# Patient Record
Sex: Female | Born: 1945
Health system: Southern US, Community
[De-identification: ages and names within clinical notes are randomized; demographics above are authoritative.]

## PROBLEM LIST (undated history)

## (undated) DIAGNOSIS — M47817 Spondylosis without myelopathy or radiculopathy, lumbosacral region: Secondary | ICD-10-CM

## (undated) DIAGNOSIS — R32 Unspecified urinary incontinence: Secondary | ICD-10-CM

## (undated) DIAGNOSIS — C801 Malignant (primary) neoplasm, unspecified: Secondary | ICD-10-CM

## (undated) DIAGNOSIS — I4891 Unspecified atrial fibrillation: Secondary | ICD-10-CM

## (undated) DIAGNOSIS — E039 Hypothyroidism, unspecified: Secondary | ICD-10-CM

## (undated) DIAGNOSIS — Z8489 Family history of other specified conditions: Secondary | ICD-10-CM

## (undated) DIAGNOSIS — I779 Disorder of arteries and arterioles, unspecified: Secondary | ICD-10-CM

## (undated) DIAGNOSIS — L814 Other melanin hyperpigmentation: Secondary | ICD-10-CM

## (undated) DIAGNOSIS — I340 Nonrheumatic mitral (valve) insufficiency: Secondary | ICD-10-CM

## (undated) DIAGNOSIS — E785 Hyperlipidemia, unspecified: Secondary | ICD-10-CM

## (undated) DIAGNOSIS — E119 Type 2 diabetes mellitus without complications: Secondary | ICD-10-CM

## (undated) DIAGNOSIS — K219 Gastro-esophageal reflux disease without esophagitis: Secondary | ICD-10-CM

## (undated) DIAGNOSIS — A419 Sepsis, unspecified organism: Secondary | ICD-10-CM

## (undated) DIAGNOSIS — N329 Bladder disorder, unspecified: Secondary | ICD-10-CM

## (undated) DIAGNOSIS — D689 Coagulation defect, unspecified: Secondary | ICD-10-CM

## (undated) DIAGNOSIS — K2960 Other gastritis without bleeding: Secondary | ICD-10-CM

## (undated) DIAGNOSIS — I480 Paroxysmal atrial fibrillation: Secondary | ICD-10-CM

## (undated) DIAGNOSIS — Z860100 Personal history of colon polyps, unspecified: Secondary | ICD-10-CM

## (undated) DIAGNOSIS — Z8719 Personal history of other diseases of the digestive system: Secondary | ICD-10-CM

## (undated) DIAGNOSIS — T4145XA Adverse effect of unspecified anesthetic, initial encounter: Secondary | ICD-10-CM

## (undated) DIAGNOSIS — I509 Heart failure, unspecified: Secondary | ICD-10-CM

## (undated) DIAGNOSIS — D509 Iron deficiency anemia, unspecified: Secondary | ICD-10-CM

## (undated) DIAGNOSIS — D6861 Antiphospholipid syndrome: Secondary | ICD-10-CM

## (undated) DIAGNOSIS — T7840XA Allergy, unspecified, initial encounter: Secondary | ICD-10-CM

## (undated) DIAGNOSIS — I2699 Other pulmonary embolism without acute cor pulmonale: Secondary | ICD-10-CM

## (undated) DIAGNOSIS — I2510 Atherosclerotic heart disease of native coronary artery without angina pectoris: Secondary | ICD-10-CM

## (undated) DIAGNOSIS — I10 Essential (primary) hypertension: Secondary | ICD-10-CM

## (undated) DIAGNOSIS — I739 Peripheral vascular disease, unspecified: Secondary | ICD-10-CM

## (undated) DIAGNOSIS — R011 Cardiac murmur, unspecified: Secondary | ICD-10-CM

## (undated) DIAGNOSIS — I493 Ventricular premature depolarization: Secondary | ICD-10-CM

## (undated) DIAGNOSIS — Z889 Allergy status to unspecified drugs, medicaments and biological substances status: Secondary | ICD-10-CM

## (undated) DIAGNOSIS — T8859XA Other complications of anesthesia, initial encounter: Secondary | ICD-10-CM

## (undated) DIAGNOSIS — I82409 Acute embolism and thrombosis of unspecified deep veins of unspecified lower extremity: Secondary | ICD-10-CM

## (undated) HISTORY — DX: Heart failure, unspecified: I50.9

## (undated) HISTORY — DX: Paroxysmal atrial fibrillation: I48.0

## (undated) HISTORY — DX: Gastro-esophageal reflux disease without esophagitis: K21.9

## (undated) HISTORY — PX: BREAST SURGERY: SHX581

## (undated) HISTORY — DX: Personal history of colonic polyps: Z86.010

## (undated) HISTORY — DX: Atherosclerotic heart disease of native coronary artery without angina pectoris: I25.10

## (undated) HISTORY — DX: Essential (primary) hypertension: I10

## (undated) HISTORY — PX: TONSILLECTOMY: SUR1361

## (undated) HISTORY — PX: ABDOMINAL HYSTERECTOMY: SHX81

## (undated) HISTORY — DX: Iron deficiency anemia, unspecified: D50.9

## (undated) HISTORY — DX: Hyperlipidemia, unspecified: E78.5

## (undated) HISTORY — PX: TOTAL KNEE ARTHROPLASTY: SHX125

## (undated) HISTORY — PX: JOINT REPLACEMENT: SHX530

## (undated) HISTORY — DX: Other pulmonary embolism without acute cor pulmonale: I26.99

## (undated) HISTORY — DX: Coagulation defect, unspecified: D68.9

## (undated) HISTORY — DX: Ventricular premature depolarization: I49.3

## (undated) HISTORY — PX: OTHER SURGICAL HISTORY: SHX169

## (undated) HISTORY — DX: Disorder of arteries and arterioles, unspecified: I77.9

## (undated) HISTORY — PX: BACK SURGERY: SHX140

## (undated) HISTORY — DX: Unspecified atrial fibrillation: I48.91

## (undated) HISTORY — DX: Unspecified urinary incontinence: R32

## (undated) HISTORY — DX: Personal history of colon polyps, unspecified: Z86.0100

## (undated) HISTORY — DX: Antiphospholipid syndrome: D68.61

## (undated) HISTORY — DX: Sepsis, unspecified organism: A41.9

## (undated) HISTORY — DX: Peripheral vascular disease, unspecified: I73.9

## (undated) HISTORY — PX: CARDIAC CATHETERIZATION: SHX172

## (undated) HISTORY — DX: Allergy status to unspecified drugs, medicaments and biological substances: Z88.9

## (undated) HISTORY — DX: Spondylosis without myelopathy or radiculopathy, lumbosacral region: M47.817

## (undated) HISTORY — DX: Allergy, unspecified, initial encounter: T78.40XA

## (undated) HISTORY — DX: Acute embolism and thrombosis of unspecified deep veins of unspecified lower extremity: I82.409

## (undated) HISTORY — DX: Type 2 diabetes mellitus without complications: E11.9

## (undated) HISTORY — PX: BREAST ENHANCEMENT SURGERY: SHX7

## (undated) HISTORY — DX: Other gastritis without bleeding: K29.60

## (undated) HISTORY — DX: Nonrheumatic mitral (valve) insufficiency: I34.0

## (undated) SURGERY — COLONOSCOPY WITH PROPOFOL
Anesthesia: Monitor Anesthesia Care

---

## 1992-10-22 DIAGNOSIS — K2970 Gastritis, unspecified, without bleeding: Secondary | ICD-10-CM | POA: Insufficient documentation

## 1992-10-22 DIAGNOSIS — K2960 Other gastritis without bleeding: Secondary | ICD-10-CM

## 1992-10-22 HISTORY — DX: Other gastritis without bleeding: K29.60

## 1998-01-25 ENCOUNTER — Encounter: Admission: RE | Admit: 1998-01-25 | Discharge: 1998-04-25 | Payer: Self-pay | Admitting: Neurosurgery

## 1998-08-13 ENCOUNTER — Encounter: Payer: Self-pay | Admitting: Neurosurgery

## 1998-08-13 ENCOUNTER — Ambulatory Visit (HOSPITAL_COMMUNITY): Admission: RE | Admit: 1998-08-13 | Discharge: 1998-08-13 | Payer: Self-pay | Admitting: Neurosurgery

## 1998-08-24 ENCOUNTER — Ambulatory Visit (HOSPITAL_COMMUNITY): Admission: RE | Admit: 1998-08-24 | Discharge: 1998-08-24 | Payer: Self-pay | Admitting: Neurosurgery

## 1998-08-24 ENCOUNTER — Encounter: Payer: Self-pay | Admitting: Neurosurgery

## 1998-10-28 ENCOUNTER — Encounter: Payer: Self-pay | Admitting: Neurosurgery

## 1998-11-01 ENCOUNTER — Encounter: Payer: Self-pay | Admitting: Neurosurgery

## 1998-11-01 ENCOUNTER — Inpatient Hospital Stay (HOSPITAL_COMMUNITY): Admission: RE | Admit: 1998-11-01 | Discharge: 1998-11-05 | Payer: Self-pay | Admitting: Neurosurgery

## 1999-08-01 ENCOUNTER — Ambulatory Visit (HOSPITAL_COMMUNITY): Admission: RE | Admit: 1999-08-01 | Discharge: 1999-08-01 | Payer: Self-pay | Admitting: Neurosurgery

## 1999-08-01 ENCOUNTER — Encounter: Payer: Self-pay | Admitting: Neurosurgery

## 1999-08-02 ENCOUNTER — Encounter: Payer: Self-pay | Admitting: Neurosurgery

## 1999-09-04 ENCOUNTER — Ambulatory Visit (HOSPITAL_COMMUNITY): Admission: RE | Admit: 1999-09-04 | Discharge: 1999-09-04 | Payer: Self-pay | Admitting: Neurosurgery

## 1999-09-04 ENCOUNTER — Encounter: Payer: Self-pay | Admitting: Neurosurgery

## 1999-09-18 ENCOUNTER — Ambulatory Visit (HOSPITAL_COMMUNITY): Admission: RE | Admit: 1999-09-18 | Discharge: 1999-09-18 | Payer: Self-pay | Admitting: Neurosurgery

## 1999-11-07 ENCOUNTER — Encounter: Payer: Self-pay | Admitting: Neurosurgery

## 1999-11-09 ENCOUNTER — Inpatient Hospital Stay (HOSPITAL_COMMUNITY): Admission: RE | Admit: 1999-11-09 | Discharge: 1999-11-12 | Payer: Self-pay | Admitting: Neurosurgery

## 1999-11-09 ENCOUNTER — Encounter: Payer: Self-pay | Admitting: Neurosurgery

## 1999-11-27 ENCOUNTER — Encounter: Admission: RE | Admit: 1999-11-27 | Discharge: 1999-11-27 | Payer: Self-pay | Admitting: Neurosurgery

## 1999-11-27 ENCOUNTER — Encounter: Payer: Self-pay | Admitting: Neurosurgery

## 2000-01-01 ENCOUNTER — Encounter: Admission: RE | Admit: 2000-01-01 | Discharge: 2000-01-01 | Payer: Self-pay | Admitting: Neurosurgery

## 2000-01-01 ENCOUNTER — Encounter: Payer: Self-pay | Admitting: Neurosurgery

## 2000-01-03 ENCOUNTER — Encounter: Payer: Self-pay | Admitting: *Deleted

## 2000-01-03 ENCOUNTER — Ambulatory Visit (HOSPITAL_COMMUNITY): Admission: RE | Admit: 2000-01-03 | Discharge: 2000-01-03 | Payer: Self-pay | Admitting: *Deleted

## 2000-01-08 ENCOUNTER — Encounter: Admission: RE | Admit: 2000-01-08 | Discharge: 2000-01-08 | Payer: Self-pay | Admitting: *Deleted

## 2000-01-10 ENCOUNTER — Emergency Department (HOSPITAL_COMMUNITY): Admission: EM | Admit: 2000-01-10 | Discharge: 2000-01-10 | Payer: Self-pay | Admitting: Emergency Medicine

## 2000-01-10 ENCOUNTER — Encounter: Payer: Self-pay | Admitting: Emergency Medicine

## 2000-02-07 ENCOUNTER — Encounter: Admission: RE | Admit: 2000-02-07 | Discharge: 2000-02-07 | Payer: Self-pay | Admitting: Neurosurgery

## 2000-02-07 ENCOUNTER — Encounter: Payer: Self-pay | Admitting: Neurosurgery

## 2000-03-22 ENCOUNTER — Other Ambulatory Visit: Admission: RE | Admit: 2000-03-22 | Discharge: 2000-03-22 | Payer: Self-pay | Admitting: Obstetrics and Gynecology

## 2000-06-10 ENCOUNTER — Encounter: Payer: Self-pay | Admitting: Neurosurgery

## 2000-06-10 ENCOUNTER — Encounter: Admission: RE | Admit: 2000-06-10 | Discharge: 2000-06-10 | Payer: Self-pay | Admitting: Neurosurgery

## 2000-06-13 ENCOUNTER — Encounter: Admission: RE | Admit: 2000-06-13 | Discharge: 2000-06-13 | Payer: Self-pay | Admitting: Neurosurgery

## 2000-06-13 ENCOUNTER — Encounter: Payer: Self-pay | Admitting: Neurosurgery

## 2000-10-30 ENCOUNTER — Encounter: Payer: Self-pay | Admitting: Neurosurgery

## 2000-10-30 ENCOUNTER — Encounter: Admission: RE | Admit: 2000-10-30 | Discharge: 2000-10-30 | Payer: Self-pay | Admitting: Neurosurgery

## 2001-01-29 ENCOUNTER — Encounter: Admission: RE | Admit: 2001-01-29 | Discharge: 2001-01-29 | Payer: Self-pay | Admitting: Neurosurgery

## 2001-01-29 ENCOUNTER — Encounter: Payer: Self-pay | Admitting: Neurosurgery

## 2001-02-04 ENCOUNTER — Encounter: Admission: RE | Admit: 2001-02-04 | Discharge: 2001-02-04 | Payer: Self-pay | Admitting: Neurosurgery

## 2001-03-05 ENCOUNTER — Encounter: Admission: RE | Admit: 2001-03-05 | Discharge: 2001-05-21 | Payer: Self-pay | Admitting: Anesthesiology

## 2001-03-27 ENCOUNTER — Ambulatory Visit (HOSPITAL_COMMUNITY): Admission: RE | Admit: 2001-03-27 | Discharge: 2001-03-27 | Payer: Self-pay | Admitting: *Deleted

## 2001-04-11 ENCOUNTER — Other Ambulatory Visit: Admission: RE | Admit: 2001-04-11 | Discharge: 2001-04-11 | Payer: Self-pay | Admitting: Obstetrics and Gynecology

## 2001-05-30 ENCOUNTER — Encounter: Admission: RE | Admit: 2001-05-30 | Discharge: 2001-06-21 | Payer: Self-pay | Admitting: Anesthesiology

## 2001-06-13 ENCOUNTER — Encounter: Admission: RE | Admit: 2001-06-13 | Discharge: 2001-06-13 | Payer: Self-pay | Admitting: Neurosurgery

## 2001-08-27 ENCOUNTER — Encounter: Admission: RE | Admit: 2001-08-27 | Discharge: 2001-11-25 | Payer: Self-pay | Admitting: Neurosurgery

## 2002-05-06 ENCOUNTER — Ambulatory Visit (HOSPITAL_COMMUNITY): Admission: RE | Admit: 2002-05-06 | Discharge: 2002-05-06 | Payer: Self-pay | Admitting: *Deleted

## 2002-05-13 ENCOUNTER — Ambulatory Visit (HOSPITAL_COMMUNITY): Admission: RE | Admit: 2002-05-13 | Discharge: 2002-05-13 | Payer: Self-pay | Admitting: *Deleted

## 2002-09-22 ENCOUNTER — Inpatient Hospital Stay (HOSPITAL_COMMUNITY): Admission: AD | Admit: 2002-09-22 | Discharge: 2002-09-25 | Payer: Self-pay | Admitting: Cardiology

## 2002-09-29 ENCOUNTER — Encounter: Payer: Self-pay | Admitting: Neurosurgery

## 2002-09-29 ENCOUNTER — Inpatient Hospital Stay (HOSPITAL_COMMUNITY): Admission: RE | Admit: 2002-09-29 | Discharge: 2002-10-06 | Payer: Self-pay | Admitting: Neurosurgery

## 2002-10-01 ENCOUNTER — Encounter: Payer: Self-pay | Admitting: Neurosurgery

## 2002-10-01 ENCOUNTER — Encounter: Payer: Self-pay | Admitting: Cardiology

## 2003-02-25 ENCOUNTER — Encounter: Payer: Self-pay | Admitting: *Deleted

## 2003-02-25 ENCOUNTER — Inpatient Hospital Stay (HOSPITAL_COMMUNITY): Admission: EM | Admit: 2003-02-25 | Discharge: 2003-02-26 | Payer: Self-pay | Admitting: Emergency Medicine

## 2004-09-26 ENCOUNTER — Ambulatory Visit (HOSPITAL_COMMUNITY): Admission: RE | Admit: 2004-09-26 | Discharge: 2004-09-26 | Payer: Self-pay | Admitting: Neurosurgery

## 2004-09-30 ENCOUNTER — Ambulatory Visit (HOSPITAL_COMMUNITY): Admission: RE | Admit: 2004-09-30 | Discharge: 2004-09-30 | Payer: Self-pay | Admitting: Neurosurgery

## 2004-10-06 ENCOUNTER — Ambulatory Visit: Payer: Self-pay | Admitting: Cardiovascular Disease

## 2004-10-12 ENCOUNTER — Ambulatory Visit: Payer: Self-pay

## 2004-10-21 ENCOUNTER — Ambulatory Visit: Payer: Self-pay | Admitting: Cardiology

## 2004-10-21 ENCOUNTER — Observation Stay (HOSPITAL_COMMUNITY): Admission: EM | Admit: 2004-10-21 | Discharge: 2004-10-22 | Payer: Self-pay | Admitting: *Deleted

## 2004-10-31 ENCOUNTER — Ambulatory Visit: Payer: Self-pay | Admitting: Critical Care Medicine

## 2004-10-31 ENCOUNTER — Inpatient Hospital Stay (HOSPITAL_COMMUNITY): Admission: RE | Admit: 2004-10-31 | Discharge: 2004-11-08 | Payer: Self-pay | Admitting: Neurosurgery

## 2004-11-08 ENCOUNTER — Ambulatory Visit: Payer: Self-pay | Admitting: Physical Medicine & Rehabilitation

## 2004-11-08 ENCOUNTER — Encounter (HOSPITAL_COMMUNITY)
Admission: RE | Admit: 2004-11-08 | Discharge: 2004-11-13 | Payer: Self-pay | Admitting: Physical Medicine & Rehabilitation

## 2004-11-08 ENCOUNTER — Inpatient Hospital Stay
Admission: RE | Admit: 2004-11-08 | Discharge: 2004-11-13 | Payer: Self-pay | Admitting: Physical Medicine & Rehabilitation

## 2004-11-24 ENCOUNTER — Ambulatory Visit: Payer: Self-pay | Admitting: Cardiovascular Disease

## 2004-12-04 ENCOUNTER — Ambulatory Visit: Payer: Self-pay | Admitting: Critical Care Medicine

## 2004-12-14 ENCOUNTER — Ambulatory Visit (HOSPITAL_COMMUNITY): Admission: RE | Admit: 2004-12-14 | Discharge: 2004-12-14 | Payer: Self-pay | Admitting: Critical Care Medicine

## 2005-01-04 ENCOUNTER — Ambulatory Visit: Payer: Self-pay | Admitting: Cardiovascular Disease

## 2005-01-29 ENCOUNTER — Ambulatory Visit: Payer: Self-pay | Admitting: Critical Care Medicine

## 2005-04-18 ENCOUNTER — Other Ambulatory Visit: Admission: RE | Admit: 2005-04-18 | Discharge: 2005-04-18 | Payer: Self-pay | Admitting: Addiction Medicine

## 2005-06-26 ENCOUNTER — Ambulatory Visit: Payer: Self-pay | Admitting: Cardiovascular Disease

## 2005-07-12 ENCOUNTER — Ambulatory Visit: Payer: Self-pay | Admitting: Cardiovascular Disease

## 2005-08-19 ENCOUNTER — Emergency Department (HOSPITAL_COMMUNITY): Admission: EM | Admit: 2005-08-19 | Discharge: 2005-08-19 | Payer: Self-pay | Admitting: Emergency Medicine

## 2005-11-09 ENCOUNTER — Ambulatory Visit (HOSPITAL_COMMUNITY): Admission: RE | Admit: 2005-11-09 | Discharge: 2005-11-09 | Payer: Self-pay | Admitting: Neurosurgery

## 2005-11-19 ENCOUNTER — Inpatient Hospital Stay (HOSPITAL_COMMUNITY): Admission: EM | Admit: 2005-11-19 | Discharge: 2005-11-20 | Payer: Self-pay | Admitting: Emergency Medicine

## 2005-11-21 ENCOUNTER — Inpatient Hospital Stay (HOSPITAL_COMMUNITY): Admission: EM | Admit: 2005-11-21 | Discharge: 2005-11-27 | Payer: Self-pay | Admitting: *Deleted

## 2006-01-17 ENCOUNTER — Ambulatory Visit: Payer: Self-pay | Admitting: Cardiovascular Disease

## 2006-06-27 ENCOUNTER — Other Ambulatory Visit: Admission: RE | Admit: 2006-06-27 | Discharge: 2006-06-27 | Payer: Self-pay | Admitting: Obstetrics and Gynecology

## 2006-08-12 ENCOUNTER — Ambulatory Visit: Payer: Self-pay | Admitting: Cardiovascular Disease

## 2006-08-23 ENCOUNTER — Encounter: Admission: RE | Admit: 2006-08-23 | Discharge: 2006-08-23 | Payer: Self-pay | Admitting: Neurosurgery

## 2006-10-17 ENCOUNTER — Ambulatory Visit (HOSPITAL_COMMUNITY): Admission: RE | Admit: 2006-10-17 | Discharge: 2006-10-17 | Payer: Self-pay | Admitting: *Deleted

## 2006-10-30 ENCOUNTER — Ambulatory Visit: Payer: Self-pay | Admitting: Critical Care Medicine

## 2006-11-06 ENCOUNTER — Ambulatory Visit: Payer: Self-pay | Admitting: Cardiovascular Disease

## 2006-11-12 ENCOUNTER — Inpatient Hospital Stay (HOSPITAL_COMMUNITY): Admission: RE | Admit: 2006-11-12 | Discharge: 2006-11-16 | Payer: Self-pay | Admitting: Neurosurgery

## 2006-11-12 ENCOUNTER — Ambulatory Visit: Payer: Self-pay | Admitting: Critical Care Medicine

## 2006-12-27 ENCOUNTER — Ambulatory Visit: Payer: Self-pay | Admitting: Critical Care Medicine

## 2007-03-03 ENCOUNTER — Ambulatory Visit: Payer: Self-pay | Admitting: Cardiovascular Disease

## 2007-09-01 ENCOUNTER — Ambulatory Visit: Payer: Self-pay | Admitting: Cardiovascular Disease

## 2008-07-05 ENCOUNTER — Ambulatory Visit: Payer: Self-pay | Admitting: Cardiovascular Disease

## 2008-11-11 ENCOUNTER — Ambulatory Visit: Payer: Self-pay | Admitting: Cardiovascular Disease

## 2008-11-25 ENCOUNTER — Ambulatory Visit: Payer: Self-pay

## 2008-11-25 ENCOUNTER — Ambulatory Visit: Payer: Self-pay | Admitting: Critical Care Medicine

## 2008-11-29 DIAGNOSIS — E785 Hyperlipidemia, unspecified: Secondary | ICD-10-CM | POA: Insufficient documentation

## 2009-02-01 ENCOUNTER — Ambulatory Visit: Payer: Self-pay | Admitting: Cardiovascular Disease

## 2009-02-01 ENCOUNTER — Inpatient Hospital Stay (HOSPITAL_COMMUNITY): Admission: RE | Admit: 2009-02-01 | Discharge: 2009-02-10 | Payer: Self-pay | Admitting: Neurosurgery

## 2009-02-01 ENCOUNTER — Ambulatory Visit: Payer: Self-pay | Admitting: *Deleted

## 2009-02-08 ENCOUNTER — Encounter: Payer: Self-pay | Admitting: Cardiovascular Disease

## 2009-02-25 ENCOUNTER — Inpatient Hospital Stay (HOSPITAL_COMMUNITY): Admission: AD | Admit: 2009-02-25 | Discharge: 2009-03-11 | Payer: Self-pay | Admitting: Neurosurgery

## 2009-03-01 ENCOUNTER — Ambulatory Visit: Payer: Self-pay | Admitting: *Deleted

## 2009-03-18 DIAGNOSIS — I4891 Unspecified atrial fibrillation: Secondary | ICD-10-CM

## 2009-03-18 DIAGNOSIS — I10 Essential (primary) hypertension: Secondary | ICD-10-CM | POA: Insufficient documentation

## 2009-03-18 DIAGNOSIS — J45909 Unspecified asthma, uncomplicated: Secondary | ICD-10-CM | POA: Insufficient documentation

## 2009-03-18 DIAGNOSIS — K219 Gastro-esophageal reflux disease without esophagitis: Secondary | ICD-10-CM

## 2009-03-18 DIAGNOSIS — M47817 Spondylosis without myelopathy or radiculopathy, lumbosacral region: Secondary | ICD-10-CM | POA: Insufficient documentation

## 2009-03-18 DIAGNOSIS — I82409 Acute embolism and thrombosis of unspecified deep veins of unspecified lower extremity: Secondary | ICD-10-CM | POA: Insufficient documentation

## 2009-03-18 DIAGNOSIS — Z86711 Personal history of pulmonary embolism: Secondary | ICD-10-CM | POA: Insufficient documentation

## 2009-03-22 ENCOUNTER — Telehealth: Payer: Self-pay | Admitting: Cardiovascular Disease

## 2009-04-05 ENCOUNTER — Telehealth: Payer: Self-pay | Admitting: Cardiovascular Disease

## 2009-04-12 ENCOUNTER — Telehealth: Payer: Self-pay | Admitting: Cardiovascular Disease

## 2009-06-13 ENCOUNTER — Ambulatory Visit: Payer: Self-pay | Admitting: Cardiovascular Disease

## 2009-07-27 ENCOUNTER — Ambulatory Visit: Payer: Self-pay | Admitting: Cardiovascular Disease

## 2009-08-05 ENCOUNTER — Ambulatory Visit: Payer: Self-pay | Admitting: Cardiovascular Disease

## 2009-08-08 LAB — CONVERTED CEMR LAB
AST: 24 units/L (ref 0–37)
Albumin: 3.8 g/dL (ref 3.5–5.2)
Alkaline Phosphatase: 77 units/L (ref 39–117)
Bilirubin, Direct: 0.1 mg/dL (ref 0.0–0.3)
Total Protein: 6.7 g/dL (ref 6.0–8.3)

## 2009-08-31 ENCOUNTER — Encounter (INDEPENDENT_AMBULATORY_CARE_PROVIDER_SITE_OTHER): Payer: Self-pay | Admitting: *Deleted

## 2009-09-01 ENCOUNTER — Telehealth: Payer: Self-pay | Admitting: Cardiovascular Disease

## 2009-09-02 LAB — CONVERTED CEMR LAB
Direct LDL: 98.6 mg/dL
Free T4: 0.8 ng/dL (ref 0.6–1.6)
HDL: 38.2 mg/dL — ABNORMAL LOW (ref >39.00)
Total CHOL/HDL Ratio: 4
Triglycerides: 259 mg/dL — ABNORMAL HIGH (ref 0.0–149.0)

## 2009-09-12 ENCOUNTER — Encounter: Payer: Self-pay | Admitting: Nurse Practitioner

## 2009-09-16 ENCOUNTER — Emergency Department (HOSPITAL_COMMUNITY): Admission: EM | Admit: 2009-09-16 | Discharge: 2009-09-16 | Payer: Self-pay | Admitting: Emergency Medicine

## 2009-10-19 ENCOUNTER — Telehealth (INDEPENDENT_AMBULATORY_CARE_PROVIDER_SITE_OTHER): Payer: Self-pay | Admitting: *Deleted

## 2009-10-20 ENCOUNTER — Encounter: Payer: Self-pay | Admitting: Nurse Practitioner

## 2009-11-12 ENCOUNTER — Encounter: Payer: Self-pay | Admitting: Cardiovascular Disease

## 2009-11-15 ENCOUNTER — Ambulatory Visit: Payer: Self-pay | Admitting: Cardiovascular Disease

## 2009-11-15 DIAGNOSIS — R011 Cardiac murmur, unspecified: Secondary | ICD-10-CM

## 2009-11-15 DIAGNOSIS — R0989 Other specified symptoms and signs involving the circulatory and respiratory systems: Secondary | ICD-10-CM

## 2009-11-15 DIAGNOSIS — I2510 Atherosclerotic heart disease of native coronary artery without angina pectoris: Secondary | ICD-10-CM

## 2009-11-23 ENCOUNTER — Ambulatory Visit (HOSPITAL_COMMUNITY): Admission: RE | Admit: 2009-11-23 | Discharge: 2009-11-23 | Payer: Self-pay | Admitting: Internal Medicine

## 2009-11-23 ENCOUNTER — Encounter: Payer: Self-pay | Admitting: Cardiovascular Disease

## 2009-11-23 ENCOUNTER — Ambulatory Visit: Payer: Self-pay | Admitting: Cardiovascular Disease

## 2009-11-23 ENCOUNTER — Ambulatory Visit: Payer: Self-pay

## 2009-11-29 ENCOUNTER — Telehealth: Payer: Self-pay | Admitting: Cardiovascular Disease

## 2010-02-02 ENCOUNTER — Telehealth (INDEPENDENT_AMBULATORY_CARE_PROVIDER_SITE_OTHER): Payer: Self-pay | Admitting: *Deleted

## 2010-05-25 ENCOUNTER — Encounter: Payer: Self-pay | Admitting: Nurse Practitioner

## 2010-05-25 ENCOUNTER — Ambulatory Visit: Payer: Self-pay | Admitting: Cardiovascular Disease

## 2010-05-31 ENCOUNTER — Telehealth: Payer: Self-pay | Admitting: Internal Medicine

## 2010-06-01 ENCOUNTER — Encounter: Payer: Self-pay | Admitting: Nurse Practitioner

## 2010-06-01 ENCOUNTER — Ambulatory Visit: Payer: Self-pay | Admitting: Gastroenterology

## 2010-06-01 DIAGNOSIS — M549 Dorsalgia, unspecified: Secondary | ICD-10-CM | POA: Insufficient documentation

## 2010-06-01 DIAGNOSIS — R1084 Generalized abdominal pain: Secondary | ICD-10-CM

## 2010-06-01 DIAGNOSIS — D6859 Other primary thrombophilia: Secondary | ICD-10-CM

## 2010-06-01 DIAGNOSIS — D508 Other iron deficiency anemias: Secondary | ICD-10-CM | POA: Insufficient documentation

## 2010-06-02 LAB — CONVERTED CEMR LAB: HCT: 29.5 % — ABNORMAL LOW (ref 36.0–46.0)

## 2010-06-07 ENCOUNTER — Telehealth: Payer: Self-pay | Admitting: Gastroenterology

## 2010-06-07 ENCOUNTER — Ambulatory Visit: Payer: Self-pay | Admitting: Gastroenterology

## 2010-06-07 ENCOUNTER — Ambulatory Visit (HOSPITAL_COMMUNITY): Admission: RE | Admit: 2010-06-07 | Discharge: 2010-06-07 | Payer: Self-pay | Admitting: Gastroenterology

## 2010-06-15 ENCOUNTER — Encounter: Payer: Self-pay | Admitting: Gastroenterology

## 2010-06-16 ENCOUNTER — Telehealth: Payer: Self-pay | Admitting: Gastroenterology

## 2010-07-05 ENCOUNTER — Ambulatory Visit: Payer: Self-pay | Admitting: Gastroenterology

## 2010-07-05 LAB — CONVERTED CEMR LAB
OCCULT 1: NEGATIVE
OCCULT 2: NEGATIVE
OCCULT 5: NEGATIVE

## 2010-08-30 ENCOUNTER — Ambulatory Visit: Payer: Self-pay | Admitting: Gastroenterology

## 2010-11-12 ENCOUNTER — Encounter: Payer: Self-pay | Admitting: *Deleted

## 2010-11-23 NOTE — Progress Notes (Signed)
Summary: Approval for Nexium bid   Phone Note From Pharmacy Call back at [facility] Approval   Summary of Call: Nexium approved from 06/15/2010- 06/15/2011 for Nexium two times a day  Will send approval down to be scanned in.  Initial call taken by: Merri Ray CMA [facility]),  June 16, 2010 1:32 PM

## 2010-11-23 NOTE — Assessment & Plan Note (Signed)
Summary: Per Vergie Living, patient changing providers.  PN and CM aware./lg   Visit Type:  Follow-up Primary Provider:  Dr. Ronne Binning  CC:  sob and thinks it's asthma..edema/feet.Marland Kitchendenies any cp .  History of Present Illness: 65 yo female with HTN, hyperlipidemia, anti-phospholipid antibody syndrome, GERD, asthma, CAD s/p cath 2003 with moderate non-obstructive disease, chronic back pain and paroxysmal atrial fibrillation here today to transfer cardiology care. She has been previously followed by Dr. [name] in our office.  She has a hypercoagulable state but has not been managed on coumadin. She has allergies to heparin and Lovenox. She is seen by a hematologist at [facility] (Dr. Graylon Gunning) and he has told her that she does not need coumadin. She had been on amiodarone in the past but this was stopped at the last visit in August. She has had no palpitations or awareness of irregularity of her heart rhythm. She has had some SOB but this seems to be at baseline. She thinks this is from her baseline. She has had no lower extremity edema but she does have chronic problems with pain in her feet from Raynaud's phenomenon.   Most recent stress test 2/10 with normal LV function , EF 51%, no ischemia. Last echo in record 2004 with normal LV size and function with trace MR and TR.   Records reviewed today during office visit.   Current Medications (verified): 1)  Metoprolol Tartrate 50 Mg Tabs (Metoprolol Tartrate) .... Take One Tablet By Mouth Twice A Day 2)  Gnp Omeprazole 20 Mg Tbec (Omeprazole) .... Two Times A Day 3)  Centrum Silver  Chew (Multiple Vitamins-Minerals) .... Once Daily 4)  Clarinex-D 12 Hour 2.5-120 Mg Xr12h-Tab (Desloratadine-Pseudoephedrine) .... Two Times A Day 5)  Glucosamine-Chondroitin 750-600 Mg Tabs (Glucosamine-Chondroitin) .... Two Times A Day 6)  Colace 100 Mg Caps (Docusate Sodium) .Marland Kitchen.. 1 Cap As Needed 7)  Qvar 80 Mcg/act  Aers (Beclomethasone Dipropionate) .... Two  Puffs Three Times A  Day 8)  Duragesic-100 100 Mcg/hr Pt72 (Fentanyl) .... Change Every 72hours 9)  Lipitor 20 Mg Tabs (Atorvastatin Calcium) .... One Daily 10)  Neurontin 600 Mg Tabs (Gabapentin) .... One At Bedtime 11)  Arthrotec 75 75-200 Mg-Mcg Tabs (Diclofenac-Misoprostol) .... One By Mouth Two Times A Day 12)  Singulair 10 Mg  Tabs (Montelukast Sodium) .... One By Mouth Daily 13)  Furosemide 20 Mg Tabs (Furosemide) .... Take One Tablet By Mouth Daily As Needed Swelling 14)  Astelin 137 Mcg/spray Soln (Azelastine Hcl) .... As Needed 15)  Aspirin 81 Mg Tbec (Aspirin) .... Take One Tablet By Mouth Daily 16)  Nasacort Aq 55 Mcg/act Aers (Triamcinolone Acetonide(Nasal)) .... As Needed 17)  Stool Softener 100 Mg Caps (Docusate Sodium) .... As Directed 18)  Valium 5 Mg Tabs (Diazepam) .Marland Kitchen.. 1 Tab By Mouth At Bedtime 19)  Calcium-Vitamin D .... 1 Tab By Mouth Two Times A Day 20)  Cyclobenzaprine Hcl 10 Mg Tabs (Cyclobenzaprine Hcl) .Marland Kitchen.. 1 Tab By Mouth Three Times A Day As Needed 21)  Lidoderm 5 % Ptch (Lidocaine) .... As Needed 22)  Nitroglycerin 0.4 Mg Subl (Nitroglycerin) .... One Tablet Under Tongue Every 5 Minutes As Needed For Chest Pain---May Repeat Times Three 23)  Dilaudid 4 Mg Tabs (Hydromorphone Hcl) .... As Needed 24)  Skelaxin 800 Mg Tabs (Metaxalone) .... As Needed  Allergies: 1)  ! Heparin 2)  ! * Lovenox 3)  ! Betadine 4)  ! Indocin  Past History:  Past Medical History: HYPERTENSION (ICD-401.9) SPONDYLOSIS, LUMBAR (ICD-721.3)  GERD (ICD-530.81) ASTHMA (ICD-493.90) PULMONARY EMBOLISM (ICD-415.19) DVT (ICD-453.40) PAROXYSMAL ATRIAL FIBRILLATION (ICD-427.31)-not on coumadin therapy HYPERLIPIDEMIA (ICD-272.4) CORONARY HEART DISEASE (ICD-414.00) 80% ostial D1 and moderate 80%  mid circ  by cath 2003 EXTRINSIC ASTHMA, UNSPECIFIED (ICD-493.00) Asthma moderate persistent     -normal spirometry 2010         -Normal CXR 1/08 Antiphospholipid syndrome with hypercoagulable state Coronary Heart  Disease Prior PE related to back surgery with prior coumadin use now off Allergy to heparin/lovenox   Past Surgical History: Back surgery    -multiple (12) cervical and lumbar thoracic spine surgery Total knee replacement left knee Cleft lip and palate repair at age 89 Hysterectomy Spina Bifida repair at age 89  Family History: Reviewed history from 11/09/2009 and no changes required. Father deceased at age 78 with massive MI  Mother deceased viral infection 6 brothers and 3 sisters. One brother with tetrology of fallot died after 3 days. One sister with CAD/MI. Family History of Cancer: colon  Social History: Reviewed history from 03/18/2009 and no changes required.  The patient lives in [place] with her husband.  She has had miscarriages but no children.  She is disabled.   She has no smoking, EtOH, or illicit drug use.   However,  she does take opioids currently for pain relief.  She takes no herbal   medications.  She has a heart healthy diet.  She exercises nearly every  day doing water aerobics, but is unable to exercise outside of water.      Review of Systems       The patient complains of fatigue and shortness of breath.  The patient denies malaise, fever, weight gain/loss, vision loss, decreased hearing, hoarseness, chest pain, palpitations, prolonged cough, wheezing, sleep apnea, coughing up blood, abdominal pain, blood in stool, nausea, vomiting, diarrhea, heartburn, incontinence, blood in urine, muscle weakness, joint pain, leg swelling, rash, skin lesions, headache, fainting, dizziness, depression, anxiety, enlarged lymph nodes, easy bruising or bleeding, and environmental allergies.    Vital Signs:  Patient profile:   65 year old female Height:      66 inches Weight:      173 pounds BMI:     28.02 Pulse rate:   78 / minute Pulse rhythm:   irregular BP sitting:   132 / 80  (left arm) Cuff size:   regular  Vitals Entered By: Danielle Rankin, CMA (November 15, 2009  11:51 AM)  Physical Exam  General:  General: Well developed, well nourished, NAD HEENT: OP clear, mucus membranes moist SKIN: warm, dry Neuro: No focal deficits Musculoskeletal: Muscle strength 5/5 all ext Psychiatric: Mood and affect normal Neck: No JVD, Faint left carotid bruit, no thyromegaly, no lymphadenopathy. Lungs:Clear bilaterally, no wheezes, rhonci, crackles CV: RRR with II/VI systolic  murmur,  No gallops rubs Abdomen: soft, NT, ND, BS present Extremities: No edema, pulses 1-2+. Both feet erythematous.     EKG  Procedure date:  11/15/2009  Findings:      NSR, rate 78 bpm. Non-specific ST abnormalities.   Impression & Recommendations:  Problem # 1:  HYPERTENSION (ICD-401.9) Well controlled. Continue current therapy.   Her updated medication list for this problem includes:    Metoprolol Tartrate 50 Mg Tabs (Metoprolol tartrate) .Marland Kitchen... Take one tablet by mouth twice a day    Furosemide 20 Mg Tabs (Furosemide) .Marland Kitchen... Take one tablet by mouth daily as needed swelling    Aspirin 81 Mg Tbec (Aspirin) .Marland Kitchen... Take one tablet by mouth daily  Problem # 2:  CAD, NATIVE VESSEL (ICD-414.01)  Stable. No angina. Normal stress test 2/10. Her last cath was in 2003 and she was medically managed for 70% mid Circumflex lesion and 80% ostial Diagonal. Continue medical management.  Her updated medication list for this problem includes:    Metoprolol Tartrate 50 Mg Tabs (Metoprolol tartrate) .Marland Kitchen... Take one tablet by mouth twice a day    Aspirin 81 Mg Tbec (Aspirin) .Marland Kitchen... Take one tablet by mouth daily    Nitroglycerin 0.4 Mg Subl (Nitroglycerin) ..... One tablet under tongue every 5 minutes as needed for chest pain---may repeat times three  Orders: Echocardiogram (Echo)  Problem # 3:  CAROTID BRUIT, LEFT (ICD-785.9)  Carotid artery dopplers to assess.   Orders: Carotid Duplex (Carotid Duplex)  Problem # 4:  MURMUR (ICD-785.2) Check echo. Last echo 2004 with normal LV size and  funcion, Trace MR and TR.   Her updated medication list for this problem includes:    Metoprolol Tartrate 50 Mg Tabs (Metoprolol tartrate) .Marland Kitchen... Take one tablet by mouth twice a day    Furosemide 20 Mg Tabs (Furosemide) .Marland Kitchen... Take one tablet by mouth daily as needed swelling    Nitroglycerin 0.4 Mg Subl (Nitroglycerin) ..... One tablet under tongue every 5 minutes as needed for chest pain---may repeat times three  Patient Instructions: 1)  Your physician recommends that you schedule a follow-up appointment in: 6 months 2)  Your physician has requested that you have a carotid duplex. This test is an ultrasound of the carotid arteries in your neck. It looks at blood flow through these arteries that supply the brain with blood. Allow one hour for this exam. There are no restrictions or special instructions. 3)  Your physician has requested that you have an echocardiogram.  Echocardiography is a painless test that uses sound waves to create images of your heart. It provides your doctor with information about the size and shape of your heart and how well your heart's chambers and valves are working.  This procedure takes approximately one hour. There are no restrictions for this procedure.

## 2010-11-23 NOTE — Assessment & Plan Note (Signed)
Summary: 6 month rov/sl   Visit Type:  6 months follow up Primary Latoya [name]:  Dr. Ronne Binning  CC:  Chest tightness. Pt. under a lot stress.  History of Present Illness: 65 yo female with HTN, hyperlipidemia, anti-phospholipid antibody syndrome, GERD, asthma, CAD s/p cath 2003 with moderate non-obstructive disease (80% Circ, 80% ostial Diagonal), chronic back pain and paroxysmal atrial fibrillation here today for cardiac follow up. She has been previously followed by Dr. [name] in our office.  She has a hypercoagulable state but has not been managed on coumadin. She has allergies to heparin and Lovenox. She is seen by a hematologist at [facility] (Dr. Graylon Gunning) and he has told her that she does not need coumadin. She had been on amiodarone in the past but this was stopped at the last visit in August 2010 by Dr. [name]. She has had no palpitations or awareness of irregularity of her heart rhythm. She has been diagnosed with hypothyroidism in the past 6 months and has been on Synthroid. She has more energy since she has been on the thyroid replacement. She has had no lower extremity edema but she does have chronic problems with pain in her feet from Raynaud's phenomenon and ongoing sciatica. She is seen by Thyra Breed for pain management.    Most recent stress test 2/10 with normal LV function , EF 51%, no ischemia. Last echo in February 2011 with normal LV size, mild LVH, normal LV systolic function with EF of 55%, mild MR with mildly thickened mitral valve leaflets. Carotid artery dopplers February 2011 with mild RICA stenosis and moderate LICA disease.   She tells me that she has been having occasional chest tightness when she argues with her husband. No SOB. No exertional chest pressure. She has had three asthma attacks over the last few months. She is feeling much better.    Current Medications (verified): 1)  Metoprolol Tartrate 50 Mg Tabs (Metoprolol Tartrate) .... Take One Tablet By Mouth Twice A  Day 2)  Gnp Omeprazole 20 Mg Tbec (Omeprazole) .... Two Times A Day 3)  Centrum Silver  Chew (Multiple Vitamins-Minerals) .... Once Daily 4)  Clarinex-D 12 Hour 2.5-120 Mg Xr12h-Tab (Desloratadine-Pseudoephedrine) .... Two Times A Day 5)  Glucosamine-Chondroitin 750-600 Mg Tabs (Glucosamine-Chondroitin) .... Two Times A Day 6)  Colace 100 Mg Caps (Docusate Sodium) .Marland Kitchen.. 1 Cap As Needed 7)  Furosemide 20 Mg Tabs (Furosemide) .... Take One Tablet By Mouth Daily As Needed Swelling 8)  Astelin 137 Mcg/spray Soln (Azelastine Hcl) .... As Needed 9)  Aspirin 81 Mg Tbec (Aspirin) .... Take One Tablet By Mouth Daily 10)  Calcium-Vitamin D .... 1 Tab By Mouth Two Times A Day 11)  Cyclobenzaprine Hcl 10 Mg Tabs (Cyclobenzaprine Hcl) .Marland Kitchen.. 1 Tab By Mouth Three Times A Day As Needed 12)  Lidoderm 5 % Ptch (Lidocaine) .... As Needed 13)  Nitroglycerin 0.4 Mg Subl (Nitroglycerin) .... One Tablet Under Tongue Every 5 Minutes As Needed For Chest Pain---May Repeat Times Three 14)  Dilaudid 4 Mg Tabs (Hydromorphone Hcl) .... As Needed 15)  Skelaxin 800 Mg Tabs (Metaxalone) .... As Needed 16)  Lipitor 20 Mg Tabs (Atorvastatin Calcium) .... Take One Tablet By Mouth Daily. 17)  Arthrotec 75-200 Mg-Mcg Tabs (Diclofenac-Misoprostol) .... Take 1 Tablet By Mouth Two Times A Day 18)  Calcium Carbonate-Vitamin D 600-400 Mg-Unit  Tabs (Calcium Carbonate-Vitamin D) .... Take 1 Tablet By Mouth Two Times A Day 19)  Duragesic-100 100 Mcg/hr Pt72 (Fentanyl) .... Change Every 2 Days  20)  Neurontin 600 Mg Tabs (Gabapentin) .... As Needed 21)  Qvar 80 Mcg/act Aers (Beclomethasone Dipropionate) .... 2 Puffs 3 X A Day 22)  Singulair 10 Mg Tabs (Montelukast Sodium) .... Take 1 Tablet By Mouth Once A Day 23)  Synthroid 50 Mcg Tabs (Levothyroxine Sodium) .... Take 1 Tablet By Mouth Once A Day 24)  Valium 5 Mg Tabs (Diazepam) .... Take 1 Tab By Mouth At Bedtime  Allergies: 1)  ! Heparin 2)  ! * Lovenox 3)  ! Betadine 4)  !  Indocin  Past History:  Past Medical History: Reviewed history from 11/15/2009 and no changes required. HYPERTENSION (ICD-401.9) SPONDYLOSIS, LUMBAR (ICD-721.3) GERD (ICD-530.81) ASTHMA (ICD-493.90) PULMONARY EMBOLISM (ICD-415.19) DVT (ICD-453.40) PAROXYSMAL ATRIAL FIBRILLATION (ICD-427.31)-not on coumadin therapy HYPERLIPIDEMIA (ICD-272.4) CORONARY HEART DISEASE (ICD-414.00) 80% ostial D1 and moderate 80%  mid circ  by cath 2003 EXTRINSIC ASTHMA, UNSPECIFIED (ICD-493.00) Asthma moderate persistent     -normal spirometry 2010         -Normal CXR 1/08 Antiphospholipid syndrome with hypercoagulable state Coronary Heart Disease Prior PE related to back surgery with prior coumadin use now off Allergy to heparin/lovenox   Social History: Reviewed history from 11/15/2009 and no changes required.  The patient lives in [place] with her husband.  She has had miscarriages but no children.  She is disabled.   She has no smoking, EtOH, or illicit drug use.   However,  she does take opioids currently for pain relief.  She takes no herbal   medications.  She has a heart healthy diet.  She exercises nearly every  day doing water aerobics, but is unable to exercise outside of water.      Review of Systems       The patient complains of chest pain and joint pain.  The patient denies fatigue, malaise, fever, weight gain/loss, vision loss, decreased hearing, hoarseness, palpitations, shortness of breath, prolonged cough, wheezing, sleep apnea, coughing up blood, abdominal pain, blood in stool, nausea, vomiting, diarrhea, heartburn, incontinence, blood in urine, muscle weakness, leg swelling, rash, skin lesions, headache, fainting, dizziness, depression, anxiety, enlarged lymph nodes, easy bruising or bleeding, and environmental allergies.    Vital Signs:  Patient profile:   65 year old female Height:      66 inches Weight:      173 pounds BMI:     28.02 Pulse rate:   93 / minute Pulse  rhythm:   regular Resp:     18 per minute BP sitting:   150 / 88  (right arm) Cuff size:   large  Vitals Entered By: Vikki Ports (May 25, 2010 10:17 AM)  Physical Exam  General:  General: Well developed, well nourished, NAD HEENT: OP clear, mucus membranes moist Psychiatric: Mood and affect normal Neck: No JVD, left  carotid bruit, no thyromegaly, no lymphadenopathy. Lungs:Clear bilaterally, no wheezes, rhonci, crackles CV: RRR no murmurs, gallops rubs Abdomen: soft, NT, ND, BS present Extremities: No edema, pulses 2+.    EKG  Procedure date:  05/25/2010  Findings:      NSR, rate93 bpm. Normal eKG  Carotid Doppler  Procedure date:  11/23/2009  Findings:      RICA 0-39% stenosis LICA 40-59% stenosis  Echocardiogram  Procedure date:  11/23/2009  Findings:      - Left ventricle: The cavity size was normal. Wall thickness was       increased in a pattern of mild LVH. Systolic function was normal.  The estimated ejection fraction was in the range of 55% to 60%.     - Mitral valve: Moderately thickened and calcified leaflet tips Mild       regurgitation.     - Left atrium: The atrium was mildly dilated.     - Atrial septum: No defect or patent foramen ovale was identified.  Impression & Recommendations:  Problem # 1:  CAD, NATIVE VESSEL (ICD-414.01) She has moderate CAD by cath 2003 and has been medically managed. No exertional angina. Continue beta blocker and ASA. Normal LV function by echo earlier this year.    Her updated medication list for this problem includes:    Metoprolol Tartrate 50 Mg Tabs (Metoprolol tartrate) .Marland Kitchen... Take one tablet by mouth twice a day    Aspirin 81 Mg Tbec (Aspirin) .Marland Kitchen... Take one tablet by mouth daily    Nitroglycerin 0.4 Mg Subl (Nitroglycerin) ..... One tablet under tongue every 5 minutes as needed for chest pain---may repeat times three  Problem # 2:  CAROTID ARTERY DISEASE (ICD-433.10) Moderate LICA disease. Repeat  carotid dopplers in 6 months (1 year from last study)  Her updated medication list for this problem includes:    Aspirin 81 Mg Tbec (Aspirin) .Marland Kitchen... Take one tablet by mouth daily  Problem # 3:  PAROXYSMAL ATRIAL FIBRILLATION (ICD-427.31) No signs of atrial fibrillation. Continue current therapy. She has not been on coumadin in the past.  She does have antiphospholipid antibody syndrome but her hematologist has not wanted to start coumadin. If she has any recurrence of atrial fibrillation, we would need to consider coumadin therapy. Her CHADS2 score  1.   Her updated medication list for this problem includes:    Metoprolol Tartrate 50 Mg Tabs (Metoprolol tartrate) .Marland Kitchen... Take one tablet by mouth twice a day    Aspirin 81 Mg Tbec (Aspirin) .Marland Kitchen... Take one tablet by mouth daily  Patient Instructions: 1)  Your physician recommends that you schedule a follow-up appointment in: 6 months 2)  Your physician has requested that you have a carotid duplex. This test is an ultrasound of the carotid arteries in your neck. It looks at blood flow through these arteries that supply the brain with blood. Allow one hour for this exam. There are no restrictions or special instructions. To be done in 6 months

## 2010-11-23 NOTE — Medication Information (Signed)
Summary: Prior [employer] for Nexium/[employer]  Prior [employer] for Nexium/[employer]   Imported By: Sherian Rein 06/21/2010 14:07:44  _____________________________________________________________________  External Attachment:    Type:   Image     Comment:   External Document

## 2010-11-23 NOTE — Letter (Signed)
Summary: Patient Medications  Patient Medications   Imported By: Kassie Mends 12/28/2009 09:03:50  _____________________________________________________________________  External Attachment:    Type:   Image     Comment:   External Document

## 2010-11-23 NOTE — Progress Notes (Signed)
Summary: TRIAGE-? re rx  Phone Note Call from Patient Call back at Home Phone [phone]   Caller: Patient Call For: Arlyce Dice Reason for Call: Talk to Nurse Summary of Call: Patient wants to speak to nurse because she had a procedure at hosp today and he wrote her rx for Nexium for once a day and she normally takes it twice a day. Initial call taken by: Tawni Levy,  June 07, 2010 4:59 PM  Follow-up for Phone Call        DR.Wilmoth Rasnic--Does she need to take Nexium once daily or two times a day?  Follow-up by: Laureen Ochs LPN,  June 08, 2010 8:05 AM  Additional Follow-up for Phone Call Additional follow up Details #1::        twice a day Additional Follow-up by: Louis Meckel MD,  June 08, 2010 11:57 AM    Additional Follow-up for Phone Call Additional follow up Details #2::    Pt. aware that a corrected script sent to her pharmacy. Pt. to keep scheduled office visit.Pt. instructed to call back as needed.  Follow-up by: Laureen Ochs LPN,  June 08, 2010 12:09 PM  New/Updated Medications: NEXIUM 40 MG CPDR (ESOMEPRAZOLE MAGNESIUM) take 1 tab twice daily Prescriptions: NEXIUM 40 MG CPDR (ESOMEPRAZOLE MAGNESIUM) take 1 tab twice daily  #60 x 6   Entered by:   Laureen Ochs LPN   Authorized by:   Louis Meckel MD   Signed by:   Laureen Ochs LPN on 09/81/1914   Method used:   Electronically to        [id number] (retail)       [address]       [place], [place]  [address]       Ph: [phone] or [phone]       Fax: [phone]   RxID:   [id number]

## 2010-11-23 NOTE — Progress Notes (Signed)
Summary: Discontinuing medication  ---- Converted from flag ---- ---- 11/16/2009 8:48 AM, Ledon Snare, RN wrote:   ---- 11/08/2009 3:17 PM, Roxy Horseman wrote: Harriett Sine, Please read the attached phone note and provide documentation about any conversations that you might have had with this patient about discontinuing her amnioderone.  Dear Candise Bowens, as I recall, pt was late and Dr [name] told me to cancel--I went to lobby and explained to pt who stated all she wanted to know was could she stop her Amiordarone--I went back to lobby and stated she could D/C  ami--per OK  Dr Alver Sorrow is all I can remember  Thanks, Harriett Sine T ------------------------------

## 2010-11-23 NOTE — Letter (Signed)
Summary: EGD Instructions  [facility]  [address] [place], [place] [address]   Phone: [phone]  Fax: [phone]       Latoya [name]    June 28, 1946    MRN: [id number]       Procedure Day /Date: 06-07-10     Arrival Time: 8:00 AM      Procedure Time: 9:00 AM     Location of Procedure:                     X     _ [facility] ( Outpatient Registration)  PREPARATION FOR ENDOSCOPY   On  06-07-10 THE DAY OF THE PROCEDURE:  1.   No solid foods, milk or milk products are allowed after midnight the night before your procedure.  2.   Do not drink anything colored red or purple.  Avoid juices with pulp.  No orange juice.  3.  You may drink clear liquids until 5:00 AM, which is 4 hours before your procedure.                                                                                                CLEAR LIQUIDS INCLUDE: Water Jello Ice Popsicles Tea (sugar ok, no milk/cream) Powdered fruit flavored drinks Coffee (sugar ok, no milk/cream) Gatorade Juice: apple, white grape, white cranberry  Lemonade Clear bullion, consomm, broth Carbonated beverages (any kind) Strained chicken noodle soup Hard Candy   MEDICATION INSTRUCTIONS  Unless otherwise instructed, you should take regular prescription medications with a small sip of water as early as possible the morning of your procedure.          OTHER INSTRUCTIONS  You will need a responsible adult at least 64 years of age to accompany you and drive you home.   This person must remain in the waiting room during your procedure.  Wear loose fitting clothing that is easily removed.  Leave jewelry and other valuables at home.  However, you may wish to bring a book to read or an iPod/MP3 player to listen to music as you wait for your procedure to start.  Remove all body piercing jewelry and leave at home.  Total time from sign-in until discharge is approximately 2-3 hours.  You should go home  directly after your procedure and rest.  You can resume normal activities the day after your procedure.  The day of your procedure you should not:   Drive   Make legal decisions   Operate machinery   Drink alcohol   Return to work  You will receive specific instructions about eating, activities and medications before you leave.    The above instructions have been reviewed and explained to me by   _______________________    I fully understand and can verbalize these instructions _____________________________ Date _________

## 2010-11-23 NOTE — Miscellaneous (Signed)
  Clinical Lists Changes  Medications: Added new medication of HYOMAX-SR 0.375 MG XR12H-TAB (HYOSCYAMINE SULFATE) take 1 tab two times a day as needed abdominal pain - Signed Rx of HYOMAX-SR 0.375 MG XR12H-TAB (HYOSCYAMINE SULFATE) take 1 tab two times a day as needed abdominal pain;  #15 x 2;  Signed;  Entered by: Louis Meckel MD;  Authorized by: Louis Meckel MD;  Method used: Electronically to [id number] [address], [place], [place]  [address], Ph: [phone] or [phone], Fax: [phone]    Prescriptions: HYOMAX-SR 0.375 MG XR12H-TAB (HYOSCYAMINE SULFATE) take 1 tab two times a day as needed abdominal pain  #15 x 2   Entered and Authorized by:   Louis Meckel MD   Signed by:   Louis Meckel MD on 06/07/2010   Method used:   Electronically to        [facility] (retail)       [address]       [place], [place]  [address]       Ph: [phone] or [phone]       Fax: [phone]   RxID:   [id number]

## 2010-11-23 NOTE — Progress Notes (Signed)
Summary: Doc of the day  Phone Note From Other Clinic   Caller: Pat @ Dr. Thea Silversmith [phone] Call For: Dr. Juanda Chance Summary of Call: upper gastic pain, acute anemia, HX of PVD...requesting appt. stat Initial call taken by: Karna Christmas,  May 31, 2010 3:38 PM  Follow-up for Phone Call        LM for Pat to call.  Lupita Leash Surface RN  May 31, 2010 3:50 PM  talked with Dennie Bible.  Pt hgb 9.2.  Pt is not in distress,  Would like pt to be seen sometime this week.  Appt sch for pt to see Rozetta Nunnery NP on Firday 06/02/10. Follow-up by: Ashok Cordia RN,  May 31, 2010 4:00 PM     Appended Document: Doc of the day Appt changed to Thurs 3:00 with Rozetta Nunnery, NP.  Pat notified.

## 2010-11-23 NOTE — Progress Notes (Signed)
Summary: Questions regarding diet, exercise, med refills  Phone Note Outgoing Call Call back at Home Phone [phone]   Call placed to: Patient Summary of Call: I called pt to give her results of carotid doppler and echo.  Results given.  Pt with questions regarding diet information.  Pt. has access to computer so I gave her website of [employer].  I offered pt the option of out pt nutrition counseling but she does not want to do this at present.  She will research diet information on her own and contact us or primary care MD if she would like to arrange appointment with dietician.  Pt also with questions regarding exercise and what type of exercises she can do.  Pt. presently exercises about 3 days a week in water.  I told pt that from a cardiac standpoint she should continue to exercise as she has been doing and that it is OK to exercise more often if she would like.  I instructed pt she should follow recommendations and restrictions from her back doctor as to what additonal types of exercise she should do.  Pt with questions regarding how to get refills of medications. I told pt to have pharmacy call us when she needs refills but that if she is having problems with this and is running low on medications she should call to office and we could assist with refills. Initial call taken by: Dossie Arbour, RN, BSN,  November 29, 2009 10:02 AM

## 2010-11-23 NOTE — Procedures (Signed)
Summary: Small Bowel Endoscopy  Patient: Rica Heather Note: All result statuses are Final unless otherwise noted.  Tests: (1) Small Bowel Endoscopy (SBE)  SBE Small Bowel Endoscopy                             DONE (C)     [facility]     [address] [place], [place]  [address]           OPERATIVE PROCEDURE REPORT           PATIENT:  Rosselyn, Martha  MR#:  [id number]     BIRTHDATE:  09-03-1946, 64 yrs. old  GENDER:  female           ENDOSCOPIST:  Barbette Hair. Arlyce Dice, MD     ASSISTANT:           PROCEDURE DATE:  06/07/2010     PROCEDURE:  EGD, Small Bowel Endoscopy     ASA CLASS:  Class II     INDICATIONS:  1) abdominal pain  2) iron deficiency anemia           MEDICATIONS:   Fentanyl 100 mcg, Versed 10 mg, glycopyrrolate     (Robinal) 0.2 mg IV     TOPICAL ANESTHETIC:  Cetacaine Spray           DESCRIPTION OF PROCEDURE:   After the risks benefits and     alternatives of the procedure were thoroughly explained, informed     consent was obtained.  The  endoscope was introduced through the     mouth and advanced to the proximal jejunum, without limitations.     The instrument was slowly withdrawn as the mucosa was fully     examined.     <<PROCEDUREIMAGES>>           The upper, middle, and distal third of the esophagus were     carefully inspected and no abnormalities were noted. The z-line     was well seen at the GEJ. The endoscope was pushed into the fundus     which was normal including a retroflexed view. The antrum, first     and second part of the duodenum were unremarkable.  The duodenal     bulb was normal in appearance, as was the postbulbar duodenum (see     image1, image2, image3, image5, image6, and image7).     Retroflexed views revealed no abnormalities.    The scope was then     withdrawn from the patient and the procedure terminated.           COMPLICATIONS:  None           ENDOSCOPIC IMPRESSION:     1) Normal EGD     2) Normal proximal  jejunum (corrrection)           No obvious source for abdominal  pain or GI blood loss     RECOMMENDATIONS:     1) hemmoccult stools 1 week     2) continue current meds     3) hyomax as needed for abdominal  pain     4) OV 3-4 weeks           REPEAT EXAM:  No           ______________________________     Barbette Hair. Arlyce Dice, MD           CC:  Arlys John  McKenzie, M.D.           n.     REVISED:  08/30/2010 02:59 PM     eSIGNED:   Barbette Hair. Kaplan at 08/30/2010 02:59 PM           [place], [place], [phone]  Note: An exclamation mark (!) indicates a result that was not dispersed into the flowsheet. Document Creation Date: 08/30/2010 2:59 PM _______________________________________________________________________  (1) Order result status: Final Collection or observation date-time: 06/07/2010 09:25 Requested date-time:  Receipt date-time:  Reported date-time:  Referring Physician:   Ordering Physician: Melvia Heaps [phone]) Specimen Source:  Source: Launa Grill Order Number: [id number] Lab site:

## 2010-11-23 NOTE — Assessment & Plan Note (Signed)
Summary: Abd pain, anemia/ dfs   History of Present Illness Visit Type: Initial Visit Primary GI MD: Lina Sar MD Primary Provider: Shary Decamp, MD Chief Complaint: anemia, abdominal pain x 1 month ( 1 week severe) History of Present Illness:   No weight/ height because patient canot stand.   Patient is a 65 year old female with multiple medical problems including, but not limited to: CAD, antiphospholipid syndrome, s/p multiple blood clots, asthma, GERD, chronic back pain with multiple back surgeries, osteoarthritis, history of atrial fibrillation.  She is on 21 medications.  She is a former patient of Dr. Virginia Rochester. Patient referred here by Dr. Ronne Binning (PCP) for a 2-3 week history of upper abdominal pain and anemia. She describes periumbilcal pain and mid upper abdominal pain associated with belching and bloating. Pain initially intermittent but lately constant. It is worse if stomach totally empty. No nausea or vomiting. She is generally constipated but lately stools have been loose. No rectal bleeding  Gives history of hiatal hernia and UGIB secondary to PUD (remote). She talks about being severly anemic but not requiring blood transfusions except during times of post-op anemia.  States workup was done by Dr. Virginia Rochester. I see she did have EGDs and colonoscopies by Dr. Virginia Rochester but the listed indications were pain and nausea. She did have what sounds to be a capsule endo. as well.  Patient takes takes Nexium twice daily.  No significant GERD symptoms unless she bends over at the waist. She is also on Arthrotec (for years).   One month ago had pain in left leg. Saw her neurosurgeon and told leg pain was from arthritis. Patient feels her abdominal pain and left lower extremity are related somehow as they always occur simultaneously.     GI Review of Systems    Reports abdominal pain, belching, bloating, and  loss of appetite.     Location of  Abdominal pain: upper abdomen.    Denies acid reflux, chest  pain, dysphagia with liquids, dysphagia with solids, heartburn, nausea, vomiting, vomiting blood, weight loss, and  weight gain.      Reports constipation, diarrhea, and  diverticulosis.     Denies anal fissure, black tarry stools, change in bowel habit, fecal incontinence, heme positive stool, hemorrhoids, irritable bowel syndrome, jaundice, light color stool, liver problems, rectal bleeding, and  rectal pain.    Current Medications (verified): 1)  Metoprolol Tartrate 50 Mg Tabs (Metoprolol Tartrate) .... Take One Tablet By Mouth Twice A Day 2)  Gnp Omeprazole 20 Mg Tbec (Omeprazole) .... Two Times A Day 3)  Centrum Silver  Chew (Multiple Vitamins-Minerals) .... Once Daily 4)  Clarinex-D 12 Hour 2.5-120 Mg Xr12h-Tab (Desloratadine-Pseudoephedrine) .... Two Times A Day 5)  Glucosamine-Chondroitin 750-600 Mg Tabs (Glucosamine-Chondroitin) .... Two Times A Day 6)  Colace 100 Mg Caps (Docusate Sodium) .Marland Kitchen.. 1 Cap As Needed 7)  Furosemide 20 Mg Tabs (Furosemide) .... Take One Tablet By Mouth Daily As Needed Swelling 8)  Astelin 137 Mcg/spray Soln (Azelastine Hcl) .... As Needed 9)  Aspirin 81 Mg Tbec (Aspirin) .... Take One Tablet By Mouth Daily Hold 10)  Calcium-Vitamin D .... 1 Tab By Mouth Two Times A Day 11)  Cyclobenzaprine Hcl 10 Mg Tabs (Cyclobenzaprine Hcl) .Marland Kitchen.. 1 Tab By Mouth Three Times A Day As Needed 12)  Lidoderm 5 % Ptch (Lidocaine) .... As Needed 13)  Nitroglycerin 0.4 Mg Subl (Nitroglycerin) .... One Tablet Under Tongue Every 5 Minutes As Needed For Chest Pain---May Repeat Times Three 14)  Dilaudid 4 Mg Tabs (Hydromorphone Hcl) .... As Needed 15)  Skelaxin 800 Mg Tabs (Metaxalone) .... As Needed 16)  Lipitor 20 Mg Tabs (Atorvastatin Calcium) .... Take One Tablet By Mouth Daily. 17)  Arthrotec 75-200 Mg-Mcg Tabs (Diclofenac-Misoprostol) .... Take 1 Tablet By Mouth Two Times A Day Hold 18)  Calcium Carbonate-Vitamin D 600-400 Mg-Unit  Tabs (Calcium Carbonate-Vitamin D) .... Take 1  Tablet By Mouth Two Times A Day 19)  Duragesic-100 100 Mcg/hr Pt72 (Fentanyl) .... Change Every 2 Days 20)  Neurontin 600 Mg Tabs (Gabapentin) .... As Needed 21)  Qvar 80 Mcg/act Aers (Beclomethasone Dipropionate) .... 2 Puffs 3 X A Day 22)  Singulair 10 Mg Tabs (Montelukast Sodium) .... Take 1 Tablet By Mouth Once A Day 23)  Synthroid 50 Mcg Tabs (Levothyroxine Sodium) .... Take 1 Tablet By Mouth Once A Day 24)  Valium 5 Mg Tabs (Diazepam) .... Take 1 Tab By Mouth At Bedtime  Allergies: 1)  ! Heparin 2)  ! * Lovenox 3)  ! Betadine 4)  ! Indocin 5)  ! Augmentin  Past History:  Past Medical History: Reviewed history from 11/15/2009 and no changes required. HYPERTENSION (ICD-401.9) SPONDYLOSIS, LUMBAR (ICD-721.3) GERD (ICD-530.81) ASTHMA (ICD-493.90) PULMONARY EMBOLISM (ICD-415.19) DVT (ICD-453.40) PAROXYSMAL ATRIAL FIBRILLATION (ICD-427.31)-not on coumadin therapy HYPERLIPIDEMIA (ICD-272.4) CORONARY HEART DISEASE (ICD-414.00) 80% ostial D1 and moderate 80%  mid circ  by cath 2003 EXTRINSIC ASTHMA, UNSPECIFIED (ICD-493.00) Asthma moderate persistent     -normal spirometry 2010         -Normal CXR 1/08 Antiphospholipid syndrome with hypercoagulable state Coronary Heart Disease Prior PE related to back surgery with prior coumadin use now off Allergy to heparin/lovenox   Past Surgical History: Reviewed history from 11/15/2009 and no changes required. Back surgery    -multiple (12) cervical and lumbar thoracic spine surgery Total knee replacement left knee Cleft lip and palate repair at age 83 Hysterectomy Spina Bifida repair at age 83  Family History: Reviewed history from 11/15/2009 and no changes required. Father deceased at age 821 with massive MI  Mother deceased viral infection 6 brothers and 3 sisters. One brother with tetrology of fallot died after 3 days. One sister with CAD/MI. Family History of Cancer: colon  Social History: Reviewed history from 11/15/2009 and  no changes required.  The patient lives in [place] with her husband.  She has had miscarriages but no children.  She is disabled.   She has no smoking, EtOH, or illicit drug use.   However,  she does take opioids currently for pain relief.  She takes no herbal   medications.  She has a heart healthy diet.  She exercises nearly every  day doing water aerobics, but is unable to exercise outside of water.      Review of Systems       The patient complains of allergy/sinus, anemia, arthritis/joint pain, back pain, heart murmur, shortness of breath, and urine leakage.  The patient denies anxiety-new, blood in urine, breast changes/lumps, change in vision, confusion, cough, coughing up blood, depression-new, fainting, fatigue, fever, headaches-new, hearing problems, heart rhythm changes, itching, menstrual pain, muscle pains/cramps, night sweats, nosebleeds, pregnancy symptoms, skin rash, sleeping problems, sore throat, swelling of feet/legs, swollen lymph glands, thirst - excessive , urination - excessive , urination changes/pain, vision changes, and voice change.    Vital Signs:  Patient profile:   65 year old female Pulse rate:   126 / minute Pulse rhythm:   regular BP sitting:   140 / 86  (  left arm) Cuff size:   regular  Vitals Entered By: June McMurray CMA Duncan Dull) (June 01, 2010 3:14 PM)  Physical Exam  General:  Well developed, well nourished, no acute distress. Head:  Normocephalic and atraumatic. Eyes:  Conjunctiva pink, no icterus.  Mouth:  White plaque on tongue.  Neck:  no obvious masses  Lungs:  Clear throughout to auscultation. Heart:  Regular rate and rhythm; no murmurs, rubs,  or bruits. Abdomen:  Abdomen soft, nondistended. Diffuse abdominal tenderness. No obvious masses or hepatomegaly.Normal bowel sounds.  Rectal:  No external / internal masses apprecitated. Scant light brown stool, heme negative. Neurologic:  Alert and  oriented x4;  grossly normal  neurologically. Skin:  Intact without significant lesions or rashes. Cervical Nodes:  No significant cervical adenopathy. Psych:  Alert and cooperative. Normal mood and affect.   Impression & Recommendations:  Problem # 1:  ABDOMINAL PAIN -GENERALIZED (ICD-789.07) Assessment New Periumbilical / mid upper abdomen in setting of NSAID use and microcytic anemia. Need to exclude PUD.  The patient will be scheduled for an EGD with biopsies ( if indicated).  The risks and benefits of the procedure, as well as alternatives were discussed with the patient and she agrees to proceed.  Orders: ZEGD (ZEGD)  Problem # 2:  ANEMIA, IRON DEFICIENCY, MICROCYTIC (ICD-280.8) Assessment: Deteriorated MCV 67.8. Her hgb has dropped from 11.8 in November 2010 to 9.2 .  In setting of NSAIDS need to exclude PUD. Also, may have AVMs. Patient gives history of anemia and apparently had capsule endo at some point. She had 2 EGDs and 2 colonoscopies by Dr. Virginia Rochester between 2002 and 2007 but they were were abdominal pain, vomiting and history of colon polyps. Her last colonoscopy in Dec. 2007 was normal except for diverticulosis and small internal hemorrhoids.   Problem # 3:  GERD (ICD-530.81) Assessment: Comment Only Asymptomatic for most part on twice daily PPI  Problem # 4:  HYPERTENSION (ICD-401.9) Assessment: Comment Only  Problem # 5:  ASTHMA (ICD-493.90) Assessment: Comment Only  Problem # 6:  CAROTID ARTERY DISEASE (ICD-433.10)  Problem # 7:  PRIMARY HYPERCOAGULABLE STATE (ICD-289.81) Assessment: Comment Only Antiphospholipid syndrome. See hematologist but apparently he doesn't want to start Coumadin.   Other Orders: TLB-Hematocrit (Hct) (85013-HCT) TLB-Hemoglobin (Hgb) (85018-HGB)  Patient Instructions: 1)  Please go to lab, basement level. 2)  We scheduled the Endoscopy with Dr Arlyce Dice at [facility]. 3)  Directions and brochure provided. 4)  [facility] Patient Information Guide  given to patient. 5)  Copy sent to :  Shary Decamp, MD

## 2010-11-23 NOTE — Miscellaneous (Signed)
  Clinical Lists Changes  Medications: Removed medication of GNP OMEPRAZOLE 20 MG TBEC (OMEPRAZOLE) two times a day Added new medication of NEXIUM 40 MG CPDR (ESOMEPRAZOLE MAGNESIUM) take 1 tab once daily - Signed Added new medication of CARAFATE 1 GM/10ML  SUSP (SUCRALFATE) take 1 gm qid - Signed Rx of NEXIUM 40 MG CPDR (ESOMEPRAZOLE MAGNESIUM) take 1 tab once daily;  #30 x 2;  Signed;  Entered by: Louis Meckel MD;  Authorized by: Louis Meckel MD;  Method used: Electronically to [facility] [address] [place], [place]  [address], Ph: [phone] or [phone], Fax: [phone] Rx of CARAFATE 1 GM/10ML  SUSP (SUCRALFATE) take 1 gm qid;  #1 month x 2;  Signed;  Entered by: Louis Meckel MD;  Authorized by: Louis Meckel MD;  Method used: Electronically to [facility] [address], [place], [place]  [address], Ph: [phone] or [phone], Fax: [phone]    Prescriptions: CARAFATE 1 GM/10ML  SUSP (SUCRALFATE) take 1 gm qid  #1 month x 2   Entered and Authorized by:   Louis Meckel MD   Signed by:   Louis Meckel MD on 06/07/2010   Method used:   Electronically to        [facility] (retail)       [address]       [place], [place]  [address]       Ph: [phone] or [phone]       Fax: [phone]   RxID:   [id number] NEXIUM 40 MG CPDR (ESOMEPRAZOLE MAGNESIUM) take 1 tab once daily  #30 x 2   Entered and Authorized by:   Louis Meckel MD   Signed by:   Louis Meckel MD on 06/07/2010   Method used:   Electronically to        [facility] (retail)       [address]       [place], [place]  [address]       Ph: [phone] or [phone]       Fax: [phone]   RxID:   [id number]

## 2010-11-23 NOTE — Assessment & Plan Note (Signed)
Summary: f/u from procedure--ch.   History of Present Illness Visit Type: Follow-up Visit Primary GI MD: Lina Sar MD Primary Provider: Shary Decamp, MD Requesting Provider: na Chief Complaint: F/u from Endo. Pt c/o still having acid reflux but states otherwise feeling better and denies any other GI complaints  History of Present Illness:   Latoya [name] has returned for followup of her iron deficiency anemia.  Hemoccults in September, 2011 were negative.  Upper endoscopy and small bowel enteroscopy were also unremarkable.  She feels well and  has no GI complaints.  She was recently told that her hemoglobin increased  1 gram.  She continues on iron supplements,  Arthrotec and Nexium.   GI Review of Systems    Reports acid reflux.      Denies abdominal pain, belching, bloating, chest pain, dysphagia with liquids, dysphagia with solids, heartburn, loss of appetite, nausea, vomiting, vomiting blood, weight loss, and  weight gain.        Denies anal fissure, black tarry stools, change in bowel habit, constipation, diarrhea, diverticulosis, fecal incontinence, heme positive stool, hemorrhoids, irritable bowel syndrome, jaundice, light color stool, liver problems, rectal bleeding, and  rectal pain.    Current Medications (verified): 1)  Metoprolol Tartrate 50 Mg Tabs (Metoprolol Tartrate) .... Take One Tablet By Mouth Twice A Day 2)  Centrum Silver  Chew (Multiple Vitamins-Minerals) .... Once Daily 3)  Clarinex-D 12 Hour 2.5-120 Mg Xr12h-Tab (Desloratadine-Pseudoephedrine) .... Two Times A Day 4)  Glucosamine-Chondroitin 750-600 Mg Tabs (Glucosamine-Chondroitin) .... Two Times A Day 5)  Colace 100 Mg Caps (Docusate Sodium) .Marland Kitchen.. 1 Cap As Needed 6)  Furosemide 20 Mg Tabs (Furosemide) .... Take One Tablet By Mouth Daily As Needed Swelling 7)  Astelin 137 Mcg/spray Soln (Azelastine Hcl) .... As Needed 8)  Aspirin 81 Mg Tbec (Aspirin) .... Take One Tablet By Mouth Daily Hold 9)  Calcium-Vitamin  D .... 1 Tab By Mouth Two Times A Day 10)  Cyclobenzaprine Hcl 10 Mg Tabs (Cyclobenzaprine Hcl) .Marland Kitchen.. 1 Tab By Mouth Three Times A Day As Needed 11)  Lidoderm 5 % Ptch (Lidocaine) .... As Needed 12)  Nitroglycerin 0.4 Mg Subl (Nitroglycerin) .... One Tablet Under Tongue Every 5 Minutes As Needed For Chest Pain---May Repeat Times Three 13)  Dilaudid 4 Mg Tabs (Hydromorphone Hcl) .... As Needed 14)  Skelaxin 800 Mg Tabs (Metaxalone) .... As Needed 15)  Lipitor 20 Mg Tabs (Atorvastatin Calcium) .... Take One Tablet By Mouth Daily. 16)  Arthrotec 75-200 Mg-Mcg Tabs (Diclofenac-Misoprostol) .... Take 1 Tablet By Mouth Two Times A Day Hold 17)  Calcium Carbonate-Vitamin D 600-400 Mg-Unit  Tabs (Calcium Carbonate-Vitamin D) .... Take 1 Tablet By Mouth Two Times A Day 18)  Duragesic-100 100 Mcg/hr Pt72 (Fentanyl) .... Change Every 2 Days 19)  Neurontin 600 Mg Tabs (Gabapentin) .... As Needed 20)  Qvar 80 Mcg/act Aers (Beclomethasone Dipropionate) .... 2 Puffs 3 X A Day 21)  Singulair 10 Mg Tabs (Montelukast Sodium) .... Take 1 Tablet By Mouth Once A Day 22)  Synthroid 50 Mcg Tabs (Levothyroxine Sodium) .... Take 1 Tablet By Mouth Once A Day 23)  Valium 5 Mg Tabs (Diazepam) .... Take 1 Tab By Mouth At Bedtime 24)  Hyomax-Sr 0.375 Mg Xr12h-Tab (Hyoscyamine Sulfate) .... Take 1 Tab Two Times A Day As Needed Abdominal Pain 25)  Nexium 40 Mg Cpdr (Esomeprazole Magnesium) .... Take 1 Tab Twice Daily  Allergies (verified): 1)  ! Heparin 2)  ! * Lovenox 3)  ! Betadine 4)  !  Indocin 5)  ! Augmentin  Past History:  Past Medical History: HYPERTENSION (ICD-401.9) SPONDYLOSIS, LUMBAR (ICD-721.3) GERD (ICD-530.81) ASTHMA (ICD-493.90) PULMONARY EMBOLISM (ICD-415.19) DVT (ICD-453.40) PAROXYSMAL ATRIAL FIBRILLATION (ICD-427.31)-not on coumadin therapy HYPERLIPIDEMIA (ICD-272.4) CORONARY HEART DISEASE (ICD-414.00) 80% ostial D1 and moderate 80%  mid circ  by cath 2003 EXTRINSIC ASTHMA, UNSPECIFIED  (ICD-493.00) Asthma moderate persistent     -normal spirometry 2010         -Normal CXR 1/08 Antiphospholipid syndrome with hypercoagulable state Coronary Heart Disease Prior PE related to back surgery with prior coumadin use now off Allergy to heparin/lovenox  Erosive Gastritis 1994 Anemia--Iron Def  Past Surgical History: Reviewed history from 11/15/2009 and no changes required. Back surgery    -multiple (12) cervical and lumbar thoracic spine surgery Total knee replacement left knee Cleft lip and palate repair at age 77 Hysterectomy Spina Bifida repair at age 89  Family History: Father deceased at age 64 with massive MI  Mother deceased viral infection 6 brothers and 3 sisters. One brother with tetrology of fallot died after 3 days. One sister with CAD/MI. Family History of Colon Cancer: Sister   Social History: Reviewed history from 11/15/2009 and no changes required.  The patient lives in [place] with her husband.  She has had miscarriages but no children.  She is disabled.   She has no smoking, EtOH, or illicit drug use.   However,  she does take opioids currently for pain relief.  She takes no herbal   medications.  She has a heart healthy diet.  She exercises nearly every  day doing water aerobics, but is unable to exercise outside of water.      Review of Systems       The patient complains of arthritis/joint pain, back pain, and fatigue.  The patient denies allergy/sinus, anemia, anxiety-new, blood in urine, breast changes/lumps, change in vision, confusion, cough, coughing up blood, depression-new, fainting, fever, headaches-new, hearing problems, heart murmur, heart rhythm changes, itching, menstrual pain, muscle pains/cramps, night sweats, nosebleeds, pregnancy symptoms, shortness of breath, skin rash, sleeping problems, sore throat, swelling of feet/legs, swollen lymph glands, thirst - excessive , urination - excessive , urination changes/pain, urine leakage, vision  changes, and voice change.    Vital Signs:  Patient profile:   65 year old female Height:      66 inches Weight:      154 pounds BMI:     24.95 BSA:     1.79 Pulse rate:   104 / minute Pulse rhythm:   regular BP sitting:   128 / 72  (left arm) Cuff size:   regular  Vitals Entered By: Ok Anis CMA (August 30, 2010 2:35 PM)   Impression & Recommendations:  Problem # 1:  ANEMIA, IRON DEFICIENCY, MICROCYTIC (ICD-280.8)  Etiology has not been determined.  Given her prior workup from several years ago I suspect that she could have AVMs.   Recommendations #1 colonoscopy; if negative I would consider capsule endoscopy.  Sedation with MAC for further procedures because of her chronic narcotic use   Orders: Colonoscopy (Colon)  Problem # 2:  CORONARY HEART DISEASE (ICD-414.00) Assessment: Comment Only  Problem # 3:  PRIMARY HYPERCOAGULABLE STATE (ICD-289.81) Assessment: Comment Only  Patient Instructions: 1)  Copy sent to : Shary Decamp, MD 2)  Your colonoscopy with Propoful is scheduled on 11/15/2010 at 8am 3)  We are sending in your MoviPrep to your pharmacy today 4)  Colonoscopy and Flexible Sigmoidoscopy brochure given.  5)  Conscious Sedation brochure given.  6)  The medication list was reviewed and reconciled.  All changed / newly prescribed medications were explained.  A complete medication list was provided to the patient / caregiver. Prescriptions: MOVIPREP 100 GM  SOLR (PEG-KCL-NACL-NASULF-NA ASC-C) As per prep instructions.  #1 x 0   Entered by:   Merri Ray CMA (AAMA)   Authorized by:   Louis Meckel MD   Signed by:   Merri Ray CMA (AAMA) on 08/30/2010   Method used:   Electronically to        [id number] ([facility]       [address]       [place], [place]  [address]       Ph: [phone] or [phone]       Fax: [phone]   RxID:   [id number]

## 2010-11-23 NOTE — Procedures (Signed)
Summary: Instructions for procedure/[place]  Instructions for procedure/[place]   Imported By: Sherian Rein 06/05/2010 12:39:56  _____________________________________________________________________  External Attachment:    Type:   Image     Comment:   External Document

## 2010-11-23 NOTE — Letter (Signed)
Summary: [facility] Instructions  Latoya [name]  [address] [place], [place] [address]   Phone: [phone]  Fax: [phone]       Latoya [name]    January 10, 1946    MRN: [id number]        Procedure Day /Date:WEDNESDAY 11/15/2010     Arrival Time:7:30AM     Procedure Time:8:00AM     Location of Procedure:                    X   [facility] (4th Floor)                        PREPARATION FOR COLONOSCOPY WITH MOVIPREP                          WITH PROPOFUL Starting 5 days prior to your procedure1/20/2012 do not eat nuts, seeds, popcorn, corn, beans, peas,  salads, or any raw vegetables.  Do not take any fiber supplements (e.g. Metamucil, Citrucel, and Benefiber).  THE DAY BEFORE YOUR PROCEDURE         DATE: 11/14/2010  DAY: TUES  1.  Drink clear liquids the entire day-NO SOLID FOOD  2.  Do not drink anything colored red or purple.  Avoid juices with pulp.  No orange juice.  3.  Drink at least 64 oz. (8 glasses) of fluid/clear liquids during the day to prevent dehydration and help the prep work efficiently.  CLEAR LIQUIDS INCLUDE: Water Jello Ice Popsicles Tea (sugar ok, no milk/cream) Powdered fruit flavored drinks Coffee (sugar ok, no milk/cream) Gatorade Juice: apple, white grape, white cranberry  Lemonade Clear bullion, consomm, broth Carbonated beverages (any kind) Strained chicken noodle soup Hard Candy                             4.  In the morning, mix first dose of MoviPrep solution:    Empty 1 Pouch A and 1 Pouch B into the disposable container    Add lukewarm drinking water to the top line of the container. Mix to dissolve    Refrigerate (mixed solution should be used within 24 hrs)  5.  Begin drinking the prep at 5:00 p.m. The MoviPrep container is divided by 4 marks.   Every 15 minutes drink the solution down to the next mark (approximately 8 oz) until the full liter is complete.   6.  Follow completed prep with 16 oz of  clear liquid of your choice (Nothing red or purple).  Continue to drink clear liquids until bedtime.  7.  Before going to bed, mix second dose of MoviPrep solution:    Empty 1 Pouch A and 1 Pouch B into the disposable container    Add lukewarm drinking water to the top line of the container. Mix to dissolve    Refrigerate  THE DAY OF YOUR PROCEDURE      DATE: 11/15/2009 DAY: WED  Beginning at 3a.m. (5 hours before procedure):         1. Every 15 minutes, drink the solution down to the next mark (approx 8 oz) until the full liter is complete.  2. Follow completed prep with 16 oz. of clear liquid of your choice.    3. You may drink clear liquids until 6AM (2 HOURS BEFORE PROCEDURE).   MEDICATION INSTRUCTIONS  Unless otherwise instructed, you should take regular  prescription medications with a small sip of water   as early as possible the morning of your procedure.          OTHER INSTRUCTIONS  You will need a responsible adult at least 64 years of age to accompany you and drive you home.   This person must remain in the waiting room during your procedure.  Wear loose fitting clothing that is easily removed.  Leave jewelry and other valuables at home.  However, you may wish to bring a book to read or  an iPod/MP3 player to listen to music as you wait for your procedure to start.  Remove all body piercing jewelry and leave at home.  Total time from sign-in until discharge is approximately 2-3 hours.  You should go home directly after your procedure and rest.  You can resume normal activities the  day after your procedure.  The day of your procedure you should not:   Drive   Make legal decisions   Operate machinery   Drink alcohol   Return to work  You will receive specific instructions about eating, activities and medications before you leave.    The above instructions have been reviewed and explained to me by   _______________________    I fully  understand and can verbalize these instructions _____________________________ Date _________

## 2010-11-23 NOTE — Letter (Signed)
Summary: [facility]   Imported By: Sherian Rein 06/08/2010 09:15:31  _____________________________________________________________________  External Attachment:    Type:   Image     Comment:   External Document

## 2010-11-23 NOTE — Procedures (Signed)
Summary: Small Bowel Endoscopy  Patient: Latoya [name] Note: All result statuses are Final unless otherwise noted.  Tests: (1) Small Bowel Endoscopy (SBE)  SBE Small Bowel Endoscopy                             DONE     [facility]     [address] [place], [place]  [address]           OPERATIVE PROCEDURE REPORT           PATIENT:  Latoya [name], Latoya [name]  MR#:  [id number]     BIRTHDATE:  07-25-1946, 64 yrs. old  GENDER:  female           ENDOSCOPIST:  Barbette Hair. Arlyce Dice, MD     ASSISTANT:           PROCEDURE DATE:  06/07/2010     PROCEDURE:  EGD, Small Bowel Endoscopy     ASA CLASS:  Class II     INDICATIONS:  1) abdominal pain  2) iron deficiency anemia           MEDICATIONS:   Fentanyl 100 mcg, Versed 10 mg, glycopyrrolate     (Robinal) 0.2 mg IV     TOPICAL ANESTHETIC:  Cetacaine Spray           DESCRIPTION OF PROCEDURE:   After the risks benefits and     alternatives of the procedure were thoroughly explained, informed     consent was obtained.  The  endoscope was introduced through the     mouth and advanced to the proximal jejunum, without limitations.     The instrument was slowly withdrawn as the mucosa was fully     examined.     <<PROCEDUREIMAGES>>           The upper, middle, and distal third of the esophagus were     carefully inspected and no abnormalities were noted. The z-line     was well seen at the GEJ. The endoscope was pushed into the fundus     which was normal including a retroflexed view. The antrum, first     and second part of the duodenum were unremarkable.  The duodenal     bulb was normal in appearance, as was the postbulbar duodenum (see     image1, image2, image3, image5, image6, and image7).     Retroflexed views revealed no abnormalities.    The scope was then     withdrawn from the patient and the procedure terminated.           COMPLICATIONS:  None           ENDOSCOPIC IMPRESSION:     1) Normal EGD     2) Normal duodenum          No obvious source for abdominal  pain or GI blood loss     RECOMMENDATIONS:     1) hemmoccult stools 1 week     2) continue current meds     3) hyomax as needed for abdominal  pain     4) OV 3-4 weeks           REPEAT EXAM:  No           ______________________________     Barbette Hair. Arlyce Dice, MD           CC:  Shary Decamp, M.D.  n.     eSIGNED:   Barbette Hair. Kaplan at 06/07/2010 09:33 AM           Sonnen, [place], [id number]  Note: An exclamation mark (!) indicates a result that was not dispersed into the flowsheet. Document Creation Date: 06/07/2010 9:35 AM _______________________________________________________________________  (1) Order result status: Final Collection or observation date-time: 06/07/2010 09:25 Requested date-time:  Receipt date-time:  Reported date-time:  Referring Physician:   Ordering Physician: Melvia Heaps [phone]) Specimen Source:  Source: Launa Grill Order Number: [id number] Lab site:

## 2010-12-01 ENCOUNTER — Encounter (INDEPENDENT_AMBULATORY_CARE_PROVIDER_SITE_OTHER): Payer: 59

## 2010-12-01 ENCOUNTER — Encounter: Payer: Self-pay | Admitting: Cardiovascular Disease

## 2010-12-01 ENCOUNTER — Ambulatory Visit (INDEPENDENT_AMBULATORY_CARE_PROVIDER_SITE_OTHER): Payer: 59 | Admitting: Cardiovascular Disease

## 2010-12-01 DIAGNOSIS — I6529 Occlusion and stenosis of unspecified carotid artery: Secondary | ICD-10-CM

## 2010-12-01 DIAGNOSIS — I2510 Atherosclerotic heart disease of native coronary artery without angina pectoris: Secondary | ICD-10-CM

## 2010-12-07 NOTE — Miscellaneous (Signed)
Summary: Orders Update  Clinical Lists Changes  Orders: Added new Test order of Carotid Duplex (Carotid Duplex) - Signed 

## 2010-12-07 NOTE — Assessment & Plan Note (Signed)
Summary: 6 month follow up /414.01/pla  needs Carotid Doppler same day...   Visit Type:  Follow-up Referring Provider:  na Primary Provider:  Shary Decamp, MD  CC:  during rehab class has had some chest pain.  History of Present Illness: 65 yo female with HTN, hyperlipidemia, anti-phospholipid antibody syndrome, GERD, asthma, CAD s/p cath 2003 with moderate non-obstructive disease (80% Circ, 80% ostial Diagonal), chronic back pain and paroxysmal atrial fibrillation here today for cardiac follow up.   She has been previously followed in the past by Dr. [name] in our office.  She has a hypercoagulable state but has not been managed on coumadin. She has allergies to heparin and Lovenox. She is seen by a hematologist at [facility] (Dr. Graylon Gunning) and he has told her that she does not need coumadin. She had been on amiodarone in the past but this was stopped  in August 2010 by Dr. [name]. She has been diagnosed with hypothyroidism and has been on Synthroid. She has more energy since she has been on the thyroid replacement. She has had no lower extremity edema but she does have chronic problems with pain in her feet from Raynaud's phenomenon and ongoing sciatica. She is seen by Thyra Breed for pain management.    Most recent stress test 2/10 with normal LV function , EF 51%, no ischemia. Last echo in February 2011 with normal LV size, mild LVH, normal LV systolic function with EF of 55%, mild MR with mildly thickened mitral valve leaflets. Carotid artery dopplers today with bilateral 40-59% ICA stenosis.   She tells me that she has been doing well. She does describe some chest pain while arguing with her husband. She also describes a squeezing in her chest during water aerobics. She has some SOB which she thinks is from asthma. She also has a hiatal hernia. Her chest pain is more frequent over the last few weeks. I have reviewed her cath from 2003. She was found to have a 30% ostial LM stenosis, 50% mid LAD,  80% moderate sized Diagonal, 80% Circumflex. There was discussion about bypass but ultimately medical management was pursued.     Current Medications (verified): 1)  Metoprolol Tartrate 50 Mg Tabs (Metoprolol Tartrate) .... Take One Tablet By Mouth Twice A Day 2)  Centrum Silver  Chew (Multiple Vitamins-Minerals) .... Once Daily 3)  Clarinex-D 12 Hour 2.5-120 Mg Xr12h-Tab (Desloratadine-Pseudoephedrine) .... Two Times A Day 4)  Glucosamine-Chondroitin 750-600 Mg Tabs (Glucosamine-Chondroitin) .... Two Times A Day 5)  Colace 100 Mg Caps (Docusate Sodium) .Marland Kitchen.. 1 Cap As Needed 6)  Furosemide 20 Mg Tabs (Furosemide) .... Take One Tablet By Mouth Daily As Needed Swelling 7)  Astelin 137 Mcg/spray Soln (Azelastine Hcl) .... As Needed 8)  Aspirin 81 Mg Tbec (Aspirin) .... Take One Tablet By Mouth Daily 9)  Calcium-Vitamin D .... 1 Tab By Mouth Two Times A Day 10)  Cyclobenzaprine Hcl 10 Mg Tabs (Cyclobenzaprine Hcl) .Marland Kitchen.. 1 Tab By Mouth Three Times A Day As Needed 11)  Lidoderm 5 % Ptch (Lidocaine) .... As Needed 12)  Nitroglycerin 0.4 Mg Subl (Nitroglycerin) .... One Tablet Under Tongue Every 5 Minutes As Needed For Chest Pain---May Repeat Times Three 13)  Dilaudid 4 Mg Tabs (Hydromorphone Hcl) .... As Needed 14)  Skelaxin 800 Mg Tabs (Metaxalone) .... As Needed 15)  Lipitor 20 Mg Tabs (Atorvastatin Calcium) .... Take One Tablet By Mouth Daily. 16)  Arthrotec 75-200 Mg-Mcg Tabs (Diclofenac-Misoprostol) .... Take 1 Tablet By Mouth Two Times A  Day 17)  Calcium Carbonate-Vitamin D 600-400 Mg-Unit  Tabs (Calcium Carbonate-Vitamin D) .... Take 1 Tablet By Mouth Two Times A Day 18)  Duragesic-100 100 Mcg/hr Pt72 (Fentanyl) .... Change Every 2 Days 19)  Neurontin 600 Mg Tabs (Gabapentin) .... As Needed 20)  Qvar 80 Mcg/act Aers (Beclomethasone Dipropionate) .... 2 Puffs 3 X A Day 21)  Singulair 10 Mg Tabs (Montelukast Sodium) .... Take 1 Tablet By Mouth Once A Day 22)  Synthroid 50 Mcg Tabs  (Levothyroxine Sodium) .... Take 1 Tablet By Mouth Once A Day 23)  Valium 5 Mg Tabs (Diazepam) .... Take 1 Tab By Mouth At Bedtime 24)  Hyomax-Sr 0.375 Mg Xr12h-Tab (Hyoscyamine Sulfate) .... Take 1 Tab Two Times A Day As Needed Abdominal Pain 25)  Nexium 40 Mg Cpdr (Esomeprazole Magnesium) .... Take 1 Tab Twice Daily 26)  Maxair Autohaler 200 Mcg/inh Aerb (Pirbuterol Acetate) .... As Needed  Allergies (verified): 1)  ! Heparin 2)  ! * Lovenox 3)  ! Betadine 4)  ! Indocin 5)  ! Augmentin  Past History:  Past Medical History: Reviewed history from 08/30/2010 and no changes required. HYPERTENSION (ICD-401.9) SPONDYLOSIS, LUMBAR (ICD-721.3) GERD (ICD-530.81) ASTHMA (ICD-493.90) PULMONARY EMBOLISM (ICD-415.19) DVT (ICD-453.40) PAROXYSMAL ATRIAL FIBRILLATION (ICD-427.31)-not on coumadin therapy HYPERLIPIDEMIA (ICD-272.4) CORONARY HEART DISEASE (ICD-414.00) 80% ostial D1 and moderate 80%  mid circ  by cath 2003 EXTRINSIC ASTHMA, UNSPECIFIED (ICD-493.00) Asthma moderate persistent     -normal spirometry 2010         -Normal CXR 1/08 Antiphospholipid syndrome with hypercoagulable state Coronary Heart Disease Prior PE related to back surgery with prior coumadin use now off Allergy to heparin/lovenox  Erosive Gastritis 1994 Anemia--Iron Def  Social History: Reviewed history from 11/15/2009 and no changes required.  The patient lives in [place] with her husband.  She has had miscarriages but no children.  She is disabled.   She has no smoking, EtOH, or illicit drug use.   However,  she does take opioids currently for pain relief.  She takes no herbal   medications.  She has a heart healthy diet.  She exercises nearly every  day doing water aerobics, but is unable to exercise outside of water.      Review of Systems       The patient complains of chest pain.  The patient denies fatigue, malaise, fever, weight gain/loss, vision loss, decreased hearing, hoarseness, palpitations,  shortness of breath, prolonged cough, wheezing, sleep apnea, coughing up blood, abdominal pain, blood in stool, nausea, vomiting, diarrhea, heartburn, incontinence, blood in urine, muscle weakness, joint pain, leg swelling, rash, skin lesions, headache, fainting, dizziness, depression, anxiety, enlarged lymph nodes, easy bruising or bleeding, and environmental allergies.    Vital Signs:  Patient profile:   65 year old female Height:      66 inches Weight:      171.25 pounds BMI:     27.74 Pulse rate:   88 / minute BP sitting:   132 / 70  (left arm) Cuff size:   regular  Vitals Entered By: Caralee Ates CMA (December 01, 2010 3:31 PM)  Physical Exam  General:  General: Well developed, well nourished, NAD Musculoskeletal: Muscle strength 5/5 all ext Psychiatric: Mood and affect normal Neck: No JVD, no carotid bruits, no thyromegaly, no lymphadenopathy. Lungs:Clear bilaterally, no wheezes, rhonci, crackles CV: RRR no murmurs, gallops rubs Abdomen: soft, NT, ND, BS present Extremities: No edema, pulses 2+.    Carotid Doppler  Procedure date:  12/01/2010  Findings:  40-59% bilateral ICA stenosis.   Impression & Recommendations:  Problem # 1:  CAD, NATIVE VESSEL (ICD-414.01) Recent exertional chest pain. She has severe CAD by cath 2003. I think she need repeat cath. She wishes to delay for now. She will call back when ready to schedule. I will continue current meds. Will add NTG SL as needed. When she calls back, we will schedule cath in [facility].   Her updated medication list for this problem includes:    Metoprolol Tartrate 50 Mg Tabs (Metoprolol tartrate) .Marland Kitchen... Take one tablet by mouth twice a day    Aspirin 81 Mg Tbec (Aspirin) .Marland Kitchen... Take one tablet by mouth daily    Nitroglycerin 0.4 Mg Subl (Nitroglycerin) ..... One tablet under tongue every 5 minutes as needed for chest pain---may repeat times three  Problem # 2:  CAROTID ARTERY DISEASE (ICD-433.10) Repeat in one year.     Her updated medication list for this problem includes:    Aspirin 81 Mg Tbec (Aspirin) .Marland Kitchen... Take one tablet by mouth daily  Patient Instructions: 1)  Your physician recommends that you schedule a follow-up appointment in: 6 months Prescriptions: NITROGLYCERIN 0.4 MG SUBL (NITROGLYCERIN) One tablet under tongue every 5 minutes as needed for chest pain---may repeat times three  #25 x 3   Entered by:   Whitney Maeola Sarah RN   Authorized by:   Verne Carrow, MD   Signed by:   Ellender Hose RN on 12/01/2010   Method used:   Electronically to        [facility]       [address]       [place], [place]  [address]       Ph: [phone] or [phone]       Fax: [phone]   RxID:   [id number]

## 2010-12-15 ENCOUNTER — Ambulatory Visit
Admission: RE | Admit: 2010-12-15 | Discharge: 2010-12-15 | Disposition: A | Discharge status: Home / Self Care | Payer: 59 | Source: Ambulatory Visit | Attending: Allergy | Admitting: Allergy

## 2010-12-15 ENCOUNTER — Other Ambulatory Visit: Payer: Self-pay | Admitting: Allergy

## 2010-12-15 DIAGNOSIS — R05 Cough: Secondary | ICD-10-CM

## 2010-12-15 DIAGNOSIS — R062 Wheezing: Secondary | ICD-10-CM

## 2011-01-30 LAB — CBC
HCT: 22.6 % — ABNORMAL LOW (ref 36.0–46.0)
HCT: 29.4 % — ABNORMAL LOW (ref 36.0–46.0)
Hemoglobin: 10.3 g/dL — ABNORMAL LOW (ref 12.0–15.0)
Hemoglobin: 7.7 g/dL — CL (ref 12.0–15.0)
MCHC: 35 g/dL (ref 30.0–36.0)
RDW: 14.8 % (ref 11.5–15.5)
RDW: 16.6 % — ABNORMAL HIGH (ref 11.5–15.5)
WBC: 12.4 10*3/uL — ABNORMAL HIGH (ref 4.0–10.5)

## 2011-01-30 LAB — TYPE AND SCREEN: Antibody Screen: NEGATIVE

## 2011-01-30 LAB — POCT I-STAT 7, (LYTES, BLD GAS, ICA,H+H)
Calcium, Ion: 1.23 mmol/L (ref 1.12–1.32)
HCT: 24 % — ABNORMAL LOW (ref 36.0–46.0)
O2 Saturation: 100 %
Potassium: 3.8 mEq/L (ref 3.5–5.1)
pCO2 arterial: 43 mmHg (ref 35.0–45.0)
pH, Arterial: 7.401 — ABNORMAL HIGH (ref 7.350–7.400)

## 2011-01-30 LAB — BASIC METABOLIC PANEL
CO2: 26 mEq/L (ref 19–32)
Chloride: 95 mEq/L — ABNORMAL LOW (ref 96–112)
GFR calc non Af Amer: >60 mL/min (ref >60)
Glucose, Bld: 167 mg/dL — ABNORMAL HIGH (ref 70–99)
Glucose, Bld: 171 mg/dL — ABNORMAL HIGH (ref 70–99)
Potassium: 4.4 mEq/L (ref 3.5–5.1)
Potassium: 4.7 mEq/L (ref 3.5–5.1)
Sodium: 126 mEq/L — ABNORMAL LOW (ref 135–145)
Sodium: 133 mEq/L — ABNORMAL LOW (ref 135–145)

## 2011-01-30 LAB — POCT I-STAT 4, (NA,K, GLUC, HGB,HCT): HCT: 35 % — ABNORMAL LOW (ref 36.0–46.0)

## 2011-01-31 LAB — BASIC METABOLIC PANEL
BUN: 15 mg/dL (ref 6–23)
BUN: 5 mg/dL — ABNORMAL LOW (ref 6–23)
BUN: 19 mg/dL (ref 6–23)
BUN: 6 mg/dL (ref 6–23)
CO2: 27 mEq/L (ref 19–32)
CO2: 33 mEq/L — ABNORMAL HIGH (ref 19–32)
CO2: 31 mEq/L (ref 19–32)
Calcium: 8.9 mg/dL (ref 8.4–10.5)
Calcium: 9.1 mg/dL (ref 8.4–10.5)
Calcium: 9.1 mg/dL (ref 8.4–10.5)
Chloride: 97 mEq/L (ref 96–112)
Chloride: 99 mEq/L (ref 96–112)
Creatinine, Ser: 0.4 mg/dL (ref 0.4–1.2)
Creatinine, Ser: 0.42 mg/dL (ref 0.4–1.2)
Creatinine, Ser: 0.62 mg/dL (ref 0.4–1.2)
GFR calc non Af Amer: >60 mL/min (ref >60)
GFR calc non Af Amer: >60 mL/min (ref >60)
GFR calc non Af Amer: >60 mL/min (ref >60)
Glucose, Bld: 113 mg/dL — ABNORMAL HIGH (ref 70–99)
Glucose, Bld: 190 mg/dL — ABNORMAL HIGH (ref 70–99)
Glucose, Bld: 235 mg/dL — ABNORMAL HIGH (ref 70–99)
Glucose, Bld: 103 mg/dL — ABNORMAL HIGH (ref 70–99)
Potassium: 4.3 mEq/L (ref 3.5–5.1)
Potassium: 3.7 mEq/L (ref 3.5–5.1)
Sodium: 139 mEq/L (ref 135–145)

## 2011-01-31 LAB — DIFFERENTIAL
Basophils Absolute: 0.1 10*3/uL (ref 0.0–0.1)
Basophils Relative: 1 % (ref 0–1)
Eosinophils Absolute: 0.1 10*3/uL (ref 0.0–0.7)
Eosinophils Relative: 1 % (ref 0–5)
Lymphocytes Relative: 15 % (ref 12–46)
Lymphs Abs: 2.3 10*3/uL (ref 0.7–4.0)
Monocytes Absolute: 2.1 10*3/uL — ABNORMAL HIGH (ref 0.1–1.0)
Monocytes Relative: 14 % — ABNORMAL HIGH (ref 3–12)
Neutro Abs: 10.4 10*3/uL — ABNORMAL HIGH (ref 1.7–7.7)
Neutrophils Relative %: 69 % (ref 43–77)

## 2011-01-31 LAB — URINALYSIS, ROUTINE W REFLEX MICROSCOPIC
Bilirubin Urine: NEGATIVE
Hgb urine dipstick: NEGATIVE
Protein, ur: NEGATIVE mg/dL
Specific Gravity, Urine: 1.018 (ref 1.005–1.030)
Urobilinogen, UA: 2 mg/dL — ABNORMAL HIGH (ref 0.0–1.0)

## 2011-01-31 LAB — TISSUE CULTURE

## 2011-01-31 LAB — URINE CULTURE

## 2011-01-31 LAB — CBC
HCT: 29.9 % — ABNORMAL LOW (ref 36.0–46.0)
Hemoglobin: 10.2 g/dL — ABNORMAL LOW (ref 12.0–15.0)
MCHC: 33.9 g/dL (ref 30.0–36.0)
MCHC: 34.1 g/dL (ref 30.0–36.0)
MCV: 83 fL (ref 78.0–100.0)
MCV: 83 fL (ref 78.0–100.0)
Platelets: 277 10*3/uL (ref 150–400)
Platelets: 422 10*3/uL — ABNORMAL HIGH (ref 150–400)
RBC: 3.6 MIL/uL — ABNORMAL LOW (ref 3.87–5.11)
RDW: 16.4 % — ABNORMAL HIGH (ref 11.5–15.5)
RDW: 17 % — ABNORMAL HIGH (ref 11.5–15.5)
WBC: 15.1 10*3/uL — ABNORMAL HIGH (ref 4.0–10.5)

## 2011-01-31 LAB — PROTIME-INR: Prothrombin Time: 12.6 seconds (ref 11.6–15.2)

## 2011-01-31 LAB — GRAM STAIN

## 2011-01-31 LAB — ANAEROBIC CULTURE

## 2011-01-31 LAB — URINE MICROSCOPIC-ADD ON

## 2011-02-15 ENCOUNTER — Ambulatory Visit (HOSPITAL_COMMUNITY)
Admission: RE | Admit: 2011-02-15 | Discharge: 2011-02-15 | Disposition: A | Discharge status: Home / Self Care | Payer: 59 | Source: Ambulatory Visit | Attending: Gastroenterology | Admitting: Gastroenterology

## 2011-02-15 DIAGNOSIS — Z09 Encounter for follow-up examination after completed treatment for conditions other than malignant neoplasm: Secondary | ICD-10-CM | POA: Insufficient documentation

## 2011-02-15 DIAGNOSIS — Z860100 Personal history of colon polyps, unspecified: Secondary | ICD-10-CM | POA: Insufficient documentation

## 2011-02-20 ENCOUNTER — Ambulatory Visit (HOSPITAL_BASED_OUTPATIENT_CLINIC_OR_DEPARTMENT_OTHER): Admission: RE | Admit: 2011-02-20 | Payer: 59 | Source: Ambulatory Visit | Admitting: Plastic Surgery

## 2011-02-27 ENCOUNTER — Ambulatory Visit (HOSPITAL_BASED_OUTPATIENT_CLINIC_OR_DEPARTMENT_OTHER): Admission: RE | Admit: 2011-02-27 | Payer: 59 | Source: Ambulatory Visit | Admitting: Plastic Surgery

## 2011-03-04 NOTE — Op Note (Signed)
  NAMESTEWART, PIMENTA                ACCOUNT NO.:  [id number]  MEDICAL RECORD NO.:  [id number]           PATIENT TYPE:  O  LOCATION:  WLEN                         FACILITY:  [facility]  PHYSICIAN:  Danise Edge, M.D.   DATE OF BIRTH:  Oct 19, 1946  DATE OF PROCEDURE:  02/15/2011 DATE OF DISCHARGE:                              OPERATIVE REPORT   PROCEDURE:  Surveillance colonoscopy  REFERRING PHYSICIAN:  Thayer Headings, M.D.  HISTORY:  Latoya [name] is a 65 year old female born 1946-03-12.  The patient has undergone colonic exams in the past to remove neoplastic colon polyps.  In 2007, the patient surveillance colonoscopy was normal.  ENDOSCOPIST:  Danise Edge, M.D.  PREMEDICATIONS:  Fentanyl 100 mcg, Versed 7.5 mg.  PROCEDURE:  After obtaining informed consent, the patient was placed in the left lateral decubitus position.  Anal inspection and digital rectal exam were normal.  The Pentax pediatric colonoscope was introduced into the rectum and advanced to the cecum.  A normal-appearing ileocecal valve and appendiceal orifice were identified.  Colonic preparation for the exam today was good.  Rectum normal.  Sigmoid colon and descending colon normal.  Splenic flexure normal.  Transverse colon normal.  Hepatic flexure normal.  Ascending colon normal.  Cecum and ileocecal valve normal.  ASSESSMENT:  Normal surveillance proctocolonoscopy to the cecum.  RECOMMENDATIONS:  Schedule repeat surveillance colonoscopy in 5 years.          ______________________________ Danise Edge, M.D.     MJ/MEDQ  D:  02/15/2011  T:  02/15/2011  Job:  [id number]  cc:   Thayer Headings, M.D. Fax: [phone]  Electronically Signed by Danise Edge M.D. on 03/04/2011 08:52:44 AM

## 2011-03-06 NOTE — Assessment & Plan Note (Signed)
[facility]                            CARDIOLOGY OFFICE NOTE   NAME:[name], Latoya TROOST                       MRN:          [id number]  DATE:11/11/2008                            DOB:          1946/08/25    Latoya [name] is seen back today at the request of Dr. [name].  She needs more back  surgery.   Latoya [name] was extremely upset today.  Her older sister died recently and Latoya [name]  was the executor.  This particular sister helped Latoya [name] when she was  younger.  Latoya [name] had many orthopedic challenges growing up including cleft  lip and cleft palate as well as difficulty walking and this particular  sister helped her through many of these ordeals.  Latoya [name] also was crying  today regarding her difficulty getting around with her walker.  She was  an hour late for her appointment.  She had difficulty parking her car  and getting up to our office   I told her that for now on, she should just park it front and call up  stairs.  I gave her my nurse's number and the doctors' backdoor number  so that we could come down and park the car for her.  Unfortunately, Latoya [name]  needs more back surgery.  She tells me this time that Dr. [name] was more  concerned about her heart because he will have to have both an anterior  and posterior approach.  As far as I can tell, Dr. Madilyn Fireman will help  excess anteriorly due to the risk of hitting vascular structures.  She  apparently has some screws that her loose and need to be revised.   Unfortunately, Latoya [name] has chronic back pain.  I do not think any amount of  surgery is going to help with this.  She has significant kyphoscoliosis.   From a cardiac perspective, she has had previous multiple surgeries  without complication.  She does have known coronary artery disease.  Back in 2003, she was cathed by Dr. Riley Kill.  At that time, she had an  80% ostial lesion in the diagonal branch and a 70% mid circ lesion at  the takeoff of the OM.   I do not think that the  diagonal lesion will be an issue.  Ostial  lesions in these branch arteries tend to be very stable.  In general,  Latoya [name] does not get chest pain; however, it would be difficult to know  what is angina and what is not due to her chronic pain syndrome.   She has had paroxysmal atrial fibrillation in the past in particular  around the time of any surgery.  Pain control was extremely important.  The last time she went in atrial fibrillation, she had significant  anemia and pain.  In regards to her surgical risk, she needs to have  exquisite pain control and also to make sure hemoglobin stays above 10.   I will talk to Dr. [name] about these issues.  She told me that Dr. [name]  wanted her to have a heart catheterization prior to surgery.  I  explained to  Latoya [name] that she did not need a heart catheterization before  surgery.  I understood the higher risk given an anterior approach;  however, putting a stent in any of her arteries would preclude any  surgery for a year due to Plavix issues.   Latoya [name] has had a Myoview study back in 2005 which was normal.  I reviewed  those images and they were of high quality.  I think the best approach  would be to do another pharmacological stress test, and if there is no  evidence of ischemia, her periop risk would be extremely low.   I explained this to Latoya [name] and she is willing to proceed.   Her review of systems otherwise is remarkable for her sister's death  which has caused her significant depression.  Otherwise, she has not had  PND, no orthopnea, no lower extremity edema.  She has chronic pain from  her back.  There has been no syncope.  No palpitations.  No evidence of  recurrent PAF.  No TIA or CVA.  There is a question of an IODINE  allergy, and if she were to be cathed she would need prophylaxis for  contrast allergy.   Her past medical history is otherwise remarkable for chronic pain  syndrome, multiple back surgeries, previous cleft palate surgery,   history of COPD followed by Dr. Delford Field, coronary disease, medical  therapy, anxiety and depression, paroxysmal atrial fibrillation.   Family history is noncontributory.   The patient is married.  She still gets around, does everything for  herself despite her chronic back problems.  She does not smoke or drink.  She has significant anxiety and depression regarding her chronic pain.  She is one of 10 children, 7 from her living, and her older sister just  died.   PHYSICAL EXAMINATION:  GENERAL:  Remarkable for chronically ill female  with teary-eyes.  Affect is depressed.  VITAL SIGNS:  Weight is 151, blood pressure 150/80, pulse 85 and  regular, respiratory rate 14, afebrile.  HEENT:  Unremarkable.  She has multiple surgical scars anteriorly and  posteriorly.  LUNGS:  Clear.  Good diaphragmatic motion.  No wheezing.  HEART:  S1 and S2 with normal heart sounds.  PMI normal.  ABDOMEN:  Benign.  Bowel sounds positive.  No AAA.  No tenderness.  No  bruit.  No hepatosplenomegaly, hepatojugular reflux, or tenderness.  EXTREMITIES:  Distal pulses are intact.  No edema.  NEUROLOGIC:  Nonfocal.  SKIN:  Warm and dry.  MUSCULOSKELETAL:  No muscular weakness.   Her electrocardiogram shows sinus rhythm with no acute changes, possible  left atrial enlargement.   Her current medication include:  1. Calcium.  2. Aspirin.  3. Arthrotec.  4. Duragesic patch.  5. Lipitor 20 a day.  6. Nasacort.  7. Nexium 40 a day.  8. Neurontin.  9. Singulair.  10.Skelaxin.  11.Maxair.  12.QVAR.   She previously had been on a beta-blocker.   IMPRESSION:  1. Coronary disease.  Need for preop clearance.  Followup adenosine      Myoview.  We will reinstitute a beta-blocker prior to her surgery.      Continue aspirin therapy.  2. Chronic back pain.  Follow up with Dr. Danae Orleans. [name].  I suspect      her Myoview study will be low enough for [place] to proceed with      surgery.  3. Anxiety and depression.   Followup primary care MD.  She may benefit  from change in her antidepressive medicine.  She has used Valium on      liberal basis.  4. Chronic obstructive pulmonary disease, stable.  Continue Maxair.      Follow with Dr. Delford Field.  Should not be prohibitive of surgery.  5. Transportation issues.  Again, Latoya [name] had significant problems in      regards to her parking and getting up to her appointment on time.      We will try to facilitate this for her.   I spent a lot of time with Latoya [name] today consoling her regarding her  sister's death as well as exploring the easiest way to clear her for yet  another back surgery.  I think this will be the best approach, and I  clearly do not think there is any indication for heart cath at this  time.     [name]. [name], MD, [facility]  Electronically Signed    PCN/MedQ  DD: 11/11/2008  DT: 11/12/2008  Job #: [id number]

## 2011-03-06 NOTE — Op Note (Signed)
[name], [name]                ACCOUNT NO.:  [id number]   MEDICAL RECORD NO.:  [id number]           PATIENT TYPE:   LOCATION:                                 FACILITY:   PHYSICIAN:  Danae Orleans. [name], M.D.  DATE OF BIRTH:  [birth year]   DATE OF PROCEDURE:  03/01/2009  DATE OF DISCHARGE:                               OPERATIVE REPORT   PREOPERATIVE DIAGNOSIS:  Migrated L5-S1 anterior lumbar interbody fusion  cage with back and bilateral lower extremity pain.   POSTOPERATIVE DIAGNOSIS:  Migrated L5-S1 anterior lumbar interbody  fusion cage with back and bilateral lower extremity pain.   PROCEDURE:  L5-S1 ALIF revision with 0-P cage and screw-plate construct  with BMP and EquivaBone.   SURGEON:  Danae Orleans. [name], MD   ASSISTANTS:  1. [name], RN  2. Stefani Dama, MD   ANESTHESIA:  General endotracheal anesthesia.   ESTIMATED BLOOD LOSS:  800 mL of blood with Cell Saver blood returned to  the patient.   COMPLICATIONS:  None.   DISPOSITION:  Recovery.   INDICATIONS:  Latoya [name] is a 63-yewar-old woman who underwent L5-S1  ALIF fusion.  She fell and had sudden severe pain and while at home it  appeared on x-rays that the previously placed ALIF cage had migrated out  and the anchoring plates had disengaged from the bone.  The patient was  having severe back and bilateral lower extremity pain and was elected to  admit her to the hospital for bed rest and with plan of revision ALIF  surgery.  Dr. Madilyn Fireman assisted and performed the approach.   PROCEDURE:  Ms. Coultas was brought to the operating room.  Following  satisfactory and uncomplicated induction of general endotracheal  anesthesia and placement of intravenous lines and central venous  catheter, she was placed in the supine position on the operating table.  Her abdomen was prepped with Betadine and then alcohol.  Dr. Madilyn Fireman  performed the initial exposure.  He got [place] to the migrated cage, and  there was dense  inflammatory response around this implant.  It appeared  that the case had dislodged with anterior portion of the sacrum broken  off.  The cage was removed.  We then painstakingly performed dissection  around very densely scarred vessels and exposing the L5-S1 interspace.  After removing inflammatory response in the bone and also a step-off  overlying the sacrum, we placed a 0-P SynFix cage.  After decompressing  the thecal sac and both S1 nerve roots, cage we placed was 26-mm depth,  32-mm width, 19-mm height, and 12-degree lordosis and placed large  screws into the L5 and S1 levels using one 25-mm screws, one 30-mm  screw, one 20-mm screw, and one 15-mm screw.  All screws appeared to  have excellent purchase.  Additional EquivaBone was packed overlying the  cage laterally.  The radiographs, AP and lateral fluoroscopy  demonstrated well-positioned interbody graft and cage.  The self-  retaining retractors were then removed.  The soft tissues were inspected  and found to be in good repair.  The wound  was irrigated.  The fascia  was closed with running 1-0 Vicryl stitch, and the subcutaneous tissues  were approximated with 2-0 Vicryl subcuticular stitch and 3-0 Vicryl  subcuticular stitch was used to reapproximate the skin edges.  Wound was  dressed with Dermabond.  The patient was extubated in the operating room  and taken to recovery in stable and satisfactory having tolerated her  operation well.  Counts were correct at the end of the case.      Danae Orleans. [name], M.D.  Electronically Signed     JDS/MEDQ  D:  03/01/2009  T:  03/02/2009  Job:  [id number]

## 2011-03-06 NOTE — Assessment & Plan Note (Signed)
[facility]                            CARDIOLOGY OFFICE NOTE   NAME:Pantaleon, DRINDA BELGARD                       MRN:          [id number]  DATE:07/05/2008                            DOB:          06/24/1946    Latoya [name] returns today for followup.  She is actually fairly animated  today.  Unfortunately her back is still a mess.  She may need further  spinal fusion surgery by Dr. [name].  She has a history of coronary  disease and previous coronary artery bypass grafts.  She has been stable  and not having chest pain.  She has a history of antiphospholipid  syndrome.  We have taken her off Coumadin about 3 years ago.  She has  not had any recurrent results, any recurrent thromboses.  The only DVT  that she has ever had was after spinal surgery.   From a cardiac perspective, she is stable.  She is not having recurrent  palpitations, PND, or orthopnea.  She is not having a significant chest  pain.   Her review of systems otherwise remarkable for significant pain which  she has a Duragesic patch for.   MEDICATIONS:  1. Calcium.  2. Aspirin.  3. Arthrotec.  4. Metoprolol 75 b.i.d.  5. Duragesic patch.  6. Lipitor 20 mg a day.  7. Nasacort.  8. Nexium.  9. Neurontin.  10.Singulair 10 mg a day.  11.Skelaxin.  12.Claritin.  13.Maxair.  14.__________  15.Multiple pain medicines.   PHYSICAL EXAMINATION:  GENERAL:  Chronically ill-appearing female with  significant kyphosis and back problems.  VITAL SIGNS:  Weight is 156, blood pressure is 150/80, pulse 84 regular,  respiratory rate 14, afebrile.  HEENT:  Unremarkable.  NECK:  Carotids are normal without bruit.  No lymphadenopathy,  thyromegaly, or JVP elevation.  LUNGS:  Clear to good diaphragmatic motion.  No wheezing.  Large lumbar  scar going from the thoracic spine down to the sacrum.  S1-S2.  Normal  heart sounds.  PMI normal.  ABDOMEN:  Benign.  Bowel sounds positive.  No AAA, no tenderness, no  bruit, no hepatosplenomegaly, no hepatojugular reflux, no tenderness.  EXTREMITIES:  Distal pulses are intact, varicosities, and +1 edema.  NEURO:  Nonfocal.  SKIN:  Warm and dry.  No muscular weakness.   IMPRESSION:  1. History of paroxysmal atrial fibrillation, currently stable.      Maintaining sinus rhythm.  Continue aspirin and beta-blocker.  2. Antiphospholipid syndrome.  No recent thromboses.  No indication to      resume Coumadin.  Follow up with Dr. Phylliss Bob.  3. History of coronary disease with previous coronary artery bypass      graft.  Continue to follow medically.  Continue aspirin and beta-      blocker.  Limited activity due to back problems.  Stress testing      very painful for the patient due to her back.  We will try to avoid      unless clinically symptomatic.  4. Significant lumbosacral spine problems, chronic.  Follow up Dr.      [name].  5. Hypercholesteremia in the setting coronary disease.  Continue      Lipitor, lipid, and liver profile in 6 months.  6. Chronic pain syndrome.  Continue Lidoderm and Duragesic patch.   I will see Ebany back in 6 months.     Latoya [name]. Latoya Emms, MD, [facility]  Electronically Signed    PCN/MedQ  DD: 07/05/2008  DT: 07/06/2008  Job #: [id number]

## 2011-03-06 NOTE — Op Note (Signed)
Latoya [name]                ACCOUNT NO.:  [id number]   MEDICAL RECORD NO.:  [id number]          PATIENT TYPE:  INP   LOCATION:  3172                         FACILITY:  [facility]   PHYSICIAN:  Latoya [name], M.D.  DATE OF BIRTH:  1946-02-26   DATE OF PROCEDURE:  02/01/2009  DATE OF DISCHARGE:                               OPERATIVE REPORT   PREOPERATIVE DIAGNOSIS:  L5-S1 pseudoarthrosis with intractable back and  bilateral lower extremity pain.   POSTOPERATIVE DIAGNOSIS:  L5-S1 pseudoarthrosis with intractable back  and bilateral lower extremity pain.   ADDITIONAL PREOPERATIVE DIAGNOSIS:  Antiphospholipid antibody syndrome.   ADDITIONAL POSTOPERATIVE DIAGNOSIS:  Antiphospholipid antibody syndrome.   PROCEDURES:  Anterior lumbar decompression after revision fusion, L5-S1  with 12 x 6 degrees x 33 mm LDR PEEK cage with bone morphogenic protein  and EquivaBone.   SURGEON:  Latoya Orleans. [name], MD   ASSISTANTS:  1. Latoya Cocker, RN  2. Latoya Dama, MD   ANESTHESIA:  General endotracheal anesthesia.   ESTIMATED BLOOD LOSS:  200 mL.   COMPLICATIONS:  None.   DISPOSITION:  Recovery.   INDICATIONS:  Latoya [name] is a 65 year old woman who has had  multiple prior spinal surgeries.  She had a thoracolumbar fusion and she  has a pseudoarthrosis at the L5-S1 level.  It was elected to take her to  surgery for anterior lumbar decompression and fusion at the L5-S1 level  with the possibility of doing posterior iliac screw fixation if a  supplemental posterior fixation was required.  The patient has  antiphospholipid antibody and allergy to heparin.   PROCEDURE IN DETAIL:  [name] was brought to the operating room.  Following satisfactory and uncomplicated induction of general  endotracheal anesthesia and placement of intravenous lines, the patient  was placed in supine position on the operating table.  Her anterior  abdomen was then prepped and draped in the usual sterile  fashion with  Betadine scrub followed by alcohol wipe.  The incision and exposure was  performed by Dr. Madilyn [name].  Upon obtaining exposure of the L5-S1 interspace  and confirming the correct orientation with spinal needle at this level  on both AP and lateral projections, the interspace was incised.  There  was a thick amount of granulation tissue with thickening of the anterior  longitudinal ligament.  This was incised and the previously placed TLIF  cage was removed.  The interspace was cleared of investing granulation  tissue.  Cultures were obtained to make sure there was no evidence of  infection.  The endplates were drilled to viable bone with high-speed  drill and the granulation tissue was removed to the level of the  posterior spinal canal.  After trial sizing with LDR cage, it was  elected to use a 12 x 6 x 33 mm implant and it appeared to be sized well  and fit.  The implant was then packed with extra small BMP and  EquivaBone.  The implant was then tamped into position, countersunk  appropriately, and its position was confirmed on lateral fluoroscopy.  The medium plate  was then inserted and anchored into the sacrum.  It was  not possible to seat this plate fully and despite multiple efforts to do  so, it appeared to be rigidly fixated within the sacral spine.  AP film  did not demonstrate that it was up against the screw and it was felt  that it was embedded in cortical bone.  This made it impossible to place  the superior plate, however, the cage and plate construct appeared to be  rigidly fixated.  When efforts to remove the lower plate were  unsuccessful, it was felt that the plate and cage appeared to be rigidly  positioned and I elected to leave it in its current configuration.  Additionally, EquivaBone was then tamped into position along the lateral  aspect of the cage.  The retractor blades were removed.  The vascular  structures were inspected and found to be in good  repair.  The wound was  irrigated.  The anterior rectus sheath was then closed with running 0-  Vicryl sutures and a 2-0 interrupted Vicryl subcuticular stitch was then  placed along with 3-0 Vicryl subcuticular stitches.  Wound was dressed  with Dermabond and occlusive dressing.  After final x-ray did not  demonstrate any retained foreign bodies, the patient was extubated in  the operating room and taken to recovery in stable and satisfactory  condition having tolerated the operation well.  Counts were correct at  the end of the case.      Latoya [name], M.D.  Electronically Signed     JDS/MEDQ  D:  02/01/2009  T:  02/02/2009  Job:  [id number]

## 2011-03-06 NOTE — Assessment & Plan Note (Signed)
[facility]                            CARDIOLOGY OFFICE NOTE   NAME:Latoya [name], Latoya [name]                       MRN:          [id number]  DATE:03/03/2007                            DOB:          08/18/1946    The patient returns today for followup. She has had moderate coronary  artery disease in the past. She has also had antiphospholipid syndrome  with previous Coumadin therapy. Her coronary risk factors include,  hyperlipidemia and hypertension. She has also had paroxysmal atrial  fibrillation.   Unfortunately she underwent her 10th back surgery in February with Dr.  Georgia Duff. She has significant instability in the lumbar spine with  significant kyphoscoliosis. She is also developing some cervical spine  problems which may need attention in the future. The patient was  actually was in a fairly good mood today despite all of her surgeries.  She wears a back brace and tries to be mobile. She enjoys listening to  music in the summer time. She is on a chronic Duragesic patch for pain  control. In regards to her heart, she has not had any significant chest  pain and has not had to use nitro. She has not had a Myoview since 2005  but she is fairly inactive due to her back problems and has had no  periop complications with her last 4 surgeries and we have not thought a  Myoview is indicated.   She gets occasional palpitations but they are not prolonged. They last a  minute or so and are happening maybe once a month. There has been no  evidence of recurrent atrial fibrillation.   She has been compliant with her diet and her Lipitor therapy. She needs  a follow up lipid and liver profile in 6 months. Her last LDL  cholesterol was around 100.   In regards to her heart she has had some increasing lower extremity  edema. She thinks its from her Arthrotec, however, she prefers to take  the Arthrotec for additional pain control. We have had her on some  p.r.n. diuretics in the past and I suspect we will need to rewrite this  prescription. She understands the need for a low sodium diet. Review of  systems otherwise negative. Her medications include;  1. Calcium.  2. An aspirin a day.  3. Arthrotec 75 b.i.d.  4. Lopressor 75 b.i.d.  5. Duragesic patch every 2 days.  6. Lipitor 20 a day.  7. Nexium 40 a day.  8. Neurontin 600 a day.  9. Pulmicort 2 puffs q. 12.  10.Singulair.  11.Skelaxin 200 b.i.d.   On exam, she is an elderly-appearing white female with chronic  kyphoscoliosis. She is afebrile. Blood pressure is 140/80, pulse is 80  and regular, weight is 165 and stable, respiratory rate is 14.  HEENT: Normal. There are no carotid bruits. No JVP elevation. No  thyromegaly.  LUNGS: Clear without wheezing. There is normal diaphragmatic motion. She  has multiple previous lumbar spine surgery scars.  There is an S1, S2 with normal heart sounds. PMI is not palpable.  Bowel sounds  are positive. There is no tenderness. No triple A. No  hepatosplenomegaly or hepatojugular refluxes.  Distal pulses are intact. There are no bruits. There is no  lymphadenopathy. No varicosities. She has 1+ edema bilaterally which is  dependent.  NEURO: Nonfocal and there is no muscular weakness.   IMPRESSION:  Stable paroxysmal atrial fibrillation maintaining sinus  rhythm, continue aspirin and beta blocker therapy. History of moderate  coronary artery disease without angina, prescription for nitroglycerine  renewed. Hyperlipidemia, continue Lipitor 20 a day. Follow up lipid and  liver profile in 6 months.   History of antiphospholipid syndrome without recurrent hypocoagulable  clinical syndromes, currently off Coumadin without sequela.   Chronic back problems primarily limiting her ability to ambulate,  continue to follow up with Dr. [name] for this. I will see her back in 6  months.     Latoya [name]. Latoya Emms, MD, [facility]  Electronically Signed     PCN/MedQ  DD: 03/03/2007  DT: 03/03/2007  Job #: [id number]

## 2011-03-06 NOTE — H&P (Signed)
Latoya [name]                ACCOUNT NO.:  [id number]   MEDICAL RECORD NO.:  [id number]          PATIENT TYPE:  INP   LOCATION:  3035                         FACILITY:  [facility]   PHYSICIAN:  Danae Orleans. [name], M.D.  DATE OF BIRTH:  July 11, 1946   DATE OF ADMISSION:  02/25/2009  DATE OF DISCHARGE:                              HISTORY & PHYSICAL   REASON FOR ADMISSION:  Increasing low back pain and bilateral lower  extremity pain.   HISTORY OF PRESENT ILLNESS:  Latoya [name] is a 65 year old woman who  is being admitted to 3000 for lumbar radiculopathy.  She recently  underwent anterior lumbar decompression and fusion at L5-S1 with cage,  BMP, and EquivaBone on February 01, 2009.  She was into the office today  with severe bilateral lower extremity pain.  X-rays demonstrate a  migrated ALIF cage at the L5-S1 level.  The plan is to admit her for  pain control and to prepare for surgical revision of the ALIF on Tuesday  morning, Mar 01, 2009, with Dr. Liliane Bade, who is co-surgeon, who  consulted [facility], Dr. [name] for her PAF with past surgeries  and increased pain and CAD with history of DVT and PE also.   ALLERGIES:  HEPARIN, LOVENOX, BETADINE, HIBICLENS, DYE, INDOCIN, PORK  PRODUCTS, and LACTOSE INTOLERANCE.   SURGICAL HISTORY:  She on February 01, 2009, had an L5-S1 ALIF and on  November 18, 2006, had lumbar fusion revision L1 through S1.  She has had  multiple previous lumbar and thoracolumbar surgeries.   PHYSICAL EXAMINATION:  VITAL SIGNS:  Blood pressure is 116/58, heart  rate is 74 and regular, respiratory rate 18 and clear, afebrile.  GENERAL:  She is a well-nourished, although somewhat debilitated 63-year-  old woman in obvious acute pain.  She has with any change of position  complaints of bilateral lower extremity pain.  EYES:  Conjunctivae are clear.  Sclerae white.  Pupils are equal, round,  and reactive to light.  Extraocular movements intact.  ENT:  Tongue  is midline.  Oropharynx pink and moist.  Ears are without  excessive wax, no drainage.  NECK:  Supple.  No masses.  No JVD.  Trachea midline.  RESPIRATORY:  Clear to auscultation.  HEART:  Heart rate is 74 and regular rate and rhythm without murmur.  EXTREMITIES:  Peripheral pulses are 2+.  No peripheral edema.  On motor  examination, she has painful kyphotic spine due to pain and generalized  weakness.  She has scoliosis which further hinders her mobility.  SKIN:  Intact.  There are no sores or open areas.  NEUROLOGIC:  She is oriented to person, time, and place.  ABDOMEN:  Soft and nontender.  Active bowel sounds.  No  hepatosplenomegaly appreciated.  GU:  No problems voiding.  No recent reported burning or discharge.   PAST MEDICAL HISTORY:  In addition to her cardiac history, she has  antiphospholipid antibody syndrome and has had DVTs as a result of that.  She has been on Arixtra, and plan is to continue that while she is in  the hospital.   CURRENT MEDICATIONS:  1. Amiodarone 200 mg 2 twice daily.  2. Astelin nasal spray b.i.d.  3. Clarinex D b.i.d.  4. Duragesic 800 mcg every 48 hours.  5. Enteric-coated aspirin 325 mg daily.  6. Lipitor 20 mg daily.  7. Metoprolol 50 mg twice daily.  8. Nasacort AQ spray 1-2 sprays 1-2 times per day.  9. Omeprazole 20 mg twice daily.  10.Neurontin 600 mg at bedtime.  11.QVAR 80 mcg inhaler 2 puffs t.i.d.  12.Flexeril 10 mg q.8 h. p.r.n. spasm.  13.Lidoderm patches.  14.Maxair p.r.n. for wheezing.  15.Nitroglycerin 0.4 mg sublingually as needed for chest pain.  16.Dilaudid 1 mg 1-2 every 3 hours as needed for pain.  17.Skelaxin 800 mg 1/2-1 every 8 hours as needed for spasm.  18.Calcium with vitamin D.  19.Multivitamin.  20.Colace daily.  21.Glucosamine and chondroitin.   DIET:  No pork and lactose intolerance.   PLAN:  To admit the patient for pain management with bed rest with  bathroom privileges.  We will plan on surgery on Mar 01, 2009.      Danae Orleans. [name], M.D.  Electronically Signed     JDS/MEDQ  D:  02/25/2009  T:  02/26/2009  Job:  [id number]

## 2011-03-06 NOTE — Discharge Summary (Signed)
Latoya [name], Latoya [name]                ACCOUNT NO.:  [id number]   MEDICAL RECORD NO.:  [id number]          PATIENT TYPE:  INP   LOCATION:  3014                         FACILITY:  [facility]   PHYSICIAN:  Danae Orleans. [name], M.D.  DATE OF BIRTH:  1946/03/02   DATE OF ADMISSION:  02/01/2009  DATE OF DISCHARGE:  02/10/2009                               DISCHARGE SUMMARY   REASON FOR ADMISSION:  Intractable back pain with pseudoarthrosis of L5-  S1 with status post prior lumbar fusion.   ADDITIONAL DIAGNOSES:  They are copied from the initial diagnoses:  1. Mechanical complication of internal orthopedic device.  2. Thrush.  3. Reaction to artificial implant, encounter for removal of the      internal fixation device.  4. Coronary atherosclerosis of native coronary vessel.  5. Personal history of venous thrombosis and embolism.  6. Atrial fibrillation.  7. Asthma with status asthmaticus.  8. Esophageal reflux.  9. Hyperlipidemia.  10.Hypertension.  11.Knee joint replacement status post.  12.Other unspecified nonspecific immunologic findings.  13.Anticardiolipin antibody.  14.Long term use of aspirin.  15.Urinary tract infection.   FINAL DIAGNOSES:  1. Mechanical complication of internal orthopedic device.  2. Thrush.  3. Reaction to artificial implant, encounter for removal of the      internal fixation device.  4. Coronary atherosclerosis of native coronary vessel.  5. Personal history of venous thrombosis and embolism.  6. Atrial fibrillation.  7. Asthma with status asthmaticus.  8. Esophageal reflux.  9. Hyperlipidemia.  10.Hypertension.  11.Knee joint replacement status post.  12.Other unspecified nonspecific immunologic findings.  13.Anticardiolipin antibody.  14.Long term use of aspirin.  15.Urinary tract infection.   HISTORY OF PRESENT ILLNESS AND HOSPITAL COURSE:  Latoya [name] is a 65-  year-old woman, who has undergone multilevel decompression and fusion of  her  thoracolumbar spine.  She developed a pseudoarthrosis of L5-S1.  It  was elected to take her to surgery, anterior decompression, removal of  interbody cages, and then anterior lumbar fusion.  This was done in  conjunction with Dr. Madilyn Fireman, who performed anterior exposure.  The  patient did well postoperatively, had LDR implant placed.  She had an  episode of atrial fibrillation, was observed in the intensive care unit,  and was gradually mobilized in her back brace, was doing better.  As of  the 20th although she was having difficulty with leg immobilizer brace,  this brace was re-fitted and she was doing better without leg extension.  She was felt to be safe to go home by Cardiology on the 21st and was  discharged home in stable satisfactory condition having tolerated  operation and hospitalization well.   DISCHARGE MEDICATIONS:  Ciprofloxacin 500 mg twice daily for urinary  tract infection.  She has already had Dilaudid and muscle relaxers at home.   She was instructed to follow up in the office in 2-4 weeks  postoperatively.      Danae Orleans. [name], M.D.  Electronically Signed     JDS/MEDQ  D:  04/08/2009  T:  04/09/2009  Job:  [id number]

## 2011-03-06 NOTE — Op Note (Signed)
[name], [name]                ACCOUNT NO.:  [id number]   MEDICAL RECORD NO.:  [id number]          PATIENT TYPE:  INP   LOCATION:  3001                         FACILITY:  [facility]   PHYSICIAN:  Balinda Quails, M.D.    DATE OF BIRTH:  [birth year]   DATE OF PROCEDURE:  02/01/2009  DATE OF DISCHARGE:                               OPERATIVE REPORT   PREOPERATIVE DIAGNOSIS:  L5-S1 pseudoarthrosis.   POSTOPERATIVE DIAGNOSIS:  L5-S1 pseudoarthrosis.   PROCEDURE:  L5-S1 anterior lumbar interbody fusion.   SURGEON:  Balinda Quails, MD   CO-SURGEONDanae Orleans. [name], MD   CLINICAL NOTE:  Latoya [name] is a 65 year old female with a history of  multiple previous spinal procedures.  She has pseudoarthrosis and  chronic back pain at L5-S1.  Scheduled at this time to undergo L5-S1  ALIF.  Seen preoperatively.  The patient does have a history of  antiphospholipid antibody and will require fondaparinux postoperatively.   OPERATIVE PROCEDURE:  The patient was brought to the operating room in  stable condition.  Placed in supine position.  General endotracheal  anesthesia induced.  Foley catheter, arterial line, central venous  catheter in place.  Pulse oximetry on left lower extremity.  In the  supine position, the abdomen prepped and draped in sterile fashion.   A transverse skin incision made across the left lower quadrant to the  premarked projection of L5-S1.  This was advanced through the  subcutaneous tissue with electrocautery.  Left anterior rectus sheath  incised transversely.  The rectus muscle mobilized medially.  Left  retroperitoneal space entered without difficulty.  Peritoneal contents  and left ureter were rotated anteriorly.  The left iliac vessels  reflected laterally.  The L5-S1 disk space could be palpated.  This was  extremely diseased and degenerated.  The soft tissues were then pushed  off the L5-S1 disk space.  The middle sacral vessels controlled with  bipolar cautery  and clips and divided.  The L5-S1 disk was exposed from  left-to-right without difficulty.   Retraction was then carried out with reverse lip blades using a Thompson  retractor system.   At this time, the L5-S1 ALIF was completed by Dr. [name].  Following  this, closure was dictated separately by Dr. [name].   There were no apparent complications during the exposure procedure.      Balinda Quails, M.D.  Electronically Signed     PGH/MEDQ  D:  02/07/2009  T:  02/08/2009  Job:  [id number]   cc:   Danae Orleans. [name], M.D.

## 2011-03-06 NOTE — Discharge Summary (Signed)
[name], [name]                ACCOUNT NO.:  [id number]   MEDICAL RECORD NO.:  [id number]          PATIENT TYPE:  INP   LOCATION:  3027                         FACILITY:  [facility]   PHYSICIAN:  Danae Orleans. [name], M.D.  DATE OF BIRTH:  08-12-1946   DATE OF ADMISSION:  11/12/2006  DATE OF DISCHARGE:  11/16/2006                               DISCHARGE SUMMARY   REASON FOR ADMISSION:  Lumbar spondylosis and pseudoarthrosis at  previous level of previous fusion.  The patient has history of venous  thrombosis and embolism, asthma, esophageal reflux, atrial fibrillation,  coronary atherosclerosis and unspecified immunologic findings of  scoliosis.   FINAL DIAGNOSIS:  Lumbar spondylosis and pseudoarthrosis at previous  level of previous fusion.  The patient has history of venous thrombosis  and embolism, asthma, esophageal reflux, atrial fibrillation, coronary  atherosclerosis and unspecified immunologic findings of scoliosis.   HISTORY OF ILLNESS AND HOSPITAL COURSE:  Latoya [name] is a 65-year-  old woman with lumbar spinal stenosis and scoliosis.  She had previously  undergone decompression and fusion and was admitted to the hospital for  revision of inferior aspect of fusion with removal of a L1 screw and  with posterolateral arthrodesis.  The patient tolerated the procedure  well postoperatively, was mobilized and doing well, was discharged home  with follow-up scheduled in the office.      Danae Orleans. [name], M.D.  Electronically Signed     JDS/MEDQ  D:  02/20/2007  T:  02/21/2007  Job:  [id number]

## 2011-03-06 NOTE — Assessment & Plan Note (Signed)
[facility]                            CARDIOLOGY OFFICE NOTE   [name], Latoya [name]                       MRN:          [id number]  DATE:09/01/2007                            DOB:          February 13, 1946    Latoya [name] returns today for followup.  She has a history of antiphospholipid  syndrome, PAF, asthma, and chronic cervical and lumbar back problems.  She has not had critical coronary artery disease.  In talking to [place],  she is spending more time in a wheelchair than she would like.  She has  had multiple conversations with Dr. Thane [name] regarding any more  possible surgical remedies to her back problems.  Unfortunately, her  primary problem appears to be instability.  She may be a candidate for a  rod-type procedure, but this would need to be done at [facility].  From a  cardiac perspective, she has previously seen Dr. Melvyn [name] for  antiphospholipid syndrome.  She has had previous PE but this was related  to back surgery.  We have stopped her Coumadin about 2-1/2 years ago and  she has not had a recurrent thrombotic event off this.  Given her  chronic back problems and frailty, and with propensity to be unsteady on  her feet, I prefer her to be off Coumadin.  She is in agreement with  this.  She has not had any recurrent palpitations or atrial  fibrillation.  In general, her chronic pain is controlled with a  Duragesic patch.   EXAM:  Remarkable for a frail chronically ill-appearing elderly white  female in no distress.  She has significant kyphoscoliosis.  Blood pressure 140/70, pulse 88 and regular, weight 148 and afebrile.  Respiratory rate 14.  HEENT:  Unremarkable.  Carotids normal without bruit.  No lymphadenopathy, thyromegaly, or JVP  elevation.  LUNGS:  Clear.  Good diaphragmatic motion.  No wheezing.  She has  multiple previous cervical and lumbar scars.  S1, S2 with normal heart sounds.  PMI normal.  ABDOMEN:  Benign.  Bowel sounds positive.  No  AAA.  No tenderness.  No  hepatosplenomegaly or hepatojugular reflux.  Distal pulses intact.  No edema.  NEURO:  Nonfocal.  Mild weakness in the quadriceps and the legs due to  probable difficulty with weightbearing.   CURRENT MEDICATIONS:  1. An aspirin a day.  2. Arthrotec 75 b.i.d.  3. Lopressor 75 b.i.d.  4. Astelin NS t.i.d.  5. Duragesic patch every 2 days.  6. Lipitor 20 a day.  7. Nasacort.  8. Nexium 40 b.i.d.  9. Neurontin 600 a day.  10.Singulair 10 a day.  11.Skelaxin 200 b.i.d.  12.Maxair.   IMPRESSION:  1. Previous paroxysmal atrial fibrillation currently not having      significant palpitations.  Continue aspirin and beta blocker.  2. Previous history of deep vein thrombosis with antiphospholipid      syndrome.  Continue to monitor off Coumadin at this point with no      recurrences.  3. Previous chest pain with no documented significant coronary artery      disease.  Continue risk factor modification.  Followup Myoview in a      year.  4. Hypercholesterolemia.  Continue Lipitor 20 a day, liver and lipid      profile in 6 months.  5. Chronic back problems, follow up with Dr. Reed [name]. Latoya [name].  Continue      Duragesic patch for pain control and Arthrotec for anti-      inflammation.  6. Anxiety and depression.  Continue Skelaxin.  Follow up with primary      care M.D.  7. Peripheral neuropathy in the setting of chronic back problems.      Continue Neurontin 600 b.i.d.  Dose to be escalated per Dr. Venetia [name].   Overall, Latoya [name]'s biggest problem continues to be orthopedic with her  back and her heart appears stable.     Latoya [name]. Latoya Emms, MD, [facility]  Electronically Signed    PCN/MedQ  DD: 09/01/2007  DT: 09/02/2007  Job #: [id number]

## 2011-03-06 NOTE — Consult Note (Signed)
Latoya [name], Latoya [name]                ACCOUNT NO.:  [id number]   MEDICAL RECORD NO.:  [id number]          PATIENT TYPE:  INP   LOCATION:  3112                         FACILITY:  [facility]   PHYSICIAN:  Veverly Fells. Excell Seltzer, MD  DATE OF BIRTH:  1946-10-03   DATE OF CONSULTATION:  02/03/2009  DATE OF DISCHARGE:                                 CONSULTATION   PRIMARY CARDIOLOGIST:  [name]. [name], MD, [facility]   PRIMARY MEDICAL DOCTOR:  Thayer Headings, MD   SPINE SURGEON:  Danae Orleans. [name], MD   GASTROENTEROLOGIST:  Georgiana Spinner, MD   PAIN MANAGEMENT MD:  Kathrin Penner. Vear Clock, MD   REASON FOR CONSULTATION:  AFib with RVR.   HISTORY OF PRESENT ILLNESS:  A 65 year old female with a history of CAD.  Last cath 2003 showing 80% ostial lesion of the diagonal branch and 70%  mid circumflex lesion at the takeoff of the OM, (S/P normal Myoview in  2005), paroxysmal atrial fibrillation (especially postop with increasing  pain), history of DVTs and PE, asthma, GERD, and lumbar spondylosis with  pseudarthrosis (S/P multiple back surgeries including anterior lumbar  decompression after decompression of revision fusion of L5-S1 this  admission, postop day#2), presenting with AFib with RVR, which has now  converted spontaneously back in to sinus after rate control with  metoprolol 75 mg p.o. x1 and 50 mg p.o. q.6 h.  The patient was only in  atrial fibrillation for 2 hours with rates in the 120s and 130s.  This  was the first episode this admission.  The patient unaware of change in  rhythm.   PAST MEDICAL HISTORY:  1. PAF (often with past surgeries and increased in pain).  2. CAD (S/P cath 2003 with 80% ostial lesion in the diagonal branch      and 70% mid circ lesion at the takeoff of the OM.  S/P normal      Myoview 2005).  3. History of DVTs and PE.  4. Asthma.  5. GERD.  6. Lumbar spondylosis.  7. Pseudoarthrosis  8. Antiphospholipid syndrome.  9. Hyperlipidemia.  10.Hypertension.  11.S/P total  left knee arthroplasty.   SOCIAL HISTORY:  The patient lives in [place] with her husband.  She  is disabled.  She has no smoking, EtOH, or illicit drug use.  However,  she does take opioids currently for pain relief.  She takes no herbal  medications.  She has a heart healthy diet.  She exercises nearly every  day doing water aerobics, but is unable to exercise outside of water.   FAMILY HISTORY:  Negative for premature death or coronary artery  disease.  However, father deceased at age 39 with massive MI and few  siblings with coronary artery disease, but no premature death.   REVIEW OF SYSTEMS:  The patient complaining of sore throat, S/P  extubation.  She also has pressure sensation in her ears with mild pain.  She has had worsening depression over the last several months due to her  sister's death recently who she was very close with.  She had shortness  of  breath yesterday, which has resolved.  She denies orthopnea, PND,  lower extremity edema, or chest pain.  She denies presyncope or syncope  as well.  Otherwise see HPI.  All other systems reviewed and were  negative.   ALLERGIES:  1. HEPARIN.  2. LOVENOX.  3. INDOMETHACIN  4. CHLORHEXIDINE.  5. BETADINE.   MEDICATIONS:  1. Aspirin 81 mg p.o. daily.  2. Astelin nasal spray q.i.d.  3. Calcium carbonate 1 tablet p.o. b.i.d.  4. Valium 5 mg p.o. nightly.  5. Colace 100 mg p.o. b.i.d.  6. Duragesic 100 mcg apply q.4-8 h.  7. Fentanyl 500 mcg IV q.4 h. p.r.n.  8. Flonase nasal spray 2 sprays daily.  9. Flovent 1 puff b.i.d.  10.Neurontin 600 mg p.o. nightly.  11.Lopressor 75 mg p.o. x1 and 50 mg p.o. q.6 h.  12.Singulair 10 mg p.o. nightly.  13.Multivitamin 1 tablet daily.  14.Protonix 40 mg p.o. b.i.d.  15.Simvastatin 40 mg p.o. daily.  16.Tylenol 650 mg p.o. q.4 h. p.r.n.  Note, also Chloraseptic,      Flexeril, Benadryl, 4 mg Dilaudid, Atarax, hydroxyzine, lidocaine      patch, Magic mouth wash, cefaclor,  Skelaxin, Narcan, Zofran,      Compazine, senna and Ambien all p.r.n.   PHYSICAL EXAMINATION:  VITAL SIGNS:  Temperature 98.2 degrees  Fahrenheit, BP 81/77, pulse 18, respiration 94% on room air.  GENERAL:  She is alert and oriented x3 in no apparent distress, lying  flat, speaking very easily without respiratory distress.  HEENT:  Head Normocephalic, atraumatic.  Pupils are equal, round, and  reactive to light.  Extraocular muscles are intact.  Nares are patent  without discharge.  Dentition is good.  Oropharynx with erythema, but no  exudates.  NECK:  Supple without lymphadenopathy.  No JVD.  CARDIAC:  Heart rate is regular with audible S1 and S2.  No clicks,  rubs, murmurs, or gallops.  Pulses are 2+ and equal in radials with  absent dorsalis pedis pulses, but good cap refill less than 2 seconds in  lower extremities.  LUNGS:  Clear to auscultation with only chest auscultation secondary to  the patient's recent back surgery.  SKIN:  No rashes, lesions, or petechiae.  ABDOMEN:  Soft, nontender, nondistended.  Normal abdominal bowel sounds.  No rebound or guarding.  No hepatosplenomegaly.  No pulsations.  EXTREMITIES:  No clubbing, cyanosis, or edema.  MUSCULOSKELETAL:  The patient with gross curvature of the spine toward  the patient's left obvious spondylosis; however, no joint effusions.  Spinal and CVA tenderness were not completed secondary to the patient's  status post back surgery.  NEUROLOGIC:  Cranial nerves II through XII are grossly intact.  Strength  5/5 in all extremities.  Axial groups are not tested.  Normal sensation  throughout.  Cerebellar function not tested.   RADIOLOGY:  Chest x-ray January 26, 2009, no acute findings.  Chest x-ray  February 01, 2009, no pneumothorax.  Echocardiogram completed December  2003, showed LVEF equal to 55-65%, no regional wall motion  abnormalities, aortic valve mildly increased.   EKG showed rhythm of atrial fibrillation with RVR, rate  118 bpm.  Axis  is normal.  No acute ST-T wave changes.  No significant Q-waves, voltage  criteria for LVH, QRS 82, QTc 423, no significant change from prior  tracing on January 26, 2009 except for the rate and rhythm.   LABORATORY DATA:  WBC 15.1 with an increase in monocytes, absolute  neutrophils, and absolute monocytes  on differential, HGB 10.2, HCT 29.9,  PLT count 277.  Sodium 139, potassium 3.7, chloride 102, CO2 32, BUN 6,  creatinine 0.46 and glucose 127.   ASSESSMENT AND PLAN:  This is a 65 year old female with history of  paroxysmal atrial fibrillation (especially with increased pain postop),  coronary artery disease with no ischemia per last Myoview in 2005 and  normal ejection fraction per last echocardiogram in December 2003, and  history of lumbar spondylosis, and pseudoarthrosis.  S/P multiple back  surgeries including anterior lumbar decompression of revision fusion at  L5-S1 this admission on postop day#2, presenting with 2 hours of atrial  fibrillation with rapid ventricular response up to the 130s.  Atrial fibrillation with rapid ventricular response.  The patient has  converted back to sinus spontaneously and is having some mild  hypotension now up trending.  We will take off on dose of metoprolol to  25 mg p.o. b.i.d.  We will continue to follow.  Note, attending to  review, see addendum for any changes to assessment and plan.      Jarrett Ables, [facility]      Veverly Fells. Excell Seltzer, MD  Electronically Signed    MS/MEDQ  D:  02/03/2009  T:  02/04/2009  Job:  [id number]

## 2011-03-06 NOTE — Op Note (Signed)
NAMEREANNON, CANDELLA                ACCOUNT NO.:  [id number]   MEDICAL RECORD NO.:  [id number]          PATIENT TYPE:  INP   LOCATION:  3113                         FACILITY:  [facility]   PHYSICIAN:  Balinda Quails, M.D.    DATE OF BIRTH:  Aug 24, 1946   DATE OF PROCEDURE:  03/01/2009  DATE OF DISCHARGE:                               OPERATIVE REPORT   SURGEON:  Balinda Quails, MD   CO-SURGEONDanae Orleans. [name], MD   ASSISTANT:  Stefani Dama, MD   ANESTHETIC:  General endotracheal.   ANESTHESIOLOGIST:  Judie Petit, MD   PREOPERATIVE DIAGNOSIS:  Migrated L5-S1 anterior lumbar interbody fusion  cage.   POSTOPERATIVE DIAGNOSIS:  Migrated L5-S1 anterior lumbar interbody  fusion cage.   PROCEDURE:  L5-S1 anterior lumbar interbody fusion revision.   CLINICAL NOTE:  Latoya [name] is a 65 year old female with a history  of chronic back pain and recent L5-S1 ALIF approximately 2-3 weeks ago.  She represented to Dr. Fredrich Birks office with severe pain.  Workup of this  revealed migration of the prosthesis.  She is brought back to the  operating room at this time for redo procedure with revision of L5-S1  ALIF.   Plan at this time is to expose L5-S1 from the right retroperitoneal  exposure.   OPERATIVE PROCEDURE:  The patient was brought to the operating room in  stable hemodynamic condition.  General endotracheal anesthesia induced.  Foley catheter, arterial line, central venous catheter in place.  Pulse  oximetry on the left foot.   The abdomen prepped and draped in a sterile fashion.   A transverse skin incision made in the right lower quadrant at the  projection of L5-S1.  Subcutaneous tissue divided with electrocautery.  Right anterior rectus sheath incised.  Right rectus muscle mobilized and  retracted laterally.  The right retroperitoneal space was then entered  easily.  Dissection carried bluntly along the psoas muscle.  The  genitofemoral nerve left on the psoas muscle.   The ureter mobilized with  the abdominal contents and retracted medially.  The right common and  external iliac artery were identified.  There was extensive inflammation  noted over the presacral space at the L5-S1 level.  There was a thick  rind of tissue that had formed.  This was an acute inflammatory process.  The margin of the iliac veins bilaterally could not be clearly  identified.  The migrated prosthesis could be palpated.  Dissection was  carried directly down onto the prosthesis and the prosthetic material  was removed.  This exposed the L5-S1 disk space.  The anterior surface  of S1 was then exposed with a combination of sharp and blunt dissection.  Very difficult exposure of L5 was required.  Due to an inability to  really see the margin of the iliac veins, minimal dissection was carried  out to expose adequate portion of L5 for completion of a redo  prosthesis.   At this time, Dr. [name] and Dr. Danielle Dess then completed the revision of L5-  S1 ALIF.  Closure is dictated under separate  heading by Dr. [name].   There were no apparent complications during the exposure procedure.      Balinda Quails, M.D.  Electronically Signed     PGH/MEDQ  D:  03/03/2009  T:  03/03/2009  Job:  [id number]

## 2011-03-09 NOTE — Discharge Summary (Signed)
Latoya [name], Latoya [name]                ACCOUNT NO.:  [id number]   MEDICAL RECORD NO.:  [id number]          PATIENT TYPE:  INP   LOCATION:  3039                         FACILITY:  [facility]   PHYSICIAN:  Latoya [name], M.D.  DATE OF BIRTH:  1945-12-09   DATE OF ADMISSION:  10/31/2004  DATE OF DISCHARGE:  11/08/2004                                 DISCHARGE SUMMARY   REASON FOR ADMISSION:  Kyphotic spinal deformity.   ADDITIONAL DIAGNOSES:  1.  Congestive heart failure.  2.  Atrial fibrillation.  3.  Cardiomyopathy.  4.  Urinary tract infection.  5.  Mechanical complication of internal orthopedic device.  Abnormal      reaction to artificial implant.  6.  Venous embolism, thrombosis of deep vessels, proximal lower extremity.  7.  Lumbar disc replacement.  8.  Lumbosacral spondylosis.  9.  Lumbosacral disc degeneration.  10. Hypertension.  11. Coronary atherosclerosis of native coronary vessel.  12. Asthma with status asthmaticus.  13. Esophageal reflux.  14. Antiphospholipid antibody.   HISTORY OF PRESENT ILLNESS AND HOSPITAL COURSE:  Latoya [name] is a 65-  year-old woman with spinal deformity with kyphotic deformity at L1-L2.  She  has had prior lumbar fusion L3 through L5 levels and has developed a  progressive kyphotic deformity of L1-L2.  It was elected to admit her to the  hospital for correction of kyphotic thoracolumbar deformity with T10 through  S1 fusion.  The patient has significant co-morbidities of cardiomyopathy,  antiphospholipid antibody, history of atherosclerotic coronary artery  disease, history of atrial fibrillation.  She was admitted to the hospital  on the same day as procedure basis and underwent uncomplicated T10 through  S1 fusion with correction of kyphotic deformity with the use of bone  morphogenic protein.  She also has a history of chronic pain.  She was  treated with Dilaudid PCA.  She developed atrial fibrillation which was  evaluated and  tachycardia which was treated with intravenous Lopressor with  improvement of her heart rate.  She was seen by pulmonary consult and had  venous Dopplers of her lower extremities, PAS hose. She has an ALLERGY TO  HEPARIN so she was not given any heparin.  She was gradually mobilized,  observed in intensive care unit and was doing well after intravenous  Cardizem in terms of rate control.  She subsequently developed a urinary  tract infection for which she was treated with ciprofloxacin, Pyridium.  She  was moved out of intensive care on November 06, 2004 and was doing well but  complained of some intermittent leg pain, was felt to be a good candidate  for inpatient rehabilitation and was subsequently transferred to the  rehabilitation service on November 08, 2004.  She had deep venous thrombosis  on Doppler which was discussed with Dr. Jayme [name] and was transferred to the  rehabilitation service doing well on date of discharge with instructions to  follow up with me in my office in one month.   FINAL DISCHARGE DIAGNOSES:  1.  Kyphotic spinal deformity.   ADDITIONAL DISCHARGE DIAGNOSES:  1.  Congestive heart failure.  2.  Atrial fibrillation.  3.  Cardiomyopathy.  4.  Urinary tract infection.  5.  Mechanical complication of internal orthopedic device.  Abnormal      reaction to artificial implant.  6.  Venous embolism, thrombosis of deep vessels, proximal lower extremity.  7.  Lumbar disc replacement.  8.  Lumbosacral spondylosis.  9.  Lumbosacral disc degeneration.  10. Hypertension.  11. Coronary atherosclerosis of native coronary vessel.  12. Asthma with status asthmaticus.  13. Esophageal reflux.  14. Antiphospholipid antibody.      JDS/MEDQ  D:  01/11/2005  T:  01/11/2005  Job:  [id number]

## 2011-03-09 NOTE — H&P (Signed)
NAMEANBERLYN, [name]                ACCOUNT NO.:  [id number]   MEDICAL RECORD NO.:  [id number]          PATIENT TYPE:  EMS   LOCATION:  MAJO                         FACILITY:  [facility]   PHYSICIAN:  Latoya [name], M.D. LHCDATE OF BIRTH:  1946-07-31   DATE OF ADMISSION:  10/21/2004  DATE OF DISCHARGE:                                HISTORY & PHYSICAL   [name] is a pleasant 65 year old patient of Dr. Charlton [name], with a  past medical history of atrial fibrillation, coronary disease,  anticardiolipin antibody, peptic ulcer disease, gastroesophageal reflux  disease, and degenerative joint disease who we were asked to evaluate for  atrial fibrillation.  The patient does have a history of coronary artery  disease.  She had a cardiac catheterization in 2003 that showed a 30% left  main, a 50% LAD, an 80% diagonal, and a 70-80% circumflex.  She has been  treated medically since then and has been asymptomatic with no chest pain or  shortness of breath.  She did have a Cardiolite performed on 10/12/04, that  showed an ejection of 57% and no ischemia or infarct.  The patient also has  a history of anticardiolipin antibody followed at [facility].  She has  been treated with Coumadin in the past, but not in the past two years.  She  states that the hematologist at [facility] has checked her antibody levels and  they are good.  It should be noted that she does have a history of GI  bleed, peptic ulcer disease and gastritis, and that was part of the reason  why her Coumadin was discontinued.  The patient states that she sometimes  develops epigastric pain when she does not eat.  Yesterday, she had a severe  episode.  Subsequently began having palpitations that are similar to her  symptoms with her atrial fibrillation.  Of note, her last bout of atrial  fibrillation was in 5/04.  These palpitations were not associated with chest  pain, but she did describe weakness, presyncope, and shortness of  breath.  They were intermittent throughout the night and today and she came to the  emergency department.  She is presently in normal sinus rhythm.  We were  asked to further evaluate.   HOME MEDICATIONS:  1.  Arthrotec 75 mg p.o. b.i.d. nasal spray.  2.  Atacand 16 mg p.o. daily.  3.  Aspirin 325 mg p.o. daily.  4.  Duragesic patch 100 mcg/h and change q.48h.  5.  Lipitor 10 mg p.o. daily.  6.  Lopressor 75 mg p.o. b.i.d.  7.  Nasacort.  8.  Nexium 40 mg p.o. b.i.d.  9.  Neurontin 600 mg p.o. q.h.s.  10. Iron supplement Ferrex 250 mg p.o. b.i.d.  11. Pulmicort.  12. Singulair.  13. Zyrtec.  14. Cyclobenzaprine as needed.  15. Lidoderm patches.  16. Maxair.  17. NitroTab.  18. Oxycodone.  19. Skelaxin as needed.  20. Mega 3 fish oil capsules.   ALLERGIES:  HEPARIN, LOVENOX, BETADINE, HIBICLENS, AND INDOCIN.  She also  was allergic to pork products.   SOCIAL HISTORY:  She does not smoke, but did at a very young age.  She does  not consume alcohol.   FAMILY HISTORY:  Positive for coronary artery disease.   PAST MEDICAL HISTORY:  She denies any diabetes mellitus or hypertension.  She does have hyperlipidemia.  She has had a prior history of asthma.  She  has a history of coronary artery disease, as well as atrial fibrillation as  outlined in the HPI.  She also has a history of anticardiolipin antibody.  She has a history of peptic ulcer disease and gastritis with GI bleeding.  She also has a history of gastroesophageal reflux disease.  She has had  multiple previous surgeries including eight back surgeries for disk  problems.  She has had prior left knee replacement.  She has had  tonsillectomy.  She has had a hysterectomy.   REVIEW OF SYSTEMS:  She denies any headaches, or fevers or chills.  There is  no brisk cough, hemoptysis.  There is no dysphagia, odynophagia, or  hematochezia.  She does have dark stools from the iron.  There is no  hematuria.  There is no rashes or  seizure activity.  There is no orthopnea  or PND.  She does occasionally have pedal edema, but not at present.  She  has had some cramping in the legs.  The remaining systems are negative.   PHYSICAL EXAMINATION:  Blood pressure 116/79, pulse 98.  She is afebrile.  She is well-developed, well-nourished in no acute distress.  SKIN:  Warm and  dry.  She is not _________.  There is no peripheral clubbing.  HEENT:  Unremarkable with no _________.  NECK:  Supple with no masses bilaterally.  No bruits.  There is no jugular  venous distention.  I could not appreciate thyromegaly.  CHEST:  Clear to auscultation and percussion.  CARDIOVASCULAR:  Regular rate and rhythm, normal S1, S2.  There are no  murmurs, rubs, or gallops noted.  ABDOMEN:  Mild epigastric tenderness to  palpation.  There is no hepatosplenomegaly.  There are no masses appreciated  and no abdominal bruit is noted.  EXTREMITIES:  No edema and I cannot palpable cords.  She has 1+ dorsalis  pedis pulses bilaterally.  NEUROLOGIC:  Grossly intact.   Her electrocardiogram today shows a normal sinus rhythm at a rate of 93.  The axis is normal.  There are no significant ST changes noted.  There is  minimal criteria for left ventricular hypertrophy.  The remaining  laboratories are pending at the time of this dictation.   DIAGNOSES:  1.  Paroxysmal atrial fibrillation.  2.  History of coronary artery disease.  3.  Anticardiolipin antibody.  4.  Peptic ulcer disease.  5.  Gastroesophageal reflux disease.  6.  History of degenerative joint disease.  7.  History of allergy to HEPARIN and LOVENOX.   PLAN:  [name] presents with recurrent atrial fibrillation.  We will  admit to telemetry and follow her rhythm.  We will check enzymes although I  doubt ischemia.  It should also be noted that she had a negative Myoview on  10/12/04.  We will check a TSH as well.  She will need an outpatient echocardiogram.  Blood pressure systolic is  in the 110-120 range.  We will  discontinue he Atacand and I will increase her Lopressor to 100 mg p.o.  b.i.d. for rate control if her atrial fibrillation recurs.  If she is stable  through the night with no  recurrent atrial fibrillation then we will plan to  discharge tomorrow morning and followup in the office.  It should be noted  that this is her first episode of atrial fibrillation since 5/04.  We could  consider an antiarrhythmic in the future if her atrial fibrillation recurs  on a more frequent basis.  For now we will continue with her aspirin.  As  noted previously she has an allergy to Lovenox and heparin.  I am concerned  that she may need Coumadin given her history to paroxysmal atrial  fibrillation and anticardiolipin antibody.  However, she does have no  embolic risk factors including no hypertension, no diabetes mellitus, no  history of CVA, and no decreased LV function.  She is also followed at [facility] for her anticardiolipin antibody and her Coumadin was  discontinued two years ago by the hematologist there.  She does have a  history of GI bleed and now has some epigastric pain and I am therefore  hesitant to begin the Coumadin at this point.  This will need to be  readdressed as an outpatient.  We will need to follow her CBCs and continue  with her Nexium.  She will followup with a dietitian after discharge.       BC/MEDQ  D:  10/21/2004  T:  10/21/2004  Job:  [id number]

## 2011-03-09 NOTE — Assessment & Plan Note (Signed)
[facility]                             PULMONARY OFFICE NOTE   NAME:Latoya, [name]                       MRN:          [id number]  DATE:12/27/2006                            DOB:          1946-04-11    Latoya [name] is seen today in followup.  She had tolerated her surgical  procedure well on thoracic spine.  She is having history of asthma, and  is tolerated inhaled medicines well with no complaints.   She is on:  1. Qvar 80 mcg-strength 2 sprays b.i.d.  2. Singulair 10 mg daily.  3. Zyrtec D b.i.d.  4. Nexium 40 mg b.i.d.  5. Duragesic patch 100 mcg changed every 72 hours.  6. Other medicines are listed in the chart, and are correct as      reviewed.   EXAMINATION:  Temperature was 97.  Blood pressure 120/70.  Pulse 77.  Saturation 99% room air.  CHEST:  Showed to be clear today without evidence of wheeze or rhonchi.  CARDIAC:  Exam showed a regular rate and rhythm without S3.  Normal S1  and S2.  ABDOMEN:  Soft and non-tender.  EXTREMITIES:  Showed no edema or clubbing.  SKIN:  Was clear.  NEUROLOGIC:  Exam was intact.   IMPRESSION:  Is that of moderate persistent asthma, stable at this time.   PLAN:  Maintain inhaled medicines as currently dosed.  We will see the  patient back as needed.     Charlcie Cradle Delford Field, MD, [facility]  Electronically Signed    PEW/MedQ  DD: 12/27/2006  DT: 12/28/2006  Job #: [id number]

## 2011-03-09 NOTE — Op Note (Signed)
Latoya [name], Latoya [name]                ACCOUNT NO.:  [id number]   MEDICAL RECORD NO.:  [id number]          PATIENT TYPE:  INP   LOCATION:  2899                         FACILITY:  [facility]   PHYSICIAN:  Danae Orleans. [name], M.D.  DATE OF BIRTH:  Jan 06, 1946   DATE OF PROCEDURE:  10/31/2004  DATE OF DISCHARGE:                                 OPERATIVE REPORT   PREOPERATIVE DIAGNOSIS:  Kyphotic thoracolumbar deformity with spondylosis,  herniated disk, degenerative disk disease, back pain, and radiculopathy.   POSTOPERATIVE DIAGNOSIS:  Kyphotic thoracolumbar deformity with spondylosis,  herniated disk, degenerative disk disease, back pain, and radiculopathy.   PROCEDURE:  Redo decompression, L1-2, with transverse lumbar interbody  fusion, L1-2 and L5-S1 levels, with interbody graft along with pedicle screw  fixation, T10 through S1 bilaterally, using ST-90 system with morselized  bone autograft, posterolateral arthrodesis with bone morphogenic protein and  __________ graft.   SURGEON:  Danae Orleans. [name], M.D.   Threasa HeadsPhoebe Perch.   ANESTHESIA:  General endotracheal anesthesia.   ESTIMATED BLOOD LOSS:  600 cc of operative blood loss with 1 unit of Cell  Saver blood returned to patient. The patient was given 4 units of packed red  blood cells and 3,000 cc of crystalloid at 175 cc of urine output during the  procedure.   COMPLICATIONS:  None.   DISPOSITION:  Recovery.   INDICATIONS:  Latoya [name] is a 65 year old woman with chronic back  problems. She has had neck surgery and also multiple low back surgeries. She  has anticardiolipin antibody and has intermittent atrial fibrillation. She  developed a kyphotic deformity of the thoracolumbar junction  that was  causing increasing disability to the patient, and after considerable  discussion, it was elected to proceed with redo compression and fusion.   PROCEDURE:  [name] was brought to the operating room. Following  satisfactory and  uncomplicated induction of general endotracheal anesthesia  and placed _________ the patient was placed in the prone position on chest  rolls. An additional sheet roll was placed over her upper chest to extend  her spine and reduce the kyphosis. Her previous incision was reopened and  extended from the T10 level to S1. Beckman-Adson retractors were used to  facilitate exposure. The prior hardware was identified, and the locking caps  were removed as was the rod. The L2 screws which had been previously placed  were loose. The other ones were not. These screws were removed.  Subsequently, the posterolateral region was defined bilaterally from T10  through S1 bilaterally, and prior bone graft was cleared of investing soft  tissue. There appeared to be solid arthrodesis from L3 through L5 levels.  Subsequently, a hemilaminectomy at L5 was performed on the right at the L5-  S1 level, and a diskectomy was performed at this level. Careful dissection  was performed through prior scar tissue. Redo exposure was performed, and a  diskectomy was performed at this level. Subsequently, at the L1-2 level, the  kyphotic deformity was identified. There was a large herniated disk dorsally  which was removed. The entire facet on the  right at L1-2 was removed so that  a transverse exposure to the interspace could be obtained with minimal  retraction of the thecal sac and nerve roots considering that this was the  level of the conus. After diskectomy was performed under loop magnification,  fluoroscopy was then brought into the field, and using fluoroscopic  visualization and end-plate spreader, the end plates were stripped of  residual disk material. A 10-mm PLIF PEAK cage was packed with bone  morphogenic protein. A cage was placed at the L1-2 level with restoration of  the kyphotic deformity and a subsequent cage was placed at the L5-S1 level.  Subsequently, pedicle screws were placed at S1 bilaterally.  Positioning was  confirmed on AP and lateral fluoroscopy. The prior screws at L3 through L5  were maintained. New screws were placed at L2 with 6.75-mm diameter instead  of the prior 5.75-mm diameter screw. The remaining levels of L1, T12, T11,  and T10 were then instrumented with 5.75 x 40 mm screws at each level. All  screws had excellent purchase, and positioning was confirmed on AP and  lateral fluoroscopy. The bony surfaces were then decorticated, and after 260-  mm rods were bent appropriately to fit the varied decompression, the rods  were placed, and locking caps were placed. The construct was locked down in  situ. Remaining bone morphogenic protein was placed with Master graft and  morselized bone autograft in the posterolateral region from T10 through S1,  particularly on the left side of midline. Prior to doing so, hemostasis was  assured, and the wound was copiously irrigated with Bacitracin and normal  saline. The lumbar fascia was then closed with 1 Vicryl suture. Subcutaneous  tissues were reapproximated with 2-0 Vicryl interrupted inverted sutures,  and the skin was reapproximated with interrupted 3-0 Vicryl subcuticular  stitch. The wound was dressed with Benzoin, Steri-Strips, Telfa gauze, and  tape. The patient was extubated in the operating room and taken to the  recovery room in stable and satisfactory condition, having tolerated the  operation well without complication. Counts were correct at the end of the  case.      JDS/MEDQ  D:  10/31/2004  T:  10/31/2004  Job:  [id number]

## 2011-03-09 NOTE — Discharge Summary (Signed)
Latoya [name], Latoya [name]                ACCOUNT NO.:  [id number]   MEDICAL RECORD NO.:  [id number]          PATIENT TYPE:  ORB   LOCATION:  4532                         FACILITY:  [facility]   PHYSICIAN:  Ellwood Dense, M.D.   DATE OF BIRTH:  [birth year]   DATE OF ADMISSION:  11/08/2004  DATE OF DISCHARGE:  11/13/2004                                 DISCHARGE SUMMARY   DISCHARGE DIAGNOSES:  1.  Thoracolumbar deformity secondary to spondylosis requiring decompression      and fusion.  2.  Posterior tibial vein deep venous thrombosis, stable.  3.  Asthma.  4.  Atrial flutter, resolved.  5.  Neuropathy, improved.   HISTORY OF PRESENT ILLNESS:  Ms. Berryhill is a 65 year old female with history  of atrial fibrillation, chronic pain, multiple back surgeries now with  kyphotic thoracolumbar deformity, spondylosis, HNP L2-L3 and L5-S1,  radiculopathy.  Patient elected to undergo redo decompression L1-2 with  transverse fusion L1-2 and L5-S1 graft with screw fixation T2 to S1 graft  with arthrodesis by Dr. [name] on January 10.  Postoperative course has been  complicated by tachycardia and cardiology and critical care medicine  following patient secondary to multiple medical issues.  Cardiology felt  patient with sinus tachycardia, no atrial fibrillation and her beta blocker  was adjusted.  She was treated with IV Cardizem for atrial flutter with  resolution.  No anticoagulation secondary to patient's history to allergies  to heparin, Lovenox, and __________.  Pulmonary was consulted.  Pulmonary  recommended surveillance of PTV with bilateral lower extremity Dopplers.  No  need for IVC filter.  Patient has had complaints of back pain and was  treated with burst of steroids and changed over to Dilaudid for better pain  control as this is a chronic pain.  She was also started on Cipro for  question of UTI.  Patient with hematuria over the weekend.  Therapies were  initiated.  Patient noted to be at [facility]  assist for transfers, min to standby  assist ambulating 200 feet with rolling walker, requiring constant queuing  for posture.   PAST MEDICAL HISTORY:  1.  Hypertension.  2.  CHF.  3.  Asthma.  4.  Anemia.  5.  Left total knee replacement.  6.  PE in 1985.  7.  Lumbar laminectomy in 1994.  8.  Ray cage in 1998.  9.  Fusion in 2000.  10. Diverticulosis.  11. Vertigo.  12. PUD.  13. GI bleed.  14. ICM.  15. Cleft palpate repair at age 58.  5. Hysterectomy.  17. Raynaud's phenomenon.  18. Anticardiolipin syndrome.  19. Spina bifida repaired __________.  20. C-spine decompression in 1998, 1999, and 2001.   ALLERGIES:  CONTRAST DYE, HEPARIN, BETADINE, HIBICLENS, INDOCIN, LOVENOX,  __________, PORK.   FAMILY HISTORY:  Positive for diabetes, coronary artery disease, and colon  cancer.   SOCIAL HISTORY:  Patient is married.  Lives in one-level home with three  steps at entry.  Husband very supportive.  She does not use any tobacco or  alcohol.   HOSPITAL COURSE:  __________ on  November 08, 2004 for SACU level therapies to  consist of PT/OT daily.  Pain control was managed with use of a fentanyl  patch at 100 mg q.76 hours with Neurontin q.h.s. and p.r.n. Dilaudid.  Pulmonary has been following on patient in regards to patient's left DVT.  They felt patient with history of repeated episodes of HRT in the past to  treat patient with Arixtra at lower dose 5 mg a day while on subacute with  follow-up Dopplers before discharge.  Patient was adamantly against a filter  or further invasive procedure for treatment of DVT.  The patient's pain  control was reasonable.  Follow-up laboratories include check of CBC showing  H&H at 9.2/28.1.  Leukocytosis was noted to be resolving with white count at  18.9.  Check of lytes revealed sodium 137, potassium 3.8, chloride 101, CO2  29, BUN 15, creatinine 0.4, glucose 49.  Patient's respiratory status was  stable on Pulmicort MDIs and Singulair.   Patient was able to tolerated a low  dose Arixtra without difficulty.  Follow-up Dopplers of January 23 showed  posterior tibial vein thrombosis without propagation.  Pulmonary felt  patient could be discontinued off of anticoagulation and off of Arixtra.  Patient did have follow-up UA done on January 18 showing insignificant  growth.   During her stay in subacute patient made good progress.  She does require  queuing and encouragement to continue wearing her brace, continue more  adherent to her back precautions.  At time of discharge patient was at  modified independent level for upper body care, modified independent level  for lower body care, modified independent for toileting, modified  independent for transfers, modified independent for ambulating 300 feet with  a rolling walker, request supervision to navigate five stairs.  Husband will  provide supervision and assistance needed past discharge.  Patient to  continue with outpatient PT beginning Tuesday, January 26 at 12 noon.  On  November 13, 2004 patient is discharged to home.   DISCHARGE MEDICATIONS:  1.  Lipitor 10 mg q.h.s.  2.  Lidocaine patch on 12 hours, off 12 hours to back.  3.  Prilosec 10 mg a day.  4.  Neurontin 300 mg two p.o. q.h.s.  5.  Singulair 10 mg a day.  6.  Pulmicort 200 mg two puffs b.i.d.  7.  Nasonex one squirt twice a day.  8.  Astelin 137 mcg two squirts daily.  9.  Nexium 40 mg b.i.d.  10. Fentanyl patch 100 mcg change q.48h.  11. Nu-Iron 150 mg b.i.d.  12. Lopressor 100 mg q.8h.  13. Dilaudid 4-8 mg q.4-6h. p.r.n. pain.  14. Aspirin 81 mg a day.   ACTIVITY:  Routine back precautions.  Use TLSO to be donned at edge of bed.   DIET:  Regular.   WOUND CARE:  Keep area clean and dry.   SPECIAL INSTRUCTIONS:  No alcohol.  No driving.  Do not use Atacand.  Outpatient PT beginning January 26 at 12 noon.  FOLLOW-UP:  Patient to follow up with Dr. Roseanne Reno in two to three weeks.  Follow up with Dr.  [name] for routine check.  Follow up with Dr. Delford Field in  two to three weeks.  Follow up with Dr. Thyra Breed for pain medications.      PP/MEDQ  D:  01/25/2005  T:  01/25/2005  Job:  [id number]   cc:   Areatha Keas, M.D.  [address]  [address]  [place]  [place] [address]  Fax: [phone]   Shan Levans, M.D. [facility]   Mark L. Vear Clock, M.D.  [address]  [place]  [place] [address]  Fax: [phone]

## 2011-03-09 NOTE — Assessment & Plan Note (Signed)
[facility]                            CARDIOLOGY OFFICE NOTE   NAME:[name], Latoya GASPARYAN                       MRN:          [id number]  DATE:11/06/2006                            DOB:          [birth year]    Latoya [name] returns today for followup.  She needs recurrent back surgery by  Dr. Linzie Collin.  She was sent here for preop clearance.   I have followed the patient for a long time.  She does have a history of  coronary disease by catheterization in December of 2003.  She had a 50%  LAD and an 80% small diagonal and 80% circ stenosis.   Her last Myoview in 2005 was nonischemic, with good LV function.   She has not had any recent chest pain.  She has tolerated her most  recent surgeries well.  She has a distant history of anti-phospholipid  syndrome.  She sees a Dr. Marney Doctor I believe in [place] for this.  We  had taken her off Coumadin quite a few years ago and she has done well,  without any thrombotic complications.  I asked her if anyone has checked  her hyper-coagability panel recently and she says that Dr. Marney Doctor is  following her levels and they have been fine and he is comfortable  with her being off Coumadin.   Her blood pressure tends to run a little high and she has had some PAF  and palpitations in the past.  We have her on Lopressor 75 b.i.d.   The patient is allergic to HEPARIN, LOVENOX, BETADINE, _________, and  INDOCIN.   She needs a repeat lumbosacral spine surgery for failure to fuse.   MEDICATIONS:  Her medications are multiple and listed in the chart.  From a heart standpoint, she is on Lopressor 75 b.i.d., an aspirin a  day, and Lipitor 20 a day.   Her postop pain control will likely be somewhat difficult.  She is on a  chronic Duragesic patch, Dilaudid, Neurontin and Lidoderm patches.   PHYSICAL EXAMINATION:  Her exam today is remarkable for elderly  appearing woman in no distress.  She has significant back deformity.  Blood pressure is 130/70, pulse is 80 and regular.  HEENT:  Normal.  There is no carotid bruit.  LUNGS:  Clear.  She is status post previous lumbar back surgery.  HEART:  There is an S1, S2.  Normal heart sounds.  ABDOMEN:  Benign.  EXTREMITIES:  Intact pulses, no edema.   Her baseline EKG is essentially normal.   IMPRESSION:  Previous coronary artery disease, no active angina, no need  for followup Myoview.  The patient will continue her beta blocker and  aspirin.  She knows to take her Lopressor right up until the morning of  surgery.  I think her biggest risk in regards to her heart is PAF.  We  will continue her beta blocker for this.  She will need PSA hose and  some sort of anticoagulation postop to prevent DVT, particularly since  there is an issue with anti-phospholipid syndrome.  We have talked about  this in the past and Dr. Marney Doctor feels comfortable having her off  Coumadin.  She has not had any significant thrombotic events recently.   In regards to her heart, otherwise I think she is doing well.  She has  tolerated her last back surgery fine and will need a telemetry bed  postop.     [name]. [name], MD, [facility]  Electronically Signed    PCN/MedQ  DD: 11/06/2006  DT: 11/06/2006  Job #: [id number]

## 2011-03-09 NOTE — Procedures (Signed)
[facility]  Patient:    Latoya [name], Latoya [name]                       MRN: [id number] Proc. Date: 03/27/01 Adm. Date:  [id number] Attending:  Sabino Gasser                           Procedure Report  PROCEDURE:  Colonoscopy.  INDICATIONS:  Colon polyps.  ANESTHESIA:  Demerol 70 mg, Versed 6 mg.  DESCRIPTION OF PROCEDURE:  With the patient mildly sedated in the left lateral decubitus position, the Olympus videoscopic colonoscope was inserted into the rectum and passed under direct vision to the cecum, identified by the ileocecal valve and appendiceal orifice.  We saw the terminal ileum which also appeared normal.  From this point, the colonoscope was slowly withdrawn taking circumferential views of the entire colonic mucosa stopping only then in the rectum which appeared normal on direct view and on retroflex view showed very small internal hemorrhoids.  The endoscope was straightened and withdrawn. The patients vital signs and pulse oximeter remained stable.  The patient tolerated the procedure well without apparent complications.  FINDINGS:  Small internal hemorrhoids.  Otherwise unremarkable colonoscopic examination including the terminal ileum.  PLAN:  Repeat examination in five years. DD:  03/27/01 TD:  03/27/01 Job: [id number]

## 2011-03-09 NOTE — H&P (Signed)
Latoya [name], Latoya [name]                ACCOUNT NO.:  [id number]   MEDICAL RECORD NO.:  [id number]           PATIENT TYPE:   LOCATION:                                 FACILITY:   PHYSICIAN:  Nelma Rothman, MD        DATE OF BIRTH:   DATE OF ADMISSION:  11/19/2005  DATE OF DISCHARGE:                                HISTORY & PHYSICAL   PRIMARY CARE PHYSICIAN:  Dr. Phylliss Bob   CHIEF COMPLAINT:  Nausea and vomiting.   HISTORY OF PRESENT ILLNESS:  Latoya [name] is a 65 year old female with  multiple medical problems as will be detailed below who presents with 24  hours of nausea, vomiting, and loose stool.  She states that she had a  similar experience two weeks ago which was not as severe which resolved  spontaneously after three days.  There is no blood in her stool as far as  she knows but she takes iron tablets so her stools are always dark.  Her  vomitus is primarily undigested food or bilious.  She also complains of  right upper quadrant pain which is new.  The patient states that she was  told she had a benign liver cyst years ago, although cannot recall how this  was diagnosed, but is able to state that it was never biopsied.  Abdominal  ultrasound here in the emergency department showed a 4 cm hepatic mass so we  are asked to admit the patient for IV fluids and further evaluation of  nausea and vomiting and hepatic mass.  Patient is up to date on her cancer  screening with her last mammogram in June of last year and a colonoscopy  some time within the past five years.  In the emergency department thus far  she has received Zofran 4 mg x2 and Pepcid 20 mg IV as well as a normal  saline bolus.   PAST MEDICAL HISTORY:  1.  Severe osteoarthritis and degenerative disk disease.  2.  Status post multiple spinal fusions.  3.  Antiphospholipid antibody syndrome.  4.  Anemia probably secondary to a GI bleed which was worked up thoroughly      by Dr. Virginia Rochester, but no source ever found per  patient.  5.  Coronary artery disease which was diagnosed preoperatively by cardiac      catheterization, although patient states that she did not require stent      placement or any kind of bypass surgery.  6.  Asthma.  7.  DVT which was below the knee during a hospitalization for one of her      spinal fusions which was not anticoagulated as an outpatient.   SOCIAL HISTORY:  She lives at home with her husband.  They have no children  and she did have miscarriages in past.  She denies any tobacco or alcohol  use.  She is not currently working, although previously worked as a  Paramedic with the chronically medically ill.   FAMILY HISTORY:  There are heart problems in both her mother and father.  Her father died  at age 27 of an MI and her mother died of an infection, but  also had heart trouble.   REVIEW OF SYSTEMS:  No fevers or chills.  She does have some chronic back  pain but except for as noted in HPI is otherwise negative.   ALLERGIES:  PORK PRODUCTS, TOPICAL BETADINE, INDOCIN, HEPARIN or LOW  MOLECULAR WEIGHT HEPARIN.   MEDICATIONS:  1.  Arthrotec 75 mg p.o. b.i.d.  2.  Astelin nasal spray b.i.d.  3.  Aspirin 81 mg p.o. daily.  4.  Duragesic patch 100 mcg transdermally changed every 48 hours.  5.  Lipitor 20 mg p.o. daily.  6.  Metoprolol 75 mg p.o. b.i.d.  7.  Nasacort AQ b.i.d.  8.  Neurontin 600 mg p.o. q.h.s.  9.  Ferrex tablets 250 mg p.o. b.i.d.  10. Pulmicort two puffs q.12h.  11. Singulair 10 mg p.o. q.h.s.  12. SoluTabs p.o. q.h.s.  13. Zyrtec D one tablet b.i.d.  14. Colace p.r.n.  15. Calcium vitamin D 600 mg p.o. b.i.d.  16. Fish oil tablets one daily.  17. Glucosamine and chondroitin.  18. Prevacid 30 mg p.o. daily.   PHYSICAL EXAMINATION:  VITAL SIGNS:  Temperature 98.1, pulse 94, blood  pressure 120/60, respiratory rate 16 with oxygen saturation of 97% on room  air.  GENERAL:  She is in no apparent distress.  HEENT:  Mucous membranes are moist.   There is no exudate.  NECK:  Supple.  There is no jugular venous distention.  There is no palpable  cervical or supraclavicular lymphadenopathy.  HEART:  Regular rate and rhythm with no murmurs, rubs, or gallops.  LUNGS:  Clear to auscultation bilaterally.  ABDOMEN:  Soft with some right upper quadrant tenderness to deep palpation,  but is nondistended.  There are quiet bowel sounds.  There is no  hepatosplenomegaly.  EXTREMITIES:  There is no edema.  LYMPH NODE:  No palpable cervical, supraclavicular, axillary, or inguinal  lymphadenopathy.   LABORATORY DATA:  White blood cell count 9.1, hemoglobin 13.8, hematocrit  39.8, platelet count 363.  Sodium 138, potassium 3.8, chloride 104,  bicarbonate 29, BUN 16, creatinine 0.6, glucose 108.  Total bilirubin 0.6,  alkaline phosphatase 77, AST 36, ALT 36, lipase 29, protein 5.9, albumin  3.4, calcium 8.8.  Urinalysis demonstrates trace leukocytes and rare  bacteria, but is otherwise negative.  Abdominal ultrasound showed a  gallbladder which was within normal limits.  There is a 4.7 cm mass in the  central right lobe of the liver, cannot exclude neoplasm.  Common bile duct  appeared to be dilated raising the question of biliary obstruction.   ASSESSMENT/PLAN:  1.  Nausea and vomiting.  Differential includes acute gastroenteritis versus      gastritis versus related to hepatic mass.  We will order a CT to further      evaluate.  Her liver function tests are within normal limits.  Will      continue      anti-emetics and intravenous fluids.  2.  Hepatic mass.  Will get a CT for further evaluation.  3.  History of multiple spinal fusions and degenerative disk disease.  Will      continue home pain medications.      Nelma Rothman, MD  Electronically Signed     RAR/MEDQ  D:  11/19/2005  T:  11/19/2005  Job:  [id number]

## 2011-03-09 NOTE — Discharge Summary (Signed)
NAME:  Latoya [name], Latoya [name]                          ACCOUNT NO.:  [id number]   MEDICAL RECORD NO.:  [id number]                   PATIENT TYPE:  INP   LOCATION:  2002                                 FACILITY:  [facility]   PHYSICIAN:  [place] Bing, M.D.               DATE OF BIRTH:  1946-05-18   DATE OF ADMISSION:  02/25/2003  DATE OF DISCHARGE:  02/26/2003                           DISCHARGE SUMMARY - REFERRING   PROCEDURE:  None.   REASON FOR ADMISSION:  The patient is a 65 year old female with a history of  paroxysmal atrial fibrillation and supraventricular tachycardia, moderate  coronary artery disease with normal left ventricular function, and  anticardiolipin antibody syndrome who had been recently seen in the office  on several occasions for management of worsening lower extremity edema and  hypertension.  Medications were adjusted to correct documented hypotension  (a systolic mid 90s) with associated weakness and presyncope; however, two  days later, patient presented to [facility] emergency room with recurrence  of tachy palpitations identified as probable recurrent paroxysmal atrial  fibrillation at a rate of approximately 160 BPM.  She was treated initially  with intravenous Diltiazem with slowing of the rate to a sinus tachycardia  in the 120-BPM range.  Recommendation was to admit for overnight observation  and further evaluation and recommendations per Dr. Sherryl Manges, who had  seen patient in the past for tachyarrhythmia.  Please refer to dictated  admission note for full details.   LABORATORY DATA:  WBCs 19.3, HGB 12.1, platelets 495.  Sodium 135, potassium  4.5, glucose 106, BUN 14, creatinine 0.8.  Serial cardiac enzymes normal.  BNP 180.  Liver enzymes normal; decreased albumin 2.5.  TSH 1.3.  Urinalysis  negative, save for ketones.   Admission CXR:  No acute disease.   HOSPITAL COURSE:  Following admission, patient maintained normal sinus  rhythm, and intravenous  Diltiazem was subsequently discontinued.  No  additional medication adjustments were made to the previous home medication  regimen.  Patient did complain of some chest pain, but serial cardiac  enzymes were negative.   Patient also had reported some low-grade fever at home, had seen her  allergist (Dr. Waverly Callas) earlier on day of admission, and was placed on  amoxicillin.  Her chest x-ray did not show any acute process, but patient  did have bronchitic-like changes on examination and was initially placed on  azithromycin (patient had not started Augmentin).  Recommendation at time of  discharge, however, was to initiate Augmentin therapy for the 10 days, as  directed.  Patient remained afebrile during her brief stay.   The following morning, patient was seen by Dr. Sherryl Manges, who had  evaluated the patient back in December when she developed new-onset  postoperative paroxysmal atrial fibrillation with rapid ventricular  response.  Dr. Graciela Husbands reviewed the strips and felt that this represented left  atrial-focused paroxysmal atrial fibrillation.  He recommended  continuation  of Lopressor 50 mg b.i.d. in conjunction with Cardizem 240 mg daily.  He  did, however, recommend discontinuation of Maxzide, given the patient's  recent difficulty with hypotension.   Additionally, Dr. Graciela Husbands stated that if patient were to have any recurrent  tachy palpitations in context with fever, to double the current dose of  Lopressor.  If, however, patient had recurrent tachyarrhythmia in the  absence of fever, then he suggested treatment with dofetilide.  He also  recommended getting additional input from the patient's physician at [facility],  where patient is followed for anticardiolipin antibody syndrome, regarding  Coumadin anticoagulation.   DISCHARGE MEDICATIONS:  1. Lopressor 50 mg b.i.d.  2. Cardizem 240 mg daily.  3. Augmentin XR two tablets q.12h. (times 10 days).  4. Atacand 16 mg daily.  5. Coated  aspirin 325 mg daily.  6. Arthrotec 75 mg b.i.d.  7. Duragesic patch 0.1 mg/hour q.48h.  8. Lipitor 10 mg daily.  9. Nexium 40 mg daily.  10.      Neurontin 600 mg q.h.s.  11.      Pulmicort MDI as directed.  12.      Singulair 10 mg q.h.s.  13.      Zyrtec one tablet q.h.s.  14.      Astelin nasal spray twice daily.  15.      Nasacort AQ nasal spray twice daily.   INSTRUCTIONS:  1. Patient is to stop taking Maxzide.  2. Patient is to double her dose of Lopressor if she has recurrent tachy     palpitations with fever.   FOLLOW UP:  Patient is to keep her previously scheduled appointment with  Guy Franco, P.A.C. on Mar 04, 2003.  She will then continue cardiac followup  with Dr. Charlton Haws and with Dr. Sherryl Manges as needed.   DISCHARGE DIAGNOSES:  1. Recurrent paroxysmal atrial fibrillation.     A. Left atrial focus - converted to sinus rhythm with intravenous        Diltiazem.     B. History of paroxysmal atrial fibrillation with rapid ventricular        response (postoperative), December '03.  2. Anticardiolipin antibody syndrome.     A. Followed at [facility].  3. Moderate coronary artery disease.     A. Coronary angiogram, December '03.  4. Normal left ventricular function.  5. Leukocytosis.  6.     History of asthma.  7. Chronic back pain.     A. Status post multiple lower back surgeries.     Gene Serpe, P.A. [facility]                      [place] Bing, M.D.    GS/MEDQ  D:  02/26/2003  T:  02/27/2003  Job:  [id number]   cc:   Duke Salvia, M.D.   Percival Spanish, M.D.  [address]  [place]  [place] [address]  Fax: [phone]   Areatha Keas, M.D.  [address]  [address]  [place]  [place] [address]  Fax: [phone]

## 2011-03-09 NOTE — Assessment & Plan Note (Signed)
[facility]                             PULMONARY OFFICE NOTE   NAME:HONISSKorri, Ask                       MRN:          [id number]  DATE:10/30/2006                            DOB:          [birth year]    [name] is a 65 year old white female who is seen for pre-op  clearance for lumbar fusion surgery revision.  We had seen her  previously for asthma.  She also has a history of HEPARIN ALLERGIES,  which caused increased clotting and bleeding, and thrombocytopenia.  She  underwent surgery previously for her back, and we followed her during  this interval of time in 2006.  She did well with this and did not have  significant complications.  She has a history of moderate resistant  asthma, being currently maintained on:  1. Qvar 80 mcg strength 2 sprays b.i.d.  2. Astelin 2 sprays each nostril b.i.d.  3. Nasacort 2 sprays b.i.d. each nostril  4. Singulair 10 mg daily.  5. And she has a Maxair inhaler p.r.n.  6. Also maintains aspirin 81 mg daily  7. Arthrotec b.i.d.  8. Metoprolol 75 mg b.i.d.  9. Duragesic patch changed every 2 days 100 mcg.  10.Lipitor 20 mg daily.  11.Nexium 40 mg b.i.d.  12.Neurontin 600 mg at bedtime.  13.Zyrtec b.i.d.   EXAM:  Temperature was 97.8, blood pressure 136/82, pulse 84, saturation  97% on room air.  CHEST:  Diminished breath sounds.  No evidence of wheeze or rhonchi.  CARDIAC:  Regular rate and rhythm without S3.  Normal S1, S2.  ABDOMEN:  Soft.  Nontender.  EXTREMITIES:  No edema or clubbing.  SKIN:  Clear.  BACK:  Significant deformity to the lower lumbar spine.   Spirometry was obtained showed normal spirometry with an FEV1 of 96%  predicted, FVC of 102% predicted.   IMPRESSION:  1. Normal spirometry.  2. Stable asthma.  3. History of hypercoagulability following an adverse reaction to      HEPARIN AND LOVENOX WITH ALLERGY.   RECOMMENDATIONS:  Cleared.  The patient was cleared for surgical  intervention per Dr. [name].  We need to advise Dr. [name] to avoid  Lovenox and heparin products in this patient.  According to her  hematologist at [facility], she could receive Arixtra, but we will leave this  up to Dr.  [name] to determine in consultation with Dr. Mancel Bale if necessary.  At this point, we will plan to see the patient in the hospital following  back surgery.     Charlcie Cradle Delford Field, MD, [facility]  Electronically Signed    PEW/MedQ  DD: 10/31/2006  DT: 10/31/2006  Job #: [id number]   cc:   Danae Orleans. [name], M.D.  Areatha Keas, M.D.  Leighton Roach. Truett Perna, M.D.  [name]. [name], MD, [facility]

## 2011-03-09 NOTE — Assessment & Plan Note (Signed)
[facility]                              CARDIOLOGY OFFICE NOTE   NAME:Mestre, REMEDY CORPORAN                       MRN:          [id number]  DATE:08/12/2006                            DOB:          30-Nov-1945    Latoya [name] is seen today in followup.  She has antiphospholipid syndrome and  is currently off Coumadin without any sequela.  She also has had PAF and is  maintaining sinus rhythm.  She has chronic back problems and may need yet  another surgery by Dr. Danae Orleans. Latoya [name].  She has no documented coronary  artery disease.  She is not having any significant chest pain, PND,  orthopnea, or lower extremity edema.   From a cardiac perspective, she has not noticed any palpitations.   There has also been no thrombotic syndromes off Coumadin.   EXAM:  She is kyphotic.  She has chronic back problems.  Blood pressure is 120/80, pulse 75 and regular.  LUNGS:  Clear.  Carotids are normal. There is no thyromegaly.  There is no lymphadenopathy.  SKIN:  Warm and dry.  There is an S1 and S2 with a soft systolic murmur.  ABDOMEN:  Benign.  LOWER EXTREMITIES:  Intact pulses.  No edema.   EKG is normal.   She is on multiple medications, which are listed in the chart.  From a  cardiac perspective, she is on Lipitor 20 a day, Lopressor 75 b.i.d.   There is a question of allergy to HEPARIN, LOVENOX, BETADINE, and DYE.   IMPRESSION:  Stable.  She has not had a stress test in a while, but she is  asymptomatic.  She has tolerated her previous back surgeries well.  I do not  think she needs a stress test.  She did tell me she is thinking of going to  [place] for a prolonged time after her surgery, so we may do a stress test  prior to her leaving in the spring.   In regards to her upcoming back surgery, I think she is cleared.  She will  continue her beta blocker, including up to the time of surgery and  continuing an aspirin a day.   Given her problems with heparin  and Lovenox in the past, she may need  Refludan in the peri-op period.   I explained to her, her biggest risk in the peri-op period would be  paroxysmal atrial fibrillation, for which we will continue her beta blocker  therapy.   We would be happy to follow her in the hospital.    ______________________________  [name]. [name], MD, [facility]    PCN/MedQ  DD: 08/12/2006  DT: 08/13/2006  Job #: [id number]

## 2011-03-09 NOTE — Op Note (Signed)
NAMEHONEST, SAFRANEK                ACCOUNT NO.:  [id number]   MEDICAL RECORD NO.:  [id number]          PATIENT TYPE:  INP   LOCATION:  3172                         FACILITY:  [facility]   PHYSICIAN:  Danae Orleans. [name], M.D.  DATE OF BIRTH:  04/09/1946   DATE OF PROCEDURE:  11/12/2006  DATE OF DISCHARGE:                               OPERATIVE REPORT   PREOPERATIVE DIAGNOSIS:  Pseudoarthrosis at L5-S1 with kyphosed  scoliosis and anticardiolipin antibody, low back pain and lumbar  radiculopathy.   POSTOPERATIVE DIAGNOSIS:  Pseudoarthrosis at L5-S1 with kyphosed  scoliosis and anticardiolipin antibody, low back pain and lumbar  radiculopathy.   PROCEDURE:  Exploration with revision of L5-S1 pedicles, fixation,  removal of left L1 screw and posterolateral arthrodesis L4-S1  bilaterally using bone morphogenic protein, Mastergraft and Actifuse.   SURGEON:  [name].   ASSISTANT:  Poteat, R.N.   ANESTHESIA:  General endotracheal anesthesia.   BLOOD LOSS:  150 mL.   COMPLICATIONS:  None.   DISPOSITION:  To recovery.   INDICATIONS:  Latoya [name] is a 65 year old woman with multiple  medical problems including our anticardiolipin body.  She has difficulty  healing from previous surgeries.  She had a severe kyphosed scoliosis of  the thoracal lumbar spine for which she underwent T10-S1 fusion several  years ago.  She then developed lucency around the S1 screws.  Other  levels appeared to be solidly healed.  It was elected to take her to  surgery for revision of the inferior aspect of her previous fusion.  Additionally, there was a left L1 screw that breached the pedicle  identified on CT scan, and it was consequently felt to remove this  screw.   PROCEDURE:  Latoya [name] was brought to the operating room.  Following  satisfactory and uncomplicated induction of general endotracheal  anesthesia and placement of intravenous lines and Foley catheter, the  patient was placed in a prone  position on the operating table on chest  rolls.  Her thoracic and lumbar spine were then prepped and draped usual  sterile fashion.  The area of planned incision was infiltrated with  0.25% Marcaine and 0.50% lidocaine with 1:200,000 epinephrine.  Incision  was made in the midline through a previous incision and carried through  soft tissues to the previously placed hardware.  This was 90D  instrumentation. The locking caps were removed from each of the screws  as was the cross connector, and the rods were removed.  Subsequently,  the S1 screws were both removed.  These were appeared to be loose.  There also appeared to be some mild loosening around the right L5 screw.  The left L1 screw was removed and the L5 and S1 screws were removed  bilaterally.  Using osteotomes and curettes, the previously placed  posterolateral bone graft was removed.  There did appear to be  ossification, but there was not contiguous bony fusion, and therefore,  with exploration of the fusion, it appeared that there was indeed a  pseudoarthrosis at L5-S1 at the L5-S1 level.  Subsequently, using  osteotomes, the L4 and L5  and S1 lateral aspects of the facet joint  complexes were decorticated as was the transverse processes and sacral  alae, and this cortical cancellus bone was left to line the site for use  with bone grafting, additionally supplemental  bone morphogenic protein  Mastergraft.  After Mastergraft was then placed overlying the  decorticated cortical cancellus bone and subsequently additional  Actifuse was placed overlying this.  A left-sided pedicle screw was  redirected with a 6.75 x 50-mm screw with good purchase, and this was  intentionally placed bicortically, and on the rig, a 7.75 x 50-mm screw  was placed through the previous site again with bicortical purchase and  appeared to solidly grab the bone. A 6.75 x 45-mm screw was placed at L5  on the left, and additionally a similarly sized screw  was initially  placed on the right, but it appeared to be loose, and this was exchanged  for 7.75 x 45-mm screw.  Thoracic rods were then lordosed over the  lumbar portion and kyphosed over the thoracic portion, and these were  then fit into the screw heads.  Locking caps were placed, and construct  was locked down in situ.  There, a medium cross connector was placed in  the site of previous cross connector.  Wound had been previously  irrigated, and after placing bone morphogenic protein, the thoracolumbar  fascia was closed with 1 Vicryl sutures.  Subcutaneous tissues were  approximated with 2-0 Vicryl interrupted inverted sutures, and the skin  edges were approximated with interrupted 3-0 Vicryl subcuticular stitch.  The wound was dressed with Benzoin, Steri-Strips, Telfa gauze and tape.  The patient was extubated in the operating room and taken to the  recovery room in stable satisfactory condition, having tolerated the  operation well.  Counts were correct at the end of the case.      Danae Orleans. [name], M.D.  Electronically Signed     JDS/MEDQ  D:  11/12/2006  T:  11/12/2006  Job:  [id number]

## 2011-03-09 NOTE — H&P (Signed)
[facility]  Patient:    Latoya [name], Latoya [name]                     MRN: [id number] Adm. Date:  [id number] Attending:  Thyra Breed CC:         Danae Orleans. [name], M.D.  Dr. Bo Mcclintock, [employer]   History and Physical  FOLLOWUP EVALUATION  HISTORY OF PRESENT ILLNESS:  [name] comes in today on Duragesic.  She seems to be tolerating this fairly well.  She initially noted that it did nauseate her and make her sleepy, but this seems to be lifting somewhat.  She is using the Duragesic 75 mcg q.2 days.  She rates her pain today at a level of 4/10 which is identical to her last visit.  She does feel as though her neck pain is significantly resolved.  She is having a lot of lower back discomfort and feels as though she has ongoing degeneration of her disc.  With prolonged riding, she does develop some numbness down the posterior aspects of her legs bilaterally.  She has seen by Dr. [name] since her last visit, and he advised her that she likely was going to need another fusion in the L5/S1 region.  She also has seen Dr. Lucas Mallow who has started her on Coumadin.  He did some lab work and gave her her echo studies for review.  CURRENT MEDICATIONS: Unchanged except for the shift to Duragesic over OxyContin.  PHYSICAL EXAMINATION:  VITAL SIGNS:  Blood pressure 149/82, heart rate 86, respiratory rate 18, and O2 saturation 100%.  Pain level 4/10.  NEUROLOGIC:  Her deep tendon reflexes in the lower extremities were symmetric. Motor is intact.  IMPRESSION: 1. Chronic neck pain status post fusions, stable on current Duragesic dose. 2. Lower back pain which I suspect is on the basis of degenerative changes at    L5/S1, which seems more symptomatic.  DISPOSITION: 1. Continue on current medications.  She does not need a prescription for her    Duragesic. 2. Trial of apple cider, vinegar, and honey. 3. She is planning to see Dr. Bo Mcclintock at [facility] in the near  future with    regard to her antiphospholipid antibody syndrome. 4. Followup with me in four weeks, at which time we will consider adjusting    the Duragesic to try and get her lower back under a little better control    if the apple cider/vinegar/honey, does not help. DD:  04/07/01 TD:  04/07/01 Job: [id number]

## 2011-03-09 NOTE — H&P (Signed)
[name], [name]                          ACCOUNT NO.:  [id number]   MEDICAL RECORD NO.:  [id number]                   PATIENT TYPE:  INP   LOCATION:  2002                                 FACILITY:  [facility]   PHYSICIAN:  Olds Bing, M.D.               DATE OF BIRTH:  1946-08-26   DATE OF ADMISSION:  02/25/2003  DATE OF DISCHARGE:  02/26/2003                                HISTORY & PHYSICAL   REFERRING PHYSICIAN:  Areatha Keas, M.D.   PRIMARY CARDIOLOGIST:  Charlton Haws, M.D.   HISTORY OF PRESENT ILLNESS:  A 65 year old woman with history of paroxysmal  atrial fibrillation referred by Dr. Meriden Callas to the emergency department for  management of a recurrent episode.  The patient has been seen in medical  offices on three occasions in the past week.  She first presented with  increasing pedal edema.  This was attributed to treatment with diltiazem.  Her dose was decreased from a total of 480 mg a day to 240 mg a day and a  diuretic was added to her medical regime.  She returned for reassessment  approximately a week later and reported continuing malaise as well as  dizziness and weakness.  Blood pressure was found to be in the 96 systolic  prompting complete cessation of diltiazem therapy.  She has developed cough,  wheezing, and low-grade fever over the past few days and went to see Dr.  [place] Callas.  She noted palpitations as well.  An EKG apparently revealed  tachycardia prompting referral to the emergency department, where her  initial EKG demonstrated SVT at a rate slightly in excess of 150.  This  subsequently deteriorated to atrial fibrillation and then converted back to  sinus tachycardia.  At the present time she continues to have malaise and  cough but notes no palpitations and no other cardiac symptoms.  She did not  have chest discomfort nor significant dyspnea.   At coronary angiography in December 2003 two-vessel disease was found with  an 80% circumflex lesion and a  50% LAD.  No intervention was advised at that  time.   CURRENT MEDICATIONS:  1. Arthrotec 75 mg b.i.d.  2. Atacand 16 mg daily.  3. Aspirin 325 mg daily.  4. Duragesic 0.1 mg/hour changed q.48h.  5. Atorvastatin 10 mg daily.  6. Metoprolol 50 mg b.i.d.  7. Maxzide 37.5/25 mg daily.  8. Nexium 40 mg daily.  9. Neurontin 600 mg q.h.s.  10.      Pulmicort MDI.  11.      Singulair 10 mg daily.  12.      Zyrtec one q.h.s.  13.      Nasacort b.i.d.  14.      Astelin nasal spray b.i.d.   PAST MEDICAL HISTORY:  Significant for diagnosis of anticardiolipin antibody  syndrome.  She describes some sort of clotting in the veins or arteries of  her retina, perhaps consistent with this diagnosis.  She reports that she  was initially referred for evaluation because of nonunion of cervical spine  fusion, which apparently is associated with this disorder.  She was started  on Coumadin which resulted in healing of her cervical spine.  She also has  history of asthma but has not been troubled with this much since starting  Singulair.  She has had GERD.  She has had substantial DJD with cervical  spine surgery and lumbar spine surgery.  She also apparently has had knee  surgery.   SOCIAL HISTORY:  Lives in [place] with her husband.  Retired.  Does not  use tobacco products nor alcohol.   FAMILY HISTORY:  Positive for congestive heart failure in her mother at an  advanced age, myocardial infarction in her father at age 47, and CABG  surgery in one of her siblings.   REVIEW OF SYSTEMS:  Recent fevers with temperature of 101.8 recorded today.  Recent nasal congestion with yellow discharge.  Chronic urinary frequency.  All other systems negative.   PHYSICAL EXAMINATION:  GENERAL:  A pleasant woman appearing relatively well  but with frequent loose cough.  VITAL SIGNS:  Temperature 97, heart rate 124 and regular, respirations 20,  blood pressure 128/68, O2 saturation 98% on room air.  HEENT:   Anicteric sclerae.  NECK:  No jugular venous distention; no carotid bruits; left neck surgical  scar.  ENDOCRINE:  No thyromegaly.  HEMATOPOIETIC:  No adenopathy.  SKIN:  No significant lesions.  LUNGS:  Few right basilar rales; no significant wheezing or prolonged  expiratory phase.  CARDIAC:  Rapid rhythm; modest systolic ejection murmur at the base.  ABDOMEN:  Soft and nontender; no organomegaly; no bruits.  EXTREMITIES:  Show 1-2+ ankle edema; distal pulses intact.  NEUROMUSCULAR:  Symmetric strength and tone.   LABORATORY DATA:  Chest x-ray:  NAD.   EKG:  Initial tracing and tracing #2 as per HPI.  A third tracing now shows  sinus tachycardia with PACs.  There are nonspecific ST segment  abnormalities.   IMPRESSION:  The patient has paroxysmal atrial fibrillation.  At least with  this episode, the initial arrhythmia appears to be SVT which degenerates to  atrial fibrillation.  It may be that discontinuation of her diltiazem  allowed her SVT to emerge causing recurrent problems.  Since she is not  tolerating diltiazem consideration of radiofrequency ablation is in order.  We will keep her overnight for observation for recurrence of her arrhythmia.  For now, treatment will only be with beta blocker.  Verapamil can be added  if she has additional SVT.  Her risk of thromboembolism is low by classic  considerations - she has not had congestive heart failure, left ventricular  dysfunction, diabetes, hypertension, or previous thromboembolism.  There is  concern regarding her history of anticardiolipin antibody syndrome, but the  likelihood of this contributing to thromboembolism related to atrial  fibrillation cannot be determined.  We will defer treatment with  anticoagulants for the present.  Azithromycin will be started for presumed  bronchitis and possible sinusitis.  Otherwise, her usual medications will be  continued.                                               Bing, M.D.   RR/MEDQ  D:  02/25/2003  T:  02/27/2003  Job:  [id number]   cc:   Sidney Ace, M.D. [facility]

## 2011-03-09 NOTE — H&P (Signed)
[place]  Patient:    Latoya [name]                        MRN: [id number] Adm. Date:  [id number] Attending:  Josie Saunders                         History and Physical  REASON FOR ADMISSION:  Pseudoarthrosis of C6-7 with prior anterior cervical diskectomy and fusion at C5-6 and C6-7 level with degenerative disk disease, cervical spondylosis, and cervical radiculopathy.  HISTORY OF PRESENT ILLNESS:  The patient is a 65 year old woman who has undergone multiple surgeries for severe arthritis, including bilateral knee replacements,  lumbar fusion, and two anterior neck surgeries. She has evidence of lupus and is felt to not heal bone fusions well. She did well following her last neck surgery until August or September of 2000. She complained of pain radiating down into both of her hands. An MRI did not adequately answer the issue of whether she had a complete fusion, and she subsequently underwent a cervical myelogram. I reviewed this with her on September 20, 1999; and this showed spondylosis at multiple levels in her neck with ______ arthropathy at C4-5 level to C5-6 level that appears to be solidly fused. However, there did appear to be some lucency around the screws at the C7 level suggestive of nonunion at that level. She has foraminal stenosis, ith ______ hypertrophy, left greater than right at C6-7. We discussed a variety of treatment options at that point, and I told her she could undergo further injections in her neck, including selective nerve root blocks; but she said these made her feel worse rather than better. She wanted to go ahead with surgical exploration. It was elected to take her to surgery to assess whether there is adequate fusion in her neck and, if not, to redo the C6-7 level. She was felt to also have some foraminal stenosis at C4-5 and that this might require surgical intervention as well. The patient returned  to see me on December 20 and continued to have significant pain. The plan was to go ahead with surgery. She is aware of the risks of surgery which include but are not limited to bleeding, infection, damage to nerve and vessels, weakness, paralysis, ______ laryngeal nerve with resulting hoarseness of voice, damage to the esophagus, damage to the trachea, damage to great vessels, stroke, pseudoarthrosis, and pain and/or weakness.  ALLERGIES:  BETADINE, ______ , VINDESINE. She is LACTOSE INTOLERANT.  CURRENT MEDICATIONS:  Claritin, Arthrotec, Premarin, Flonase, Prevacid, Percocet for pain, calcium supplement.  PAST MEDICAL HISTORY: 1. She has a history of cleft lip and palate. 2. Mitral valve prolapse. 3. Multiple bone and joint surgeries.  PHYSICAL EXAMINATION:  NEUROLOGICAL:  On her initial examination, she had some slight weakness of the eft deltoid and full strength DD:  11/09/99 TD:  11/09/99 Job: [id number]

## 2011-03-09 NOTE — Procedures (Signed)
[facility]  Patient:    Latoya [name], Latoya [name] Visit Number: [id number] MRN: [id number]          Service Type: END Location: ENDO Attending Physician:  Sabino Gasser Dictated by:   Sabino Gasser, M.D. Proc. Date: 05/06/02 Admit Date:  05/06/2002 Discharge Date: 05/06/2002                             Procedure Report  PROCEDURE:  Upper endoscopy.  INDICATION FOR PROCEDURE:  Abdominal pain.  ANESTHESIA:  Demerol 60, Versed 7 mg.  DESCRIPTION OF PROCEDURE:  With the patient mildly sedated in the left lateral decubitus position, the Olympus videoscopic endoscope was inserted in the mouth and passed under direct vision through the esophagus which appeared normal. It was photographed. We entered into the stomach. The fundus, body, antrum, duodenal bulb, and second portion of the duodenum all appeared normal and were photographed. From this point, the endoscope was slowly withdrawn taking circumferential views of the entire duodenal mucosa until the endoscope was then pulled back into the stomach, placed in retroflexion to view the stomach from below. The endoscope was then straightened and withdrawn taking circumferential views of the remaining gastric and esophageal mucosa. The patients vital signs and pulse oximeter remained stable. The patient tolerated the procedure well without apparent complications./  FINDINGS:  Negative examination.  PLAN:  CT scan of the abdomen. Dictated by:   Sabino Gasser, M.D. Attending Physician:  Sabino Gasser DD:  05/06/02 TD:  05/09/02 Job: [id number]

## 2011-03-09 NOTE — Discharge Summary (Signed)
NAME:  Latoya [name], Latoya [name]                          ACCOUNT NO.:  [id number]   MEDICAL RECORD NO.:  [id number]                   PATIENT TYPE:  INP   LOCATION:  3018                                 FACILITY:  [facility]   PHYSICIAN:  Danae Orleans. [name], M.D.               DATE OF BIRTH:  04-28-1946   DATE OF ADMISSION:  09/29/2002  DATE OF DISCHARGE:  10/06/2002                                 DISCHARGE SUMMARY   REASON FOR ADMISSION:  Lumbar disk herniation.   ADDITIONAL DIAGNOSES:  1. Anticardiolipin antibody syndrome.  2. Atrial fibrillation.  3. Congestive heart failure.  4. Lumbosacral spondylosis.  5. Lumbar disk herniation.  6. Coronary atherosclerosis.  7. Asthma without status asthmaticus.  8. Anemia.  9. Status post arthrodesis.   HISTORY OF PRESENT ILLNESS:  The patient is a 65 year old woman who has  undergone multiple prior lumbar and cervical spinal procedures.  She has had  difficulty with healing in the past.  She was found to have an  antiphosolipid antibody syndrome for which she has been treated with  Coumadin.  She has had questionable coronary artery disease and had an  equivocal Cardiolite study which necessitated preoperative clearance with  cardiac catheterization.  The patient has had considerable protracted left  lower extremity pain secondary to a large lateral disk herniation at the L2-  3 level associated with spondylosis, radiculopathy, and spondylolisthesis.  After obtaining cardiac clearance and preoperative evaluation from the  critical care medicine service of [facility], she was admitted on a  same day of procedure basis and underwent exploration of prior fusion at the  L3-5 levels with L2-3 diskectomy with transverse lumbar interbody fusion of  L2-3 with pedicle fixation and posterolateral arthrodesis.   HOSPITAL COURSE:  The patient was observed in the ACU.  She was treated with  intravenous analgesia of morphine PCA.  She was evaluated by  the pulmonary  critical care service and was found to develop afebrile for which she was  seen by the cardiology service.  She was felt to have a low hematocrit and  was given a transfusion of blood for a hemoglobin of 8.3 with new onset  atrial fibrillation.  The patient had improvement in energy level.  She also  was on a Cardizem drip and had resolution of atrial fibrillation.  She was  doing better on October 03, 2002, although did have variable rate going  from 100 to 150 with return of atrial fibrillation.  She was felt to be  stable on October 04, 2002, by the cardiology service.  She was given  aspirin daily and Lopressor 25 mg b.i.d. with 240 mg of Cardizem CD daily.   DISPOSITION:  She was doing well on October 06, 2002, and was discharged  home at that point.   DISCHARGE MEDICATIONS:  1. Cardizem CD 240 mg twice daily.  2. Lopressor 25 mg twice  a day.  3. Enteric-coated aspirin daily.  4. MSIR as needed for pain.  5. Duragesic patch 100 mcg/hr q.72h.  6. Regular medications, although no Zebeta, which was discontinued by Charlton Haws, M.D., of cardiology.   ACTIVITY:  She was instructed not to engage in any lifting, bending,  twisting, or driving.  Wear a brace when up.   WOUND CARE:  Okay to shower.   FOLLOW-UP:  Follow up with Danae Orleans. [name], M.D., with anterolateral lumbar  spine.  Follow up with the [facility] cardiologists as well.                                               Danae Orleans. [name], M.D.    JDS/MEDQ  D:  11/04/2002  T:  11/05/2002  Job:  [id number]

## 2011-03-09 NOTE — H&P (Signed)
[facility]  Patient:    KEYARAH, MCROY                     MRN: [id number] Adm. Date:  [id number] Attending:  Thyra Breed CC:         Bo Mcclintock, M.D.  Jaclyn Prime. Lucas Mallow, M.D.  Cristi Loron, M.D.   History and Physical  HISTORY OF PRESENT ILLNESS:  Yuritzi comes in for followup evaluation today. Since her last evaluation, she has had increased pain.  She rates her pain at 8-10/10 today.  She notes that she is having pain emanating from the lumbosacral region radiating out into the legs.  She describes it as an achy, burning discomfort at the lumbosacral region and as it radiates out it is more of a burning pain.  It is made worse by sitting for prolonged periods of time and improved by changing to standing or laying down.  She has been adjusted with Coumadin by Dr. Lucas Mallow since speaking with Dr. Anabel Bene at our last visit. She is on Duragesic 75 mcg every two days with Neurontin and Arthrotec at the present time.  She has tried the vinegar with honey but it has not been helpful.  She continues with her glucosamine and chondroitin sulfate.  She is somewhat discouraged today overall.  MEDICATIONS:  Her other medications are unchanged from previously.  PHYSICAL EXAMINATION:  VITAL SIGNS:  Blood pressure 160/84, heart rate 83, respiratory rate 18.  O2 saturation 100%.  Pain level is 10.  NEUROLOGIC:  Straight leg raise signs are positive at about 40 degrees bilaterally.  Deep tendon reflexes were symmetric at the knees and ankles and hypoactive.  She is able to ambulate without difficulty.  IMPRESSION: 1. Low-back pain on the basis of degenerative changes at L5-S1, which is    increasingly symptomatic, for which she was told she may need effusion by    Dr. [name] in the near future. 2. Neck pain, status post fusion, stable on the current dose of Duragesic. 3. Other medical problems per other physicians.  DISPOSITION: 1. Increase Duragesic to  100 mcg every two days #15. 2. Continue other medications. 3. Follow up with me in four weeks. 4. She plans to see Dr. Lovell Sheehan in the very near future, who will be replacing    Dr. [name]. DD:  05/05/01 TD:  05/05/01 Job: [id number]

## 2011-03-09 NOTE — Discharge Summary (Signed)
Latoya [name], Latoya [name]                ACCOUNT NO.:  [id number]   MEDICAL RECORD NO.:  [id number]           PATIENT TYPE:   LOCATION:  3743                         FACILITY:  [facility]   PHYSICIAN:  Olga Millers, M.D. [facility]    DATE OF BIRTH:   DATE OF ADMISSION:  10/21/2004  DATE OF DISCHARGE:  10/22/2004                           DISCHARGE SUMMARY - REFERRING   DISCHARGE DIAGNOSES:  1.  Paroxysmal atrial fibrillation.  2.  Anticardiolipin abnormal.  3.  History of peptic ulcer disease.  4.  Gastroesophageal reflux disease.  5.  Multiple surgeries.   HISTORY OF PRESENT ILLNESS:  Latoya [name] is a 65 year old patient of Dr.  [name] with a history of atrial fibrillation, CAD with an anticardiolipin  antibody, peptic ulcer disease as well as GERD. Her last catheterization in  2003 revealed nonobstructive disease and she was treated medically. A  Cardiolite on October 12, 2004 showed an EF of 57% with no ischemia or  infarct. She is followed at [facility] for her anticardiolipin  antibody. She has been treated with Coumadin in the past but not in the past  few years especially with her history of GI bleed.   On the day prior to admission, she had a severe episode of epigastric pain  and began having palpitations similar to her symptoms with atrial  fibrillation. When she had the palpitations, she did not have associated  chest pain but she did feel weak and presyncopal. She came to the emergency  room for further evaluation.   HOSPITAL COURSE:  She was admitted and watched overnight. There were no  further atrial fibrillation. Her cardiac enzymes were negative and she had a  recent negative Cardiolite. Dr. Jens Som felt that if atrial fibrillation  becomes more frequent, we need to add a further antiarrhythmic drug. At this  point, we have increased her Lopressor from 75 mg b.i.d. to 100 mg b.i.d.  and we have discontinued her Atacand. We would need to check an echo as an  outpatient  and at this point, her TSH is pending and we will need to review  this in the office.   DISCHARGE MEDICATIONS:  She will continue her home medications which is an  extensive list as outlined in her history and physical and the only change  is that we are stopping her Atacand and increasing her Lopressor to 100 mg  b.i.d. She may utilize Tylenol as needed.   ACTIVITY:  As tolerated.   DISCHARGE DIET:  Remain on a low-fat diet.   DISCHARGE INSTRUCTIONS:  Call for questions or concerns .   FOLLOWUP:  Dr. Fabio Bering office will call the patient with followup including  followup appointment and a 2-D echo appointment.       LB/MEDQ  D:  10/22/2004  T:  10/22/2004  Job:  [id number]

## 2011-03-09 NOTE — Discharge Summary (Signed)
Latoya [name], Latoya [name]                ACCOUNT NO.:  [id number]   MEDICAL RECORD NO.:  [id number]          PATIENT TYPE:  INP   LOCATION:  6734                         FACILITY:  [facility]   PHYSICIAN:  Hillery Aldo, M.D.   DATE OF BIRTH:  [birth year]   DATE OF ADMISSION:  11/21/2005  DATE OF DISCHARGE:                                 DISCHARGE SUMMARY   PRIMARY CARE PHYSICIAN:  Latoya [name], M.D.   DISCHARGE DIAGNOSES:  1.  Gastroenteritis, with persistent nausea, vomiting and diarrhea.  2.  Chronic back pain.  3.  History of hiatal hernia with erosions.  4.  Chronic narcotic use secondary to chronic back pain.  5.  History of paroxysmal atrial fibrillation.  6.  Gastroesophageal reflux disease.   DISCHARGE MEDICATIONS:  To be dictated at time of actual discharge.   CONSULTATIONS:  Dr. Virginia Rochester of gastroenterology.   BRIEF ADMISSION HISTORY OF PRESENT ILLNESS:  The patient is a 65 year old  female who was previously admitted with what was thought to be a viral  gastroenteritis. She improved symptomatically with conservative therapy over  24 hours and was discharged.  She returned later that day for recurrence of  nausea, vomiting, and diarrhea, and was readmitted. Gastroenterology  consultation was obtained for further evaluation and workup.   PROCEDURES AND DIAGNOSTIC STUDIES:  KUB on November 22, 2005 showed no acute  abdominal abnormalities. Note, the patient did have a CT scan of the abdomen  and pelvis along with a right upper quadrant ultrasound on her previous  hospitalization which showed a hepatic hemangioma and no other  abnormalities.  She has two medical record numbers, so these studies may not  appear under the medical record number on this discharge summary.   DISCHARGE LABORATORY VALUES:  To be dictated time of actual discharge.   HOSPITAL COURSE:  1.  Recurrent nausea, vomiting and diarrhea.  The patient was admitted and      administered IV fluids and antiemetics p.r.n.  Stool studies were      obtained, including Clostridium difficile toxin studies which were      negative x2. Additionally, stool was guaiac negative x2. Radiography was      unrevealing. She was seen in consultation by Dr. Virginia Rochester, who recommended      discontinuation of her Reglan, increase of the proton pump inhibitor to      b.i.d. dosing, and further monitoring. It was also felt that some of her      nausea previous related to chronic narcotic use. It was felt that her      diarrhea was not a secretory diarrhea, given the fact that it would stop      if she decreased her p.o. intake. There was some question raised as to      whether the patient had a porcelain gallbladder per the patient's      report. Other interventions, included stopping her iron and repleting      her electrolytes as needed. At the time of this dictation, she continues      to have intermittent symptoms with epigastric  pain. Gastroenterology      continues to follow her. We are adding Carafate and Mylanta p.r.n. to      her regimen and will continue with further observation and diagnostic      workup.  2.  Chronic back pain.  The patient's chronic back pain was managed with her      home regimen of Dilaudid p.o.  3.  Paroxysmal atrial fibrillation.  No evidence of any atrial fibrillation      during the course of hospitalization thus far.  4.  Hypertension: The patient does have some underlying hypertension, and      her blood pressures trended up through the course of the      hospitalization. She was continued on her home dose of metoprolol, but      will likely need further uptitration of this medication or the addition      of a second medication for optimal blood pressure control.  5.  Hypokalemia: The patient's hypokalemia was thought to be secondary to GI      losses. She was appropriately replaced.   DISPOSITION:  The patient will likely go home when her workup is completed  in her symptoms have  resolved.           ______________________________  Hillery Aldo, M.D.     CR/MEDQ  D:  11/25/2005  T:  11/25/2005  Job:  [id number]   cc:   Latoya [name], M.D.  Fax: [phone]   Georgiana Spinner, M.D.  Fax: [phone]

## 2011-03-09 NOTE — Op Note (Signed)
[facility]  Patient:    Latoya [name]                        MRN: [id number] Proc. Date: 11/09/99 Adm. Date:  [id number] Attending:  Josie Saunders                           Operative Report  PREOPERATIVE DIAGNOSIS:  Pseudoarthrosis, C6-7 with cervical spondylosis, degenerative disk disease and cervical radiculopathy.  POSTOPERATIVE DIAGNOSIS:  Pseudoarthrosis, C6-7 with cervical spondylosis, degenerative disk disease and cervical radiculopathy.  OPERATION PERFORMED:  Anterior cervical corpectomy C7 with bilateral foraminotomies of C6-7 with patellar allograft, strut graft C6 to T1 with anterior cervical plate C6 to T1.  SURGEON:  Danae Orleans. [name], M.D.  ASSISTANT:  Alanson Aly. Roxan Hockey, M.D.  ANESTHESIA:  General endotracheal anesthesia.  ESTIMATED BLOOD LOSS:  200 cc.  COMPLICATIONS:  None.  DISPOSITION:  To recovery.  INDICATIONS FOR PROCEDURE:  Latoya [name] is a 65 year old woman with severe bilateral upper extremity pain and weakness.  She had undergone prior anterior cervical diskectomy and fusion at C5-6 and C7 levels and had evidence on postoperative imaging of pseudoarthrosis at C6-7 with intact fusion at C5-6 level. It was elected after she did not respond to conservative management to explore er neck with possible redo of the fusion at the C6-7 level.  DESCRIPTION OF PROCEDURE:  [name] was brought to the operating room. Following the satisfactory and uncomplicated induction of general endotracheal anesthesia and placement of intravenous lines and a Foley catheter, she was placed in a supine  position.  Her left hip was placed on a blanket roll for possible hip bone grafting.  Her neck was placed in slight extension and she was placed on a horseshoe head holder in 10 pounds of halter traction.  Her neck was prepped with Hibiclens and alcohol.  The area of planned incision was infiltrated with 0.25%  Marcaine  and 0.5% lidocaine with 1:200,000 epinephrine.  Her previous incision as reopened and scar was excised.  Blunt dissection was performed to expose the anteriuor cervical spine keeping the carotid sheath lateral  and trachea and esophagus medial.  The anterior cervical spine and prior Landis plate were identified and dissected free of scar tissue.  The screws at C5, C6 and C7 were all removed and plate was removed.  The C5-6 level appeared to be solidly fused. The C6-7 level did not appear to be solidly fused.  The inferior edge of the bone graft was well incorporated but the superior edge had a lucent line.  The bone graft as then drilled out.  The inferiormost screws at C7 were abutting the C7-T1 disk space and it was felt that there was no insufficient remaining good quality bone in the C7 level for adequate purchase of screws or for good bone grafting.  It was consequently elected to perform a corpectomy of C7 and using further exposure, he C7-T1 level was incised with a 15 blade and disk material was removed in a piecemeal fashion.  The C7 vertebral body was then removed.  The microscope was  brought into the field.  Self-retaining Shadowline retractor was used.  A corpectomy of C7 was completed.  The C7 nerve roots were found to be encased in  scar tissue and were carefully decompressed using microscopic visualization and  Midas Rex drill with A-2 bur.  The uncinate spurs of C7  were removed and both the C7 nerve roots were decompressed.  The C8 nerve root was decompressed on the right as well.  Both the uncinate spurs were felt to be adequately removed and to the  pedicles of C7.  The C6 and T1 end plates were then decorticated with a Midas Rex drill and A-2 bur and a patellar allograft bone graft was then cut to a width of 1 cm, a depth of 15 mm and a length of 25 mm and then this was inserted into the corpectomy defect while maintaining significant traction on the  patients neck and countersunk appropriately.  Subsequently, a 47.5 mm Atlantis anterior cervical plate was affixed to the anterior cervical spine.  The C6 screw holes were used  with variable angle screws and at T1 13 x 4 fixed angle screws were placed. All screws had excellent purchase.  Locking mechanisms were engaged.  There was excellent hemostasis.  Soft tissues appeared to be in good repair.  The platysma layer was reapproximated with 3-0 Vicryl interrupted inverted sutures.  Skin edges reapproximated with interrupted 3-0 Vicryl subcuticular stitch.  The wound was dressed with benzoin and Steri-Strips.  Telfa gauze and tape.  The patient was extubated in the operating room and taken to the recovery room in stable and satisfactory condition having tolerated the procedure well. DD:  11/09/99 TD:  11/10/99 Job: [id number]

## 2011-03-09 NOTE — H&P (Signed)
[facility]  Patient:    Latoya [name], Latoya [name]                       MRN: [id number] Adm. Date:  [id number] Attending:  Thyra Breed CC:         Bo Mcclintock, M.D., [facility]  Jaclyn Prime. Lucas Mallow, M.D.  Cristi Loron, M.D.   History and Physical  HISTORY OF PRESENT ILLNESS:  Latoya [name] comes in for follow-up evaluation today, doing remarkably better.  Her pain level was down to 2/10.  She is now on 100 mcg every two days of Duragesic.  She does note that prolonged sitting, standing, or lifting will increase her leg and back discomfort, but in general she is doing well.  CURRENT MEDICATIONS:  1. Advair 100/50 mg twice a day.  2. Arthrotec 75 mg twice a day.  3. Atacand 16 mg per day.  4. Cenestin.  5. Coumadin.  6. Duragesic 100 mcg every two days.  7. Lipitor.  8. Nasacort.  9. Nexium. 10. Neurontin 900 mg per day. 11. Singulair. 12. Zebeta. 13. Zyrtec.  PHYSICAL EXAMINATION:  Blood pressure 151/92, heart rate 83, respiratory rate 18, O2 saturation 99%.  The pain level was 2/10.  Straight leg raise signs were negative today.  Deep tendon reflexes were symmetric at the knees and ankles.  She is ambulating a bit better than she has in the past.  In general she feels much improved, but is concerned about when she can come down off of the opiates.  I advised her that she has a chronic problem resulting in her pain and that it is likely that she would be on these for long period of time.  We will go ahead and continue on these for the time being.  IMPRESSION: 1. Low back pain on the basis of lumbar spondylosis. 2. Neck pain on the basis of cervical spondylosis, status post fusion. 3. Other medical problems per primary care physician.  DISPOSITION: 1. Continue on Duragesic 100 mcg every two days, #15. 2. Continue other medications. 3. Follow up with me in four to eight weeks. DD:  06/02/01 TD:  06/02/01 Job: [id number]

## 2011-03-09 NOTE — H&P (Signed)
[facility]  Patient:    Latoya [name], Latoya [name]                     MRN: [id number] Adm. Date:  [id number] Attending:  Thyra Breed CC:         Danae Orleans. [name], M.D.  Helene Kelp, M.D.  Dr. Bo Mcclintock   History and Physical  NEW PATIENT EVALUATION  HISTORY OF PRESENT ILLNESS:  Latoya [name] is a 65 year old patient sent by Dr. Maeola Harman for evaluation and management of chronic pain.  The patient describes multiple types of pain.  Her biggest problem is her neck.  She states she has had three surgical interventions, including two fusions with problems with the fusions.  She describes her pain as a stiffness and soreness, which is most prominent in the early evening and towards the end of the day.  It will loosen up as the day goes on.  It is made worse by weather changes.  Typically it is more of any achy-type discomfort and stiff, but will spread out from the neck region out into the interscapular region.  It is associated with numbness and tingling of the first, second, and third fingers of the left upper extremity and weakness of the left upper extremity.  At times she feels like there is a tourniquet applied to her upper arm.  She denies any bowel or bladder incontinence.  Her most recent surgical intervention has been in January 2001, and she does not recall whether any radiographic follow-ups have shown whether she has any pseudoarthrosis.  She was been treated with OxyContin 40 mg 2 x q.d. and hydrocodone which she typically takes three tablets per day.  She states that this incompletely controls her pain.  She has had problems with low back discomfort which became most evident about a year ago after a long car trip.  She has had Ray cage fusions of the lower back and actually describes this as minimal problems.  She has had recurrent problems with anticardiolipin antibody syndrome, which is felt to be one of the reasons why her fusions have  been poor to take.  She has had multiple miscarriages and Raynauds.  She has been treated by Dr. Bo Mcclintock at [facility] for this.  It was originally diagnosed by Dr. Phylliss Bob.  She has multiple other medical problems which include asthma for which she sees Dr. [place] Callas, an idiopathic cardiomyopathy for which she sees her primary care physician, positional vertigo for which she sees an ENT physician, Dr. Dorma Russell, hyperlipidemia, hiatal hernia with gastroesophageal reflux disease for which she sees her primary care physician, diverticulosis, and allergy to dust mites.  She also notes following her surgeries on her neck she has had some swallowing dysfunctions.  Her previous treatments have included nonsteroidal anti-inflammatory agents which exacerbate her gastroesophageal reflux disease.  She also has been treated with muscle relaxants which are equivocal in how well they control her discomfort.  CURRENT MEDICATIONS:  1. Arthrotec 75 mg b.i.d.  2. Advair.  3. Atacand.  4. Cenestin.  5. Zyrtec.  6. Lipitor.  7. Nasacort.  8. Nexium.  9. Neurontin 300 mg t.i.d. which makes her sleepy. 10. Zebeta. 11. OxyContin 40 mg 2 x q.d. 12. Hydrocodone p.r.n. 13. Antivert p.r.n. 14. Carafate p.r.n. 15. Maxair p.r.n. 16. She has dietary supplements which include fish oil, glucosamine, Colace,     and calcium.  ALLERGIES:  The patient is allergic to CONTRAST DYES,  HEPARIN, BETADINE, HIBICLENS, INDOCIN, and PHENYLPROPANOLAMINE.  FAMILY HISTORY:  Positive for diabetes, colon cancer, thyroid cancer, coronary artery disease, hypothyroidism, peptic ulcer disease, and osteoarthritis.  ACTIVE MEDICAL PROBLEMS:  Underlying idiopathic cardiomyopathy, asthma, hyperlipidemia, peptic ulcer disease/hiatal hernia, osteoarthritis, positional vertigo.  SOCIAL HISTORY:  The patient is a nonsmoker and nondrinker.  She exercises regularly.  PAST SURGICAL HISTORY:  Cleft palate repair at  age 83 at [facility], tonsillectomy at age 55, scalenectomy at age 29, partial hysterectomy at age 64, laminectomy by Dr. Darrelyn Hillock in 1994, Ray cage procedure in the fall of 1998 by Dr. [name], fusion of lumbar spine with pedicle screws in January 2000, fusions at [facility] in 1998, 1999, and 2001.  REVIEW OF SYSTEMS:  GENERAL:  Negative.  HEAD:  Significant for occipital headaches and history of migraines.  EARS:  Positive for vertigo.  EYES: Negative.  NOSE/MOUTH/THROAT:  Significant for swallowing dysfunction. PULMONARY:  Significant for asthma.  CARDIOVASCULAR:  Significant for cardiomyopathy with shortness of breath on exertion.  GI:  Significant for gastroesophageal reflux disease, diverticulosis.  GU:  Negative. MUSCULOSKELETAL/NEUROLOGIC:  See HPI.  There is a question of her TI in the past, but no seizures.  CUTANEOUS:  Significant for Raynauds for the past six to seven years which may be related to her anticardiolipin antibody syndrome. HEMATOLOGIC:  Significant for anticardiolipin antibody syndrome.  ENDOCRINE: Negative.  PSYCHIATRIC:  Positive for chronic anxiety.  ALLERGY/IMMUNOLOGIC: Positive for dust allergies.  PHYSICAL EXAMINATION:  VITAL SIGNS:  Blood pressure 153//68, heart rate 99, respiratory rate 18, O2 saturation 100%.  Pain level 4/10.  GENERAL:  This is a pleasant female who has a cushingoid appearance to her face.  HEAD:  Normocephalic, atraumatic.  EYES:  Extraocular movements intact with conjunctivae and sclerae clear.  NOSE:  Patent nares.  OROPHARYNX:  Free of lesions.  NECK:  Evidence of previous surgery over the anterior aspect of her neck with carotids 2+ and symmetric without bruits.  LUNGS:  Clear.  HEART:  Regular rate and rhythm.  BREASTS/ABDOMEN/PELVIC/RECTAL:  Not performed.   BACK:  Exam revealed mild scoliosis through the thoracic spine with no tenderness over the SI joints.  She had a well-healed surgical scar over  the lumbar region.  Straight leg raise signs were negative.  EXTREMITIES:  The patient demonstrated minimal evidence of bony enlargement of the PIPs, DIPs, and first carpometacarpal joints of the hands as well as the first MTPs of the feet.  Dorsalis pedis pulses and radial pulses were 2+ and symmetric.  She has bluish discoloration over the soles of her feet.  NEUROLOGIC:  The patient is oriented x 4.  Cranial nerves II-XII were grossly intact.  Deep tendon reflexes were symmetric in the upper extremities and lower extremities.  Sensory was intact to pinprick, vibratory sense, and light touch, except over the first, second, and third fingers of the left upper extremity where there was attenuated perception.  Motor was significant for symmetric bulk and tone.  Coordination was grossly intact.  IMPRESSION:  1. Chronic neck pain status post fusions with question of pseudoarthrosis.     She is continuing to see Dr. [name] with regard to this.  She is currently     on opiates.  2. Low back pain syndrome status post fusion.  3. Multiple other medical problems per other physicians which anticardiolipin     antibody syndrome, idiopathic cardiomyopathy, asthma, allergic rhinitis,     hyperlipidemia, gastroesophageal reflux disease/peptic ulcer disease,  osteoarthritis predominantly of the axial skeleton, positional vertigo,     history of migraines, questionable history of transient ischemic     attack, and swallowing dysfunction which she attributes to her surgical     interventions.  She also has a chronic anxiety syndrome.  DISPOSITION:  1. The patient is extremely well adapted to her current pain syndrome and     seems quite appropriate in her responses.  I advised her that I would like     to switch her from OxyContin to Duragesic so that we get a more even     distribution of her opiates through the course of the day and would     recommend going on 75 mcg 1 q.3 days.  She will let  us know in 10 days if     she does not respond over a 72 hour period or if it is a shorter period.     I gave her a prescription for 10.  We reviewed the potential side effects     in details.  She will go ahead and sign an opiates contact.  2. Continue on other medications with exception of hydrocodone and the     OxyContin which she is to replace.  3. We discussed the possibility of having her seen by a psychologist which     sheis very open to to help with pain coping skills.  4. Followup with me in four weeks. DD:  03/06/01 TD:  03/06/01 Job: [id number]

## 2011-03-09 NOTE — Discharge Summary (Signed)
NAME:  Latoya [name], Latoya [name]                          ACCOUNT NO.:  [id number]   MEDICAL RECORD NO.:  [id number]                   PATIENT TYPE:  INP   LOCATION:  4709                                 FACILITY:  [facility]   PHYSICIAN:  Charlton Haws, M.D. [facility]              DATE OF BIRTH:  1946/05/11   DATE OF ADMISSION:  09/22/2002  DATE OF DISCHARGE:  09/25/2002                           DISCHARGE SUMMARY - REFERRING   PROCEDURES:  Coronary angiogram December 2.   REASON FOR ADMISSION:  Please refer to dictated admission note.   LABORATORY DATA:  WBC 9.6, hemoglobin 13, hematocrit 38.6, platelets 295 on  admission.  Sodium 138, potassium 3.0 on admission, 3.4 prior to discharge.  Glucose 86, BUN 12, creatinine 0.6.   HOSPITAL COURSE:  The patient presented for elective coronary angiography  for definitive exclusion of significant underlying coronary artery disease  in light of recently reported abnormal stress Cardiolite.  The patient  recently presented to Dr. [name] in the office for a second opinion prior to  elective repeat lower back surgery.  Of note, the patient is on chronic  Coumadin secondary to history of antiphospholipid antibody.  This was held  four days prior to scheduled procedure.  INR was 0.9 on admission.   Cardiac catheterization was performed by Dr. Shawnie Pons.  (Please see  report for full details.)  This revealed approximately 80% proximal diagonal-  1 and 70 to 80% mid circumflex disease with mild, 40% proximal LAD and  normal RCA.  Left ventriculogram was normal (EF 70%).   Following review of films with colleagues, recommendation was to proceed  with medical therapy, and it was felt that the tightest lesion, the  circumflex, was stable in appearance.   Given these findings and recommended treatment plan, the patient was cleared  to proceed with elective laminectomy early next week.   Postoperative course was notable for development of right femoral artery  pseudoaneurysm which was subsequently successfully compressed.  Followup  venous Doppler revealed continued thrombosis.   The patient was also noted to have hypokalemia on admission (potassium 3.0).  This was treated with repletion prior to discharge with a pre-discharge  level of 3.4.  Of note, the patient was not on any diuretics prior to  admission.   MEDICATION ADJUSTMENTS THIS ADMISSION:  Up titration of Zebeta to 10 mg q.d.  per Dr. [name] for better heart rate control preoperatively.  The patient  will continue holding Coumadin until cleared by Dr. [name].   DISCHARGE MEDICATIONS:  1. Zebeta 10 mg q.d.  2. Atacand 16 mg q.d.  3. Duragesic patch 100 mcg q.o.d.  4. Lipitor 10 mg q.d.  5. Nexium 40 mg b.i.d.  6. Neurontin 600 mg q.d.  7. Singulair 10 mg q.h.s.  8. Glucosamine chondroitin 600 mg b.i.d.  9. Arthrotec 1 tablet p.o. b.i.d.  10.      Astelin nasal spray twice daily.  11.      Nasacort AQ twice daily as directed.   DISCHARGE INSTRUCTIONS:  1. The patient is to refrain from Coumadin until cleared by surgeon.  2. No heavy lifting, strenuous activity, or driving until further notice.  3. The patient is to call the office if there is any swelling or bleeding in     the groin.  4. The patient will return next Tuesday, December 9, for lumbar laminectomy     with Dr. [name].  She will have subsequent followup in the  office with     Dr. [name].   DISCHARGE DIAGNOSES:  1. Coronary artery disease.     a. An 80% proximal diagonal #1 and mid circumflex disease, coronary        angiogram December 2.     b. Medical therapy recommended.     c. Normal left ventricle.  2. Right femoral artery pseudoaneurysm status post successful compression.  3. History of antiphospholipid antibody on chronic Coumadin.  4. Hypokalemia.  5. Chronic lower back pain status post multiple surgeries, awaiting lumbar     laminectomy.  6. Dyslipidemia.  7. Questionable HEPARIN allergy.      Gene Serpe, P.A. [facility]                      Charlton Haws, M.D. [facility]    GS/MEDQ  D:  09/25/2002  T:  09/25/2002  Job:  [id number]   cc:   Dr. Linzie Collin

## 2011-03-09 NOTE — Op Note (Signed)
NAMECALISE, DUNCKEL NO.:  [id number]   MEDICAL RECORD NO.:  [id number]          PATIENT TYPE:  AMB   LOCATION:  ENDO                         FACILITY:  [facility]   PHYSICIAN:  Georgiana Spinner, M.D.    DATE OF BIRTH:  [birth year]   DATE OF PROCEDURE:  10/17/2006  DATE OF DISCHARGE:                               OPERATIVE REPORT   SURGEON:  Georgiana Spinner, M.D.   PROCEDURE:  Colonoscopy.   INDICATIONS:  Colon polyps.   ANESTHESIA:  Fentanyl 100 mcg, Versed 9 mg.   DESCRIPTION OF PROCEDURE:  With the patient mildly sedated in the left  lateral decubitus position, the Pentax videoscopic colonoscope was  inserted into the rectum and passed under direct vision to the cecum,  identified by the ileocecal valve and appendiceal orifice, both of which  were photographed.  From this point, the colonoscope was slowly  withdrawn, taking circumferential views of colonic mucosa, stopping only  in the rectum.  The rectum showed small hemorrhoids on retroflex view.  The endoscope was straightened and withdrawn.  The patient's vital signs  and pulse oximetry remained stable.  The patient tolerated the procedure  well and without apparent complications.   FINDINGS:  Small internal hemorrhoids; diverticulosis of the sigmoid  colon, and scattered through the right colon as well; otherwise, an  unremarkable examination.   PLAN:  Repeat examination in 5 years.           ______________________________  Georgiana Spinner, M.D.     GMO/MEDQ  D:  10/17/2006  T:  10/17/2006  Job:  [id number]

## 2011-03-09 NOTE — Op Note (Signed)
Latoya [name], Latoya [name]                ACCOUNT NO.:  [id number]   MEDICAL RECORD NO.:  [id number]            PATIENT TYPE:   LOCATION:                                 FACILITY:   PHYSICIAN:  Georgiana Spinner, M.D.         DATE OF BIRTH:   DATE OF PROCEDURE:  11/26/2005  DATE OF DISCHARGE:                                 OPERATIVE REPORT   PROCEDURE:  Upper endoscopy.   INDICATIONS:  Vomiting.   ANESTHESIA:  Fentanyl 50 mcg, Versed 5 mg.   PROCEDURE:  With the patient mildly sedated in the left lateral decubitus  position the Olympus videoscopic endoscope was inserted into the mouth and  passed under direct vision through the esophagus which appeared normal,  except for possible yeast in the esophagus. The fundus, body, antrum,  duodenal bulb, second portion of the duodenum were visualized and appeared  normal. From this point the endoscope was slowly withdrawn taking  circumferential views of the duodenal mucosa until the endoscope had been  pulled back into the stomach; placed in retroflexion to view the stomach  from below. The endoscope was straightened and withdrawn taking  circumferential views of the remaining gastric and esophageal mucosa. The  patient's vital signs, and pulse oximeter remained stable. The patient  tolerated procedure well without complications.   FINDINGS:  Question yeast in the esophagus, await brushings, but the patient  can be discharged with outpatient follow-up.           ______________________________  Georgiana Spinner, M.D.     GMO/MEDQ  D:  11/26/2005  T:  11/26/2005  Job:  [id number]

## 2011-03-09 NOTE — Discharge Summary (Signed)
NAMEDANILYNN, JEMISON                ACCOUNT NO.:  [id number]   MEDICAL RECORD NO.:  [id number]          PATIENT TYPE:  INP   LOCATION:  5707                         FACILITY:  [facility]   PHYSICIAN:  Hillery Aldo, M.D.   DATE OF BIRTH:  1946/07/02   DATE OF ADMISSION:  11/18/2005  DATE OF DISCHARGE:                                 DISCHARGE SUMMARY   PRIMARY CARE PHYSICIAN:  Dr. Phylliss Bob.   DISCHARGE DIAGNOSES:  1.  Nausea and vomiting secondary to viral gastroenteritis versus chemical      gastroenteritis, resolved.  2.  Hypokalemia.  3.  Hepatic hemangioma.  4.  Severe osteoarthritis with degenerative disk disease status post      multiple spinal fusions in the past.  5.  History of coronary artery disease.  6.  History of asthma.  7.  History of deep venous thrombosis.   DISCHARGE MEDICATIONS:  1.  Astelin nasal spray b.i.d.  2.  Enteric-coated aspirin 81 milligrams daily.  3.  Duragesic patch 100 mcg q.48 h.  4.  Lipid 20 milligrams daily.  5.  Metoprolol 75 milligrams b.i.d.  6.  Nasacort 1 spray b.i.d.  7.  Neurontin 600 milligrams q.h.s.  8.  Serax 250 milligrams b.i.d.  9.  Pulmicort 2 puffs q.12 h  10. Singulair 10 milligrams q.h.s.  11. Solu tab 30 milligrams q.h.s.  12. Zyrtec D b.i.d.  13. Colace p.r.n.  14. Calcium plus vitamin D 600 milligrams b.i.d.  15. Nexium 40 milligrams b.i.d.   CONSULTATIONS:  None.   BRIEF ADMISSION HISTORY OF PRESENT ILLNESS:  The patient is a 65 year old  female who presented with a 24-hour history of nausea, vomiting and  diarrhea. She reported a similar experience approximately 2 weeks prior to  her admission but it was not as severe and resolved on its own. Denied any  blood in the stool. She also had some right upper quadrant pain. She was  admitted for further evaluation, IV fluids, and workup.   PROCEDURES AND DIAGNOSTIC STUDIES:  1.  Right upper quadrant ultrasound on November 19, 2005, showed 4.7 cm mass      in the central  right lobe of her liver.  The common bile duct was      markedly dilated. There was a questionable biliary obstruction in the      region of the pancreas. The gallbladder was within normal limits.  2.  CT scan of the abdomen and pelvis showed that the lesion in the anterior      segment of the right lobe of the liver was consistent with hemangioma.      No other acute abnormalities. There was no dilatation of the common bile      duct seen on CT scan imaging. The stomach and colon as well as a small      bowel were unremarkable.  There was no free fluid or significant      adenopathy.   Discharge laboratory values: Sodium was 141, potassium 3.2 (repleted prior  to discharge), chloride 107, bicarb 28, BUN 6, creatinine 0.5, glucose 84.  White  blood cell count was 3.5, hemoglobin 10.8, hematocrit 30.8 and  platelets 278.   HOSPITAL COURSE:  1.  Nausea, vomiting, diarrhea secondary to viral gastroenteritis versus      chemical gastroenteritis: The patient was admitted and given IV fluid      rehydration. The liver function studies did not reveal any      abnormalities. Because of the biliary ductal dilatation seen on the      right upper quadrant ultrasound, CT scan was obtained which did not show      any evidence of dilatation. She was treated conservatively with      antiemetics and her iron and Arthrotec were held. She is placed on      proton pump inhibitor therapy. By the next morning, she was feeling well      and able to keep p.o.'s down without any difficulties. Due to the acute      resolution of her symptoms, the lack of elevated white blood cell count,      it was thought that her gastroenteritis was likely viral in nature      versus chemical irritation from her multiple medications. She will be      discharged on twice daily proton pump inhibitor therapy with      instructions to call her primary care doctor for any return of symptoms.  2.  Hepatic mass: The patient did  undergo CT scan evaluation which revealed      a benign hemangioma. Occasionally these can cause obstructive type      symptoms and she was given that information prior to her discharge.  3.  Chronic pain secondary to osteoarthritis/degenerative disk disease: The      patient was continued on her usual home regimen of pain medications.  4.  Hypokalemia: Thought to be secondary to nausea, vomiting, diarrhea. She      was appropriately repleted prior to discharge.   DISPOSITION:  The patient is discharged home in improved condition. She is  encouraged to eat small and more frequent meals and to avoid spicy foods.  She was instructed to follow up with Dr. Phylliss Bob in 1-2 weeks. She is also  advised to hold her iron and her Arthrotec if her stomach hurts or if she  develops any problems with nausea.           ______________________________  Hillery Aldo, M.D.     CR/MEDQ  D:  11/20/2005  T:  11/20/2005  Job:  [id number]   cc:   Areatha Keas, M.D.  Fax: [phone]

## 2011-03-09 NOTE — Discharge Summary (Signed)
NAMEANJOLINA, BYRER                ACCOUNT NO.:  [id number]   MEDICAL RECORD NO.:  [id number]          PATIENT TYPE:  INP   LOCATION:  3006                         FACILITY:  [facility]   PHYSICIAN:  Danae Orleans. [name], M.D.  DATE OF BIRTH:  [birth year]   DATE OF ADMISSION:  02/25/2009  DATE OF DISCHARGE:  03/11/2009                               DISCHARGE SUMMARY   REASON FOR ADMISSION AND HOSPITAL COURSE:  Latoya [name] is a 65 year old  woman who recently had undergone anterior lumbar fusion L5 through S1  and she presented back in the office on Feb 25, 2009, with severe  bilateral lower extremity pain.  She was found to have had migration of  the cage and retropulsion of the device.  It was elected to admit her to  the hospital.  The patient was maintained on bedrest and then was taken  back to surgery on Mar 01, 2009, for revision L5-S1 decompression and  fusion.  The patient did well with this surgery and had good strength in  both lower extremities, was treated for TLSO and was treated with  Arixtra.  She was given Arixtra subcutaneously.  She is allergic to  heparin.  The patient was gradually mobilized and was given Dilaudid for  pain, was placed in her back brace and gradually mobilized and did well.  With that, she had urinary tract infection, was started Pyridium and  ciprofloxacin.  The patient was seen in conjunction with home health  services, was gradually mobilized, was doing better on the Mar 11, 2009.  Discharged home in stable and satisfactory condition.  She tolerated her  operation and hospitalization well.   FOLLOWUP INSTRUCTIONS:  To follow up in the office in 3 weeks with  repeat radiographs of the lumbar spine.  She was discharged home with  Master mouthwash and ciprofloxacin 500 mg twice daily and her home pain  medications.      Danae Orleans. [name], M.D.  Electronically Signed     JDS/MEDQ  D:  03/29/2009  T:  03/29/2009  Job:  [id number]

## 2011-03-09 NOTE — Op Note (Signed)
NAME:  Latoya [name], Latoya [name]                          ACCOUNT NO.:  [id number]   MEDICAL RECORD NO.:  [id number]                   PATIENT TYPE:  INP   LOCATION:  3101                                 FACILITY:  [facility]   PHYSICIAN:  Danae Orleans. [name], M.D.               DATE OF BIRTH:  Sep 21, 1946   DATE OF PROCEDURE:  09/29/2002  DATE OF DISCHARGE:                                 OPERATIVE REPORT   PREOPERATIVE DIAGNOSIS:  Herniated lumbar disc L2-L3 with spondylosis,  spondylolisthesis, lumbar radiculopathy, and degenerative disc disease.   POSTOPERATIVE DIAGNOSIS:  Exploration of prior fusion with L2-L3 discectomy  with transverse lumbar interbody fusion with interspace bone dowel and  morcellized autograft additionally with pedicle screw fixation L2-L5 levels  bilaterally with posterolateral arthrodesis.   SURGEON:  Danae Orleans. [name], M.D.   ASSISTANT:  Stefani Dama, M.D.   ANESTHESIA:  General endotracheal.   ESTIMATED BLOOD LOSS:  500 cc with 200 cc of CellSaver blood returned to the  blood.   COMPLICATIONS:  None.   DISPOSITION:  Recovery.   INDICATIONS FOR PROCEDURE:  The patient is a 65 year old woman with  endocardial antibody syndrome who has had extensive problems with neck and  lumbar degenerative disease and has had multiple surgeries.  She had prior  lumbar fusion, L3-L5 levels.  She now has large disk herniation at L2-L3  level on the left with significant canal stenosis and severe left lower  extremity pain.  We elected to take her to surgery for exploration of prior  fusion, decompression of L2-L3 interspace and interbody fusion at this  level.   PROCEDURE:  The patient is brought to the operating room.  Following  induction of uncomplicated general endotracheal anesthesia and placement of  intravenous line and Foley catheter, she is placed in a prone position on  the operating table.  Her low back was then prepped and draped in the usual  sterile fashion.  The  previous scar was removed after infiltrating the area  ________________with 0.25% Marcaine, 0.5% lidocaine, and 1:200,000  epinephrine.  The incision was carried through the lumbodorsal fascia with  electrocautery.  Subperiosteal dissection was performed.  The posterior  instrumentation from L3-L5 levels was identified and subsequently removed.  This was SDRS pedicle screw fixation.  The ______ caps were removed, the  rods were removed.  Subsequently the fusion was explored and there was found  to be incomplete bone growth with some areas that had fairly good bone  growth and other areas that were somewhat spotty.  It was therefore elected  to keep the hardware from L3-L5 levels, but this had to be exchanged as the  SDRS system was an outdated system.  Consequently new screws were placed  using SD90 screws at L3-L5 levels.  Subsequently pedicle screws were placed  at L2 after decompressing the thecal sac and L2 nerve roots bilaterally.  Total laminectomy of  L2 was performed.  The left side of L2 nerve root was  significantly tented up by very densely adherent and scarred disc material  as was the common dural tube. This was very carefully dissected free of  scarred disc material  using loop magnification with painstaking dissection.  Subsequently the interspace was entered which was very degenerated and this  was gradually opened using a variety of osteotomes.  The right-sided L2  nerve root and common dural tube were also decompressed.  Pedicle screw  fixation was then placed at L2 using 5.75 x 45 mm pedicle screws.  The  screws had excellent purchase, no cutouts were identified.  Final position  of screws was confirmed on AP and lateral fluoroscopy.  Subsequently using  the Synthes interspace spreader, the interspace was distracted on the left  and then locked into position with a short-segment rod on the right-sided  pedicle screws.  The endplates were then stripped of disc material and   ultimately a 7-mm T-lift bone dowel was inserted in the interspace after the  interspace was stripped of disc and cartilage nous material.  This was  positioned to appropriate depth, it was somewhat lateral in its positioning  but it was felt to be well positioned on the left and was very secure in the  interspace.  A morcellized autograft was then placed over the bone dowel and  ______ into position.  Subsequently the distraction was removed and  ultimately 110 mm rods were dosed appropriately and affixed at the screw  heads and locking mechanisms were engaged.  Prior to placing the rods,  morcellized bone autograft was mixed with 15 cc of ____________ and the  drillings from the laminectomy were also mixed with this along with blood  aspirated from the fractions of the pedicles at the time of pedicle screw  placement.  The transverse processes of L2 and the superior aspect of the  fusion mass at L3 were then decorticated and the bone graft was laid over in  this location.  The nerve roots were felt to be well decompressed as was the  common dural tube.  There was no evidence of any CSF leak.  The rods were  then locked into position.  The self-retaining retractor was removed.  The  lumbodorsal fascia was closed with 1 Vicryl sutures.  The subcutaneous  tissue was reapproximated with 2-0 Vicryl interrupted inverted sutures.  Skin was reapproximated with interrupted 3-0 Vicryl subcutaneous stitch.  The wound was dressed with Benzoin and Steri-Strips _____________.  Patient  was extubated in the operating room and taken to recovery in stable and  satisfactory condition and tolerated the operation well.  Counts were  correct with the exception of one small patty which was not accounted for.  Consequently a lateral lumbar radiograph was obtained which did not  demonstrate any retained foreign body.                                              Danae Orleans. [name], M.D.    JDS/MEDQ  D:   09/29/2002  T:  09/30/2002  Job:  [id number]

## 2011-03-09 NOTE — Cardiovascular Report (Signed)
NAME:  Latoya [name], Latoya [name]                          ACCOUNT NO.:  [id number]   MEDICAL RECORD NO.:  [id number]                   PATIENT TYPE:  OIB   LOCATION:  4709                                 FACILITY:  [facility]   PHYSICIAN:  Arturo Morton. Riley Kill, M.D. [facility]         DATE OF BIRTH:  1946/03/16   DATE OF PROCEDURE:  DATE OF DISCHARGE:                              CARDIAC CATHETERIZATION   INDICATIONS FOR PROCEDURE:  The patient is a 65 year old female with several  medical problems.  One has included recurrent and chronic back pain  requiring multiple surgeries.  She had an abnormal Cardiolite scan done  recently and was sent to Dr. [name] for a second opinion.  The myocardial  perfusion scan revealed probable anterior wall scar extending from the base  into the apex with anterior wall reversibility toward the apex.  The EF was  51%.  This is a preoperative situation.  She also has potentially  antiphospholipid antibody as well as a prothrombin gene mutation according  to her history.  The current study is done to assess coronary anatomy.   PROCEDURE:  1. Left heart catheterization.  2. Selective coronary arteriography.  3. Selective left ventriculography.   DESCRIPTION OF PROCEDURE:  The procedure was performed from the right  femoral artery using #6 French catheters.  There was some damping within the  left main.  Because of this, we eventually used a guiding catheter to sit  outside the left main to try to better identify the left main.  The patient  did have damping in the left main with engagement.  She tolerated the  procedure well and there were no complications.  I will eliminate all the  heparin from the saline because of her heparin allergy.   HEMODYNAMIC DATA:  1. Central aortic pressure:  177/87.  2. Left ventricular pressure:  204/13.  3. No gradient on pullback across the aortic valve.   ANGIOGRAPHIC DATA:  1. Ventriculography was performed in the RAO projection.   Overall ejection     fraction was 70%.  No segmental wall motion abnormalities were     identified.  2. The left main coronary artery has, what appears to be, about 30%     narrowing at the ostium.  In most views there does appear to be a patent     ostium; however, some tapered narrowing cannot be excluded.  3. The LAD proper has about 40-50% narrowing, at most, in the mid portion     beyond the diagonal takeoff.  The vessel is calcified.  The diagonal     takeoff itself has a long 80% stenosis at the ostium extending out into     the mid portion of the diagonal.  The distal diagonal does appear to be     suitable for grafting.  4. The large circumflex has about 70-80% focal narrowing prior to the     bifurcation of the  marginal.  5. The right coronary artery has some mild luminal irregularities but no     high-grade stenoses.   IMPRESSION:  1. Coronary artery disease with significant involvement and circumflex     involvement.  2. Multiple medical issues, as described above.    PLAN:  We will defer the treatment plan to Dr. [name].  The patient may  require revascularization surgery prior to any operative procedure.                                                 Arturo Morton. Riley Kill, M.D. [facility]    TDS/MEDQ  D:  09/22/2002  T:  09/22/2002  Job:  [id number]   cc:   Cardiac Catheterization Lab

## 2011-03-09 NOTE — Discharge Summary (Signed)
NAMEGIANAH, BATT                ACCOUNT NO.:  [id number]   MEDICAL RECORD NO.:  [id number]          PATIENT TYPE:  INP   LOCATION:  6734                         FACILITY:  [facility]   PHYSICIAN:  Mobolaji B. Corky Downs, M.D.DATE OF BIRTH:  21-Dec-1945   DATE OF ADMISSION:  11/21/2005  DATE OF DISCHARGE:  11/27/2005                                 DISCHARGE SUMMARY   ADDENDUM:   PRIMARY CARE PHYSICIAN:  Areatha Keas, M.D.   GASTROENTEROLOGIST:  Georgiana Spinner, M.D.   This an addendum to an earlier dictation done by Dr. Jarold Song.   ADDITIONAL PROCEDURES:  Upper endoscopy by Dr. Virginia Rochester on November 25, 2005.  Findings: Normal mucosa of the fundus, body, antrum, duodenal bulb, and  second portion of the duodenum. There was a question of yeast in the  esophagus. Brushings were taken.   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 She was admitted for gastroenteritis with persistent nausea  prevalent. There is a concern about cholecystitis since she has had a strong  family history of cholecystitis. The patient was reassured that both CT scan  of the abdomen and right upper quadrant ultrasound did not show evidence of  gallstones nor cholecystitis. Given persistent nausea, a HIDA scan was  scheduled for Monday, December 03, 2005, as an outpatient. The patient will  follow up results with Dr. Phylliss Bob.   Ms. Neth was able to tolerate all feeds without nausea or vomiting prior  to discharge. There was no diarrhea and she was thought safe to be  discharged. Overall it was felt that the chronic narcotics could be  contributing to symptoms.   DISCHARGE MEDICATIONS:  1.  Neurontin 600 mg at bedtime.  2.  Dilaudid 4 mg p.r.n.  3.  Fentanyl patch 100 mcg q.72h.  4.  Zyrtec 1 b.i.d.  5.  Metoprolol 75 mg b.i.d.  6.  Singulair at bedtime.  7.  Nexium 30 mg b.i.d.  8.  Aspirin 81 mg daily.  9.  Lipitor 20 mg daily.  10. Calcium carbonate with vitamin D  one b.i.d.  11. Astelin nasal spray.  12. Nasacort b.i.d.  13. Pulmicort b.i.d.  14. Maxair p.r.n.  15. Glucosamine/chondroitin 1200 mg b.i.d.   FOLLOWUP:  1.  Dr. Virginia Rochester in one week to obtain the results of esophageal brushings.  2.  Dr. Phylliss Bob in one to two weeks.      Mobolaji B. Corky Downs, M.D.  Electronically Signed     MBB/MEDQ  D:  12/08/2005  T:  12/09/2005  Job:  [id number]   cc:   Georgiana Spinner, M.D.  Fax: [phone]   Areatha Keas, M.D.  Fax: [phone]

## 2011-05-18 ENCOUNTER — Encounter: Payer: Self-pay | Admitting: Cardiovascular Disease

## 2011-05-24 ENCOUNTER — Ambulatory Visit: Payer: 59 | Admitting: Cardiovascular Disease

## 2011-06-20 ENCOUNTER — Encounter: Payer: Self-pay | Admitting: Cardiovascular Disease

## 2011-06-20 ENCOUNTER — Ambulatory Visit (INDEPENDENT_AMBULATORY_CARE_PROVIDER_SITE_OTHER): Payer: 59 | Admitting: Cardiovascular Disease

## 2011-06-20 VITALS — BP 135/78 | HR 69 | Ht 62.0 in | Wt 150.1 lb

## 2011-06-20 DIAGNOSIS — I10 Essential (primary) hypertension: Secondary | ICD-10-CM

## 2011-06-20 DIAGNOSIS — I739 Peripheral vascular disease, unspecified: Secondary | ICD-10-CM

## 2011-06-20 DIAGNOSIS — I2510 Atherosclerotic heart disease of native coronary artery without angina pectoris: Secondary | ICD-10-CM

## 2011-06-20 NOTE — Assessment & Plan Note (Signed)
MOderate carotid artery disease. Will repeat carotid dopplers in February 2013.

## 2011-06-20 NOTE — Assessment & Plan Note (Signed)
BP well controlled. No changes.  

## 2011-06-20 NOTE — Assessment & Plan Note (Signed)
Stable. No chest pain. Continue medical management.

## 2011-06-20 NOTE — Progress Notes (Signed)
History of Present Illness:65 yo female with HTN, hyperlipidemia, anti-phospholipid antibody syndrome, GERD, asthma, CAD s/p cath 2003 with 30% ostial LM stenosis, 50% mid LAD, 80% moderate sized Diagonal, 80% Circumflex. There was discussion about bypass but ultimately medical management was pursued. She also has chronic back pain and paroxysmal atrial fibrillation. She has been previously followed in the past by Dr. [name] in our office.  She has a hypercoagulable state but has not been managed on coumadin. She has allergies to heparin and Lovenox. She is seen by a hematologist at [facility] (Dr. Graylon Gunning) and he has told her that she does not need coumadin. She had been on amiodarone in the past but this was stopped  in August 2010 by Dr. [name]. She has been diagnosed with hypothyroidism and has been on Synthroid. She has had no lower extremity edema but she does have chronic problems with pain in her feet from Raynaud's phenomenon and ongoing sciatica. She is seen by Thyra Breed for pain management.  Most recent stress test 2/10 with normal LV function , EF 51%, no ischemia. Last echo in February 2011 with normal LV size, mild LVH, normal LV systolic function with EF of 55%, mild MR with mildly thickened mitral valve leaflets. Carotid artery dopplers December 01, 2010 with bilateral 40-59% ICA stenosis. At her last visit in February 2012, she had c/o some exertional chest pain. I recommended repeat cath but she refused.   She tells me today that she has been feeling ok. She has been active. She has had no chest pain. Her breathing has been ok. BP has been well controlled. She got back from [employer] last week.     Past Medical History  Diagnosis Date  . HTN (hypertension)   . Spondylosis, lumbosacral   . GERD (gastroesophageal reflux disease)   . Asthma     extrinsic; moderate, persistant, nml spirometry 2010, nml CXR 1/08  . Pulmonary embolism     related to back surgery with prior coumadin use,  now off  . DVT (deep venous thrombosis)   . PAF (paroxysmal atrial fibrillation)     not on coumadin therapy  . HLD (hyperlipidemia)   . CAD (coronary artery disease)     80% ostial D1 and moderate 80% mid circ by vath 2003  . Antiphospholipid syndrome     with hypercoagulable state  . Coronary heart disease   . Drug allergy     heparin/lovenox  . Erosive gastritis 1994  . Anemia, iron deficiency     Past Surgical History  Procedure Date  . Back surgery     x12 cervical and lumbar thoracic spine surgery  . Total knee arthroplasty     left  . Cleft lip and palate repair     65 yo   . Hysterectomy   . Spina bifida repair     65 yo     Current Outpatient Prescriptions  Medication Sig Dispense Refill  . aspirin 81 MG EC tablet Take 81 mg by mouth daily.        Marland Kitchen atorvastatin (LIPITOR) 20 MG tablet Take 20 mg by mouth daily.        Marland Kitchen azelastine (ASTELIN) 137 MCG/SPRAY nasal spray Place 1 spray into the nose as needed. Use in each nostril as directed       . beclomethasone (QVAR) 80 MCG/ACT inhaler Inhale 2 puffs into the lungs 3 (three) times daily.        . Calcium Carbonate-Vitamin  D 600-400 MG-UNIT per tablet Take 1 tablet by mouth 2 (two) times daily.        . cyclobenzaprine (FLEXERIL) 10 MG tablet Take 10 mg by mouth 3 (three) times daily as needed.        . desloratadine-pseudoephedrine (CLARINEX-D 12-HOUR) 2.5-120 MG per tablet Take 1 tablet by mouth 2 (two) times daily.        . diazepam (VALIUM) 5 MG tablet Take 5 mg by mouth at bedtime.        . diclofenac-misoprostol (ARTHROTEC 75) 75-0.2 MG per tablet Take 1 tablet by mouth 2 (two) times daily.        Marland Kitchen docusate sodium (COLACE) 100 MG capsule Take 100 mg by mouth 2 (two) times daily.       Marland Kitchen esomeprazole (NEXIUM) 40 MG capsule Take 40 mg by mouth 2 (two) times daily.        . fentaNYL (DURAGESIC - DOSED MCG/HR) 100 MCG/HR Place 1 patch onto the skin every other day.        . Glucosamine-Chondroitin 750-600 MG TABS  Take 1 tablet by mouth 2 (two) times daily.        Marland Kitchen HYDROmorphone (DILAUDID) 4 MG tablet Take 4 mg by mouth every 4 (four) hours as needed.        . hyoscyamine (LEVBID) 0.375 MG 12 hr tablet Take 0.375 mg by mouth 2 (two) times daily as needed.        Marland Kitchen levothyroxine (SYNTHROID, LEVOTHROID) 50 MCG tablet Take 50 mcg by mouth daily.        Marland Kitchen lidocaine (LIDODERM) 5 % Place 1 patch onto the skin as needed. Remove & Discard patch within 12 hours or as directed by MD       . metaxalone (SKELAXIN) 800 MG tablet Take 800 mg by mouth as needed.        . metoprolol (LOPRESSOR) 50 MG tablet Take 50 mg by mouth 2 (two) times daily.        . montelukast (SINGULAIR) 10 MG tablet Take 10 mg by mouth daily.        . Multiple Vitamins-Minerals (CENTRUM SILVER) CHEW Chew 1 tablet by mouth daily.        . nitroGLYCERIN (NITROSTAT) 0.4 MG SL tablet Place 0.4 mg under the tongue every 5 (five) minutes as needed.        . pirbuterol (MAXAIR AUTOHALER) 200 MCG/INH inhaler Inhale 2 puffs into the lungs as needed.        . gabapentin (NEURONTIN) 600 MG tablet Take 600 mg by mouth as needed.          Allergies  Allergen Reactions  . Enoxaparin Sodium     REACTION: thrombocytopenia  . Heparin     REACTION: thrombocytopenia  . Indomethacin   . ZOX:[id number]+[id number] Acid+Aspartame   . Povidone-Iodine     History   Social History  . Marital Status: Married    Spouse Name: N/A    Number of Children: N/A  . Years of Education: N/A   Occupational History  . Not on file.   Social History Main Topics  . Smoking status: Unknown If Ever Smoked  . Smokeless tobacco: Not on file   Comment: no smoking   . Alcohol Use: No  . Drug Use: No  . Sexually Active: Not on file   Other Topics Concern  . Not on file   Social History Narrative   Lives in [place] with husband; has had miscarriages  but no children. Disabled but exercises nearly every day (can only exericse in water - aerobics)Takes opioids  for pain relief; takes no herbal medications; has a heart healthy diet.     Family History  Problem Relation Age of Onset  . Coronary artery disease Sister     MI     Review of Systems:  As stated in the HPI and otherwise negative.   BP 135/78  Pulse 69  Ht 5\' 2"  (1.575 m)  Wt 150 lb 1.9 oz (68.094 kg)  BMI 27.46 kg/m2  Physical Examination: General: Well developed, well nourished, NAD HEENT: OP clear, mucus membranes moist SKIN: warm, dry. No rashes. Neuro: No focal deficits Musculoskeletal: Muscle strength 5/5 all ext Psychiatric: Mood and affect normal Neck: No JVD, no carotid bruits, no thyromegaly, no lymphadenopathy. Lungs:Clear bilaterally, no wheezes, rhonci, crackles Cardiovascular: Regular rate and rhythm. No murmurs, gallops or rubs. Abdomen:Soft. Bowel sounds present. Non-tender.  Extremities: No lower extremity edema. Pulses are 2 + in the bilateral DP/PT.  EKG:NSR, rate 69 bpm. Normal EKG.

## 2011-07-10 ENCOUNTER — Other Ambulatory Visit: Payer: Self-pay | Admitting: Cardiovascular Disease

## 2011-07-16 ENCOUNTER — Telehealth: Payer: Self-pay | Admitting: *Deleted

## 2011-07-16 NOTE — Telephone Encounter (Signed)
Pt apprroved for Nexium until 07/15/2012. Approval letter to be scanned in.Marland Kitchen

## 2011-07-19 ENCOUNTER — Encounter (HOSPITAL_BASED_OUTPATIENT_CLINIC_OR_DEPARTMENT_OTHER)
Admission: RE | Admit: 2011-07-19 | Discharge: 2011-07-19 | Disposition: A | Discharge status: Home / Self Care | Payer: 59 | Source: Ambulatory Visit | Attending: Orthopedic Surgery | Admitting: Orthopedic Surgery

## 2011-07-19 LAB — BASIC METABOLIC PANEL
Calcium: 9.5 mg/dL (ref 8.4–10.5)
GFR calc non Af Amer: >60 mL/min (ref >60)
Sodium: 141 mEq/L (ref 135–145)

## 2011-07-20 ENCOUNTER — Ambulatory Visit (HOSPITAL_BASED_OUTPATIENT_CLINIC_OR_DEPARTMENT_OTHER)
Admission: RE | Admit: 2011-07-20 | Discharge: 2011-07-20 | Disposition: A | Discharge status: Home / Self Care | Payer: 59 | Source: Ambulatory Visit | Attending: Orthopedic Surgery | Admitting: Orthopedic Surgery

## 2011-07-20 DIAGNOSIS — Q059 Spina bifida, unspecified: Secondary | ICD-10-CM | POA: Insufficient documentation

## 2011-07-20 DIAGNOSIS — J45909 Unspecified asthma, uncomplicated: Secondary | ICD-10-CM | POA: Insufficient documentation

## 2011-07-20 DIAGNOSIS — K219 Gastro-esophageal reflux disease without esophagitis: Secondary | ICD-10-CM | POA: Insufficient documentation

## 2011-07-20 DIAGNOSIS — I2510 Atherosclerotic heart disease of native coronary artery without angina pectoris: Secondary | ICD-10-CM | POA: Insufficient documentation

## 2011-07-20 DIAGNOSIS — M19049 Primary osteoarthritis, unspecified hand: Secondary | ICD-10-CM | POA: Insufficient documentation

## 2011-07-20 DIAGNOSIS — D2360 Other benign neoplasm of skin of unspecified upper limb, including shoulder: Secondary | ICD-10-CM | POA: Insufficient documentation

## 2011-07-20 DIAGNOSIS — D689 Coagulation defect, unspecified: Secondary | ICD-10-CM | POA: Insufficient documentation

## 2011-07-20 DIAGNOSIS — Z01812 Encounter for preprocedural laboratory examination: Secondary | ICD-10-CM | POA: Insufficient documentation

## 2011-07-23 LAB — POCT HEMOGLOBIN-HEMACUE: Hemoglobin: 11 g/dL — ABNORMAL LOW (ref 12.0–15.0)

## 2011-07-23 NOTE — Op Note (Addendum)
NAMEBRECKLYNN, Latoya [name]                ACCOUNT NO.:  [id number]  MEDICAL RECORD NO.:  [id number]  LOCATION:                                 FACILITY:  PHYSICIAN:  Latoya [name], M.D. DATE OF BIRTH:  1946/04/29  DATE OF PROCEDURE:  07/20/2011 DATE OF DISCHARGE:                              OPERATIVE REPORT   PREOPERATIVE DIAGNOSIS:  Large mucoid cyst, left long finger, causing wide 8 mm nail groove deformity.  POSTOPERATIVE DIAGNOSIS:  Large mucoid cyst, left long finger, causing wide 8 mm nail groove deformity with documentation by direct inspection of significant osteoarthritis of distal interphalangeal joint, left long finger.  OPERATION: 1. Resection of dorsal nailfold mucoid cyst decompressing the nail     matrix. 2. Two-incision distal interphalangeal joint irrigation and     debridement and osteophyte resection procedure with high-pressure     irrigation utilizing a 10 cc syringe and a 19-gauge dental needle.  OPERATING SURGEON:  Latoya Fitch. Latoya Wilford, MD  ASSISTANT:  Latoya Reeks Dasnoit, PA-C  ANESTHESIA:  General sedation and 2% lidocaine digital block.  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  She has an antiphospholipid antibody, reflux, asthma, and significant coronary artery disease.  She is ALLERGIC TO MULTIPLE MEDICATIONS AND CHLORHEXIDINE.  Preoperatively, she was advised to proceed with debridement of her mucoid cyst and DIP joint debridement.  We confirmed that she could be prepped with Betadine as long as the soap from the Betadine was removed with alcohol perioperatively.  Questions regarding anticipated procedure were invited and answered in detail.  PROCEDURE IN DETAIL:  Latoya [name] was brought to room 1 of the [facility] and placed in supine position upon the operating table.  Following  light sedation provided under Dr. Marella [name] supervision, the left arm was prepped with alcohol followed by placement of 2% lidocaine digital block at metacarpal head level.  Total volume of 5 mL of 2% lidocaine was provided.  The arm was then prepped with Betadine soap and solution followed by cleaning of the Betadine with alcohol.  Sterile drapes were applied followed by exsanguination of left long finger with a gauze wrap and placement of a 1/2-inch Penrose drain over the proximal phalangeal segment as digital tourniquet.  Procedure commenced with a routine surgical time-out.  A curvilinear incision was fashioned on the dorsal ulnar aspect of the DIP joint extending to the cyst.  The cyst was circumferentially dissected, contents were evacuated and the membrane removed.  The ostia to the joint was curetted and cleaned with a rongeur.  Attention was then directed to a dorsal ulnar arthrotomy of the DIP joint.  A triangular portion of the capsule was dissected between the terminal extensor tendon slip and the ulnar collateral ligament.  Marginal osteophytes were removed with a curette and rongeur.  Synovectomy was accomplished with a micro rongeur followed by removal of osteophytes with a micro rongeur.  A similar incision was fashioned on the dorsal radial aspect of the DIP joint with resection of the capsule between the terminal extensor tendon slip and radial  collateral ligament.  Marginal osteophytes were removed followed by 20 cc through-and-through high- pressure irrigation to remove cartilaginous debris.  The wounds were then repaired with trauma sutures of 5-0 nylon. Compressive dressing was applied with Xeroflo sterile gauze and a wrist base Coban dressing.  There were no apparent complications.  For aftercare, [name] will take Keflex 500 mg 1 p.o. q.8 hours x4 days as a prophylactic antibiotic.  She has analgesic medication at home.  We will see her back in followup in 1  week for dressing change, suture removal, and advancement to a rehab program.     Latoya Fitch. Kaydyn Sayas, M.D.     RVS/MEDQ  D:  07/20/2011  T:  07/20/2011  Job:  [id number]  Electronically Signed by Latoya [name] M.D. on 07/29/2011 07:05:00 AM

## 2011-12-10 ENCOUNTER — Other Ambulatory Visit: Payer: Self-pay | Admitting: Cardiology

## 2011-12-10 DIAGNOSIS — I6529 Occlusion and stenosis of unspecified carotid artery: Secondary | ICD-10-CM

## 2011-12-12 ENCOUNTER — Ambulatory Visit (INDEPENDENT_AMBULATORY_CARE_PROVIDER_SITE_OTHER): Payer: 59 | Admitting: Cardiovascular Disease

## 2011-12-12 ENCOUNTER — Encounter (INDEPENDENT_AMBULATORY_CARE_PROVIDER_SITE_OTHER): Payer: 59

## 2011-12-12 ENCOUNTER — Encounter: Payer: Self-pay | Admitting: Cardiovascular Disease

## 2011-12-12 ENCOUNTER — Encounter: Payer: 59 | Admitting: Cardiology

## 2011-12-12 VITALS — BP 150/84 | HR 86 | Ht 63.0 in | Wt 147.0 lb

## 2011-12-12 DIAGNOSIS — I2510 Atherosclerotic heart disease of native coronary artery without angina pectoris: Secondary | ICD-10-CM

## 2011-12-12 DIAGNOSIS — I10 Essential (primary) hypertension: Secondary | ICD-10-CM

## 2011-12-12 DIAGNOSIS — I4891 Unspecified atrial fibrillation: Secondary | ICD-10-CM

## 2011-12-12 DIAGNOSIS — I6529 Occlusion and stenosis of unspecified carotid artery: Secondary | ICD-10-CM

## 2011-12-12 NOTE — Patient Instructions (Signed)
Your physician wants you to follow-up in:  6 months. You will receive a reminder letter in the mail two months in advance. If you don't receive a letter, please call our office to schedule the follow-up appointment.   

## 2011-12-12 NOTE — Assessment & Plan Note (Signed)
Occasional palpitations. She has not been on coumadin in past. She will consider Xarelto in the future.

## 2011-12-12 NOTE — Assessment & Plan Note (Signed)
BP is elevated. She will follow at home and let us know if it remains elevated.

## 2011-12-12 NOTE — Progress Notes (Signed)
History of Present Illness: 66 yo female with HTN, hyperlipidemia, anti-phospholipid antibody syndrome, GERD, asthma, CAD s/p cath 2003 with 30% ostial LM stenosis, 50% mid LAD, 80% moderate sized Diagonal, 80% Circumflex. There was discussion about bypass but ultimately medical management was pursued. She also has chronic back pain and paroxysmal atrial fibrillation. She has been previously followed in the past by Dr. [name] in our office. She has a hypercoagulable state but has not been managed on coumadin. She has allergies to heparin and Lovenox. She is seen by a hematologist at [facility] (Dr. Graylon Gunning) and he has told her that she does not need coumadin. She had been on amiodarone in the past but this was stopped in August 2010 by Dr. [name]. She has been diagnosed with hypothyroidism and has been on Synthroid. She has had no lower extremity edema but she does have chronic problems with pain in her feet from Raynaud's phenomenon and ongoing sciatica. She is seen by Thyra Breed for pain management. Most recent stress test 2/10 with normal LV function , EF 51%, no ischemia. Last echo in February 2011 with normal LV size, mild LVH, normal LV systolic function with EF of 55%, mild MR with mildly thickened mitral valve leaflets. Carotid artery dopplers today with stable 40-59% bilateral stenosis. (Prior to that December 01, 2010 with bilateral 40-59% ICA stenosis). At her visit in February 2012, she had c/o some exertional chest pain. I recommended repeat cath but she refused. I last saw her in 06/20/11 and her chest pain was better.  She tells me today that she has been feeling ok. She has been active. She has had no chest pain. Her breathing has been ok. BP has been well controlled. She is swimming 3-4 days per week. She has noticed several episodes of palpitations that are short lived for several minutes.   Primary Care Physician: Shary Decamp  Last Lipid Profile: Followed in primary care.   Past  Medical History  Diagnosis Date  . HTN (hypertension)   . Spondylosis, lumbosacral   . GERD (gastroesophageal reflux disease)   . Asthma     extrinsic; moderate, persistant, nml spirometry 2010, nml CXR 1/08  . Pulmonary embolism     related to back surgery with prior coumadin use, now off  . DVT (deep venous thrombosis)   . PAF (paroxysmal atrial fibrillation)     not on coumadin therapy  . HLD (hyperlipidemia)   . CAD (coronary artery disease)     50% mid LAD, 80% ostial D1 and moderate 80% mid circ by vath 2003  . Antiphospholipid syndrome     with hypercoagulable state  . Coronary heart disease   . Drug allergy     heparin/lovenox  . Erosive gastritis 1994  . Anemia, iron deficiency     Past Surgical History  Procedure Date  . Back surgery     x12 cervical and lumbar thoracic spine surgery  . Total knee arthroplasty     left  . Cleft lip and palate repair     66 yo   . Hysterectomy   . Spina bifida repair     66 yo     Current Outpatient Prescriptions  Medication Sig Dispense Refill  . aspirin 81 MG EC tablet Take 81 mg by mouth daily.        Marland Kitchen atorvastatin (LIPITOR) 20 MG tablet Take 20 mg by mouth daily.        Marland Kitchen azelastine (ASTELIN) 137 MCG/SPRAY nasal spray  Place 1 spray into the nose as needed. Use in each nostril as directed       . Calcium Carbonate-Vitamin D 600-400 MG-UNIT per tablet Take 1 tablet by mouth 2 (two) times daily.        . cyclobenzaprine (FLEXERIL) 10 MG tablet Take 10 mg by mouth 3 (three) times daily as needed.        . diazepam (VALIUM) 5 MG tablet Take 5 mg by mouth at bedtime.        . diclofenac-misoprostol (ARTHROTEC 75) 75-0.2 MG per tablet Take 1 tablet by mouth 2 (two) times daily.        Marland Kitchen docusate sodium (COLACE) 100 MG capsule Take 100 mg by mouth 2 (two) times daily.       Marland Kitchen esomeprazole (NEXIUM) 40 MG capsule Take 40 mg by mouth 2 (two) times daily.        . fentaNYL (DURAGESIC - DOSED MCG/HR) 100 MCG/HR Place 1 patch onto the  skin every other day.        . gabapentin (NEURONTIN) 600 MG tablet Take 600 mg by mouth as needed.        . Glucosamine-Chondroitin 750-600 MG TABS Take 1 tablet by mouth 2 (two) times daily.        Marland Kitchen HYDROmorphone (DILAUDID) 4 MG tablet Take 4 mg by mouth every 4 (four) hours as needed.        Marland Kitchen levothyroxine (SYNTHROID, LEVOTHROID) 50 MCG tablet Take 50 mcg by mouth daily.        Marland Kitchen lidocaine (LIDODERM) 5 % Place 1 patch onto the skin as needed. Remove & Discard patch within 12 hours or as directed by MD       . metaxalone (SKELAXIN) 800 MG tablet Take 800 mg by mouth as needed.        . metoprolol (LOPRESSOR) 50 MG tablet TAKE ONE TABLET BY MOUTH TWICE A DAY  60 tablet  10  . montelukast (SINGULAIR) 10 MG tablet Take 10 mg by mouth daily.        . Multiple Vitamins-Minerals (CENTRUM SILVER) CHEW Chew 1 tablet by mouth daily.        . nitroGLYCERIN (NITROSTAT) 0.4 MG SL tablet Place 0.4 mg under the tongue every 5 (five) minutes as needed.        . pirbuterol (MAXAIR AUTOHALER) 200 MCG/INH inhaler Inhale 2 puffs into the lungs as needed.          Allergies  Allergen Reactions  . Augmentin Diarrhea  . Enoxaparin Sodium     REACTION: thrombocytopenia  . Heparin     REACTION: thrombocytopenia  . Indomethacin   . ZOX:[id number] Acid+Aspartame   . Povidone-Iodine     History   Social History  . Marital Status: Married    Spouse Name: N/A    Number of Children: N/A  . Years of Education: N/A   Occupational History  . Not on file.   Social History Main Topics  . Smoking status: Former Smoker    Quit date: 10/22/1972  . Smokeless tobacco: Not on file   Comment: no smoking   . Alcohol Use: No  . Drug Use: No  . Sexually Active: Not on file   Other Topics Concern  . Not on file   Social History Narrative   Lives in [place] with husband; has had miscarriages but no children. Disabled but exercises nearly every day (can only exericse in water - aerobics)Takes  opioids for pain relief;  takes no herbal medications; has a heart healthy diet.     Family History  Problem Relation Age of Onset  . Coronary artery disease Sister     MI     Review of Systems:  As stated in the HPI and otherwise negative.   BP 150/84  Pulse 86  Ht 5\' 3"  (1.6 m)  Wt 147 lb (66.679 kg)  BMI 26.04 kg/m2  Physical Examination: General: Well developed, well nourished, NAD HEENT: OP clear, mucus membranes moist SKIN: warm, dry. No rashes. Neuro: No focal deficits Musculoskeletal: Muscle strength 5/5 all ext Psychiatric: Mood and affect normal Neck: No JVD, no carotid bruits, no thyromegaly, no lymphadenopathy. Lungs:Clear bilaterally, no wheezes, rhonci, crackles Cardiovascular: Regular rate and rhythm. No murmurs, gallops or rubs. Abdomen:Soft. Bowel sounds present. Non-tender.  Extremities: No lower extremity edema. Pulses are 2 + in the bilateral DP/PT.  Carotid artery dopplers: 12/12/11: Bilateral 40-59% stenosis. Patent vertebral artery flow. Stable.

## 2011-12-12 NOTE — Assessment & Plan Note (Signed)
Stable No changes 

## 2012-01-03 ENCOUNTER — Other Ambulatory Visit (HOSPITAL_COMMUNITY): Payer: Self-pay | Admitting: Neurosurgery

## 2012-01-03 ENCOUNTER — Other Ambulatory Visit: Payer: Self-pay | Admitting: Neurosurgery

## 2012-01-03 DIAGNOSIS — M545 Low back pain: Secondary | ICD-10-CM

## 2012-01-03 DIAGNOSIS — M546 Pain in thoracic spine: Secondary | ICD-10-CM

## 2012-01-03 DIAGNOSIS — M542 Cervicalgia: Secondary | ICD-10-CM

## 2012-01-22 ENCOUNTER — Other Ambulatory Visit: Payer: Self-pay | Admitting: Cardiovascular Disease

## 2012-01-28 DIAGNOSIS — G894 Chronic pain syndrome: Secondary | ICD-10-CM | POA: Diagnosis not present

## 2012-01-28 DIAGNOSIS — M961 Postlaminectomy syndrome, not elsewhere classified: Secondary | ICD-10-CM | POA: Diagnosis not present

## 2012-01-28 DIAGNOSIS — M47817 Spondylosis without myelopathy or radiculopathy, lumbosacral region: Secondary | ICD-10-CM | POA: Diagnosis not present

## 2012-01-28 DIAGNOSIS — M5412 Radiculopathy, cervical region: Secondary | ICD-10-CM | POA: Diagnosis not present

## 2012-01-31 ENCOUNTER — Other Ambulatory Visit (HOSPITAL_COMMUNITY): Payer: 59

## 2012-02-19 ENCOUNTER — Ambulatory Visit (INDEPENDENT_AMBULATORY_CARE_PROVIDER_SITE_OTHER): Payer: Medicare Other | Admitting: Internal Medicine

## 2012-02-19 VITALS — BP 167/76 | HR 77 | Temp 97.7°F | Resp 20 | Ht 60.5 in | Wt 147.7 lb

## 2012-02-19 DIAGNOSIS — H8110 Benign paroxysmal vertigo, unspecified ear: Secondary | ICD-10-CM | POA: Diagnosis not present

## 2012-02-19 DIAGNOSIS — D649 Anemia, unspecified: Secondary | ICD-10-CM

## 2012-02-19 DIAGNOSIS — R509 Fever, unspecified: Secondary | ICD-10-CM | POA: Diagnosis not present

## 2012-02-19 DIAGNOSIS — J018 Other acute sinusitis: Secondary | ICD-10-CM

## 2012-02-19 DIAGNOSIS — J329 Chronic sinusitis, unspecified: Secondary | ICD-10-CM

## 2012-02-19 LAB — POCT CBC
Granulocyte percent: 84.3 %G — AB (ref 37–80)
Lymph, poc: 2.1 (ref 0.6–3.4)
MCHC: 31.4 g/dL — AB (ref 31.8–35.4)
MPV: 7.9 fL (ref 0–99.8)
POC Granulocyte: 14.2 — AB (ref 2–6.9)
POC LYMPH PERCENT: 12.5 %L (ref 10–50)
POC MID %: 3.2 %M (ref 0–12)
RDW, POC: 17.4 %

## 2012-02-19 MED ORDER — SULFAMETHOXAZOLE-TRIMETHOPRIM 800-160 MG PO TABS: 1.0000 | ORAL_TABLET | Freq: Two times a day (BID) | ORAL | Status: AC

## 2012-02-19 MED ORDER — CEFTRIAXONE SODIUM 1 G IJ SOLR
1.0000 g | INTRAMUSCULAR | Status: DC
  Administered 2012-02-19: 1 g via INTRAMUSCULAR

## 2012-02-19 NOTE — Progress Notes (Signed)
  Subjective:    Patient ID: Latoya [name], female    DOB: 11-03-1945, 66 y.o.   MRN: [id number]  HPI yellow sinus drainage  Fever  facial pain Onset 4 days ago Chills weakness No appetite  no chest pain no shortness in breath symptoms are moderate in severity Dizzy taking meclizine several times a day    Review of Systems  Constitutional: Positive for fever, chills, diaphoresis, activity change, appetite change and fatigue. Negative for unexpected weight change.  HENT: Positive for congestion, rhinorrhea, sneezing and postnasal drip. Negative for facial swelling.   Eyes: Negative.   Respiratory: Negative.   Cardiovascular: Negative.   Gastrointestinal: Negative.   Genitourinary: Negative.   Musculoskeletal: Negative.   Neurological: Positive for dizziness and light-headedness. Negative for headaches.  Hematological: Negative.   Psychiatric/Behavioral: Negative.   All other systems reviewed and are negative.       Objective:   Physical Exam  Nursing note and vitals reviewed. Constitutional: She is oriented to person, place, and time. She appears well-developed and well-nourished.  HENT:  Head: Normocephalic and atraumatic.  Right Ear: External ear normal.  Left Ear: External ear normal.       Tender maxillary and frontal sinuses  Eyes: Conjunctivae and EOM are normal. Pupils are equal, round, and reactive to light.  Neck: Normal range of motion. Neck supple.  Cardiovascular: Normal rate, regular rhythm and normal heart sounds.   Pulmonary/Chest: Effort normal and breath sounds normal.  Abdominal: Soft. Bowel sounds are normal.  Musculoskeletal: Normal range of motion.  Neurological: She is alert and oriented to person, place, and time. She has normal reflexes.  Skin: Skin is warm and dry.  Psychiatric: She has a normal mood and affect. Her behavior is normal. Judgment and thought content normal.    Fever is above 101 by history  Results for orders placed in  visit on 02/19/12  POCT CBC      Component Value Range   WBC 16.9 (*) 4.6 - 10.2 (K/uL)   Lymph, poc 2.1  0.6 - 3.4    POC LYMPH PERCENT 12.5  10 - 50 (%L)   MID (cbc) 0.5  0 - 0.9    POC MID % 3.2  0 - 12 (%M)   POC Granulocyte 14.2 (*) 2 - 6.9    Granulocyte percent 84.3 (*) 37 - 80 (%G)   RBC 4.45  4.04 - 5.48 (M/uL)   Hemoglobin 10.8 (*) 12.2 - 16.2 (g/dL)   HCT, POC 56.2 (*) 13.0 - 47.9 (%)   MCV 77.4 (*) 80 - 97 (fL)   MCH, POC 24.3 (*) 27 - 31.2 (pg)   MCHC 31.4 (*) 31.8 - 35.4 (g/dL)   RDW, POC 86.5     Platelet Count, POC 477 (*) 142 - 424 (K/uL)   MPV 7.9  0 - 99.8 (fL)     elevated wbc 16.9 pt is also anemic.  Assessment & Plan:  Cbc, Rocephin im and treat with antibiotics . Bactrim ds 1 tab 2 times daily for 14 days.

## 2012-02-19 NOTE — Patient Instructions (Signed)
Bactrim ds 1 tab 2 times daily for 14 days. Tylenol as directed by the manufacturer for fever and aches. Finish all the antibiotics. retrun to [facility] if you still have fever after 24 hours of antibiotic or if you feel worse or have any other symptoms.

## 2012-02-26 DIAGNOSIS — E785 Hyperlipidemia, unspecified: Secondary | ICD-10-CM | POA: Diagnosis not present

## 2012-02-26 DIAGNOSIS — R7301 Impaired fasting glucose: Secondary | ICD-10-CM | POA: Diagnosis not present

## 2012-02-26 DIAGNOSIS — M1990 Unspecified osteoarthritis, unspecified site: Secondary | ICD-10-CM | POA: Diagnosis not present

## 2012-02-26 DIAGNOSIS — E039 Hypothyroidism, unspecified: Secondary | ICD-10-CM | POA: Diagnosis not present

## 2012-02-26 DIAGNOSIS — I2510 Atherosclerotic heart disease of native coronary artery without angina pectoris: Secondary | ICD-10-CM | POA: Diagnosis not present

## 2012-02-27 DIAGNOSIS — M4000 Postural kyphosis, site unspecified: Secondary | ICD-10-CM | POA: Diagnosis not present

## 2012-02-27 DIAGNOSIS — M961 Postlaminectomy syndrome, not elsewhere classified: Secondary | ICD-10-CM | POA: Diagnosis not present

## 2012-02-27 DIAGNOSIS — G894 Chronic pain syndrome: Secondary | ICD-10-CM | POA: Diagnosis not present

## 2012-03-06 ENCOUNTER — Other Ambulatory Visit (HOSPITAL_COMMUNITY): Payer: 59

## 2012-03-10 DIAGNOSIS — J309 Allergic rhinitis, unspecified: Secondary | ICD-10-CM | POA: Diagnosis not present

## 2012-03-10 DIAGNOSIS — J45909 Unspecified asthma, uncomplicated: Secondary | ICD-10-CM | POA: Diagnosis not present

## 2012-03-26 DIAGNOSIS — G894 Chronic pain syndrome: Secondary | ICD-10-CM | POA: Diagnosis not present

## 2012-03-26 DIAGNOSIS — M47817 Spondylosis without myelopathy or radiculopathy, lumbosacral region: Secondary | ICD-10-CM | POA: Diagnosis not present

## 2012-03-26 DIAGNOSIS — M542 Cervicalgia: Secondary | ICD-10-CM | POA: Diagnosis not present

## 2012-03-26 DIAGNOSIS — M961 Postlaminectomy syndrome, not elsewhere classified: Secondary | ICD-10-CM | POA: Diagnosis not present

## 2012-04-03 ENCOUNTER — Other Ambulatory Visit (HOSPITAL_COMMUNITY): Payer: Self-pay

## 2012-04-03 ENCOUNTER — Inpatient Hospital Stay (HOSPITAL_COMMUNITY): Admission: RE | Admit: 2012-04-03 | Payer: Self-pay | Source: Ambulatory Visit

## 2012-04-28 DIAGNOSIS — G894 Chronic pain syndrome: Secondary | ICD-10-CM | POA: Diagnosis not present

## 2012-04-28 DIAGNOSIS — M5412 Radiculopathy, cervical region: Secondary | ICD-10-CM | POA: Diagnosis not present

## 2012-04-28 DIAGNOSIS — M961 Postlaminectomy syndrome, not elsewhere classified: Secondary | ICD-10-CM | POA: Diagnosis not present

## 2012-05-01 ENCOUNTER — Telehealth: Payer: Self-pay | Admitting: Cardiovascular Disease

## 2012-05-01 NOTE — Telephone Encounter (Signed)
PT CALLING TO SEE IF CAROTID IS NEEDED PRIOR TO NEXT VISIT? PLS CALL [phone] OR [phone] PT GOES BY Latoya [name]

## 2012-05-01 NOTE — Telephone Encounter (Signed)
Pt wants to know if she needs a carotid doppler done?  She states her last one was in February and she thought she was supposed to have one every 6 months.

## 2012-05-01 NOTE — Telephone Encounter (Signed)
F/U is recommended in February of 2014. Can we let her know? Thanks, chris

## 2012-05-02 NOTE — Telephone Encounter (Signed)
Pt.notified

## 2012-05-05 ENCOUNTER — Other Ambulatory Visit: Payer: Self-pay | Admitting: Neurosurgery

## 2012-05-05 DIAGNOSIS — M545 Low back pain: Secondary | ICD-10-CM | POA: Diagnosis not present

## 2012-05-05 DIAGNOSIS — M546 Pain in thoracic spine: Secondary | ICD-10-CM

## 2012-05-05 DIAGNOSIS — M542 Cervicalgia: Secondary | ICD-10-CM

## 2012-05-09 ENCOUNTER — Other Ambulatory Visit: Payer: Self-pay

## 2012-05-12 ENCOUNTER — Ambulatory Visit
Admission: RE | Admit: 2012-05-12 | Discharge: 2012-05-12 | Disposition: A | Discharge status: Home / Self Care | Payer: Medicare Other | Source: Ambulatory Visit | Attending: Neurosurgery | Admitting: Neurosurgery

## 2012-05-12 DIAGNOSIS — M545 Low back pain: Secondary | ICD-10-CM

## 2012-05-12 DIAGNOSIS — M542 Cervicalgia: Secondary | ICD-10-CM

## 2012-05-12 DIAGNOSIS — M5137 Other intervertebral disc degeneration, lumbosacral region: Secondary | ICD-10-CM | POA: Diagnosis not present

## 2012-05-12 DIAGNOSIS — M5030 Other cervical disc degeneration, unspecified cervical region: Secondary | ICD-10-CM | POA: Diagnosis not present

## 2012-05-12 DIAGNOSIS — IMO0002 Reserved for concepts with insufficient information to code with codable children: Secondary | ICD-10-CM | POA: Diagnosis not present

## 2012-05-12 DIAGNOSIS — M546 Pain in thoracic spine: Secondary | ICD-10-CM

## 2012-06-02 DIAGNOSIS — M545 Low back pain: Secondary | ICD-10-CM | POA: Diagnosis not present

## 2012-06-03 DIAGNOSIS — IMO0002 Reserved for concepts with insufficient information to code with codable children: Secondary | ICD-10-CM | POA: Diagnosis not present

## 2012-06-03 DIAGNOSIS — M47817 Spondylosis without myelopathy or radiculopathy, lumbosacral region: Secondary | ICD-10-CM | POA: Diagnosis not present

## 2012-06-03 DIAGNOSIS — G894 Chronic pain syndrome: Secondary | ICD-10-CM | POA: Diagnosis not present

## 2012-06-07 ENCOUNTER — Other Ambulatory Visit: Payer: Self-pay | Admitting: Cardiovascular Disease

## 2012-06-27 ENCOUNTER — Ambulatory Visit: Payer: Self-pay | Admitting: Cardiovascular Disease

## 2012-06-30 DIAGNOSIS — M4000 Postural kyphosis, site unspecified: Secondary | ICD-10-CM | POA: Diagnosis not present

## 2012-06-30 DIAGNOSIS — M47817 Spondylosis without myelopathy or radiculopathy, lumbosacral region: Secondary | ICD-10-CM | POA: Diagnosis not present

## 2012-06-30 DIAGNOSIS — E785 Hyperlipidemia, unspecified: Secondary | ICD-10-CM | POA: Diagnosis not present

## 2012-06-30 DIAGNOSIS — R7301 Impaired fasting glucose: Secondary | ICD-10-CM | POA: Diagnosis not present

## 2012-06-30 DIAGNOSIS — M961 Postlaminectomy syndrome, not elsewhere classified: Secondary | ICD-10-CM | POA: Diagnosis not present

## 2012-06-30 DIAGNOSIS — G894 Chronic pain syndrome: Secondary | ICD-10-CM | POA: Diagnosis not present

## 2012-06-30 DIAGNOSIS — E559 Vitamin D deficiency, unspecified: Secondary | ICD-10-CM | POA: Diagnosis not present

## 2012-06-30 DIAGNOSIS — E039 Hypothyroidism, unspecified: Secondary | ICD-10-CM | POA: Diagnosis not present

## 2012-07-04 DIAGNOSIS — I2510 Atherosclerotic heart disease of native coronary artery without angina pectoris: Secondary | ICD-10-CM | POA: Diagnosis not present

## 2012-07-04 DIAGNOSIS — E785 Hyperlipidemia, unspecified: Secondary | ICD-10-CM | POA: Diagnosis not present

## 2012-07-04 DIAGNOSIS — E039 Hypothyroidism, unspecified: Secondary | ICD-10-CM | POA: Diagnosis not present

## 2012-07-04 DIAGNOSIS — R7301 Impaired fasting glucose: Secondary | ICD-10-CM | POA: Diagnosis not present

## 2012-07-04 DIAGNOSIS — D509 Iron deficiency anemia, unspecified: Secondary | ICD-10-CM | POA: Diagnosis not present

## 2012-07-25 DIAGNOSIS — M159 Polyosteoarthritis, unspecified: Secondary | ICD-10-CM | POA: Diagnosis not present

## 2012-07-25 DIAGNOSIS — IMO0002 Reserved for concepts with insufficient information to code with codable children: Secondary | ICD-10-CM | POA: Diagnosis not present

## 2012-07-25 DIAGNOSIS — D6859 Other primary thrombophilia: Secondary | ICD-10-CM | POA: Diagnosis not present

## 2012-07-28 DIAGNOSIS — M4716 Other spondylosis with myelopathy, lumbar region: Secondary | ICD-10-CM | POA: Diagnosis not present

## 2012-07-28 DIAGNOSIS — M961 Postlaminectomy syndrome, not elsewhere classified: Secondary | ICD-10-CM | POA: Diagnosis not present

## 2012-07-28 DIAGNOSIS — M47817 Spondylosis without myelopathy or radiculopathy, lumbosacral region: Secondary | ICD-10-CM | POA: Diagnosis not present

## 2012-07-28 DIAGNOSIS — M4000 Postural kyphosis, site unspecified: Secondary | ICD-10-CM | POA: Diagnosis not present

## 2012-08-01 ENCOUNTER — Encounter: Payer: Self-pay | Admitting: Cardiovascular Disease

## 2012-08-01 ENCOUNTER — Ambulatory Visit (INDEPENDENT_AMBULATORY_CARE_PROVIDER_SITE_OTHER): Payer: Medicare Other | Admitting: Cardiovascular Disease

## 2012-08-01 VITALS — BP 154/87 | HR 102 | Ht 62.0 in | Wt 152.0 lb

## 2012-08-01 DIAGNOSIS — I6529 Occlusion and stenosis of unspecified carotid artery: Secondary | ICD-10-CM

## 2012-08-01 DIAGNOSIS — I2510 Atherosclerotic heart disease of native coronary artery without angina pectoris: Secondary | ICD-10-CM

## 2012-08-01 DIAGNOSIS — I10 Essential (primary) hypertension: Secondary | ICD-10-CM

## 2012-08-01 DIAGNOSIS — Z01810 Encounter for preprocedural cardiovascular examination: Secondary | ICD-10-CM | POA: Diagnosis not present

## 2012-08-01 NOTE — Progress Notes (Signed)
History of Present Illness: 66 yo female with HTN, hyperlipidemia, anti-phospholipid antibody syndrome, GERD, asthma, CAD s/p cath 2003 with 30% ostial LM stenosis, 50% mid LAD, 80% moderate sized Diagonal, 80% Circumflex. There was discussion about bypass but ultimately medical management was pursued. She also has chronic back pain and paroxysmal atrial fibrillation. She has been previously followed by Dr. [name] in our office. She has a hypercoagulable state but has not been managed on coumadin recently. She has allergies to heparin (anaphylaxis) and Lovenox. Her coumadin had been stopped in the past because of trouble regulating her dose. She is seen by a hematologist at [facility] (Dr. Graylon Gunning) and he has told her that she does not need coumadin. She had been on amiodarone in the past but this was stopped in August 2010 by Dr. [name]. She has been diagnosed with hypothyroidism and has been on Synthroid. She has had no lower extremity edema but she does have chronic problems with pain in her feet from Raynaud's phenomenon and ongoing sciatica. She is seen by Thyra Breed for pain management. Most recent stress test 2/10 with normal LV function , EF 51%, no ischemia. Last echo in February 2011 with normal LV size, mild LVH, normal LV systolic function with EF of 55%, mild MR with mildly thickened mitral valve leaflets. Carotid artery dopplers 12/12/11 with stable 40-59% bilateral stenosis. At her visit in February 2012, she had c/o some exertional chest pain. I recommended repeat cath but she refused. I last saw her in February 2013 and her chest pain was better.   She tells me today that she has been feeling ok. She has been evaluated recently by her Neurosurgeon and she has been found to have worsening of her L5-S1 stenosis and will need surgery soon.  She has had no chest pain. Her breathing has been ok. BP has been well controlled at home. She is known to have moderately severe CAD by cath 2003 and has  refused stress testing recently.   Primary Care Physician: Shary Decamp   Last Lipid Profile: Followed in primary care.    Primary Care Physician:  Last Lipid Profile:Lipid Panel     Component Value Date/Time   CHOL 171 08/05/2009 0935   TRIG 259.0* 08/05/2009 0935   HDL 38.20* 08/05/2009 0935   CHOLHDL 4 08/05/2009 0935   VLDL 51.8* 08/05/2009 0935     Past Medical History  Diagnosis Date  . HTN (hypertension)   . Spondylosis, lumbosacral   . GERD (gastroesophageal reflux disease)   . Asthma     extrinsic; moderate, persistant, nml spirometry 2010, nml CXR 1/08  . Pulmonary embolism     related to back surgery with prior coumadin use, now off  . DVT (deep venous thrombosis)   . PAF (paroxysmal atrial fibrillation)     not on coumadin therapy  . HLD (hyperlipidemia)   . CAD (coronary artery disease)     50% mid LAD, 80% ostial D1 and moderate 80% mid circ by vath 2003  . Antiphospholipid syndrome     with hypercoagulable state  . Coronary heart disease   . Drug allergy     heparin/lovenox  . Erosive gastritis 1994  . Anemia, iron deficiency     Past Surgical History  Procedure Date  . Back surgery     x12 cervical and lumbar thoracic spine surgery  . Total knee arthroplasty     left  . Cleft lip and palate repair     66  yo   . Hysterectomy   . Spina bifida repair     66 yo     Current Outpatient Prescriptions  Medication Sig Dispense Refill  . albuterol (PROVENTIL) (2.5 MG/3ML) 0.083% nebulizer solution Take 2.5 mg by nebulization every 6 (six) hours as needed.      Marland Kitchen aspirin 81 MG EC tablet Take 81 mg by mouth daily.        Marland Kitchen atorvastatin (LIPITOR) 40 MG tablet Take 20 mg by mouth daily.      Marland Kitchen azelastine (ASTELIN) 137 MCG/SPRAY nasal spray Place 1 spray into the nose as needed. Use in each nostril as directed       . beclomethasone (QVAR) 80 MCG/ACT inhaler Inhale 2 puffs into the lungs 3 (three) times daily.      . Calcium Carbonate-Vitamin D 600-400  MG-UNIT per tablet Take 1 tablet by mouth 2 (two) times daily.        . cyclobenzaprine (FLEXERIL) 10 MG tablet Take 10 mg by mouth 3 (three) times daily as needed.        . diazepam (VALIUM) 5 MG tablet Take 5 mg by mouth at bedtime.        . diclofenac-misoprostol (ARTHROTEC 75) 75-0.2 MG per tablet ON HOLD ON FOR SURGERY      . docusate sodium (COLACE) 100 MG capsule Take 100 mg by mouth 2 (two) times daily.       Marland Kitchen esomeprazole (NEXIUM) 40 MG capsule Take 40 mg by mouth 2 (two) times daily.        . fentaNYL (DURAGESIC - DOSED MCG/HR) 100 MCG/HR Place 1 patch onto the skin every other day.        . gabapentin (NEURONTIN) 600 MG tablet Take 600 mg by mouth at bedtime. 1 1/2 tab as needded      . Glucosamine-Chondroitin 750-600 MG TABS Take 1 tablet by mouth 2 (two) times daily.        Marland Kitchen HYDROmorphone (DILAUDID) 4 MG tablet Take 4-8 mg by mouth every 4 (four) hours as needed. For pain      . levothyroxine (SYNTHROID, LEVOTHROID) 75 MCG tablet Take 75 mcg by mouth daily.      Marland Kitchen lidocaine (LIDODERM) 5 % Place 1 patch onto the skin as needed. Remove & Discard patch within 12 hours or as directed by MD       . Loratadine-Pseudoephedrine (CLARITIN-D 12 HOUR PO) Take by mouth. 1 tab twice a day      . metaxalone (SKELAXIN) 800 MG tablet Take 400 mg by mouth every 8 (eight) hours as needed. For muscle pain      . metoprolol (LOPRESSOR) 50 MG tablet Take 50 mg by mouth 2 (two) times daily.      . metoprolol (LOPRESSOR) 50 MG tablet TAKE ONE TABLET BY MOUTH TWICE A DAY  60 tablet  10  . montelukast (SINGULAIR) 10 MG tablet Take 10 mg by mouth at bedtime.       . Multiple Vitamins-Minerals (CENTRUM SILVER) CHEW Chew 1 tablet by mouth daily.        . nitroGLYCERIN (NITROSTAT) 0.4 MG SL tablet Place 0.4 mg under the tongue every 5 (five) minutes as needed.        . triamcinolone (NASACORT) 55 MCG/ACT nasal inhaler Place 2 sprays into the nose daily.        Allergies  Allergen Reactions  .  Amoxicillin-Pot Clavulanate   . Amoxicillin-Pot Clavulanate Diarrhea  . Enoxaparin Sodium  REACTION: thrombocytopenia  . Heparin     REACTION: thrombocytopenia  . Indomethacin   . Lactose Intolerance (Gi)   . Pork-Derived Products   . Povidone     History   Social History  . Marital Status: Married    Spouse Name: N/A    Number of Children: N/A  . Years of Education: N/A   Occupational History  . Not on file.   Social History Main Topics  . Smoking status: Former Smoker    Quit date: 10/22/1972  . Smokeless tobacco: Not on file   Comment: no smoking   . Alcohol Use: No  . Drug Use: No  . Sexually Active: Not on file   Other Topics Concern  . Not on file   Social History Narrative   Lives in [place] with husband; has had miscarriages but no children. Disabled but exercises nearly every day (can only exericse in water - aerobics)Takes opioids for pain relief; takes no herbal medications; has a heart healthy diet.     Family History  Problem Relation Age of Onset  . Coronary artery disease Sister     MI     Review of Systems:  As stated in the HPI and otherwise negative.   BP 154/87  Pulse 102  Ht 5\' 2"  (1.575 m)  Wt 152 lb (68.947 kg)  BMI 27.80 kg/m2  Physical Examination: General: Well developed, well nourished, NAD HEENT: OP clear, mucus membranes moist SKIN: warm, dry. No rashes. Neuro: No focal deficits Musculoskeletal: Muscle strength 5/5 all ext Psychiatric: Mood and affect normal Neck: No JVD, no carotid bruits, no thyromegaly, no lymphadenopathy. Lungs:Clear bilaterally, no wheezes, rhonci, crackles Cardiovascular: Regular rate and rhythm. No murmurs, gallops or rubs. Abdomen:Soft. Bowel sounds present. Non-tender.  Extremities: No lower extremity edema. Pulses are 2 + in the bilateral DP/PT.  EKG: Sinus tachycardia, rate 102 bpm.   Assessment and Plan:   1. Pre-operative cardiovascular examination: She has moderate CAD by cath in 2003. She  is having no symptoms of chest pain or SOB but she is very inactive and confined to her wheelchair. Will arrange Lexiscan myoview to exclude ischemia.   2. CAD: As above. Continue current medical management  3. PAROXYSMAL ATRIAL FIBRILLATION: Occasional palpitations. She has not been on coumadin recently per recommendations of her hematologist. She will consider Xarelto in the future.   4. HYPERTENSION: BP is elevated. She does not wish to alter her medications. I have asked her to follow her BP at home and let us know if it remains elevated.    5. CAROTID ARTERY DISEASE: Stable by dopplers 2/13. No changes. Repeat 2/14

## 2012-08-01 NOTE — Patient Instructions (Signed)
Your physician recommends that you schedule a follow-up appointment in:  4 weeks.  Your physician has requested that you have a lexiscan myoview. For further information please visit [email or web address] Please follow instruction sheet, as given.   Your physician recommends that you return for lab work  On day of Lexiscan  Your physician has requested that you have a carotid duplex. This test is an ultrasound of the carotid arteries in your neck. It looks at blood flow through these arteries that supply the brain with blood. Allow one hour for this exam. There are no restrictions or special instructions. To be done in February 2014.

## 2012-08-04 ENCOUNTER — Telehealth: Payer: Self-pay | Admitting: Cardiovascular Disease

## 2012-08-04 MED ORDER — NITROGLYCERIN 0.4 MG SL SUBL: 0.4000 mg | SUBLINGUAL_TABLET | SUBLINGUAL | Status: DC | PRN

## 2012-08-04 NOTE — Telephone Encounter (Signed)
Plz return call to pt regarding nitro RX, [phone]

## 2012-08-04 NOTE — Telephone Encounter (Signed)
Spoke with pt's husband who is requesting refill of pt's NTG to [facility] at [facility]. I told him I would send this in.

## 2012-08-13 ENCOUNTER — Ambulatory Visit (HOSPITAL_COMMUNITY): Payer: Medicare Other | Attending: Cardiovascular Disease | Admitting: Radiology

## 2012-08-13 ENCOUNTER — Other Ambulatory Visit (INDEPENDENT_AMBULATORY_CARE_PROVIDER_SITE_OTHER): Payer: Medicare Other

## 2012-08-13 VITALS — BP 167/92 | Ht 62.0 in | Wt 152.0 lb

## 2012-08-13 DIAGNOSIS — R000 Tachycardia, unspecified: Secondary | ICD-10-CM | POA: Insufficient documentation

## 2012-08-13 DIAGNOSIS — R0602 Shortness of breath: Secondary | ICD-10-CM | POA: Diagnosis not present

## 2012-08-13 DIAGNOSIS — Z8673 Personal history of transient ischemic attack (TIA), and cerebral infarction without residual deficits: Secondary | ICD-10-CM | POA: Diagnosis not present

## 2012-08-13 DIAGNOSIS — R002 Palpitations: Secondary | ICD-10-CM | POA: Diagnosis not present

## 2012-08-13 DIAGNOSIS — R5381 Other malaise: Secondary | ICD-10-CM | POA: Insufficient documentation

## 2012-08-13 DIAGNOSIS — R51 Headache: Secondary | ICD-10-CM

## 2012-08-13 DIAGNOSIS — I10 Essential (primary) hypertension: Secondary | ICD-10-CM | POA: Diagnosis not present

## 2012-08-13 DIAGNOSIS — I739 Peripheral vascular disease, unspecified: Secondary | ICD-10-CM | POA: Insufficient documentation

## 2012-08-13 DIAGNOSIS — E785 Hyperlipidemia, unspecified: Secondary | ICD-10-CM | POA: Diagnosis not present

## 2012-08-13 DIAGNOSIS — R0609 Other forms of dyspnea: Secondary | ICD-10-CM | POA: Insufficient documentation

## 2012-08-13 DIAGNOSIS — R61 Generalized hyperhidrosis: Secondary | ICD-10-CM | POA: Diagnosis not present

## 2012-08-13 DIAGNOSIS — Z87891 Personal history of nicotine dependence: Secondary | ICD-10-CM | POA: Diagnosis not present

## 2012-08-13 DIAGNOSIS — R42 Dizziness and giddiness: Secondary | ICD-10-CM | POA: Diagnosis not present

## 2012-08-13 DIAGNOSIS — R55 Syncope and collapse: Secondary | ICD-10-CM | POA: Diagnosis not present

## 2012-08-13 DIAGNOSIS — R110 Nausea: Secondary | ICD-10-CM | POA: Diagnosis not present

## 2012-08-13 DIAGNOSIS — I2510 Atherosclerotic heart disease of native coronary artery without angina pectoris: Secondary | ICD-10-CM | POA: Diagnosis not present

## 2012-08-13 DIAGNOSIS — Z01810 Encounter for preprocedural cardiovascular examination: Secondary | ICD-10-CM | POA: Insufficient documentation

## 2012-08-13 DIAGNOSIS — I779 Disorder of arteries and arterioles, unspecified: Secondary | ICD-10-CM | POA: Diagnosis not present

## 2012-08-13 DIAGNOSIS — I4891 Unspecified atrial fibrillation: Secondary | ICD-10-CM

## 2012-08-13 DIAGNOSIS — Z8249 Family history of ischemic heart disease and other diseases of the circulatory system: Secondary | ICD-10-CM | POA: Insufficient documentation

## 2012-08-13 DIAGNOSIS — I509 Heart failure, unspecified: Secondary | ICD-10-CM | POA: Diagnosis not present

## 2012-08-13 DIAGNOSIS — R0989 Other specified symptoms and signs involving the circulatory and respiratory systems: Secondary | ICD-10-CM | POA: Insufficient documentation

## 2012-08-13 LAB — CBC WITH DIFFERENTIAL/PLATELET
Basophils Relative: 0.7 % (ref 0.0–3.0)
Eosinophils Absolute: 0.1 10*3/uL (ref 0.0–0.7)
Eosinophils Relative: 2.1 % (ref 0.0–5.0)
Lymphocytes Relative: 53.4 % — ABNORMAL HIGH (ref 12.0–46.0)
MCHC: 30.4 g/dL (ref 30.0–36.0)
Neutrophils Relative %: 36.8 % — ABNORMAL LOW (ref 43.0–77.0)
RBC: 4.73 Mil/uL (ref 3.87–5.11)
WBC: 6.8 10*3/uL (ref 4.5–10.5)

## 2012-08-13 MED ORDER — TECHNETIUM TC 99M SESTAMIBI GENERIC - CARDIOLITE
10.0000 | Freq: Once | INTRAVENOUS | Status: AC | PRN
  Administered 2012-08-13: 10 via INTRAVENOUS

## 2012-08-13 MED ORDER — AMINOPHYLLINE 25 MG/ML IV SOLN
75.0000 mg | Freq: Once | INTRAVENOUS | Status: AC
  Administered 2012-08-13: 75 mg via INTRAVENOUS

## 2012-08-13 MED ORDER — TECHNETIUM TC 99M SESTAMIBI GENERIC - CARDIOLITE
30.0000 | Freq: Once | INTRAVENOUS | Status: AC | PRN
  Administered 2012-08-13: 30 via INTRAVENOUS

## 2012-08-13 MED ORDER — REGADENOSON 0.4 MG/5ML IV SOLN
0.4000 mg | Freq: Once | INTRAVENOUS | Status: AC
  Administered 2012-08-13: 0.4 mg via INTRAVENOUS

## 2012-08-13 NOTE — Progress Notes (Signed)
[facility] SITE 3 NUCLEAR MED [address] [id number] [place] [address] [phone]  Cardiology Nuclear Med Study  Latoya [name] [facility] is a 66 y.o. female     MRN : [id number]     DOB: 13-Apr-1946  Procedure Date: 08/13/2012  Nuclear Med Background Indication for Stress Test:  Evaluation for Ischemia and Pending Surgical Clearance: Back surgery- Dr. Maeola Harman History:  '03 Heart Catheterization-Nonobstructive CAD in LM, and LAD, Obstructed CFX, DX treat medically, EF=70% '10 Myocardial Perfusion Study-No ischemia, EF=51%, and '11 Echo: EF=55%, mild LVH, mild MR, history of PAF,CHF Cardiac Risk Factors: Carotid Disease, Family History - CAD, History of Smoking, Hypertension, Lipids, PVD and TIA  Symptoms:  Diaphoresis, Dizziness, DOE, Fatigue, Fatigue with Exertion, Light-Headedness, Nausea, Near Syncope, Palpitations and Rapid HR   Nuclear Pre-Procedure Caffeine/Decaff Intake:  None NPO After: 7:00pm   Lungs:  clear O2 Sat: 98% on room air. IV 0.9% NS with Angio Cath:  22g  IV Site: L Antecubital  IV Started by:  Stanton Kidney, EMT-P  Chest Size (in):  36 Cup Size: B  Height: 5\' 2"  (1.575 m)  Weight:  152 lb (68.947 kg)  BMI:  Body mass index is 27.80 kg/(m^2). Tech Comments:  NA    Nuclear Med Study 1 or 2 day study: 1 day  Stress Test Type:  Lexiscan  Reading MD: Charlton Haws, MD  Order Authorizing Provider:  Verne Carrow, MD  Resting Radionuclide: Technetium 4m Sestamibi  Resting Radionuclide Dose: 10.9 mCi   Stress Radionuclide:  Technetium 37m Sestamibi  Stress Radionuclide Dose: 33.0 mCi           Stress Protocol Rest HR: 96 Stress HR: 113  Rest BP: 167/92 Stress BP: 150/84  Exercise Time (min): n/a METS: n/a   Predicted Max HR: 155 bpm % Max HR: 72.9 bpm Rate Pressure Product: 62952   Dose of Adenosine (mg):  n/a Dose of Lexiscan: 0.4 mg, Aminophylline 75 mg IVP   Dose of Atropine (mg): n/a Dose of Dobutamine: n/a mcg/kg/min  (at max HR)  Stress Test Technologist: Irean Hong, RN  Nuclear Technologist:  Domenic Polite, CNMT     Rest Procedure:  Myocardial perfusion imaging was performed at rest 45 minutes following the intravenous administration of Technetium 60m Sestamibi. Rest ECG: NSR  Stress Procedure:  The patient received IV Lexiscan 0.4 mg over 15-seconds.  Technetium 62m Sestamibi injected at 30-seconds.  There were significant changes with Lexiscan. The patient complained of chest pain with Lexiscan. Quantitative spect images were obtained after a 45 minute delay. The patient complained of persistent headache with flushing of face and neck after Lexiscan that was relieved quickly after Aminophylline 75 mg IVP given.   Stress ECG: No significant change from baseline ECG  QPS Raw Data Images:  Patient motion noted. Stress Images:  lateral wall not interpretable due to bowel artifact Rest Images:  lateral wall not interpretable due to bowel artifact Subtraction (SDS):  Normal Transient Ischemic Dilatation (Normal <1.22):  1.03 Lung/Heart Ratio (Normal <0.45):  0.45  Quantitative Gated Spect Images QGS EDV:  95 ml QGS ESV:  50 ml  Impression Exercise Capacity:  Lexiscan with no exercise. BP Response:  Normal blood pressure response. Clinical Symptoms:  Headache requiring amynophylline ECG Impression:  No significant ST segment change suggestive of ischemia. Comparison with Prior Nuclear Study: No images to compare  Overall Impression:  Apical thinning no obvious ischemia Lateral wall not interpretable due to a large area of bowel  artifact on both resting and stress images  LV Ejection Fraction: 47%.  LV Wall Motion:  Apical hypokinesis   Charlton Haws

## 2012-08-14 ENCOUNTER — Other Ambulatory Visit: Payer: Self-pay | Admitting: *Deleted

## 2012-08-14 DIAGNOSIS — I2510 Atherosclerotic heart disease of native coronary artery without angina pectoris: Secondary | ICD-10-CM

## 2012-08-15 ENCOUNTER — Other Ambulatory Visit (INDEPENDENT_AMBULATORY_CARE_PROVIDER_SITE_OTHER): Payer: Medicare Other

## 2012-08-15 DIAGNOSIS — I2510 Atherosclerotic heart disease of native coronary artery without angina pectoris: Secondary | ICD-10-CM | POA: Diagnosis not present

## 2012-08-15 DIAGNOSIS — Z23 Encounter for immunization: Secondary | ICD-10-CM | POA: Diagnosis not present

## 2012-08-15 LAB — CBC WITH DIFFERENTIAL/PLATELET
Basophils Absolute: 0.1 10*3/uL (ref 0.0–0.1)
Eosinophils Absolute: 0.2 10*3/uL (ref 0.0–0.7)
HCT: 32.8 % — ABNORMAL LOW (ref 36.0–46.0)
Hemoglobin: 10.2 g/dL — ABNORMAL LOW (ref 12.0–15.0)
Lymphs Abs: 3.8 10*3/uL (ref 0.7–4.0)
MCHC: 31.1 g/dL (ref 30.0–36.0)
Monocytes Absolute: 0.7 10*3/uL (ref 0.1–1.0)
Neutro Abs: 2.8 10*3/uL (ref 1.4–7.7)
RDW: 21.6 % — ABNORMAL HIGH (ref 11.5–14.6)

## 2012-08-20 ENCOUNTER — Telehealth: Payer: Self-pay | Admitting: Cardiovascular Disease

## 2012-08-20 DIAGNOSIS — D509 Iron deficiency anemia, unspecified: Secondary | ICD-10-CM | POA: Diagnosis not present

## 2012-08-20 NOTE — Telephone Encounter (Signed)
Pt rtn call from pat from yesterday, pls call

## 2012-08-20 NOTE — Telephone Encounter (Signed)
Pt called with cbc results, copies made and placed at front desk to pick up.

## 2012-08-25 ENCOUNTER — Ambulatory Visit: Payer: Medicare Other | Admitting: Cardiovascular Disease

## 2012-08-25 NOTE — Telephone Encounter (Signed)
New problem:  Unable to come to appt today. C/O not feeling well.

## 2012-08-25 NOTE — Telephone Encounter (Signed)
Latoya [name], I can write a clearance letter for her. Who is her back surgeon?

## 2012-08-25 NOTE — Telephone Encounter (Signed)
Spoke with pt. She states she has been feeling sick all weekend. Having trouble with allergies and asthma.  She is unable to be here for appt with Dr. Clifton James today. Appt was follow up to stress test. She is planning to have upcoming surgical procedure.  Will check with Dr. Clifton James to see if pt can be cleared based on stress test results

## 2012-08-25 NOTE — Telephone Encounter (Signed)
Spoke with pt and gave her this information. Her surgeon is Dr. [name].  She will follow up with Dr. Clifton James in 6 months.

## 2012-08-25 NOTE — Telephone Encounter (Signed)
Left message to call back  

## 2012-08-26 ENCOUNTER — Encounter: Payer: Self-pay | Admitting: Cardiovascular Disease

## 2012-08-26 DIAGNOSIS — I2510 Atherosclerotic heart disease of native coronary artery without angina pectoris: Secondary | ICD-10-CM | POA: Diagnosis not present

## 2012-08-26 DIAGNOSIS — J309 Allergic rhinitis, unspecified: Secondary | ICD-10-CM | POA: Diagnosis not present

## 2012-08-26 DIAGNOSIS — E785 Hyperlipidemia, unspecified: Secondary | ICD-10-CM | POA: Diagnosis not present

## 2012-08-26 DIAGNOSIS — R7301 Impaired fasting glucose: Secondary | ICD-10-CM | POA: Diagnosis not present

## 2012-08-26 DIAGNOSIS — E039 Hypothyroidism, unspecified: Secondary | ICD-10-CM | POA: Diagnosis not present

## 2012-08-27 DIAGNOSIS — IMO0002 Reserved for concepts with insufficient information to code with codable children: Secondary | ICD-10-CM | POA: Diagnosis not present

## 2012-08-28 ENCOUNTER — Ambulatory Visit: Payer: Medicare Other | Admitting: Cardiovascular Disease

## 2012-09-01 ENCOUNTER — Other Ambulatory Visit: Payer: Self-pay | Admitting: Neurosurgery

## 2012-09-04 DIAGNOSIS — K449 Diaphragmatic hernia without obstruction or gangrene: Secondary | ICD-10-CM | POA: Diagnosis not present

## 2012-09-04 DIAGNOSIS — D509 Iron deficiency anemia, unspecified: Secondary | ICD-10-CM | POA: Diagnosis not present

## 2012-09-19 NOTE — Pre-Procedure Instructions (Addendum)
20 Latoya [name]  09/19/2012   Your procedure is scheduled on: Tuesday, December 10th   Report to [facility] at 5:30 AM.  Call this number if you have problems the morning of surgery: [phone]   Remember:   Do not eat food OR drink any liquids :After Midnight Monday.  Discontinue taking aspirin, plavix, coumadin, effient and herbal medications 4-5 days prior to surgery  Take these medicines the morning of surgery with A SIP OF WATER: Albuterol Nebulizer, Astelin Spray or               Nasacort, Qvar, Valium, Nexium, Pain Medication, Levothyroxine, Metoprolol   Do not wear jewelry, make-up or nail polish.  Do not wear lotions, powders, or perfumes. You may NOT wear deodorant.  Do not shave 48 hours prior to surgery.    Do not bring valuables to the hospital.  Contacts, dentures or bridgework may NOT be worn into surgery.   Leave suitcase in the car. After surgery it may be brought to your room.  For patients admitted to the hospital, checkout time is 11:00 AM the day of discharge.   Patients discharged the day of surgery will not be allowed to drive home.   Name and phone number of your driver:    Special Instructions: Shower using CHG 2 nights before surgery and the night before surgery.  If you shower the day of surgery use CHG.  Use special wash - you have one bottle of CHG for all showers.  You should use approximately 1/3 of the bottle for each shower.   Please read over the following fact sheets that you were given: Pain Booklet, Coughing and Deep Breathing, Blood Transfusion Information, MRSA Information and Surgical Site Infection Prevention

## 2012-09-22 ENCOUNTER — Encounter (HOSPITAL_COMMUNITY)
Admission: RE | Admit: 2012-09-22 | Discharge: 2012-09-22 | Payer: Medicare Other | Source: Ambulatory Visit | Attending: Neurosurgery | Admitting: Neurosurgery

## 2012-09-22 DIAGNOSIS — G894 Chronic pain syndrome: Secondary | ICD-10-CM | POA: Diagnosis not present

## 2012-09-22 DIAGNOSIS — M47817 Spondylosis without myelopathy or radiculopathy, lumbosacral region: Secondary | ICD-10-CM | POA: Diagnosis not present

## 2012-09-22 DIAGNOSIS — M4000 Postural kyphosis, site unspecified: Secondary | ICD-10-CM | POA: Diagnosis not present

## 2012-09-22 DIAGNOSIS — M4120 Other idiopathic scoliosis, site unspecified: Secondary | ICD-10-CM | POA: Diagnosis not present

## 2012-09-24 ENCOUNTER — Telehealth: Payer: Self-pay | Admitting: Cardiovascular Disease

## 2012-09-24 DIAGNOSIS — Z1231 Encounter for screening mammogram for malignant neoplasm of breast: Secondary | ICD-10-CM | POA: Diagnosis not present

## 2012-09-24 NOTE — Telephone Encounter (Signed)
plz return call to patient [phone] or [phone] cell to discuss upcoming procedure

## 2012-09-24 NOTE — Telephone Encounter (Signed)
Spoke with pt. She is being admitted to [facility] for spinal surgery on December 10,2013. Surgeon is Dr. [name].  Pt is requesting Dr. Clifton James to follow her for her cardiac issues while she is in the hospital.  She will be in neuro ICU and thinks she will be in the hospital for 1 week.

## 2012-09-25 NOTE — Telephone Encounter (Signed)
Thanks, chris 

## 2012-09-26 ENCOUNTER — Encounter (HOSPITAL_COMMUNITY): Payer: Self-pay

## 2012-09-26 ENCOUNTER — Encounter (HOSPITAL_COMMUNITY)
Admission: RE | Admit: 2012-09-26 | Discharge: 2012-09-26 | Disposition: A | Discharge status: Home / Self Care | Payer: Medicare Other | Source: Ambulatory Visit | Attending: Neurosurgery | Admitting: Neurosurgery

## 2012-09-26 ENCOUNTER — Encounter (HOSPITAL_COMMUNITY)
Admission: RE | Admit: 2012-09-26 | Discharge: 2012-09-26 | Disposition: A | Discharge status: Home / Self Care | Payer: Medicare Other | Source: Ambulatory Visit | Attending: Anesthesiology | Admitting: Anesthesiology

## 2012-09-26 DIAGNOSIS — J45909 Unspecified asthma, uncomplicated: Secondary | ICD-10-CM | POA: Diagnosis not present

## 2012-09-26 DIAGNOSIS — Z01818 Encounter for other preprocedural examination: Secondary | ICD-10-CM | POA: Diagnosis not present

## 2012-09-26 HISTORY — DX: Other melanin hyperpigmentation: L81.4

## 2012-09-26 HISTORY — DX: Malignant (primary) neoplasm, unspecified: C80.1

## 2012-09-26 HISTORY — DX: Personal history of other diseases of the digestive system: Z87.19

## 2012-09-26 HISTORY — DX: Other complications of anesthesia, initial encounter: T88.59XA

## 2012-09-26 HISTORY — DX: Adverse effect of unspecified anesthetic, initial encounter: T41.45XA

## 2012-09-26 HISTORY — DX: Bladder disorder, unspecified: N32.9

## 2012-09-26 HISTORY — DX: Hypothyroidism, unspecified: E03.9

## 2012-09-26 LAB — BASIC METABOLIC PANEL
BUN: 15 mg/dL (ref 6–23)
Calcium: 9.5 mg/dL (ref 8.4–10.5)
Creatinine, Ser: 0.51 mg/dL (ref 0.50–1.10)
GFR calc Af Amer: >90 mL/min (ref >90)
GFR calc non Af Amer: >90 mL/min (ref >90)

## 2012-09-26 LAB — CBC
MCHC: 31.4 g/dL (ref 30.0–36.0)
Platelets: 234 10*3/uL (ref 150–400)
RDW: 20.3 % — ABNORMAL HIGH (ref 11.5–15.5)

## 2012-09-26 LAB — TYPE AND SCREEN
ABO/RH(D): O NEG
Antibody Screen: NEGATIVE

## 2012-09-26 NOTE — Pre-Procedure Instructions (Signed)
20 Latoya [name]  09/26/2012   Your procedure is scheduled on:  Tuesday September 30, 2012  Report to [facility] at 5:30AM.  Call this number if you have problems the morning of surgery: [phone]   Remember:   Do not eat food or drink :After Midnight.    Take these medicines the morning of surgery with A SIP OF WATER: albuterol ( if needed, can bring day of surgery), Qvar, flexeril (if needed), nexium, fentanyl patch, gabapentin, dilaudid (if needed for pain), synthroid, metoprolol   Do not wear jewelry, make-up or nail polish.  Do not wear lotions, powders, or perfumes.  Do not shave 48 hours prior to surgery.  Do not bring valuables to the hospital.  Contacts, dentures or bridgework may not be worn into surgery.  Leave suitcase in the car. After surgery it may be brought to your room.  For patients admitted to the hospital, checkout time is 11:00 AM the day of discharge.   Patients discharged the day of surgery will not be allowed to drive home.  Name and phone number of your driver: family /friend  Special Instructions: Shower using CHG 2 nights before surgery and the night before surgery.  If you shower the day of surgery use CHG.  Use special wash - you have one bottle of CHG for all showers.  You should use approximately 1/3 of the bottle for each shower.   Please read over the following fact sheets that you were given: Pain Booklet, Coughing and Deep Breathing, Blood Transfusion Information, MRSA Information and Surgical Site Infection Prevention

## 2012-09-26 NOTE — Progress Notes (Signed)
Call to A. Zelenak,PAC, reported pt.account of cardiac arrest under general in 1971. Will leave her chart for further review.

## 2012-09-29 MED ORDER — VANCOMYCIN HCL IN DEXTROSE 1-5 GM/200ML-% IV SOLN
1000.0000 mg | INTRAVENOUS | Status: AC
  Administered 2012-09-30: 1000 mg via INTRAVENOUS
  Filled 2012-09-29: qty 200

## 2012-09-29 NOTE — Consult Note (Signed)
Anesthesia chart review: Patient is a 66 year old female scheduled for revision of L5-S1 fusion with iliac screws by Dr. [name] on 09/30/2012.  History includes former smoker, asthma, antiphospholipid syndrome with history of PE/DVT (previously treated with Coumadin with allergies to heparin and Lovenox), paroxysmal atrial fibrillation, hyperlipidemia, CAD, iron deficiency anemia, anxiety, GERD, hiatal hernia, Raynaud's phenomenon, hypothyroidism, hypertension, spina bifida repair, history of cleft lip and palate repair, hysterectomy, tonsillectomy, prior joint, back, and breast surgeries.  She reported an anesthesia history of "cardiac arrest" during scalenotomy in 82 at the age of 66 years old.  She has since undergone multiple operative procedures.  Her Cardiologist is Dr. Clifton James.  Patient had a recent stress test (see below) showing no obvious ischemia, and he felt she was okay to proceed with back surgery without additional cardiac work-up.  EKG on 08/01/12 showed ST @ 102 bpm.  Nuclear stress test on 08/13/12 showed: Apical thinning no obvious ischemia Lateral wall not interpretable due to a large area of bowel artifact on both resting and stress images LV Ejection Fraction: 47%. LV Wall Motion: Apical hypokinesis.  Echo on 11/23/09 showed: - Left ventricle: The cavity size was normal. Wall thickness was increased in a pattern of mild LVH. Systolic function was normal. The estimated ejection fraction was in the range of 55% to 60%. - Mitral valve: Moderately thickened and calcified leaflet tips Mild regurgitation. - Left atrium: The atrium was mildly dilated. - Atrial septum: No defect or patent foramen ovale was identified. - Tricuspid valve: Mild regurgitation.   Cardiac cath on 09/22/02 showed: EF 70%, left main 30% at the ostium, mid LAD 40-50 beyond the diagonal takeoff,diagonal 80% at the ostium extending into the mid portion, large circumflex 70-80% prior to the bifurcation of the  marginal, RCA with mild luminal irregularities.  According to Dr. Gibson Ramp 08/01/12 office note, "There was discussion about bypass but ultimately medical management was pursued."  Carotid duplex on 12/12/2011 to 40-59% bilateral ICA stenosis. One year follow-up was recommended.  Chest x-ray on 09/26/2012 showed no acute findings.  Preoperative labs noted.  Patient has been cleared by her cardiologist. If no significant change in her status then anticipate she can proceed as planned.  Her consent indicates Arixtra will be used post-operatively for DVT prophylaxis.  Shonna Chock, PA-C 09/29/12 1126

## 2012-09-29 NOTE — H&P (Signed)
Latoya [name] #[id number] DOB:  01-07-1946  08/27/2012:  Latoya [name] comes in today. She is having more pain and wants to go ahead with surgery.  This will consist of placement of iliac screws and extension of lumbosacral function. She will be fitted through [employer] with a Clamshell brace.  We will plan on putting her in the ICU postoperatively.  The surgery is scheduled for 09/30/2012, which will be performed for revision of L5-S1 pseudoarthrosis.  She will be prescribed Arixtra postoperatively and will be observed in the ICU postoperatively.  Her cardiologist and other physicians feel that it is safe from a medical standpoint to proceed with surgery.    She has been recently diagnosed with anemia from her primary physician and is going to have a GI evaluation with endoscopy planned for early next week.  Cardiology did a stress test two weeks ago which was "okay".  Dr. Anabel Bene plans to see her in January.  She had an allergy flare-up over the weekend and had a Depo-Medrol injection yesterday.  She is off Arthrotec for the last month and is noticing worsening problems with urinary incontinence.    Risks and benefits were discussed in detail with the patient and her husband, and she wishes to proceed with revision of posterior fusion.           Danae Orleans. [name], M.D./aft   Ewing Schlein. Font  #[id number]  DOB:  1946-05-26   06/02/2012:  Latoya [name] returns today.  I reviewed her CT scan of her cervical, thoracic and lumbar spine.  She says that her biggest problem now relates to her lumbar spine.  She does appear to have stable cervical findings with some degenerative changes most marked at the C4-5 level.  There does appear to be left-sided facet arthropathy and moderately severe foraminal stenosis on the left which I believe is the basis for her left-sided neck pain.  The most marked findings, however, relate to her lumbar CT scan which shows that she has a nonunion at the L5-S1 levels with lucency around the  anterior spacer and plate at the Q6-V7 level and around the S1 screws.    She says that her back and leg pain are now intolerable and are very limiting to her.  Where as we had previously discussed revision of the fusion with iliac screws and we had not proceed with that, at this point, I believe that is our only alternative from a surgical standpoint and given the severity of her pain complaints, at this point, that will be most appropriate if she is not able to tolerate her current level of discomfort.  She does wish to proceed with that.  We will fit her for a Clam Shell TLSO brace.  She will also need to go ahead with a new bone growth stimulator and she wishes to proceed with this surgery in the relatively near future and we will set that up.  Risks and benefits were discussed in great detail with the patient and her husband who wish to proceed.          Danae Orleans. [name], M.D./sv   Ewing Schlein. Tousley  #[id number] DOB:  Jun 16, 1946  01/02/2012:  Latoya [name] returns today. She says that she has had a cardiac workup which showed 50% plaque of both carotids and that this will be monitored. She continues to complain of left sciatica and says this remains a problem and she wonders whether her spine healed completely or not.  She says she has persistent arthritic pain.  She wonders if the bone growth stimulator that she had been using has been effective and she is 80% sure of her upcoming move and thinks they will move to [place]. She has been using Arthrotek 75 mg. twice daily, Duragesic patch 100 mcg. every two days, Gabapentin 600 mg. q.h.s., Skelaxin 800 mg. half to one as needed, Valium 5 mg. at night.    At this point, [name] is quite concerned about whether or not her surgery has healed. I think this is a legitimate concern and she wants to have some assurance as to the status of her spine after previous surgery. To this end, I think given the amount of hardware she has in her back that a myelogram  would be most appropriate and to this end I have recommended that we proceed with a total myelogram. We will look at her previous cervical surgery, her thoracic spine, as well as her lumbosacral reconstructive surgery.  She will followup with me after that to review.          Danae Orleans. [name], M.D./aft  Ewing Schlein. [place]  09/16/96:  Latoya [name] returns today at the request of Dr. Rosalie Doctor for assessment for a possible Ray cage fusion of her lumbar spine.  She has previously undergone a lumbar discectomy at L4-5 on the right by Dr. Darrelyn Hillock in 1993.  She now complains of low back and right leg pain, which she says is quite bothersome to her.  Dr. Roxan Hockey performed a lumbar myelogram, which shows some fibrosis around the L5 nerve root on the right with a prior laminectomy defect at L4-5 on the right. She also has a foraminal disc herniation at L3-4, which effaces the subarachnoid space around the L4 nerve root, but does not frankly compress it.    Latoya [name] says that she is quite bothered by back and predominantly right leg pain and that this has been bothering her since July.  She has not undergone a formal course of physical therapy.  She has not been taking an anti-inflammatories, but is unable to do so because of significant gastric erosions following the use of Naprosyn.    On examination, Latoya [name] has full lower extremity strength with the exception of 5-/5 dorsiflexion on the right and 4+/5 extensor hallucis longus on the right.  Reflexes are symmetric at the knees and ankles.  The great toes are downgoing to plantar stimulation. She has a positive seated straight leg raise on the right. She notes some numbness in her right great toe.  I reviewed Latoya [name]' myelogram with her and discussed treatment options with her and her husband.  I strongly recommended against lumbar fusion at this point as this would need to be performed at L3-4 and L4-5 levels, and this seems to be quite a lot of  surgery for fairly moderate pain at this point.  I have suggested that she undergo a course of physical therapy to see whether she can obtain any relieve.  I also discussed the possibility of her trying Elavil to see whether this will help her with nighttime discomfort.  I will see her back in one month and see how she is doing at that time, and also discussed the possibility of selective nerve root blocks to see whether we can eliminate some of her pain in this fashion, and also to assess which is the level in her spine which is causing her the most difficulty.  This can be performed at the L4 and L5 nerve roots on the right.  I will see her back in one month and make further recommendations at that time.        Danae Orleans. [name], M.D./aft   NEUROSURGICAL CONSULTATION   [facility]ynn Hessler [facility]        August 04, 1996  HISTORY:     The patient is a 66 year old right-handed female seen in neurosurgical consultation because of low back and right leg pain.  The patient has apparently had bone density evaluations done at [facility] on 07-09-96; apparently there is no evidence of osteoporosis.  She had an MRI scan of her lumbar spine done at the [facility] facility on 06-30-96.  The report indicates the patient has six nonrib bearing lumbar type vertebra, apparently labeling them as T12 through L5.  The last open space was the L5-S1 level.  There are reported to have been post-operative changes at L4-5 associated with some spondylitic abnormalities involving the facet joints and interspace, but no evidence of a recurrent disc.  The conus medullaris was at the T12-L1 level, and there was also noted some degeneration of the L3-4 disc along with facet arthritic changes.  The patient apparently initially had operative intervention on her back by Dr. Worthy Rancher in February of 1993 (assisted by Dr. Myrtis Hopping); she apparently had a free fragment of herniated disc.  The patient states that it took about two years to fully  resolve her pain syndrome.  She was evaluated in 1994 for a possible thoracic disc and an MRI was negative.  In 1994, she had a follow-up MRI scan of her lumbar spine that did not show a significant abnormality.  Dr. Darrelyn Hillock was concerned at that point that she might be having some symptoms related to silicone implants and she was seen by Dr. Charolotte Eke who did not feel that the problems were related.  In December of 1994, she was seen by Dr. Darrelyn Hillock with marked weakness of plantar flexion on the right.  An MRI scan done at the [facility] facility was again reported not to show any recurrent disc.  She was treated in June of 1996 for greater trochanteric bursitis by Dr. Darrelyn Hillock.  The patient states that she has continued to have some chronic pain problems, but in July of 1997 was lifting an object and developed increasing difficulties with pain down her right leg and into the great and second toe.  She was seen by Dr. Tad Moore and was on a Cortisone dosepak. He ordered the MRI scan and bone density studies as     noted above.  She about two weeks of bed rest and as long as she is inactive seems to improve.  She becomes worse as soon as she returns to her usual daily routine.  About 2:00 PM of the day she is in fairly severe pain.  She is comfortably lying on her left side with her right side up and her legs curled up on a pillow.  She is not having any left leg discomfort.  The right leg pain goes all the way into the foot as noted above.  There is a very strong famliy history of lumbar disc disease.  One of her brothers is currently under the care of Dr. Renae Fickle and had surgery about a year ago, and now has a recurrent disc at the operative site and in addition has a bulging disc at another level.  PAST MEDICAL HISTORY:  The patient  had surgery to repair a cleft lip and palate, and an opening between her buttocks between birth and age [age].  She is ALLERGIC TO BETADINE AND INDOCIN.  She does not smoke or drink.   Her weight has been stable at 172-lbs and she is 5'6 " tall. Her current medications include Dolobid 500-mg po bid, Prilosec one per day, and she takes a partial Endocet tablet intermittently.  She has also been on Valium intermittently for muscle spasm.  PERSONAL HISTORY:   She is married and was accompanied to the office by her husband who was present during discussions of her illness.  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   FAMILY HISTORY:   Her mother is age 25 years and has glaucoma and diabetes.  Her father died in his 35s with a massive myocardial infarction.  Diabetes and hypertension run in her family.  REVIEW OF SYSTEMS:   The patient has had trouble with a hiatal hernia and gastric erosion; otherwise, negative.  PHYSICAL EXAMINATION:  General:  The patient is a well-developed, well-nourished female.  HEENT negative.  Neck supple; good range of motion without Lhermitte's phenomenon or radicular pain.  No increase in back discomfort with extremes of flexion or extension. Lungs clear.  Heart sinus rhythm, no murmurs.   Abdomen soft, nontender.  Extremities - see below; there are no acute ischemic changes and no evidence of thrombophlebitis.  NEUROLOGICAL EXAMINATION: The patient's mental status is normal.  PEERLA.  Extraocular movements are full without nystagmus or diplopia.  The discs are flat.  There is no facial paresis.  The right nasolabial fold is slightly diminished compared to the left at rest, but is symmetrical with smiling.  The patient ambulates well.  She has a tendency not to bear full weight on her right leg either with heel or toe walking.  She can go up a single step with either leg first.  She can stand on the toes of either foot with the other leg off the ground; this seems slightly more difficult on the right than the left.  Forward bending is done to better than 90 degrees.  Back extension is negative.  Her lumbar incision is well  healed.  There is no paravertebral spasm.  She has an opening between the buttocks that seems most compatible with a pilonidal cyst tract to me.  Strength is normal in her upper and lower extremities.  Joint position is intact.  She perceives the pin well, although she feels that her great toe has some subjective numbness.  Deep tendon reflexes:  On the right, brachioradialis 1-2+, biceps 1-2+, triceps 1-2+, knee jerk 2+, ankle jerk 0.  On the left, brachioradialis 1-2+, biceps 1-2+, triceps 1-2+, knee jerk 1-2+, ankle jerk 0.  Babinski responses are plantar bilaterally.  There is no ankle clonus.  Straight leg raising is done to almost 90 degrees bilaterally.  Hip rotation and femoral stretch maneuvers are negative bilaterally.  CLINICAL IMPRESSION: 1. Low back and right leg pain, etiology unknown; post lumbar hemilaminectomy and excision of HNP, 1993, Dr. Worthy Rancher. 2. ALLERGIC TO BETADINE AND INDOCIN. 3. History of hiatal hernia and gastritis.  RECOMMENDATIONS:   The clinical problem was reviewed in detail with the patient and her husband.  I am going to get a complete set of lumbar spine x-rays with flexion and extension views and they are going to obtain the MRI scan of her lumbar spine and bring it by  the office for me to review.  They are going to call me on Monday of next week for further recommendations.     [facility]    Alanson Aly. Roxan Hockey, M.D.

## 2012-09-30 ENCOUNTER — Encounter (HOSPITAL_COMMUNITY)
Admission: RE | Disposition: A | Discharge status: Home / Self Care | Payer: Self-pay | Source: Ambulatory Visit | Attending: Neurosurgery

## 2012-09-30 ENCOUNTER — Encounter (HOSPITAL_COMMUNITY): Payer: Self-pay | Admitting: Vascular Surgery

## 2012-09-30 ENCOUNTER — Encounter (HOSPITAL_COMMUNITY): Payer: Self-pay | Admitting: *Deleted

## 2012-09-30 ENCOUNTER — Ambulatory Visit (HOSPITAL_COMMUNITY): Payer: Medicare Other | Admitting: Vascular Surgery

## 2012-09-30 ENCOUNTER — Inpatient Hospital Stay (HOSPITAL_COMMUNITY)
Admission: RE | Admit: 2012-09-30 | Discharge: 2012-10-06 | DRG: 460 | Disposition: A | Discharge status: Home / Self Care | Payer: Medicare Other | Source: Ambulatory Visit | Attending: Neurosurgery | Admitting: Neurosurgery

## 2012-09-30 ENCOUNTER — Ambulatory Visit (HOSPITAL_COMMUNITY): Payer: Medicare Other

## 2012-09-30 DIAGNOSIS — M47817 Spondylosis without myelopathy or radiculopathy, lumbosacral region: Secondary | ICD-10-CM | POA: Diagnosis not present

## 2012-09-30 DIAGNOSIS — I4891 Unspecified atrial fibrillation: Secondary | ICD-10-CM | POA: Diagnosis present

## 2012-09-30 DIAGNOSIS — K449 Diaphragmatic hernia without obstruction or gangrene: Secondary | ICD-10-CM | POA: Diagnosis present

## 2012-09-30 DIAGNOSIS — IMO0002 Reserved for concepts with insufficient information to code with codable children: Secondary | ICD-10-CM | POA: Diagnosis not present

## 2012-09-30 DIAGNOSIS — I10 Essential (primary) hypertension: Secondary | ICD-10-CM | POA: Diagnosis present

## 2012-09-30 DIAGNOSIS — J45909 Unspecified asthma, uncomplicated: Secondary | ICD-10-CM | POA: Diagnosis present

## 2012-09-30 DIAGNOSIS — T84498A Other mechanical complication of other internal orthopedic devices, implants and grafts, initial encounter: Principal | ICD-10-CM | POA: Diagnosis present

## 2012-09-30 DIAGNOSIS — K219 Gastro-esophageal reflux disease without esophagitis: Secondary | ICD-10-CM | POA: Diagnosis present

## 2012-09-30 DIAGNOSIS — Y831 Surgical operation with implant of artificial internal device as the cause of abnormal reaction of the patient, or of later complication, without mention of misadventure at the time of the procedure: Secondary | ICD-10-CM | POA: Diagnosis present

## 2012-09-30 DIAGNOSIS — F411 Generalized anxiety disorder: Secondary | ICD-10-CM | POA: Diagnosis present

## 2012-09-30 DIAGNOSIS — E039 Hypothyroidism, unspecified: Secondary | ICD-10-CM | POA: Diagnosis present

## 2012-09-30 DIAGNOSIS — M4120 Other idiopathic scoliosis, site unspecified: Secondary | ICD-10-CM | POA: Diagnosis not present

## 2012-09-30 DIAGNOSIS — I739 Peripheral vascular disease, unspecified: Secondary | ICD-10-CM | POA: Diagnosis present

## 2012-09-30 DIAGNOSIS — I2510 Atherosclerotic heart disease of native coronary artery without angina pectoris: Secondary | ICD-10-CM | POA: Diagnosis present

## 2012-09-30 DIAGNOSIS — Z4789 Encounter for other orthopedic aftercare: Secondary | ICD-10-CM | POA: Diagnosis not present

## 2012-09-30 DIAGNOSIS — M545 Low back pain: Secondary | ICD-10-CM | POA: Diagnosis not present

## 2012-09-30 SURGERY — POSTERIOR LUMBAR FUSION 1 LEVEL
Anesthesia: General | Site: Back | Wound class: Clean

## 2012-09-30 MED ORDER — ADULT MULTIVITAMIN W/MINERALS CH
1.0000 | ORAL_TABLET | Freq: Every day | ORAL | Status: DC
  Administered 2012-10-01 – 2012-10-06 (×6): 1 via ORAL
  Filled 2012-09-30 (×7): qty 1

## 2012-09-30 MED ORDER — SODIUM CHLORIDE 0.9 % IJ SOLN
3.0000 mL | Freq: Two times a day (BID) | INTRAMUSCULAR | Status: DC
  Administered 2012-09-30 – 2012-10-05 (×5): 3 mL via INTRAVENOUS

## 2012-09-30 MED ORDER — ASPIRIN EC 81 MG PO TBEC
81.0000 mg | DELAYED_RELEASE_TABLET | Freq: Every day | ORAL | Status: DC
  Administered 2012-10-01 – 2012-10-06 (×6): 81 mg via ORAL
  Filled 2012-09-30 (×7): qty 1

## 2012-09-30 MED ORDER — MIDAZOLAM HCL 5 MG/5ML IJ SOLN
INTRAMUSCULAR | Status: DC | PRN
  Administered 2012-09-30: 2 mg via INTRAVENOUS

## 2012-09-30 MED ORDER — HYDROMORPHONE HCL PF 1 MG/ML IJ SOLN
0.2500 mg | INTRAMUSCULAR | Status: DC | PRN
  Administered 2012-09-30 (×6): 0.5 mg via INTRAVENOUS

## 2012-09-30 MED ORDER — FLUTICASONE PROPIONATE HFA 44 MCG/ACT IN AERO
1.0000 | INHALATION_SPRAY | Freq: Two times a day (BID) | RESPIRATORY_TRACT | Status: DC
  Administered 2012-09-30 – 2012-10-06 (×11): 1 via RESPIRATORY_TRACT
  Filled 2012-09-30: qty 10.6

## 2012-09-30 MED ORDER — LACTATED RINGERS IV SOLN
INTRAVENOUS | Status: DC | PRN
  Administered 2012-09-30 (×3): via INTRAVENOUS

## 2012-09-30 MED ORDER — PHENYLEPHRINE HCL 10 MG/ML IJ SOLN
INTRAMUSCULAR | Status: DC | PRN
  Administered 2012-09-30: 80 ug via INTRAVENOUS
  Administered 2012-09-30: 120 ug via INTRAVENOUS
  Administered 2012-09-30: 40 ug via INTRAVENOUS

## 2012-09-30 MED ORDER — MENTHOL 3 MG MT LOZG
1.0000 | LOZENGE | OROMUCOSAL | Status: DC | PRN
  Administered 2012-09-30: 3 mg via ORAL
  Filled 2012-09-30: qty 9

## 2012-09-30 MED ORDER — THROMBIN 20000 UNITS EX KIT
PACK | CUTANEOUS | Status: DC | PRN
  Administered 2012-09-30: 20000 [IU] via TOPICAL

## 2012-09-30 MED ORDER — LEVOTHYROXINE SODIUM 75 MCG PO TABS
75.0000 ug | ORAL_TABLET | Freq: Every day | ORAL | Status: DC
  Administered 2012-10-01 – 2012-10-06 (×6): 75 ug via ORAL
  Filled 2012-09-30 (×9): qty 1

## 2012-09-30 MED ORDER — ACETAMINOPHEN 650 MG RE SUPP: 650.0000 mg | RECTAL | Status: DC | PRN

## 2012-09-30 MED ORDER — AZELASTINE HCL 0.1 % NA SOLN
1.0000 | Freq: Two times a day (BID) | NASAL | Status: DC | PRN
  Administered 2012-10-03 – 2012-10-05 (×2): 1 via NASAL
  Filled 2012-09-30: qty 30

## 2012-09-30 MED ORDER — LORATADINE 10 MG PO TABS
10.0000 mg | ORAL_TABLET | Freq: Every day | ORAL | Status: DC
  Administered 2012-09-30 – 2012-10-06 (×7): 10 mg via ORAL
  Filled 2012-09-30 (×7): qty 1

## 2012-09-30 MED ORDER — CALCIUM CARBONATE-VITAMIN D 500-200 MG-UNIT PO TABS
1.0000 | ORAL_TABLET | Freq: Two times a day (BID) | ORAL | Status: DC
  Administered 2012-09-30 – 2012-10-06 (×12): 1 via ORAL
  Filled 2012-09-30 (×13): qty 1

## 2012-09-30 MED ORDER — FLUTICASONE PROPIONATE 50 MCG/ACT NA SUSP
2.0000 | Freq: Every day | NASAL | Status: DC
  Administered 2012-09-30 – 2012-10-05 (×6): 2 via NASAL
  Filled 2012-09-30: qty 16

## 2012-09-30 MED ORDER — KCL IN DEXTROSE-NACL 20-5-0.45 MEQ/L-%-% IV SOLN
INTRAVENOUS | Status: DC
  Administered 2012-09-30 – 2012-10-02 (×3): via INTRAVENOUS
  Administered 2012-10-04: 1000 mL via INTRAVENOUS
  Filled 2012-09-30 (×7): qty 1000

## 2012-09-30 MED ORDER — DOCUSATE SODIUM 100 MG PO CAPS: 100.0000 mg | ORAL_CAPSULE | Freq: Two times a day (BID) | ORAL | Status: DC

## 2012-09-30 MED ORDER — CYCLOBENZAPRINE HCL 10 MG PO TABS
10.0000 mg | ORAL_TABLET | Freq: Three times a day (TID) | ORAL | Status: DC | PRN
  Administered 2012-10-04 – 2012-10-05 (×2): 10 mg via ORAL
  Filled 2012-09-30 (×3): qty 1

## 2012-09-30 MED ORDER — DIPHENHYDRAMINE HCL 12.5 MG/5ML PO ELIX
12.5000 mg | ORAL_SOLUTION | Freq: Four times a day (QID) | ORAL | Status: DC | PRN
  Filled 2012-09-30: qty 5

## 2012-09-30 MED ORDER — SODIUM CHLORIDE 0.9 % IR SOLN
Status: DC | PRN
  Administered 2012-09-30: 09:00:00

## 2012-09-30 MED ORDER — VANCOMYCIN HCL IN DEXTROSE 1-5 GM/200ML-% IV SOLN
1000.0000 mg | Freq: Two times a day (BID) | INTRAVENOUS | Status: AC
  Administered 2012-09-30 – 2012-10-01 (×2): 1000 mg via INTRAVENOUS
  Filled 2012-09-30 (×2): qty 200

## 2012-09-30 MED ORDER — BACITRACIN 50000 UNITS IM SOLR
INTRAMUSCULAR | Status: AC
  Filled 2012-09-30: qty 1

## 2012-09-30 MED ORDER — HYDROMORPHONE HCL PF 1 MG/ML IJ SOLN
INTRAMUSCULAR | Status: AC
  Filled 2012-09-30: qty 1

## 2012-09-30 MED ORDER — FONDAPARINUX SODIUM 2.5 MG/0.5ML ~~LOC~~ SOLN
2.5000 mg | Freq: Every day | SUBCUTANEOUS | Status: DC
  Administered 2012-10-01 – 2012-10-06 (×6): 2.5 mg via SUBCUTANEOUS
  Filled 2012-09-30 (×8): qty 0.5

## 2012-09-30 MED ORDER — SODIUM CHLORIDE 0.9 % IJ SOLN: 9.0000 mL | INTRAMUSCULAR | Status: DC | PRN

## 2012-09-30 MED ORDER — SODIUM CHLORIDE 0.9 % IV SOLN
INTRAVENOUS | Status: AC
  Filled 2012-09-30: qty 500

## 2012-09-30 MED ORDER — VECURONIUM BROMIDE 10 MG IV SOLR
INTRAVENOUS | Status: DC | PRN
  Administered 2012-09-30 (×2): 1 mg via INTRAVENOUS
  Administered 2012-09-30: 1.5 mg via INTRAVENOUS
  Administered 2012-09-30: 2 mg via INTRAVENOUS
  Administered 2012-09-30 (×3): 1 mg via INTRAVENOUS

## 2012-09-30 MED ORDER — ONDANSETRON HCL 4 MG/2ML IJ SOLN
INTRAMUSCULAR | Status: DC | PRN
  Administered 2012-09-30: 4 mg via INTRAVENOUS

## 2012-09-30 MED ORDER — ALBUTEROL SULFATE (5 MG/ML) 0.5% IN NEBU: 2.5000 mg | INHALATION_SOLUTION | RESPIRATORY_TRACT | Status: DC | PRN

## 2012-09-30 MED ORDER — PHENYLEPHRINE HCL 10 MG/ML IJ SOLN
10.0000 mg | INTRAVENOUS | Status: DC | PRN
  Administered 2012-09-30: 25 ug/min via INTRAVENOUS

## 2012-09-30 MED ORDER — HYDROMORPHONE 0.3 MG/ML IV SOLN
INTRAVENOUS | Status: DC
  Administered 2012-09-30: 0.5 mg via INTRAVENOUS
  Administered 2012-09-30: 4 mg via INTRAVENOUS
  Administered 2012-09-30: 18:00:00 via INTRAVENOUS
  Administered 2012-10-01: 2.4 mg via INTRAVENOUS
  Administered 2012-10-01: 04:00:00 via INTRAVENOUS
  Administered 2012-10-01: 1.2 mg via INTRAVENOUS
  Administered 2012-10-01: 0.173 mg via INTRAVENOUS
  Filled 2012-09-30 (×2): qty 25

## 2012-09-30 MED ORDER — ROCURONIUM BROMIDE 100 MG/10ML IV SOLN
INTRAVENOUS | Status: DC | PRN
  Administered 2012-09-30: 50 mg via INTRAVENOUS

## 2012-09-30 MED ORDER — PHENOL 1.4 % MT LIQD: 1.0000 | OROMUCOSAL | Status: DC | PRN

## 2012-09-30 MED ORDER — CALCIUM CARBONATE-VITAMIN D 600-400 MG-UNIT PO TABS: 1.0000 | ORAL_TABLET | Freq: Two times a day (BID) | ORAL | Status: DC

## 2012-09-30 MED ORDER — METAXALONE 800 MG PO TABS
800.0000 mg | ORAL_TABLET | Freq: Three times a day (TID) | ORAL | Status: DC | PRN
  Filled 2012-09-30: qty 1

## 2012-09-30 MED ORDER — HYDROMORPHONE HCL 2 MG PO TABS
4.0000 mg | ORAL_TABLET | ORAL | Status: DC | PRN
  Administered 2012-10-05: 4 mg via ORAL
  Filled 2012-09-30: qty 2

## 2012-09-30 MED ORDER — FERROUS SULFATE 325 (65 FE) MG PO TABS
325.0000 mg | ORAL_TABLET | Freq: Every day | ORAL | Status: DC
  Administered 2012-10-01 – 2012-10-06 (×6): 325 mg via ORAL
  Filled 2012-09-30 (×9): qty 1

## 2012-09-30 MED ORDER — PROMETHAZINE HCL 25 MG/ML IJ SOLN: 6.2500 mg | INTRAMUSCULAR | Status: DC | PRN

## 2012-09-30 MED ORDER — ATORVASTATIN CALCIUM 20 MG PO TABS
20.0000 mg | ORAL_TABLET | Freq: Every day | ORAL | Status: DC
  Administered 2012-10-01 – 2012-10-06 (×6): 20 mg via ORAL
  Filled 2012-09-30 (×7): qty 1

## 2012-09-30 MED ORDER — ONDANSETRON HCL 4 MG/2ML IJ SOLN: 4.0000 mg | INTRAMUSCULAR | Status: DC | PRN

## 2012-09-30 MED ORDER — DOCUSATE SODIUM 100 MG PO CAPS
200.0000 mg | ORAL_CAPSULE | Freq: Two times a day (BID) | ORAL | Status: DC
  Administered 2012-09-30 – 2012-10-06 (×12): 200 mg via ORAL
  Filled 2012-09-30 (×3): qty 2
  Filled 2012-09-30: qty 1
  Filled 2012-09-30 (×7): qty 2

## 2012-09-30 MED ORDER — PROPOFOL 10 MG/ML IV BOLUS
INTRAVENOUS | Status: DC | PRN
  Administered 2012-09-30: 180 mg via INTRAVENOUS

## 2012-09-30 MED ORDER — PANTOPRAZOLE SODIUM 40 MG PO TBEC
40.0000 mg | DELAYED_RELEASE_TABLET | Freq: Every day | ORAL | Status: DC
  Administered 2012-10-01 – 2012-10-06 (×6): 40 mg via ORAL
  Filled 2012-09-30 (×4): qty 1

## 2012-09-30 MED ORDER — NALOXONE HCL 0.4 MG/ML IJ SOLN: 0.4000 mg | INTRAMUSCULAR | Status: DC | PRN

## 2012-09-30 MED ORDER — NEOSTIGMINE METHYLSULFATE 1 MG/ML IJ SOLN
INTRAMUSCULAR | Status: DC | PRN
  Administered 2012-09-30: 4 mg via INTRAVENOUS

## 2012-09-30 MED ORDER — FLEET ENEMA 7-19 GM/118ML RE ENEM
1.0000 | ENEMA | Freq: Once | RECTAL | Status: AC | PRN
  Filled 2012-09-30: qty 1

## 2012-09-30 MED ORDER — MONTELUKAST SODIUM 10 MG PO TABS
10.0000 mg | ORAL_TABLET | Freq: Every day | ORAL | Status: DC
  Administered 2012-09-30 – 2012-10-05 (×6): 10 mg via ORAL
  Filled 2012-09-30 (×8): qty 1

## 2012-09-30 MED ORDER — SODIUM CHLORIDE 0.9 % IJ SOLN
3.0000 mL | INTRAMUSCULAR | Status: DC | PRN
  Administered 2012-09-30: 3 mL via INTRAVENOUS

## 2012-09-30 MED ORDER — BUPIVACAINE HCL (PF) 0.5 % IJ SOLN
INTRAMUSCULAR | Status: DC | PRN
  Administered 2012-09-30: 5 mL

## 2012-09-30 MED ORDER — FENTANYL 25 MCG/HR TD PT72
100.0000 ug | MEDICATED_PATCH | TRANSDERMAL | Status: DC
  Administered 2012-10-01 – 2012-10-05 (×3): 100 ug via TRANSDERMAL
  Filled 2012-09-30: qty 4
  Filled 2012-09-30: qty 1
  Filled 2012-09-30: qty 4
  Filled 2012-09-30: qty 1

## 2012-09-30 MED ORDER — LIDOCAINE 5 % EX PTCH
1.0000 | MEDICATED_PATCH | CUTANEOUS | Status: DC | PRN
  Filled 2012-09-30: qty 1

## 2012-09-30 MED ORDER — GABAPENTIN 600 MG PO TABS
900.0000 mg | ORAL_TABLET | Freq: Every day | ORAL | Status: DC
  Filled 2012-09-30: qty 1.5

## 2012-09-30 MED ORDER — DIAZEPAM 5 MG PO TABS
5.0000 mg | ORAL_TABLET | Freq: Four times a day (QID) | ORAL | Status: DC | PRN
  Administered 2012-09-30 – 2012-10-05 (×3): 5 mg via ORAL
  Filled 2012-09-30 (×5): qty 1

## 2012-09-30 MED ORDER — HYDROCODONE-ACETAMINOPHEN 5-325 MG PO TABS
1.0000 | ORAL_TABLET | ORAL | Status: DC | PRN
  Administered 2012-09-30: 1 via ORAL
  Filled 2012-09-30: qty 1

## 2012-09-30 MED ORDER — NITROGLYCERIN 0.4 MG SL SUBL: 0.4000 mg | SUBLINGUAL_TABLET | SUBLINGUAL | Status: DC | PRN

## 2012-09-30 MED ORDER — POLYETHYLENE GLYCOL 3350 17 G PO PACK
17.0000 g | PACK | Freq: Every day | ORAL | Status: DC | PRN
  Filled 2012-09-30: qty 1

## 2012-09-30 MED ORDER — OXYCODONE HCL 5 MG/5ML PO SOLN: 5.0000 mg | Freq: Once | ORAL | Status: DC | PRN

## 2012-09-30 MED ORDER — ASPIRIN 81 MG PO TBEC: 81.0000 mg | DELAYED_RELEASE_TABLET | Freq: Every day | ORAL | Status: DC

## 2012-09-30 MED ORDER — GLYCOPYRROLATE 0.2 MG/ML IJ SOLN
INTRAMUSCULAR | Status: DC | PRN
  Administered 2012-09-30: 0.6 mg via INTRAVENOUS

## 2012-09-30 MED ORDER — BISACODYL 10 MG RE SUPP: 10.0000 mg | Freq: Every day | RECTAL | Status: DC | PRN

## 2012-09-30 MED ORDER — ONDANSETRON HCL 4 MG/2ML IJ SOLN: 4.0000 mg | Freq: Four times a day (QID) | INTRAMUSCULAR | Status: DC | PRN

## 2012-09-30 MED ORDER — ACETAMINOPHEN 10 MG/ML IV SOLN
INTRAVENOUS | Status: AC
  Administered 2012-09-30: 1000 mg via INTRAVENOUS
  Filled 2012-09-30: qty 100

## 2012-09-30 MED ORDER — GABAPENTIN 300 MG PO CAPS
900.0000 mg | ORAL_CAPSULE | Freq: Every day | ORAL | Status: DC
  Administered 2012-09-30 – 2012-10-05 (×6): 900 mg via ORAL
  Filled 2012-09-30 (×7): qty 3

## 2012-09-30 MED ORDER — ZOLPIDEM TARTRATE 5 MG PO TABS: 5.0000 mg | ORAL_TABLET | Freq: Every evening | ORAL | Status: DC | PRN

## 2012-09-30 MED ORDER — OXYCODONE HCL 5 MG PO TABS: 5.0000 mg | ORAL_TABLET | Freq: Once | ORAL | Status: DC | PRN

## 2012-09-30 MED ORDER — ALBUMIN HUMAN 5 % IV SOLN
INTRAVENOUS | Status: DC | PRN
  Administered 2012-09-30 (×2): via INTRAVENOUS

## 2012-09-30 MED ORDER — DIPHENHYDRAMINE HCL 50 MG/ML IJ SOLN: 12.5000 mg | Freq: Four times a day (QID) | INTRAMUSCULAR | Status: DC | PRN

## 2012-09-30 MED ORDER — SODIUM CHLORIDE 0.9 % IV SOLN: 250.0000 mL | INTRAVENOUS | Status: DC

## 2012-09-30 MED ORDER — SENNA 8.6 MG PO TABS
1.0000 | ORAL_TABLET | Freq: Two times a day (BID) | ORAL | Status: DC
  Administered 2012-09-30 – 2012-10-06 (×12): 8.6 mg via ORAL
  Filled 2012-09-30 (×14): qty 1

## 2012-09-30 MED ORDER — OXYCODONE-ACETAMINOPHEN 5-325 MG PO TABS
1.0000 | ORAL_TABLET | ORAL | Status: DC | PRN
  Administered 2012-10-05: 2 via ORAL
  Administered 2012-10-05: 1 via ORAL
  Administered 2012-10-06: 2 via ORAL
  Filled 2012-09-30: qty 2
  Filled 2012-09-30: qty 1
  Filled 2012-09-30: qty 2

## 2012-09-30 MED ORDER — FENTANYL CITRATE 0.05 MG/ML IJ SOLN
INTRAMUSCULAR | Status: DC | PRN
  Administered 2012-09-30: 150 ug via INTRAVENOUS
  Administered 2012-09-30: 100 ug via INTRAVENOUS

## 2012-09-30 MED ORDER — METOPROLOL TARTRATE 50 MG PO TABS
50.0000 mg | ORAL_TABLET | Freq: Two times a day (BID) | ORAL | Status: DC
  Administered 2012-09-30 – 2012-10-06 (×12): 50 mg via ORAL
  Filled 2012-09-30 (×13): qty 1

## 2012-09-30 MED ORDER — ACETAMINOPHEN 325 MG PO TABS: 650.0000 mg | ORAL_TABLET | ORAL | Status: DC | PRN

## 2012-09-30 MED ORDER — EPHEDRINE SULFATE 50 MG/ML IJ SOLN: INTRAMUSCULAR | Status: DC | PRN

## 2012-09-30 MED ORDER — DIAZEPAM 5 MG PO TABS
5.0000 mg | ORAL_TABLET | Freq: Every day | ORAL | Status: DC
  Administered 2012-09-30 – 2012-10-05 (×6): 5 mg via ORAL
  Filled 2012-09-30 (×4): qty 1

## 2012-09-30 MED ORDER — LIDOCAINE-EPINEPHRINE 1 %-1:100000 IJ SOLN
INTRAMUSCULAR | Status: DC | PRN
  Administered 2012-09-30: 5 mL

## 2012-09-30 MED ORDER — LIDOCAINE HCL (CARDIAC) 20 MG/ML IV SOLN
INTRAVENOUS | Status: DC | PRN
  Administered 2012-09-30: 100 mg via INTRAVENOUS

## 2012-09-30 SURGICAL SUPPLY — 86 items
70 mm Screw (Neuro Prosthesis/Implant) ×4 IMPLANT
BAG DECANTER FOR FLEXI CONT (MISCELLANEOUS) ×2 IMPLANT
BENZOIN TINCTURE PRP APPL 2/3 (GAUZE/BANDAGES/DRESSINGS) ×2 IMPLANT
BLADE SURG ROTATE 9660 (MISCELLANEOUS) IMPLANT
BONE VOID FILLER STRIP 10CC (Bone Implant) ×4 IMPLANT
BUR MATCHSTICK NEURO 3.0 LAGG (BURR) ×2 IMPLANT
BUR PRECISION FLUTE 5.0 (BURR) ×2 IMPLANT
CANISTER SUCTION 2500CC (MISCELLANEOUS) ×2 IMPLANT
CAP LCK SPNE (Orthopedic Implant) ×8 IMPLANT
CAP LOCK SPINE RADIUS (Orthopedic Implant) ×8 IMPLANT
CAP LOCKING (Orthopedic Implant) ×8 IMPLANT
CLOTH BEACON ORANGE TIMEOUT ST (SAFETY) ×2 IMPLANT
CONNECTOR NEUTRAL OFFSET ILIAC (Connector) ×4 IMPLANT
CONNECTOR RADIUS STS-0 LUMBAR (Connector) ×2 IMPLANT
CONT SPEC 4OZ CLIKSEAL STRL BL (MISCELLANEOUS) ×4 IMPLANT
COVER BACK TABLE 24X17X13 BIG (DRAPES) ×2 IMPLANT
COVER TABLE BACK 60X90 (DRAPES) IMPLANT
DECANTER SPIKE VIAL GLASS SM (MISCELLANEOUS) ×2 IMPLANT
DERMABOND ADVANCED (GAUZE/BANDAGES/DRESSINGS)
DERMABOND ADVANCED .7 DNX12 (GAUZE/BANDAGES/DRESSINGS) IMPLANT
DRAPE C-ARM 42X72 X-RAY (DRAPES) ×4 IMPLANT
DRAPE LAPAROTOMY 100X72X124 (DRAPES) ×2 IMPLANT
DRAPE POUCH INSTRU U-SHP 10X18 (DRAPES) ×2 IMPLANT
DRAPE SURG 17X23 STRL (DRAPES) ×2 IMPLANT
DRESSING TELFA 8X3 (GAUZE/BANDAGES/DRESSINGS) ×2 IMPLANT
DURAPREP 26ML APPLICATOR (WOUND CARE) ×2 IMPLANT
ELECT REM PT RETURN 9FT ADLT (ELECTROSURGICAL) ×2
ELECTRODE REM PT RTRN 9FT ADLT (ELECTROSURGICAL) ×1 IMPLANT
GAUZE SPONGE 4X4 16PLY XRAY LF (GAUZE/BANDAGES/DRESSINGS) IMPLANT
GLOVE BIO SURGEON STRL SZ8 (GLOVE) ×6 IMPLANT
GLOVE BIOGEL PI IND STRL 7.0 (GLOVE) ×2 IMPLANT
GLOVE BIOGEL PI IND STRL 8 (GLOVE) ×2 IMPLANT
GLOVE BIOGEL PI IND STRL 8.5 (GLOVE) ×2 IMPLANT
GLOVE BIOGEL PI INDICATOR 7.0 (GLOVE) ×2
GLOVE BIOGEL PI INDICATOR 8 (GLOVE) ×2
GLOVE BIOGEL PI INDICATOR 8.5 (GLOVE) ×2
GLOVE ECLIPSE 8.0 STRL XLNG CF (GLOVE) ×4 IMPLANT
GLOVE EXAM NITRILE LRG STRL (GLOVE) IMPLANT
GLOVE EXAM NITRILE MD LF STRL (GLOVE) ×4 IMPLANT
GLOVE EXAM NITRILE XL STR (GLOVE) IMPLANT
GLOVE EXAM NITRILE XS STR PU (GLOVE) IMPLANT
GLOVE SS BIOGEL STRL SZ 6.5 (GLOVE) ×4 IMPLANT
GLOVE SUPERSENSE BIOGEL SZ 6.5 (GLOVE) ×4
GOWN BRE IMP SLV AUR LG STRL (GOWN DISPOSABLE) ×4 IMPLANT
GOWN BRE IMP SLV AUR XL STRL (GOWN DISPOSABLE) ×6 IMPLANT
GOWN STRL REIN 2XL LVL4 (GOWN DISPOSABLE) ×4 IMPLANT
KIT BASIN OR (CUSTOM PROCEDURE TRAY) ×2 IMPLANT
KIT INFUSE MEDIUM (Orthopedic Implant) ×2 IMPLANT
KIT POSITION SURG JACKSON T1 (MISCELLANEOUS) ×2 IMPLANT
KIT ROOM TURNOVER OR (KITS) ×2 IMPLANT
MILL MEDIUM DISP (BLADE) IMPLANT
NEEDLE HYPO 25X1 1.5 SAFETY (NEEDLE) ×2 IMPLANT
NEEDLE SPNL 18GX3.5 QUINCKE PK (NEEDLE) ×2 IMPLANT
NS IRRIG 1000ML POUR BTL (IV SOLUTION) ×2 IMPLANT
Open Offset (Neuro Prosthesis/Implant) ×2 IMPLANT
PACK LAMINECTOMY NEURO (CUSTOM PROCEDURE TRAY) ×2 IMPLANT
PAD ARMBOARD 7.5X6 YLW CONV (MISCELLANEOUS) ×6 IMPLANT
PATTIES SURGICAL .5 X.5 (GAUZE/BANDAGES/DRESSINGS) IMPLANT
PATTIES SURGICAL .5 X1 (DISPOSABLE) IMPLANT
PATTIES SURGICAL 1X1 (DISPOSABLE) IMPLANT
RASP 3.0MM (RASP) ×2 IMPLANT
ROD 120MM (Rod) ×1 IMPLANT
ROD 140MM (Rod) ×2 IMPLANT
ROD RADIUS 120MM (Rod) IMPLANT
ROD SPNL 120X5.5 NS TI RDS (Rod) ×1 IMPLANT
SCREW 8.75X55 (Screw) ×4 IMPLANT
SCREW RADIUS 7.75X55MM (Screw) ×2 IMPLANT
SCREW SET SPINAL STD HEXALOBE (Screw) ×8 IMPLANT
SPONGE GAUZE 4X4 12PLY (GAUZE/BANDAGES/DRESSINGS) ×2 IMPLANT
SPONGE LAP 4X18 X RAY DECT (DISPOSABLE) IMPLANT
SPONGE SURGIFOAM ABS GEL 100 (HEMOSTASIS) ×2 IMPLANT
STAPLER SKIN PROX WIDE 3.9 (STAPLE) IMPLANT
STRIP CLOSURE SKIN 1/2X4 (GAUZE/BANDAGES/DRESSINGS) ×2 IMPLANT
SUT VIC AB 1 CT1 18XBRD ANBCTR (SUTURE) ×2 IMPLANT
SUT VIC AB 1 CT1 8-18 (SUTURE) ×2
SUT VIC AB 2-0 CT1 18 (SUTURE) ×4 IMPLANT
SUT VIC AB 3-0 SH 8-18 (SUTURE) ×4 IMPLANT
SYR 20ML ECCENTRIC (SYRINGE) ×2 IMPLANT
SYR 3ML LL SCALE MARK (SYRINGE) ×4 IMPLANT
SYR 5ML LL (SYRINGE) IMPLANT
TAPE CLOTH SURG 4X10 WHT LF (GAUZE/BANDAGES/DRESSINGS) ×2 IMPLANT
TOWEL OR 17X24 6PK STRL BLUE (TOWEL DISPOSABLE) ×2 IMPLANT
TOWEL OR 17X26 10 PK STRL BLUE (TOWEL DISPOSABLE) ×2 IMPLANT
TRAP SPECIMEN MUCOUS 40CC (MISCELLANEOUS) ×2 IMPLANT
TRAY FOLEY CATH 14FRSI W/METER (CATHETERS) ×2 IMPLANT
WATER STERILE IRR 1000ML POUR (IV SOLUTION) ×2 IMPLANT

## 2012-09-30 NOTE — Anesthesia Preprocedure Evaluation (Addendum)
Anesthesia Evaluation  Patient identified by MRN, date of birth, ID band Patient awake    Reviewed: Allergy & Precautions, H&P , NPO status , Patient's Chart, lab work & pertinent test results  Airway Mallampati: III TM Distance: >3 FB Neck ROM: Full    Dental  (+) Teeth Intact   Pulmonary asthma ,    Pulmonary exam normal       Cardiovascular hypertension, Pt. on home beta blockers and Pt. on medications + CAD and + Peripheral Vascular Disease + dysrhythmias Atrial Fibrillation - Valvular Problems/Murmurs    Neuro/Psych PSYCHIATRIC DISORDERS Anxiety    GI/Hepatic Neg liver ROS, hiatal hernia, GERD-  Medicated,  Endo/Other  Hypothyroidism   Renal/GU negative Renal ROS     Musculoskeletal   Abdominal   Peds  Hematology  (+) Blood dyscrasia, ,   Anesthesia Other Findings   Reproductive/Obstetrics                        Anesthesia Physical Anesthesia Plan  ASA: III  Anesthesia Plan: General   Post-op Pain Management:    Induction: Intravenous  Airway Management Planned: Oral ETT and Video Laryngoscope Planned  Additional Equipment:   Intra-op Plan:   Post-operative Plan: Extubation in OR  Informed Consent: I have reviewed the patients History and Physical, chart, labs and discussed the procedure including the risks, benefits and alternatives for the proposed anesthesia with the patient or authorized representative who has indicated his/her understanding and acceptance.   Dental advisory given  Plan Discussed with: CRNA, Anesthesiologist and Surgeon  Anesthesia Plan Comments:       Anesthesia Quick Evaluation

## 2012-09-30 NOTE — Op Note (Signed)
09/30/2012  12:46 PM  PATIENT:  Latoya [name]  66 y.o. 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/S1 , Intractable low back pain, thoracolumbar scoliosis, spondylosis  POST-OPERATIVE DIAGNOSIS:  Non union L 5/S1 , Intractable low back pain, thoracolumbar scoliosis, spondylosis   PROCEDURE:  Procedure(s) (LRB) with comments: POSTERIOR LUMBAR FUSION 1 LEVEL (N/A) - Revision of Lumbosacral fusion with iliac screws and posterior lateral arthrodesis with pedicle screw fixation  SURGEON:  Surgeon(s) and Role:    * Maeola Harman, MD - Primary    * Tia Alert, MD - Assisting  PHYSICIAN ASSISTANT:   ASSISTANTS: Poteat, RN   ANESTHESIA:   general  EBL:  Total I/O In: 2500 [I.V.:2000; IV Piggyback:500] Out: 1070 [Urine:870; Blood:200]  BLOOD ADMINISTERED:none  DRAINS: none   LOCAL MEDICATIONS USED:  LIDOCAINE   SPECIMEN:  No Specimen  DISPOSITION OF SPECIMEN:  N/A  COUNTS:  YES  TOURNIQUET:  * No tourniquets in log *  DICTATION: DICTATION: Patient is 66 year old woman with prior fusion at thoracolumbar region along with ALIF at L5/S1 with lumbar pseudoarthrosis at the L5/S1 level with intractable pain and abnormal motion at the L5/S1 level. It was elected to take her to surgery for exploration of prior fusion with redo decompression and fusion at the L 5/S1 level with iliac screw fixation.   Procedure: Patient was placed in a prone position on the Empire table after smooth and uncomplicated induction of general endotracheal anesthesia. Her low back was prepped and draped in usual sterile fashion with betadine scrub and DuraPrep. Area of incision was infiltrated with local lidocaine. Incision was made to the lumbodorsal fascia was incised and exposure was performed of the previously placed hardware, which was exposed.  The posterior superior iliac crest was identified bilaterally and C-arm was used to place 8.5 x 70 mm iliac screws bilaterally without difficulty or cutouts.   The iliac bone was removed and saved for autografting and to allow for the screw heads to be recessed. After careful exploration, there appeared not to be good bridging bone growth with mobility at the previously operated level. The rods were then exposed and cut above the L5 screw  On the right and the L4 screw on the left. The sacral screws were replaced after penetrating the cortex for bicortical purchase.  There was obvious loosening around the old sacral screws.  8.75 x 55 mm screw was placed on the right and 7.75 x 55 mm screw on the left. A 140 mm rod was lordosed and affixed to the rod with a connector on the left and offset connectors and 120 mm rod was placed at L5, S1, and iliac on the right. A thorough decortication of the sacrum and upper iliac rest lamina, facet and posterolateral rgion was performed and  This area was packed with medium BMP, local autograft,and NexOss bone graft extender (20cc). Intraoperative fluoroscopy confirmed correct orientationin the AP and lateral plane.  Final x-rays demonstrated well-positioned interbody graft and pedicle screw fixation.   Fascia was closed with 1 Vicryl sutures skin edges were reapproximated 2 and 3-0 Vicryl sutures. The wound is dressed with benzoin Steri-Strips Telfa gauze and tape the patient was extubated in the operating room and taken to recovery in stable satisfactory condition. Counts were correct at the end of the case.  PLAN OF CARE: Admit to inpatient   PATIENT DISPOSITION:  PACU - hemodynamically stable.   Delay start of Pharmacological VTE agent (>24hrs) due to surgical blood loss or  risk of bleeding: yes

## 2012-09-30 NOTE — Transfer of Care (Signed)
Immediate Anesthesia Transfer of Care Note  Patient: Latoya [name]  Procedure(s) Performed: Procedure(s) (LRB) with comments: POSTERIOR LUMBAR FUSION 1 LEVEL (N/A) - Revision of Lumbosacral fusion with iliac screws and posterior lateral arthrodesis  Patient Location: PACU  Anesthesia Type:General  Level of Consciousness: sedated  Airway & Oxygen Therapy: Patient Spontanous Breathing and Patient connected to nasal cannula oxygen  Post-op Assessment: Report given to PACU RN, Post -op Vital signs reviewed and stable and Patient moving all extremities  Post vital signs: Reviewed and stable  Complications: No apparent anesthesia complications

## 2012-09-30 NOTE — Anesthesia Postprocedure Evaluation (Signed)
Anesthesia Post Note  Patient: Latoya [name]  Procedure(s) Performed: Procedure(s) (LRB): POSTERIOR LUMBAR FUSION 1 LEVEL (N/A)  Anesthesia type: general  Patient location: PACU  Post pain: Pain level controlled  Post assessment: Patient's Cardiovascular Status Stable  Last Vitals:  Filed Vitals:   09/30/12 1500  BP: 117/80  Pulse: 80  Temp:   Resp: 15    Post vital signs: Reviewed and stable  Level of consciousness: sedated  Complications: No apparent anesthesia complications

## 2012-09-30 NOTE — Brief Op Note (Signed)
09/30/2012  12:46 PM  PATIENT:  Latoya [name]  66 y.o. 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/S1 , Intractable low back pain, thoracolumbar scoliosis, spondylosis  POST-OPERATIVE DIAGNOSIS:  Non union L 5/S1 , Intractable low back pain, thoracolumbar scoliosis, spondylosis   PROCEDURE:  Procedure(s) (LRB) with comments: POSTERIOR LUMBAR FUSION 1 LEVEL (N/A) - Revision of Lumbosacral fusion with iliac screws and posterior lateral arthrodesis with pedicle screw fixation  SURGEON:  Surgeon(s) and Role:    * Zafirah Vanzee, MD - Primary    * David S Jones, MD - Assisting  PHYSICIAN ASSISTANT:   ASSISTANTS: Poteat, RN   ANESTHESIA:   general  EBL:  Total I/O In: 2500 [I.V.:2000; IV Piggyback:500] Out: 1070 [Urine:870; Blood:200]  BLOOD ADMINISTERED:none  DRAINS: none   LOCAL MEDICATIONS USED:  LIDOCAINE   SPECIMEN:  No Specimen  DISPOSITION OF SPECIMEN:  N/A  COUNTS:  YES  TOURNIQUET:  * No tourniquets in log *  DICTATION: DICTATION: Patient is 66-year-old woman with prior fusion at thoracolumbar region along with ALIF at L5/S1 with lumbar pseudoarthrosis at the L5/S1 level with intractable pain and abnormal motion at the L5/S1 level. It was elected to take her to surgery for exploration of prior fusion with redo decompression and fusion at the L 5/S1 level with iliac screw fixation.   Procedure: Patient was placed in a prone position on the Jackson table after smooth and uncomplicated induction of general endotracheal anesthesia. Her low back was prepped and draped in usual sterile fashion with betadine scrub and DuraPrep. Area of incision was infiltrated with local lidocaine. Incision was made to the lumbodorsal fascia was incised and exposure was performed of the previously placed hardware, which was exposed.  The posterior superior iliac crest was identified bilaterally and C-arm was used to place 8.5 x 70 mm iliac screws bilaterally without difficulty or cutouts.   The iliac bone was removed and saved for autografting and to allow for the screw heads to be recessed. After careful exploration, there appeared not to be good bridging bone growth with mobility at the previously operated level. The rods were then exposed and cut above the L5 screw  On the right and the L4 screw on the left. The sacral screws were replaced after penetrating the cortex for bicortical purchase.  There was obvious loosening around the old sacral screws.  8.75 x 55 mm screw was placed on the right and 7.75 x 55 mm screw on the left. A 140 mm rod was lordosed and affixed to the rod with a connector on the left and offset connectors and 120 mm rod was placed at L5, S1, and iliac on the right. A thorough decortication of the sacrum and upper iliac rest lamina, facet and posterolateral rgion was performed and  This area was packed with medium BMP, local autograft,and NexOss bone graft extender (20cc). Intraoperative fluoroscopy confirmed correct orientationin the AP and lateral plane.  Final x-rays demonstrated well-positioned interbody graft and pedicle screw fixation.   Fascia was closed with 1 Vicryl sutures skin edges were reapproximated 2 and 3-0 Vicryl sutures. The wound is dressed with benzoin Steri-Strips Telfa gauze and tape the patient was extubated in the operating room and taken to recovery in stable satisfactory condition. Counts were correct at the end of the case.  PLAN OF CARE: Admit to inpatient   PATIENT DISPOSITION:  PACU - hemodynamically stable.   Delay start of Pharmacological VTE agent (>24hrs) due to surgical blood loss or   risk of bleeding: yes  

## 2012-09-30 NOTE — Progress Notes (Signed)
Doing well following lumbar fusion.

## 2012-09-30 NOTE — Progress Notes (Signed)
Patient ID: Latoya [name], female   DOB: 1946/09/09, 66 y.o.   MRN: [id number]  Alert, conversant, in good spirits. Good strength BLE. Lumbar pain moderate to severe, but responsive to & controlled by meds. Preparing for transfer to 3100.  [name], RN, BSN

## 2012-10-01 MED ORDER — DIPHENHYDRAMINE HCL 50 MG/ML IJ SOLN: 12.5000 mg | Freq: Four times a day (QID) | INTRAMUSCULAR | Status: DC | PRN

## 2012-10-01 MED ORDER — SODIUM CHLORIDE 0.9 % IJ SOLN
9.0000 mL | INTRAMUSCULAR | Status: DC | PRN
  Administered 2012-10-05: 9 mL via INTRAVENOUS

## 2012-10-01 MED ORDER — DIPHENHYDRAMINE HCL 12.5 MG/5ML PO ELIX
12.5000 mg | ORAL_SOLUTION | Freq: Four times a day (QID) | ORAL | Status: DC | PRN
  Filled 2012-10-01: qty 5

## 2012-10-01 MED ORDER — ONDANSETRON HCL 4 MG/2ML IJ SOLN: 4.0000 mg | Freq: Four times a day (QID) | INTRAMUSCULAR | Status: DC | PRN

## 2012-10-01 MED ORDER — FENTANYL 10 MCG/ML IV SOLN
INTRAVENOUS | Status: DC
  Administered 2012-10-01 (×2): 45 ug via INTRAVENOUS
  Administered 2012-10-01: 09:00:00 via INTRAVENOUS
  Administered 2012-10-01: 120 ug via INTRAVENOUS
  Administered 2012-10-02: 09:00:00 via INTRAVENOUS
  Administered 2012-10-02: 175.5 ug via INTRAVENOUS
  Administered 2012-10-02: 60 ug via INTRAVENOUS
  Administered 2012-10-02: 45 ug via INTRAVENOUS
  Administered 2012-10-02: 90 ug via INTRAVENOUS
  Administered 2012-10-02: 60 ug via INTRAVENOUS
  Administered 2012-10-03: 165 ug via INTRAVENOUS
  Administered 2012-10-03: 45 ug via INTRAVENOUS
  Administered 2012-10-03: 105 ug via INTRAVENOUS
  Administered 2012-10-03: 60 ug via INTRAVENOUS
  Administered 2012-10-03: 105 ug via INTRAVENOUS
  Administered 2012-10-04: 75 ug via INTRAVENOUS
  Administered 2012-10-04: 30 ug via INTRAVENOUS
  Administered 2012-10-04: 135 ug via INTRAVENOUS
  Administered 2012-10-04: 45 ug via INTRAVENOUS
  Administered 2012-10-05: 60 ug via INTRAVENOUS
  Administered 2012-10-05: 75 ug via INTRAVENOUS
  Filled 2012-10-01 (×4): qty 50

## 2012-10-01 MED ORDER — NALOXONE HCL 0.4 MG/ML IJ SOLN: 0.4000 mg | INTRAMUSCULAR | Status: DC | PRN

## 2012-10-01 MED FILL — Heparin Sodium (Porcine) Inj 1000 Unit/ML: INTRAMUSCULAR | Qty: 30 | Status: AC

## 2012-10-01 MED FILL — Sodium Chloride IV Soln 0.9%: INTRAVENOUS | Qty: 1000 | Status: AC

## 2012-10-01 NOTE — Progress Notes (Signed)
Good strength in legs.  C/O soreness in back.  Will change to fentanyl PCA. Mobilize with PT. No A Fib noted.

## 2012-10-01 NOTE — Evaluation (Signed)
Occupational Therapy Evaluation Patient Details Name: Latoya [name] MRN: [id number] DOB: August 28, 1946 Today's Date: 10/01/2012 Time: 8413-2440 OT Time Calculation (min): 38 min  OT Assessment / Plan / Recommendation Clinical Impression  Pt s/p L5-S1 fusion. Pt currently limited in safety and independence with ADLs and will benefit from skilled OT to maximize I with ADL and ADL mobility in prep for return home. Pending pt's progress, may be able to d/c home with assist of husband.     OT Assessment  Patient needs continued OT Services    Follow Up Recommendations  Home health OT;Supervision/Assistance - 24 hour    Barriers to Discharge      Equipment Recommendations  None recommended by OT    Recommendations for Other Services    Frequency  Min 2X/week    Precautions / Restrictions Precautions Precautions: Back;Fall Required Braces or Orthoses: Spinal Brace Spinal Brace: Thoracolumbosacral orthotic;Applied in sitting position   Pertinent Vitals/Pain Pt reports 6-7/10 back/left leg pain and states this increases with activity. PCA available. Pt repositioned at end of session for pain relief    ADL  Upper Body Bathing: Moderate assistance Where Assessed - Upper Body Bathing: Supported sitting Lower Body Bathing: +1 Total assistance Where Assessed - Lower Body Bathing: Supported sit to stand Upper Body Dressing: Moderate assistance Where Assessed - Upper Body Dressing: Supported sitting Lower Body Dressing: +1 Total assistance Where Assessed - Lower Body Dressing: Supported sit to Pharmacist, hospital: +2 Total assistance Toilet Transfer: Patient Percentage: 60% Statistician Method: Surveyor, minerals:  (bed to chair) Toileting - Clothing Manipulation and Hygiene: +1 Total assistance Where Assessed - Toileting Clothing Manipulation and Hygiene: Sit to stand from 3-in-1 or toilet Equipment Used: Back brace;Gait belt Transfers/Ambulation Related to  ADLs: +2total(pt=60%) stand pivot from bed to chair. pt with difficulty fully extending at knees. ADL Comments: Pt able to verbalize 3/3 back precautions from previous surgeries. However, she requires Mod VC to maintain during eval. Donned TLSO brace in sitting as there are no specific orders- note there is a substantial amount of space between inferior portion of posterior piece of brace- pt states MD wanted to assist with correcting kyphosis??   OT Diagnosis: Generalized weakness;Acute pain  OT Problem List: Decreased activity tolerance;Impaired balance (sitting and/or standing);Decreased safety awareness;Decreased knowledge of use of DME or AE;Decreased knowledge of precautions;Pain OT Treatment Interventions: Self-care/ADL training;DME and/or AE instruction;Therapeutic activities;Patient/family education;Balance training   OT Goals Acute Rehab OT Goals OT Goal Formulation: With patient/family Time For Goal Achievement: 10/08/12 Potential to Achieve Goals: Good ADL Goals Pt Will Perform Grooming: Independently;Standing at sink ADL Goal: Grooming - Progress: Goal set today Pt Will Perform Upper Body Dressing: with set-up;Sitting, chair;Sitting, bed ADL Goal: Upper Body Dressing - Progress: Goal set today Pt Will Perform Lower Body Dressing: with modified independence;with adaptive equipment;Sit to stand from bed;Sit to stand from chair ADL Goal: Lower Body Dressing - Progress: Goal set today Pt Will Transfer to Toilet: with modified independence;Ambulation;with DME ADL Goal: Toilet Transfer - Progress: Goal set today Pt Will Perform Toileting - Clothing Manipulation: Independently;Standing ADL Goal: Toileting - Clothing Manipulation - Progress: Goal set today Pt Will Perform Toileting - Hygiene: with modified independence;with adaptive equipment;Standing at 3-in-1/toilet ADL Goal: Toileting - Hygiene - Progress: Goal set today Pt Will Perform Tub/Shower Transfer: Shower transfer;with  supervision;Ambulation;with DME ADL Goal: Tub/Shower Transfer - Progress: Goal set today Additional ADL Goal #1: Pt will verbalize/generalize 3/3 back precautions for use with functional activities. ADL  Goal: Additional Goal #1 - Progress: Goal set today Additional ADL Goal #2: Pt will I'ly instruct caregiver on donning/doffing of back brace. ADL Goal: Additional Goal #2 - Progress: Goal set today  Visit Information  Last OT Received On: 10/01/12 Assistance Needed: +2    Subjective Data  Subjective: I'll do what I can Patient Stated Goal: Return home with husband   Prior Functioning     Home Living Lives With: Spouse Available Help at Discharge: Available 24 hours/day;Family Type of Home: House Home Access: Stairs to enter [employer] of Steps: 4 Entrance Stairs-Rails: Right;Left Home Layout: One level Bathroom Shower/Tub: Walk-in shower;Door [employer] Toilet: Standard Bathroom Accessibility: Yes How Accessible: Accessible via walker Home Adaptive Equipment: Walker - four wheeled;Grab bars around toilet;Grab bars in shower;Bedside commode/3-in-1;Tub transfer bench (scooter) Prior Function Level of Independence: Independent with assistive device(s) Able to Take Stairs?: Reciprically Driving: Yes Vocation: Retired Comments: required some assist to retrieve items from high shelves Communication Communication: No difficulties Dominant Hand: Right         Vision/Perception     Cognition  Overall Cognitive Status: Appears within functional limits for tasks assessed/performed Arousal/Alertness: Lethargic Orientation Level: Appears intact for tasks assessed Behavior During Session: [facility] for tasks performed    Extremity/Trunk Assessment Right Upper Extremity Assessment RUE ROM/Strength/Tone: WFL for tasks assessed RUE Sensation: WFL - Light Touch Left Upper Extremity Assessment LUE ROM/Strength/Tone: WFL for tasks assessed LUE Sensation: WFL - Light Touch      Mobility Bed Mobility Bed Mobility: Rolling Right;Right Sidelying to Sit;Sitting - Scoot to Edge of Bed Rolling Right: 3: Mod assist;With rail Right Sidelying to Sit: 3: Mod assist;With rails Sitting - Scoot to Edge of Bed: 4: Min assist Details for Bed Mobility Assistance: VC for sequencing and hand placement     Shoulder Instructions     Exercise     Balance     End of Session OT - End of Session Equipment Utilized During Treatment: Gait belt;Back brace Activity Tolerance: Patient limited by pain;Patient limited by fatigue Patient left: in chair;with call bell/phone within reach;with family/visitor present Nurse Communication: Mobility status  GO     Trevian Hayashida 10/01/2012, 12:17 PM

## 2012-10-01 NOTE — Evaluation (Signed)
Physical Therapy Evaluation Patient Details Name: Latoya [name] MRN: [id number] DOB: 07-25-1946 Today's Date: 10/01/2012 Time: 8295-6213 PT Time Calculation (min): 38 min  PT Assessment / Plan / Recommendation Clinical Impression  Pt is 66 y/o female admitted for s/p L5-S1 fusion.  Pt limited due to pain and overall drowsiness due to lack of sleep and pain meds per pt.  Pt moving well and willing to work.  Pt will benefit from acute PT services to improve overall mobility and prepare for safe d/c home with 24 hour assistance.    PT Assessment  Patient needs continued PT services    Follow Up Recommendations  Home health PT;Supervision/Assistance - 24 hour    Does the patient have the potential to tolerate intense rehabilitation      Barriers to Discharge None      Equipment Recommendations  None recommended by PT    Recommendations for Other Services     Frequency Min 5X/week    Precautions / Restrictions Precautions Precautions: Back;Fall Required Braces or Orthoses: Spinal Brace Spinal Brace: Thoracolumbosacral orthotic;Applied in sitting position   Pertinent Vitals/Pain 6-7/10 back pain which increased with mobility however did not rate      Mobility  Bed Mobility Bed Mobility: Rolling Right;Right Sidelying to Sit;Sitting - Scoot to Edge of Bed Rolling Right: 3: Mod assist;With rail Right Sidelying to Sit: 3: Mod assist;With rails Sitting - Scoot to Edge of Bed: 4: Min assist Details for Bed Mobility Assistance: VC for sequencing and hand placement Transfers Transfers: Sit to Stand;Stand to Sit;Stand Pivot Transfers Sit to Stand: 1: +2 Total assist;From bed Sit to Stand: Patient Percentage: 60% Stand to Sit: 1: +2 Total assist;To chair/3-in-1 Stand to Sit: Patient Percentage: 60% Stand Pivot Transfers: 1: +2 Total assist Stand Pivot Transfers: Patient Percentage: 60% Details for Transfer Assistance: +2 (A) to initiate transfer with cues for hand placement.   Max cues for step sequence.  Pt continues to keep LE flexed and slight hip flexion during transfer.  Max cues for upright posture and LE placement during transfer. Ambulation/Gait Ambulation/Gait Assistance: Not tested (comment)    Shoulder Instructions     Exercises     PT Diagnosis: Difficulty walking;Abnormality of gait;Generalized weakness;Acute pain  PT Problem List: Decreased activity tolerance;Decreased balance;Decreased mobility;Decreased knowledge of use of DME;Decreased knowledge of precautions;Pain PT Treatment Interventions: DME instruction;Gait training;Stair training;Functional mobility training;Therapeutic activities;Therapeutic exercise;Balance training;Neuromuscular re-education;Patient/family education   PT Goals Acute Rehab PT Goals PT Goal Formulation: With patient/family Time For Goal Achievement: 10/08/12 Potential to Achieve Goals: Good Pt will Roll Supine to Right Side: with modified independence PT Goal: Rolling Supine to Right Side - Progress: Goal set today Pt will go Supine/Side to Sit: with modified independence PT Goal: Supine/Side to Sit - Progress: Goal set today Pt will go Sit to Supine/Side: with modified independence PT Goal: Sit to Supine/Side - Progress: Goal set today Pt will go Sit to Stand: with modified independence PT Goal: Sit to Stand - Progress: Goal set today Pt will go Stand to Sit: with modified independence PT Goal: Stand to Sit - Progress: Goal set today Pt will Ambulate: >150 feet;with modified independence;with rolling walker PT Goal: Ambulate - Progress: Goal set today Pt will Go Up / Down Stairs: 3-5 stairs;with min assist;with least restrictive assistive device PT Goal: Up/Down Stairs - Progress: Goal set today  Visit Information  Last PT Received On: 10/01/12 Assistance Needed: +2 PT/OT Co-Evaluation/Treatment: Yes    Subjective Data  Subjective: "They want me  to wear the brack because of my scolosis." Patient Stated Goal:  To go home eventually   Prior Functioning  Home Living Lives With: Spouse Available Help at Discharge: Available 24 hours/day;Family Type of Home: House Home Access: Stairs to enter [employer] of Steps: 4 Entrance Stairs-Rails: Right;Left Home Layout: One level Bathroom Shower/Tub: Walk-in shower;Door [employer] Toilet: Standard Bathroom Accessibility: Yes How Accessible: Accessible via walker Home Adaptive Equipment: Walker - four wheeled;Grab bars around toilet;Grab bars in shower;Bedside commode/3-in-1;Tub transfer bench (scooter) Prior Function Level of Independence: Independent with assistive device(s) Able to Take Stairs?: Reciprically Driving: Yes Vocation: Retired Comments: required some assist to retrieve items from high shelves Communication Communication: No difficulties Dominant Hand: Right    Cognition  Overall Cognitive Status: Appears within functional limits for tasks assessed/performed Arousal/Alertness: Lethargic Orientation Level: Appears intact for tasks assessed Behavior During Session: [facility] for tasks performed    Extremity/Trunk Assessment Right Upper Extremity Assessment RUE ROM/Strength/Tone: WFL for tasks assessed RUE Sensation: WFL - Light Touch Left Upper Extremity Assessment LUE ROM/Strength/Tone: WFL for tasks assessed LUE Sensation: WFL - Light Touch Right Lower Extremity Assessment RLE ROM/Strength/Tone: Within functional levels RLE Sensation: Deficits RLE Sensation Deficits: Reports numbess/tingling prior to surgery and after Left Lower Extremity Assessment LLE ROM/Strength/Tone: Within functional levels LLE Sensation: Deficits LLE Sensation Deficits: Reports numbess/tingling prior to surgery and after   Balance Balance Balance Assessed: Yes Static Sitting Balance Static Sitting - Balance Support: Feet supported Static Sitting - Level of Assistance: 4: Min assist;5: Stand by assistance Static Sitting - Comment/# of Minutes:  Occasional min (A) due to posterior lean or too forward translation.    End of Session PT - End of Session Equipment Utilized During Treatment: Gait belt;Back brace Activity Tolerance: Patient limited by pain Patient left: in chair;with call bell/phone within reach;with family/visitor present Nurse Communication: Mobility status;Precautions  GP     Geraldyn Shain 10/01/2012, 1:20 PM Jake Shark, PT DPT [phone]

## 2012-10-01 NOTE — Clinical Social Work Note (Signed)
Clinical Social Worker received referral for possible ST-SNF placement.  Chart reviewed.  PT/OT recommending home with home health and 24 hour supervision.  Per RN, patient husband and family plan to be available 24 hours for support and assistance.  Spoke with RN Case Manager who will follow up with patient to discuss home health needs.    CSW signing off - please re consult if social work needs arise.  Macario Golds, [place] [phone]

## 2012-10-01 NOTE — Progress Notes (Signed)
Subjective: Patient reports "My back hurts."  Objective: Vital signs in last 24 hours: Temp:  [97.5 F (36.4 C)-99.1 F (37.3 C)] 98.9 F (37.2 C) (12/11 0800) Pulse Rate:  [72-119] 100  (12/11 0800) Resp:  [11-94] 17  (12/11 0800) BP: (90-143)/(34-106) 99/47 mmHg (12/11 0800) SpO2:  [85 %-100 %] 92 % (12/11 0800) Weight:  [71.782 kg (158 lb 4 oz)-74.4 kg (164 lb 0.4 oz)] 74.4 kg (164 lb 0.4 oz) (12/10 1700)  Intake/Output from previous day: 12/10 0701 - 12/11 0700 In: 4194.3 [I.V.:3294.3; IV Piggyback:900] Out: 2995 [Urine:2795; Blood:200] Intake/Output this shift: Total I/O In: 75 [I.V.:75] Out: 150 [Urine:150]  Alert, conversant. Moving BLE, stronger than yesterday. Pt reports ROM limitations with her arthritis "and swelling". Dilaudid PCA in use currently. Drseeing intact, clean, & dry.  Lab Results: No results found for this basename: WBC:2,HGB:2,HCT:2,PLT:2 in the last 72 hours BMET No results found for this basename: NA:2,K:2,CL:2,CO2:2,GLUCOSE:2,BUN:2,CREATININE:2,CALCIUM:2 in the last 72 hours  Studies/Results: Dg Lumbar Spine 2-3 Views  09/30/2012  *RADIOLOGY REPORT*  Clinical Data: Bilateral iliac crest screw insertion with revision of L5-S1 hardware.  DG C-ARM 1-60 MIN,LUMBAR SPINE - 2-3 VIEW  Technique: Five intraoperative views submitted for review after surgery.  Comparison:  05/12/2012 CT.  Findings: Iliac crest screw placed bilaterally.  Revision of lower aspect of the long segment fusion.  This can be assessed on follow- up imaging.  Surgical instrument in place.  IMPRESSION: Iliac crest screw placed bilaterally.  Revision of lower aspect of the long segment fusion.  This can be assessed on follow-up imaging.   Original Report Authenticated By: Lacy Duverney, M.D.    Dg C-arm 1-60 Min  09/30/2012  *RADIOLOGY REPORT*  Clinical Data: Bilateral iliac crest screw insertion with revision of L5-S1 hardware.  DG C-ARM 1-60 MIN,LUMBAR SPINE - 2-3 VIEW  Technique: Five  intraoperative views submitted for review after surgery.  Comparison:  05/12/2012 CT.  Findings: Iliac crest screw placed bilaterally.  Revision of lower aspect of the long segment fusion.  This can be assessed on follow- up imaging.  Surgical instrument in place.  IMPRESSION: Iliac crest screw placed bilaterally.  Revision of lower aspect of the long segment fusion.  This can be assessed on follow-up imaging.   Original Report Authenticated By: Lacy Duverney, M.D.     Assessment/Plan: Post-op day one pain as expected following extensive hardware revision.  LOS: 1 day  Mobilize with PT in TLSO. Dr. [name] will visit to address pt's request for Fentanyl PCA.   Latoya [name] 10/01/2012, 8:31 AM

## 2012-10-01 NOTE — Progress Notes (Signed)
UR COMPLETED  

## 2012-10-02 DIAGNOSIS — M47817 Spondylosis without myelopathy or radiculopathy, lumbosacral region: Secondary | ICD-10-CM

## 2012-10-02 NOTE — Progress Notes (Signed)
PT Cancellation Note  Patient Details Name: Latoya [name] MRN: [id number] DOB: 12-07-1945   Cancelled Treatment:    Reason Eval/Treat Not Completed: Other (comment) (Pt refused.  Just returned to bed from transferring floors.)  Pt politely refused therapy this PM.  Pt stated "I've been up all morning and just got back in bed after transferring floors.  I promise I will work with you tomorrow."    Halina Asano 10/02/2012, 4:08 PM

## 2012-10-02 NOTE — Progress Notes (Signed)
Courtesy f/u for Dr Delford Field  No new issues.  Have discussed with Dr [name] 12/10 Would continue Arixtra at current dose through this hospitalization If ambulating @ time of discharge, would not continue Arixtra   - leave further decisions re: long term anticoagulation to her hematologist @ [facility]   She was not on long term anticoagulation prior to this hospitalization  Please call if we can be of further assistance   Billy Fischer, MD ; [facility] service Mobile [phone].  After 5:30 PM or weekends, call [phone]

## 2012-10-02 NOTE — Progress Notes (Signed)
Continuing to progress

## 2012-10-02 NOTE — Progress Notes (Signed)
Making good progress

## 2012-10-02 NOTE — Progress Notes (Signed)
Subjective: Patient reports "My mouth is just dry. They are taking good care of me."  Objective: Vital signs in last 24 hours: Temp:  [98.7 F (37.1 C)-100.4 F (38 C)] 99.2 F (37.3 C) (12/12 0736) Pulse Rate:  [80-103] 92  (12/12 0700) Resp:  [15-24] 19  (12/12 0736) BP: (91-120)/(33-78) 106/42 mmHg (12/12 0700) SpO2:  [92 %-100 %] 95 % (12/12 0736)  Intake/Output from previous day: 12/11 0701 - 12/12 0700 In: 1821 [I.V.:1821] Out: 1575 [Urine:1575] Intake/Output this shift:    Alert, conversant. MAEW. Good strength BLE. Drsg intact, dry. Fentanyl PCA controlling pain well.   Lab Results: No results found for this basename: WBC:2,HGB:2,HCT:2,PLT:2 in the last 72 hours BMET No results found for this basename: NA:2,K:2,CL:2,CO2:2,GLUCOSE:2,BUN:2,CREATININE:2,CALCIUM:2 in the last 72 hours  Studies/Results: Dg Lumbar Spine 2-3 Views  09/30/2012  *RADIOLOGY REPORT*  Clinical Data: Bilateral iliac crest screw insertion with revision of L5-S1 hardware.  DG C-ARM 1-60 MIN,LUMBAR SPINE - 2-3 VIEW  Technique: Five intraoperative views submitted for review after surgery.  Comparison:  05/12/2012 CT.  Findings: Iliac crest screw placed bilaterally.  Revision of lower aspect of the long segment fusion.  This can be assessed on follow- up imaging.  Surgical instrument in place.  IMPRESSION: Iliac crest screw placed bilaterally.  Revision of lower aspect of the long segment fusion.  This can be assessed on follow-up imaging.   Original Report Authenticated By: Lacy Duverney, M.D.    Dg C-arm 1-60 Min  09/30/2012  *RADIOLOGY REPORT*  Clinical Data: Bilateral iliac crest screw insertion with revision of L5-S1 hardware.  DG C-ARM 1-60 MIN,LUMBAR SPINE - 2-3 VIEW  Technique: Five intraoperative views submitted for review after surgery.  Comparison:  05/12/2012 CT.  Findings: Iliac crest screw placed bilaterally.  Revision of lower aspect of the long segment fusion.  This can be assessed on follow-  up imaging.  Surgical instrument in place.  IMPRESSION: Iliac crest screw placed bilaterally.  Revision of lower aspect of the long segment fusion.  This can be assessed on follow-up imaging.   Original Report Authenticated By: Lacy Duverney, M.D.     Assessment/Plan: Improving  LOS: 2 days  Planning up to chair for breakfast, mobilize in TLSO. Pt agreeable to transfer to floor if mobilizing well.   [name] 10/02/2012, 8:03 AM

## 2012-10-02 NOTE — Progress Notes (Signed)
Patient ID: Latoya [name], female   DOB: [birth year], 66 y.o.   MRN: [id number] Up in chair, having finished breakfast a while ago. Reporting only discomfort with current position - repositioned in chair to her liking. Good strength BLE. TLSO fits well.  No a.fib. This admission. CCM signed off. Thank you for assisting. Per Dr. [name], will transfer to 4N.  Pt agrees with plan.   [name], RN, BSN

## 2012-10-03 NOTE — Progress Notes (Signed)
Physical Therapy Treatment Patient Details Name: Latoya [name] MRN: [id number] DOB: [birth year] Today's Date: 10/03/2012 Time: 4098-1191 PT Time Calculation (min): 23 min  PT Assessment / Plan / Recommendation Comments on Treatment Session  Pt progressing well, requiring only 1 assist this session for safety. Cues still required to avoid bending    Follow Up Recommendations  Home health PT;Supervision/Assistance - 24 hour     Does the patient have the potential to tolerate intense rehabilitation     Barriers to Discharge        Equipment Recommendations  None recommended by PT    Recommendations for Other Services    Frequency Min 5X/week   Plan Discharge plan remains appropriate;Frequency remains appropriate    Precautions / Restrictions Precautions Precautions: Back;Fall Precaution Comments: pt able to recall 2/3 back precautions; reeducated Required Braces or Orthoses: Spinal Brace Spinal Brace: Thoracolumbosacral orthotic;Applied in sitting position Restrictions Weight Bearing Restrictions: No   Pertinent Vitals/Pain Pain 4/10 in back.     Mobility  Bed Mobility Bed Mobility: Rolling Right;Right Sidelying to Sit;Sitting - Scoot to [place] of Bed Rolling Right: 4: Min assist;With rail Right Sidelying to Sit: 4: Min assist;With rails Sitting - Scoot to Edge of Bed: 5: Supervision Sit to Sidelying Right: 4: Min assist Details for Bed Mobility Assistance: Min assist through trunk into sitting. Cues for safe technique  Transfers Transfers: Sit to Stand;Stand to Sit;Stand Pivot Transfers Sit to Stand: 4: Min assist;With upper extremity assist;From bed;From chair/3-in-1 Stand to Sit: 4: Min assist;With upper extremity assist;To chair/3-in-1;To bed Details for Transfer Assistance: Min assist for stability. VC for safe hand placement and locking/unlocking of rollator Ambulation/Gait Ambulation/Gait Assistance: 4: Min assist Ambulation Distance (Feet): 25 Feet Assistive  device: Rollator Ambulation/Gait Assistance Details: Ambulation to/from bed to bathroom. Cues for safe distance to Rw and for upright posture as pt extremely kyphotic throughout ambulation Gait Pattern: Step-to pattern;Decreased stride length;Trunk flexed Gait velocity: decreased gait speed    Exercises     PT Diagnosis:    PT Problem List:   PT Treatment Interventions:     PT Goals Acute Rehab PT Goals PT Goal: Rolling Supine to Right Side - Progress: Progressing toward goal PT Goal: Supine/Side to Sit - Progress: Progressing toward goal PT Goal: Sit to Supine/Side - Progress: Progressing toward goal PT Goal: Sit to Stand - Progress: Progressing toward goal PT Goal: Stand to Sit - Progress: Progressing toward goal PT Goal: Ambulate - Progress: Progressing toward goal  Visit Information  Last PT Received On: 10/03/12 Assistance Needed: +1    Subjective Data      Cognition  Overall Cognitive Status: Appears within functional limits for tasks assessed/performed Arousal/Alertness: Awake/alert Orientation Level: Appears intact for tasks assessed Behavior During Session: [facility] for tasks performed    Balance     End of Session PT - End of Session Equipment Utilized During Treatment: Gait belt;Back brace Activity Tolerance: Patient tolerated treatment well Patient left: in bed;with call bell/phone within reach;with family/visitor present Nurse Communication: Mobility status   GP     Milana Kidney 10/03/2012, 3:40 PM

## 2012-10-03 NOTE — Progress Notes (Signed)
Subjective: Patient reports improving pain  Objective: Vital signs in last 24 hours: Temp:  [97.7 F (36.5 C)-101.5 F (38.6 C)] 97.7 F (36.5 C) (12/13 1038) Pulse Rate:  [97-112] 98  (12/13 1038) Resp:  [11-17] 17  (12/13 1038) BP: (98-129)/(46-81) 129/69 mmHg (12/13 1038) SpO2:  [98 %-100 %] 100 % (12/13 1038)  Intake/Output from previous day: 12/12 0701 - 12/13 0700 In: 638.1 [P.O.:240; I.V.:398.1] Out: 550 [Urine:550] Intake/Output this shift:    Physical Exam: Full strength both legs.  Back pain improving.  Lab Results: No results found for this basename: WBC:2,HGB:2,HCT:2,PLT:2 in the last 72 hours BMET No results found for this basename: NA:2,K:2,CL:2,CO2:2,GLUCOSE:2,BUN:2,CREATININE:2,CALCIUM:2 in the last 72 hours  Studies/Results: No results found.  Assessment/Plan: Improving pain control.  Mobilize with PT.  Continue PCA today.    LOS: 3 days    Latoya Heckle, MD 10/03/2012, 12:40 PM

## 2012-10-04 NOTE — Progress Notes (Signed)
Physical Therapy Treatment Patient Details Name: Latoya [name] MRN: [id number] DOB: 1946-06-23 Today's Date: 10/04/2012 Time: 4098-1191 PT Time Calculation (min): 39 min  PT Assessment / Plan / Recommendation Comments on Treatment Session  66 y.o. female with significant PMHx of 12 other back surgeries presents to [facility] for 13th back fusion surgery/revision at the L5/S1 level.  The pt is progressing better with mobility today reporting less pain overall, but continues to need constant hands on assistance to be safe with mobility.  We only walked 25' and the further we walked the more flexed her trunk became.  Her legs are weak and shakey.  She continues to need acute and f/u PT at discharge.     Follow Up Recommendations  Home health PT;Supervision/Assistance - 24 hour     Does the patient have the potential to tolerate intense rehabilitation   NA  Barriers to Discharge  none      Equipment Recommendations  None recommended by PT    Recommendations for Other Services  none  Frequency Min 5X/week   Plan Discharge plan remains appropriate;Frequency remains appropriate    Precautions / Restrictions Precautions Precautions: Back;Fall Precaution Comments: pt able to recall 3/3 back precautions; reeducated Required Braces or Orthoses: Spinal Brace Spinal Brace: Thoracolumbosacral orthotic;Applied in sitting position Restrictions Weight Bearing Restrictions: No   Pertinent Vitals/Pain 4/10 back pain, asked RN for muscle relaxer.      Mobility  Bed Mobility Rolling Right: 4: Min assist;With rail Right Sidelying to Sit: 4: Min guard;With rails;HOB flat Sitting - Scoot to Edge of Bed: 4: Min guard Details for Bed Mobility Assistance: min assist to manually assist pt to reach for right railing and to reposition hips towards the left in the bed.  Max verbal cues to stay on task.   Transfers Sit to Stand: 4: Min assist;With upper extremity assist;With armrests;From bed;From toilet Stand  to Sit: 4: Min assist;With upper extremity assist;To chair/3-in-1;To toilet Stand Pivot Transfers: 4: Min assist Details for Transfer Assistance: min assist to stabilize pt during transitions over weak legs.  Min assist for balance when backing up or transfering.   Ambulation/Gait Ambulation/Gait Assistance: 4: Min assist Ambulation Distance (Feet): 25 Feet Assistive device: Rollator Ambulation/Gait Assistance Details: 2 person assist for co-treatment one assisting pt one managing lines.  Pt is very unsteady on her feet and her trunk is very anterior of her legs due to flexed posture and forward head.  Heavy reliance on rollator for support, but assisting her you have to support her for balance and help control the rollator for steering and safety.   Gait Pattern: Step-through pattern;Shuffle;Trunk flexed Gait velocity: less than 1.8 ft/sec which puts her at risk for recurrent falls    Exercises Total Joint Exercises Ankle Circles/Pumps: AROM;Both;20 reps    PT Goals Acute Rehab PT Goals PT Goal: Rolling Supine to Right Side - Progress: Progressing toward goal PT Goal: Supine/Side to Sit - Progress: Progressing toward goal PT Goal: Sit to Stand - Progress: Progressing toward goal PT Goal: Stand to Sit - Progress: Progressing toward goal PT Goal: Ambulate - Progress: Progressing toward goal  Visit Information  Last PT Received On: 10/04/12 Assistance Needed: +1 PT/OT Co-Evaluation/Treatment: Yes    Subjective Data  Subjective: Pt reports she did much better during today's session than yesterday   Cognition  Arousal/Alertness: Awake/alert Cognition - Other Comments: pt tends to have difficulty attending to task due to continuous talking about things that don't relate to her current treatment session.  Balance  Static Sitting Balance Static Sitting - Balance Support: Feet supported Static Sitting - Level of Assistance: 5: Stand by assistance Static Sitting - Comment/# of  Minutes: supervision seated EOB to donn brace.  Educated husband re-donning technique.  Mod assist to help pt donn brace successfully (assist with straps and to center back piece).    End of Session PT - End of Session Equipment Utilized During Treatment: Back brace Activity Tolerance: Patient limited by fatigue;Patient limited by pain Patient left: in chair;with call bell/phone within reach;with family/visitor present Nurse Communication: Mobility status       Lurena Joiner B. Guenevere Roorda, PT, DPT [phone]   10/04/2012, 2:31 PM

## 2012-10-04 NOTE — Progress Notes (Signed)
Occupational Therapy Treatment Patient Details Name: Latoya [name] MRN: [id number] DOB: [birth year] Today's Date: 10/04/2012 Time: 2956-2130 OT Time Calculation (min): 39 min  OT Assessment / Plan / Recommendation Comments on Treatment Session Pt progressing toward goals     Follow Up Recommendations  Home health OT;Supervision/Assistance - 24 hour    Barriers to Discharge       Equipment Recommendations  None recommended by OT    Recommendations for Other Services    Frequency Min 2X/week   Plan Discharge plan remains appropriate    Precautions / Restrictions Precautions Precautions: Back;Fall Precaution Comments: pt able to recall 3/3 back precautions; reeducated Required Braces or Orthoses: Spinal Brace Spinal Brace: Thoracolumbosacral orthotic;Applied in sitting position Restrictions Weight Bearing Restrictions: No   Pertinent Vitals/Pain See vitals    ADL  Lower Body Dressing: Performed;Moderate assistance Where Assessed - Lower Body Dressing: Unsupported sit to stand Toilet Transfer: Performed;Minimal assistance Toilet Transfer Method: Sit to stand Toilet Transfer Equipment: Comfort height toilet;Grab bars Toileting - Clothing Manipulation and Hygiene: Performed;Min guard Where Assessed - Toileting Clothing Manipulation and Hygiene: Sit to stand from 3-in-1 or toilet Equipment Used: Back brace;Gait belt;Other (comment) (rollator) Transfers/Ambulation Related to ADLs: min assist with rollator for ambulation in room ADL Comments: Donned TLSO brace with mod assist.  Pt's husband present and OT/PT demonstrated donning technique for husband. Pt's husband will benefit from increased practice with donning brace.   Pt verbalizing difficulty with toileting hygiene, especially back peri care.  Educated pt on use of toilet aid and that it can be purchased in hospital gift shop. Husband plans to purchase toilet aid before pt discharges.    OT Diagnosis:    OT Problem List:    OT Treatment Interventions:     OT Goals ADL Goals Pt Will Perform Grooming: Independently;Standing at sink ADL Goal: Grooming - Progress: Progressing toward goals Pt Will Transfer to Toilet: with modified independence;Ambulation;with DME ADL Goal: Toilet Transfer - Progress: Progressing toward goals Pt Will Perform Toileting - Clothing Manipulation: Independently;Standing ADL Goal: Toileting - Clothing Manipulation - Progress: Progressing toward goals Pt Will Perform Toileting - Hygiene: with modified independence;with adaptive equipment;Standing at 3-in-1/toilet ADL Goal: Toileting - Hygiene - Progress: Progressing toward goals Additional ADL Goal #1: Pt will verbalize/generalize 3/3 back precautions for use with functional activities. ADL Goal: Additional Goal #1 - Progress: Progressing toward goals Additional ADL Goal #2: Pt will I'ly instruct caregiver on donning/doffing of back brace. ADL Goal: Additional Goal #2 - Progress: Progressing toward goals  Visit Information  Last OT Received On: 10/04/12 Assistance Needed: +1 PT/OT Co-Evaluation/Treatment: Yes    Subjective Data      Prior Functioning       Cognition  Arousal/Alertness: Awake/alert Cognition - Other Comments: pt tends to have difficulty attending to task due to continuous talking about things that don't relate to her current treatment session.      Mobility  Shoulder Instructions Bed Mobility Bed Mobility: Rolling Right;Right Sidelying to Sit;Sitting - Scoot to Edge of Bed Rolling Right: 4: Min assist;With rail Right Sidelying to Sit: 4: Min guard;With rails;HOB flat Sitting - Scoot to Edge of Bed: 4: Min guard Details for Bed Mobility Assistance: min assist to manually assist pt to reach for right railing and to reposition hips towards the left in the bed.  Max verbal cues to stay on task.   Transfers Transfers: Sit to Stand;Stand to Sit Sit to Stand: 4: Min assist;With upper extremity assist;With  armrests;From bed;From toilet Stand  to Sit: 4: Min assist;With upper extremity assist;To chair/3-in-1;To toilet Details for Transfer Assistance: min assist to stabilize pt during transitions over weak legs.  Min assist for balance when backing up or transfering.         Exercises     Balance Static Sitting Balance Static Sitting - Balance Support: Feet supported Static Sitting - Level of Assistance: 5: Stand by assistance Static Sitting - Comment/# of Minutes: supervision seated EOB to donn brace.  Educated husband re-donning technique.  Mod assist to help pt donn brace successfully (assist with straps and to center back piece).     End of Session OT - End of Session Equipment Utilized During Treatment: Back brace Activity Tolerance: Patient tolerated treatment well Patient left: in chair;with call bell/phone within reach;with family/visitor present Nurse Communication: Mobility status  GO    10/04/2012 Cipriano Mile OTR/L Pager [phone] Office [phone]  Cipriano Mile 10/04/2012, 2:50 PM

## 2012-10-04 NOTE — Progress Notes (Signed)
Subjective: Patient reports doing better today  Objective: Vital signs in last 24 hours: Temp:  [97.5 F (36.4 C)-98.3 F (36.8 C)] 97.9 F (36.6 C) (12/14 0554) Pulse Rate:  [91-112] 92  (12/14 0554) Resp:  [16-20] 20  (12/14 0554) BP: (96-133)/(45-69) 113/52 mmHg (12/14 0554) SpO2:  [94 %-100 %] 96 % (12/14 0554)  Intake/Output from previous day:   Intake/Output this shift:    Physical Exam: Has ambulated  Twice today.  Feeling stronger.    Lab Results: No results found for this basename: WBC:2,HGB:2,HCT:2,PLT:2 in the last 72 hours BMET No results found for this basename: NA:2,K:2,CL:2,CO2:2,GLUCOSE:2,BUN:2,CREATININE:2,CALCIUM:2 in the last 72 hours  Studies/Results: No results found.  Assessment/Plan: Will keep PCA on today and switch to 167mcg/hr fentanyl patch tomorrow with prn dilaudid. Overall, I am pleased with patient's progress.    LOS: 4 days    Dorian Heckle, MD 10/04/2012, 7:53 AM

## 2012-10-05 MED ORDER — FENTANYL 25 MCG/HR TD PT72: 100.0000 ug | MEDICATED_PATCH | TRANSDERMAL | Status: DC

## 2012-10-05 MED ORDER — FENTANYL 25 MCG/HR TD PT72
25.0000 ug | MEDICATED_PATCH | TRANSDERMAL | Status: DC
  Administered 2012-10-05: 25 ug via TRANSDERMAL
  Filled 2012-10-05: qty 1

## 2012-10-05 MED ORDER — FENTANYL 25 MCG/HR TD PT72: 125.0000 ug | MEDICATED_PATCH | TRANSDERMAL | Status: DC

## 2012-10-05 MED ORDER — FENTANYL 25 MCG/HR TD PT72: 25.0000 ug | MEDICATED_PATCH | TRANSDERMAL | Status: DC

## 2012-10-05 MED ORDER — MAGIC MOUTHWASH
15.0000 mL | Freq: Two times a day (BID) | ORAL | Status: DC | PRN
  Administered 2012-10-06: 15 mL via ORAL
  Filled 2012-10-05 (×2): qty 15

## 2012-10-05 NOTE — Progress Notes (Signed)
Physical Therapy Treatment Patient Details Name: Latoya [name] MRN: [id number] DOB: 1946/02/18 Today's Date: 10/05/2012 Time: 4098-1191 PT Time Calculation (min): 13 min  PT Assessment / Plan / Recommendation Comments on Treatment Session  Upon arrival pt exiting bathroom.  Pt refusing to increase ambulation distance due to pain + spasms.  PCA encouraged & RN notified for muscle relaxer.   During limited PT session, pt cont's to require max cues for back precautions.      Follow Up Recommendations  Home health PT;Supervision/Assistance - 24 hour     Does the patient have the potential to tolerate intense rehabilitation     Barriers to Discharge        Equipment Recommendations  None recommended by PT    Recommendations for Other Services    Frequency Min 5X/week   Plan Discharge plan remains appropriate;Frequency remains appropriate    Precautions / Restrictions Precautions Precautions: Back;Fall Precaution Comments: pt able to recall 3/3 back precautions; reeducated Required Braces or Orthoses: Spinal Brace Spinal Brace: Thoracolumbosacral orthotic;Applied in sitting position Restrictions Weight Bearing Restrictions: No   Pertinent Vitals/Pain Pt c/o back pain + spasms but did not rate.   PCA encouraged.  RN notified for muscle relaxer.      Mobility  Bed Mobility Bed Mobility: Not assessed Transfers Transfers: Sit to Stand;Stand to Sit Sit to Stand: 4: Min assist;With upper extremity assist;From bed Stand to Sit: 4: Min assist;With upper extremity assist;With armrests;To bed;To chair/3-in-1 Details for Transfer Assistance: Cues for safest hand placement, reinforcement of back precautions (bending), & body positioning before sitting.   Ambulation/Gait Ambulation/Gait Assistance: 4: Min guard Ambulation Distance (Feet): 20 Feet Assistive device: Rollator Ambulation/Gait Assistance Details: Max cues for upright posture & safe use of rollator.  Pt refusing to increase  ambulation distance.   Gait Pattern: Step-through pattern;Shuffle;Trunk flexed General Gait Details: Pt with extremely kyphotic posture.   Stairs: No Corporate treasurer: No     PT Goals Acute Rehab PT Goals PT Goal Formulation: With patient/family Potential to Achieve Goals: Good Pt will Roll Supine to Right Side: with modified independence Pt will go Supine/Side to Sit: with modified independence Pt will go Sit to Supine/Side: with modified independence Pt will go Sit to Stand: with modified independence PT Goal: Sit to Stand - Progress: Progressing toward goal Pt will go Stand to Sit: with modified independence PT Goal: Stand to Sit - Progress: Progressing toward goal Pt will Ambulate: >150 feet;with modified independence;with rolling walker PT Goal: Ambulate - Progress: Progressing toward goal Pt will Go Up / Down Stairs: 3-5 stairs;with min assist;with least restrictive assistive device  Visit Information  Last PT Received On: 10/05/12 Assistance Needed: +1    Subjective Data  Subjective: "I just can't do it today"   Cognition  Overall Cognitive Status: Appears within functional limits for tasks assessed/performed Arousal/Alertness: Awake/alert Orientation Level: Appears intact for tasks assessed Behavior During Session: [facility] for tasks performed    Balance     End of Session PT - End of Session Equipment Utilized During Treatment: Back brace Activity Tolerance: Patient limited by pain Patient left: in chair;with call bell/phone within reach;with family/visitor present    Verdell Face, PTA [phone] 10/05/2012

## 2012-10-05 NOTE — Progress Notes (Signed)
Subjective: Patient reports didn't sleep well, but generally feels back pain is improving.  Objective: Vital signs in last 24 hours: Temp:  [97.3 F (36.3 C)-99.1 F (37.3 C)] 99.1 F (37.3 C) (12/15 0600) Pulse Rate:  [85-106] 85  (12/15 0600) Resp:  [14-20] 14  (12/15 0600) BP: (118-140)/(42-77) 122/42 mmHg (12/15 0600) SpO2:  [94 %-99 %] 99 % (12/15 0743)  Intake/Output from previous day:   Intake/Output this shift:    Physical Exam: Strength in lower extremities good.  Dressing CDI.  Lab Results: No results found for this basename: WBC:2,HGB:2,HCT:2,PLT:2 in the last 72 hours BMET No results found for this basename: NA:2,K:2,CL:2,CO2:2,GLUCOSE:2,BUN:2,CREATININE:2,CALCIUM:2 in the last 72 hours  Studies/Results: No results found.  Assessment/Plan: D/C PCA.  Increase fentanyl patch to 125 mcg QH.  Oral dilaudid as per home.  Continue to mobilize.  D/C home when able to get around better.    LOS: 5 days    Dorian Heckle, MD 10/05/2012, 8:06 AM

## 2012-10-05 NOTE — Progress Notes (Signed)
PCA fentanyl  Pump discontinued at 12:35pm  Per  MD order and 150 mcg   Wasted and witness by a second Charity fundraiser . Pt had a total 125 Mcg  Intra dermal duragesic patches applied to rt anterior upper thigh. Pt  Stated her pain is being controlled at this time . Will continue to manage.

## 2012-10-06 NOTE — Discharge Summary (Signed)
Physician Discharge Summary  Patient ID: Latoya [name] MRN: [id number] DOB/AGE: 65-May-1947 65 y.o.  Admit date: 09/30/2012 Discharge date: 10/06/2012  Admission Diagnoses: Non union L 5/S1 , Intractable low back pain, thoracolumbar scoliosis, spondylosis    Discharge Diagnoses: Non union L 5/S1 , Intractable low back pain, thoracolumbar scoliosis, spondylosis s/p POSTERIOR LUMBAR FUSION 1 LEVEL (N/A) - Revision of Lumbosacral fusion with iliac screws and posterior lateral arthrodesis with pedicle screw fixation   Active Problems:  * No active hospital problems. *    Discharged Condition: good  Hospital Course: Latoya [name] was admitted for surgery on 09-30-12 with Dx Non union L 5/S1 , Intractable low back pain, thoracolumbar scoliosis, spondylosis.  Following uncomplicated revision and fusion L4-S1-Iliac crests, she recovered in Neuro PACU.  She then transferred to Neuro ICU for observation before transferring to [facility] for nursing care and therapies.     Consults: Critical Care  Significant Diagnostic Studies: radiology: X-Ray: intra-operative  Treatments: surgery: POSTERIOR LUMBAR FUSION 1 LEVEL (N/A) - Revision of Lumbosacral fusion with iliac screws and posterior lateral arthrodesis with pedicle screw fixation   Discharge Exam: Blood pressure 105/59, pulse 92, temperature 97.5 F (36.4 C), temperature source Oral, resp. rate 20, height 5\' 2"  (1.575 m), weight 74.4 kg (164 lb 0.4 oz), SpO2 99.00%. Alert, conversant, smiling. Pain well controlled on current regimen. Good strength BLE. TLSO fits well. Incision without erythema, swelling, drainage. Steris's intact. Drsg dry.    Disposition: 01-Home or Self CareHHPT.  Pt has Rx's at home from Dr. Vear Clock, pain mgmt. Pt has DME at home. Pt verbalizes understanding of d/c instructions. She agrees to call office to set up f/u appt with Dr. [name] in January for 3-4 wk post-op.    Discharge Orders    Future Appointments:  Provider: Department: Dept Phone: Center:   11/24/2012 1:30 PM [facility] [phone] None       Medication List     As of 10/06/2012  8:08 AM    ASK your doctor about these medications         albuterol (2.5 MG/3ML) 0.083% nebulizer solution   Commonly known as: PROVENTIL   Take 2.5 mg by nebulization every 6 (six) hours as needed. For shortness of breath/wheezing      aspirin 81 MG EC tablet   Take 81 mg by mouth daily.      ASTELIN 137 MCG/SPRAY nasal spray   Generic drug: azelastine   Place 1 spray into the nose 2 (two) times daily as needed. Use in each nostril as directed  For allergies      atorvastatin 20 MG tablet   Commonly known as: LIPITOR   Take 20 mg by mouth daily.      beclomethasone 80 MCG/ACT inhaler   Commonly known as: QVAR   Inhale 2 puffs into the lungs 3 (three) times daily.      Calcium Carbonate-Vitamin D 600-400 MG-UNIT per tablet   Take 1 tablet by mouth 2 (two) times daily.      CLARITIN-D 12 HOUR PO   Take 1 tablet by mouth 2 (two) times daily.      cyclobenzaprine 10 MG tablet   Commonly known as: FLEXERIL   Take 10 mg by mouth 3 (three) times daily as needed. For severe muscle pain      diazepam 5 MG tablet   Commonly known as: VALIUM   Take 5 mg by mouth at bedtime.      diclofenac-misoprostol 75-200  MG-MCG per tablet   Commonly known as: ARTHROTEC 75   Take 1 tablet by mouth 2 (two) times daily. On hold for now due to low iron      docusate sodium 100 MG capsule   Commonly known as: COLACE   Take 200 mg by mouth 2 (two) times daily.      esomeprazole 40 MG capsule   Commonly known as: NEXIUM   Take 40 mg by mouth 2 (two) times daily.      fentaNYL 100 MCG/HR   Commonly known as: DURAGESIC - dosed mcg/hr   Place 1 patch onto the skin every other day.      FEOSOL 325 (65 FE) MG tablet   Generic drug: ferrous sulfate   Take 325 mg by mouth daily with breakfast.      Glucosamine-Chondroitin 750-600 MG Tabs   Take  1 tablet by mouth 2 (two) times daily.      HYDROmorphone 4 MG tablet   Commonly known as: DILAUDID   Take 4-8 mg by mouth every 4 (four) hours as needed. For pain      levothyroxine 75 MCG tablet   Commonly known as: SYNTHROID, LEVOTHROID   Take 75 mcg by mouth daily before breakfast.      lidocaine 5 %   Commonly known as: LIDODERM   Place 1 patch onto the skin as needed. Remove & Discard patch within 12 hours or as directed by MD      metaxalone 800 MG tablet   Commonly known as: SKELAXIN   Take 800 mg by mouth every 8 (eight) hours as needed. For muscle pain      metoprolol 50 MG tablet   Commonly known as: LOPRESSOR   Take 50 mg by mouth 2 (two) times daily.      montelukast 10 MG tablet   Commonly known as: SINGULAIR   Take 10 mg by mouth at bedtime.      multivitamin with minerals Tabs   Take 1 tablet by mouth daily.      NEURONTIN 600 MG tablet   Generic drug: gabapentin   Take 900 mg by mouth at bedtime.      nitroGLYCERIN 0.4 MG SL tablet   Commonly known as: NITROSTAT   Place 0.4 mg under the tongue every 5 (five) minutes as needed. For chest pain      triamcinolone 55 MCG/ACT nasal inhaler   Commonly known as: NASACORT   Place 2 sprays into the nose 2 (two) times daily.         Signed: [name] 10/06/2012, 8:08 AM

## 2012-10-06 NOTE — Progress Notes (Signed)
Occupational Therapy Treatment Patient Details Name: Latoya [name] MRN: [id number] DOB: [birth year] Today's Date: 10/06/2012 Time: 4098-1191 OT Time Calculation (min): 24 min  OT Assessment / Plan / Recommendation Comments on Treatment Session All education completed with this patient, acute OT will sign off.    Follow Up Recommendations  Home health OT;Supervision/Assistance - 24 hour       Equipment Recommendations  None recommended by OT       Frequency Min 2X/week   Plan Discharge plan remains appropriate    Precautions / Restrictions Precautions Precautions: Back;Fall Precaution Comments: Pt able to verbalize all 3 back precautions but requires occassional reinforcement of precautions with functional mobility.   Required Braces or Orthoses: Spinal Brace Spinal Brace: Thoracolumbosacral orthotic;Applied in sitting position Restrictions Weight Bearing Restrictions: No       ADL  Grooming: Performed;Wash/dry hands;Supervision/safety Where Assessed - Grooming: Unsupported standing Toilet Transfer: Research scientist (life sciences) Method: Sit to Barista: Comfort height toilet;Grab bars Toileting - Architect and Hygiene: Performed;Supervision/safety Where Assessed - Engineer, mining and Hygiene: Sit to stand from 3-in-1 or toilet Equipment Used: Back brace (rollator) Transfers/Ambulation Related to ADLs: S with rollator in room ADL Comments: Pt reports that husband got her a Actuary      OT Goals ADL Goals ADL Goal: Grooming - Progress: Progressing toward goals ADL Goal: Statistician - Progress: Progressing toward goals ADL Goal: Toileting - Clothing Manipulation - Progress: Progressing toward goals ADL Goal: Toileting - Hygiene - Progress: Progressing toward goals  Visit Information  Last OT Received On: 10/06/12 Assistance Needed: +1    Subjective Data  Subjective: I believe we (pt and  husband) have it all figured out (BADLs) now.      Cognition  Overall Cognitive Status: Appears within functional limits for tasks assessed/performed Arousal/Alertness: Awake/alert Orientation Level: Appears intact for tasks assessed Behavior During Session: [facility] for tasks performed Cognition - Other Comments: Pt loses focus fairly quickly but feel this is her personality    Mobility   Transfers Transfers: Sit to Stand;Stand to Sit Sit to Stand: 5: Supervision;With upper extremity assist;From bed Stand to Sit: 5: Supervision;With upper extremity assist;To bed             End of Session OT - End of Session Equipment Utilized During Treatment: Back brace (rollator) Activity Tolerance: Patient tolerated treatment well Patient left:  (sitting EOB with PT in room)       Evette Georges [phone] 10/06/2012, 1:45 PM

## 2012-10-06 NOTE — Progress Notes (Signed)
DC home

## 2012-10-06 NOTE — Progress Notes (Signed)
Subjective: Patient reports "I'm ready to go home!"   Objective: Vital signs in last 24 hours: Temp:  [97.5 F (36.4 C)-98 F (36.7 C)] 97.5 F (36.4 C) (12/16 0520) Pulse Rate:  [91-99] 92  (12/16 0520) Resp:  [14-20] 20  (12/16 0520) BP: (105-131)/(56-75) 105/59 mmHg (12/16 0520) SpO2:  [98 %-100 %] 99 % (12/16 0520)  Intake/Output from previous day: 12/15 0701 - 12/16 0700 In: 660 [P.O.:660] Out: -  Intake/Output this shift:    Alert, conversant, smiling. Pain well controlled on current regimen. Good strength BLE. TLSO fits well. Incision without erythema, swelling, drainage. Steris's intact. Drsg dry.  Lab Results: No results found for this basename: WBC:2,HGB:2,HCT:2,PLT:2 in the last 72 hours BMET No results found for this basename: NA:2,K:2,CL:2,CO2:2,GLUCOSE:2,BUN:2,CREATININE:2,CALCIUM:2 in the last 72 hours  Studies/Results: No results found.  Assessment/Plan: Improved  LOS: 6 days  Per Dr. [name], d/c to home.  HHPT.  Pt has Rx's at home from Dr. Vear Clock, pain mgmt. Pt has DME at home. Pt verbalizes understanding of d/c instructions. She agrees to call office to set up f/u appt with Dr. [name] in January for 3-4 wk post-op.   [name] 10/06/2012, 8:00 AM

## 2012-10-06 NOTE — Progress Notes (Signed)
Physical Therapy Treatment Patient Details Name: Latoya [name] MRN: [id number] DOB: [birth year] Today's Date: 10/06/2012 Time: 2130-8657 PT Time Calculation (min): 18 min  PT Assessment / Plan / Recommendation Comments on Treatment Session  Pt reports pain is better controlled today.  More willing to participate & tolerated fairly well.  Able to perform stairs with Min Guard (A).  Cont's to require cueing to increase tall posture.      Follow Up Recommendations  Home health PT;Supervision/Assistance - 24 hour     Does the patient have the potential to tolerate intense rehabilitation     Barriers to Discharge        Equipment Recommendations  None recommended by PT    Recommendations for Other Services    Frequency Min 5X/week   Plan Discharge plan remains appropriate;Frequency remains appropriate    Precautions / Restrictions Precautions Precautions: Back;Fall Precaution Comments: Pt able to verbalize all 3 back precautions but requires occassional reinforcement of precautions with functional mobility.   Required Braces or Orthoses: Spinal Brace Spinal Brace: Thoracolumbosacral orthotic;Applied in sitting position Restrictions Weight Bearing Restrictions: No       Mobility  Bed Mobility Bed Mobility: Not assessed Details for Bed Mobility Assistance: Pt sitting EOB with OT present upon arrival.   Transfers Transfers: Sit to Stand;Stand to Sit Sit to Stand: 4: Min guard;With upper extremity assist;From bed;Other (comment) (rollator) Stand to Sit: 4: Min guard;With upper extremity assist;With armrests;To chair/3-in-1;Other (comment) (rollator) Details for Transfer Assistance: No physical (A) needed but requires cues for safest hand placement & encouragement to minimize trunk flexion.   Ambulation/Gait Ambulation/Gait Assistance: 4: Min guard Ambulation Distance (Feet): 60 Feet Assistive device: Rollator Ambulation/Gait Assistance Details: Cont's to present with  significant trunk flexion but pt & husband report flexed posture was much more extreme PTA.  Pt states she is aware of flexed posture & that she is working on more upright stance.   Gait Pattern: Step-through pattern;Decreased stride length;Trunk flexed General Gait Details: Cont's with significant trunk flexion however pt & husband report is has improved since PTA.   Stairs: Yes Stairs Assistance: 4: Min guard Stair Management Technique: Two rails;Step to pattern;Forwards Number of Stairs: 3  Wheelchair Mobility Wheelchair Mobility: No      PT Goals Acute Rehab PT Goals Time For Goal Achievement: 10/08/12 Potential to Achieve Goals: Good Pt will Roll Supine to Right Side: with modified independence Pt will go Supine/Side to Sit: with modified independence Pt will go Sit to Supine/Side: with modified independence Pt will go Sit to Stand: with modified independence PT Goal: Sit to Stand - Progress: Progressing toward goal Pt will go Stand to Sit: with modified independence PT Goal: Stand to Sit - Progress: Progressing toward goal Pt will Ambulate: >150 feet;with modified independence;with rolling walker PT Goal: Ambulate - Progress: Progressing toward goal Pt will Go Up / Down Stairs: 3-5 stairs;with min assist;with least restrictive assistive device PT Goal: Up/Down Stairs - Progress: Met  Visit Information  Last PT Received On: 10/06/12 Assistance Needed: +1    Subjective Data  Subjective: "Im so excited about going home"   Cognition  Overall Cognitive Status: Appears within functional limits for tasks assessed/performed Arousal/Alertness: Awake/alert Orientation Level: Appears intact for tasks assessed Behavior During Session: [facility] for tasks performed Cognition - Other Comments: Pt loses focus fairly quickly but feel this is her personality    Balance     End of Session PT - End of Session Equipment Utilized During Treatment: Gait belt;Back  brace Activity Tolerance:  Patient tolerated treatment well Patient left: in chair;with call bell/phone within reach;with family/visitor present Nurse Communication: Mobility status    Verdell Face, [place] [phone] 10/06/2012

## 2012-10-06 NOTE — Progress Notes (Signed)
   CARE MANAGEMENT NOTE 10/06/2012  Patient:  Latoya [name], Latoya [name]   Account Number:  [id number]  Date Initiated:  10/03/2012  Documentation initiated by:  [facility]  Subjective/Objective Assessment:   Admitted postop L5-S1 revision     Action/Plan:   Anticipated DC Date:  10/06/2012   Anticipated DC Plan:  HOME W HOME HEALTH SERVICES      DC Planning Services  CM consult      [facility] Choice  HOME HEALTH   Choice offered to / List presented to:  C-3 Spouse        HH arranged  HH-2 PT      HH agency  [facility]   Status of service:  Completed, signed off Medicare Important Message given?   (If response is "NO", the following Medicare IM given date fields will be blank) Date Medicare IM given:   Date Additional Medicare IM given:    Discharge Disposition:  HOME W HOME HEALTH SERVICES  Per UR Regulation:    If discussed at Long Length of Stay Meetings, dates discussed:    Comments:  10/06/2012 1100 NCM spoke to pt's husband and offered choice for [facility]. Pt's husband requested [facility] for [facility]. NCM contacted [facility] rep, Mary for [facility]. Husband states pt has RW and bedside commode at home. Isidoro Donning RN CCM Case Mgmt phone [phone]

## 2012-10-07 NOTE — Discharge Summary (Signed)
Patient improved nicely following lumbar revision surgery

## 2012-10-10 DIAGNOSIS — Z5189 Encounter for other specified aftercare: Secondary | ICD-10-CM | POA: Diagnosis not present

## 2012-10-10 DIAGNOSIS — M4130 Thoracogenic scoliosis, site unspecified: Secondary | ICD-10-CM | POA: Diagnosis not present

## 2012-10-10 DIAGNOSIS — R269 Unspecified abnormalities of gait and mobility: Secondary | ICD-10-CM | POA: Diagnosis not present

## 2012-10-10 DIAGNOSIS — M47817 Spondylosis without myelopathy or radiculopathy, lumbosacral region: Secondary | ICD-10-CM | POA: Diagnosis not present

## 2012-10-10 DIAGNOSIS — M545 Low back pain: Secondary | ICD-10-CM | POA: Diagnosis not present

## 2012-10-10 DIAGNOSIS — I10 Essential (primary) hypertension: Secondary | ICD-10-CM | POA: Diagnosis not present

## 2012-10-21 DIAGNOSIS — M545 Low back pain: Secondary | ICD-10-CM | POA: Diagnosis not present

## 2012-10-21 DIAGNOSIS — I10 Essential (primary) hypertension: Secondary | ICD-10-CM | POA: Diagnosis not present

## 2012-10-21 DIAGNOSIS — Z5189 Encounter for other specified aftercare: Secondary | ICD-10-CM | POA: Diagnosis not present

## 2012-10-21 DIAGNOSIS — M4130 Thoracogenic scoliosis, site unspecified: Secondary | ICD-10-CM | POA: Diagnosis not present

## 2012-10-21 DIAGNOSIS — R269 Unspecified abnormalities of gait and mobility: Secondary | ICD-10-CM | POA: Diagnosis not present

## 2012-10-21 DIAGNOSIS — M47817 Spondylosis without myelopathy or radiculopathy, lumbosacral region: Secondary | ICD-10-CM | POA: Diagnosis not present

## 2012-10-24 DIAGNOSIS — M4130 Thoracogenic scoliosis, site unspecified: Secondary | ICD-10-CM | POA: Diagnosis not present

## 2012-10-24 DIAGNOSIS — R269 Unspecified abnormalities of gait and mobility: Secondary | ICD-10-CM | POA: Diagnosis not present

## 2012-10-24 DIAGNOSIS — M545 Low back pain: Secondary | ICD-10-CM | POA: Diagnosis not present

## 2012-10-24 DIAGNOSIS — Z5189 Encounter for other specified aftercare: Secondary | ICD-10-CM | POA: Diagnosis not present

## 2012-10-24 DIAGNOSIS — M47817 Spondylosis without myelopathy or radiculopathy, lumbosacral region: Secondary | ICD-10-CM | POA: Diagnosis not present

## 2012-10-24 DIAGNOSIS — I10 Essential (primary) hypertension: Secondary | ICD-10-CM | POA: Diagnosis not present

## 2012-10-30 DIAGNOSIS — M47817 Spondylosis without myelopathy or radiculopathy, lumbosacral region: Secondary | ICD-10-CM | POA: Diagnosis not present

## 2012-10-30 DIAGNOSIS — R269 Unspecified abnormalities of gait and mobility: Secondary | ICD-10-CM | POA: Diagnosis not present

## 2012-10-30 DIAGNOSIS — M4130 Thoracogenic scoliosis, site unspecified: Secondary | ICD-10-CM | POA: Diagnosis not present

## 2012-10-30 DIAGNOSIS — Z5189 Encounter for other specified aftercare: Secondary | ICD-10-CM | POA: Diagnosis not present

## 2012-10-30 DIAGNOSIS — I10 Essential (primary) hypertension: Secondary | ICD-10-CM | POA: Diagnosis not present

## 2012-10-30 DIAGNOSIS — M545 Low back pain: Secondary | ICD-10-CM | POA: Diagnosis not present

## 2012-11-04 DIAGNOSIS — R269 Unspecified abnormalities of gait and mobility: Secondary | ICD-10-CM | POA: Diagnosis not present

## 2012-11-04 DIAGNOSIS — M545 Low back pain: Secondary | ICD-10-CM | POA: Diagnosis not present

## 2012-11-04 DIAGNOSIS — Z5189 Encounter for other specified aftercare: Secondary | ICD-10-CM | POA: Diagnosis not present

## 2012-11-04 DIAGNOSIS — I10 Essential (primary) hypertension: Secondary | ICD-10-CM | POA: Diagnosis not present

## 2012-11-04 DIAGNOSIS — M4130 Thoracogenic scoliosis, site unspecified: Secondary | ICD-10-CM | POA: Diagnosis not present

## 2012-11-04 DIAGNOSIS — M47817 Spondylosis without myelopathy or radiculopathy, lumbosacral region: Secondary | ICD-10-CM | POA: Diagnosis not present

## 2012-11-05 DIAGNOSIS — IMO0002 Reserved for concepts with insufficient information to code with codable children: Secondary | ICD-10-CM | POA: Diagnosis not present

## 2012-11-07 DIAGNOSIS — R269 Unspecified abnormalities of gait and mobility: Secondary | ICD-10-CM | POA: Diagnosis not present

## 2012-11-07 DIAGNOSIS — M545 Low back pain: Secondary | ICD-10-CM | POA: Diagnosis not present

## 2012-11-07 DIAGNOSIS — M47817 Spondylosis without myelopathy or radiculopathy, lumbosacral region: Secondary | ICD-10-CM | POA: Diagnosis not present

## 2012-11-07 DIAGNOSIS — M4130 Thoracogenic scoliosis, site unspecified: Secondary | ICD-10-CM | POA: Diagnosis not present

## 2012-11-07 DIAGNOSIS — I10 Essential (primary) hypertension: Secondary | ICD-10-CM | POA: Diagnosis not present

## 2012-11-07 DIAGNOSIS — Z5189 Encounter for other specified aftercare: Secondary | ICD-10-CM | POA: Diagnosis not present

## 2012-11-10 DIAGNOSIS — R269 Unspecified abnormalities of gait and mobility: Secondary | ICD-10-CM | POA: Diagnosis not present

## 2012-11-10 DIAGNOSIS — M4130 Thoracogenic scoliosis, site unspecified: Secondary | ICD-10-CM | POA: Diagnosis not present

## 2012-11-10 DIAGNOSIS — M545 Low back pain: Secondary | ICD-10-CM | POA: Diagnosis not present

## 2012-11-10 DIAGNOSIS — I10 Essential (primary) hypertension: Secondary | ICD-10-CM | POA: Diagnosis not present

## 2012-11-10 DIAGNOSIS — Z5189 Encounter for other specified aftercare: Secondary | ICD-10-CM | POA: Diagnosis not present

## 2012-11-10 DIAGNOSIS — M47817 Spondylosis without myelopathy or radiculopathy, lumbosacral region: Secondary | ICD-10-CM | POA: Diagnosis not present

## 2012-11-26 ENCOUNTER — Encounter (INDEPENDENT_AMBULATORY_CARE_PROVIDER_SITE_OTHER): Payer: Medicare Other

## 2012-11-26 DIAGNOSIS — I6529 Occlusion and stenosis of unspecified carotid artery: Secondary | ICD-10-CM | POA: Diagnosis not present

## 2012-11-28 DIAGNOSIS — R269 Unspecified abnormalities of gait and mobility: Secondary | ICD-10-CM | POA: Diagnosis not present

## 2012-11-28 DIAGNOSIS — Z5189 Encounter for other specified aftercare: Secondary | ICD-10-CM | POA: Diagnosis not present

## 2012-11-28 DIAGNOSIS — I10 Essential (primary) hypertension: Secondary | ICD-10-CM | POA: Diagnosis not present

## 2012-11-28 DIAGNOSIS — M545 Low back pain: Secondary | ICD-10-CM | POA: Diagnosis not present

## 2012-11-28 DIAGNOSIS — M47817 Spondylosis without myelopathy or radiculopathy, lumbosacral region: Secondary | ICD-10-CM | POA: Diagnosis not present

## 2012-11-28 DIAGNOSIS — M4130 Thoracogenic scoliosis, site unspecified: Secondary | ICD-10-CM | POA: Diagnosis not present

## 2012-12-25 DIAGNOSIS — E039 Hypothyroidism, unspecified: Secondary | ICD-10-CM | POA: Diagnosis not present

## 2012-12-25 DIAGNOSIS — R7301 Impaired fasting glucose: Secondary | ICD-10-CM | POA: Diagnosis not present

## 2012-12-25 DIAGNOSIS — E785 Hyperlipidemia, unspecified: Secondary | ICD-10-CM | POA: Diagnosis not present

## 2012-12-25 DIAGNOSIS — M899 Disorder of bone, unspecified: Secondary | ICD-10-CM | POA: Diagnosis not present

## 2012-12-25 DIAGNOSIS — D649 Anemia, unspecified: Secondary | ICD-10-CM | POA: Diagnosis not present

## 2012-12-29 DIAGNOSIS — IMO0002 Reserved for concepts with insufficient information to code with codable children: Secondary | ICD-10-CM | POA: Diagnosis not present

## 2013-01-02 ENCOUNTER — Other Ambulatory Visit: Payer: Self-pay

## 2013-01-02 MED ORDER — ATORVASTATIN CALCIUM 20 MG PO TABS: 20.0000 mg | ORAL_TABLET | Freq: Every day | ORAL | Status: DC

## 2013-01-12 DIAGNOSIS — E039 Hypothyroidism, unspecified: Secondary | ICD-10-CM | POA: Diagnosis not present

## 2013-01-12 DIAGNOSIS — R7301 Impaired fasting glucose: Secondary | ICD-10-CM | POA: Diagnosis not present

## 2013-01-12 DIAGNOSIS — E785 Hyperlipidemia, unspecified: Secondary | ICD-10-CM | POA: Diagnosis not present

## 2013-01-12 DIAGNOSIS — I2510 Atherosclerotic heart disease of native coronary artery without angina pectoris: Secondary | ICD-10-CM | POA: Diagnosis not present

## 2013-01-12 DIAGNOSIS — Z23 Encounter for immunization: Secondary | ICD-10-CM | POA: Diagnosis not present

## 2013-01-15 ENCOUNTER — Ambulatory Visit (INDEPENDENT_AMBULATORY_CARE_PROVIDER_SITE_OTHER): Payer: Medicare Other | Admitting: Emergency Medicine

## 2013-01-15 VITALS — BP 118/88 | HR 86 | Temp 98.5°F | Resp 16 | Wt 172.0 lb

## 2013-01-15 DIAGNOSIS — J018 Other acute sinusitis: Secondary | ICD-10-CM

## 2013-01-15 MED ORDER — AMOXICILLIN-POT CLAVULANATE 875-125 MG PO TABS: 1.0000 | ORAL_TABLET | Freq: Two times a day (BID) | ORAL | Status: DC

## 2013-01-15 NOTE — Patient Instructions (Addendum)

## 2013-01-15 NOTE — Progress Notes (Signed)
[facility] [address], [place] [address] [phone]  Date:  01/15/2013   Name:  Latoya [name] [facility] [address]   DOB:  Apr 03, 1946   MRN:  [id number]  PCP:  Thayer Headings, MD    Chief Complaint: Sinusitis and Headache   History of Present Illness:  Latoya [name] is a 67 y.o. very pleasant female patient who presents with the following:  History of recurrent sinusitis probably related to her cleft lip-palate repair.  5 day history of nasal congestion and purulent drainage.  Pressure and pain in both maxillary sinuses.  No fever but has marked chills this morning.  No cough, wheezing or shortness of breath.  Has some sore throat. No nausea or vomiting.  No rash.  No improvement with over the counter medications or other home remedies.  Denies other complaint or health concern today. Exacerbation of pain in joints and muscles.  Patient Active Problem List  Diagnosis  . HYPERLIPIDEMIA  . ANEMIA, IRON DEFICIENCY, MICROCYTIC  . ANEMIA-UNSPECIFIED  . PRIMARY HYPERCOAGULABLE STATE  . HYPERTENSION  . CORONARY HEART DISEASE  . CAD, NATIVE VESSEL  . PULMONARY EMBOLISM  . PAROXYSMAL ATRIAL FIBRILLATION  . CAROTID ARTERY DISEASE  . DVT  . EXTRINSIC ASTHMA, UNSPECIFIED  . ASTHMA  . GERD  . SPONDYLOSIS, LUMBAR  . BACK PAIN, CHRONIC  . MURMUR  . CAROTID BRUIT, LEFT  . ABDOMINAL PAIN -GENERALIZED  . PAD (peripheral artery disease)    Past Medical History  Diagnosis Date  . Asthma     extrinsic; moderate, persistant, nml spirometry 2010, nml CXR 1/08  . Pulmonary embolism     related to back surgery with prior coumadin use, now off  . DVT (deep venous thrombosis)   . PAF (paroxysmal atrial fibrillation)     not on coumadin therapy  . HLD (hyperlipidemia)   . CAD (coronary artery disease)     50% mid LAD, 80% ostial D1 and moderate 80% mid circ by vath 2003  . Antiphospholipid syndrome     with hypercoagulable state  . Coronary heart disease   . Drug allergy     heparin/lovenox  . Erosive gastritis 1994  . Anemia, iron deficiency   . HTN (hypertension)     [name] at [facility], manages pt.   . Complication of anesthesia     cardiac arrest- in OR, at age 8 (19)y.o. during Scalenotomy   . Anxiety   . Hypothyroidism   . Bladder troubles     REPORTS INFECTIONS ON OCCASION DUE TO URETHRA MEATUS STRICTURE AT BIRTH   . H/O hiatal hernia   . Spondylosis, lumbosacral     ARTHRITIS- OA   . Cancer     basal cell removed fr. L arm   . Blood dyscrasia   . Liver spot     cyst- - no biopsy, but told that its benign   . GERD (gastroesophageal reflux disease)     Past Surgical History  Procedure Laterality Date  . Back surgery      x12 cervical and lumbar thoracic spine surgery  . Total knee arthroplasty      left  . Cleft lip and palate repair      67 yo   . Hysterectomy    . Spina bifida repair      67 yo   . Cardiac catheterization      2005  . Joint replacement    . Tonsillectomy    . Breast surgery  all benign cysts x3   . Abdominal hysterectomy      partial abdominal -1998    History  Substance Use Topics  . Smoking status: Former Smoker    Quit date: 10/22/1972  . Smokeless tobacco: Not on file     Comment: no smoking   . Alcohol Use: No    Family History  Problem Relation Age of Onset  . Coronary artery disease Sister     MI     Allergies  Allergen Reactions  . Amoxicillin-Pot Clavulanate Diarrhea  . Enoxaparin Sodium     REACTION: thrombocytopenia  . Heparin     REACTION: thrombocytopenia  . Lactose Intolerance (Gi)   . Other     Sensitive to dye in Betadine & Chlorohexadine   . Pork-Derived Products   . Povidone     Sensitivity- but if its wiped off she is able to tolerate betadine   . Indomethacin Rash    Medication list has been reviewed and updated.  Current Outpatient Prescriptions on File Prior to Visit  Medication Sig Dispense Refill  . albuterol (PROVENTIL) (2.5 MG/3ML) 0.083% nebulizer  solution Take 2.5 mg by nebulization every 6 (six) hours as needed. For shortness of breath/wheezing      . aspirin 81 MG EC tablet Take 81 mg by mouth daily.       Latoya Kitchen atorvastatin (LIPITOR) 20 MG tablet Take 1 tablet (20 mg total) by mouth daily.  30 tablet  5  . azelastine (ASTELIN) 137 MCG/SPRAY nasal spray Place 1 spray into the nose 2 (two) times daily as needed. Use in each nostril as directed For allergies      . Calcium Carbonate-Vitamin D 600-400 MG-UNIT per tablet Take 1 tablet by mouth 2 (two) times daily.        . cyclobenzaprine (FLEXERIL) 10 MG tablet Take 10 mg by mouth 3 (three) times daily as needed. For severe muscle pain      . diazepam (VALIUM) 5 MG tablet Take 5 mg by mouth at bedtime.       . diclofenac-misoprostol (ARTHROTEC 75) 75-0.2 MG per tablet Take 1 tablet by mouth 2 (two) times daily. On hold for now due to low iron      . docusate sodium (COLACE) 100 MG capsule Take 200 mg by mouth 2 (two) times daily.       Latoya Kitchen esomeprazole (NEXIUM) 40 MG capsule Take 40 mg by mouth 2 (two) times daily.       . fentaNYL (DURAGESIC - DOSED MCG/HR) 100 MCG/HR Place 1 patch onto the skin every other day.       . ferrous sulfate (FEOSOL) 325 (65 FE) MG tablet Take 325 mg by mouth daily with breakfast.      . gabapentin (NEURONTIN) 600 MG tablet Take 900 mg by mouth at bedtime.       . Glucosamine-Chondroitin 750-600 MG TABS Take 1 tablet by mouth 2 (two) times daily.        Latoya Kitchen HYDROmorphone (DILAUDID) 4 MG tablet Take 4-8 mg by mouth every 4 (four) hours as needed. For pain      . levothyroxine (SYNTHROID, LEVOTHROID) 75 MCG tablet Take 75 mcg by mouth daily before breakfast.       . lidocaine (LIDODERM) 5 % Place 1 patch onto the skin as needed. Remove & Discard patch within 12 hours or as directed by MD      . Loratadine-Pseudoephedrine (CLARITIN-D 12 HOUR PO) Take 1 tablet by mouth 2 (  two) times daily.       . metaxalone (SKELAXIN) 800 MG tablet Take 800 mg by mouth every 8 (eight)  hours as needed. For muscle pain      . metoprolol (LOPRESSOR) 50 MG tablet Take 50 mg by mouth 2 (two) times daily.      . montelukast (SINGULAIR) 10 MG tablet Take 10 mg by mouth at bedtime.       . Multiple Vitamin (MULTIVITAMIN WITH MINERALS) TABS Take 1 tablet by mouth daily.      . nitroGLYCERIN (NITROSTAT) 0.4 MG SL tablet Place 0.4 mg under the tongue every 5 (five) minutes as needed. For chest pain      . triamcinolone (NASACORT) 55 MCG/ACT nasal inhaler Place 2 sprays into the nose 2 (two) times daily.       . beclomethasone (QVAR) 80 MCG/ACT inhaler Inhale 2 puffs into the lungs 3 (three) times daily.       No current facility-administered medications on file prior to visit.    Review of Systems:  As per HPI, otherwise negative.    Physical Examination: Filed Vitals:   01/15/13 1932  BP: 118/88  Pulse: 86  Temp: 98.5 F (36.9 C)  Resp: 16   Filed Vitals:   01/15/13 1932  Weight: 172 lb (78.019 kg)   Body mass index is 31.45 kg/(m^2). Ideal Body Weight:    GEN: WDWN, NAD, Non-toxic, A & O x 3 HEENT: Atraumatic, Normocephalic. Neck supple. No masses, No LAD. Ears and Nose: No external deformity. CV: RRR, No M/G/R. No JVD. No thrill. No extra heart sounds. PULM: CTA B, no wheezes, crackles, rhonchi. No retractions. No resp. distress. No accessory muscle use. ABD: S, NT, ND, +BS. No rebound. No HSM. EXTR: No c/c/e NEURO Normal gait.  PSYCH: Normally interactive. Conversant. Not depressed or anxious appearing.  Calm demeanor.    Assessment and Plan: Sinusitis augmentin ENT follow up  Signed,  Phillips Odor, MD

## 2013-01-26 ENCOUNTER — Encounter: Payer: Self-pay | Admitting: Cardiovascular Disease

## 2013-01-28 ENCOUNTER — Other Ambulatory Visit: Payer: Self-pay | Admitting: *Deleted

## 2013-01-28 DIAGNOSIS — M47817 Spondylosis without myelopathy or radiculopathy, lumbosacral region: Secondary | ICD-10-CM | POA: Diagnosis not present

## 2013-01-28 DIAGNOSIS — M961 Postlaminectomy syndrome, not elsewhere classified: Secondary | ICD-10-CM | POA: Diagnosis not present

## 2013-01-28 DIAGNOSIS — G894 Chronic pain syndrome: Secondary | ICD-10-CM | POA: Diagnosis not present

## 2013-01-28 MED ORDER — ATORVASTATIN CALCIUM 20 MG PO TABS: 20.0000 mg | ORAL_TABLET | Freq: Every day | ORAL | Status: DC

## 2013-01-29 ENCOUNTER — Ambulatory Visit
Admission: RE | Admit: 2013-01-29 | Discharge: 2013-01-29 | Disposition: A | Discharge status: Home / Self Care | Payer: Medicare Other | Source: Ambulatory Visit | Attending: Otolaryngology | Admitting: Otolaryngology

## 2013-01-29 ENCOUNTER — Other Ambulatory Visit: Payer: Self-pay | Admitting: Otolaryngology

## 2013-01-29 DIAGNOSIS — J3489 Other specified disorders of nose and nasal sinuses: Secondary | ICD-10-CM | POA: Diagnosis not present

## 2013-01-29 DIAGNOSIS — R0981 Nasal congestion: Secondary | ICD-10-CM

## 2013-01-29 DIAGNOSIS — J322 Chronic ethmoidal sinusitis: Secondary | ICD-10-CM | POA: Diagnosis not present

## 2013-02-10 DIAGNOSIS — J342 Deviated nasal septum: Secondary | ICD-10-CM | POA: Diagnosis not present

## 2013-02-12 ENCOUNTER — Ambulatory Visit (INDEPENDENT_AMBULATORY_CARE_PROVIDER_SITE_OTHER): Payer: Medicare Other | Admitting: Family Medicine

## 2013-02-12 ENCOUNTER — Emergency Department (HOSPITAL_COMMUNITY): Payer: Medicare Other

## 2013-02-12 ENCOUNTER — Ambulatory Visit: Payer: Medicare Other

## 2013-02-12 ENCOUNTER — Encounter (HOSPITAL_COMMUNITY): Payer: Self-pay | Admitting: *Deleted

## 2013-02-12 ENCOUNTER — Emergency Department (HOSPITAL_COMMUNITY)
Admission: EM | Admit: 2013-02-12 | Discharge: 2013-02-13 | Disposition: A | Discharge status: Home / Self Care | Payer: Medicare Other | Attending: Emergency Medicine | Admitting: Emergency Medicine

## 2013-02-12 ENCOUNTER — Other Ambulatory Visit: Payer: Self-pay

## 2013-02-12 VITALS — BP 158/90 | HR 91 | Temp 97.9°F | Resp 16 | Wt 170.6 lb

## 2013-02-12 DIAGNOSIS — Z86718 Personal history of other venous thrombosis and embolism: Secondary | ICD-10-CM | POA: Diagnosis not present

## 2013-02-12 DIAGNOSIS — Z9861 Coronary angioplasty status: Secondary | ICD-10-CM | POA: Insufficient documentation

## 2013-02-12 DIAGNOSIS — R42 Dizziness and giddiness: Secondary | ICD-10-CM | POA: Diagnosis not present

## 2013-02-12 DIAGNOSIS — R079 Chest pain, unspecified: Secondary | ICD-10-CM | POA: Diagnosis not present

## 2013-02-12 DIAGNOSIS — Z87448 Personal history of other diseases of urinary system: Secondary | ICD-10-CM | POA: Diagnosis not present

## 2013-02-12 DIAGNOSIS — Z79899 Other long term (current) drug therapy: Secondary | ICD-10-CM | POA: Diagnosis not present

## 2013-02-12 DIAGNOSIS — M549 Dorsalgia, unspecified: Secondary | ICD-10-CM | POA: Insufficient documentation

## 2013-02-12 DIAGNOSIS — Z85828 Personal history of other malignant neoplasm of skin: Secondary | ICD-10-CM | POA: Diagnosis not present

## 2013-02-12 DIAGNOSIS — Z86711 Personal history of pulmonary embolism: Secondary | ICD-10-CM | POA: Diagnosis not present

## 2013-02-12 DIAGNOSIS — H9202 Otalgia, left ear: Secondary | ICD-10-CM

## 2013-02-12 DIAGNOSIS — Z9889 Other specified postprocedural states: Secondary | ICD-10-CM | POA: Diagnosis not present

## 2013-02-12 DIAGNOSIS — M545 Low back pain: Secondary | ICD-10-CM

## 2013-02-12 DIAGNOSIS — E785 Hyperlipidemia, unspecified: Secondary | ICD-10-CM | POA: Insufficient documentation

## 2013-02-12 DIAGNOSIS — I10 Essential (primary) hypertension: Secondary | ICD-10-CM | POA: Insufficient documentation

## 2013-02-12 DIAGNOSIS — Z8719 Personal history of other diseases of the digestive system: Secondary | ICD-10-CM | POA: Diagnosis not present

## 2013-02-12 DIAGNOSIS — Z7982 Long term (current) use of aspirin: Secondary | ICD-10-CM | POA: Insufficient documentation

## 2013-02-12 DIAGNOSIS — R918 Other nonspecific abnormal finding of lung field: Secondary | ICD-10-CM | POA: Diagnosis not present

## 2013-02-12 DIAGNOSIS — D759 Disease of blood and blood-forming organs, unspecified: Secondary | ICD-10-CM | POA: Insufficient documentation

## 2013-02-12 DIAGNOSIS — IMO0002 Reserved for concepts with insufficient information to code with codable children: Secondary | ICD-10-CM | POA: Diagnosis not present

## 2013-02-12 DIAGNOSIS — K219 Gastro-esophageal reflux disease without esophagitis: Secondary | ICD-10-CM | POA: Diagnosis not present

## 2013-02-12 DIAGNOSIS — J189 Pneumonia, unspecified organism: Secondary | ICD-10-CM | POA: Diagnosis not present

## 2013-02-12 DIAGNOSIS — Z8739 Personal history of other diseases of the musculoskeletal system and connective tissue: Secondary | ICD-10-CM | POA: Diagnosis not present

## 2013-02-12 DIAGNOSIS — J45901 Unspecified asthma with (acute) exacerbation: Secondary | ICD-10-CM | POA: Diagnosis not present

## 2013-02-12 DIAGNOSIS — R0789 Other chest pain: Secondary | ICD-10-CM

## 2013-02-12 DIAGNOSIS — E039 Hypothyroidism, unspecified: Secondary | ICD-10-CM | POA: Diagnosis not present

## 2013-02-12 DIAGNOSIS — Z8679 Personal history of other diseases of the circulatory system: Secondary | ICD-10-CM | POA: Insufficient documentation

## 2013-02-12 DIAGNOSIS — I2510 Atherosclerotic heart disease of native coronary artery without angina pectoris: Secondary | ICD-10-CM | POA: Insufficient documentation

## 2013-02-12 DIAGNOSIS — R0602 Shortness of breath: Secondary | ICD-10-CM | POA: Diagnosis not present

## 2013-02-12 DIAGNOSIS — D509 Iron deficiency anemia, unspecified: Secondary | ICD-10-CM | POA: Diagnosis not present

## 2013-02-12 DIAGNOSIS — J309 Allergic rhinitis, unspecified: Secondary | ICD-10-CM

## 2013-02-12 DIAGNOSIS — R9389 Abnormal findings on diagnostic imaging of other specified body structures: Secondary | ICD-10-CM

## 2013-02-12 DIAGNOSIS — H9209 Otalgia, unspecified ear: Secondary | ICD-10-CM

## 2013-02-12 DIAGNOSIS — R071 Chest pain on breathing: Secondary | ICD-10-CM

## 2013-02-12 LAB — POCT CBC
Granulocyte percent: 53.1 % (ref 37–80)
HCT, POC: 37.1 % — AB (ref 37.7–47.9)
Hemoglobin: 11.7 g/dL — AB (ref 12.2–16.2)
Lymph, poc: 2.2 (ref 0.6–3.4)
MCH, POC: 27.6 pg (ref 27–31.2)
MCHC: 31.5 g/dL — AB (ref 31.8–35.4)
MCV: 87.5 fL (ref 80–97)
MID (cbc): 0.4 (ref 0–0.9)
MPV: 8.3 fL (ref 0–99.8)
POC Granulocyte: 2.9 (ref 2–6.9)
POC LYMPH PERCENT: 39.2 % (ref 10–50)
POC MID %: 7.7 % (ref 0–12)
Platelet Count, POC: 295 K/uL (ref 142–424)
RBC: 4.24 M/uL (ref 4.04–5.48)
RDW, POC: 14.2 %
WBC: 5.5 K/uL (ref 4.6–10.2)

## 2013-02-12 LAB — CBC
MCHC: 33.5 g/dL (ref 30.0–36.0)
Platelets: 306 10*3/uL (ref 150–400)
RDW: 13.7 % (ref 11.5–15.5)

## 2013-02-12 LAB — COMPREHENSIVE METABOLIC PANEL
ALT: 13 U/L (ref 0–35)
AST: 16 U/L (ref 0–37)
Albumin: 3.9 g/dL (ref 3.5–5.2)
Calcium: 10.2 mg/dL (ref 8.4–10.5)
GFR calc Af Amer: >90 mL/min (ref >90)
Sodium: 137 mEq/L (ref 135–145)
Total Protein: 7.8 g/dL (ref 6.0–8.3)

## 2013-02-12 LAB — GLUCOSE, POCT (MANUAL RESULT ENTRY): POC Glucose: 94 mg/dL (ref 70–99)

## 2013-02-12 LAB — TROPONIN I: Troponin I: <0.3 ng/mL (ref <0.30)

## 2013-02-12 MED ORDER — SODIUM CHLORIDE 0.9 % IV SOLN
INTRAVENOUS | Status: DC
  Administered 2013-02-12: via INTRAVENOUS

## 2013-02-12 MED ORDER — ONDANSETRON HCL 4 MG/2ML IJ SOLN
4.0000 mg | Freq: Once | INTRAMUSCULAR | Status: AC
  Administered 2013-02-12: 4 mg via INTRAVENOUS
  Filled 2013-02-12: qty 2

## 2013-02-12 MED ORDER — MORPHINE SULFATE 4 MG/ML IJ SOLN
4.0000 mg | Freq: Once | INTRAMUSCULAR | Status: AC
  Administered 2013-02-12: 4 mg via INTRAVENOUS
  Filled 2013-02-12: qty 1

## 2013-02-12 NOTE — Patient Instructions (Signed)
Go to [facility] ER immediately after leaving clinc fo revaluation of your abnormal chest xray. If any change in your symptoms pull oven and call 911.

## 2013-02-12 NOTE — ED Provider Notes (Signed)
History     CSN: [id number]  Arrival date & time 02/12/13  2138   First MD Initiated Contact with Patient 02/12/13 2310      Chief Complaint  Patient presents with  . Chest Pain  . Back Pain    (Consider location/radiation/quality/duration/timing/severity/associated sxs/prior treatment) HPI Hx per PT - R sided mid and lateral back pain with SOB, onset yesterday. Previous PEs not on coumadin - does take Arixtra. Has APL syndrome. Sharp pain worse with deep breathing. No trauma, no rash, no CP. Pain mod to severe, went to [facility] today had CXR and referred here for CT scan to evaluate for PE. No recent travel or surgery.  Past Medical History  Diagnosis Date  . Asthma     extrinsic; moderate, persistant, nml spirometry 2010, nml CXR 1/08  . Pulmonary embolism     related to back surgery with prior coumadin use, now off  . DVT (deep venous thrombosis)   . PAF (paroxysmal atrial fibrillation)     not on coumadin therapy  . HLD (hyperlipidemia)   . CAD (coronary artery disease)     50% mid LAD, 80% ostial D1 and moderate 80% mid circ by vath 2003  . Antiphospholipid syndrome     with hypercoagulable state  . Coronary heart disease   . Drug allergy     heparin/lovenox  . Erosive gastritis 1994  . Anemia, iron deficiency   . HTN (hypertension)     [name] at [facility], manages pt.   . Complication of anesthesia     cardiac arrest- in OR, at age 68 (5)y.o. during Scalenotomy   . Anxiety   . Hypothyroidism   . Bladder troubles     REPORTS INFECTIONS ON OCCASION DUE TO URETHRA MEATUS STRICTURE AT BIRTH   . H/O hiatal hernia   . Spondylosis, lumbosacral     ARTHRITIS- OA   . Cancer     basal cell removed fr. L arm   . Blood dyscrasia   . Liver spot     cyst- - no biopsy, but told that its benign   . GERD (gastroesophageal reflux disease)     Past Surgical History  Procedure Laterality Date  . Back surgery      x12 cervical and lumbar thoracic spine surgery  . Total  knee arthroplasty      left  . Cleft lip and palate repair      67 yo   . Hysterectomy    . Spina bifida repair      67 yo   . Cardiac catheterization      2005  . Joint replacement    . Tonsillectomy    . Breast surgery      all benign cysts x3   . Abdominal hysterectomy      partial abdominal -1998    Family History  Problem Relation Age of Onset  . Coronary artery disease Sister     MI     History  Substance Use Topics  . Smoking status: Former Smoker    Quit date: 10/22/1972  . Smokeless tobacco: Not on file     Comment: no smoking   . Alcohol Use: No    OB History   Grav Para Term Preterm Abortions TAB SAB Ect Mult Living                  Review of Systems  Constitutional: Negative for fever and chills.  HENT: Negative for sore throat, neck pain  and neck stiffness.   Eyes: Negative for pain.  Respiratory: Positive for shortness of breath.   Cardiovascular: Negative for palpitations and leg swelling.  Gastrointestinal: Negative for vomiting and abdominal pain.  Genitourinary: Negative for dysuria.  Musculoskeletal: Positive for back pain.  Skin: Negative for rash.  Neurological: Negative for headaches.  All other systems reviewed and are negative.    Allergies  Amoxicillin-pot clavulanate; Enoxaparin sodium; Heparin; Lactose intolerance (gi); Other; Pork-derived products; Povidone; and Indomethacin  Home Medications   Current Outpatient Rx  Name  Route  Sig  Dispense  Refill  . albuterol (PROVENTIL) (2.5 MG/3ML) 0.083% nebulizer solution   Nebulization   Take 2.5 mg by nebulization every 6 (six) hours as needed. For shortness of breath/wheezing         . aspirin 81 MG EC tablet   Oral   Take 81 mg by mouth daily.          Marland Kitchen atorvastatin (LIPITOR) 20 MG tablet   Oral   Take 1 tablet (20 mg total) by mouth daily.   30 tablet   5   . azelastine (ASTELIN) 137 MCG/SPRAY nasal spray   Nasal   Place 1 spray into the nose 2 (two) times daily  as needed. Use in each nostril as directed For allergies         . beclomethasone (QVAR) 80 MCG/ACT inhaler   Inhalation   Inhale 2 puffs into the lungs 3 (three) times daily.         . Calcium Carbonate-Vitamin D 600-400 MG-UNIT per tablet   Oral   Take 1 tablet by mouth 2 (two) times daily.           . cyclobenzaprine (FLEXERIL) 10 MG tablet   Oral   Take 10 mg by mouth 3 (three) times daily as needed. For severe muscle pain         . diazepam (VALIUM) 5 MG tablet   Oral   Take 5 mg by mouth at bedtime.          . diclofenac-misoprostol (ARTHROTEC 75) 75-0.2 MG per tablet   Oral   Take 1 tablet by mouth 2 (two) times daily. On hold for now due to low iron         . docusate sodium (COLACE) 100 MG capsule   Oral   Take 200 mg by mouth 2 (two) times daily.          Marland Kitchen esomeprazole (NEXIUM) 40 MG capsule   Oral   Take 40 mg by mouth 2 (two) times daily.          . fentaNYL (DURAGESIC - DOSED MCG/HR) 100 MCG/HR   Transdermal   Place 1 patch onto the skin every other day.          . ferrous sulfate (FEOSOL) 325 (65 FE) MG tablet   Oral   Take 325 mg by mouth daily with breakfast.         . gabapentin (NEURONTIN) 600 MG tablet   Oral   Take 900 mg by mouth at bedtime.          . Glucosamine-Chondroitin 750-600 MG TABS   Oral   Take 1 tablet by mouth 2 (two) times daily.           Marland Kitchen HYDROmorphone (DILAUDID) 4 MG tablet   Oral   Take 4-8 mg by mouth every 4 (four) hours as needed. For pain         .  levothyroxine (SYNTHROID, LEVOTHROID) 75 MCG tablet   Oral   Take 75 mcg by mouth daily before breakfast.          . Loratadine-Pseudoephedrine (CLARITIN-D 12 HOUR PO)   Oral   Take 1 tablet by mouth 2 (two) times daily.          . metaxalone (SKELAXIN) 800 MG tablet   Oral   Take 800 mg by mouth every 8 (eight) hours as needed. For muscle pain         . metoprolol (LOPRESSOR) 50 MG tablet   Oral   Take 50 mg by mouth 2 (two) times  daily.         . montelukast (SINGULAIR) 10 MG tablet   Oral   Take 10 mg by mouth at bedtime.          . Multiple Vitamin (MULTIVITAMIN WITH MINERALS) TABS   Oral   Take 1 tablet by mouth daily.         . pirbuterol (MAXAIR) 200 MCG/INH inhaler   Inhalation   Inhale 2 puffs into the lungs 4 (four) times daily.         Marland Kitchen triamcinolone (NASACORT) 55 MCG/ACT nasal inhaler   Nasal   Place 2 sprays into the nose 2 (two) times daily.          Marland Kitchen lidocaine (LIDODERM) 5 %   Transdermal   Place 1 patch onto the skin as needed. Remove & Discard patch within 12 hours or as directed by MD         . nitroGLYCERIN (NITROSTAT) 0.4 MG SL tablet   Sublingual   Place 0.4 mg under the tongue every 5 (five) minutes as needed. For chest pain           BP 160/80  Pulse 90  Temp(Src) 97.9 F (36.6 C) (Oral)  Resp 13  Ht 5\' 2"  (1.575 m)  Wt 170 lb (77.111 kg)  BMI 31.09 kg/m2  SpO2 100%  Physical Exam  Constitutional: She is oriented to person, place, and time. She appears well-developed and well-nourished.  HENT:  Head: Normocephalic and atraumatic.  Eyes: EOM are normal. Pupils are equal, round, and reactive to light.  Neck: Neck supple.  Cardiovascular: Normal rate, regular rhythm and intact distal pulses.   Pulmonary/Chest: Effort normal. No respiratory distress. She exhibits no tenderness.  Abdominal: Soft. Bowel sounds are normal. She exhibits no distension. There is no tenderness.  Musculoskeletal: Normal range of motion. She exhibits no edema.  Neurological: She is alert and oriented to person, place, and time.  Skin: Skin is warm and dry.    ED Course  Procedures (including critical care time)  Results for orders placed during the hospital encounter of 02/12/13  CBC      Result Value Range   WBC 6.7  4.0 - 10.5 K/uL   RBC 4.60  3.87 - 5.11 MIL/uL   Hemoglobin 13.0  12.0 - 15.0 g/dL   HCT 02.7  25.3 - 66.4 %   MCV 84.3  78.0 - 100.0 fL   MCH 28.3  26.0 -  34.0 pg   MCHC 33.5  30.0 - 36.0 g/dL   RDW 40.3  47.4 - 25.9 %   Platelets 306  150 - 400 K/uL  TROPONIN I      Result Value Range   Troponin I <0.30  <0.30 ng/mL  COMPREHENSIVE METABOLIC PANEL      Result Value Range   Sodium 137  135 -  145 mEq/L   Potassium 3.3 (*) 3.5 - 5.1 mEq/L   Chloride 95 (*) 96 - 112 mEq/L   CO2 30  19 - 32 mEq/L   Glucose, Bld 117 (*) 70 - 99 mg/dL   BUN 12  6 - 23 mg/dL   Creatinine, Ser 2.13 (*) 0.50 - 1.10 mg/dL   Calcium 08.6  8.4 - 57.8 mg/dL   Total Protein 7.8  6.0 - 8.3 g/dL   Albumin 3.9  3.5 - 5.2 g/dL   AST 16  0 - 37 U/L   ALT 13  0 - 35 U/L   Alkaline Phosphatase 93  39 - 117 U/L   Total Bilirubin 0.3  0.3 - 1.2 mg/dL   GFR calc non Af Amer >90  >90 mL/min   GFR calc Af Amer >90  >90 mL/min  PROTIME-INR      Result Value Range   Prothrombin Time 11.9  11.6 - 15.2 seconds   INR 0.88  0.00 - 1.49  POCT I-STAT, CHEM 8      Result Value Range   Sodium 139  135 - 145 mEq/L   Potassium 3.3 (*) 3.5 - 5.1 mEq/L   Chloride 100  96 - 112 mEq/L   BUN 12  6 - 23 mg/dL   Creatinine, Ser 4.69  0.50 - 1.10 mg/dL   Glucose, Bld 629 (*) 70 - 99 mg/dL   Calcium, Ion 5.28  4.13 - 1.30 mmol/L   TCO2 30  0 - 100 mmol/L   Hemoglobin 13.9  12.0 - 15.0 g/dL   HCT 24.4  01.0 - 27.2 %  POCT I-STAT TROPONIN I      Result Value Range   Troponin i, poc 0.00  0.00 - 0.08 ng/mL   Comment 3            Dg Ribs Unilateral W/chest Right  02/12/2013  *RADIOLOGY REPORT*  Clinical Data: Right-sided chest wall pain.  RIGHT RIBS AND CHEST - 3+ VIEW  Comparison: 09/26/2012  Findings: Frontal view of the chest and 2 views of right-sided ribs.  Frontal view chest demonstrates thoracolumbar spine fixation.  Lower cervical spine fixation.  Lung apices partially excluded.  Normal heart size.  No pleural fluid.  No definite pneumothorax. Development of patchy right lower lobe airspace disease, including laterally.  Clear left lung.  2 views of right-sided ribs.  Suboptimal  patient positioning on both views.  No displaced fracture identified.  IMPRESSION: No displaced rib fracture or pneumothorax.  Suboptimal patient positioning on rib films.  Lateral right-sided airspace disease, suspicious for pneumonia. Given the clinical history of chest wall pain, atelectasis or infarcts secondary to pulmonary embolism would be an alternate explanation.   Original Report Authenticated By: Jeronimo Greaves, M.D.    Ct Angio Chest Pe W/cm &/or Wo Cm  02/13/2013  *RADIOLOGY REPORT*  Clinical Data: Shortness of breath  CT ANGIOGRAPHY CHEST  Technique:  Multidetector CT imaging of the chest using the standard protocol during bolus administration of intravenous contrast. Multiplanar reconstructed images including MIPs were obtained and reviewed to evaluate the vascular anatomy.  Contrast: OMNIPAQUE IOHEXOL 350 MG/ML SOLN  Comparison: 02/12/2013 radiograph  Findings: Pulmonary arterial branches are patent.  Normal caliber aorta.  Heart size enlarged.  Coronary artery calcification.  No pleural or pericardial effusion.  Prominent mediastinal and hilar lymph nodes, with soft tissue continuing along the bronchovascular bundles.  As index, 1.1 cm lymph node anterior to the left main bronchus and subcarinal  node measuring 1 cm short axis.  Right hilar lymph node measuring 8 mm.  Central airways are patent.  Lung evaluation degraded by motion. Patchy airspace opacity within the right upper, middle, and lower lobe.  Areas of ground-glass opacity and mosaic attenuation throughout both lungs.  Limited images through the upper abdomen show no acute finding.  Osteopenia and multilevel degenerative changes.  Partially imaged cervical fusion hardware and  thoracolumbar hardware.  IMPRESSION: No pulmonary embolism identified.  Patchy right lung airspace opacities may reflect pneumonia.  Mediastinal / right hilar adenopathy may be reactive.  Recommend attention to the above findings at short-term (6-8 week)  follow-up.  Cardiomegaly with coronary artery calcification.  Bilateral areas of ground-glass opacity may reflect atelectasis or edema.  Bilateral areas of mosaic attenuation may reflect air trapping.   Original Report Authenticated By: Jearld Lesch, M.D.    Date: 02/12/2013  Rate: 88  Rhythm: normal sinus rhythm  QRS Axis: normal  Intervals: normal  ST/T Wave abnormalities: nonspecific ST changes  Conduction Disutrbances:none  Narrative Interpretation:   Old EKG Reviewed: none available  11:42 PM RA pulse drops to 80s, placed on oxygen. Ct Angio ordered.potassium for mild hypokalemia  IV ABx, IV morphine. Plan close PCP f/u R sided PNA  MDM  R lateral back pain SOB h/o PE  CT Angio, labs, ECG, ABx  VS and nursing notes reviewed       Sunnie Nielsen, MD 02/13/13 [phone]

## 2013-02-12 NOTE — ED Notes (Signed)
Pt went to urgent care today d/t dizziness chest pain and back pain. Pt states d/t the xray that was obtained at urgent care they told her to come to the ED. Pt states that the xray per urgent care stated she could possibly have a PE. Pt is not on blood thinners.

## 2013-02-12 NOTE — Progress Notes (Signed)
Subjective:    Patient ID: Latoya [name], female    DOB: 06/03/1946, 67 y.o.   MRN: [id number]  HPI Latoya [name] is a 67 y.o. female "Latoya [name]" has a complicated past medical history, but presents with a few concerns today.  PCP: Shary Decamp. Allergist - Dr. [place] Callas, ENT: Dr. Ezzard Standing. Cards: [place].   Recurrent sore throat, both sided earache, left worse than right side, popping sensation, but no trouble hearing, minimal cough with occasional PND. Sx's since last ov.  Seen here 01/15/13 - diagnosed with sinusitis and rx of Augmentin with follow up with ENT.  Was able to take Augmentin with eating yogurt/.   Felt like sx's were better with antibiotic, then back afterwards. Has been taking Astelin, Claritin, singulair, and nasaocort each day.  restarted Qvar in past month for asthma (off after surgery), maxair if needed - did take maxair few times past few days - for possible wheeze when lying down.  No chest pain. Denies dyspnea unless around pollen.  When seen by Dr. Ezzard Standing - no new meds - just needs to be seen by allergist.  Has had to take a shot of cortisone/steroid to calm down allergies - last injection last year.  No hx of glaucoma.  Next appt.  with Dr.Sharma in June.  Last seen last year. Tried to call Dr. [place] Callas this week,unabel to be seen recently. Sinuses are better, just ear pain/popping.   Per chart - maxillofacial CT ordered by Dr. Ezzard Standing 01/29/13:  Findings: The patient was unable to tolerate prone or supine pain  head positioning. Axial images only were acquired.  The maxillary sinuses are clear. The frontal sinuses are  hypoplastic but clear. The ethmoid sinuses are clear. The  sphenoid sinus is clear. There appears to be nasal septal  deviation of 4 mm left to right. The nasal cavity on the left  appears somewhat patulous, and there may have been resection or  trimming of the turbinates on that side.  No orbital abnormality. Intracranial compartment grossly negative.    IMPRESSION:  No evidence for acute or chronic paranasal sinus fluid  accumulation. See discussion above.   Back pain - past few days - R low back "with allergies" - grabbing sensation, notes with lying down.  Hx of multiple back surgeries - different area of pain -"feels like lung?"  No dysuria, no blood in urine, no new incontinence with these symptoms.  Has coughed past few nights - sore in R side with cough.  No known hx of rib fracture or osteoporosis. Hx of pulmonary embolus years ago. Does admit to pain with breathing in. Hx of antiphospholipid antibody syndrome - hematologist at [facility].   Dizziness - past month.  Has not had this evaluated prior visit. has not discussed with PCP.   Feels similar to dizziness in past with allergies. No new muscular weakness. Notes more when first waking up and lying on L side. No slurred speech, no new headache. Uses rolling walker usually. Hx of vertigo with allergies and current sx's same as in past. Taking meclizine - 2 at night, then during the day as needed - up to 2-3 times.     Past Medical History  Diagnosis Date  . Asthma     extrinsic; moderate, persistant, nml spirometry 2010, nml CXR 1/08  . Pulmonary embolism     related to back surgery with prior coumadin use, now off  . DVT (deep venous thrombosis)   . PAF (paroxysmal atrial fibrillation)  not on coumadin therapy  . HLD (hyperlipidemia)   . CAD (coronary artery disease)     50% mid LAD, 80% ostial D1 and moderate 80% mid circ by vath 2003  . Antiphospholipid syndrome     with hypercoagulable state  . Coronary heart disease   . Drug allergy     heparin/lovenox  . Erosive gastritis 1994  . Anemia, iron deficiency   . HTN (hypertension)     [name] at [facility], manages pt.   . Complication of anesthesia     cardiac arrest- in OR, at age 50 (68)y.o. during Scalenotomy   . Anxiety   . Hypothyroidism   . Bladder troubles     REPORTS INFECTIONS ON OCCASION DUE TO URETHRA MEATUS  STRICTURE AT BIRTH   . H/O hiatal hernia   . Spondylosis, lumbosacral     ARTHRITIS- OA   . Cancer     basal cell removed fr. L arm   . Blood dyscrasia   . Liver spot     cyst- - no biopsy, but told that its benign   . GERD (gastroesophageal reflux disease)    Past Surgical History  Procedure Laterality Date  . Back surgery      x12 cervical and lumbar thoracic spine surgery  . Total knee arthroplasty      left  . Cleft lip and palate repair      67 yo   . Hysterectomy    . Spina bifida repair      67 yo   . Cardiac catheterization      2005  . Joint replacement    . Tonsillectomy    . Breast surgery      all benign cysts x3   . Abdominal hysterectomy      partial abdominal -1998   Allergies  Allergen Reactions  . Amoxicillin-Pot Clavulanate Diarrhea  . Enoxaparin Sodium     REACTION: thrombocytopenia  . Heparin     REACTION: thrombocytopenia  . Lactose Intolerance (Gi)   . Other     Sensitive to dye in Betadine & Chlorohexadine   . Pork-Derived Products   . Povidone     Sensitivity- but if its wiped off she is able to tolerate betadine   . Indomethacin Rash   Current outpatient prescriptions:aspirin 81 MG EC tablet, Take 81 mg by mouth daily. , Disp: , Rfl: ;  atorvastatin (LIPITOR) 20 MG tablet, Take 1 tablet (20 mg total) by mouth daily., Disp: 30 tablet, Rfl: 5;  azelastine (ASTELIN) 137 MCG/SPRAY nasal spray, Place 1 spray into the nose 2 (two) times daily as needed. Use in each nostril as directed For allergies, Disp: , Rfl:  Calcium Carbonate-Vitamin D 600-400 MG-UNIT per tablet, Take 1 tablet by mouth 2 (two) times daily.  , Disp: , Rfl: ;  cyclobenzaprine (FLEXERIL) 10 MG tablet, Take 10 mg by mouth 3 (three) times daily as needed. For severe muscle pain, Disp: , Rfl: ;  diazepam (VALIUM) 5 MG tablet, Take 5 mg by mouth at bedtime. , Disp: , Rfl:  diclofenac-misoprostol (ARTHROTEC 75) 75-0.2 MG per tablet, Take 1 tablet by mouth 2 (two) times daily. On hold for  now due to low iron, Disp: , Rfl: ;  docusate sodium (COLACE) 100 MG capsule, Take 200 mg by mouth 2 (two) times daily. , Disp: , Rfl: ;  esomeprazole (NEXIUM) 40 MG capsule, Take 40 mg by mouth 2 (two) times daily. , Disp: , Rfl:  ferrous sulfate (FEOSOL) 325 (  65 FE) MG tablet, Take 325 mg by mouth daily with breakfast., Disp: , Rfl: ;  gabapentin (NEURONTIN) 600 MG tablet, Take 900 mg by mouth at bedtime. , Disp: , Rfl: ;  Glucosamine-Chondroitin 750-600 MG TABS, Take 1 tablet by mouth 2 (two) times daily.  , Disp: , Rfl: ;  HYDROmorphone (DILAUDID) 4 MG tablet, Take 4-8 mg by mouth every 4 (four) hours as needed. For pain, Disp: , Rfl:  levothyroxine (SYNTHROID, LEVOTHROID) 75 MCG tablet, Take 75 mcg by mouth daily before breakfast. , Disp: , Rfl: ;  lidocaine (LIDODERM) 5 %, Place 1 patch onto the skin as needed. Remove & Discard patch within 12 hours or as directed by MD, Disp: , Rfl: ;  Loratadine-Pseudoephedrine (CLARITIN-D 12 HOUR PO), Take 1 tablet by mouth 2 (two) times daily. , Disp: , Rfl:  metaxalone (SKELAXIN) 800 MG tablet, Take 800 mg by mouth every 8 (eight) hours as needed. For muscle pain, Disp: , Rfl: ;  metoprolol (LOPRESSOR) 50 MG tablet, Take 50 mg by mouth 2 (two) times daily., Disp: , Rfl: ;  montelukast (SINGULAIR) 10 MG tablet, Take 10 mg by mouth at bedtime. , Disp: , Rfl: ;  Multiple Vitamin (MULTIVITAMIN WITH MINERALS) TABS, Take 1 tablet by mouth daily., Disp: , Rfl:  nitroGLYCERIN (NITROSTAT) 0.4 MG SL tablet, Place 0.4 mg under the tongue every 5 (five) minutes as needed. For chest pain, Disp: , Rfl: ;  pirbuterol (MAXAIR) 200 MCG/INH inhaler, Inhale 2 puffs into the lungs 4 (four) times daily., Disp: , Rfl: ;  triamcinolone (NASACORT) 55 MCG/ACT nasal inhaler, Place 2 sprays into the nose 2 (two) times daily. , Disp: , Rfl:  albuterol (PROVENTIL) (2.5 MG/3ML) 0.083% nebulizer solution, Take 2.5 mg by nebulization every 6 (six) hours as needed. For shortness of breath/wheezing,  Disp: , Rfl: ;  amoxicillin-clavulanate (AUGMENTIN) 875-125 MG per tablet, Take 1 tablet by mouth 2 (two) times daily., Disp: 20 tablet, Rfl: 0;  beclomethasone (QVAR) 80 MCG/ACT inhaler, Inhale 2 puffs into the lungs 3 (three) times daily., Disp: , Rfl:  fentaNYL (DURAGESIC - DOSED MCG/HR) 100 MCG/HR, Place 1 patch onto the skin every other day. , Disp: , Rfl:   History   Social History  . Marital Status: Married    Spouse Name: N/A    Number of Children: N/A  . Years of Education: N/A   Occupational History  . Not on file.   Social History Main Topics  . Smoking status: Former Smoker    Quit date: 10/22/1972  . Smokeless tobacco: Not on file     Comment: no smoking   . Alcohol Use: No  . Drug Use: No  . Sexually Active: Not on file   Other Topics Concern  . Not on file   Social History Narrative   Lives in [place] with husband; has had miscarriages but no children.    Disabled but exercises nearly every day (can only exericse in water - aerobics)   Takes opioids for pain relief; takes no herbal medications; has a heart healthy diet.      Review of Systems  Respiratory: Positive for cough. Negative for chest tightness and shortness of breath.   Cardiovascular: Positive for chest pain.  Genitourinary: Negative for dysuria, urgency, frequency, hematuria and difficulty urinating.  Neurological: Positive for dizziness. Negative for syncope, facial asymmetry, speech difficulty and weakness.   As above.  Additionally not dyspneic, no chest pain, no fever.     Objective:  Physical Exam  Vitals reviewed. Constitutional: She is oriented to person, place, and time. She appears well-developed and well-nourished. No distress.  HENT:  Head: Normocephalic and atraumatic.  Right Ear: Hearing, tympanic membrane, external ear and ear canal normal. No foreign bodies. Tympanic membrane is not injected, not erythematous and not bulging. No middle ear effusion.  Left Ear: Hearing, tympanic  membrane, external ear and ear canal normal. No foreign bodies. Tympanic membrane is not injected, not erythematous and not bulging.  No middle ear effusion.  Nose: Nose normal.  Mouth/Throat: Oropharynx is clear and moist. No oropharyngeal exudate.  Eyes: Conjunctivae and EOM are normal. Pupils are equal, round, and reactive to light. Right eye exhibits no nystagmus. Left eye exhibits no nystagmus.  Neck: Carotid bruit is not present.  Cardiovascular: Normal rate, regular rhythm, normal heart sounds and intact distal pulses.   No murmur heard. Pulmonary/Chest: Effort normal and breath sounds normal. No accessory muscle usage. Not tachypneic. No respiratory distress. She has no decreased breath sounds. She has no wheezes. She has no rhonchi. She has no rales.   She exhibits tenderness.  Neurological: She is alert and oriented to person, place, and time. She has normal strength. No sensory deficit.  Equal ue/le strength, no focal deficit appreciated.  Did not test rhomberg or pronator drift as notes dizziness with standing - persistent past month.   Skin: Skin is warm and dry. No rash noted.  Psychiatric: She has a normal mood and affect. Her behavior is normal.   Results for orders placed in visit on 02/12/13  POCT CBC      Result Value Range   WBC 5.5  4.6 - 10.2 K/uL   Lymph, poc 2.2  0.6 - 3.4   POC LYMPH PERCENT 39.2  10 - 50 %L   MID (cbc) 0.4  0 - 0.9   POC MID % 7.7  0 - 12 %M   POC Granulocyte 2.9  2 - 6.9   Granulocyte percent 53.1  37 - 80 %G   RBC 4.24  4.04 - 5.48 M/uL   Hemoglobin 11.7 (*) 12.2 - 16.2 g/dL   HCT, POC 16.1 (*) 09.6 - 47.9 %   MCV 87.5  80 - 97 fL   MCH, POC 27.6  27 - 31.2 pg   MCHC 31.5 (*) 31.8 - 35.4 g/dL   RDW, POC 04.5     Platelet Count, POC 295  142 - 424 K/uL   MPV 8.3  0 - 99.8 fL  GLUCOSE, POCT (MANUAL RESULT ENTRY)      Result Value Range   POC Glucose 94  70 - 99 mg/dl    [facility] reading (PRIMARY) by  Dr. Neva Seat: R rib series - increased  markings in RLL noted compared to 09/2012, with possible Decreased R lung volume.   Radiology reading - *RADIOLOGY REPORT*  Clinical Data: Right-sided chest wall pain.  RIGHT RIBS AND CHEST - 3+ VIEW  Comparison: 09/26/2012  Findings: Frontal view of the chest and 2 views of right-sided  ribs. Frontal view chest demonstrates thoracolumbar spine  fixation. Lower cervical spine fixation. Lung apices partially  excluded.  Normal heart size. No pleural fluid. No definite pneumothorax.  Development of patchy right lower lobe airspace disease, including  laterally. Clear left lung.  2 views of right-sided ribs. Suboptimal patient positioning on  both views. No displaced fracture identified.  IMPRESSION:  No displaced rib fracture or pneumothorax. Suboptimal patient  positioning on rib films.  Lateral  right-sided airspace disease, suspicious for pneumonia.  Given the clinical history of chest wall pain, atelectasis or  infarcts secondary to pulmonary embolism would be an alternate  explanation.  Original Report Authenticated By: Jeronimo Greaves, M.D.       Assessment & Plan:  Latoya [name] is a 67 y.o. female Chest wall pain - Plan: DG Ribs Unilateral W/Chest Right  Low back pain - Plan: DG Ribs Unilateral W/Chest Right  Allergic rhinitis  Dizziness - Plan: POCT CBC, POCT glucose (manual entry), Basic metabolic panel  Otalgia, left  Hypothyroid - Plan: TSH  Abnormal CXR  Allergic rhinitis - likely allergies as primary cause of many of her symptoms, including ear pain/popping/ETD. Recent ENT eval and CT maxillofacial reassuring. Has been compliant with usual meds, and required steroid injection in past for allergies, but has not been able to follow up with allergist recently. May need short course of steroids/injection but will defer this given workup below at present for PE vs pneumonia.   Dizziness - 1 month hx - with vertigo sx's typical of prior hx of allergies.  Denies dyspnea,  Nonfocal exam, and discussed other possible causes of dizziness.  Will check BMP, TSH.  Borderline HGB - unlikey cause of sx's, hx of anemia - plans to restart iron supplementation.  Can continue meclizine, work on getting into allergist, eval lung finding below. ER precautions discussed, and if not improving - recheck with PCP, or ER/here sooner if worse.   R sided mid back/chest wall pain - hx of DVT and pulmonary embolus in past, and hx of hypercoagulable state with antiphospholipid syndrome. Possible RLL pneumonia, but normal WBC. Surgery few months ago  Denies any recent calf pain or swelling. O2 sat wnl (verified by recheck). Sent to [facility] for eval with CT to determine if PE vs pneumonia.  Discussed transport - chose private vehicle and risks discussed. Quincy Carnes ER triage nurse.     Patient Instructions  Go to [facility] ER immediately after leaving clinc fo revaluation of your abnormal chest xray. If any change in your symptoms pull oven and call 911.

## 2013-02-13 ENCOUNTER — Encounter (HOSPITAL_COMMUNITY): Payer: Self-pay

## 2013-02-13 DIAGNOSIS — R0602 Shortness of breath: Secondary | ICD-10-CM | POA: Diagnosis not present

## 2013-02-13 LAB — POCT I-STAT, CHEM 8
BUN: 12 mg/dL (ref 6–23)
Creatinine, Ser: 0.6 mg/dL (ref 0.50–1.10)
Hemoglobin: 13.9 g/dL (ref 12.0–15.0)
Potassium: 3.3 mEq/L — ABNORMAL LOW (ref 3.5–5.1)
Sodium: 139 mEq/L (ref 135–145)

## 2013-02-13 LAB — POCT I-STAT TROPONIN I

## 2013-02-13 LAB — TSH: TSH: 1.959 u[IU]/mL (ref 0.350–4.500)

## 2013-02-13 LAB — BASIC METABOLIC PANEL
BUN: 14 mg/dL (ref 6–23)
Calcium: 10.1 mg/dL (ref 8.4–10.5)
Glucose, Bld: 101 mg/dL — ABNORMAL HIGH (ref 70–99)

## 2013-02-13 MED ORDER — IOHEXOL 350 MG/ML SOLN
100.0000 mL | Freq: Once | INTRAVENOUS | Status: AC | PRN
  Administered 2013-02-13: 100 mL via INTRAVENOUS

## 2013-02-13 MED ORDER — POTASSIUM CHLORIDE CRYS ER 20 MEQ PO TBCR
20.0000 meq | EXTENDED_RELEASE_TABLET | Freq: Once | ORAL | Status: AC
  Administered 2013-02-13: 20 meq via ORAL
  Filled 2013-02-13: qty 1

## 2013-02-13 MED ORDER — LEVOFLOXACIN 500 MG PO TABS
750.0000 mg | ORAL_TABLET | Freq: Once | ORAL | Status: AC
  Administered 2013-02-13: 750 mg via ORAL
  Filled 2013-02-13 (×2): qty 1

## 2013-02-13 MED ORDER — LEVOFLOXACIN 500 MG PO TABS: 500.0000 mg | ORAL_TABLET | Freq: Every day | ORAL | Status: DC

## 2013-02-17 DIAGNOSIS — J159 Unspecified bacterial pneumonia: Secondary | ICD-10-CM | POA: Diagnosis not present

## 2013-02-26 DIAGNOSIS — M5412 Radiculopathy, cervical region: Secondary | ICD-10-CM | POA: Diagnosis not present

## 2013-02-26 DIAGNOSIS — M47817 Spondylosis without myelopathy or radiculopathy, lumbosacral region: Secondary | ICD-10-CM | POA: Diagnosis not present

## 2013-02-26 DIAGNOSIS — G894 Chronic pain syndrome: Secondary | ICD-10-CM | POA: Diagnosis not present

## 2013-02-26 DIAGNOSIS — M961 Postlaminectomy syndrome, not elsewhere classified: Secondary | ICD-10-CM | POA: Diagnosis not present

## 2013-03-02 ENCOUNTER — Ambulatory Visit: Payer: Medicare Other | Admitting: Cardiovascular Disease

## 2013-03-02 DIAGNOSIS — M4310 Spondylolisthesis, site unspecified: Secondary | ICD-10-CM | POA: Diagnosis not present

## 2013-03-02 DIAGNOSIS — D6859 Other primary thrombophilia: Secondary | ICD-10-CM | POA: Diagnosis not present

## 2013-03-02 DIAGNOSIS — M4120 Other idiopathic scoliosis, site unspecified: Secondary | ICD-10-CM | POA: Diagnosis not present

## 2013-03-02 DIAGNOSIS — Z981 Arthrodesis status: Secondary | ICD-10-CM | POA: Diagnosis not present

## 2013-03-20 ENCOUNTER — Ambulatory Visit (INDEPENDENT_AMBULATORY_CARE_PROVIDER_SITE_OTHER): Payer: Medicare Other | Admitting: Cardiovascular Disease

## 2013-03-20 ENCOUNTER — Encounter: Payer: Self-pay | Admitting: Cardiovascular Disease

## 2013-03-20 DIAGNOSIS — I2510 Atherosclerotic heart disease of native coronary artery without angina pectoris: Secondary | ICD-10-CM | POA: Diagnosis not present

## 2013-03-20 DIAGNOSIS — M79609 Pain in unspecified limb: Secondary | ICD-10-CM | POA: Diagnosis not present

## 2013-03-20 DIAGNOSIS — I4891 Unspecified atrial fibrillation: Secondary | ICD-10-CM | POA: Diagnosis not present

## 2013-03-20 DIAGNOSIS — I10 Essential (primary) hypertension: Secondary | ICD-10-CM

## 2013-03-20 DIAGNOSIS — I480 Paroxysmal atrial fibrillation: Secondary | ICD-10-CM

## 2013-03-20 NOTE — Patient Instructions (Addendum)
Your physician wants you to follow-up in: 6 months. You will receive a reminder letter in the mail two months in advance. If you don't receive a letter, please call our office to schedule the follow-up appointment.  Your physician has requested that you have a lower or upper extremity arterial duplex. This test is an ultrasound of the arteries in the legs or arms. It looks at arterial blood flow in the legs and arms. Allow one hour for Lower and Upper Arterial scans. There are no restrictions or special instructions   

## 2013-03-20 NOTE — Progress Notes (Signed)
History of Present Illness: 67 yo female with HTN, hyperlipidemia, anti-phospholipid antibody syndrome, GERD, asthma, CAD s/p cath 2003 with 30% ostial LM stenosis, 50% mid LAD, 80% moderate sized Diagonal, 80% Circumflex. There was discussion about bypass but ultimately medical management was pursued. She also has chronic back pain and paroxysmal atrial fibrillation. She has been previously followed by Dr. [name] in our office. She has a hypercoagulable state but has not been managed on coumadin recently. She has allergies to heparin (anaphylaxis) and Lovenox. Her coumadin had been stopped in the past because of trouble regulating her dose. She is seen by a hematologist at [facility] (Dr. Graylon Gunning) and he has told her that she does not need coumadin. She had been on amiodarone in the past but this was stopped in August 2010 by Dr. [name]. She has been diagnosed with hypothyroidism and has been on Synthroid. She has had no lower extremity edema but she does have chronic problems with pain in her feet from Raynaud's phenomenon and ongoing sciatica. Most recent stress test October 2013 with no ischemia, normal LV function.  Last echo in February 2011 with normal LV size, mild LVH, normal LV systolic function with EF of 55%, mild MR with mildly thickened mitral valve leaflets. Carotid artery dopplers 11/2012 with stable 40-59% bilateral stenosis.   She tells me today that she has been feeling ok. She has recovered from her back surgery.  She has had no chest pain. Her breathing has been ok. BP has been well controlled at home. She does describe bilateral foot pain and describes purplish coloring of toes.   Primary Care Physician: Shary Decamp   Last Lipid Profile: Followed in primary care.   Past Medical History  Diagnosis Date  . Asthma     extrinsic; moderate, persistant, nml spirometry 2010, nml CXR 1/08  . Pulmonary embolism     related to back surgery with prior coumadin use, now off  . DVT (deep  venous thrombosis)   . PAF (paroxysmal atrial fibrillation)     not on coumadin therapy  . HLD (hyperlipidemia)   . CAD (coronary artery disease)     50% mid LAD, 80% ostial D1 and moderate 80% mid circ by vath 2003  . Antiphospholipid syndrome     with hypercoagulable state  . Coronary heart disease   . Drug allergy     heparin/lovenox  . Erosive gastritis 1994  . Anemia, iron deficiency   . HTN (hypertension)     [name] at [facility], manages pt.   . Complication of anesthesia     cardiac arrest- in OR, at age 73 (68)y.o. during Scalenotomy   . Anxiety   . Hypothyroidism   . Bladder troubles     REPORTS INFECTIONS ON OCCASION DUE TO URETHRA MEATUS STRICTURE AT BIRTH   . H/O hiatal hernia   . Spondylosis, lumbosacral     ARTHRITIS- OA   . Cancer     basal cell removed fr. L arm   . Blood dyscrasia   . Liver spot     cyst- - no biopsy, but told that its benign   . GERD (gastroesophageal reflux disease)     Past Surgical History  Procedure Laterality Date  . Back surgery      x12 cervical and lumbar thoracic spine surgery  . Total knee arthroplasty      left  . Cleft lip and palate repair      67 yo   . Hysterectomy    .  Spina bifida repair      67 yo   . Cardiac catheterization      2005  . Joint replacement    . Tonsillectomy    . Breast surgery      all benign cysts x3   . Abdominal hysterectomy      partial abdominal -1998    Current Outpatient Prescriptions  Medication Sig Dispense Refill  . albuterol (PROVENTIL) (2.5 MG/3ML) 0.083% nebulizer solution Take 2.5 mg by nebulization every 6 (six) hours as needed. For shortness of breath/wheezing      . aspirin 81 MG EC tablet Take 81 mg by mouth daily.       Marland Kitchen atorvastatin (LIPITOR) 20 MG tablet Take 1 tablet (20 mg total) by mouth daily.  30 tablet  5  . azelastine (ASTELIN) 137 MCG/SPRAY nasal spray Place 1 spray into the nose 2 (two) times daily as needed. Use in each nostril as directed For allergies       . beclomethasone (QVAR) 80 MCG/ACT inhaler Inhale 2 puffs into the lungs 3 (three) times daily.      . Calcium Carbonate-Vitamin D 600-400 MG-UNIT per tablet Take 1 tablet by mouth 2 (two) times daily.        . cyclobenzaprine (FLEXERIL) 10 MG tablet Take 10 mg by mouth 3 (three) times daily as needed. For severe muscle pain      . diazepam (VALIUM) 5 MG tablet Take 5 mg by mouth as needed.       . docusate sodium (COLACE) 100 MG capsule Take 200 mg by mouth 2 (two) times daily.       Marland Kitchen esomeprazole (NEXIUM) 40 MG capsule Take 40 mg by mouth 2 (two) times daily.       . fentaNYL (DURAGESIC - DOSED MCG/HR) 100 MCG/HR Place 1 patch onto the skin every other day.       . ferrous sulfate (FEOSOL) 325 (65 FE) MG tablet Take 325 mg by mouth daily with breakfast.      . gabapentin (NEURONTIN) 600 MG tablet Take 900 mg by mouth at bedtime.       . Glucosamine-Chondroitin 750-600 MG TABS Take 1 tablet by mouth 2 (two) times daily.        Marland Kitchen HYDROmorphone (DILAUDID) 4 MG tablet Take 4-8 mg by mouth every 4 (four) hours as needed. For pain      . levothyroxine (SYNTHROID, LEVOTHROID) 75 MCG tablet Take 75 mcg by mouth daily before breakfast.       . lidocaine (LIDODERM) 5 % Place 1 patch onto the skin as needed. Remove & Discard patch within 12 hours or as directed by MD      . Loratadine-Pseudoephedrine (CLARITIN-D 12 HOUR PO) Take 1 tablet by mouth 2 (two) times daily.       . metaxalone (SKELAXIN) 800 MG tablet Take 800 mg by mouth every 8 (eight) hours as needed. For muscle pain      . metoprolol (LOPRESSOR) 50 MG tablet Take 50 mg by mouth 2 (two) times daily.      . montelukast (SINGULAIR) 10 MG tablet Take 10 mg by mouth at bedtime.       . Multiple Vitamin (MULTIVITAMIN WITH MINERALS) TABS Take 1 tablet by mouth daily.      . nitroGLYCERIN (NITROSTAT) 0.4 MG SL tablet Place 0.4 mg under the tongue every 5 (five) minutes as needed. For chest pain      . pirbuterol (MAXAIR) 200 MCG/INH inhaler Inhale  2 puffs into the lungs 4 (four) times daily.      Marland Kitchen triamcinolone (NASACORT) 55 MCG/ACT nasal inhaler Place 2 sprays into the nose 2 (two) times daily.       . diclofenac-misoprostol (ARTHROTEC 75) 75-0.2 MG per tablet Take 1 tablet by mouth 2 (two) times daily. On hold for now due to low iron       No current facility-administered medications for this visit.    Allergies  Allergen Reactions  . Amoxicillin-Pot Clavulanate Diarrhea    "patient stated that she can take this medication"  . Enoxaparin Sodium     REACTION: thrombocytopenia  . Heparin     REACTION: thrombocytopenia  . Lactose Intolerance (Gi)   . Other     Sensitive to dye in Betadine & Chlorohexadine   . Pork-Derived Products   . Povidone     Sensitivity- but if its wiped off she is able to tolerate betadine   . Indomethacin Rash    History   Social History  . Marital Status: Married    Spouse Name: N/A    Number of Children: N/A  . Years of Education: N/A   Occupational History  . Not on file.   Social History Main Topics  . Smoking status: Former Smoker    Quit date: 10/22/1972  . Smokeless tobacco: Not on file     Comment: no smoking   . Alcohol Use: No  . Drug Use: No  . Sexually Active: Not on file   Other Topics Concern  . Not on file   Social History Narrative   Lives in [place] with husband; has had miscarriages but no children.    Disabled but exercises nearly every day (can only exericse in water - aerobics)   Takes opioids for pain relief; takes no herbal medications; has a heart healthy diet.     Family History  Problem Relation Age of Onset  . Coronary artery disease Sister     MI     Review of Systems:  As stated in the HPI and otherwise negative.   BP 138/84  Pulse 106  Ht 5\' 2"  (1.575 m)  Wt 173 lb (78.472 kg)  BMI 31.63 kg/m2  SpO2 96%  Physical Examination: General: Well developed, well nourished, NAD HEENT: OP clear, mucus membranes moist SKIN: warm, dry. No  rashes. Neuro: No focal deficits Musculoskeletal: Muscle strength 5/5 all ext Psychiatric: Mood and affect normal Neck: No JVD, no carotid bruits, no thyromegaly, no lymphadenopathy. Lungs:Clear bilaterally, no wheezes, rhonci, crackles Cardiovascular: Regular rate and rhythm. No murmurs, gallops or rubs. Abdomen:Soft. Bowel sounds present. Non-tender.  Extremities: No lower extremity edema. Pulses are trace in the bilateral DP/PT.   EKG: NSR, rate 83 bpm.   Assessment and Plan:   1. CAD: As above. Continue current medical management   2. PAROXYSMAL ATRIAL FIBRILLATION: Occasional palpitations. She has not been on coumadin recently per recommendations of her hematologist. She will consider Xarelto in the future. EKG with NSR today.   3. HYPERTENSION: BP is controlled today.    4. CAROTID ARTERY DISEASE: Stable by dopplers 2/14. No changes. Repeat 2/15  5. Bilateral foot pain: Will arrange LE arterial dopplers to exclude PAD

## 2013-03-23 ENCOUNTER — Encounter (INDEPENDENT_AMBULATORY_CARE_PROVIDER_SITE_OTHER): Payer: Medicare Other

## 2013-03-23 DIAGNOSIS — I7300 Raynaud's syndrome without gangrene: Secondary | ICD-10-CM | POA: Diagnosis not present

## 2013-03-25 DIAGNOSIS — J309 Allergic rhinitis, unspecified: Secondary | ICD-10-CM | POA: Diagnosis not present

## 2013-03-25 DIAGNOSIS — J45909 Unspecified asthma, uncomplicated: Secondary | ICD-10-CM | POA: Diagnosis not present

## 2013-03-27 ENCOUNTER — Encounter: Payer: Self-pay | Admitting: Cardiovascular Disease

## 2013-03-27 ENCOUNTER — Telehealth: Payer: Self-pay | Admitting: Cardiovascular Disease

## 2013-03-27 DIAGNOSIS — G894 Chronic pain syndrome: Secondary | ICD-10-CM | POA: Diagnosis not present

## 2013-03-27 DIAGNOSIS — Z79899 Other long term (current) drug therapy: Secondary | ICD-10-CM | POA: Diagnosis not present

## 2013-03-27 DIAGNOSIS — M961 Postlaminectomy syndrome, not elsewhere classified: Secondary | ICD-10-CM | POA: Diagnosis not present

## 2013-03-27 DIAGNOSIS — M47817 Spondylosis without myelopathy or radiculopathy, lumbosacral region: Secondary | ICD-10-CM | POA: Diagnosis not present

## 2013-03-27 NOTE — Telephone Encounter (Signed)
LMTCB

## 2013-03-27 NOTE — Telephone Encounter (Signed)
New Problem  Pt is calling about her test results.

## 2013-03-30 NOTE — Telephone Encounter (Signed)
Called patients with results of normal vascular leg studies.

## 2013-04-08 DIAGNOSIS — I2510 Atherosclerotic heart disease of native coronary artery without angina pectoris: Secondary | ICD-10-CM | POA: Diagnosis not present

## 2013-04-08 DIAGNOSIS — J159 Unspecified bacterial pneumonia: Secondary | ICD-10-CM | POA: Diagnosis not present

## 2013-04-08 DIAGNOSIS — R7301 Impaired fasting glucose: Secondary | ICD-10-CM | POA: Diagnosis not present

## 2013-04-08 DIAGNOSIS — D509 Iron deficiency anemia, unspecified: Secondary | ICD-10-CM | POA: Diagnosis not present

## 2013-04-08 DIAGNOSIS — E785 Hyperlipidemia, unspecified: Secondary | ICD-10-CM | POA: Diagnosis not present

## 2013-04-08 DIAGNOSIS — Z0000 Encounter for general adult medical examination without abnormal findings: Secondary | ICD-10-CM | POA: Diagnosis not present

## 2013-04-08 DIAGNOSIS — E039 Hypothyroidism, unspecified: Secondary | ICD-10-CM | POA: Diagnosis not present

## 2013-04-08 LAB — LIPID PANEL
Cholesterol: 157 mg/dL (ref 0–200)
HDL: 42 mg/dL (ref 35–70)
TRIGLYCERIDES: 213 mg/dL — AB (ref 40–160)

## 2013-04-11 ENCOUNTER — Emergency Department (HOSPITAL_COMMUNITY): Payer: Medicare Other

## 2013-04-11 ENCOUNTER — Observation Stay (HOSPITAL_COMMUNITY)
Admission: EM | Admit: 2013-04-11 | Discharge: 2013-04-12 | Disposition: A | Discharge status: Home / Self Care | Payer: Medicare Other | Attending: Internal Medicine | Admitting: Internal Medicine

## 2013-04-11 ENCOUNTER — Encounter (HOSPITAL_COMMUNITY): Payer: Self-pay | Admitting: Emergency Medicine

## 2013-04-11 DIAGNOSIS — R0789 Other chest pain: Principal | ICD-10-CM | POA: Insufficient documentation

## 2013-04-11 DIAGNOSIS — T6391XA Toxic effect of contact with unspecified venomous animal, accidental (unintentional), initial encounter: Secondary | ICD-10-CM | POA: Insufficient documentation

## 2013-04-11 DIAGNOSIS — E039 Hypothyroidism, unspecified: Secondary | ICD-10-CM | POA: Insufficient documentation

## 2013-04-11 DIAGNOSIS — I10 Essential (primary) hypertension: Secondary | ICD-10-CM

## 2013-04-11 DIAGNOSIS — M549 Dorsalgia, unspecified: Secondary | ICD-10-CM | POA: Insufficient documentation

## 2013-04-11 DIAGNOSIS — I2510 Atherosclerotic heart disease of native coronary artery without angina pectoris: Secondary | ICD-10-CM | POA: Insufficient documentation

## 2013-04-11 DIAGNOSIS — Z7982 Long term (current) use of aspirin: Secondary | ICD-10-CM | POA: Insufficient documentation

## 2013-04-11 DIAGNOSIS — T63441A Toxic effect of venom of bees, accidental (unintentional), initial encounter: Secondary | ICD-10-CM

## 2013-04-11 DIAGNOSIS — T63461A Toxic effect of venom of wasps, accidental (unintentional), initial encounter: Secondary | ICD-10-CM | POA: Insufficient documentation

## 2013-04-11 DIAGNOSIS — G8929 Other chronic pain: Secondary | ICD-10-CM | POA: Diagnosis not present

## 2013-04-11 DIAGNOSIS — I4891 Unspecified atrial fibrillation: Secondary | ICD-10-CM | POA: Diagnosis not present

## 2013-04-11 DIAGNOSIS — R079 Chest pain, unspecified: Secondary | ICD-10-CM | POA: Diagnosis not present

## 2013-04-11 DIAGNOSIS — Z79899 Other long term (current) drug therapy: Secondary | ICD-10-CM | POA: Insufficient documentation

## 2013-04-11 DIAGNOSIS — T782XXA Anaphylactic shock, unspecified, initial encounter: Secondary | ICD-10-CM

## 2013-04-11 LAB — BASIC METABOLIC PANEL
BUN: 15 mg/dL (ref 6–23)
CO2: 29 mEq/L (ref 19–32)
Calcium: 9.9 mg/dL (ref 8.4–10.5)
Chloride: 99 mEq/L (ref 96–112)
Creatinine, Ser: 0.49 mg/dL — ABNORMAL LOW (ref 0.50–1.10)
GFR calc Af Amer: >90 mL/min (ref >90)
GFR calc non Af Amer: >90 mL/min (ref >90)
Glucose, Bld: 127 mg/dL — ABNORMAL HIGH (ref 70–99)
Potassium: 3.7 mEq/L (ref 3.5–5.1)
Sodium: 139 mEq/L (ref 135–145)

## 2013-04-11 LAB — CBC
HCT: 41.8 % (ref 36.0–46.0)
Hemoglobin: 13.8 g/dL (ref 12.0–15.0)
MCH: 27.8 pg (ref 26.0–34.0)
MCHC: 33 g/dL (ref 30.0–36.0)
MCV: 84.3 fL (ref 78.0–100.0)
Platelets: 262 10*3/uL (ref 150–400)
RBC: 4.96 MIL/uL (ref 3.87–5.11)
RDW: 13.6 % (ref 11.5–15.5)
WBC: 13.8 10*3/uL — ABNORMAL HIGH (ref 4.0–10.5)

## 2013-04-11 LAB — TROPONIN I: Troponin I: <0.3 ng/mL (ref <0.30)

## 2013-04-11 MED ORDER — ASPIRIN 81 MG PO CHEW
324.0000 mg | CHEWABLE_TABLET | Freq: Once | ORAL | Status: AC
  Administered 2013-04-11: 324 mg via ORAL
  Filled 2013-04-11: qty 4

## 2013-04-11 NOTE — H&P (Signed)
PCP:   Thayer Headings, MD   Chief Complaint:  Cp and bee sting  HPI: 67 yo female with CAD (was suppose to get a stent years ago during cath but did not get one because she was scheduled for a back surgery), chronic back pain, hypercoagulable disorder not on chronic anticoagulation except asa due to allergies to heparin and lovenox (being followed at [facility]) comes in after getting stung twice by a bee today.  Soon after the bee sting she started getting sscp with radiation to her neck with associated nausea no vomiting, diaphoresis, and sob.  This all resolved after taking some benadryl and ntg sl tabs she had at home.  She has no prior history of angina, but does have a history of prev cath told she had obstructive lesions but stent was not placed due to a scheduled back surgery at the time per patient report.  Was told by cardiologist then that if she ever had any further problems they would consider placing a stent then.  No fevers. No le edema or swelling.  No cough.  No hemoptysis.  In past both heparin and lovenox have caused anaphylaxis, she has been able to take arixtra in past without allergy.  Cp free now.  Review of Systems:  Positive and negative as per HPI otherwise all other systems are negative  Past Medical History: Past Medical History  Diagnosis Date  . Asthma     extrinsic; moderate, persistant, nml spirometry 2010, nml CXR 1/08  . Pulmonary embolism     related to back surgery with prior coumadin use, now off  . DVT (deep venous thrombosis)   . PAF (paroxysmal atrial fibrillation)     not on coumadin therapy  . HLD (hyperlipidemia)   . CAD (coronary artery disease)     50% mid LAD, 80% ostial D1 and moderate 80% mid circ by vath 2003  . Antiphospholipid syndrome     with hypercoagulable state  . Coronary heart disease   . Drug allergy     heparin/lovenox  . Erosive gastritis 1994  . Anemia, iron deficiency   . HTN (hypertension)     [name] at [facility], manages  pt.   . Complication of anesthesia     cardiac arrest- in OR, at age 22 (34)y.o. during Scalenotomy   . Anxiety   . Hypothyroidism   . Bladder troubles     REPORTS INFECTIONS ON OCCASION DUE TO URETHRA MEATUS STRICTURE AT BIRTH   . H/O hiatal hernia   . Spondylosis, lumbosacral     ARTHRITIS- OA   . Cancer     basal cell removed fr. L arm   . Blood dyscrasia   . Liver spot     cyst- - no biopsy, but told that its benign   . GERD (gastroesophageal reflux disease)    Past Surgical History  Procedure Laterality Date  . Back surgery      x12 cervical and lumbar thoracic spine surgery  . Total knee arthroplasty      left  . Cleft lip and palate repair      67 yo   . Hysterectomy    . Spina bifida repair      67 yo   . Cardiac catheterization      2005  . Joint replacement    . Tonsillectomy    . Breast surgery      all benign cysts x3   . Abdominal hysterectomy      partial abdominal -  1998    Medications: Prior to Admission medications   Medication Sig Start Date End Date Taking? Authorizing Provider  aspirin 81 MG EC tablet Take 81 mg by mouth daily.    Yes Historical Provider, MD  atorvastatin (LIPITOR) 20 MG tablet Take 1 tablet (20 mg total) by mouth daily. 01/28/13  Yes Wendall Stade, MD  azelastine (ASTELIN) 137 MCG/SPRAY nasal spray Place 1 spray into the nose 2 (two) times daily as needed. Use in each nostril as directed For allergies   Yes Historical Provider, MD  beclomethasone (QVAR) 80 MCG/ACT inhaler Inhale 2 puffs into the lungs 3 (three) times daily.   Yes Historical Provider, MD  Calcium Carbonate-Vitamin D 600-400 MG-UNIT per tablet Take 1 tablet by mouth 2 (two) times daily.     Yes Historical Provider, MD  cyclobenzaprine (FLEXERIL) 10 MG tablet Take 10 mg by mouth 3 (three) times daily as needed. For severe muscle pain   Yes Historical Provider, MD  diazepam (VALIUM) 5 MG tablet Take 5 mg by mouth as needed.    Yes Historical Provider, MD  docusate  sodium (COLACE) 100 MG capsule Take 200 mg by mouth 2 (two) times daily.    Yes Historical Provider, MD  esomeprazole (NEXIUM) 40 MG capsule Take 40 mg by mouth 2 (two) times daily.    Yes Historical Provider, MD  fentaNYL (DURAGESIC - DOSED MCG/HR) 100 MCG/HR Place 1 patch onto the skin every other day.    Yes Historical Provider, MD  ferrous sulfate (FEOSOL) 325 (65 FE) MG tablet Take 325 mg by mouth every other day.    Yes Historical Provider, MD  gabapentin (NEURONTIN) 600 MG tablet Take 900 mg by mouth at bedtime.    Yes Historical Provider, MD  Glucosamine-Chondroitin 750-600 MG TABS Take 2 tablets by mouth 2 (two) times daily.    Yes Historical Provider, MD  HYDROmorphone (DILAUDID) 4 MG tablet Take 4-8 mg by mouth every 4 (four) hours as needed. For pain   Yes Historical Provider, MD  levothyroxine (SYNTHROID, LEVOTHROID) 75 MCG tablet Take 75 mcg by mouth daily before breakfast.    Yes Historical Provider, MD  lidocaine (LIDODERM) 5 % Place 1 patch onto the skin as needed. Remove & Discard patch within 12 hours or as directed by MD   Yes Historical Provider, MD  Loratadine-Pseudoephedrine (CLARITIN-D 12 HOUR PO) Take 1 tablet by mouth 2 (two) times daily.    Yes Historical Provider, MD  metaxalone (SKELAXIN) 800 MG tablet Take 800 mg by mouth every 8 (eight) hours as needed. For muscle pain   Yes Historical Provider, MD  metoprolol (LOPRESSOR) 50 MG tablet Take 50 mg by mouth 2 (two) times daily.   Yes Historical Provider, MD  montelukast (SINGULAIR) 10 MG tablet Take 10 mg by mouth at bedtime.    Yes Historical Provider, MD  Multiple Vitamin (MULTIVITAMIN WITH MINERALS) TABS Take 1 tablet by mouth daily.   Yes Historical Provider, MD  nitroGLYCERIN (NITROSTAT) 0.4 MG SL tablet Place 0.4 mg under the tongue every 5 (five) minutes as needed. For chest pain 08/04/12  Yes Kathleene Hazel, MD  pirbuterol (MAXAIR) 200 MCG/INH inhaler Inhale 2 puffs into the lungs 4 (four) times daily.   Yes  Historical Provider, MD  triamcinolone (NASACORT) 55 MCG/ACT nasal inhaler Place 2 sprays into the nose 2 (two) times daily.    Yes Historical Provider, MD  albuterol (PROVENTIL) (2.5 MG/3ML) 0.083% nebulizer solution Take 2.5 mg by nebulization every 6 (  six) hours as needed. For shortness of breath/wheezing    Historical Provider, MD    Allergies:   Allergies  Allergen Reactions  . Amoxicillin-Pot Clavulanate Diarrhea    "patient stated that she can take this medication"  . Enoxaparin Sodium     REACTION: thrombocytopenia  . Heparin     REACTION: thrombocytopenia  . Lactose Intolerance (Gi)   . Other     Sensitive to dye in Betadine & Chlorohexadine   . Pork-Derived Products   . Povidone     Sensitivity- but if its wiped off she is able to tolerate betadine   . Indomethacin Rash    Social History:  reports that she quit smoking about 40 years ago. She has never used smokeless tobacco. She reports that she does not drink alcohol or use illicit drugs.  Family History: Family History  Problem Relation Age of Onset  . Coronary artery disease Sister     MI     Physical Exam: Filed Vitals:   04/11/13 1902 04/11/13 2057  BP: 154/73 152/73  Pulse: 99 90  Temp: 97.8 F (36.6 C) 98.4 F (36.9 C)  TempSrc: Oral Oral  Resp: 14 20  SpO2: 95% 100%   General appearance: alert, cooperative and no distress Head: Normocephalic, without obvious abnormality, atraumatic Eyes: negative Nose: Nares normal. Septum midline. Mucosa normal. No drainage or sinus tenderness. Neck: no JVD and supple, symmetrical, trachea midline Lungs: clear to auscultation bilaterally Heart: regular rate and rhythm, S1, S2 normal, no murmur, click, rub or gallop Abdomen: soft, non-tender; bowel sounds normal; no masses,  no organomegaly Extremities: extremities normal, atraumatic, no cyanosis or edema Pulses: 2+ and symmetric Skin: Skin color, texture, turgor normal. No rashes or lesions Neurologic:  Grossly normal    Labs on Admission:   Recent Labs  04/11/13 2016  NA 139  K 3.7  CL 99  CO2 29  GLUCOSE 127*  BUN 15  CREATININE 0.49*  CALCIUM 9.9    Recent Labs  04/11/13 2016  WBC 13.8*  HGB 13.8  HCT 41.8  MCV 84.3  PLT 262    Recent Labs  04/11/13 2016  TROPONINI <0.30   Radiological Exams on Admission: Dg Chest 2 View  04/11/2013   *RADIOLOGY REPORT*  Clinical Data: Chest and back pain, history asthma, pulmonary embolism, coronary artery disease, hypertension  CHEST - 2 VIEW  Comparison: 02/12/2013  Findings: Rotated to the right. Normal heart size, mediastinal contours, and pulmonary vascularity. Right basilar atelectasis versus scarring unchanged. Remaining lungs clear. No pleural effusion or pneumothorax. Prior cervical spine and lower thoracic spinal fusions.  IMPRESSION: Chronic right basilar atelectasis versus scarring. Otherwise negative exam.   Original Report Authenticated By: Ulyses Southward, M.D.    Assessment/Plan 67 yo female with atypical cp associated with bee sting with h/o CAD only being medically managed but may be candidate for intervention  Principal Problem:   Chest pain, atypical Active Problems:   CAD, NATIVE VESSEL   PAROXYSMAL ATRIAL FIBRILLATION   BACK PAIN, CHRONIC   Bee sting reaction  Asa.  Romi.  Echo in am.  Clarify home meds.  Her chronic anticoagulation is being addressed by her physicians at [facility].  Will need to call cards in am to set up close f/u either inpatient or outpatient for further intervention if needed.    Baila Rouse A 04/11/2013, 10:47 PM

## 2013-04-11 NOTE — ED Provider Notes (Signed)
History    67 year old female presenting with chest pain and shortness of breath. This started shortly after being stung by wasp in her left thigh. Describes a squeezing sensation in her chest with associated nausea, diaphoresis, and lightheadedness. Radiation of pain to her neck. Patient took 2 doses of nitroglycerin with some relief. Symptoms completely resolved over the course of about an hour. No complaints upon my evaluation. Patient has a known history of coronary artery disease but never had symptoms like this before. Also history of antiphospholipid syndrome, PAF and DVT but currently not anticoagulated. She is on a baby aspirin but did not take this yet today. Previous cath years ago with CAD and apparently bypass considered but ultimately managed medically. Most recent stress test seems to be 07/2012. Showed no ischemia, normal LV function. Last echo in 11/2009 fairly unremarkable aside from mild LVH and mild MR w/ mildly thickened mitral valve leaflets. Routine appointment with cardiologist 5/30 with no pressing issues. Recent pneumonia which has recovered well from.   PCP: Lynnda Shields  CSN: [id number]  Arrival date & time 04/11/13  1851   First MD Initiated Contact with Patient 04/11/13 1907      Chief Complaint  Patient presents with  . Insect Bite  . Chest Pain  . Shortness of Breath    (Consider location/radiation/quality/duration/timing/severity/associated sxs/prior treatment) HPI  Past Medical History  Diagnosis Date  . Asthma     extrinsic; moderate, persistant, nml spirometry 2010, nml CXR 1/08  . Pulmonary embolism     related to back surgery with prior coumadin use, now off  . DVT (deep venous thrombosis)   . PAF (paroxysmal atrial fibrillation)     not on coumadin therapy  . HLD (hyperlipidemia)   . CAD (coronary artery disease)     50% mid LAD, 80% ostial D1 and moderate 80% mid circ by vath 2003  . Antiphospholipid syndrome     with  hypercoagulable state  . Coronary heart disease   . Drug allergy     heparin/lovenox  . Erosive gastritis 1994  . Anemia, iron deficiency   . HTN (hypertension)     [name] at [facility], manages pt.   . Complication of anesthesia     cardiac arrest- in OR, at age 52 (69)y.o. during Scalenotomy   . Anxiety   . Hypothyroidism   . Bladder troubles     REPORTS INFECTIONS ON OCCASION DUE TO URETHRA MEATUS STRICTURE AT BIRTH   . H/O hiatal hernia   . Spondylosis, lumbosacral     ARTHRITIS- OA   . Cancer     basal cell removed fr. L arm   . Blood dyscrasia   . Liver spot     cyst- - no biopsy, but told that its benign   . GERD (gastroesophageal reflux disease)     Past Surgical History  Procedure Laterality Date  . Back surgery      x12 cervical and lumbar thoracic spine surgery  . Total knee arthroplasty      left  . Cleft lip and palate repair      67 yo   . Hysterectomy    . Spina bifida repair      67 yo   . Cardiac catheterization      2005  . Joint replacement    . Tonsillectomy    . Breast surgery      all benign cysts x3   . Abdominal hysterectomy      partial abdominal -  1998    Family History  Problem Relation Age of Onset  . Coronary artery disease Sister     MI     History  Substance Use Topics  . Smoking status: Former Smoker    Quit date: 10/22/1972  . Smokeless tobacco: Never Used     Comment: no smoking   . Alcohol Use: No    OB History   Grav Para Term Preterm Abortions TAB SAB Ect Mult Living                  Review of Systems  All systems reviewed and negative, other than as noted in HPI.   Allergies  Amoxicillin-pot clavulanate; Enoxaparin sodium; Heparin; Lactose intolerance (gi); Other; Pork-derived products; Povidone; and Indomethacin  Home Medications   Current Outpatient Rx  Name  Route  Sig  Dispense  Refill  . aspirin 81 MG EC tablet   Oral   Take 81 mg by mouth daily.          Marland Kitchen atorvastatin (LIPITOR) 20 MG  tablet   Oral   Take 1 tablet (20 mg total) by mouth daily.   30 tablet   5   . azelastine (ASTELIN) 137 MCG/SPRAY nasal spray   Nasal   Place 1 spray into the nose 2 (two) times daily as needed. Use in each nostril as directed For allergies         . beclomethasone (QVAR) 80 MCG/ACT inhaler   Inhalation   Inhale 2 puffs into the lungs 3 (three) times daily.         . Calcium Carbonate-Vitamin D 600-400 MG-UNIT per tablet   Oral   Take 1 tablet by mouth 2 (two) times daily.           . cyclobenzaprine (FLEXERIL) 10 MG tablet   Oral   Take 10 mg by mouth 3 (three) times daily as needed. For severe muscle pain         . diazepam (VALIUM) 5 MG tablet   Oral   Take 5 mg by mouth as needed.          . docusate sodium (COLACE) 100 MG capsule   Oral   Take 200 mg by mouth 2 (two) times daily.          Marland Kitchen esomeprazole (NEXIUM) 40 MG capsule   Oral   Take 40 mg by mouth 2 (two) times daily.          . fentaNYL (DURAGESIC - DOSED MCG/HR) 100 MCG/HR   Transdermal   Place 1 patch onto the skin every other day.          . ferrous sulfate (FEOSOL) 325 (65 FE) MG tablet   Oral   Take 325 mg by mouth every other day.          . gabapentin (NEURONTIN) 600 MG tablet   Oral   Take 900 mg by mouth at bedtime.          . Glucosamine-Chondroitin 750-600 MG TABS   Oral   Take 2 tablets by mouth 2 (two) times daily.          Marland Kitchen HYDROmorphone (DILAUDID) 4 MG tablet   Oral   Take 4-8 mg by mouth every 4 (four) hours as needed. For pain         . levothyroxine (SYNTHROID, LEVOTHROID) 75 MCG tablet   Oral   Take 75 mcg by mouth daily before breakfast.          .  lidocaine (LIDODERM) 5 %   Transdermal   Place 1 patch onto the skin as needed. Remove & Discard patch within 12 hours or as directed by MD         . Loratadine-Pseudoephedrine (CLARITIN-D 12 HOUR PO)   Oral   Take 1 tablet by mouth 2 (two) times daily.          . metaxalone (SKELAXIN) 800 MG  tablet   Oral   Take 800 mg by mouth every 8 (eight) hours as needed. For muscle pain         . metoprolol (LOPRESSOR) 50 MG tablet   Oral   Take 50 mg by mouth 2 (two) times daily.         . montelukast (SINGULAIR) 10 MG tablet   Oral   Take 10 mg by mouth at bedtime.          . Multiple Vitamin (MULTIVITAMIN WITH MINERALS) TABS   Oral   Take 1 tablet by mouth daily.         . nitroGLYCERIN (NITROSTAT) 0.4 MG SL tablet   Sublingual   Place 0.4 mg under the tongue every 5 (five) minutes as needed. For chest pain         . pirbuterol (MAXAIR) 200 MCG/INH inhaler   Inhalation   Inhale 2 puffs into the lungs 4 (four) times daily.         Marland Kitchen triamcinolone (NASACORT) 55 MCG/ACT nasal inhaler   Nasal   Place 2 sprays into the nose 2 (two) times daily.          Marland Kitchen albuterol (PROVENTIL) (2.5 MG/3ML) 0.083% nebulizer solution   Nebulization   Take 2.5 mg by nebulization every 6 (six) hours as needed. For shortness of breath/wheezing           BP 152/73  Pulse 90  Temp(Src) 98.4 F (36.9 C) (Oral)  Resp 20  SpO2 100%  Physical Exam  Nursing note and vitals reviewed. Constitutional: She appears well-developed. No distress.  Laying in bed. NAD.   HENT:  Head: Normocephalic and atraumatic.  Eyes: Conjunctivae are normal. Right eye exhibits no discharge. Left eye exhibits no discharge.  Neck: Neck supple.  Cardiovascular: Normal rate, regular rhythm and normal heart sounds.  Exam reveals no gallop and no friction rub.   No murmur heard. Pulmonary/Chest: Effort normal and breath sounds normal. No respiratory distress. She exhibits no tenderness.  Abdominal: Soft. She exhibits no distension. There is no tenderness.  Musculoskeletal: She exhibits no edema and no tenderness.  Lower extremities symmetric as compared to each other. No calf tenderness. Negative Homan's. No palpable cords.   Neurological: She is alert.  Skin: Skin is warm. She is not diaphoretic.   Punctate erythematous lesion medial L thigh w/ 2-3 cm of surrounding lighter erythema and induration.   Psychiatric: She has a normal mood and affect. Her behavior is normal. Thought content normal.    ED Course  Procedures (including critical care time)  Labs Reviewed  CBC - Abnormal; Notable for the following:    WBC 13.8 (*)    All other components within normal limits  BASIC METABOLIC PANEL - Abnormal; Notable for the following:    Glucose, Bld 127 (*)    Creatinine, Ser 0.49 (*)    All other components within normal limits  TROPONIN I   Dg Chest 2 View  04/11/2013   *RADIOLOGY REPORT*  Clinical Data: Chest and back pain, history asthma, pulmonary embolism, coronary  artery disease, hypertension  CHEST - 2 VIEW  Comparison: 02/12/2013  Findings: Rotated to the right. Normal heart size, mediastinal contours, and pulmonary vascularity. Right basilar atelectasis versus scarring unchanged. Remaining lungs clear. No pleural effusion or pneumothorax. Prior cervical spine and lower thoracic spinal fusions.  IMPRESSION: Chronic right basilar atelectasis versus scarring. Otherwise negative exam.   Original Report Authenticated By: Ulyses Southward, M.D.    EKG:  Rhythm: normal sinus Vent. rate 99 BPM PR interval 152 ms QRS duration 86 ms QT/QTc 348/447 ms ST segments: NS ST changes    1. Chest pain       MDM  67yF with CP and SOB. Currently resolved. May be related to antecedent wasp sting, but wouldn't necessarily expect CP with radiation to the neck. Associated symptoms of nausea and diaphoresis concerning as well, particularly in pt with known CAD. EKG sinus with no overt ischemic changes. CXR with patchy R sided infiltrate. Recent pneumonia. Interval improvement from prior imaging. Presenting symptoms not consistent with infectious process. Does have mild leukocytosis but this is nonspecific. Doubt PE or dissection. Initial trop normal. Received ASA. Will admit for r/o.         Raeford Razor, MD 04/11/13 2141

## 2013-04-11 NOTE — ED Notes (Addendum)
Pt reports being stung by a wasp twice about 1745. Pt reports having a history of being stung by a bee, her face turned red and experienced generalized swelling. Pt reports today that she developed shortness of breath and chest pain after being stung. Pt also reports nausea and diarrhea after being stung. Pt reports having a history of coronary artery disease and took nitro x 2, which relieved pain.

## 2013-04-12 ENCOUNTER — Encounter (HOSPITAL_COMMUNITY): Payer: Self-pay | Admitting: *Deleted

## 2013-04-12 DIAGNOSIS — T782XXA Anaphylactic shock, unspecified, initial encounter: Secondary | ICD-10-CM

## 2013-04-12 DIAGNOSIS — I4891 Unspecified atrial fibrillation: Secondary | ICD-10-CM | POA: Diagnosis not present

## 2013-04-12 DIAGNOSIS — I2510 Atherosclerotic heart disease of native coronary artery without angina pectoris: Secondary | ICD-10-CM | POA: Diagnosis not present

## 2013-04-12 DIAGNOSIS — R079 Chest pain, unspecified: Secondary | ICD-10-CM | POA: Diagnosis not present

## 2013-04-12 LAB — TROPONIN I
Troponin I: <0.3 ng/mL (ref <0.30)
Troponin I: <0.3 ng/mL (ref <0.30)

## 2013-04-12 MED ORDER — ASPIRIN 81 MG PO CHEW
81.0000 mg | CHEWABLE_TABLET | Freq: Every day | ORAL | Status: DC
  Administered 2013-04-12: 81 mg via ORAL
  Filled 2013-04-12: qty 1

## 2013-04-12 MED ORDER — LEVOTHYROXINE SODIUM 75 MCG PO TABS
75.0000 ug | ORAL_TABLET | Freq: Every day | ORAL | Status: DC
  Administered 2013-04-12: 75 ug via ORAL
  Filled 2013-04-12 (×2): qty 1

## 2013-04-12 MED ORDER — ONDANSETRON HCL 4 MG PO TABS: 4.0000 mg | ORAL_TABLET | Freq: Four times a day (QID) | ORAL | Status: DC | PRN

## 2013-04-12 MED ORDER — SODIUM CHLORIDE 0.9 % IJ SOLN: 3.0000 mL | Freq: Two times a day (BID) | INTRAMUSCULAR | Status: DC

## 2013-04-12 MED ORDER — EPINEPHRINE 0.3 MG/0.3ML IJ SOAJ: 0.3000 mg | Freq: Once | INTRAMUSCULAR | Status: DC

## 2013-04-12 MED ORDER — GABAPENTIN 300 MG PO CAPS
900.0000 mg | ORAL_CAPSULE | Freq: Every day | ORAL | Status: DC
  Administered 2013-04-12: 900 mg via ORAL
  Filled 2013-04-12 (×2): qty 3

## 2013-04-12 MED ORDER — ADULT MULTIVITAMIN W/MINERALS CH
1.0000 | ORAL_TABLET | Freq: Every day | ORAL | Status: DC
  Administered 2013-04-12: 1 via ORAL
  Filled 2013-04-12: qty 1

## 2013-04-12 MED ORDER — SODIUM CHLORIDE 0.9 % IV SOLN: 250.0000 mL | INTRAVENOUS | Status: DC | PRN

## 2013-04-12 MED ORDER — CEPHALEXIN 500 MG PO CAPS: 500.0000 mg | ORAL_CAPSULE | Freq: Two times a day (BID) | ORAL | Status: DC

## 2013-04-12 MED ORDER — CEPHALEXIN 500 MG PO CAPS: 500.0000 mg | ORAL_CAPSULE | Freq: Four times a day (QID) | ORAL | Status: DC

## 2013-04-12 MED ORDER — SODIUM CHLORIDE 0.9 % IJ SOLN
3.0000 mL | Freq: Two times a day (BID) | INTRAMUSCULAR | Status: DC
  Administered 2013-04-12 (×2): 3 mL via INTRAVENOUS

## 2013-04-12 MED ORDER — ATORVASTATIN CALCIUM 20 MG PO TABS
20.0000 mg | ORAL_TABLET | Freq: Every day | ORAL | Status: DC
  Administered 2013-04-12: 20 mg via ORAL
  Filled 2013-04-12: qty 1

## 2013-04-12 MED ORDER — METOPROLOL TARTRATE 50 MG PO TABS
50.0000 mg | ORAL_TABLET | Freq: Two times a day (BID) | ORAL | Status: DC
  Administered 2013-04-12 (×2): 50 mg via ORAL
  Filled 2013-04-12 (×3): qty 1

## 2013-04-12 MED ORDER — FERROUS SULFATE 325 (65 FE) MG PO TABS
325.0000 mg | ORAL_TABLET | ORAL | Status: DC
  Administered 2013-04-12: 325 mg via ORAL
  Filled 2013-04-12: qty 1

## 2013-04-12 MED ORDER — FENTANYL 100 MCG/HR TD PT72
100.0000 ug | MEDICATED_PATCH | TRANSDERMAL | Status: DC
  Administered 2013-04-12: 100 ug via TRANSDERMAL
  Filled 2013-04-12: qty 1

## 2013-04-12 MED ORDER — SODIUM CHLORIDE 0.9 % IJ SOLN: 3.0000 mL | INTRAMUSCULAR | Status: DC | PRN

## 2013-04-12 MED ORDER — ONDANSETRON HCL 4 MG/2ML IJ SOLN: 4.0000 mg | Freq: Four times a day (QID) | INTRAMUSCULAR | Status: DC | PRN

## 2013-04-12 MED ORDER — HYDROMORPHONE HCL PF 1 MG/ML IJ SOLN
1.0000 mg | INTRAMUSCULAR | Status: DC | PRN
Start: 2013-04-12 — End: 2013-04-12
  Administered 2013-04-12: 1 mg via INTRAVENOUS
  Filled 2013-04-12: qty 1

## 2013-04-12 NOTE — Progress Notes (Signed)
  Echocardiogram 2D Echocardiogram has been performed.  Nestor Ramp M 04/12/2013, 11:50 AM

## 2013-04-12 NOTE — Discharge Summary (Signed)
Physician Discharge Summary  Latoya [name] [facility] ZOX:[id number] DOB: [birth year] DOA: 04/11/2013  PCP: Thayer Headings, MD  Admit date: 04/11/2013 Discharge date: 04/12/2013  Recommendations for Outpatient Follow-up:  1. Please followup on two-dimensional echocardiogram, done/22/2014. 2. Recommend followup with cardiology within one week.  Discharge Diagnoses:  Principal Problem:   Chest pain, atypical Active Problems:   CAD, NATIVE VESSEL   PAROXYSMAL ATRIAL FIBRILLATION   BACK PAIN, CHRONIC   Anaphylaxis, mild, due to wasp envenomation   Discharge Condition: Improved.  Diet recommendation: Low-sodium, heart healthy.  History of present illness:  Latoya [name] is an 67 y.o. female with a PMH of CAD, chronic back pain, hypercoagulable disorder not on chronic anti-coagulation except for aspirin secondary to heparin and Lovenox allergy, who was admitted to the hospital on 04/11/2013 after getting stung by a wasp twice. Soon after the sting, she developed substernal chest pain with radiation to the neck associated with nausea, diaphoresis, diarrhea and dyspnea. Symptoms resolved after taking nitroglycerin and Benadryl. She was chest pain-free on admission.  Hospital Course by problem:  Principal Problem:  Chest pain, atypical in the setting of CAD, native vessel  -Admitted to telemetry and cardiac markers cycled every 6 hours x3 sets, troponins negative for all 3 sets.  -12-lead EKG unremarkable with no ST/T-wave abnormalities.  -Two-dimensional echocardiogram done prior to discharge.  -Continue aspirin therapy as well as empiric statin and beta blocker.  -Chest pain-free at present, stable for discharge with close cardiology followup.  -Theodore Demark, PA, will arrange followup. Active Problems:  PAROXYSMAL ATRIAL FIBRILLATION  -Maintaining normal sinus rhythm on telemetry.  BACK PAIN, CHRONIC  -Continue gabapentin. Can resume usual home meds at discharge. Anaphylaxis, mild, due to  wasp envenomation -Resolved after being given Benadryl.  -Given a prescription for Keflex to use if area around the bite becomes infected.  -Given a prescription for an epi pen.  Hypothyroidism  -Continue Synthroid.  Procedures:  Two-dimensional echocardiogram 04/12/2013: Results pending.  Consultations:  None.  Discharge Exam: Filed Vitals:   04/12/13 0641  BP: 142/79  Pulse: 100  Temp: 97.4 F (36.3 C)  Resp: 16   Filed Vitals:   04/11/13 2300 04/11/13 2359 04/12/13 0103 04/12/13 0641  BP: 148/68  150/75 142/79  Pulse: 107  96 100  Temp:  98.1 F (36.7 C) 98 F (36.7 C) 97.4 F (36.3 C)  TempSrc:  Oral Oral Oral  Resp: 20  18 16   Height:   5' 2.5" (1.588 m)   Weight:   77 kg (169 lb 12.1 oz)   SpO2: 95%  100% 100%    Gen:  NAD Cardiovascular:  RRR, No M/R/G Respiratory: Lungs CTAB Gastrointestinal: Abdomen soft, NT/ND with normal active bowel sounds. Extremities: No C/E/C.  Erythematous area around wasp sting, left inner thigh, approximately 3 cm in diameter.   Discharge Instructions      Discharge Orders   Future Orders Complete By Expires     Call MD for:  persistant nausea and vomiting  As directed     Call MD for:  severe uncontrolled pain  As directed     Call MD for:  temperature >100.4  As directed     Diet - low sodium heart healthy  As directed     Discharge instructions  As directed     Comments:      You were cared for by Dr. Hillery Aldo  (a hospitalist) during your hospital stay. If you have any questions about your discharge medications or  the care you received while you were in the hospital after you are discharged, you can call the unit and ask to speak with the hospitalist on call if the hospitalist that took care of you is not available. Once you are discharged, your primary care physician will handle any further medical issues. Please note that NO REFILLS for any discharge medications will be authorized once you are discharged, as it is  imperative that you return to your primary care physician (or establish a relationship with a primary care physician if you do not have one) for your aftercare needs so that they can reassess your need for medications and monitor your lab values.  Any outstanding tests can be reviewed by your PCP at your follow up visit.  If you do not have a primary care physician, you can call [phone] for a physician referral.  It is highly recommended that you obtain a PCP for hospital follow up.    Increase activity slowly  As directed         Medication List    TAKE these medications       albuterol (2.5 MG/3ML) 0.083% nebulizer solution  Commonly known as:  PROVENTIL  Take 2.5 mg by nebulization every 6 (six) hours as needed. For shortness of breath/wheezing     aspirin 81 MG EC tablet  Take 81 mg by mouth daily.     ASTELIN 137 MCG/SPRAY nasal spray  Generic drug:  azelastine  Place 1 spray into the nose 2 (two) times daily as needed. Use in each nostril as directed  For allergies     atorvastatin 20 MG tablet  Commonly known as:  LIPITOR  Take 1 tablet (20 mg total) by mouth daily.     beclomethasone 80 MCG/ACT inhaler  Commonly known as:  QVAR  Inhale 2 puffs into the lungs 3 (three) times daily.     Calcium Carbonate-Vitamin D 600-400 MG-UNIT per tablet  Take 1 tablet by mouth 2 (two) times daily.     cephALEXin 500 MG capsule  Commonly known as:  KEFLEX  Take 1 capsule (500 mg total) by mouth 2 (two) times daily.     CLARITIN-D 12 HOUR PO  Take 1 tablet by mouth 2 (two) times daily.     cyclobenzaprine 10 MG tablet  Commonly known as:  FLEXERIL  Take 10 mg by mouth 3 (three) times daily as needed. For severe muscle pain     diazepam 5 MG tablet  Commonly known as:  VALIUM  Take 5 mg by mouth as needed.     docusate sodium 100 MG capsule  Commonly known as:  COLACE  Take 200 mg by mouth 2 (two) times daily.     EPINEPHrine 0.3 mg/0.3 mL Devi  Commonly known as:   EPIPEN  Inject 0.3 mLs (0.3 mg total) into the muscle once.     esomeprazole 40 MG capsule  Commonly known as:  NEXIUM  Take 40 mg by mouth 2 (two) times daily.     fentaNYL 100 MCG/HR  Commonly known as:  DURAGESIC - dosed mcg/hr  Place 1 patch onto the skin every other day.     FEOSOL 325 (65 FE) MG tablet  Generic drug:  ferrous sulfate  Take 325 mg by mouth every other day.     Glucosamine-Chondroitin 750-600 MG Tabs  Take 2 tablets by mouth 2 (two) times daily.     HYDROmorphone 4 MG tablet  Commonly known as:  DILAUDID  Take 4-8 mg by mouth every 4 (four) hours as needed. For pain     levothyroxine 75 MCG tablet  Commonly known as:  SYNTHROID, LEVOTHROID  Take 75 mcg by mouth daily before breakfast.     lidocaine 5 %  Commonly known as:  LIDODERM  Place 1 patch onto the skin as needed. Remove & Discard patch within 12 hours or as directed by MD     metaxalone 800 MG tablet  Commonly known as:  SKELAXIN  Take 800 mg by mouth every 8 (eight) hours as needed. For muscle pain     metoprolol 50 MG tablet  Commonly known as:  LOPRESSOR  Take 50 mg by mouth 2 (two) times daily.     montelukast 10 MG tablet  Commonly known as:  SINGULAIR  Take 10 mg by mouth at bedtime.     multivitamin with minerals Tabs  Take 1 tablet by mouth daily.     NEURONTIN 600 MG tablet  Generic drug:  gabapentin  Take 900 mg by mouth at bedtime.     nitroGLYCERIN 0.4 MG SL tablet  Commonly known as:  NITROSTAT  Place 0.4 mg under the tongue every 5 (five) minutes as needed. For chest pain     pirbuterol 200 MCG/INH inhaler  Commonly known as:  MAXAIR  Inhale 2 puffs into the lungs 4 (four) times daily.     triamcinolone 55 MCG/ACT nasal inhaler  Commonly known as:  NASACORT  Place 2 sprays into the nose 2 (two) times daily.       Follow-up Information   Follow up with [facility], MD. (Will call you with an appt time.  Call him if you do not hear from his office in  24-48 hours.)    Contact information:   [address] [place] [address] [phone]        The results of significant diagnostics from this hospitalization (including imaging, microbiology, ancillary and laboratory) are listed below for reference.    Significant Diagnostic Studies: Dg Chest 2 View  04/11/2013   *RADIOLOGY REPORT*  Clinical Data: Chest and back pain, history asthma, pulmonary embolism, coronary artery disease, hypertension  CHEST - 2 VIEW  Comparison: 02/12/2013  Findings: Rotated to the right. Normal heart size, mediastinal contours, and pulmonary vascularity. Right basilar atelectasis versus scarring unchanged. Remaining lungs clear. No pleural effusion or pneumothorax. Prior cervical spine and lower thoracic spinal fusions.  IMPRESSION: Chronic right basilar atelectasis versus scarring. Otherwise negative exam.   Original Report Authenticated By: Ulyses Southward, M.D.    Labs:  Basic Metabolic Panel:  Recent Labs Lab 04/11/13 2016  NA 139  K 3.7  CL 99  CO2 29  GLUCOSE 127*  BUN 15  CREATININE 0.49*  CALCIUM 9.9   GFR Estimated Creatinine Clearance: 67.3 ml/min (by C-G formula based on Cr of 0.49).  CBC:  Recent Labs Lab 04/11/13 2016  WBC 13.8*  HGB 13.8  HCT 41.8  MCV 84.3  PLT 262   Cardiac Enzymes:  Recent Labs Lab 04/11/13 2016 04/12/13 0123 04/12/13 0625  TROPONINI <0.30 <0.30 <0.30   Microbiology No results found for this or any previous visit (from the past 240 hour(s)).  Time coordinating discharge: 35 minutes.  Signed:  RAMA,CHRISTINA  Pager [phone] [facility] 04/12/2013, 11:22 AM

## 2013-04-13 ENCOUNTER — Telehealth: Payer: Self-pay | Admitting: Cardiovascular Disease

## 2013-04-13 NOTE — Telephone Encounter (Signed)
Patient contacted regarding discharge from [facility] on 6/22.  Patient understands to follow up with provider Tereso Newcomer, PA on 7/14 at 2:40 at [facility]. Patient understands discharge instructions? Yes Patient understands medications and regiment? Yes Patient understands to bring all medications to this visit? Yes  Patient states she is feeling better, just tired from not getting adequate sleep while hospitalized.

## 2013-04-13 NOTE — Telephone Encounter (Signed)
New problem   I have an after hour asap appt request from rhonda/Pa-please advise-1st available is w/ s.weaver 05/04/13

## 2013-04-15 DIAGNOSIS — J309 Allergic rhinitis, unspecified: Secondary | ICD-10-CM | POA: Diagnosis not present

## 2013-04-15 DIAGNOSIS — J45909 Unspecified asthma, uncomplicated: Secondary | ICD-10-CM | POA: Diagnosis not present

## 2013-04-15 DIAGNOSIS — T6391XA Toxic effect of contact with unspecified venomous animal, accidental (unintentional), initial encounter: Secondary | ICD-10-CM | POA: Diagnosis not present

## 2013-04-16 DIAGNOSIS — T6391XA Toxic effect of contact with unspecified venomous animal, accidental (unintentional), initial encounter: Secondary | ICD-10-CM | POA: Diagnosis not present

## 2013-04-22 DIAGNOSIS — J40 Bronchitis, not specified as acute or chronic: Secondary | ICD-10-CM | POA: Diagnosis not present

## 2013-04-22 DIAGNOSIS — I2510 Atherosclerotic heart disease of native coronary artery without angina pectoris: Secondary | ICD-10-CM | POA: Diagnosis not present

## 2013-04-22 DIAGNOSIS — E785 Hyperlipidemia, unspecified: Secondary | ICD-10-CM | POA: Diagnosis not present

## 2013-04-22 DIAGNOSIS — E039 Hypothyroidism, unspecified: Secondary | ICD-10-CM | POA: Diagnosis not present

## 2013-04-22 DIAGNOSIS — J159 Unspecified bacterial pneumonia: Secondary | ICD-10-CM | POA: Diagnosis not present

## 2013-04-22 DIAGNOSIS — R7301 Impaired fasting glucose: Secondary | ICD-10-CM | POA: Diagnosis not present

## 2013-04-22 LAB — CBC AND DIFFERENTIAL
HEMATOCRIT: 43 % (ref 36–46)
Hemoglobin: 14.5 g/dL (ref 12.0–16.0)
PLATELETS: 263 10*3/uL (ref 150–399)
WBC: 9.8 10^3/mL

## 2013-04-22 LAB — BASIC METABOLIC PANEL
BUN: 12 mg/dL (ref 4–21)
Creatinine: 0.7 mg/dL (ref 0.5–1.1)
GLUCOSE: 112 mg/dL
Potassium: 4.3 mmol/L (ref 3.4–5.3)
Sodium: 136 mmol/L — AB (ref 137–147)

## 2013-04-22 LAB — HEPATIC FUNCTION PANEL
ALT: 18 U/L (ref 7–35)
AST: 15 U/L (ref 13–35)
Alkaline Phosphatase: 97 U/L (ref 25–125)
BILIRUBIN, TOTAL: 0.4 mg/dL

## 2013-04-22 LAB — HEMOGLOBIN A1C: HEMOGLOBIN A1C: 6.1

## 2013-04-25 ENCOUNTER — Other Ambulatory Visit: Payer: Self-pay | Admitting: Cardiovascular Disease

## 2013-04-28 DIAGNOSIS — G894 Chronic pain syndrome: Secondary | ICD-10-CM | POA: Diagnosis not present

## 2013-04-28 DIAGNOSIS — Z79899 Other long term (current) drug therapy: Secondary | ICD-10-CM | POA: Diagnosis not present

## 2013-04-28 DIAGNOSIS — M961 Postlaminectomy syndrome, not elsewhere classified: Secondary | ICD-10-CM | POA: Diagnosis not present

## 2013-04-28 DIAGNOSIS — M47817 Spondylosis without myelopathy or radiculopathy, lumbosacral region: Secondary | ICD-10-CM | POA: Diagnosis not present

## 2013-05-04 ENCOUNTER — Ambulatory Visit (INDEPENDENT_AMBULATORY_CARE_PROVIDER_SITE_OTHER): Payer: Medicare Other | Admitting: Physician Assistant

## 2013-05-04 ENCOUNTER — Encounter: Payer: Self-pay | Admitting: Physician Assistant

## 2013-05-04 VITALS — BP 142/88 | HR 75 | Ht 62.0 in | Wt 173.8 lb

## 2013-05-04 DIAGNOSIS — I4891 Unspecified atrial fibrillation: Secondary | ICD-10-CM

## 2013-05-04 DIAGNOSIS — I6523 Occlusion and stenosis of bilateral carotid arteries: Secondary | ICD-10-CM

## 2013-05-04 DIAGNOSIS — I6529 Occlusion and stenosis of unspecified carotid artery: Secondary | ICD-10-CM

## 2013-05-04 DIAGNOSIS — E785 Hyperlipidemia, unspecified: Secondary | ICD-10-CM

## 2013-05-04 DIAGNOSIS — D6859 Other primary thrombophilia: Secondary | ICD-10-CM

## 2013-05-04 DIAGNOSIS — I2510 Atherosclerotic heart disease of native coronary artery without angina pectoris: Secondary | ICD-10-CM

## 2013-05-04 DIAGNOSIS — R079 Chest pain, unspecified: Secondary | ICD-10-CM

## 2013-05-04 DIAGNOSIS — I480 Paroxysmal atrial fibrillation: Secondary | ICD-10-CM

## 2013-05-04 DIAGNOSIS — I658 Occlusion and stenosis of other precerebral arteries: Secondary | ICD-10-CM

## 2013-05-04 DIAGNOSIS — I10 Essential (primary) hypertension: Secondary | ICD-10-CM | POA: Diagnosis not present

## 2013-05-04 NOTE — Progress Notes (Signed)
[address] [place], [place]  [address] Phone: [phone] Fax:  [phone]  Date:  05/04/2013   ID:  Latoya [name], DOB [birth year], MRN [id number]  PCP:  Thayer Headings, MD  Cardiologist:  Dr. Verne Carrow     History of Present Illness: Latoya [name] is a 67 y.o. female who returns for f/u after a recent admission to the hospital.  She has a hx of CAD, HTN, HL, anti-phospholipid Ab syndrome, GERD, asthma, parox AFib, hypothyroidism, Raynaud's.  LHC 2003: oLM 30%, mLAD 50%, Dx 80%, CFX 80%.  CABG considered but ultimately med Rx recommended.  Hematologist at [facility] (Dr. Graylon Gunning) has told patient she no longer needs coumadin.  She has allergies to heparin and Lovenox.  Amiodarone stopped in the past.  Myoview 10/13: apical thinning, no ischemia, EF 47%.  Carotid US 2/14: 40-59% bilat ICA.  Last seen by Dr. Verne Carrow 5/14.  ABIs 6/14: R 1.3, L 1.3.     She was admitted 6/21 6/22 with substernal chest pain soon after being stung by a Hornet x4. She nitroglycerin and Benadryl with relief. Cardiac markers remained normal. Echocardiogram 6/14: EF 50-55%, LV wall motion could not be assessed.  She tells me her CP was a heaviness with radiation to her neck, assoc dyspnea, nausea and diaphoresis.  She took benadryl 50 mg and NTG x 2 with relief.  She has had no recurrence of CP.  She denies syncope.  No orthopnea, PND, edema.  She is limited in mobility due to chronic LBP and multiple prior surgeries.    Labs (4/14)   TSH 1.959 Labs (6/14):  K 3.7, Cr 0.49, Hgb 13.8   Wt Readings from Last 3 Encounters:  05/04/13 173 lb 12.8 oz (78.835 kg)  04/12/13 169 lb 12.1 oz (77 kg)  03/20/13 173 lb (78.472 kg)     Past Medical History  Diagnosis Date  . Asthma     extrinsic; moderate, persistant, nml spirometry 2010, nml CXR 1/08  . Pulmonary embolism     related to back surgery with prior coumadin use, now off  . DVT (deep venous thrombosis)   . PAF  (paroxysmal atrial fibrillation)     not on coumadin therapy  . HLD (hyperlipidemia)   . CAD (coronary artery disease)     50% mid LAD, 80% ostial D1 and moderate 80% mid circ by vath 2003  . Antiphospholipid syndrome     with hypercoagulable state  . Coronary heart disease   . Drug allergy     heparin/lovenox  . Erosive gastritis 1994  . Anemia, iron deficiency   . HTN (hypertension)     [name] at [facility], manages pt.   . Complication of anesthesia     cardiac arrest- in OR, at age 23 (8)y.o. during Scalenotomy   . Anxiety   . Hypothyroidism   . Bladder troubles     REPORTS INFECTIONS ON OCCASION DUE TO URETHRA MEATUS STRICTURE AT BIRTH   . H/O hiatal hernia   . Spondylosis, lumbosacral     ARTHRITIS- OA   . Cancer     basal cell removed fr. L arm   . Blood dyscrasia   . Liver spot     cyst- - no biopsy, but told that its benign   . GERD (gastroesophageal reflux disease)     Current Outpatient Prescriptions  Medication Sig Dispense Refill  . albuterol (PROVENTIL) (2.5 MG/3ML) 0.083% nebulizer solution Take 2.5 mg by nebulization every  6 (six) hours as needed. For shortness of breath/wheezing      . aspirin 81 MG EC tablet Take 81 mg by mouth daily.       Marland Kitchen atorvastatin (LIPITOR) 20 MG tablet Take 1 tablet (20 mg total) by mouth daily.  30 tablet  5  . azelastine (ASTELIN) 137 MCG/SPRAY nasal spray Place 1 spray into the nose 2 (two) times daily as needed. Use in each nostril as directed For allergies      . beclomethasone (QVAR) 80 MCG/ACT inhaler Inhale 2 puffs into the lungs 3 (three) times daily.      . Calcium Carbonate-Vitamin D 600-400 MG-UNIT per tablet Take 1 tablet by mouth 2 (two) times daily.        . cyclobenzaprine (FLEXERIL) 10 MG tablet Take 10 mg by mouth 3 (three) times daily as needed. For severe muscle pain      . diazepam (VALIUM) 5 MG tablet Take 5 mg by mouth as needed.       . docusate sodium (COLACE) 100 MG capsule Take 200 mg by mouth 2 (two)  times daily.       Marland Kitchen EPINEPHrine (EPIPEN) 0.3 mg/0.3 mL DEVI Inject 0.3 mLs (0.3 mg total) into the muscle once.  1 Device  2  . esomeprazole (NEXIUM) 40 MG capsule Take 40 mg by mouth 2 (two) times daily.       . fentaNYL (DURAGESIC - DOSED MCG/HR) 100 MCG/HR Place 1 patch onto the skin every other day.       . ferrous sulfate (FEOSOL) 325 (65 FE) MG tablet Take 325 mg by mouth every other day.       . gabapentin (NEURONTIN) 600 MG tablet Take 900 mg by mouth at bedtime.       . Glucosamine-Chondroitin 750-600 MG TABS Take 2 tablets by mouth 2 (two) times daily.       Marland Kitchen HYDROmorphone (DILAUDID) 4 MG tablet Take 4-8 mg by mouth every 4 (four) hours as needed. For pain      . levothyroxine (SYNTHROID, LEVOTHROID) 75 MCG tablet Take 75 mcg by mouth daily before breakfast.       . lidocaine (LIDODERM) 5 % Place 1 patch onto the skin as needed. Remove & Discard patch within 12 hours or as directed by MD      . Loratadine-Pseudoephedrine (CLARITIN-D 12 HOUR PO) Take 1 tablet by mouth 2 (two) times daily.       . metaxalone (SKELAXIN) 800 MG tablet Take 800 mg by mouth every 8 (eight) hours as needed. For muscle pain      . metoprolol (LOPRESSOR) 50 MG tablet Take 50 mg by mouth 2 (two) times daily.      . metoprolol (LOPRESSOR) 50 MG tablet TAKE ONE TABLET BY MOUTH TWICE A DAY  60 tablet  10  . montelukast (SINGULAIR) 10 MG tablet Take 10 mg by mouth at bedtime.       . Multiple Vitamin (MULTIVITAMIN WITH MINERALS) TABS Take 1 tablet by mouth daily.      . nitroGLYCERIN (NITROSTAT) 0.4 MG SL tablet Place 0.4 mg under the tongue every 5 (five) minutes as needed. For chest pain      . pirbuterol (MAXAIR) 200 MCG/INH inhaler Inhale 2 puffs into the lungs 4 (four) times daily.      Marland Kitchen triamcinolone (NASACORT) 55 MCG/ACT nasal inhaler Place 2 sprays into the nose 2 (two) times daily.        No current facility-administered  medications for this visit.    Allergies:    Allergies  Allergen Reactions  .  Hornet Venom Anaphylaxis  . Amoxicillin-Pot Clavulanate Diarrhea    "patient stated that she can take this medication"  . Enoxaparin Sodium     REACTION: thrombocytopenia  . Heparin     REACTION: thrombocytopenia  . Lactose Intolerance (Gi)   . Other     Sensitive to dye in Betadine & Chlorohexadine   . Pork-Derived Products   . Povidone     Sensitivity- but if its wiped off she is able to tolerate betadine   . Indomethacin Rash    Social History:  The patient  reports that she quit smoking about 40 years ago. She has never used smokeless tobacco. She reports that she does not drink alcohol or use illicit drugs.   ROS:  Please see the history of present illness.      All other systems reviewed and negative.   PHYSICAL EXAM: VS:  BP 142/88  Pulse 75  Ht 5\' 2"  (1.575 m)  Wt 173 lb 12.8 oz (78.835 kg)  BMI 31.78 kg/m2 Well nourished, well developed, in no acute distress HEENT: normal Neck: no JVD at 90 Cardiac:  normal S1, S2; RRR; no murmur Lungs:  clear to auscultation bilaterally, no wheezing, rhonchi or rales Abd: soft, nontender, no hepatomegaly Ext: no edema Skin: warm and dry Neuro:  CNs 2-12 intact, no focal abnormalities noted  EKG:  NSR, HR 75, normal axis, and nonspecific ST-T wave changes, no change from prior tracing     ASSESSMENT AND PLAN:  1. Chest Pain: Symptoms are suggestive of angina.  However, this occurred in the setting of apparent anaphylaxis from a hornet sting. She has had no recurrence. She had a low risk Myoview with the last 12 months. ECG is unchanged. Cardiac markers were unremarkable. Echocardiogram demonstrated normal LV function. At this point, in the context of anaphylaxis, I do not believe that she needs further workup for her chest pain. She will continue current medical therapy. If she has recurrent symptoms, we can consider repeating her stress perfusion study. 2. CAD: Continue aspirin and statin. 3. Hypertension: Borderline control.  Continue current therapy. 4. Hyperlipidemia: Continue statin. 5. Atrial Fibrillation:  Maintaining NSR. She is not on anticoagulation for various reasons as noted above. There has been some discussion of whether or not she should take Xarelto. 6. Hypercoagulable State: Follow up with hematology as planned. 7. Carotid Stenosis: Repeat Dopplers due to in 11/2013. 8. Disposition: Follow up with Dr. Verne Carrow in 3 mos.   Signed, Tereso Newcomer, PA-C  05/04/2013 2:51 PM

## 2013-05-04 NOTE — Patient Instructions (Addendum)
Your physician wants you to follow-up in: 3 months with Dr.McAlhany You will receive a reminder letter in the mail two months in advance. If you don't receive a letter, please call our office to schedule the follow-up appointment.

## 2013-05-08 DIAGNOSIS — Z981 Arthrodesis status: Secondary | ICD-10-CM | POA: Diagnosis not present

## 2013-05-08 DIAGNOSIS — M4120 Other idiopathic scoliosis, site unspecified: Secondary | ICD-10-CM | POA: Diagnosis not present

## 2013-05-08 DIAGNOSIS — M4310 Spondylolisthesis, site unspecified: Secondary | ICD-10-CM | POA: Diagnosis not present

## 2013-05-13 DIAGNOSIS — L570 Actinic keratosis: Secondary | ICD-10-CM | POA: Diagnosis not present

## 2013-05-13 DIAGNOSIS — L723 Sebaceous cyst: Secondary | ICD-10-CM | POA: Diagnosis not present

## 2013-05-13 DIAGNOSIS — D235 Other benign neoplasm of skin of trunk: Secondary | ICD-10-CM | POA: Diagnosis not present

## 2013-05-13 DIAGNOSIS — D485 Neoplasm of uncertain behavior of skin: Secondary | ICD-10-CM | POA: Diagnosis not present

## 2013-05-13 DIAGNOSIS — L819 Disorder of pigmentation, unspecified: Secondary | ICD-10-CM | POA: Diagnosis not present

## 2013-05-13 DIAGNOSIS — C4441 Basal cell carcinoma of skin of scalp and neck: Secondary | ICD-10-CM | POA: Diagnosis not present

## 2013-05-26 DIAGNOSIS — M47817 Spondylosis without myelopathy or radiculopathy, lumbosacral region: Secondary | ICD-10-CM | POA: Diagnosis not present

## 2013-05-26 DIAGNOSIS — M961 Postlaminectomy syndrome, not elsewhere classified: Secondary | ICD-10-CM | POA: Diagnosis not present

## 2013-05-26 DIAGNOSIS — G894 Chronic pain syndrome: Secondary | ICD-10-CM | POA: Diagnosis not present

## 2013-05-26 DIAGNOSIS — Z79899 Other long term (current) drug therapy: Secondary | ICD-10-CM | POA: Diagnosis not present

## 2013-06-16 ENCOUNTER — Encounter: Payer: Self-pay | Admitting: *Deleted

## 2013-06-16 ENCOUNTER — Telehealth: Payer: Self-pay | Admitting: Cardiovascular Disease

## 2013-06-16 NOTE — Telephone Encounter (Signed)
New Prob      Pt is needing a new activation code, hers has expired.

## 2013-06-16 NOTE — Telephone Encounter (Signed)
Spoke with pt's husband and will mail letter to pt with new code and instructions.

## 2013-06-17 DIAGNOSIS — C4441 Basal cell carcinoma of skin of scalp and neck: Secondary | ICD-10-CM | POA: Diagnosis not present

## 2013-06-26 DIAGNOSIS — G894 Chronic pain syndrome: Secondary | ICD-10-CM | POA: Diagnosis not present

## 2013-06-26 DIAGNOSIS — M47817 Spondylosis without myelopathy or radiculopathy, lumbosacral region: Secondary | ICD-10-CM | POA: Diagnosis not present

## 2013-06-26 DIAGNOSIS — M961 Postlaminectomy syndrome, not elsewhere classified: Secondary | ICD-10-CM | POA: Diagnosis not present

## 2013-07-23 DIAGNOSIS — E785 Hyperlipidemia, unspecified: Secondary | ICD-10-CM | POA: Diagnosis not present

## 2013-07-23 DIAGNOSIS — R7301 Impaired fasting glucose: Secondary | ICD-10-CM | POA: Diagnosis not present

## 2013-07-24 DIAGNOSIS — M47817 Spondylosis without myelopathy or radiculopathy, lumbosacral region: Secondary | ICD-10-CM | POA: Diagnosis not present

## 2013-07-24 DIAGNOSIS — M961 Postlaminectomy syndrome, not elsewhere classified: Secondary | ICD-10-CM | POA: Diagnosis not present

## 2013-07-24 DIAGNOSIS — Z79899 Other long term (current) drug therapy: Secondary | ICD-10-CM | POA: Diagnosis not present

## 2013-07-29 DIAGNOSIS — Z981 Arthrodesis status: Secondary | ICD-10-CM | POA: Diagnosis not present

## 2013-07-29 DIAGNOSIS — M545 Low back pain: Secondary | ICD-10-CM | POA: Diagnosis not present

## 2013-07-29 DIAGNOSIS — M4310 Spondylolisthesis, site unspecified: Secondary | ICD-10-CM | POA: Diagnosis not present

## 2013-07-29 DIAGNOSIS — M4120 Other idiopathic scoliosis, site unspecified: Secondary | ICD-10-CM | POA: Diagnosis not present

## 2013-08-01 ENCOUNTER — Ambulatory Visit: Payer: Medicare Other

## 2013-08-01 ENCOUNTER — Ambulatory Visit (INDEPENDENT_AMBULATORY_CARE_PROVIDER_SITE_OTHER): Payer: Medicare Other | Admitting: Family Medicine

## 2013-08-01 VITALS — BP 122/78 | HR 92 | Temp 98.5°F | Resp 20 | Ht 61.0 in | Wt 171.0 lb

## 2013-08-01 DIAGNOSIS — J45909 Unspecified asthma, uncomplicated: Secondary | ICD-10-CM

## 2013-08-01 DIAGNOSIS — J189 Pneumonia, unspecified organism: Secondary | ICD-10-CM | POA: Diagnosis not present

## 2013-08-01 DIAGNOSIS — J019 Acute sinusitis, unspecified: Secondary | ICD-10-CM

## 2013-08-01 LAB — POCT CBC
HCT, POC: 41 % (ref 37.7–47.9)
Hemoglobin: 12.9 g/dL (ref 12.2–16.2)
Lymph, poc: 2.3 (ref 0.6–3.4)
MCHC: 31.5 g/dL — AB (ref 31.8–35.4)
MCV: 90.5 fL (ref 80–97)
POC Granulocyte: 18.8 — AB (ref 2–6.9)
WBC: 22 10*3/uL — AB (ref 4.6–10.2)

## 2013-08-01 MED ORDER — MOXIFLOXACIN HCL 400 MG PO TABS: 400.0000 mg | ORAL_TABLET | Freq: Every day | ORAL | Status: DC

## 2013-08-01 MED ORDER — CEFTRIAXONE SODIUM 1 G IJ SOLR
1.0000 g | INTRAMUSCULAR | Status: DC
  Administered 2013-08-01: 1 g via INTRAMUSCULAR

## 2013-08-01 NOTE — Patient Instructions (Addendum)
If you are getting worse, come back immediately.  We will want to recheck you here in 2d at the latest to recheck your blood counts and your exam. Come back sooner if you are getting worse. Start your avelox (a powerful antibiotic) tomorrow morning. Pneumonia, Adult Pneumonia is an infection of the lungs.  CAUSES Pneumonia may be caused by bacteria or a virus. Usually, these infections are caused by breathing infectious particles into the lungs (respiratory tract). SYMPTOMS   Cough.  Fever.  Chest pain.  Increased rate of breathing.  Wheezing.  Mucus production. DIAGNOSIS  If you have the common symptoms of pneumonia, your caregiver will typically confirm the diagnosis with a chest X-ray. The X-ray will show an abnormality in the lung (pulmonary infiltrate) if you have pneumonia. Other tests of your blood, urine, or sputum may be done to find the specific cause of your pneumonia. Your caregiver may also do tests (blood gases or pulse oximetry) to see how well your lungs are working. TREATMENT  Some forms of pneumonia may be spread to other people when you cough or sneeze. You may be asked to wear a mask before and during your exam. Pneumonia that is caused by bacteria is treated with antibiotic medicine. Pneumonia that is caused by the influenza virus may be treated with an antiviral medicine. Most other viral infections must run their course. These infections will not respond to antibiotics.  PREVENTION A pneumococcal shot (vaccine) is available to prevent a common bacterial cause of pneumonia. This is usually suggested for:  People over 50 years old.  Patients on chemotherapy.  People with chronic lung problems, such as bronchitis or emphysema.  People with immune system problems. If you are over 65 or have a high risk condition, you may receive the pneumococcal vaccine if you have not received it before. In some countries, a routine influenza vaccine is also recommended. This  vaccine can help prevent some cases of pneumonia.You may be offered the influenza vaccine as part of your care. If you smoke, it is time to quit. You may receive instructions on how to stop smoking. Your caregiver can provide medicines and counseling to help you quit. HOME CARE INSTRUCTIONS   Cough suppressants may be used if you are losing too much rest. However, coughing protects you by clearing your lungs. You should avoid using cough suppressants if you can.  Your caregiver may have prescribed medicine if he or she thinks your pneumonia is caused by a bacteria or influenza. Finish your medicine even if you start to feel better.  Your caregiver may also prescribe an expectorant. This loosens the mucus to be coughed up.  Only take over-the-counter or prescription medicines for pain, discomfort, or fever as directed by your caregiver.  Do not smoke. Smoking is a common cause of bronchitis and can contribute to pneumonia. If you are a smoker and continue to smoke, your cough may last several weeks after your pneumonia has cleared.  A cold steam vaporizer or humidifier in your room or home may help loosen mucus.  Coughing is often worse at night. Sleeping in a semi-upright position in a recliner or using a couple pillows under your head will help with this.  Get rest as you feel it is needed. Your body will usually let you know when you need to rest. SEEK IMMEDIATE MEDICAL CARE IF:   Your illness becomes worse. This is especially true if you are elderly or weakened from any other disease.  You cannot  control your cough with suppressants and are losing sleep.  You begin coughing up blood.  You develop pain which is getting worse or is uncontrolled with medicines.  You have a fever.  Any of the symptoms which initially brought you in for treatment are getting worse rather than better.  You develop shortness of breath or chest pain. MAKE SURE YOU:   Understand these  instructions.  Will watch your condition.  Will get help right away if you are not doing well or get worse. Document Released: 10/08/2005 Document Revised: 12/31/2011 Document Reviewed: 12/28/2010 [facility] Patient Information 2014 [place], [place].

## 2013-08-01 NOTE — Progress Notes (Signed)
This chart was scribed for Latoya Sorenson, MD, by [name], ED Scribe. The patient's care was started at 5:52 PM  Subjective:    Patient ID: Latoya [name], female    DOB: Aug 05, 1946, 67 y.o.   MRN: [id number]  Chief Complaint  Patient presents with  . Sinusitis  . Cough    congestion    HPI  Latoya [name] is a 67 y.o. Female, with a h/o asthma and allergies, who presents to [facility] complaining of sinusitis which has been persisting for four days. The pt has experienced a cough, SOB, congestion, post-nasal drip, fever, chills, myalgia, a decreased appetite, increased fatigue, and dizziness as associated symptoms. The pt describes the post-nasal drip which she brought up as bloody and purulent. The pt has also had fever-associated confusion; for example, she asked her husband to put the peanut butter in her pants rather than her shirt in her pants.   The pt had one albuterol treatment yesterday. She was told by her doctor to only use it sparingly since she has A-fib. She states the A-fib is intermittent, and that it is increased with dehydration and infection., specifically bronchitis.  The pt has been using her ICS inhaler as prescribed as well. She also takes Claritin D as prescribed and is using nasocort and antihistamine nasal sprays.   She denies any nausea, diarrhea, constipation, or dysuria.   The pt has a h/o pneumonia earlier this year - April - which started several weeks after a sinus infection.  It was found on a chest CT which was done to r/o a repeat PE due to her h/o antiphospholipid ab syndrome.  She has used Augmentin with prior bacterial infections. It is on her allergy list but tolerates it fine as long as she eats yogurt with it.  She has had 13 spinal surgeries. Dr. [name] is her neurosurgeon. He has informed the pt to not take any steroids for a year from her last surgery. With a prior episode of SOB, Dr. [name] attributed it to the clam brace as a possible cause.   A previous  chest x-ray, performed four months ago, revealed that her RLL lung appears to have chronic atelectasis vs scarring.   Past Medical History  Diagnosis Date  . Asthma     extrinsic; moderate, persistant, nml spirometry 2010, nml CXR 1/08  . Pulmonary embolism     related to back surgery with prior coumadin use, now off  . DVT (deep venous thrombosis)   . PAF (paroxysmal atrial fibrillation)     not on coumadin therapy  . HLD (hyperlipidemia)   . CAD (coronary artery disease)     50% mid LAD, 80% ostial D1 and moderate 80% mid circ by vath 2003  . Antiphospholipid syndrome     with hypercoagulable state  . Coronary heart disease   . Drug allergy     heparin/lovenox  . Erosive gastritis 1994  . Anemia, iron deficiency   . HTN (hypertension)     [name] at [facility], manages pt.   . Complication of anesthesia     cardiac arrest- in OR, at age 4 (52)y.o. during Scalenotomy   . Anxiety   . Hypothyroidism   . Bladder troubles     REPORTS INFECTIONS ON OCCASION DUE TO URETHRA MEATUS STRICTURE AT BIRTH   . H/O hiatal hernia   . Spondylosis, lumbosacral     ARTHRITIS- OA   . Cancer     basal cell removed fr. L arm   .  Blood dyscrasia   . Liver spot     cyst- - no biopsy, but told that its benign   . GERD (gastroesophageal reflux disease)    Current Outpatient Prescriptions on File Prior to Visit  Medication Sig Dispense Refill  . albuterol (PROVENTIL) (2.5 MG/3ML) 0.083% nebulizer solution Take 2.5 mg by nebulization every 6 (six) hours as needed. For shortness of breath/wheezing      . aspirin 81 MG EC tablet Take 81 mg by mouth daily.       Marland Kitchen atorvastatin (LIPITOR) 20 MG tablet Take 1 tablet (20 mg total) by mouth daily.  30 tablet  5  . azelastine (ASTELIN) 137 MCG/SPRAY nasal spray Place 1 spray into the nose 2 (two) times daily as needed. Use in each nostril as directed For allergies      . beclomethasone (QVAR) 80 MCG/ACT inhaler Inhale 2 puffs into the lungs 3 (three)  times daily.      . Calcium Carbonate-Vitamin D 600-400 MG-UNIT per tablet Take 1 tablet by mouth 2 (two) times daily.        . cyclobenzaprine (FLEXERIL) 10 MG tablet Take 10 mg by mouth 3 (three) times daily as needed. For severe muscle pain      . diazepam (VALIUM) 5 MG tablet Take 5 mg by mouth as needed.       . docusate sodium (COLACE) 100 MG capsule Take 200 mg by mouth 2 (two) times daily.       Marland Kitchen EPINEPHrine (EPIPEN) 0.3 mg/0.3 mL DEVI Inject 0.3 mLs (0.3 mg total) into the muscle once.  1 Device  2  . esomeprazole (NEXIUM) 40 MG capsule Take 40 mg by mouth 2 (two) times daily.       . fentaNYL (DURAGESIC - DOSED MCG/HR) 100 MCG/HR Place 1 patch onto the skin every other day.       . gabapentin (NEURONTIN) 600 MG tablet Take 900 mg by mouth at bedtime.       . Glucosamine-Chondroitin 750-600 MG TABS Take 2 tablets by mouth 2 (two) times daily.       Marland Kitchen HYDROmorphone (DILAUDID) 4 MG tablet Take 4-8 mg by mouth every 4 (four) hours as needed. For pain      . levothyroxine (SYNTHROID, LEVOTHROID) 75 MCG tablet Take 75 mcg by mouth daily before breakfast.       . lidocaine (LIDODERM) 5 % Place 1 patch onto the skin as needed. Remove & Discard patch within 12 hours or as directed by MD      . Loratadine-Pseudoephedrine (CLARITIN-D 12 HOUR PO) Take 1 tablet by mouth 2 (two) times daily.       . metoprolol (LOPRESSOR) 50 MG tablet Take 50 mg by mouth 2 (two) times daily.      . metoprolol (LOPRESSOR) 50 MG tablet TAKE ONE TABLET BY MOUTH TWICE A DAY  60 tablet  10  . montelukast (SINGULAIR) 10 MG tablet Take 10 mg by mouth at bedtime.       . Multiple Vitamin (MULTIVITAMIN WITH MINERALS) TABS Take 1 tablet by mouth daily.      . nitroGLYCERIN (NITROSTAT) 0.4 MG SL tablet Place 0.4 mg under the tongue every 5 (five) minutes as needed. For chest pain      . triamcinolone (NASACORT) 55 MCG/ACT nasal inhaler Place 2 sprays into the nose 2 (two) times daily.       . ferrous sulfate (FEOSOL) 325 (65  FE) MG tablet Take 325 mg by mouth  every other day.       . metaxalone (SKELAXIN) 800 MG tablet Take 800 mg by mouth every 8 (eight) hours as needed. For muscle pain      . pirbuterol (MAXAIR) 200 MCG/INH inhaler Inhale 2 puffs into the lungs 4 (four) times daily.       No current facility-administered medications on file prior to visit.    Allergies  Allergen Reactions  . Hornet Venom Anaphylaxis  . Amoxicillin-Pot Clavulanate Diarrhea    "patient stated that she can take this medication"  . Enoxaparin Sodium     REACTION: thrombocytopenia  . Heparin     REACTION: thrombocytopenia  . Lactose Intolerance (Gi)   . Other     Sensitive to dye in Betadine & Chlorohexadine   . Pork-Derived Products   . Povidone     Sensitivity- but if its wiped off she is able to tolerate betadine   . Indomethacin Rash    Vital Signs: BP 122/78  Pulse 92  Temp(Src) 98.5 F (36.9 C) (Oral)  Resp 20  Ht 5\' 1"  (1.549 m)  Wt 171 lb (77.565 kg)  BMI 32.33 kg/m2  SpO2 95%    Review of Systems  Constitutional: Positive for fever, chills, appetite change (Decreased) and fatigue.  HENT: Positive for congestion, postnasal drip and rhinorrhea.   Respiratory: Positive for cough and shortness of breath.   Gastrointestinal: Positive for vomiting (Possible due to GERD). Negative for nausea, abdominal pain, diarrhea and constipation.  Genitourinary: Negative for dysuria.  Musculoskeletal: Positive for myalgias.  Neurological: Positive for dizziness.  Psychiatric/Behavioral: Positive for confusion (Fever-associated).       Objective:   Physical Exam  Nursing note and vitals reviewed. Constitutional: She is oriented to person, place, and time. She appears well-developed and well-nourished. No distress.  HENT:  Head: Normocephalic and atraumatic.  Right Ear: Tympanic membrane is not injected and not retracted.  Mouth/Throat: Mucous membranes are dry. Posterior oropharyngeal erythema present.   Increased mucosal edema.  Perforated septum.   Eyes: EOM are normal.  Neck: Neck supple. No tracheal deviation present. No thyromegaly present.  Cardiovascular: Normal rate, regular rhythm and normal heart sounds.   No murmur heard. Pulmonary/Chest: Effort normal. No respiratory distress. She has no wheezes. She has rales.  Right lower lobe rales.   Musculoskeletal: Normal range of motion.  Lymphadenopathy:       Head (right side): No submandibular, no preauricular and no posterior auricular adenopathy present.       Head (left side): No submandibular, no preauricular and no posterior auricular adenopathy present.    She has no cervical adenopathy.       Right: No supraclavicular adenopathy present.       Left: No supraclavicular adenopathy present.  Neurological: She is alert and oriented to person, place, and time.  Skin: Skin is warm and dry.  Psychiatric: She has a normal mood and affect. Her behavior is normal.  On recheck O2 sat remains at 96%    Results for orders placed in visit on 08/01/13  POCT CBC      Result Value Range   WBC 22.0 (*) 4.6 - 10.2 K/uL   Lymph, poc 2.3  0.6 - 3.4   POC LYMPH PERCENT 10.5  10 - 50 %L   MID (cbc) 0.9  0 - 0.9   POC MID % 3.9  0 - 12 %M   POC Granulocyte 18.8 (*) 2 - 6.9   Granulocyte percent 85.6 (*) 37 -  80 %G   RBC 4.53  4.04 - 5.48 M/uL   Hemoglobin 12.9  12.2 - 16.2 g/dL   HCT, POC 16.1  09.6 - 47.9 %   MCV 90.5  80 - 97 fL   MCH, POC 28.5  27 - 31.2 pg   MCHC 31.5 (*) 31.8 - 35.4 g/dL   RDW, POC 04.5     Platelet Count, POC 290  142 - 424 K/uL   MPV 7.6  0 - 99.8 fL   Primary x-ray read by Dr. Clelia Croft. Chest x-ray right lower lobe infiltrate concerning for pneumonia.     Assessment & Plan:  Extrinsic asthma, unspecified - Plan: DG Chest 2 View  Sinusitis, acute - Plan: POCT CBC  CAP (community acquired pneumonia) - Plan: cefTRIAXone (ROCEPHIN) injection 1 g IM x 1 now - start avelox. Recheck in 48 hrs, sooner if worsening.    Meds ordered this encounter  Medications  . cefTRIAXone (ROCEPHIN) injection 1 g    Sig:   . moxifloxacin (AVELOX) 400 MG tablet    Sig: Take 1 tablet (400 mg total) by mouth daily.    Dispense:  10 tablet    Refill:  0

## 2013-08-03 ENCOUNTER — Telehealth: Payer: Self-pay

## 2013-08-03 ENCOUNTER — Ambulatory Visit (INDEPENDENT_AMBULATORY_CARE_PROVIDER_SITE_OTHER): Payer: Medicare Other | Admitting: Family Medicine

## 2013-08-03 ENCOUNTER — Other Ambulatory Visit: Payer: Self-pay | Admitting: Cardiovascular Disease

## 2013-08-03 VITALS — BP 124/78 | HR 77 | Temp 98.1°F | Resp 16 | Ht 61.0 in | Wt 171.0 lb

## 2013-08-03 DIAGNOSIS — J189 Pneumonia, unspecified organism: Secondary | ICD-10-CM | POA: Diagnosis not present

## 2013-08-03 LAB — POCT CBC
MCH, POC: 28.6 pg (ref 27–31.2)
MCHC: 31.7 g/dL — AB (ref 31.8–35.4)
MID (cbc): 0.5 (ref 0–0.9)
MPV: 7.7 fL (ref 0–99.8)
POC Granulocyte: 5.2 (ref 2–6.9)
POC MID %: 6.3 %M (ref 0–12)
Platelet Count, POC: 350 10*3/uL (ref 142–424)
RBC: 4.83 M/uL (ref 4.04–5.48)
WBC: 8.5 10*3/uL (ref 4.6–10.2)

## 2013-08-03 MED ORDER — FIRST-DUKES MOUTHWASH MT SUSP: 5.0000 mL | OROMUCOSAL | Status: DC | PRN

## 2013-08-03 NOTE — Patient Instructions (Signed)
You absolutely need to have a chest xray repeated in 1 month to ensure your lungs have cleared up.  You have now gotten pneumonia twice in the same area so we need to make sure that there is no mass or obstruction underlying causing this.

## 2013-08-03 NOTE — Telephone Encounter (Signed)
Pt saw Dr.Shaw Saturday. Pt's husband says she is improving and does not have as much congestion and is tolerating the antibiotic with yogurt. However, did not feel well enough to come back in today. Will try to come in Wednesday.

## 2013-08-03 NOTE — Progress Notes (Addendum)
Subjective:  This chart was scribed for [name], MD by Quintella Reichert, ED scribe.  This patient was seen in room [facility] Room 4 and the patient's care was started at 4:53 PM.   Patient ID: Latoya [name], female    DOB: 05/16/1946, 67 y.o.   MRN: [id number]  Chief Complaint  Patient presents with  . Follow-up    pt was seen 08/01/2013 here for pneumonia    HPI  HPI Comments: Latoya [name] is a 67 y.o. female for a recheck for pneumonia.  Pt was seen here on 08/01/13 and diagnosed with pneumonia and placed on Meclizine.  She states her breathing has improved significantly since then and she is no longer coughing up sputum though she feels like she has something in her chest that wants to break loose but she can't get it out.  She states she occasionally feels hot but "my thermostat's broken."  She is still having some dizziness which she took meclizine for this morning - a usual side effect of illness for her.  She denies measured fever, chills, or hallucinations.  She has been eating and drinking normally.  Wants to restart exercise.   Past Medical History  Diagnosis Date  . Asthma     extrinsic; moderate, persistant, nml spirometry 2010, nml CXR 1/08  . Pulmonary embolism     related to back surgery with prior coumadin use, now off  . DVT (deep venous thrombosis)   . PAF (paroxysmal atrial fibrillation)     not on coumadin therapy  . HLD (hyperlipidemia)   . CAD (coronary artery disease)     50% mid LAD, 80% ostial D1 and moderate 80% mid circ by vath 2003  . Antiphospholipid syndrome     with hypercoagulable state  . Coronary heart disease   . Drug allergy     heparin/lovenox  . Erosive gastritis 1994  . Anemia, iron deficiency   . HTN (hypertension)     [name] at [facility], manages pt.   . Complication of anesthesia     cardiac arrest- in OR, at age 89 (78)y.o. during Scalenotomy   . Anxiety   . Hypothyroidism   . Bladder troubles     REPORTS INFECTIONS ON OCCASION  DUE TO URETHRA MEATUS STRICTURE AT BIRTH   . H/O hiatal hernia   . Spondylosis, lumbosacral     ARTHRITIS- OA   . Cancer     basal cell removed fr. L arm   . Blood dyscrasia   . Liver spot     cyst- - no biopsy, but told that its benign   . GERD (gastroesophageal reflux disease)       Medication List       This list is accurate as of: 08/03/13  4:53 PM.  Always use your most recent med list.               albuterol (2.5 MG/3ML) 0.083% nebulizer solution  Commonly known as:  PROVENTIL  Take 2.5 mg by nebulization every 6 (six) hours as needed. For shortness of breath/wheezing     aspirin 81 MG EC tablet  Take 81 mg by mouth daily.     ASTELIN 137 MCG/SPRAY nasal spray  Generic drug:  azelastine  - Place 1 spray into the nose 2 (two) times daily as needed. Use in each nostril as directed  - For allergies     atorvastatin 20 MG tablet  Commonly known as:  LIPITOR  TAKE 1 TABLET (  20 MG TOTAL) BY MOUTH DAILY.     beclomethasone 80 MCG/ACT inhaler  Commonly known as:  QVAR  Inhale 2 puffs into the lungs 3 (three) times daily.     Calcium Carbonate-Vitamin D 600-400 MG-UNIT per tablet  Take 1 tablet by mouth 2 (two) times daily.     CLARITIN-D 12 HOUR PO  Take 1 tablet by mouth 2 (two) times daily.     cyclobenzaprine 10 MG tablet  Commonly known as:  FLEXERIL  Take 10 mg by mouth 3 (three) times daily as needed. For severe muscle pain     diazepam 5 MG tablet  Commonly known as:  VALIUM  Take 5 mg by mouth as needed.     docusate sodium 100 MG capsule  Commonly known as:  COLACE  Take 200 mg by mouth 2 (two) times daily.     EPINEPHrine 0.3 mg/0.3 mL Soaj injection  Commonly known as:  EPIPEN  Inject 0.3 mLs (0.3 mg total) into the muscle once.     esomeprazole 40 MG capsule  Commonly known as:  NEXIUM  Take 40 mg by mouth 2 (two) times daily.     fentaNYL 100 MCG/HR  Commonly known as:  DURAGESIC - dosed mcg/hr  Place 1 patch onto the skin every other  day.     FEOSOL 325 (65 FE) MG tablet  Generic drug:  ferrous sulfate  Take 325 mg by mouth every other day.     Glucosamine-Chondroitin 750-600 MG Tabs  Take 2 tablets by mouth 2 (two) times daily.     HYDROmorphone 4 MG tablet  Commonly known as:  DILAUDID  Take 4-8 mg by mouth every 4 (four) hours as needed. For pain     levothyroxine 75 MCG tablet  Commonly known as:  SYNTHROID, LEVOTHROID  Take 75 mcg by mouth daily before breakfast.     lidocaine 5 %  Commonly known as:  LIDODERM  Place 1 patch onto the skin as needed. Remove & Discard patch within 12 hours or as directed by MD     metaxalone 800 MG tablet  Commonly known as:  SKELAXIN  Take 800 mg by mouth every 8 (eight) hours as needed. For muscle pain     metoprolol 50 MG tablet  Commonly known as:  LOPRESSOR  Take 50 mg by mouth 2 (two) times daily.     metoprolol 50 MG tablet  Commonly known as:  LOPRESSOR  TAKE ONE TABLET BY MOUTH TWICE A DAY     montelukast 10 MG tablet  Commonly known as:  SINGULAIR  Take 10 mg by mouth at bedtime.     moxifloxacin 400 MG tablet  Commonly known as:  AVELOX  Take 1 tablet (400 mg total) by mouth daily.     multivitamin with minerals Tabs tablet  Take 1 tablet by mouth daily.     NEURONTIN 600 MG tablet  Generic drug:  gabapentin  Take 900 mg by mouth at bedtime.     nitroGLYCERIN 0.4 MG SL tablet  Commonly known as:  NITROSTAT  Place 0.4 mg under the tongue every 5 (five) minutes as needed. For chest pain     pirbuterol 200 MCG/INH inhaler  Commonly known as:  MAXAIR  Inhale 2 puffs into the lungs 4 (four) times daily.     triamcinolone 55 MCG/ACT nasal inhaler  Commonly known as:  NASACORT  Place 2 sprays into the nose 2 (two) times daily.  Allergies  Allergen Reactions  . Hornet Venom Anaphylaxis  . Amoxicillin-Pot Clavulanate Diarrhea    "patient stated that she can take this medication"  . Enoxaparin Sodium     REACTION: thrombocytopenia  .  Heparin     REACTION: thrombocytopenia  . Lactose Intolerance (Gi)   . Other     Sensitive to dye in Betadine & Chlorohexadine   . Pork-Derived Products   . Povidone     Sensitivity- but if its wiped off she is able to tolerate betadine   . Indomethacin Rash      Review of Systems  Constitutional: Negative for fever, chills, activity change and fatigue.  HENT: Positive for congestion, postnasal drip and rhinorrhea.   Respiratory: Positive for cough and shortness of breath (improved since last visit).   Cardiovascular: Negative for chest pain.  Musculoskeletal: Negative for myalgias.  Neurological: Positive for dizziness. Negative for syncope.  Psychiatric/Behavioral: Negative for hallucinations and confusion.        BP 124/78  Pulse 77  Temp(Src) 98.1 F (36.7 C)  Resp 16  Ht 5\' 1"  (1.549 m)  Wt 171 lb (77.565 kg)  BMI 32.33 kg/m2  SpO2 97% Objective:   Physical Exam  Nursing note and vitals reviewed. Constitutional: She is oriented to person, place, and time. She appears well-developed and well-nourished. No distress.  HENT:  Head: Normocephalic and atraumatic.  Eyes: EOM are normal.  Neck: Neck supple. No tracheal deviation present.  Cardiovascular: Normal rate, regular rhythm and normal heart sounds.   No murmur heard. Pulmonary/Chest: Effort normal. No respiratory distress. She has no wheezes. She has rales.  Right lower lobe rales, but improved from prior exam  Musculoskeletal: Normal range of motion.  Neurological: She is alert and oriented to person, place, and time.  Skin: Skin is warm and dry.  Psychiatric: She has a normal mood and affect. Her behavior is normal.   Results for orders placed in visit on 08/03/13  POCT CBC      Result Value Range   WBC 8.5  4.6 - 10.2 K/uL   Lymph, poc 2.8  0.6 - 3.4   POC LYMPH PERCENT 32.4  10 - 50 %L   MID (cbc) 0.5  0 - 0.9   POC MID % 6.3  0 - 12 %M   POC Granulocyte 5.2  2 - 6.9   Granulocyte percent 61.3  37 -  80 %G   RBC 4.83  4.04 - 5.48 M/uL   Hemoglobin 13.8  12.2 - 16.2 g/dL   HCT, POC 16.1  09.6 - 47.9 %   MCV 90.2  80 - 97 fL   MCH, POC 28.6  27 - 31.2 pg   MCHC 31.7 (*) 31.8 - 35.4 g/dL   RDW, POC 04.5     Platelet Count, POC 350  142 - 424 K/uL   MPV 7.7  0 - 99.8 fL      Assessment & Plan:  CAP (community acquired pneumonia) - Plan: POCT CBC Doing MUCH better.  Remarkable turnaround. WBC 22 -> 8.5 in <48hrs. Cont on Avelox to finish 10d course. Ok to try prn mucinex and steam treatments/hot tea. Pt wants to restart exercise - ok after antibiotic course is complete. RTC if worsening at all. Pt will f/u with PCP for f/u CXR to document clearance in 4 wks - understands importance of this.

## 2013-08-03 NOTE — Telephone Encounter (Signed)
Called and urged her to come in today she has a pnuemonia and states she will try to come in.

## 2013-08-04 NOTE — Telephone Encounter (Signed)
Pt seen in clinic today.  

## 2013-08-05 ENCOUNTER — Ambulatory Visit: Payer: Medicare Other | Admitting: Cardiovascular Disease

## 2013-08-06 ENCOUNTER — Ambulatory Visit (INDEPENDENT_AMBULATORY_CARE_PROVIDER_SITE_OTHER): Payer: Medicare Other | Admitting: Family Medicine

## 2013-08-06 ENCOUNTER — Ambulatory Visit: Payer: Medicare Other

## 2013-08-06 VITALS — BP 110/70 | HR 87 | Temp 98.0°F | Resp 20 | Ht 61.0 in | Wt 171.0 lb

## 2013-08-06 DIAGNOSIS — R0602 Shortness of breath: Secondary | ICD-10-CM

## 2013-08-06 DIAGNOSIS — J189 Pneumonia, unspecified organism: Secondary | ICD-10-CM

## 2013-08-06 DIAGNOSIS — K449 Diaphragmatic hernia without obstruction or gangrene: Secondary | ICD-10-CM

## 2013-08-06 DIAGNOSIS — D6861 Antiphospholipid syndrome: Secondary | ICD-10-CM

## 2013-08-06 LAB — POCT CBC
Granulocyte percent: 88.5 %G — AB (ref 37–80)
HCT, POC: 41.1 % (ref 37.7–47.9)
MCV: 89.8 fL (ref 80–97)
POC LYMPH PERCENT: 9 %L — AB (ref 10–50)
RBC: 4.58 M/uL (ref 4.04–5.48)

## 2013-08-06 MED ORDER — SUCRALFATE 1 G PO TABS: 1.0000 g | ORAL_TABLET | Freq: Four times a day (QID) | ORAL | Status: DC

## 2013-08-06 MED ORDER — CEFTRIAXONE SODIUM 1 G IJ SOLR
1.0000 g | Freq: Once | INTRAMUSCULAR | Status: AC
  Administered 2013-08-06: 1 g via INTRAMUSCULAR

## 2013-08-06 MED ORDER — AZITHROMYCIN 500 MG PO TABS: 500.0000 mg | ORAL_TABLET | Freq: Every day | ORAL | Status: DC

## 2013-08-07 NOTE — Progress Notes (Signed)
Subjective:    Patient ID: Latoya [name], female    DOB: [birth year], 67 y.o.   MRN: [id number]  Chief Complaint  Patient presents with  . Follow-up    pneumonia  . Cough    coughing up blood   Cough Associated symptoms include chills and a fever. Pertinent negatives include no chest pain, rhinorrhea, sore throat, shortness of breath or wheezing.   Latoya [name] is a 67 y.o. female w/ a PMHx sig for antiphospholipid abx syndrome who presents to [facility] requesting a recheck for pneumonia which was diagnosed 5d prev. Patient reports that she still feels "not great."  I last saw her 3d ago and she was doing much better but she decided to come back in today for recheck as she again developed f/c all day yesterday and is coughing more.  She reports taking her Avelox daily (on 5 of 10) and stayed well-hydrated. She denies chest pain, shortness of breath, wheeze, or heartburn sxs.    She states that she had a gastroscopy for internal bleeding due to NSAID use and had a hemoglobin level of 7. She states that she recently got her hemoglobin back up to 14. Patient reports multiple episodes of emesis with hematemesis for the past 2-3 months (including "dark, coagulated-looking blood") secondary to eating too much that is exacerbated when laying down. She has a lot of early satiety and regurg with occ hemoptysis which she attributes to a hiatal hernia.  She had a particularly severe episode yest so would like her hgb checked. She is taking protonix 40 bid and has some carafate at home which she used sev yrs ago and seemed to help.  Past Medical History  Diagnosis Date  . Asthma     extrinsic; moderate, persistant, nml spirometry 2010, nml CXR 1/08  . Pulmonary embolism     related to back surgery with prior coumadin use, now off  . DVT (deep venous thrombosis)   . PAF (paroxysmal atrial fibrillation)     not on coumadin therapy  . HLD (hyperlipidemia)   . CAD (coronary artery  disease)     50% mid LAD, 80% ostial D1 and moderate 80% mid circ by vath 2003  . Antiphospholipid syndrome     with hypercoagulable state  . Coronary heart disease   . Drug allergy     heparin/lovenox  . Erosive gastritis 1994  . Anemia, iron deficiency   . HTN (hypertension)     [name] at [facility], manages pt.   . Complication of anesthesia     cardiac arrest- in OR, at age 80 (21)y.o. during Scalenotomy   . Anxiety   . Hypothyroidism   . Bladder troubles     REPORTS INFECTIONS ON OCCASION DUE TO URETHRA MEATUS STRICTURE AT BIRTH   . H/O hiatal hernia   . Spondylosis, lumbosacral     ARTHRITIS- OA   . Cancer     basal cell removed fr. L arm   . Blood dyscrasia   . Liver spot     cyst- - no biopsy, but told that its benign   . GERD (gastroesophageal reflux disease)    Current Outpatient Prescriptions on File Prior to Visit  Medication Sig Dispense Refill  . albuterol (PROVENTIL) (2.5 MG/3ML) 0.083% nebulizer solution Take 2.5 mg by nebulization every 6 (six) hours as needed. For shortness of breath/wheezing      . aspirin 81 MG EC tablet Take 81 mg by mouth daily.       Marland Kitchen  atorvastatin (LIPITOR) 20 MG tablet TAKE 1 TABLET (20 MG TOTAL) BY MOUTH DAILY.  30 tablet  0  . azelastine (ASTELIN) 137 MCG/SPRAY nasal spray Place 1 spray into the nose 2 (two) times daily as needed. Use in each nostril as directed For allergies      . beclomethasone (QVAR) 80 MCG/ACT inhaler Inhale 2 puffs into the lungs 3 (three) times daily.      . Calcium Carbonate-Vitamin D 600-400 MG-UNIT per tablet Take 1 tablet by mouth 2 (two) times daily.        . cyclobenzaprine (FLEXERIL) 10 MG tablet Take 10 mg by mouth 3 (three) times daily as needed. For severe muscle pain      . diazepam (VALIUM) 5 MG tablet Take 5 mg by mouth as needed.       . Diphenhyd-Hydrocort-Nystatin (FIRST-DUKES MOUTHWASH) SUSP Use as directed 5 mLs in the mouth or throat every 2 (two) hours as needed (sore throat).  237 mL  0   . docusate sodium (COLACE) 100 MG capsule Take 200 mg by mouth 2 (two) times daily.       Marland Kitchen EPINEPHrine (EPIPEN) 0.3 mg/0.3 mL DEVI Inject 0.3 mLs (0.3 mg total) into the muscle once.  1 Device  2  . esomeprazole (NEXIUM) 40 MG capsule Take 40 mg by mouth 2 (two) times daily.       . fentaNYL (DURAGESIC - DOSED MCG/HR) 100 MCG/HR Place 1 patch onto the skin every other day.       . ferrous sulfate (FEOSOL) 325 (65 FE) MG tablet Take 325 mg by mouth every other day.       . gabapentin (NEURONTIN) 600 MG tablet Take 900 mg by mouth at bedtime.       . Glucosamine-Chondroitin 750-600 MG TABS Take 2 tablets by mouth 2 (two) times daily.       Marland Kitchen HYDROmorphone (DILAUDID) 4 MG tablet Take 4-8 mg by mouth every 4 (four) hours as needed. For pain      . levothyroxine (SYNTHROID, LEVOTHROID) 75 MCG tablet Take 75 mcg by mouth daily before breakfast.       . lidocaine (LIDODERM) 5 % Place 1 patch onto the skin as needed. Remove & Discard patch within 12 hours or as directed by MD      . Loratadine-Pseudoephedrine (CLARITIN-D 12 HOUR PO) Take 1 tablet by mouth 2 (two) times daily.       . metaxalone (SKELAXIN) 800 MG tablet Take 800 mg by mouth every 8 (eight) hours as needed. For muscle pain      . metoprolol (LOPRESSOR) 50 MG tablet Take 50 mg by mouth 2 (two) times daily.      . metoprolol (LOPRESSOR) 50 MG tablet TAKE ONE TABLET BY MOUTH TWICE A DAY  60 tablet  10  . montelukast (SINGULAIR) 10 MG tablet Take 10 mg by mouth at bedtime.       . moxifloxacin (AVELOX) 400 MG tablet Take 1 tablet (400 mg total) by mouth daily.  10 tablet  0  . Multiple Vitamin (MULTIVITAMIN WITH MINERALS) TABS Take 1 tablet by mouth daily.      . nitroGLYCERIN (NITROSTAT) 0.4 MG SL tablet Place 0.4 mg under the tongue every 5 (five) minutes as needed. For chest pain      . pirbuterol (MAXAIR) 200 MCG/INH inhaler Inhale 2 puffs into the lungs 4 (four) times daily.      Marland Kitchen triamcinolone (NASACORT) 55 MCG/ACT nasal inhaler Place  2  sprays into the nose 2 (two) times daily.        No current facility-administered medications on file prior to visit.   Allergies  Allergen Reactions  . Hornet Venom Anaphylaxis  . Amoxicillin-Pot Clavulanate Diarrhea    "patient stated that she can take this medication"  . Enoxaparin Sodium     REACTION: thrombocytopenia  . Heparin     REACTION: thrombocytopenia  . Lactose Intolerance (Gi)   . Other     Sensitive to dye in Betadine & Chlorohexadine   . Pork-Derived Products   . Povidone     Sensitivity- but if its wiped off she is able to tolerate betadine   . Indomethacin Rash   Review of Systems  Constitutional: Positive for fever, chills, diaphoresis, activity change, appetite change and fatigue. Negative for unexpected weight change.  HENT: Negative for congestion, nosebleeds, rhinorrhea, sinus pressure, sneezing, sore throat, trouble swallowing and voice change.   Respiratory: Positive for cough. Negative for shortness of breath and wheezing.   Cardiovascular: Negative for chest pain.  Gastrointestinal: Positive for vomiting ( with hematemesis) and abdominal distention. Negative for nausea, abdominal pain, blood in stool and anal bleeding.  Musculoskeletal: Positive for arthralgias and back pain.  Psychiatric/Behavioral: Negative for sleep disturbance.     BP 110/70  Pulse 87  Temp(Src) 98 F (36.7 C) (Oral)  Resp 20  Ht 5\' 1"  (1.549 m)  Wt 171 lb (77.565 kg)  BMI 32.33 kg/m2  SpO2 97%  Objective:   Physical Exam  Nursing note and vitals reviewed. Constitutional: She is oriented to person, place, and time. She appears well-developed and well-nourished. No distress.  HENT:  Head: Normocephalic and atraumatic.  Right Ear: External ear normal.  Left Ear: External ear normal.  Eyes: Conjunctivae are normal. No scleral icterus.  Neck: Normal range of motion. Neck supple. No thyromegaly present.  Cardiovascular: Normal rate, regular rhythm, normal heart sounds and  intact distal pulses.   Pulmonary/Chest: Effort normal. No accessory muscle usage. Not tachypneic. No respiratory distress. She has no decreased breath sounds. She has no wheezes. She has no rhonchi. She has rales in the right upper field, the right middle field and the right lower field.  Anterior rales bilaterally. Posterior rales on the right to upper, middle, and lower lobes.  Abdominal: She exhibits no distension.  Musculoskeletal: Normal range of motion. She exhibits no edema.  Lymphadenopathy:    She has no cervical adenopathy.  Neurological: She is alert and oriented to person, place, and time.  Skin: Skin is warm and dry. She is not diaphoretic. No erythema.  Psychiatric: She has a normal mood and affect. Her behavior is normal.       Results for orders placed in visit on 08/06/13  POCT CBC      Result Value Range   WBC 31.9 (*) 4.6 - 10.2 K/uL   Lymph, poc 2.9  0.6 - 3.4   POC LYMPH PERCENT 9.0 (*) 10 - 50 %L   MID (cbc) 0.8  0 - 0.9   POC MID % 2.5  0 - 12 %M   POC Granulocyte 28.2 (*) 2 - 6.9   Granulocyte percent 88.5 (*) 37 - 80 %G   RBC 4.58  4.04 - 5.48 M/uL   Hemoglobin 13.0  12.2 - 16.2 g/dL   HCT, POC 16.1  09.6 - 47.9 %   MCV 89.8  80 - 97 fL   MCH, POC 28.4  27 - 31.2 pg  MCHC 31.6 (*) 31.8 - 35.4 g/dL   RDW, POC 40.9     Platelet Count, POC 357  142 - 424 K/uL   MPV 7.5  0 - 99.8 fL   [facility] reading (PRIMARY) by  Dr. Clelia Croft. CXR: RLL pneumonia, no sig worsening since prior xray. Assessment & Plan:  4:07 PM- Will order blood labs and a chest x-ray. Discussed concern for worsening pneumonia. Discussed treatment plan with patient at bedside and patient verbalized agreement.   Shortness of breath - Plan: POCT CBC, DG Chest 2 View  Pneumonia - Plan: cefTRIAXone (ROCEPHIN) injection 1 g - Pt received IM Rocephin x 1 5d ago, will repeat today. Start azithro 500mg  qd x 3d tomorrow. Cont Avelox for 5 additional d. Recheck here in 2d. RTC sooner if worsening at  all.  Antiphospholipid antibody syndrome  Hiatal hernia - cont bid ppi and cons restarting carafate.  Meds ordered this encounter  Medications  . sucralfate (CARAFATE) 1 G tablet    Sig: Take 1 tablet (1 g total) by mouth 4 (four) times daily.    Dispense:  60 tablet    Refill:  0  . cefTRIAXone (ROCEPHIN) injection 1 g    Sig:   . azithromycin (ZITHROMAX) 500 MG tablet    Sig: Take 1 tablet (500 mg total) by mouth daily.    Dispense:  3 tablet    Refill:  0    I personally performed the services described in this documentation, which was scribed in my presence. The recorded information has been reviewed and considered, and addended by me as needed.  [name], MD MPH

## 2013-08-08 ENCOUNTER — Ambulatory Visit (INDEPENDENT_AMBULATORY_CARE_PROVIDER_SITE_OTHER): Payer: Medicare Other | Admitting: Family Medicine

## 2013-08-08 VITALS — BP 122/64 | HR 76 | Temp 97.5°F | Resp 17 | Ht 61.0 in | Wt 169.0 lb

## 2013-08-08 DIAGNOSIS — J189 Pneumonia, unspecified organism: Secondary | ICD-10-CM

## 2013-08-08 DIAGNOSIS — K449 Diaphragmatic hernia without obstruction or gangrene: Secondary | ICD-10-CM

## 2013-08-08 LAB — POCT CBC
Lymph, poc: 2.7 (ref 0.6–3.4)
MCHC: 31.8 g/dL (ref 31.8–35.4)
MPV: 7.3 fL (ref 0–99.8)
POC Granulocyte: 7.5 — AB (ref 2–6.9)
POC LYMPH PERCENT: 25 %L (ref 10–50)
POC MID %: 6.8 %M (ref 0–12)
RDW, POC: 14.8 %

## 2013-08-08 NOTE — Progress Notes (Signed)
[address]   [place], [place]  [address]   [phone]  Subjective:    Patient ID: Latoya [name], female    DOB: June 09, 1946, 67 y.o.   MRN: [id number]  HPI This 67 y.o. female presents for 48 hour follow-up of community acquired pneumonia.   Feels improved, but didn't sleep well last night. Had trouble breathing and pain on right side. Ate late and this bothered her hiatal hernia. Sleeps with head elevated always. Sweats and chills resolved. No fever. No cough.  No sputum production despite taking Mucinex. SOB only when lying on R side at night; no DOE.   Tolerating azithromycin well.  Compliance with Avelox. Scheduled for repeat CXR on 08/31/13 with Dr. Clelia Croft. Presenting for repeat CBC due to WBC count of 30, 000 at last visit.  Mild intermittent ear congestion and chronic nasal congestion.    Wonders about taking carafate with levothyroxine.  Has known HH: wonders if HH is causing aspiration pneumonia.  Has history of recurrent pneumonia.  "I had pneumonia 50+ times in childhood"  Review of Systems  Constitutional: Negative for fever, chills and diaphoresis.  HENT: Positive for congestion. Negative for ear pain.   Respiratory: Positive for shortness of breath. Negative for wheezing and stridor.   Gastrointestinal: Positive for vomiting. Negative for diarrhea.  Skin: Negative for rash.   Worsening head congestion. Allergic to ragweed.  Past Medical History  Diagnosis Date  . Asthma     extrinsic; moderate, persistant, nml spirometry 2010, nml CXR 1/08  . Pulmonary embolism     related to back surgery with prior coumadin use, now off  . DVT (deep venous thrombosis)   . PAF (paroxysmal atrial fibrillation)     not on coumadin therapy  . HLD (hyperlipidemia)   . CAD (coronary artery disease)     50% mid LAD, 80% ostial D1 and moderate 80% mid circ by vath 2003  . Antiphospholipid syndrome     with hypercoagulable state  . Coronary heart disease   . Drug allergy    heparin/lovenox  . Erosive gastritis 1994  . Anemia, iron deficiency   . HTN (hypertension)     [name] at [facility], manages pt.   . Complication of anesthesia     cardiac arrest- in OR, at age 67 (6)y.o. during Scalenotomy   . Anxiety   . Hypothyroidism   . Bladder troubles     REPORTS INFECTIONS ON OCCASION DUE TO URETHRA MEATUS STRICTURE AT BIRTH   . H/O hiatal hernia   . Spondylosis, lumbosacral     ARTHRITIS- OA   . Cancer     basal cell removed fr. L arm   . Blood dyscrasia   . Liver spot     cyst- - no biopsy, but told that its benign   . GERD (gastroesophageal reflux disease)    Allergies  Allergen Reactions  . Hornet Venom Anaphylaxis  . Amoxicillin-Pot Clavulanate Diarrhea    "patient stated that she can take this medication"  . Enoxaparin Sodium     REACTION: thrombocytopenia  . Heparin     REACTION: thrombocytopenia  . Lactose Intolerance (Gi)   . Other     Sensitive to dye in Betadine & Chlorohexadine   . Pork-Derived Products   . Povidone     Sensitivity- but if its wiped off she is able to tolerate betadine   . Indomethacin Rash   Current Outpatient Prescriptions on File Prior to Visit  Medication Sig Dispense Refill  .  albuterol (PROVENTIL) (2.5 MG/3ML) 0.083% nebulizer solution Take 2.5 mg by nebulization every 6 (six) hours as needed. For shortness of breath/wheezing      . aspirin 81 MG EC tablet Take 81 mg by mouth daily.       Marland Kitchen atorvastatin (LIPITOR) 20 MG tablet TAKE 1 TABLET (20 MG TOTAL) BY MOUTH DAILY.  30 tablet  0  . azelastine (ASTELIN) 137 MCG/SPRAY nasal spray Place 1 spray into the nose 2 (two) times daily as needed. Use in each nostril as directed For allergies      . azithromycin (ZITHROMAX) 500 MG tablet Take 1 tablet (500 mg total) by mouth daily.  3 tablet  0  . beclomethasone (QVAR) 80 MCG/ACT inhaler Inhale 2 puffs into the lungs 3 (three) times daily.      . Calcium Carbonate-Vitamin D 600-400 MG-UNIT per tablet Take 1 tablet  by mouth 2 (two) times daily.        . cyclobenzaprine (FLEXERIL) 10 MG tablet Take 10 mg by mouth 3 (three) times daily as needed. For severe muscle pain      . diazepam (VALIUM) 5 MG tablet Take 5 mg by mouth as needed.       . Diphenhyd-Hydrocort-Nystatin (FIRST-DUKES MOUTHWASH) SUSP Use as directed 5 mLs in the mouth or throat every 2 (two) hours as needed (sore throat).  237 mL  0  . docusate sodium (COLACE) 100 MG capsule Take 200 mg by mouth 2 (two) times daily.       Marland Kitchen esomeprazole (NEXIUM) 40 MG capsule Take 40 mg by mouth 2 (two) times daily.       . fentaNYL (DURAGESIC - DOSED MCG/HR) 100 MCG/HR Place 1 patch onto the skin every other day.       . ferrous sulfate (FEOSOL) 325 (65 FE) MG tablet Take 325 mg by mouth every other day.       . gabapentin (NEURONTIN) 600 MG tablet Take 900 mg by mouth at bedtime.       . Glucosamine-Chondroitin 750-600 MG TABS Take 2 tablets by mouth 2 (two) times daily.       Marland Kitchen HYDROmorphone (DILAUDID) 4 MG tablet Take 4-8 mg by mouth every 4 (four) hours as needed. For pain      . levothyroxine (SYNTHROID, LEVOTHROID) 75 MCG tablet Take 75 mcg by mouth daily before breakfast.       . lidocaine (LIDODERM) 5 % Place 1 patch onto the skin as needed. Remove & Discard patch within 12 hours or as directed by MD      . Loratadine-Pseudoephedrine (CLARITIN-D 12 HOUR PO) Take 1 tablet by mouth 2 (two) times daily.       . metaxalone (SKELAXIN) 800 MG tablet Take 800 mg by mouth every 8 (eight) hours as needed. For muscle pain      . metoprolol (LOPRESSOR) 50 MG tablet TAKE ONE TABLET BY MOUTH TWICE A DAY  60 tablet  10  . montelukast (SINGULAIR) 10 MG tablet Take 10 mg by mouth at bedtime.       . moxifloxacin (AVELOX) 400 MG tablet Take 1 tablet (400 mg total) by mouth daily.  10 tablet  0  . Multiple Vitamin (MULTIVITAMIN WITH MINERALS) TABS Take 1 tablet by mouth daily.      . nitroGLYCERIN (NITROSTAT) 0.4 MG SL tablet Place 0.4 mg under the tongue every 5 (five)  minutes as needed. For chest pain      . pirbuterol (MAXAIR) 200 MCG/INH inhaler  Inhale 2 puffs into the lungs 4 (four) times daily.      Marland Kitchen triamcinolone (NASACORT) 55 MCG/ACT nasal inhaler Place 2 sprays into the nose 2 (two) times daily.       Marland Kitchen EPINEPHrine (EPIPEN) 0.3 mg/0.3 mL DEVI Inject 0.3 mLs (0.3 mg total) into the muscle once.  1 Device  2  . sucralfate (CARAFATE) 1 G tablet Take 1 tablet (1 g total) by mouth 4 (four) times daily.  60 tablet  0   No current facility-administered medications on file prior to visit.        Objective:   Physical Exam  Nursing note and vitals reviewed. Constitutional: She is oriented to person, place, and time. She appears well-developed and well-nourished. No distress.  HENT:  Right Ear: Tympanic membrane, external ear and ear canal normal.  Left Ear: Tympanic membrane, external ear and ear canal normal.  Mouth/Throat: Oropharynx is clear and moist.  Eyes: Conjunctivae are normal. Right eye exhibits no discharge. Left eye exhibits no discharge.  Neck: Normal range of motion. Neck supple.  Right cervical nodes present.  Cardiovascular: Normal rate, regular rhythm and normal heart sounds.   Pulmonary/Chest: Effort normal. No respiratory distress. She has no wheezes. She has rales. She exhibits no tenderness.  Right mid and lower lung with crackles.  Musculoskeletal: She exhibits no edema.  Lymphadenopathy:    She has cervical adenopathy.  Neurological: She is alert and oriented to person, place, and time.  Skin: Skin is warm and dry. She is not diaphoretic.  Psychiatric: She has a normal mood and affect. Her behavior is normal. Judgment and thought content normal.   Results for orders placed in visit on 08/08/13  POCT CBC      Result Value Range   WBC 11.0 (*) 4.6 - 10.2 K/uL   Lymph, poc 2.7  0.6 - 3.4   POC LYMPH PERCENT 25.0  10 - 50 %L   MID (cbc) 0.7  0 - 0.9   POC MID % 6.8  0 - 12 %M   POC Granulocyte 7.5 (*) 2 - 6.9    Granulocyte percent 68.2  37 - 80 %G   RBC 4.72  4.04 - 5.48 M/uL   Hemoglobin 13.5  12.2 - 16.2 g/dL   HCT, POC 16.1  09.6 - 47.9 %   MCV 89.8  80 - 97 fL   MCH, POC 28.6  27 - 31.2 pg   MCHC 31.8  31.8 - 35.4 g/dL   RDW, POC 04.5     Platelet Count, POC 373  142 - 424 K/uL   MPV 7.3  0 - 99.8 fL        Assessment & Plan:  Pneumonia - Plan: POCT CBC  Hiatal hernia  1. Pneumonia:  Improving clinically. Improved white blood cell count.  Finish antibiotic. Return as instructed for repeat CXR. Return sooner if worsening fever/chills/sob. No water aerobics for one additional week.  2.  Hiatal Hernia: stable. Discussed taking Carafate at least 2 hours after levothyroxine. Eat small meals, avoid eating close to bedtime. Consider return to GI if symptoms persist and can evaluate relationship of hiatal hernia to frequent pneumonia.

## 2013-08-11 ENCOUNTER — Telehealth: Payer: Self-pay | Admitting: Cardiovascular Disease

## 2013-08-11 NOTE — Telephone Encounter (Signed)
New Problem:  Pt states she wants to be seen by doctor McAlhany sooner than December. Pt states she does not want to see the PA or NP. Pt states she and her husband are moving to [place] very soon and she wants to be seen ASAP. Pt wants to know if she can if she can be worked in this month. Please advise

## 2013-08-11 NOTE — Telephone Encounter (Signed)
Pt called because pt has an appointment with Dr. Clifton James on 09/29/13. Pt  would like to have her appointment change  to an earlier date,  because she is moving to [place] in the middle of November and she is afraid that she won't be able to find a cardiology soon after moving. Pt does not want to see a PA/NP. Pt wants to make sure that she has an echo and carotid duplex before she go. Pt is aware that Dr Clifton James does not have any openings, so I will send this message to MD and nurse for reviewing.

## 2013-08-12 DIAGNOSIS — L570 Actinic keratosis: Secondary | ICD-10-CM | POA: Diagnosis not present

## 2013-08-12 DIAGNOSIS — Z85828 Personal history of other malignant neoplasm of skin: Secondary | ICD-10-CM | POA: Diagnosis not present

## 2013-08-12 NOTE — Telephone Encounter (Signed)
Spoke with pt and offered her appt this afternoon with Dr. Clifton James but she cannot be here today.  Appt made for September 01, 2013 at 11:45 with Dr. Clifton James

## 2013-08-12 NOTE — Telephone Encounter (Signed)
Agree. Thanks

## 2013-08-13 DIAGNOSIS — I6529 Occlusion and stenosis of unspecified carotid artery: Secondary | ICD-10-CM | POA: Diagnosis not present

## 2013-08-13 DIAGNOSIS — E785 Hyperlipidemia, unspecified: Secondary | ICD-10-CM | POA: Diagnosis not present

## 2013-08-13 DIAGNOSIS — E039 Hypothyroidism, unspecified: Secondary | ICD-10-CM | POA: Diagnosis not present

## 2013-08-13 DIAGNOSIS — I2510 Atherosclerotic heart disease of native coronary artery without angina pectoris: Secondary | ICD-10-CM | POA: Diagnosis not present

## 2013-08-14 ENCOUNTER — Ambulatory Visit: Payer: Medicare Other | Admitting: Physician Assistant

## 2013-08-21 DIAGNOSIS — G894 Chronic pain syndrome: Secondary | ICD-10-CM | POA: Diagnosis not present

## 2013-08-21 DIAGNOSIS — M961 Postlaminectomy syndrome, not elsewhere classified: Secondary | ICD-10-CM | POA: Diagnosis not present

## 2013-08-21 DIAGNOSIS — Z79899 Other long term (current) drug therapy: Secondary | ICD-10-CM | POA: Diagnosis not present

## 2013-08-26 ENCOUNTER — Ambulatory Visit: Payer: Medicare Other

## 2013-08-26 ENCOUNTER — Ambulatory Visit (INDEPENDENT_AMBULATORY_CARE_PROVIDER_SITE_OTHER): Payer: Medicare Other | Admitting: Family Medicine

## 2013-08-26 VITALS — BP 132/88 | HR 75 | Temp 97.7°F | Resp 16 | Ht 62.5 in | Wt 170.4 lb

## 2013-08-26 DIAGNOSIS — R918 Other nonspecific abnormal finding of lung field: Secondary | ICD-10-CM | POA: Diagnosis not present

## 2013-08-26 DIAGNOSIS — J069 Acute upper respiratory infection, unspecified: Secondary | ICD-10-CM | POA: Diagnosis not present

## 2013-08-26 DIAGNOSIS — J309 Allergic rhinitis, unspecified: Secondary | ICD-10-CM | POA: Diagnosis not present

## 2013-08-26 DIAGNOSIS — R9389 Abnormal findings on diagnostic imaging of other specified body structures: Secondary | ICD-10-CM

## 2013-08-26 LAB — POCT CBC
Hemoglobin: 13.6 g/dL (ref 12.2–16.2)
MCH, POC: 28.5 pg (ref 27–31.2)
MCV: 90.8 fL (ref 80–97)
MPV: 7.9 fL (ref 0–99.8)
RBC: 4.77 M/uL (ref 4.04–5.48)
WBC: 7 10*3/uL (ref 4.6–10.2)

## 2013-08-26 NOTE — Patient Instructions (Signed)
Practice good deep breathing  Give the upper respiratory congestion sometimes run its course  Return if worse

## 2013-08-26 NOTE — Progress Notes (Signed)
Subjective:  67 year old lady who has a history of a pneumonia this fall. The chest x-ray had not cleared completely. She was asked to come back for a followup film. She had improved from the pneumonia, but she has an respiratory infection now with draining purulent mucus from her nose last night. She has not been running a fever. They're getting ready to move to [place], and all the housework is strep test which is aggravating her allergies.  Objective: Pleasant lady in no major acute distress. TMs normal. Throat clear. Neck supple without significant nodes. Chest is clear to auscultation except for one little area in the right mid back where I could hear a little rhonchorous. Heart was regular without murmurs.  Assessment: History of abnormal chest x-ray with persistent infiltrate History pneumonia A pursestring flexion  Plan: CBC and chest x-ray and proceed from there  Results for orders placed in visit on 08/26/13  POCT CBC      Result Value Range   WBC 7.0  4.6 - 10.2 K/uL   Lymph, poc 3.1  0.6 - 3.4   POC LYMPH PERCENT 43.6  10 - 50 %L   MID (cbc) 0.7  0 - 0.9   POC MID % 10.3  0 - 12 %M   POC Granulocyte 3.2  2 - 6.9   Granulocyte percent 46.1  37 - 80 %G   RBC 4.77  4.04 - 5.48 M/uL   Hemoglobin 13.6  12.2 - 16.2 g/dL   HCT, POC 40.9  81.1 - 47.9 %   MCV 90.8  80 - 97 fL   MCH, POC 28.5  27 - 31.2 pg   MCHC 31.4 (*) 31.8 - 35.4 g/dL   RDW, POC 91.4     Platelet Count, POC 287  142 - 424 K/uL   MPV 7.9  0 - 99.8 fL   [facility] reading (PRIMARY) by  Dr. Alwyn Ren Mild atalectasis right lung.  Pneumonia is resolved.  No new rx.

## 2013-08-27 ENCOUNTER — Other Ambulatory Visit: Payer: Self-pay

## 2013-08-31 DIAGNOSIS — T6391XA Toxic effect of contact with unspecified venomous animal, accidental (unintentional), initial encounter: Secondary | ICD-10-CM | POA: Diagnosis not present

## 2013-08-31 DIAGNOSIS — J45909 Unspecified asthma, uncomplicated: Secondary | ICD-10-CM | POA: Diagnosis not present

## 2013-08-31 DIAGNOSIS — J309 Allergic rhinitis, unspecified: Secondary | ICD-10-CM | POA: Diagnosis not present

## 2013-09-01 ENCOUNTER — Encounter: Payer: Self-pay | Admitting: Cardiovascular Disease

## 2013-09-01 ENCOUNTER — Ambulatory Visit (INDEPENDENT_AMBULATORY_CARE_PROVIDER_SITE_OTHER): Payer: Medicare Other | Admitting: Cardiovascular Disease

## 2013-09-01 VITALS — BP 132/76 | HR 90 | Ht 62.5 in | Wt 174.0 lb

## 2013-09-01 DIAGNOSIS — I480 Paroxysmal atrial fibrillation: Secondary | ICD-10-CM

## 2013-09-01 DIAGNOSIS — E785 Hyperlipidemia, unspecified: Secondary | ICD-10-CM | POA: Diagnosis not present

## 2013-09-01 DIAGNOSIS — I658 Occlusion and stenosis of other precerebral arteries: Secondary | ICD-10-CM

## 2013-09-01 DIAGNOSIS — I10 Essential (primary) hypertension: Secondary | ICD-10-CM | POA: Diagnosis not present

## 2013-09-01 DIAGNOSIS — I4891 Unspecified atrial fibrillation: Secondary | ICD-10-CM

## 2013-09-01 DIAGNOSIS — I2510 Atherosclerotic heart disease of native coronary artery without angina pectoris: Secondary | ICD-10-CM

## 2013-09-01 DIAGNOSIS — M79609 Pain in unspecified limb: Secondary | ICD-10-CM

## 2013-09-01 DIAGNOSIS — I6529 Occlusion and stenosis of unspecified carotid artery: Secondary | ICD-10-CM | POA: Diagnosis not present

## 2013-09-01 DIAGNOSIS — I6523 Occlusion and stenosis of bilateral carotid arteries: Secondary | ICD-10-CM

## 2013-09-01 MED ORDER — ATORVASTATIN CALCIUM 20 MG PO TABS: 20.0000 mg | ORAL_TABLET | Freq: Every day | ORAL | Status: DC

## 2013-09-01 MED ORDER — NITROGLYCERIN 0.4 MG SL SUBL: 0.4000 mg | SUBLINGUAL_TABLET | SUBLINGUAL | Status: DC | PRN

## 2013-09-01 MED ORDER — METOPROLOL TARTRATE 50 MG PO TABS: 50.0000 mg | ORAL_TABLET | Freq: Two times a day (BID) | ORAL | Status: DC

## 2013-09-01 NOTE — Patient Instructions (Signed)
Your physician recommends that you schedule a follow-up appointment as needed with Dr. McAlhany   

## 2013-09-01 NOTE — Progress Notes (Signed)
History of Present Illness: 67 yo female with HTN, hyperlipidemia, anti-phospholipid antibody syndrome, GERD, asthma, CAD s/p cath 2003 with 30% ostial LM stenosis, 50% mid LAD, 80% moderate sized Diagonal, 80% Circumflex. There was discussion about bypass but ultimately medical management was pursued. She also has chronic back pain and paroxysmal atrial fibrillation. She has been previously followed by Dr. [name] in our office. She has a hypercoagulable state but has not been managed on coumadin recently. She has allergies to heparin (anaphylaxis) and Lovenox. Her coumadin had been stopped in the past because of trouble regulating her dose. She is seen by a hematologist at [facility] (Dr. Graylon Gunning) and he has told her that she does not need coumadin. She had been on amiodarone in the past but this was stopped in August 2010 by Dr. [name]. She has been diagnosed with hypothyroidism and has been on Synthroid. She has had no lower extremity edema but she does have chronic problems with pain in her feet from Raynaud's phenomenon and ongoing sciatica. Most recent stress test October 2013 with no ischemia, normal LV function.  Last echo June 2014 with normal LV size, mild LVH, normal LV systolic function with EF of 55%, mild MR with mildly thickened mitral valve leaflets. Carotid artery dopplers 11/2012 with stable 40-59% bilateral stenosis.   She tells me today that she has been feeling well. She has had no chest pain. Her breathing has been ok. BP has been well controlled at home. She is moving to [place] next week.   Primary Care Physician: Shary Decamp   Last Lipid Profile: Followed in primary care.   Past Medical History  Diagnosis Date  . Asthma     extrinsic; moderate, persistant, nml spirometry 2010, nml CXR 1/08  . Pulmonary embolism     related to back surgery with prior coumadin use, now off  . DVT (deep venous thrombosis)   . PAF (paroxysmal atrial fibrillation)     not on coumadin therapy  .  HLD (hyperlipidemia)   . CAD (coronary artery disease)     50% mid LAD, 80% ostial D1 and moderate 80% mid circ by vath 2003  . Antiphospholipid syndrome     with hypercoagulable state  . Coronary heart disease   . Drug allergy     heparin/lovenox  . Erosive gastritis 1994  . Anemia, iron deficiency   . HTN (hypertension)     [name] at [facility], manages pt.   . Complication of anesthesia     cardiac arrest- in OR, at age 87 (9)y.o. during Scalenotomy   . Anxiety   . Hypothyroidism   . Bladder troubles     REPORTS INFECTIONS ON OCCASION DUE TO URETHRA MEATUS STRICTURE AT BIRTH   . H/O hiatal hernia   . Spondylosis, lumbosacral     ARTHRITIS- OA   . Cancer     basal cell removed fr. L arm   . Blood dyscrasia   . Liver spot     cyst- - no biopsy, but told that its benign   . GERD (gastroesophageal reflux disease)     Past Surgical History  Procedure Laterality Date  . Back surgery      x12 cervical and lumbar thoracic spine surgery  . Total knee arthroplasty      left  . Cleft lip and palate repair      67 yo   . Hysterectomy    . Spina bifida repair      67 yo   .  Cardiac catheterization      2005  . Joint replacement    . Tonsillectomy    . Breast surgery      all benign cysts x3   . Abdominal hysterectomy      partial abdominal -1998    Current Outpatient Prescriptions  Medication Sig Dispense Refill  . aspirin 81 MG EC tablet Take 81 mg by mouth daily.       Marland Kitchen atorvastatin (LIPITOR) 20 MG tablet TAKE 1 TABLET (20 MG TOTAL) BY MOUTH DAILY.  30 tablet  0  . azelastine (ASTELIN) 137 MCG/SPRAY nasal spray Place 1 spray into the nose 2 (two) times daily as needed. Use in each nostril as directed For allergies      . beclomethasone (QVAR) 80 MCG/ACT inhaler Inhale 2 puffs into the lungs 3 (three) times daily.      . Calcium Carbonate-Vitamin D 600-400 MG-UNIT per tablet Take 1 tablet by mouth 2 (two) times daily.        . cyclobenzaprine (FLEXERIL) 10 MG tablet  Take 10 mg by mouth 3 (three) times daily as needed. For severe muscle pain      . diazepam (VALIUM) 5 MG tablet Take 5 mg by mouth as needed.       . Diphenhyd-Hydrocort-Nystatin (FIRST-DUKES MOUTHWASH) SUSP Use as directed 5 mLs in the mouth or throat every 2 (two) hours as needed (sore throat).  237 mL  0  . docusate sodium (COLACE) 100 MG capsule Take 200 mg by mouth 2 (two) times daily.       Marland Kitchen EPINEPHrine (EPIPEN) 0.3 mg/0.3 mL DEVI Inject 0.3 mLs (0.3 mg total) into the muscle once.  1 Device  2  . esomeprazole (NEXIUM) 40 MG capsule Take 40 mg by mouth 2 (two) times daily.       . fentaNYL (DURAGESIC - DOSED MCG/HR) 100 MCG/HR Place 1 patch onto the skin every other day.       . ferrous sulfate (FEOSOL) 325 (65 FE) MG tablet Take 325 mg by mouth every other day.       . gabapentin (NEURONTIN) 600 MG tablet Take 900 mg by mouth at bedtime.       . Glucosamine HCl (GLUCOSAMINE PO) Take by mouth. Glucosamine ASU      . HYDROmorphone (DILAUDID) 4 MG tablet Take 4-8 mg by mouth every 4 (four) hours as needed. For pain      . levothyroxine (SYNTHROID, LEVOTHROID) 75 MCG tablet Take 75 mcg by mouth daily before breakfast.       . lidocaine (LIDODERM) 5 % Place 1 patch onto the skin as needed. Remove & Discard patch within 12 hours or as directed by MD      . Loratadine-Pseudoephedrine (CLARITIN-D 12 HOUR PO) Take 1 tablet by mouth 2 (two) times daily.       . metaxalone (SKELAXIN) 800 MG tablet Take 800 mg by mouth every 8 (eight) hours as needed. For muscle pain      . metoprolol (LOPRESSOR) 50 MG tablet TAKE ONE TABLET BY MOUTH TWICE A DAY  60 tablet  10  . montelukast (SINGULAIR) 10 MG tablet Take 10 mg by mouth at bedtime.       . Multiple Vitamin (MULTIVITAMIN WITH MINERALS) TABS Take 1 tablet by mouth daily.      . nitroGLYCERIN (NITROSTAT) 0.4 MG SL tablet Place 0.4 mg under the tongue every 5 (five) minutes as needed. For chest pain      .  pirbuterol (MAXAIR) 200 MCG/INH inhaler Inhale 2  puffs into the lungs 4 (four) times daily.      . sucralfate (CARAFATE) 1 G tablet Take 1 tablet (1 g total) by mouth 4 (four) times daily.  60 tablet  0  . triamcinolone (NASACORT) 55 MCG/ACT nasal inhaler Place 2 sprays into the nose 2 (two) times daily.       Marland Kitchen albuterol (PROVENTIL) (2.5 MG/3ML) 0.083% nebulizer solution Take 2.5 mg by nebulization every 6 (six) hours as needed. For shortness of breath/wheezing       No current facility-administered medications for this visit.    Allergies  Allergen Reactions  . Hornet Venom Anaphylaxis  . Amoxicillin-Pot Clavulanate Diarrhea    "patient stated that she can take this medication"  . Enoxaparin Sodium     REACTION: thrombocytopenia  . Heparin     REACTION: thrombocytopenia  . Lactose Intolerance (Gi)   . Other     Sensitive to dye in Betadine & Chlorohexadine   . Pork-Derived Products   . Povidone     Sensitivity- but if its wiped off she is able to tolerate betadine   . Indomethacin Rash    History   Social History  . Marital Status: Married    Spouse Name: N/A    Number of Children: N/A  . Years of Education: N/A   Occupational History  . Not on file.   Social History Main Topics  . Smoking status: Former Smoker    Quit date: 10/22/1972  . Smokeless tobacco: Never Used     Comment: no smoking   . Alcohol Use: No  . Drug Use: No  . Sexual Activity: Not on file   Other Topics Concern  . Not on file   Social History Narrative   Lives in [place] with husband; has had miscarriages but no children.    Disabled but exercises nearly every day (can only exericse in water - aerobics)   Takes opioids for pain relief; takes no herbal medications; has a heart healthy diet.     Family History  Problem Relation Age of Onset  . Coronary artery disease Sister     MI     Review of Systems:  As stated in the HPI and otherwise negative.   BP 132/76  Pulse 90  Ht 5' 2.5" (1.588 m)  Wt 174 lb (78.926 kg)  BMI 31.30 kg/m2   SpO2 95%  Physical Examination: General: Well developed, well nourished, NAD HEENT: OP clear, mucus membranes moist SKIN: warm, dry. No rashes. Neuro: No focal deficits Musculoskeletal: Muscle strength 5/5 all ext Psychiatric: Mood and affect normal Neck: No JVD, no carotid bruits, no thyromegaly, no lymphadenopathy. Lungs:Clear bilaterally, no wheezes, rhonci, crackles Cardiovascular: Regular rate and rhythm. No murmurs, gallops or rubs. Abdomen:Soft. Bowel sounds present. Non-tender.  Extremities: No lower extremity edema. Pulses are trace in the bilateral DP/PT.    Echo 04/12/13: Left ventricle: The cavity size was normal. Systolic function was normal. The estimated ejection fraction was in the range of 50% to 55%.   ABI: Normal June 2014  Carotid artery dopplers: February 2014: Moderate bilateral stenosis, 40-59% stenosis bilateral ICA  Stress myoview: 08/13/12: Stress Procedure: The patient received IV Lexiscan 0.4 mg over 15-seconds. Technetium 52m Sestamibi injected at 30-seconds. There were significant changes with Lexiscan. The patient complained of chest pain with Lexiscan. Quantitative spect images were obtained after a 45 minute delay.  The patient complained of persistent headache with flushing of face and  neck after Lexiscan that was relieved quickly after Aminophylline 75 mg IVP given.  Stress ECG: No significant change from baseline ECG  QPS  Raw Data Images: Patient motion noted.  Stress Images: lateral wall not interpretable due to bowel artifact  Rest Images: lateral wall not interpretable due to bowel artifact  Subtraction (SDS): Normal  Transient Ischemic Dilatation (Normal <1.22): 1.03  Lung/Heart Ratio (Normal <0.45): 0.45  Quantitative Gated Spect Images  QGS EDV: 95 ml  QGS ESV: 50 ml  Impression  Exercise Capacity: Lexiscan with no exercise.  BP Response: Normal blood pressure response.  Clinical Symptoms: Headache requiring amynophylline  ECG  Impression: No significant ST segment change suggestive of ischemia.  Comparison with Prior Nuclear Study: No images to compare  Overall Impression: Apical thinning no obvious ischemia Lateral wall not interpretable due to a large area of bowel artifact on both resting and stress images  LV Ejection Fraction: 47%. LV Wall Motion: Apical hypokinesis  Cardiac cath 09/22/02: 1. Ventriculography was performed in the RAO projection. Overall ejection  fraction was 70%. No segmental wall motion abnormalities were  identified.  2. The left main coronary artery has, what appears to be, about 30%  narrowing at the ostium. In most views there does appear to be a patent  ostium; however, some tapered narrowing cannot be excluded.  3. The LAD proper has about 40-50% narrowing, at most, in the mid portion  beyond the diagonal takeoff. The vessel is calcified. The diagonal  takeoff itself has a long 80% stenosis at the ostium extending out into  the mid portion of the diagonal. The distal diagonal does appear to be  suitable for grafting.  4. The large circumflex has about 70-80% focal narrowing prior to the  bifurcation of the marginal.  5. The right coronary artery has some mild luminal irregularities but no  high-grade stenoses.  IMPRESSION:  1. Coronary artery disease with significant involvement and circumflex  involvement.  2. Multiple medical issues, as described above.    Assessment and Plan:   1. CAD: Stress test 2013 without ischemia. No chest pain. She is known to have moderate CAD. Continue current medical management   2. PAROXYSMAL ATRIAL FIBRILLATION: Occasional palpitations. She has not been on coumadin recently per recommendations of her hematologist. She will consider Xarelto in the future. EKG with NSR today.   3. HYPERTENSION: BP is controlled today.    4. CAROTID ARTERY DISEASE: Stable by dopplers 2/14. No changes. Repeat Feburary 2015  5. Bilateral foot pain: LE arterial  dopplers June 2014 with normal ABI, no evidence of obstructive lower extremity arterial disease. Likely related to Raynaud's.   6. Hyperlipidemia: Continue statin. Lipids followed in primary care.

## 2013-09-29 ENCOUNTER — Ambulatory Visit: Payer: Medicare Other | Admitting: Cardiovascular Disease

## 2013-10-19 DIAGNOSIS — Z23 Encounter for immunization: Secondary | ICD-10-CM | POA: Diagnosis not present

## 2013-11-10 ENCOUNTER — Ambulatory Visit (INDEPENDENT_AMBULATORY_CARE_PROVIDER_SITE_OTHER): Payer: Medicare Other | Admitting: Family Medicine

## 2013-11-10 VITALS — BP 137/76 | HR 77 | Temp 97.6°F | Resp 17 | Wt 176.0 lb

## 2013-11-10 DIAGNOSIS — R82998 Other abnormal findings in urine: Secondary | ICD-10-CM

## 2013-11-10 DIAGNOSIS — R5383 Other fatigue: Principal | ICD-10-CM

## 2013-11-10 DIAGNOSIS — R509 Fever, unspecified: Secondary | ICD-10-CM | POA: Diagnosis not present

## 2013-11-10 DIAGNOSIS — R5381 Other malaise: Secondary | ICD-10-CM | POA: Diagnosis not present

## 2013-11-10 DIAGNOSIS — R8281 Pyuria: Secondary | ICD-10-CM

## 2013-11-10 LAB — POCT URINALYSIS DIPSTICK
Bilirubin, UA: NEGATIVE
Glucose, UA: NEGATIVE
Ketones, UA: NEGATIVE
Leukocytes, UA: NEGATIVE
Nitrite, UA: NEGATIVE
Protein, UA: NEGATIVE
Spec Grav, UA: <=1.005
Urobilinogen, UA: 0.2
pH, UA: 6.5

## 2013-11-10 LAB — POCT CBC
Granulocyte percent: 79.2 %G (ref 37–80)
HCT, POC: 45 % (ref 37.7–47.9)
Hemoglobin: 14.4 g/dL (ref 12.2–16.2)
Lymph, poc: 2.8 (ref 0.6–3.4)
MCH, POC: 28.3 pg (ref 27–31.2)
MCHC: 32 g/dL (ref 31.8–35.4)
MCV: 88.5 fL (ref 80–97)
MID (cbc): 1.2 — AB (ref 0–0.9)
MPV: 7.7 fL (ref 0–99.8)
POC Granulocyte: 15.4 — AB (ref 2–6.9)
POC LYMPH PERCENT: 14.5 %L (ref 10–50)
POC MID %: 6.3 %M (ref 0–12)
Platelet Count, POC: 275 10*3/uL (ref 142–424)
RBC: 5.08 M/uL (ref 4.04–5.48)
RDW, POC: 13.8 %
WBC: 19.4 10*3/uL — AB (ref 4.6–10.2)

## 2013-11-10 LAB — POCT UA - MICROSCOPIC ONLY
Casts, Ur, LPF, POC: NEGATIVE
Crystals, Ur, HPF, POC: NEGATIVE
Mucus, UA: POSITIVE
Renal tubular cells: POSITIVE
Yeast, UA: NEGATIVE

## 2013-11-10 MED ORDER — CIPROFLOXACIN HCL 500 MG PO TABS: 500.0000 mg | ORAL_TABLET | Freq: Two times a day (BID) | ORAL | Status: DC

## 2013-11-10 MED ORDER — METHYLPREDNISOLONE ACETATE 80 MG/ML IJ SUSP: 40.0000 mg | Freq: Once | INTRAMUSCULAR | Status: DC

## 2013-11-10 NOTE — Patient Instructions (Signed)
Please return in 48 hours for recheck. Were running a urine culture because he due to passing her urine and we need to make sure that this is disappearing you feeling better.

## 2013-11-10 NOTE — Progress Notes (Addendum)
Subjective:    Patient ID: Latoya [name], female    DOB: 09/24/1946, 68 y.o.   MRN: [id number]  HPI  This chart was scribed for Synetta Shadow Lauenstein-MD by Celesta Gentile, Scribe. This patient was seen in room 5 and the patient's care was started at 6:11 PM.  HPI Comments: Latoya [name] is a 68 y.o. female who presents to the [facility] complaining of an intermittent subjective fever with associated chills that started about 2 days ago.  Temperature is currently 97.43F.  Pt states that when her fever spikes she becomes confused.  Pt also complains of nausea, otalgia, and dizziness.  Pt reports that the dizziness could be from vertigo.  Pt states that she was looking for cars and furniture last week, which she states was a dusty environment and caused her allergies to flare up.  Pt denies any urinary symptoms, back pain, and cough.     Past Surgical History  Procedure Laterality Date   Back surgery      x12 cervical and lumbar thoracic spine surgery   Total knee arthroplasty      left   Cleft lip and palate repair      68 yo    Hysterectomy     Spina bifida repair      33 yo    Cardiac catheterization      2005   Joint replacement     Tonsillectomy     Breast surgery      all benign cysts x3    Abdominal hysterectomy      partial abdominal -1998    Family History  Problem Relation Age of Onset   Coronary artery disease Sister     MI     History   Social History   Marital Status: Married    Spouse Name: N/A    Number of Children: N/A   Years of Education: N/A   Occupational History   Not on file.   Social History Main Topics   Smoking status: Former Smoker    Quit date: 10/22/1972   Smokeless tobacco: Never Used     Comment: no smoking    Alcohol Use: No   Drug Use: No   Sexual Activity: Not on file   Other Topics Concern   Not on file   Social History Narrative   Lives in [place] with husband; has had miscarriages but no  children.    Disabled but exercises nearly every day (can only exericse in water - aerobics)   Takes opioids for pain relief; takes no herbal medications; has a heart healthy diet.     Allergies  Allergen Reactions   Hornet Venom Anaphylaxis   Amoxicillin-Pot Clavulanate Diarrhea    "patient stated that she can take this medication"   Enoxaparin Sodium     REACTION: thrombocytopenia   Heparin     REACTION: thrombocytopenia   Lactose Intolerance (Gi)    Other     Sensitive to dye in Betadine & Chlorohexadine    Pork-Derived Products    Povidone     Sensitivity- but if its wiped off she is able to tolerate betadine    Indomethacin Rash    Patient Active Problem List   Diagnosis Date Noted   Anaphylaxis, mild, due to wasp envenomation 04/12/2013   Chest pain, atypical 04/11/2013   PAD (peripheral artery disease) 06/20/2011   ANEMIA, IRON DEFICIENCY, MICROCYTIC 06/01/2010   ANEMIA-UNSPECIFIED 06/01/2010   Primary hypercoagulable state 06/01/2010  BACK PAIN, CHRONIC 06/01/2010   ABDOMINAL PAIN -GENERALIZED 06/01/2010   CAROTID ARTERY DISEASE 05/25/2010   CAD, NATIVE VESSEL 11/15/2009   MURMUR 11/15/2009   CAROTID BRUIT, LEFT 11/15/2009   HYPERTENSION 03/18/2009   PULMONARY EMBOLISM 03/18/2009   PAROXYSMAL ATRIAL FIBRILLATION 03/18/2009   DVT 03/18/2009   ASTHMA 03/18/2009   GERD 03/18/2009   SPONDYLOSIS, LUMBAR 03/18/2009   HYPERLIPIDEMIA 11/29/2008   CORONARY HEART DISEASE 11/29/2008   EXTRINSIC ASTHMA, UNSPECIFIED 11/25/2008     Review of Systems  Constitutional: Positive for fever and chills.  HENT: Positive for ear pain. Negative for congestion and rhinorrhea.   Respiratory: Negative for cough, shortness of breath and wheezing.   Cardiovascular: Negative for chest pain.  Gastrointestinal: Positive for nausea. Negative for vomiting, abdominal pain and diarrhea.  Genitourinary: Negative for difficulty urinating.  Musculoskeletal:  Negative for back pain.  Skin: Negative for color change and rash.  Neurological: Positive for dizziness. Negative for syncope.       Objective:   Physical Exam Patient appears in no acute distress. She is obese and along with her obese husband, tend to talk over each other. Patient is convinced that she has allergies. HEENT: Unremarkable Chest: Clear Heart: Regular no murmur Extremities: No edema Patient is using a walker on wheels for ambulation. Results for orders placed in visit on 08/26/13  POCT CBC      Result Value Range   WBC 7.0  4.6 - 10.2 K/uL   Lymph, poc 3.1  0.6 - 3.4   POC LYMPH PERCENT 43.6  10 - 50 %L   MID (cbc) 0.7  0 - 0.9   POC MID % 10.3  0 - 12 %M   POC Granulocyte 3.2  2 - 6.9   Granulocyte percent 46.1  37 - 80 %G   RBC 4.77  4.04 - 5.48 M/uL   Hemoglobin 13.6  12.2 - 16.2 g/dL   HCT, POC 43.3  37.7 - 47.9 %   MCV 90.8  80 - 97 fL   MCH, POC 28.5  27 - 31.2 pg   MCHC 31.4 (*) 31.8 - 35.4 g/dL   RDW, POC 14.4     Platelet Count, POC 287  142 - 424 K/uL   MPV 7.9  0 - 99.8 fL   Results for orders placed in visit on 11/10/13  POCT UA - MICROSCOPIC ONLY      Result Value Range   WBC, Ur, HPF, POC 4-10     RBC, urine, microscopic 0-2     Bacteria, U Microscopic 1+     Mucus, UA pos     Epithelial cells, urine per micros 0-4     Crystals, Ur, HPF, POC neg     Casts, Ur, LPF, POC neg     Yeast, UA neg     Renal tubular cells pos    POCT URINALYSIS DIPSTICK      Result Value Range   Color, UA yellow     Clarity, UA cloudy     Glucose, UA neg     Bilirubin, UA neg     Ketones, UA neg     Spec Grav, UA <=1.005     Blood, UA small     pH, UA 6.5     Protein, UA neg     Urobilinogen, UA 0.2     Nitrite, UA neg     Leukocytes, UA Negative    POCT CBC      Result Value Range  WBC 19.4 (*) 4.6 - 10.2 K/uL   Lymph, poc 2.8  0.6 - 3.4   POC LYMPH PERCENT 14.5  10 - 50 %L   MID (cbc) 1.2 (*) 0 - 0.9   POC MID % 6.3  0 - 12 %M   POC  Granulocyte 15.4 (*) 2 - 6.9   Granulocyte percent 79.2  37 - 80 %G   RBC 5.08  4.04 - 5.48 M/uL   Hemoglobin 14.4  12.2 - 16.2 g/dL   HCT, POC 45.0  37.7 - 47.9 %   MCV 88.5  80 - 97 fL   MCH, POC 28.3  27 - 31.2 pg   MCHC 32.0  31.8 - 35.4 g/dL   RDW, POC 13.8     Platelet Count, POC 275  142 - 424 K/uL   MPV 7.7  0 - 99.8 fL        Assessment & Plan:  Patient is convinced that she has allergies. She was exposed to dust yesterday so I suppose it's possible. Nevertheless, the high white count suggests some source of infection. There is some pyuria so we'll go ahead and treat for urinary infection and have her follow up in 48 hours when the urine culture should be done. .Other malaise and fatigue - Plan: POCT UA - Microscopic Only, POCT urinalysis dipstick, POCT CBC, DISCONTINUED: methylPREDNISolone acetate (DEPO-MEDROL) injection 40 mg  Pyuria - Plan: ciprofloxacin (CIPRO) 500 MG tablet, Urine culture    Signed, Robyn Haber, MD     Signed, Robyn Haber, MD

## 2013-11-12 ENCOUNTER — Ambulatory Visit: Payer: Medicare Other

## 2013-11-12 ENCOUNTER — Ambulatory Visit (INDEPENDENT_AMBULATORY_CARE_PROVIDER_SITE_OTHER): Payer: Medicare Other | Admitting: Family Medicine

## 2013-11-12 VITALS — BP 140/90 | HR 83 | Temp 98.5°F | Resp 18 | Wt 173.4 lb

## 2013-11-12 DIAGNOSIS — R9389 Abnormal findings on diagnostic imaging of other specified body structures: Secondary | ICD-10-CM | POA: Diagnosis not present

## 2013-11-12 DIAGNOSIS — D72829 Elevated white blood cell count, unspecified: Secondary | ICD-10-CM

## 2013-11-12 DIAGNOSIS — R82998 Other abnormal findings in urine: Secondary | ICD-10-CM | POA: Diagnosis not present

## 2013-11-12 DIAGNOSIS — R21 Rash and other nonspecific skin eruption: Secondary | ICD-10-CM

## 2013-11-12 DIAGNOSIS — R8281 Pyuria: Secondary | ICD-10-CM

## 2013-11-12 DIAGNOSIS — R0602 Shortness of breath: Secondary | ICD-10-CM | POA: Diagnosis not present

## 2013-11-12 LAB — POCT URINALYSIS DIPSTICK
BILIRUBIN UA: NEGATIVE
GLUCOSE UA: NEGATIVE
Ketones, UA: NEGATIVE
LEUKOCYTES UA: NEGATIVE
NITRITE UA: NEGATIVE
Protein, UA: 30
Spec Grav, UA: 1.015
UROBILINOGEN UA: 0.2
pH, UA: 7.5

## 2013-11-12 LAB — POCT CBC
Granulocyte percent: 63.7 %G (ref 37–80)
HCT, POC: 45.5 % (ref 37.7–47.9)
Hemoglobin: 14.2 g/dL (ref 12.2–16.2)
Lymph, poc: 2.3 (ref 0.6–3.4)
MCH, POC: 27.9 pg (ref 27–31.2)
MCHC: 31.2 g/dL — AB (ref 31.8–35.4)
MCV: 89.4 fL (ref 80–97)
MID (cbc): 0.4 (ref 0–0.9)
MPV: 8 fL (ref 0–99.8)
POC Granulocyte: 4.8 (ref 2–6.9)
POC LYMPH PERCENT: 30.3 %L (ref 10–50)
POC MID %: 6 % (ref 0–12)
Platelet Count, POC: 287 10*3/uL (ref 142–424)
RBC: 5.09 M/uL (ref 4.04–5.48)
RDW, POC: 14.5 %
WBC: 7.5 10*3/uL (ref 4.6–10.2)

## 2013-11-12 LAB — POCT UA - MICROSCOPIC ONLY
CRYSTALS, UR, HPF, POC: NEGATIVE
Casts, Ur, LPF, POC: NEGATIVE
Mucus, UA: NEGATIVE
Yeast, UA: NEGATIVE

## 2013-11-12 LAB — URINE CULTURE: Colony Count: 9000

## 2013-11-12 MED ORDER — NYSTATIN 100000 UNIT/GM EX POWD: CUTANEOUS | Status: DC

## 2013-11-12 NOTE — Progress Notes (Signed)
Chief Complaint:  Chief Complaint  Patient presents with  . recheck urine    HPI: Latoya [name] is a 68 y.o. female who is here for recheck of her Dizziness and nausea with ear pain and chills for the last several months but had gotten worse so she came in on Tuesday. She came in on Tuesday and saw Dr Joseph Art and was given cipro for possible UTI but urine cx was negative. She has allergy sxs as well but will see her allergist about that, she thought her sxs were all related to her allergies. . She  Has had a history of PNA in November.  She has had SOB. Currently no SOB or wheezes. She would like to get her chest xray rechecked since she had  PNA and was given 2 different abx for it. No leg swelling or calf pain.   Past Medical History  Diagnosis Date  . Asthma     extrinsic; moderate, persistant, nml spirometry 2010, nml CXR 1/08  . Pulmonary embolism     related to back surgery with prior coumadin use, now off  . DVT (deep venous thrombosis)   . PAF (paroxysmal atrial fibrillation)     not on coumadin therapy  . HLD (hyperlipidemia)   . CAD (coronary artery disease)     50% mid LAD, 80% ostial D1 and moderate 80% mid circ by vath 2003  . Antiphospholipid syndrome     with hypercoagulable state  . Coronary heart disease   . Drug allergy     heparin/lovenox  . Erosive gastritis 1994  . Anemia, iron deficiency   . HTN (hypertension)     [name] at [facility], manages pt.   . Complication of anesthesia     cardiac arrest- in OR, at age 7 (24)y.o. during Scalenotomy   . Anxiety   . Hypothyroidism   . Bladder troubles     REPORTS INFECTIONS ON OCCASION DUE TO URETHRA MEATUS STRICTURE AT BIRTH   . H/O hiatal hernia   . Spondylosis, lumbosacral     ARTHRITIS- OA   . Cancer     basal cell removed fr. L arm   . Blood dyscrasia   . Liver spot     cyst- - no biopsy, but told that its benign   . GERD (gastroesophageal reflux disease)    Past Surgical History    Procedure Laterality Date  . Back surgery      x12 cervical and lumbar thoracic spine surgery  . Total knee arthroplasty      left  . Cleft lip and palate repair      68 yo   . Hysterectomy    . Spina bifida repair      68 yo   . Cardiac catheterization      2005  . Joint replacement    . Tonsillectomy    . Breast surgery      all benign cysts x3   . Abdominal hysterectomy      partial abdominal -1998   History   Social History  . Marital Status: Married    Spouse Name: N/A    Number of Children: N/A  . Years of Education: N/A   Social History Main Topics  . Smoking status: Former Smoker    Quit date: 10/22/1972  . Smokeless tobacco: Never Used     Comment: no smoking   . Alcohol Use: No  . Drug Use: No  . Sexual Activity: None  Other Topics Concern  . None   Social History Narrative   Lives in [place] with husband; has had miscarriages but no children.    Disabled but exercises nearly every day (can only exericse in water - aerobics)   Takes opioids for pain relief; takes no herbal medications; has a heart healthy diet.    Family History  Problem Relation Age of Onset  . Coronary artery disease Sister     MI    Allergies  Allergen Reactions  . Hornet Venom Anaphylaxis  . Amoxicillin-Pot Clavulanate Diarrhea    "patient stated that she can take this medication"  . Enoxaparin Sodium     REACTION: thrombocytopenia  . Heparin     REACTION: thrombocytopenia  . Lactose Intolerance (Gi)   . Other     Sensitive to dye in Betadine & Chlorohexadine   . Pork-Derived Products   . Povidone     Sensitivity- but if its wiped off she is able to tolerate betadine   . Indomethacin Rash   Prior to Admission medications   Medication Sig Start Date End Date Taking? Authorizing Provider  albuterol (PROVENTIL) (2.5 MG/3ML) 0.083% nebulizer solution Take 2.5 mg by nebulization every 6 (six) hours as needed. For shortness of breath/wheezing   Yes Historical Provider, MD   aspirin 81 MG EC tablet Take 81 mg by mouth daily.    Yes Historical Provider, MD  atorvastatin (LIPITOR) 20 MG tablet Take 1 tablet (20 mg total) by mouth daily. 09/01/13  Yes Burnell Blanks, MD  azelastine (ASTELIN) 137 MCG/SPRAY nasal spray Place 1 spray into the nose 2 (two) times daily as needed. Use in each nostril as directed For allergies   Yes Historical Provider, MD  beclomethasone (QVAR) 80 MCG/ACT inhaler Inhale 2 puffs into the lungs 3 (three) times daily.   Yes Historical Provider, MD  Calcium Carbonate-Vitamin D 600-400 MG-UNIT per tablet Take 1 tablet by mouth 2 (two) times daily.     Yes Historical Provider, MD  ciprofloxacin (CIPRO) 500 MG tablet Take 1 tablet (500 mg total) by mouth 2 (two) times daily. 11/10/13  Yes Robyn Haber, MD  cyclobenzaprine (FLEXERIL) 10 MG tablet Take 10 mg by mouth 3 (three) times daily as needed. For severe muscle pain   Yes Historical Provider, MD  diazepam (VALIUM) 5 MG tablet Take 5 mg by mouth as needed.    Yes Historical Provider, MD  docusate sodium (COLACE) 100 MG capsule Take 200 mg by mouth 2 (two) times daily.    Yes Historical Provider, MD  EPINEPHrine (EPIPEN) 0.3 mg/0.3 mL DEVI Inject 0.3 mLs (0.3 mg total) into the muscle once. 04/12/13  Yes Christina P Rama, MD  esomeprazole (NEXIUM) 40 MG capsule Take 40 mg by mouth 2 (two) times daily.    Yes Historical Provider, MD  fentaNYL (DURAGESIC - DOSED MCG/HR) 100 MCG/HR Place 1 patch onto the skin every other day.    Yes Historical Provider, MD  ferrous sulfate (FEOSOL) 325 (65 FE) MG tablet Take 325 mg by mouth every other day.    Yes Historical Provider, MD  gabapentin (NEURONTIN) 600 MG tablet Take 900 mg by mouth at bedtime.    Yes Historical Provider, MD  Glucosamine HCl (GLUCOSAMINE PO) Take by mouth. Glucosamine ASU   Yes Historical Provider, MD  HYDROmorphone (DILAUDID) 4 MG tablet Take 4-8 mg by mouth every 4 (four) hours as needed. For pain   Yes Historical Provider, MD   levothyroxine (SYNTHROID, LEVOTHROID) 75 MCG tablet  Take 75 mcg by mouth daily before breakfast.    Yes Historical Provider, MD  lidocaine (LIDODERM) 5 % Place 1 patch onto the skin as needed. Remove & Discard patch within 12 hours or as directed by MD   Yes Historical Provider, MD  Loratadine-Pseudoephedrine (CLARITIN-D 12 HOUR PO) Take 1 tablet by mouth 2 (two) times daily.    Yes Historical Provider, MD  metaxalone (SKELAXIN) 800 MG tablet Take 800 mg by mouth every 8 (eight) hours as needed. For muscle pain   Yes Historical Provider, MD  metoprolol (LOPRESSOR) 50 MG tablet Take 1 tablet (50 mg total) by mouth 2 (two) times daily. 09/01/13  Yes Burnell Blanks, MD  montelukast (SINGULAIR) 10 MG tablet Take 10 mg by mouth at bedtime.    Yes Historical Provider, MD  Multiple Vitamin (MULTIVITAMIN WITH MINERALS) TABS Take 1 tablet by mouth daily.   Yes Historical Provider, MD  nitroGLYCERIN (NITROSTAT) 0.4 MG SL tablet Place 1 tablet (0.4 mg total) under the tongue every 5 (five) minutes as needed. For chest pain 09/01/13  Yes Burnell Blanks, MD  sucralfate (CARAFATE) 1 G tablet Take 1 tablet (1 g total) by mouth 4 (four) times daily. 08/06/13  Yes Shawnee Knapp, MD  triamcinolone (NASACORT) 55 MCG/ACT nasal inhaler Place 2 sprays into the nose 2 (two) times daily.     Historical Provider, MD     ROS: The patient denies fevers,night sweats, unintentional weight loss, chest pain, palpitations, wheezing, dyspnea on exertion, nausea, vomiting, abdominal pain, dysuria, hematuria, melena, numbness, weakness, or tingling.   All other systems have been reviewed and were otherwise negative with the exception of those mentioned in the HPI and as above.    PHYSICAL EXAM: Filed Vitals:   11/12/13 1743  BP: 140/90  Pulse: 83  Temp: 98.5 F (36.9 C)  Resp: 18   Filed Vitals:   11/12/13 1743  Weight: 173 lb 6.4 oz (78.654 kg)   Body mass index is 31.19 kg/(m^2).  General: Alert, no  acute distress HEENT:  Normocephalic, atraumatic, oropharynx patent. EOMI, PERRLA, tm nl , no exudates Cardiovascular:  Regular rate and rhythm, no rubs murmurs or gallops.   radial pulse intact. No pedal edema.  Respiratory: Clear to auscultation bilaterally.  No wheezes, rales, or rhonchi.  No cyanosis, no use of accessory musculature GI: No organomegaly, abdomen is soft and non-tender, positive bowel sounds.  No masses. Skin: + yeast like rashes. underneath  abd pannus Neurologic: Facial musculature symmetric. Psychiatric: Patient is appropriate throughout our interaction. Lymphatic: No cervical lymphadenopathy Musculoskeletal: Ambulates using walker, kyphotic, she is actually pretty steady on her feet when not moving.    LABS: Results for orders placed in visit on 11/12/13  POCT URINALYSIS DIPSTICK      Result Value Range   Color, UA yellow     Clarity, UA clear     Glucose, UA neg     Bilirubin, UA neg     Ketones, UA neg     Spec Grav, UA 1.015     Blood, UA trace     pH, UA 7.5     Protein, UA 30     Urobilinogen, UA 0.2     Nitrite, UA neg     Leukocytes, UA Negative    POCT UA - MICROSCOPIC ONLY      Result Value Range   WBC, Ur, HPF, POC 0-1     RBC, urine, microscopic 0-2     Bacteria,  U Microscopic trace     Mucus, UA neg     Epithelial cells, urine per micros 0-2     Crystals, Ur, HPF, POC neg     Casts, Ur, LPF, POC neg     Yeast, UA neg    POCT CBC      Result Value Range   WBC 7.5  4.6 - 10.2 K/uL   Lymph, poc 2.3  0.6 - 3.4   POC LYMPH PERCENT 30.3  10 - 50 %L   MID (cbc) 0.4  0 - 0.9   POC MID % 6.0  0 - 12 %M   POC Granulocyte 4.8  2 - 6.9   Granulocyte percent 63.7  37 - 80 %G   RBC 5.09  4.04 - 5.48 M/uL   Hemoglobin 14.2  12.2 - 16.2 g/dL   HCT, POC 45.5  37.7 - 47.9 %   MCV 89.4  80 - 97 fL   MCH, POC 27.9  27 - 31.2 pg   MCHC 31.2 (*) 31.8 - 35.4 g/dL   RDW, POC 14.5     Platelet Count, POC 287  142 - 424 K/uL   MPV 8.0  0 - 99.8 fL      EKG/XRAY:   Primary read interpreted by Dr. Marin Comment at [facility]. No acute cardiopulmonary process + scarring, she has nondisplaced plates and screws from back and neck surgery    ASSESSMENT/PLAN: Encounter Diagnoses  Name Primary?  . Pyuria Yes  . SOB (shortness of breath)   . Leukocytosis   . Abnormal x-ray   . Rash and nonspecific skin eruption    Chest xray is scarring and CBC is back to normal, Urine is normal She is feeling better Continue with cipro I will rx her nystatin pwder prn use for candida under abd pannus F/u prn  Gross sideeffects, risk and benefits, and alternatives of medications d/w patient. Patient is aware that all medications have potential sideeffects and we are unable to predict every sideeffect or drug-drug interaction that may occur.  LE, Cashion, DO 11/12/2013 7:13 PM

## 2013-11-20 DIAGNOSIS — M47817 Spondylosis without myelopathy or radiculopathy, lumbosacral region: Secondary | ICD-10-CM | POA: Diagnosis not present

## 2013-11-20 DIAGNOSIS — M961 Postlaminectomy syndrome, not elsewhere classified: Secondary | ICD-10-CM | POA: Diagnosis not present

## 2013-11-20 DIAGNOSIS — G894 Chronic pain syndrome: Secondary | ICD-10-CM | POA: Diagnosis not present

## 2013-11-20 DIAGNOSIS — M5412 Radiculopathy, cervical region: Secondary | ICD-10-CM | POA: Diagnosis not present

## 2013-11-27 DIAGNOSIS — R7301 Impaired fasting glucose: Secondary | ICD-10-CM | POA: Diagnosis not present

## 2013-11-27 DIAGNOSIS — E785 Hyperlipidemia, unspecified: Secondary | ICD-10-CM | POA: Diagnosis not present

## 2013-11-27 DIAGNOSIS — D509 Iron deficiency anemia, unspecified: Secondary | ICD-10-CM | POA: Diagnosis not present

## 2013-11-27 DIAGNOSIS — E039 Hypothyroidism, unspecified: Secondary | ICD-10-CM | POA: Diagnosis not present

## 2013-11-27 DIAGNOSIS — I4891 Unspecified atrial fibrillation: Secondary | ICD-10-CM | POA: Diagnosis not present

## 2013-11-27 DIAGNOSIS — M899 Disorder of bone, unspecified: Secondary | ICD-10-CM | POA: Diagnosis not present

## 2013-12-07 ENCOUNTER — Other Ambulatory Visit (HOSPITAL_COMMUNITY): Payer: Self-pay | Admitting: Cardiology

## 2013-12-07 DIAGNOSIS — I6529 Occlusion and stenosis of unspecified carotid artery: Secondary | ICD-10-CM

## 2013-12-16 ENCOUNTER — Ambulatory Visit (HOSPITAL_COMMUNITY): Payer: Medicare Other | Attending: Cardiovascular Disease

## 2013-12-16 DIAGNOSIS — R0989 Other specified symptoms and signs involving the circulatory and respiratory systems: Secondary | ICD-10-CM | POA: Insufficient documentation

## 2013-12-16 DIAGNOSIS — Z87891 Personal history of nicotine dependence: Secondary | ICD-10-CM | POA: Diagnosis not present

## 2013-12-16 DIAGNOSIS — I658 Occlusion and stenosis of other precerebral arteries: Secondary | ICD-10-CM | POA: Insufficient documentation

## 2013-12-16 DIAGNOSIS — I10 Essential (primary) hypertension: Secondary | ICD-10-CM | POA: Insufficient documentation

## 2013-12-16 DIAGNOSIS — E785 Hyperlipidemia, unspecified: Secondary | ICD-10-CM | POA: Diagnosis not present

## 2013-12-16 DIAGNOSIS — I2510 Atherosclerotic heart disease of native coronary artery without angina pectoris: Secondary | ICD-10-CM | POA: Diagnosis not present

## 2013-12-16 DIAGNOSIS — I6529 Occlusion and stenosis of unspecified carotid artery: Secondary | ICD-10-CM | POA: Diagnosis not present

## 2013-12-16 DIAGNOSIS — I739 Peripheral vascular disease, unspecified: Secondary | ICD-10-CM | POA: Insufficient documentation

## 2013-12-21 ENCOUNTER — Encounter: Payer: Self-pay | Admitting: Cardiovascular Disease

## 2013-12-21 ENCOUNTER — Ambulatory Visit (INDEPENDENT_AMBULATORY_CARE_PROVIDER_SITE_OTHER): Payer: Medicare Other | Admitting: Cardiovascular Disease

## 2013-12-21 VITALS — BP 130/70 | HR 72 | Resp 12 | Ht 62.5 in | Wt 172.0 lb

## 2013-12-21 DIAGNOSIS — I6529 Occlusion and stenosis of unspecified carotid artery: Secondary | ICD-10-CM | POA: Diagnosis not present

## 2013-12-21 DIAGNOSIS — I6523 Occlusion and stenosis of bilateral carotid arteries: Secondary | ICD-10-CM

## 2013-12-21 DIAGNOSIS — I4891 Unspecified atrial fibrillation: Secondary | ICD-10-CM

## 2013-12-21 DIAGNOSIS — I480 Paroxysmal atrial fibrillation: Secondary | ICD-10-CM

## 2013-12-21 DIAGNOSIS — I2510 Atherosclerotic heart disease of native coronary artery without angina pectoris: Secondary | ICD-10-CM

## 2013-12-21 DIAGNOSIS — M5412 Radiculopathy, cervical region: Secondary | ICD-10-CM | POA: Diagnosis not present

## 2013-12-21 DIAGNOSIS — E785 Hyperlipidemia, unspecified: Secondary | ICD-10-CM

## 2013-12-21 DIAGNOSIS — I658 Occlusion and stenosis of other precerebral arteries: Secondary | ICD-10-CM

## 2013-12-21 DIAGNOSIS — I10 Essential (primary) hypertension: Secondary | ICD-10-CM | POA: Diagnosis not present

## 2013-12-21 DIAGNOSIS — M961 Postlaminectomy syndrome, not elsewhere classified: Secondary | ICD-10-CM | POA: Diagnosis not present

## 2013-12-21 DIAGNOSIS — Z79899 Other long term (current) drug therapy: Secondary | ICD-10-CM | POA: Diagnosis not present

## 2013-12-21 DIAGNOSIS — G894 Chronic pain syndrome: Secondary | ICD-10-CM | POA: Diagnosis not present

## 2013-12-21 MED ORDER — METOPROLOL TARTRATE 50 MG PO TABS: 50.0000 mg | ORAL_TABLET | Freq: Two times a day (BID) | ORAL | Status: DC

## 2013-12-21 MED ORDER — ATORVASTATIN CALCIUM 20 MG PO TABS: 20.0000 mg | ORAL_TABLET | Freq: Every day | ORAL | Status: DC

## 2013-12-21 NOTE — Patient Instructions (Addendum)
Your physician recommends that you schedule a follow-up appointment in: 3-4 months. --April 09, 2014 at 1:45

## 2013-12-21 NOTE — Progress Notes (Signed)
History of Present Illness: 68 yo female with HTN, hyperlipidemia, anti-phospholipid antibody syndrome, GERD, asthma, CAD s/p cath 2003 with 30% ostial LM stenosis, 50% mid LAD, 80% moderate sized Diagonal, 80% Circumflex. There was discussion about bypass at that time but medical management was pursued and she has done well with this plan over the years. She also has chronic back pain and paroxysmal atrial fibrillation. She has been previously followed by Dr. Johnsie Cancel in our office. She has a hypercoagulable state but has not been managed on coumadin because of prior intolerance. She has allergies to heparin (anaphylaxis) and Lovenox. Her coumadin had been stopped in the past because of trouble regulating her dose. She is seen by a hematologist at [facility] (Dr. Donney Dice) and he has told her that she does not need coumadin for her hypercoagulable state. We discussed Xarelto or Eliquis for PAF but she did not wish to start. She had been on amiodarone in the past but this was stopped in August 2010 by Dr. Johnsie Cancel. She has been diagnosed with hypothyroidism and has been on Synthroid. She has had no lower extremity edema but she does have chronic problems with pain in her feet from Raynaud's phenomenon and ongoing sciatica. Most recent stress test October 2013 with no ischemia, normal LV function.  Last echo June 2014 with normal LV size, mild LVH, normal LV systolic function with EF of 55%, mild MR with mildly thickened mitral valve leaflets. Carotid artery dopplers 12/16/13 with stable 40-59% bilateral stenosis.   She tells me today that she has been feeling well. She has had no chest pain. Her breathing has been ok. BP has been well controlled at home. She moved to [facility] November 2014 but did not like it and moved back to [facility].   Primary Care Physician: Trilby Drummer   Last Lipid Profile: Followed in primary care.   Past Medical History  Diagnosis Date  . Asthma     extrinsic; moderate, persistant, nml  spirometry 2010, nml CXR 1/08  . Pulmonary embolism     related to back surgery with prior coumadin use, now off  . DVT (deep venous thrombosis)   . PAF (paroxysmal atrial fibrillation)     not on coumadin therapy  . HLD (hyperlipidemia)   . CAD (coronary artery disease)     50% mid LAD, 80% ostial D1 and moderate 80% mid circ by vath 2003  . Antiphospholipid syndrome     with hypercoagulable state  . Coronary heart disease   . Drug allergy     heparin/lovenox  . Erosive gastritis 1994  . Anemia, iron deficiency   . HTN (hypertension)     [name] at [facility], manages pt.   . Complication of anesthesia     cardiac arrest- in OR, at age 41 (11)y.o. during Scalenotomy   . Anxiety   . Hypothyroidism   . Bladder troubles     REPORTS INFECTIONS ON OCCASION DUE TO URETHRA MEATUS STRICTURE AT BIRTH   . H/O hiatal hernia   . Spondylosis, lumbosacral     ARTHRITIS- OA   . Cancer     basal cell removed fr. L arm   . Blood dyscrasia   . Liver spot     cyst- - no biopsy, but told that its benign   . GERD (gastroesophageal reflux disease)     Past Surgical History  Procedure Laterality Date  . Back surgery      x12 cervical and lumbar thoracic spine surgery  .  Total knee arthroplasty      left  . Cleft lip and palate repair      68 yo   . Hysterectomy    . Spina bifida repair      68 yo   . Cardiac catheterization      2005  . Joint replacement    . Tonsillectomy    . Breast surgery      all benign cysts x3   . Abdominal hysterectomy      partial abdominal -1998    Current Outpatient Prescriptions  Medication Sig Dispense Refill  . albuterol (PROVENTIL) (2.5 MG/3ML) 0.083% nebulizer solution Take 2.5 mg by nebulization every 6 (six) hours as needed. For shortness of breath/wheezing      . aspirin 81 MG EC tablet Take 81 mg by mouth daily.       Marland Kitchen atorvastatin (LIPITOR) 20 MG tablet Take 1 tablet (20 mg total) by mouth daily.  30 tablet  3  . azelastine (ASTELIN) 137  MCG/SPRAY nasal spray Place 1 spray into the nose 2 (two) times daily as needed. Use in each nostril as directed For allergies      . beclomethasone (QVAR) 80 MCG/ACT inhaler Inhale 2 puffs into the lungs 3 (three) times daily.      . Calcium Carbonate-Vitamin D 600-400 MG-UNIT per tablet Take 1 tablet by mouth 2 (two) times daily.        . ciprofloxacin (CIPRO) 500 MG tablet Take 1 tablet (500 mg total) by mouth 2 (two) times daily.  20 tablet  0  . cyclobenzaprine (FLEXERIL) 10 MG tablet Take 10 mg by mouth 3 (three) times daily as needed. For severe muscle pain      . diazepam (VALIUM) 5 MG tablet Take 5 mg by mouth as needed.       . docusate sodium (COLACE) 100 MG capsule Take 200 mg by mouth 2 (two) times daily.       Marland Kitchen EPINEPHrine (EPIPEN) 0.3 mg/0.3 mL DEVI Inject 0.3 mLs (0.3 mg total) into the muscle once.  1 Device  2  . esomeprazole (NEXIUM) 40 MG capsule Take 40 mg by mouth 2 (two) times daily.       . fentaNYL (DURAGESIC - DOSED MCG/HR) 100 MCG/HR Place 1 patch onto the skin every other day.       . ferrous sulfate (FEOSOL) 325 (65 FE) MG tablet Take 325 mg by mouth every other day.       . gabapentin (NEURONTIN) 600 MG tablet Take 900 mg by mouth at bedtime.       . Glucosamine HCl (GLUCOSAMINE PO) Take by mouth. Glucosamine ASU      . HYDROmorphone (DILAUDID) 4 MG tablet Take 4-8 mg by mouth every 4 (four) hours as needed. For pain      . levothyroxine (SYNTHROID, LEVOTHROID) 75 MCG tablet Take 75 mcg by mouth daily before breakfast.       . lidocaine (LIDODERM) 5 % Place 1 patch onto the skin as needed. Remove & Discard patch within 12 hours or as directed by MD      . Loratadine-Pseudoephedrine (CLARITIN-D 12 HOUR PO) Take 1 tablet by mouth 2 (two) times daily.       . metaxalone (SKELAXIN) 800 MG tablet Take 800 mg by mouth every 8 (eight) hours as needed. For muscle pain      . metoprolol (LOPRESSOR) 50 MG tablet Take 1 tablet (50 mg total) by mouth 2 (two) times daily.  60  tablet  3  . montelukast (SINGULAIR) 10 MG tablet Take 10 mg by mouth at bedtime.       . Multiple Vitamin (MULTIVITAMIN WITH MINERALS) TABS Take 1 tablet by mouth daily.      . nitroGLYCERIN (NITROSTAT) 0.4 MG SL tablet Place 1 tablet (0.4 mg total) under the tongue every 5 (five) minutes as needed. For chest pain  25 tablet  3  . nystatin (MYCOSTATIN/NYSTOP) 100000 UNIT/GM POWD Use as directed, apply to affected area BID  60 g  0  . triamcinolone (NASACORT) 55 MCG/ACT nasal inhaler Place 2 sprays into the nose 2 (two) times daily.        No current facility-administered medications for this visit.    Allergies  Allergen Reactions  . Hornet Venom Anaphylaxis  . Amoxicillin-Pot Clavulanate Diarrhea    "patient stated that she can take this medication"  . Enoxaparin Sodium     REACTION: thrombocytopenia  . Heparin     REACTION: thrombocytopenia  . Lactose Intolerance (Gi)   . Other     Sensitive to dye in Betadine & Chlorohexadine   . Pork-Derived Products   . Povidone-Iodine     Sensitivity- but if its wiped off she is able to tolerate betadine   . Indomethacin Rash    History   Social History  . Marital Status: Married    Spouse Name: N/A    Number of Children: N/A  . Years of Education: N/A   Occupational History  . Not on file.   Social History Main Topics  . Smoking status: Former Smoker    Quit date: 10/22/1972  . Smokeless tobacco: Never Used     Comment: no smoking   . Alcohol Use: No  . Drug Use: No  . Sexual Activity: Not on file   Other Topics Concern  . Not on file   Social History Narrative   Lives in [place] with husband; has had miscarriages but no children.    Disabled but exercises nearly every day (can only exericse in water - aerobics)   Takes opioids for pain relief; takes no herbal medications; has a heart healthy diet.     Family History  Problem Relation Age of Onset  . Coronary artery disease Sister     MI     Review of Systems:  As  stated in the HPI and otherwise negative.   BP 158/100  Pulse 72  Ht 5' 2.5" (1.588 m)  Wt 172 lb (78.019 kg)  BMI 30.94 kg/m2  Physical Examination: General: Well developed, well nourished, NAD HEENT: OP clear, mucus membranes moist SKIN: warm, dry. No rashes. Neuro: No focal deficits Musculoskeletal: Muscle strength 5/5 all ext Psychiatric: Mood and affect normal Neck: No JVD, no carotid bruits, no thyromegaly, no lymphadenopathy. Lungs:Clear bilaterally, no wheezes, rhonci, crackles Cardiovascular: Regular rate and rhythm. No murmurs, gallops or rubs. Abdomen:Soft. Bowel sounds present. Non-tender.  Extremities: No lower extremity edema. Pulses are trace in the bilateral DP/PT.    Echo 04/12/13: Left ventricle: The cavity size was normal. Systolic function was normal. The estimated ejection fraction was in the range of 50% to 55%.   ABI: Normal June 2014  Carotid artery dopplers: February 2015: Moderate bilateral stenosis, 40-59% stenosis bilateral ICA  Stress myoview: 08/13/12: Stress Procedure: The patient received IV Lexiscan 0.4 mg over 15-seconds. Technetium 29m Sestamibi injected at 30-seconds. There were significant changes with Lexiscan. The patient complained of chest pain with Lexiscan. Quantitative spect images were  obtained after a 45 minute delay.  The patient complained of persistent headache with flushing of face and neck after Lexiscan that was relieved quickly after Aminophylline 75 mg IVP given.  Stress ECG: No significant change from baseline ECG  QPS  Raw Data Images: Patient motion noted.  Stress Images: lateral wall not interpretable due to bowel artifact  Rest Images: lateral wall not interpretable due to bowel artifact  Subtraction (SDS): Normal  Transient Ischemic Dilatation (Normal <1.22): 1.03  Lung/Heart Ratio (Normal <0.45): 0.45  Quantitative Gated Spect Images  QGS EDV: 95 ml  QGS ESV: 50 ml  Impression  Exercise Capacity: Lexiscan with  no exercise.  BP Response: Normal blood pressure response.  Clinical Symptoms: Headache requiring amynophylline  ECG Impression: No significant ST segment change suggestive of ischemia.  Comparison with Prior Nuclear Study: No images to compare  Overall Impression: Apical thinning no obvious ischemia Lateral wall not interpretable due to a large area of bowel artifact on both resting and stress images  LV Ejection Fraction: 47%. LV Wall Motion: Apical hypokinesis  Cardiac cath 09/22/02: 1. Ventriculography was performed in the RAO projection. Overall ejection  fraction was 70%. No segmental wall motion abnormalities were  identified.  2. The left main coronary artery has, what appears to be, about 30%  narrowing at the ostium. In most views there does appear to be a patent  ostium; however, some tapered narrowing cannot be excluded.  3. The LAD proper has about 40-50% narrowing, at most, in the mid portion  beyond the diagonal takeoff. The vessel is calcified. The diagonal  takeoff itself has a long 80% stenosis at the ostium extending out into  the mid portion of the diagonal. The distal diagonal does appear to be  suitable for grafting.  4. The large circumflex has about 70-80% focal narrowing prior to the  bifurcation of the marginal.  5. The right coronary artery has some mild luminal irregularities but no  high-grade stenoses.  IMPRESSION:  1. Coronary artery disease with significant involvement and circumflex  involvement.  2. Multiple medical issues, as described above.  Assessment and Plan:   1. CAD: Stress test 2013 without ischemia. No chest pain. She is known to have moderate CAD. Continue current medical management   2. PAROXYSMAL ATRIAL FIBRILLATION: Occasional palpitations. She has not been on coumadin because she refuses due to prior issues regulating levels. I have once again discussed one of the newer agents and she will consider. She will consider Xarelto or Eliquis  in the future and will call if she decides to try this.   3. HYPERTENSION: BP is controlled today.  No changes  4. CAROTID ARTERY DISEASE: Stable by dopplers 2/15. No changes. Repeat Feburary 2016  5. Bilateral foot pain: LE arterial dopplers June 2014 with normal ABI, no evidence of obstructive lower extremity arterial disease. Likely related to Raynaud's.   6. Hyperlipidemia: Continue statin. Lipids followed in primary care.

## 2013-12-23 ENCOUNTER — Ambulatory Visit: Payer: Medicare Other | Admitting: Cardiovascular Disease

## 2013-12-23 ENCOUNTER — Encounter (HOSPITAL_COMMUNITY): Payer: Medicare Other

## 2014-01-11 DIAGNOSIS — Z1231 Encounter for screening mammogram for malignant neoplasm of breast: Secondary | ICD-10-CM | POA: Diagnosis not present

## 2014-02-08 ENCOUNTER — Ambulatory Visit: Payer: Medicare Other

## 2014-02-08 ENCOUNTER — Ambulatory Visit (INDEPENDENT_AMBULATORY_CARE_PROVIDER_SITE_OTHER): Payer: Medicare Other | Admitting: Internal Medicine

## 2014-02-08 VITALS — BP 112/66 | HR 84 | Temp 98.7°F | Resp 18 | Ht <= 58 in | Wt 175.6 lb

## 2014-02-08 DIAGNOSIS — N390 Urinary tract infection, site not specified: Secondary | ICD-10-CM | POA: Diagnosis not present

## 2014-02-08 DIAGNOSIS — J159 Unspecified bacterial pneumonia: Secondary | ICD-10-CM | POA: Diagnosis not present

## 2014-02-08 DIAGNOSIS — D72829 Elevated white blood cell count, unspecified: Secondary | ICD-10-CM

## 2014-02-08 DIAGNOSIS — R42 Dizziness and giddiness: Secondary | ICD-10-CM

## 2014-02-08 LAB — POCT URINALYSIS DIPSTICK
Bilirubin, UA: NEGATIVE
Glucose, UA: NEGATIVE
KETONES UA: NEGATIVE
Leukocytes, UA: NEGATIVE
Nitrite, UA: NEGATIVE
Protein, UA: NEGATIVE
SPEC GRAV UA: 1.015
Urobilinogen, UA: 0.2
pH, UA: 5

## 2014-02-08 LAB — POCT CBC
GRANULOCYTE PERCENT: 91.4 % — AB (ref 37–80)
HEMATOCRIT: 42.6 % (ref 37.7–47.9)
Hemoglobin: 13.6 g/dL (ref 12.2–16.2)
Lymph, poc: 1.3 (ref 0.6–3.4)
MCH: 28.1 pg (ref 27–31.2)
MCHC: 31.9 g/dL (ref 31.8–35.4)
MCV: 88.1 fL (ref 80–97)
MID (cbc): 1.2 — AB (ref 0–0.9)
MPV: 8 fL (ref 0–99.8)
POC Granulocyte: 25.8 — AB (ref 2–6.9)
POC LYMPH %: 4.5 % — AB (ref 10–50)
POC MID %: 4.1 %M (ref 0–12)
Platelet Count, POC: 257 10*3/uL (ref 142–424)
RBC: 4.84 M/uL (ref 4.04–5.48)
RDW, POC: 14.6 %
WBC: 28.2 10*3/uL — AB (ref 4.6–10.2)

## 2014-02-08 LAB — POCT UA - MICROSCOPIC ONLY
Casts, Ur, LPF, POC: NEGATIVE
Crystals, Ur, HPF, POC: NEGATIVE
Mucus, UA: NEGATIVE
Yeast, UA: NEGATIVE

## 2014-02-08 MED ORDER — AZITHROMYCIN 500 MG PO TABS: 500.0000 mg | ORAL_TABLET | Freq: Every day | ORAL | Status: DC

## 2014-02-08 MED ORDER — CEFTRIAXONE SODIUM 1 G IJ SOLR
1.0000 g | Freq: Once | INTRAMUSCULAR | Status: AC
  Administered 2014-02-08: 1 g via INTRAMUSCULAR

## 2014-02-08 NOTE — Progress Notes (Signed)
Subjective:    Patient ID: Latoya [name], female    DOB: 1946-01-22, 68 y.o.   MRN: [id number]  HPI pt here c/o of vertigo when she woke up this morning, and also burning up she did take her temperature, otalgia and myalgias. She does have a history of seasonal allergies. otc tylenol also meclizine for fever and dizziness with some help. Patient does have a history of vertigo, and CAD which is stable.  This vertigo is a recurrent problem. Usually gets a shot of cortisone and meclizine for it. She may have a uti or chest sxs altho no cough. Her oximetry is low at 92%. No new sob and no cp.  Had pneumonia few months ago with WBC 19000.  Review of Systems     Objective:   Physical Exam  Vitals reviewed. Constitutional: She is oriented to person, place, and time. She appears well-developed and well-nourished. No distress.  HENT:  Head: Normocephalic.  Right Ear: External ear normal.  Left Ear: External ear normal.  Mouth/Throat: Oropharynx is clear and moist.  Eyes: EOM are normal.  Neck: Normal range of motion.  Cardiovascular: Normal rate, regular rhythm and normal heart sounds.   Pulmonary/Chest: Effort normal. She has wheezes. She has rales.  Neurological: She is alert and oriented to person, place, and time. She has normal reflexes. She displays no tremor. No cranial nerve deficit or sensory deficit. She exhibits normal muscle tone. Coordination and gait abnormal.  Psychiatric: She has a normal mood and affect. Her behavior is normal.  Rales 2/3 up right chest Repeat oximetry 98% at rest  Results for orders placed in visit on 02/08/14  POCT CBC      Result Value Ref Range   WBC 28.2 (*) 4.6 - 10.2 K/uL   Lymph, poc 1.3  0.6 - 3.4   POC LYMPH PERCENT 4.5 (*) 10 - 50 %L   MID (cbc) 1.2 (*) 0 - 0.9   POC MID % 4.1  0 - 12 %M   POC Granulocyte 25.8 (*) 2 - 6.9   Granulocyte percent 91.4 (*) 37 - 80 %G   RBC 4.84  4.04 - 5.48 M/uL   Hemoglobin 13.6  12.2 - 16.2 g/dL   HCT, POC  42.6  37.7 - 47.9 %   MCV 88.1  80 - 97 fL   MCH, POC 28.1  27 - 31.2 pg   MCHC 31.9  31.8 - 35.4 g/dL   RDW, POC 14.6     Platelet Count, POC 257  142 - 424 K/uL   MPV 8.0  0 - 99.8 fL  POCT URINALYSIS DIPSTICK      Result Value Ref Range   Color, UA yellow     Clarity, UA clear     Glucose, UA neg     Bilirubin, UA neg     Ketones, UA neg     Spec Grav, UA 1.015     Blood, UA small     pH, UA 5.0     Protein, UA neg     Urobilinogen, UA 0.2     Nitrite, UA neg     Leukocytes, UA Negative    POCT UA - MICROSCOPIC ONLY      Result Value Ref Range   WBC, Ur, HPF, POC 0-2     RBC, urine, microscopic 4-6     Bacteria, U Microscopic trace     Mucus, UA neg     Epithelial cells, urine per micros  1-3     Crystals, Ur, HPF, POC neg     Casts, Ur, LPF, POC neg     Yeast, UA neg     [facility] reading (PRIMARY) by  Dr Elder Cyphers infiltrate right middle lobe, maybe lower lobe         Assessment & Plan:  Vertigo Allergys/Chest congestion/Bacterial pneumonia  Rocephin 1 g/Repeat 1g tomorrow See Dr. Alyson Ingles Wednesday as scheduled To ER if worse

## 2014-02-08 NOTE — Patient Instructions (Addendum)
Benign Positional Vertigo Vertigo means you feel like you or your surroundings are moving when they are not. Benign positional vertigo is the most common form of vertigo. Benign means that the cause of your condition is not serious. Benign positional vertigo is more common in older adults. CAUSES  Benign positional vertigo is the result of an upset in the labyrinth system. This is an area in the middle ear that helps control your balance. This may be caused by a viral infection, head injury, or repetitive motion. However, often no specific cause is found. SYMPTOMS  Symptoms of benign positional vertigo occur when you move your head or eyes in different directions. Some of the symptoms may include:  Loss of balance and falls.  Vomiting.  Blurred vision.  Dizziness.  Nausea.  Involuntary eye movements (nystagmus). DIAGNOSIS  Benign positional vertigo is usually diagnosed by physical exam. If the specific cause of your benign positional vertigo is unknown, your caregiver may perform imaging tests, such as magnetic resonance imaging (MRI) or computed tomography (CT). TREATMENT  Your caregiver may recommend movements or procedures to correct the benign positional vertigo. Medicines such as meclizine, benzodiazepines, and medicines for nausea may be used to treat your symptoms. In rare cases, if your symptoms are caused by certain conditions that affect the inner ear, you may need surgery. HOME CARE INSTRUCTIONS   Follow your caregiver's instructions.  Move slowly. Do not make sudden body or head movements.  Avoid driving.  Avoid operating heavy machinery.  Avoid performing any tasks that would be dangerous to you or others during a vertigo episode.  Drink enough fluids to keep your urine clear or pale yellow. SEEK IMMEDIATE MEDICAL CARE IF:   You develop problems with walking, weakness, numbness, or using your arms, hands, or legs.  You have difficulty speaking.  You develop  severe headaches.  Your nausea or vomiting continues or gets worse.  You develop visual changes.  Your family or friends notice any behavioral changes.  Your condition gets worse.  You have a fever.  You develop a stiff neck or sensitivity to light. MAKE SURE YOU:   Understand these instructions.  Will watch your condition.  Will get help right away if you are not doing well or get worse. Document Released: 07/16/2006 Document Revised: 12/31/2011 Document Reviewed: 09/06/2012Pneumonia, Adult Pneumonia is an infection of the lungs.  CAUSES Pneumonia may be caused by bacteria or a virus. Usually, these infections are caused by breathing infectious particles into the lungs (respiratory tract). SYMPTOMS  Cough. Fever. Chest pain. Increased rate of breathing. Wheezing. Mucus production. DIAGNOSIS  If you have the common symptoms of pneumonia, your caregiver will typically confirm the diagnosis with a chest X-ray. The X-ray will show an abnormality in the lung (pulmonary infiltrate) if you have pneumonia. Other tests of your blood, urine, or sputum may be done to find the specific cause of your pneumonia. Your caregiver may also do tests (blood gases or pulse oximetry) to see how well your lungs are working. TREATMENT  Some forms of pneumonia may be spread to other people when you cough or sneeze. You may be asked to wear a mask before and during your exam. Pneumonia that is caused by bacteria is treated with antibiotic medicine. Pneumonia that is caused by the influenza virus may be treated with an antiviral medicine. Most other viral infections must run their course. These infections will not respond to antibiotics.  PREVENTION A pneumococcal shot (vaccine) is available to prevent a  common bacterial cause of pneumonia. This is usually suggested for: People over 3 years old. Patients on chemotherapy. People with chronic lung problems, such as bronchitis or emphysema. People with  immune system problems. If you are over 65 or have a high risk condition, you may receive the pneumococcal vaccine if you have not received it before. In some countries, a routine influenza vaccine is also recommended. This vaccine can help prevent some cases of pneumonia.You may be offered the influenza vaccine as part of your care. If you smoke, it is time to quit. You may receive instructions on how to stop smoking. Your caregiver can provide medicines and counseling to help you quit. HOME CARE INSTRUCTIONS  Cough suppressants may be used if you are losing too much rest. However, coughing protects you by clearing your lungs. You should avoid using cough suppressants if you can. Your caregiver may have prescribed medicine if he or she thinks your pneumonia is caused by a bacteria or influenza. Finish your medicine even if you start to feel better. Your caregiver may also prescribe an expectorant. This loosens the mucus to be coughed up. Only take over-the-counter or prescription medicines for pain, discomfort, or fever as directed by your caregiver. Do not smoke. Smoking is a common cause of bronchitis and can contribute to pneumonia. If you are a smoker and continue to smoke, your cough may last several weeks after your pneumonia has cleared. A cold steam vaporizer or humidifier in your room or home may help loosen mucus. Coughing is often worse at night. Sleeping in a semi-upright position in a recliner or using a couple pillows under your head will help with this. Get rest as you feel it is needed. Your body will usually let you know when you need to rest. SEEK IMMEDIATE MEDICAL CARE IF:  Your illness becomes worse. This is especially true if you are elderly or weakened from any other disease. You cannot control your cough with suppressants and are losing sleep. You begin coughing up blood. You develop pain which is getting worse or is uncontrolled with medicines. You have a fever. Any of the  symptoms which initially brought you in for treatment are getting worse rather than better. You develop shortness of breath or chest pain. MAKE SURE YOU:  Understand these instructions. Will watch your condition. Will get help right away if you are not doing well or get worse. Document Released: 10/08/2005 Document Revised: 12/31/2011 Document Reviewed: 12/28/2010 [facility] Patient Information 2014 [place], [place].  [employer] Patient Information 2014 [place].

## 2014-02-09 ENCOUNTER — Ambulatory Visit (INDEPENDENT_AMBULATORY_CARE_PROVIDER_SITE_OTHER): Payer: Medicare Other | Admitting: Internal Medicine

## 2014-02-09 ENCOUNTER — Encounter: Payer: Self-pay | Admitting: Internal Medicine

## 2014-02-09 VITALS — BP 140/78 | HR 80 | Temp 97.5°F | Resp 20

## 2014-02-09 DIAGNOSIS — R0602 Shortness of breath: Secondary | ICD-10-CM | POA: Diagnosis not present

## 2014-02-09 DIAGNOSIS — J189 Pneumonia, unspecified organism: Secondary | ICD-10-CM

## 2014-02-09 LAB — POCT CBC
GRANULOCYTE PERCENT: 88.7 % — AB (ref 37–80)
HCT, POC: 38 % (ref 37.7–47.9)
Hemoglobin: 12.3 g/dL (ref 12.2–16.2)
Lymph, poc: 1.6 (ref 0.6–3.4)
MCH, POC: 28.3 pg (ref 27–31.2)
MCHC: 32.4 g/dL (ref 31.8–35.4)
MCV: 87.4 fL (ref 80–97)
MID (CBC): 0.6 (ref 0–0.9)
MPV: 7.8 fL (ref 0–99.8)
PLATELET COUNT, POC: 255 10*3/uL (ref 142–424)
POC Granulocyte: 17.1 — AB (ref 2–6.9)
POC LYMPH %: 8.4 % — AB (ref 10–50)
POC MID %: 2.9 % (ref 0–12)
RBC: 4.35 M/uL (ref 4.04–5.48)
RDW, POC: 14.5 %
WBC: 19.3 10*3/uL — AB (ref 4.6–10.2)

## 2014-02-09 MED ORDER — CEFTRIAXONE SODIUM 1 G IJ SOLR
1.0000 g | Freq: Once | INTRAMUSCULAR | Status: AC
  Administered 2014-02-09: 1 g via INTRAMUSCULAR

## 2014-02-09 NOTE — Patient Instructions (Signed)
Aspiration Pneumonia °Aspiration pneumonia is an infection in your lungs. It occurs when food, liquid, or stomach contents (vomit) are inhaled (aspirated) into your lungs. When these things get into your lungs, swelling (inflammation) and infection can occur. This can make it difficult for you to breathe. Aspiration pneumonia is a serious condition and can be life threatening. °RISK FACTORS °Aspiration pneumonia is more likely to occur when a person's cough (gag) reflex or ability to swallow has been decreased. Some things that can do this include:  °· Having a brain injury or disease, such as stroke, seizures, Parkinson disease, dementia, or amyotrophic lateral sclerosis (ALS).   °· Being given general anesthetic for procedures.   °· Being in a coma (unconscious).   °· Having a narrowing of the tube that carries food to the stomach (esophagus).   °· Drinking too much alcohol. If a person passes out and vomits, vomit can be swallowed into the lungs.   °· Taking certain medicines, such as tranquilizers or sedatives.   °SIGNS AND SYMPTOMS  °· Coughing after swallowing food or liquids.   °· Breathing problems, such as wheezing or shortness of breath.   °· Bluish skin. This can be caused by lack of oxygen.   °· Coughing up food or mucus. The mucus might contain blood, greenish material, or yellowish-white fluid (pus).   °· Fever.   °· Chest pain.   °· Being more tired than usual (fatigue).   °· Sweating more than usual.   °· Bad breath.   °DIAGNOSIS  °A physical exam will be done. During the exam, the health care provider will listen to your lungs with a stethoscope to check for:  °· Crackling sounds in the lungs. °· Decreased breath sounds. °· A rapid heartbeat. °Various tests may be ordered. These may include:  °· Chest X-ray.   °· CT scan.   °· Swallowing study. This test looks at how food is swallowed and whether it goes into your breathing tube (trachea) or food pipe (esophagus).   °· Sputum culture. Saliva and  mucus (sputum) are collected from the lungs or the tubes that carry air to the lungs (bronchi). The sputum is then tested for bacteria.   °· Bronchoscopy. This test uses a flexible tube (bronchoscope) to see inside the lungs. °TREATMENT  °Treatment will usually include antibiotic medicines. Other medicines may also be used to reduce fever or pain. You may need to be treated in the hospital. In the hospital, your breathing will be carefully monitored. Depending on how well you are breathing, you may need to be given oxygen, or you may need breathing support from a breathing machine (ventilator). For people who fail a swallowing study, a feeding tube might be placed in the stomach, or they may be asked to avoid certain food textures or liquids when they eat. °HOME CARE INSTRUCTIONS  °· Carefully follow any special eating instructions you were given, such as avoiding certain food textures or thickening liquids. This reduces the risk of developing aspiration pneumonia again.  °· Only take over-the-counter or prescription medicines as directed by your health care provider. Follow the directions carefully.   °· If you were prescribed antibiotics, take them as directed. Finish them even if you start to feel better.   °· Rest as instructed by your health care provider.   °· Keep all follow-up appointments with your health care provider.   °SEEK MEDICAL CARE IF:  °· You develop worsening shortness of breath, wheezing, or difficulty breathing.   °· You develop a fever.   °· You have chest pain.   °MAKE SURE YOU:  °· Understand these instructions. °· Will watch your   condition. °· Will get help right away if you are not doing well or get worse. °Document Released: 08/05/2009 Document Revised: 06/10/2013 Document Reviewed: 03/26/2013 °[employer]® Patient Information ©2014 [employer]. ° °

## 2014-02-09 NOTE — Progress Notes (Signed)
   Subjective:    Patient ID: Latoya [name], female    DOB: 12/28/1945, 68 y.o.   MRN: [id number]  HPI Feels a little worse, mild sob. Radiology suggests possible aspiration as cause of repeat pneumonias. She sees her family doctor tomorrow. Feels hot and cold. States she does feel some better since last nite. She agrees to keep her appt tomorrow with Dr. Alyson Ingles.  Review of Systems     Objective:   Physical Exam  Constitutional: She is oriented to person, place, and time. She appears well-nourished. She is cooperative. She appears ill. No distress.  Mild tachypnea Oximetry 96%  HENT:  Head: Normocephalic.  Mouth/Throat: Oropharynx is clear and moist.  Eyes: EOM are normal. No scleral icterus.  Neck: Normal range of motion. Neck supple.  Cardiovascular: Normal rate, regular rhythm and normal heart sounds.   Pulmonary/Chest: No accessory muscle usage. Tachypnea noted. No respiratory distress. She has no decreased breath sounds. She has no wheezes. She has no rhonchi. She has rales.   She exhibits no tenderness.  Rales  Neurological: She is alert and oriented to person, place, and time. No sensory deficit. She exhibits abnormal muscle tone. Coordination and gait abnormal.  Psychiatric: She has a normal mood and affect. Her behavior is normal. Thought content normal.    Results for orders placed in visit on 02/09/14  POCT CBC      Result Value Ref Range   WBC 19.3 (*) 4.6 - 10.2 K/uL   Lymph, poc 1.6  0.6 - 3.4   POC LYMPH PERCENT 8.4 (*) 10 - 50 %L   MID (cbc) 0.6  0 - 0.9   POC MID % 2.9  0 - 12 %M   POC Granulocyte 17.1 (*) 2 - 6.9   Granulocyte percent 88.7 (*) 37 - 80 %G   RBC 4.35  4.04 - 5.48 M/uL   Hemoglobin 12.3  12.2 - 16.2 g/dL   HCT, POC 38.0  37.7 - 47.9 %   MCV 87.4  80 - 97 fL   MCH, POC 28.3  27 - 31.2 pg   MCHC 32.4  31.8 - 35.4 g/dL   RDW, POC 14.5     Platelet Count, POC 255  142 - 424 K/uL   MPV 7.8  0 - 99.8 fL   Rocephin 1 g      Assessment  & Plan:  Pneumonia/Recurrent See your doctor tomorrow

## 2014-02-10 ENCOUNTER — Telehealth: Payer: Self-pay | Admitting: Cardiovascular Disease

## 2014-02-10 DIAGNOSIS — E039 Hypothyroidism, unspecified: Secondary | ICD-10-CM | POA: Diagnosis not present

## 2014-02-10 DIAGNOSIS — R7301 Impaired fasting glucose: Secondary | ICD-10-CM | POA: Diagnosis not present

## 2014-02-10 DIAGNOSIS — E785 Hyperlipidemia, unspecified: Secondary | ICD-10-CM | POA: Diagnosis not present

## 2014-02-10 DIAGNOSIS — I2510 Atherosclerotic heart disease of native coronary artery without angina pectoris: Secondary | ICD-10-CM

## 2014-02-10 MED ORDER — METOPROLOL TARTRATE 25 MG PO TABS: 25.0000 mg | ORAL_TABLET | Freq: Two times a day (BID) | ORAL | Status: DC

## 2014-02-10 NOTE — Telephone Encounter (Signed)
New message     Pt has pneumonia.  Her doctor says take am dosage of metoprolol only for a while.  Will this be ok with Dr Angelena Form?

## 2014-02-10 NOTE — Telephone Encounter (Signed)
Would lower Lopressor to 25 mg BID instead of once daily since it is short acting form. chris

## 2014-02-10 NOTE — Telephone Encounter (Signed)
Spoke with pt's husband and gave him instructions from Dr. Angelena Form. Will send new prescription to [facility] on [address].

## 2014-02-10 NOTE — Telephone Encounter (Signed)
Spoke with pt's husband who reports pt saw MD on 4/20 (see note from Dr. Elder Cyphers) for dizziness. Also has pneumonia. Is to see primary MD (Dr. Alyson Ingles) this afternoon. Husband states at office visit on 4/20 it was suggested she reduce dose of metoprolol to once daily. Currently taking 50 mg twice daily.  Pt does not want to make change until reviewed by Dr. Angelena Form.  Husband reports pt continue to have dizziness and is taking meclizine as needed.

## 2014-02-11 ENCOUNTER — Telehealth: Payer: Self-pay | Admitting: Cardiovascular Disease

## 2014-02-11 DIAGNOSIS — I2510 Atherosclerotic heart disease of native coronary artery without angina pectoris: Secondary | ICD-10-CM

## 2014-02-11 MED ORDER — METOPROLOL TARTRATE 50 MG PO TABS: 50.0000 mg | ORAL_TABLET | Freq: Two times a day (BID) | ORAL | Status: DC

## 2014-02-11 NOTE — Telephone Encounter (Signed)
Spoke with pt who reports she saw primary care MD yesterday and he felt she should stay on metoprolol 50 mg by mouth daily. He felt dizziness was related to vertigo and allergies not blood pressure. She will resume 50 mg metoprolol tartrate twice daily as previously taking.  I told pt I would send new prescription for metoprolol tartrate 50 mg by mouth twice daily to [facility] on [address].

## 2014-02-11 NOTE — Telephone Encounter (Signed)
New message      FYI Pt went to urgent care tues---they cut back on her metoprolol.  Yesterday her PCP did not think she should cut it back.  She is leaving the metoprolol the way it was 50mg  bid.  She will get the 25mg  presc you called in and take 2 a day.

## 2014-02-15 DIAGNOSIS — M961 Postlaminectomy syndrome, not elsewhere classified: Secondary | ICD-10-CM | POA: Diagnosis not present

## 2014-02-15 DIAGNOSIS — Z79899 Other long term (current) drug therapy: Secondary | ICD-10-CM | POA: Diagnosis not present

## 2014-02-15 DIAGNOSIS — M5412 Radiculopathy, cervical region: Secondary | ICD-10-CM | POA: Diagnosis not present

## 2014-02-15 DIAGNOSIS — G894 Chronic pain syndrome: Secondary | ICD-10-CM | POA: Diagnosis not present

## 2014-02-22 ENCOUNTER — Ambulatory Visit (HOSPITAL_COMMUNITY)
Admission: RE | Admit: 2014-02-22 | Discharge: 2014-02-22 | Disposition: A | Discharge status: Home / Self Care | Payer: Medicare Other | Source: Ambulatory Visit | Attending: Family Medicine | Admitting: Family Medicine

## 2014-02-22 ENCOUNTER — Ambulatory Visit: Payer: Medicare Other

## 2014-02-22 ENCOUNTER — Ambulatory Visit (INDEPENDENT_AMBULATORY_CARE_PROVIDER_SITE_OTHER): Payer: Medicare Other | Admitting: Family Medicine

## 2014-02-22 VITALS — BP 122/70 | HR 97 | Temp 98.3°F | Resp 16 | Wt 179.4 lb

## 2014-02-22 DIAGNOSIS — R911 Solitary pulmonary nodule: Secondary | ICD-10-CM

## 2014-02-22 DIAGNOSIS — J90 Pleural effusion, not elsewhere classified: Secondary | ICD-10-CM | POA: Diagnosis not present

## 2014-02-22 DIAGNOSIS — D72829 Elevated white blood cell count, unspecified: Secondary | ICD-10-CM

## 2014-02-22 DIAGNOSIS — J690 Pneumonitis due to inhalation of food and vomit: Secondary | ICD-10-CM

## 2014-02-22 DIAGNOSIS — Z95811 Presence of heart assist device: Secondary | ICD-10-CM | POA: Diagnosis not present

## 2014-02-22 DIAGNOSIS — R918 Other nonspecific abnormal finding of lung field: Secondary | ICD-10-CM | POA: Diagnosis not present

## 2014-02-22 LAB — POCT CBC
GRANULOCYTE PERCENT: 86.6 % — AB (ref 37–80)
HCT, POC: 42.2 % (ref 37.7–47.9)
Hemoglobin: 13.7 g/dL (ref 12.2–16.2)
LYMPH, POC: 3 (ref 0.6–3.4)
MCH, POC: 28.4 pg (ref 27–31.2)
MCHC: 32.5 g/dL (ref 31.8–35.4)
MCV: 87.6 fL (ref 80–97)
MID (cbc): 1 — AB (ref 0–0.9)
MPV: 8.1 fL (ref 0–99.8)
PLATELET COUNT, POC: 266 10*3/uL (ref 142–424)
POC Granulocyte: 25.8 — AB (ref 2–6.9)
POC LYMPH PERCENT: 9.9 %L — AB (ref 10–50)
POC MID %: 3.5 % (ref 0–12)
RBC: 4.82 M/uL (ref 4.04–5.48)
RDW, POC: 13.8 %
WBC: 29.8 10*3/uL — AB (ref 4.6–10.2)

## 2014-02-22 MED ORDER — CEFTRIAXONE SODIUM 1 G IJ SOLR
1.0000 g | Freq: Once | INTRAMUSCULAR | Status: AC
  Administered 2014-02-22: 1 g via INTRAMUSCULAR

## 2014-02-22 MED ORDER — IOHEXOL 300 MG/ML  SOLN
80.0000 mL | Freq: Once | INTRAMUSCULAR | Status: AC | PRN
  Administered 2014-02-22: 80 mL via INTRAVENOUS

## 2014-02-22 MED ORDER — LEVOFLOXACIN 500 MG PO TABS: 500.0000 mg | ORAL_TABLET | Freq: Every day | ORAL | Status: DC

## 2014-02-22 NOTE — Progress Notes (Signed)
[facility] [address], [place] [address] [phone]  Date:  02/22/2014   She Name:  Latoya [name] [facility]   DOB:  04/22/1946   MRN:  [id number]  PCP:  Thressa Sheller, MD    Chief Complaint: Follow-up   History of Present Illness:  Latoya [name] is a 68 y.o. very pleasant female patient who presents with the following:  Here today to recheck on illness.  She was seen here on 4/20 with SOB and pneumonia,  She was treated with  Rocephin I gm twice, and po azithromycin She had the following CXR on 4/20: COMPARISON: DG CHEST 2V dated 11/12/2013; DG CHEST 2V dated  08/26/2013; DG RIBS W/ CHEST 3+V*R* dated 02/12/2013; CT ANGIO CHEST  W/CM &/OR WO/CM dated 02/13/2013 Next  FINDINGS:  Grossly unchanged cardiac silhouette and mediastinal contours.  Ill-defined heterogeneous opacities within the right mid and lower  lung have likely progressed in the interval. Trace right-sided  pleural effusion is not excluded. No new focal airspace opacities.  No evidence of edema. No pneumothorax. Grossly unchanged bones  including long segment thoracolumbar paraspinal fusion and lower  cervical ACDF, incompletely evaluated.  IMPRESSION:  Worsening though chronic right mid and lower lung heterogeneous  opacities - differential considerations include worsening  scar/atelectasis known acute on chronic infection is not excluded.  Additionally, the chronicity of these opacities could be indicative  of chronic silent aspiration. Clinical correlation is advised.  Further evaluation to be performed with dedicated speech swallow  examination as clinically indicated.  Electronically Signed  By: Sandi Mariscal M.D.  On: 02/09/2014 08:04      She is now feeling better.  Yesterday and today she did spit out some blood, but she was not sure if this came from her sinuses or lungs.  Today she is feeling a bit better,  Her cough is much improved.   She has not had a fever.    She had significant  leukocytosis at her last couple of visit.s   Patient Active Problem List   Diagnosis Date Noted  . Anaphylaxis, mild, due to wasp envenomation 04/12/2013  . Chest pain, atypical 04/11/2013  . PAD (peripheral artery disease) 06/20/2011  . ANEMIA, IRON DEFICIENCY, MICROCYTIC 06/01/2010  . ANEMIA-UNSPECIFIED 06/01/2010  . Primary hypercoagulable state 06/01/2010  . BACK PAIN, CHRONIC 06/01/2010  . ABDOMINAL PAIN -GENERALIZED 06/01/2010  . CAROTID ARTERY DISEASE 05/25/2010  . CAD, NATIVE VESSEL 11/15/2009  . MURMUR 11/15/2009  . CAROTID BRUIT, LEFT 11/15/2009  . HYPERTENSION 03/18/2009  . PULMONARY EMBOLISM 03/18/2009  . PAROXYSMAL ATRIAL FIBRILLATION 03/18/2009  . DVT 03/18/2009  . ASTHMA 03/18/2009  . GERD 03/18/2009  . SPONDYLOSIS, LUMBAR 03/18/2009  . HYPERLIPIDEMIA 11/29/2008  . CORONARY HEART DISEASE 11/29/2008  . EXTRINSIC ASTHMA, UNSPECIFIED 11/25/2008    Past Medical History  Diagnosis Date  . Asthma     extrinsic; moderate, persistant, nml spirometry 2010, nml CXR 1/08  . Pulmonary embolism     related to back surgery with prior coumadin use, now off  . DVT (deep venous thrombosis)   . PAF (paroxysmal atrial fibrillation)     not on coumadin therapy  . HLD (hyperlipidemia)   . CAD (coronary artery disease)     50% mid LAD, 80% ostial D1 and moderate 80% mid circ by vath 2003  . Antiphospholipid syndrome     with hypercoagulable state  . Coronary heart disease   . Drug allergy     heparin/lovenox  . Erosive  gastritis 1994  . Anemia, iron deficiency   . HTN (hypertension)     [name] at [facility], manages pt.   . Complication of anesthesia     cardiac arrest- in OR, at age 76 (70)y.o. during Scalenotomy   . Anxiety   . Hypothyroidism   . Bladder troubles     REPORTS INFECTIONS ON OCCASION DUE TO URETHRA MEATUS STRICTURE AT BIRTH   . H/O hiatal hernia   . Spondylosis, lumbosacral     ARTHRITIS- OA   . Cancer     basal cell removed fr. L arm   . Blood  dyscrasia   . Liver spot     cyst- - no biopsy, but told that its benign   . GERD (gastroesophageal reflux disease)     Past Surgical History  Procedure Laterality Date  . Back surgery      x12 cervical and lumbar thoracic spine surgery  . Total knee arthroplasty      left  . Cleft lip and palate repair      68 yo   . Hysterectomy    . Spina bifida repair      68 yo   . Cardiac catheterization      2005  . Joint replacement    . Tonsillectomy    . Breast surgery      all benign cysts x3   . Abdominal hysterectomy      partial abdominal -1998    History  Substance Use Topics  . Smoking status: Former Smoker    Quit date: 10/22/1972  . Smokeless tobacco: Never Used     Comment: no smoking   . Alcohol Use: No    Family History  Problem Relation Age of Onset  . Coronary artery disease Sister     MI     Allergies  Allergen Reactions  . Hornet Venom Anaphylaxis  . Amoxicillin-Pot Clavulanate Diarrhea    "patient stated that she can take this medication"  . Enoxaparin Sodium     REACTION: thrombocytopenia  . Heparin     REACTION: thrombocytopenia  . Lactose Intolerance (Gi)   . Other     Sensitive to dye in Betadine & Chlorohexadine   . Pork-Derived Products   . Povidone-Iodine     Sensitivity- but if its wiped off she is able to tolerate betadine   . Indomethacin Rash    Medication list has been reviewed and updated.  Current Outpatient Prescriptions on File Prior to Visit  Medication Sig Dispense Refill  . albuterol (PROVENTIL) (2.5 MG/3ML) 0.083% nebulizer solution Take 2.5 mg by nebulization every 6 (six) hours as needed. For shortness of breath/wheezing      . aspirin 81 MG EC tablet Take 81 mg by mouth daily.       Marland Kitchen atorvastatin (LIPITOR) 20 MG tablet Take 1 tablet (20 mg total) by mouth daily.  30 tablet  11  . azelastine (ASTELIN) 137 MCG/SPRAY nasal spray Place 1 spray into the nose 2 (two) times daily as needed. Use in each nostril as directed For  allergies      . azithromycin (ZITHROMAX) 500 MG tablet Take 1 tablet (500 mg total) by mouth daily.  5 tablet  0  . beclomethasone (QVAR) 80 MCG/ACT inhaler Inhale 2 puffs into the lungs 3 (three) times daily.      . Calcium Carbonate-Vitamin D 600-400 MG-UNIT per tablet Take 1 tablet by mouth 2 (two) times daily.        Marland Kitchen  cyclobenzaprine (FLEXERIL) 10 MG tablet Take 10 mg by mouth 3 (three) times daily as needed. For severe muscle pain      . diazepam (VALIUM) 5 MG tablet Take 5 mg by mouth as needed.       . docusate sodium (COLACE) 100 MG capsule Take 200 mg by mouth 2 (two) times daily.       Marland Kitchen EPINEPHrine (EPIPEN) 0.3 mg/0.3 mL DEVI Inject 0.3 mLs (0.3 mg total) into the muscle once.  1 Device  2  . esomeprazole (NEXIUM) 40 MG capsule Take 40 mg by mouth 2 (two) times daily.       . fentaNYL (DURAGESIC - DOSED MCG/HR) 100 MCG/HR Place 1 patch onto the skin every other day.       . gabapentin (NEURONTIN) 600 MG tablet Take 900 mg by mouth at bedtime.       . Glucosamine HCl (GLUCOSAMINE PO) Take by mouth. Glucosamine ASU      . HYDROmorphone (DILAUDID) 4 MG tablet Take 4-8 mg by mouth every 4 (four) hours as needed. For pain      . levothyroxine (SYNTHROID, LEVOTHROID) 75 MCG tablet Take 75 mcg by mouth daily before breakfast.       . lidocaine (LIDODERM) 5 % Place 1 patch onto the skin as needed. Remove & Discard patch within 12 hours or as directed by MD      . Loratadine-Pseudoephedrine (CLARITIN-D 12 HOUR PO) Take 1 tablet by mouth 2 (two) times daily.       . metaxalone (SKELAXIN) 800 MG tablet Take 800 mg by mouth every 8 (eight) hours as needed. For muscle pain      . metoprolol tartrate (LOPRESSOR) 50 MG tablet Take 1 tablet (50 mg total) by mouth 2 (two) times daily.  60 tablet  11  . montelukast (SINGULAIR) 10 MG tablet Take 10 mg by mouth at bedtime.       . Multiple Vitamin (MULTIVITAMIN WITH MINERALS) TABS Take 1 tablet by mouth daily.      . nitroGLYCERIN (NITROSTAT) 0.4 MG SL  tablet Place 1 tablet (0.4 mg total) under the tongue every 5 (five) minutes as needed. For chest pain  25 tablet  3  . nystatin (MYCOSTATIN/NYSTOP) 100000 UNIT/GM POWD Use as directed, apply to affected area BID  60 g  0  . triamcinolone (NASACORT) 55 MCG/ACT nasal inhaler Place 2 sprays into the nose 2 (two) times daily.        No current facility-administered medications on file prior to visit.    Review of Systems:  As per HPI- otherwise negative.   Physical Examination: Filed Vitals:   02/22/14 1512  BP: 122/70  Pulse: 97  Temp: 98.3 F (36.8 C)  Resp: 16   Filed Vitals:   02/22/14 1512  Weight: 179 lb 6.4 oz (81.375 kg)   Body mass index is 41.7 kg/(m^2). Ideal Body Weight:    GEN: WDWN, NAD, Non-toxic, A & O x 3, kyphosis, uses a walker HEENT: Atraumatic, Normocephalic. Neck supple. No masses, No LAD. Ears and Nose: No external deformity. CV: RRR, No M/G/R. No JVD. No thrill. No extra heart sounds. PULM: CTA B, no wheezes. No retractions. No resp. distress. No accessory muscle use.  She has crackles in the right lower lobe ABD: S, NT, ND, +BS. No rebound. No HSM. EXTR: No c/c/e NEURO Normal gait for pt- she has severe kyphosis and uses a rolling walker PSYCH: Normally interactive. Conversant. Not depressed or anxious appearing.  Calm demeanor.   Results for orders placed in visit on 02/22/14  POCT CBC      Result Value Ref Range   WBC 29.8 (*) 4.6 - 10.2 K/uL   Lymph, poc 3.0  0.6 - 3.4   POC LYMPH PERCENT 9.9 (*) 10 - 50 %L   MID (cbc) 1.0 (*) 0 - 0.9   POC MID % 3.5  0 - 12 %M   POC Granulocyte 25.8 (*) 2 - 6.9   Granulocyte percent 86.6 (*) 37 - 80 %G   RBC 4.82  4.04 - 5.48 M/uL   Hemoglobin 13.7  12.2 - 16.2 g/dL   HCT, POC 42.2  37.7 - 47.9 %   MCV 87.6  80 - 97 fL   MCH, POC 28.4  27 - 31.2 pg   MCHC 32.5  31.8 - 35.4 g/dL   RDW, POC 13.8     Platelet Count, POC 266  142 - 424 K/uL   MPV 8.1  0 - 99.8 fL   [facility] reading (PRIMARY) by  Dr.  Lorelei Pont. CXR: virtually unchanged from last film.  Persistent right sided patchy infiltrate.   CHEST 2 VIEW  COMPARISON: DG CHEST 2V dated 02/08/2014; DG CHEST 2V dated 11/12/2013; DG CHEST 2V dated 08/26/2013  FINDINGS: Trachea is midline. Heart size normal. Patchy airspace disease at the right lung base is unchanged from 02/08/2014 but appears worsened from 11/12/2013 and 08/26/2013. Image quality on the lateral view is degraded by motion. No definite pleural fluid. Postoperative changes are seen in the spine.  IMPRESSION: Persistent airspace opacification at the base the right hemi thorax, unchanged from 02/08/2014 and progressive or recurrent from 11/12/2013 and 08/26/2013. Consider chest CT with contrast in further evaluation, as underlying malignancy cannot be excluded. These results will be called to the ordering clinician or representative by the Radiologist Assistant, and communication documented in the PACS Dashboard.  Assessment and Plan: Leukocytosis, unspecified - Plan: POCT CBC, CT Chest W Contrast  Aspiration pneumonia - Plan: DG Chest 2 View, cefTRIAXone (ROCEPHIN) injection 1 g, CT Chest W Contrast  Lung nodule - Plan: CT Chest W Contrast  Recurrent leukocytosis and persistently abnormal CXR.  Will refer for a CT today and follow-up with results.  Rocephin today, will call them later today with results and to make plan.     Meds ordered this encounter  Medications  . cefTRIAXone (ROCEPHIN) injection 1 g    Sig:     Order Specific Question:  Antibiotic Indication:    Answer:  CAP    Signed Lamar Blinks, MD CT results:  Called to go over with her.  We are relieved that there is no apparent neoplasm.  Will plan to start levaquin tomorrow and recheck in 48 hours.  Will add levaquin 500 daily for 14 days  IMPRESSION: 1. Patchy parenchymal infiltrate with associated air bronchograms within the right upper and lower lobes, most compatible with pneumonia.  Possible recurrent pneumonia or aspiration could be considered given the relative persistence of these findings since 2014. 2. Small layering right pleural effusion. No loculated collection to suggest empyema identified. 3. Mediastinal and right hilar adenopathy as above, which may be reactive in nature. No obstructive mediastinal or hilar mass identified to explain persistent right lung pathology. A short interval follow-up in 6-8 weeks could be considered to ensure resolution of these findings. 4. Hypodense mass with peripheral nodular enhancement within the right hepatic lobe, partially visualized, but favored to reflect a hemangioma. Further evaluation  with targeted abdominal ultrasound could be performed for further evaluation. 5. Small hiatal hernia.

## 2014-02-22 NOTE — Patient Instructions (Addendum)
Please do be sure to see your GI doctor as planned and talk to him about doing a speech swallow study.    Please proceed to [facility]- register as an outpatient in the ER and let them know you are going to have an OUTPATIENT CT scan.  I will call you tonight with these results.

## 2014-02-23 ENCOUNTER — Other Ambulatory Visit: Payer: Self-pay

## 2014-02-23 DIAGNOSIS — E785 Hyperlipidemia, unspecified: Secondary | ICD-10-CM

## 2014-02-23 DIAGNOSIS — I2510 Atherosclerotic heart disease of native coronary artery without angina pectoris: Secondary | ICD-10-CM

## 2014-02-23 MED ORDER — ATORVASTATIN CALCIUM 20 MG PO TABS: 20.0000 mg | ORAL_TABLET | Freq: Every day | ORAL | Status: DC

## 2014-02-23 MED ORDER — METOPROLOL TARTRATE 50 MG PO TABS: 50.0000 mg | ORAL_TABLET | Freq: Two times a day (BID) | ORAL | Status: DC

## 2014-02-24 ENCOUNTER — Ambulatory Visit (INDEPENDENT_AMBULATORY_CARE_PROVIDER_SITE_OTHER): Payer: Medicare Other | Admitting: Family Medicine

## 2014-02-24 ENCOUNTER — Encounter: Payer: Self-pay | Admitting: Family Medicine

## 2014-02-24 VITALS — BP 132/78 | HR 88 | Temp 97.7°F | Resp 18 | Ht <= 58 in | Wt 180.0 lb

## 2014-02-24 DIAGNOSIS — J189 Pneumonia, unspecified organism: Secondary | ICD-10-CM

## 2014-02-24 DIAGNOSIS — R9389 Abnormal findings on diagnostic imaging of other specified body structures: Secondary | ICD-10-CM | POA: Diagnosis not present

## 2014-02-24 LAB — POCT CBC
GRANULOCYTE PERCENT: 64.9 % (ref 37–80)
HEMATOCRIT: 42.6 % (ref 37.7–47.9)
HEMOGLOBIN: 13.6 g/dL (ref 12.2–16.2)
Lymph, poc: 2.4 (ref 0.6–3.4)
MCH, POC: 28.6 pg (ref 27–31.2)
MCHC: 31.9 g/dL (ref 31.8–35.4)
MCV: 89.5 fL (ref 80–97)
MID (cbc): 0.7 (ref 0–0.9)
MPV: 8.9 fL (ref 0–99.8)
POC GRANULOCYTE: 5.7 (ref 2–6.9)
POC LYMPH PERCENT: 27.2 %L (ref 10–50)
POC MID %: 7.9 % (ref 0–12)
Platelet Count, POC: 179 10*3/uL (ref 142–424)
RBC: 4.76 M/uL (ref 4.04–5.48)
RDW, POC: 14.4 %
WBC: 8.8 10*3/uL (ref 4.6–10.2)

## 2014-02-24 NOTE — Progress Notes (Signed)
[facility] [address] [place]  [address] [phone]  Date:  02/24/2014   Name:  Latoya [name]   DOB:  [birth year]   MRN:  [id number]  PCP:  Thressa Sheller, MD    Chief Complaint: Pneumonia   History of Present Illness:  Latoya [name] is a 68 y.o. very pleasant female patient who presents with the following:  Here today to recheck pneumonia.  She was seen here 2 days ago with poor appearing CXR- had a CT scan and started on levaquin 500 daily.  She was started on levaquin.   She is feeling a bit better, but still tired.    Patient Active Problem List   Diagnosis Date Noted  . Anaphylaxis, mild, due to wasp envenomation 04/12/2013  . Chest pain, atypical 04/11/2013  . PAD (peripheral artery disease) 06/20/2011  . ANEMIA, IRON DEFICIENCY, MICROCYTIC 06/01/2010  . ANEMIA-UNSPECIFIED 06/01/2010  . Primary hypercoagulable state 06/01/2010  . BACK PAIN, CHRONIC 06/01/2010  . ABDOMINAL PAIN -GENERALIZED 06/01/2010  . CAROTID ARTERY DISEASE 05/25/2010  . CAD, NATIVE VESSEL 11/15/2009  . MURMUR 11/15/2009  . CAROTID BRUIT, LEFT 11/15/2009  . HYPERTENSION 03/18/2009  . PULMONARY EMBOLISM 03/18/2009  . PAROXYSMAL ATRIAL FIBRILLATION 03/18/2009  . DVT 03/18/2009  . ASTHMA 03/18/2009  . GERD 03/18/2009  . SPONDYLOSIS, LUMBAR 03/18/2009  . HYPERLIPIDEMIA 11/29/2008  . CORONARY HEART DISEASE 11/29/2008  . EXTRINSIC ASTHMA, UNSPECIFIED 11/25/2008    Past Medical History  Diagnosis Date  . Asthma     extrinsic; moderate, persistant, nml spirometry 2010, nml CXR 1/08  . Pulmonary embolism     related to back surgery with prior coumadin use, now off  . DVT (deep venous thrombosis)   . PAF (paroxysmal atrial fibrillation)     not on coumadin therapy  . HLD (hyperlipidemia)   . CAD (coronary artery disease)     50% mid LAD, 80% ostial D1 and moderate 80% mid circ by vath 2003  . Antiphospholipid syndrome     with hypercoagulable state  . Coronary  heart disease   . Drug allergy     heparin/lovenox  . Erosive gastritis 1994  . Anemia, iron deficiency   . HTN (hypertension)     [name] at [facility], manages pt.   . Complication of anesthesia     cardiac arrest- in OR, at age 53 (11)y.o. during Scalenotomy   . Anxiety   . Hypothyroidism   . Bladder troubles     REPORTS INFECTIONS ON OCCASION DUE TO URETHRA MEATUS STRICTURE AT BIRTH   . H/O hiatal hernia   . Spondylosis, lumbosacral     ARTHRITIS- OA   . Cancer     basal cell removed fr. L arm   . Blood dyscrasia   . Liver spot     cyst- - no biopsy, but told that its benign   . GERD (gastroesophageal reflux disease)     Past Surgical History  Procedure Laterality Date  . Back surgery      x12 cervical and lumbar thoracic spine surgery  . Total knee arthroplasty      left  . Cleft lip and palate repair      68 yo   . Hysterectomy    . Spina bifida repair      68 yo   . Cardiac catheterization      2005  . Joint replacement    . Tonsillectomy    . Breast surgery  all benign cysts x3   . Abdominal hysterectomy      partial abdominal -1998    History  Substance Use Topics  . Smoking status: Former Smoker    Quit date: 10/22/1972  . Smokeless tobacco: Never Used     Comment: no smoking   . Alcohol Use: No    Family History  Problem Relation Age of Onset  . Coronary artery disease Sister     MI     Allergies  Allergen Reactions  . Hornet Venom Anaphylaxis  . Amoxicillin-Pot Clavulanate Diarrhea    "patient stated that she can take this medication"  . Enoxaparin Sodium     REACTION: thrombocytopenia  . Heparin     REACTION: thrombocytopenia  . Lactose Intolerance (Gi)   . Other     Sensitive to dye in Betadine & Chlorohexadine   . Pork-Derived Products   . Povidone-Iodine     Sensitivity- but if its wiped off she is able to tolerate betadine   . Indomethacin Rash    Medication list has been reviewed and updated.  Current Outpatient  Prescriptions on File Prior to Visit  Medication Sig Dispense Refill  . albuterol (PROVENTIL) (2.5 MG/3ML) 0.083% nebulizer solution Take 2.5 mg by nebulization every 6 (six) hours as needed. For shortness of breath/wheezing      . aspirin 81 MG EC tablet Take 81 mg by mouth daily.       Marland Kitchen atorvastatin (LIPITOR) 20 MG tablet Take 1 tablet (20 mg total) by mouth daily.  90 tablet  3  . azelastine (ASTELIN) 137 MCG/SPRAY nasal spray Place 1 spray into the nose 2 (two) times daily as needed. Use in each nostril as directed For allergies      . beclomethasone (QVAR) 80 MCG/ACT inhaler Inhale 2 puffs into the lungs 3 (three) times daily.      . Calcium Carbonate-Vitamin D 600-400 MG-UNIT per tablet Take 1 tablet by mouth 2 (two) times daily.        . cyclobenzaprine (FLEXERIL) 10 MG tablet Take 10 mg by mouth 3 (three) times daily as needed. For severe muscle pain      . diazepam (VALIUM) 5 MG tablet Take 5 mg by mouth as needed.       . docusate sodium (COLACE) 100 MG capsule Take 200 mg by mouth 2 (two) times daily.       Marland Kitchen EPINEPHrine (EPIPEN) 0.3 mg/0.3 mL DEVI Inject 0.3 mLs (0.3 mg total) into the muscle once.  1 Device  2  . esomeprazole (NEXIUM) 40 MG capsule Take 40 mg by mouth 2 (two) times daily.       . fentaNYL (DURAGESIC - DOSED MCG/HR) 100 MCG/HR Place 1 patch onto the skin every other day.       . gabapentin (NEURONTIN) 600 MG tablet Take 900 mg by mouth at bedtime.       . Glucosamine HCl (GLUCOSAMINE PO) Take by mouth. Glucosamine ASU      . HYDROmorphone (DILAUDID) 4 MG tablet Take 4-8 mg by mouth every 4 (four) hours as needed. For pain      . levofloxacin (LEVAQUIN) 500 MG tablet Take 1 tablet (500 mg total) by mouth daily.  14 tablet  0  . levothyroxine (SYNTHROID, LEVOTHROID) 75 MCG tablet Take 75 mcg by mouth daily before breakfast.       . lidocaine (LIDODERM) 5 % Place 1 patch onto the skin as needed. Remove & Discard patch within 12 hours  or as directed by MD      .  Loratadine-Pseudoephedrine (CLARITIN-D 12 HOUR PO) Take 1 tablet by mouth 2 (two) times daily.       . metaxalone (SKELAXIN) 800 MG tablet Take 800 mg by mouth every 8 (eight) hours as needed. For muscle pain      . metoprolol (LOPRESSOR) 50 MG tablet Take 1 tablet (50 mg total) by mouth 2 (two) times daily.  180 tablet  4  . montelukast (SINGULAIR) 10 MG tablet Take 10 mg by mouth at bedtime.       . Multiple Vitamin (MULTIVITAMIN WITH MINERALS) TABS Take 1 tablet by mouth daily.      . nitroGLYCERIN (NITROSTAT) 0.4 MG SL tablet Place 1 tablet (0.4 mg total) under the tongue every 5 (five) minutes as needed. For chest pain  25 tablet  3  . nystatin (MYCOSTATIN/NYSTOP) 100000 UNIT/GM POWD Use as directed, apply to affected area BID  60 g  0  . triamcinolone (NASACORT) 55 MCG/ACT nasal inhaler Place 2 sprays into the nose 2 (two) times daily.        No current facility-administered medications on file prior to visit.    Review of Systems:  As per HPI- otherwise negative.   Physical Examination: Filed Vitals:   02/24/14 1002  BP: 132/78  Pulse: 88  Temp: 97.7 F (36.5 C)  Resp: 18   Filed Vitals:   02/24/14 1002  Height: 4\' 7"  (1.397 m)  Weight: 180 lb (81.647 kg)   Body mass index is 41.84 kg/(m^2). Ideal Body Weight: Weight in (lb) to have BMI = 25: 107.3  GEN: WDWN, NAD, Non-toxic, A & O x 3 HEENT: Atraumatic, Normocephalic. Neck supple. No masses, No LAD. Ears and Nose: No external deformity. CV: RRR, No M/G/R. No JVD. No thrill. No extra heart sounds. PULM: CTA B, no wheezes, crackles, rhonchi. No retractions. No resp. distress. No accessory muscle use. EXTR: No c/c/e NEURO Normal gait for pt- uses a walker, kyphosis PSYCH: Normally interactive. Conversant. Not depressed or anxious appearing.  Calm demeanor.   Results for orders placed in visit on 02/24/14  POCT CBC      Result Value Ref Range   WBC 8.8  4.6 - 10.2 K/uL   Lymph, poc 2.4  0.6 - 3.4   POC LYMPH  PERCENT 27.2  10 - 50 %L   MID (cbc) 0.7  0 - 0.9   POC MID % 7.9  0 - 12 %M   POC Granulocyte 5.7  2 - 6.9   Granulocyte percent 64.9  37 - 80 %G   RBC 4.76  4.04 - 5.48 M/uL   Hemoglobin 13.6  12.2 - 16.2 g/dL   HCT, POC 42.6  37.7 - 47.9 %   MCV 89.5  80 - 97 fL   MCH, POC 28.6  27 - 31.2 pg   MCHC 31.9  31.8 - 35.4 g/dL   RDW, POC 14.4     Platelet Count, POC 179  142 - 424 K/uL   MPV 8.9  0 - 99.8 fL    Assessment and Plan: Pneumonia - Plan: POCT CBC, CT Chest W Contrast  Abnormal CT scan, chest - Plan: CT Chest W Contrast   leukocytosis is resolved.   She will continue her levaquin and report any worsening of her condition.  Plan a repeat CT scan in about 8 weeks for follow-up. She does have a cleft lip which was repaired X2 in the past-  possible early aspiration may contribute to her lung issues   Signed Lamar Blinks, MD

## 2014-02-24 NOTE — Patient Instructions (Signed)
Your white blood count is much, much better!  Please continue the antibiotic and let me know if you do not continue to improve.  We will schedule a repeat CT scan in 2 months- can do this at [place] at your convenience.

## 2014-03-04 ENCOUNTER — Telehealth: Payer: Self-pay

## 2014-03-04 DIAGNOSIS — J029 Acute pharyngitis, unspecified: Secondary | ICD-10-CM

## 2014-03-04 MED ORDER — FIRST-DUKES MOUTHWASH MT SUSP: 5.0000 mL | Freq: Four times a day (QID) | OROMUCOSAL | Status: DC | PRN

## 2014-03-04 NOTE — Telephone Encounter (Signed)
I'll forward this to both Dr Lorelei Pont and also PA pool for review.

## 2014-03-04 NOTE — Telephone Encounter (Signed)
called- husband reports thrush from abx.  Did RF for her

## 2014-03-04 NOTE — Telephone Encounter (Signed)
Pt's husband called in regards to this MMW requested by the [phone]

## 2014-03-04 NOTE — Telephone Encounter (Signed)
Pharm reqs RF of [facility] MMW. Dr Lorelei Pont, you just saw this pt, but don't see disc about sore throat. Do you want to give pt RFs?

## 2014-03-12 ENCOUNTER — Encounter: Payer: Self-pay | Admitting: Family Medicine

## 2014-03-12 ENCOUNTER — Other Ambulatory Visit: Payer: Self-pay

## 2014-03-12 MED ORDER — NYSTATIN 100000 UNIT/GM EX POWD: CUTANEOUS | Status: DC

## 2014-03-16 ENCOUNTER — Encounter: Payer: Self-pay | Admitting: Cardiovascular Disease

## 2014-03-17 DIAGNOSIS — J189 Pneumonia, unspecified organism: Secondary | ICD-10-CM | POA: Diagnosis not present

## 2014-03-17 DIAGNOSIS — G894 Chronic pain syndrome: Secondary | ICD-10-CM | POA: Diagnosis not present

## 2014-03-17 DIAGNOSIS — M47817 Spondylosis without myelopathy or radiculopathy, lumbosacral region: Secondary | ICD-10-CM | POA: Diagnosis not present

## 2014-03-17 DIAGNOSIS — Z79899 Other long term (current) drug therapy: Secondary | ICD-10-CM | POA: Diagnosis not present

## 2014-03-17 DIAGNOSIS — M5412 Radiculopathy, cervical region: Secondary | ICD-10-CM | POA: Diagnosis not present

## 2014-03-19 ENCOUNTER — Other Ambulatory Visit (HOSPITAL_COMMUNITY): Payer: Self-pay | Admitting: Gastroenterology

## 2014-03-19 DIAGNOSIS — J690 Pneumonitis due to inhalation of food and vomit: Secondary | ICD-10-CM

## 2014-03-23 DIAGNOSIS — H2510 Age-related nuclear cataract, unspecified eye: Secondary | ICD-10-CM | POA: Diagnosis not present

## 2014-03-24 ENCOUNTER — Ambulatory Visit (HOSPITAL_COMMUNITY): Payer: Medicare Other

## 2014-03-24 DIAGNOSIS — T6391XA Toxic effect of contact with unspecified venomous animal, accidental (unintentional), initial encounter: Secondary | ICD-10-CM | POA: Diagnosis not present

## 2014-03-24 DIAGNOSIS — J309 Allergic rhinitis, unspecified: Secondary | ICD-10-CM | POA: Diagnosis not present

## 2014-03-24 DIAGNOSIS — J45909 Unspecified asthma, uncomplicated: Secondary | ICD-10-CM | POA: Diagnosis not present

## 2014-03-26 ENCOUNTER — Ambulatory Visit (HOSPITAL_COMMUNITY)
Admission: RE | Admit: 2014-03-26 | Discharge: 2014-03-26 | Disposition: A | Discharge status: Home / Self Care | Payer: Medicare Other | Source: Ambulatory Visit | Attending: Gastroenterology | Admitting: Gastroenterology

## 2014-03-26 DIAGNOSIS — K449 Diaphragmatic hernia without obstruction or gangrene: Secondary | ICD-10-CM | POA: Diagnosis not present

## 2014-03-26 DIAGNOSIS — K219 Gastro-esophageal reflux disease without esophagitis: Secondary | ICD-10-CM | POA: Diagnosis not present

## 2014-03-26 DIAGNOSIS — J690 Pneumonitis due to inhalation of food and vomit: Secondary | ICD-10-CM

## 2014-03-26 DIAGNOSIS — R1310 Dysphagia, unspecified: Secondary | ICD-10-CM | POA: Insufficient documentation

## 2014-03-26 NOTE — Procedures (Signed)
Objective Swallowing Evaluation: Modified Barium Swallowing Study  Patient Details  Name: Latoya [name] MRN: [id number] Date of Birth: 10/07/1946  Today's Date: 03/26/2014 Time: 1115-1200 SLP Time Calculation (min): 45 min  Past Medical History:  Past Medical History  Diagnosis Date  . Asthma     extrinsic; moderate, persistant, nml spirometry 2010, nml CXR 1/08  . Pulmonary embolism     related to back surgery with prior coumadin use, now off  . DVT (deep venous thrombosis)   . PAF (paroxysmal atrial fibrillation)     not on coumadin therapy  . HLD (hyperlipidemia)   . CAD (coronary artery disease)     50% mid LAD, 80% ostial D1 and moderate 80% mid circ by vath 2003  . Antiphospholipid syndrome     with hypercoagulable state  . Coronary heart disease   . Drug allergy     heparin/lovenox  . Erosive gastritis 1994  . Anemia, iron deficiency   . HTN (hypertension)     [name] at [facility], manages pt.   . Complication of anesthesia     cardiac arrest- in OR, at age 70 (26)y.o. during Scalenotomy   . Anxiety   . Hypothyroidism   . Bladder troubles     REPORTS INFECTIONS ON OCCASION DUE TO URETHRA MEATUS STRICTURE AT BIRTH   . H/O hiatal hernia   . Spondylosis, lumbosacral     ARTHRITIS- OA   . Cancer     basal cell removed fr. L arm   . Blood dyscrasia   . Liver spot     cyst- - no biopsy, but told that its benign   . GERD (gastroesophageal reflux disease)    Past Surgical History:  Past Surgical History  Procedure Laterality Date  . Back surgery      x12 cervical and lumbar thoracic spine surgery  . Total knee arthroplasty      left  . Cleft lip and palate repair      68 yo   . Hysterectomy    . Spina bifida repair      68 yo   . Cardiac catheterization      2005  . Joint replacement    . Tonsillectomy    . Breast surgery      all benign cysts x3   . 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 in the right lower lobe.  Patient has PMH of hiatal hernia and GERD, as well as Spondylosis requiring cervical fusion at C6-7 and Lumbar fusion level 1.  Patient denies swallowing issues being initiated after cervical fusion, and reports she feels her dysphagia is related to her hiatal hernia, which she feels is worsening.  Patient reports episodes of choking on solids, vomiting food if she eats too close to bedtime, and waking up at night coughing and wheezing sporadically.     Assessment / Plan / Recommendation Clinical Impression  Dysphagia Diagnosis: Suspected primary esophageal dysphagia Clinical impression: Patient exhibits a normal oropharyngeal swallow, with timely initiation of the swallow, no aspiration, one time transient trace penetration of thin liquid with large consecutive swallows, and no laryngeal or pharyngeal residue after the swallow.  During the esophageal sweep, there was what appeared to be mild esophageal stasis, which slowly cleared.  There appeared to be decreased esophageal motility/perestalsis, and the  pill appeared to be delayed briefly at thoracic level.  Esophageal disorders cannot be diagnosed by SLP, and there was no Radiologist present to confirm these observation.  Further GI studies are recommended.    Treatment Recommendation  No treatment recommended at this time    Diet Recommendation Dysphagia 3 (Mechanical Soft);Thin liquid   Liquid Administration via: Cup Medication Administration: Whole meds with liquid Supervision: Patient able to self feed Compensations: Slow rate;Small sips/bites;Follow solids with liquid Postural Changes and/or Swallow Maneuvers: Out of bed for meals;Seated upright 90 degrees;Upright 30-60 min after meal    Other  Recommendations Recommended Consults: Consider GI evaluation;Consider esophageal assessment Oral Care Recommendations: Oral  care BID;Patient independent with oral care Other Recommendations: Clarify dietary restrictions   Follow Up Recommendations  None    Frequency and Duration        Pertinent Vitals/Pain n/a      General HPI: 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 in the right lower lobe.  Patient has PMH of hiatal hernia and GERD, as well as Spondylosis requiring cervical fusion at C6-7 and Lumbar fusion level 1.  Patient denies swallowing issues being initiated after cervical fusion, and reports she feels her dysphagia is related to her hiatal hernia, which she feels is worsening.  Patient reports episodes of choking on solids, vomiting food if she eats too close to bedtime, and waking up at night coughing and wheezing sporadically. Type of Study: Modified Barium Swallowing Study Reason for Referral: Objectively evaluate swallowing function Diet Prior to this Study: Regular;Thin liquids Temperature Spikes Noted: No Respiratory Status: Room air History of Recent Intubation: No Oral Cavity - Dentition: Adequate natural dentition Oral Motor / Sensory Function: Within functional limits Self-Feeding Abilities: Able to feed self Patient Positioning: Upright in chair Baseline Vocal Quality: Clear Volitional Cough: Strong Volitional Swallow: Able to elicit Anatomy: Within functional limits Pharyngeal Secretions: Not observed secondary MBS    Reason for Referral Objectively evaluate swallowing function   Oral Phase Oral Preparation/Oral Phase Oral Phase: WFL   Pharyngeal Phase Pharyngeal Phase Pharyngeal Phase: Within functional limits  Cervical Esophageal Phase    GO    Cervical Esophageal Phase Cervical Esophageal Phase: [facility]    Functional Assessment Tool Used: Clinical judgement Functional Limitations: Swallowing Swallow Current Status (M0867): At least 1 percent but less than 20 percent  impaired, limited or restricted Swallow Goal Status [phone]): At least 1 percent but less than 20 percent impaired, limited or restricted Swallow Discharge Status [phone]): At least 1 percent but less than 20 percent impaired, limited or restricted    Joaquim Nam 03/26/2014, 1:29 PM

## 2014-04-01 ENCOUNTER — Other Ambulatory Visit: Payer: Self-pay | Admitting: Gastroenterology

## 2014-04-01 DIAGNOSIS — K219 Gastro-esophageal reflux disease without esophagitis: Secondary | ICD-10-CM

## 2014-04-04 ENCOUNTER — Encounter: Payer: Self-pay | Admitting: Family Medicine

## 2014-04-07 ENCOUNTER — Other Ambulatory Visit: Payer: Medicare Other

## 2014-04-09 ENCOUNTER — Ambulatory Visit: Payer: Medicare Other | Admitting: Cardiovascular Disease

## 2014-04-12 ENCOUNTER — Other Ambulatory Visit: Payer: Medicare Other

## 2014-04-16 ENCOUNTER — Other Ambulatory Visit: Payer: Self-pay | Admitting: Neurosurgery

## 2014-04-16 DIAGNOSIS — M545 Low back pain, unspecified: Secondary | ICD-10-CM | POA: Diagnosis not present

## 2014-04-16 DIAGNOSIS — Z6828 Body mass index (BMI) 28.0-28.9, adult: Secondary | ICD-10-CM | POA: Diagnosis not present

## 2014-04-16 DIAGNOSIS — M542 Cervicalgia: Secondary | ICD-10-CM | POA: Diagnosis not present

## 2014-04-16 DIAGNOSIS — R259 Unspecified abnormal involuntary movements: Secondary | ICD-10-CM | POA: Diagnosis not present

## 2014-04-16 DIAGNOSIS — M5412 Radiculopathy, cervical region: Secondary | ICD-10-CM | POA: Diagnosis not present

## 2014-04-16 DIAGNOSIS — M4120 Other idiopathic scoliosis, site unspecified: Secondary | ICD-10-CM | POA: Diagnosis not present

## 2014-04-19 ENCOUNTER — Ambulatory Visit
Admission: RE | Admit: 2014-04-19 | Discharge: 2014-04-19 | Disposition: A | Discharge status: Home / Self Care | Payer: Medicare Other | Source: Ambulatory Visit | Attending: Neurosurgery | Admitting: Neurosurgery

## 2014-04-19 ENCOUNTER — Ambulatory Visit
Admission: RE | Admit: 2014-04-19 | Discharge: 2014-04-19 | Disposition: A | Discharge status: Home / Self Care | Payer: Medicare Other | Source: Ambulatory Visit | Attending: Gastroenterology | Admitting: Gastroenterology

## 2014-04-19 ENCOUNTER — Ambulatory Visit
Admission: RE | Admit: 2014-04-19 | Discharge: 2014-04-19 | Disposition: A | Discharge status: Home / Self Care | Payer: Medicare Other | Source: Ambulatory Visit | Attending: Family Medicine | Admitting: Family Medicine

## 2014-04-19 DIAGNOSIS — K2289 Other specified disease of esophagus: Secondary | ICD-10-CM | POA: Diagnosis not present

## 2014-04-19 DIAGNOSIS — M47812 Spondylosis without myelopathy or radiculopathy, cervical region: Secondary | ICD-10-CM | POA: Diagnosis not present

## 2014-04-19 DIAGNOSIS — K228 Other specified diseases of esophagus: Secondary | ICD-10-CM | POA: Diagnosis not present

## 2014-04-19 DIAGNOSIS — M542 Cervicalgia: Secondary | ICD-10-CM

## 2014-04-19 DIAGNOSIS — K219 Gastro-esophageal reflux disease without esophagitis: Secondary | ICD-10-CM

## 2014-04-19 DIAGNOSIS — J9819 Other pulmonary collapse: Secondary | ICD-10-CM | POA: Diagnosis not present

## 2014-04-19 DIAGNOSIS — J189 Pneumonia, unspecified organism: Secondary | ICD-10-CM

## 2014-04-19 DIAGNOSIS — R9389 Abnormal findings on diagnostic imaging of other specified body structures: Secondary | ICD-10-CM

## 2014-04-19 DIAGNOSIS — M4802 Spinal stenosis, cervical region: Secondary | ICD-10-CM | POA: Diagnosis not present

## 2014-04-19 DIAGNOSIS — J438 Other emphysema: Secondary | ICD-10-CM | POA: Diagnosis not present

## 2014-04-19 MED ORDER — IOHEXOL 300 MG/ML  SOLN
75.0000 mL | Freq: Once | INTRAMUSCULAR | Status: AC | PRN
Start: 2014-04-19 — End: 2014-04-19
  Administered 2014-04-19: 75 mL via INTRAVENOUS

## 2014-04-21 ENCOUNTER — Ambulatory Visit (INDEPENDENT_AMBULATORY_CARE_PROVIDER_SITE_OTHER): Payer: Medicare Other | Admitting: Cardiovascular Disease

## 2014-04-21 ENCOUNTER — Encounter: Payer: Self-pay | Admitting: Cardiovascular Disease

## 2014-04-21 VITALS — BP 130/88 | HR 93 | Ht <= 58 in | Wt 178.0 lb

## 2014-04-21 DIAGNOSIS — I480 Paroxysmal atrial fibrillation: Secondary | ICD-10-CM

## 2014-04-21 DIAGNOSIS — E785 Hyperlipidemia, unspecified: Secondary | ICD-10-CM | POA: Diagnosis not present

## 2014-04-21 DIAGNOSIS — I10 Essential (primary) hypertension: Secondary | ICD-10-CM

## 2014-04-21 DIAGNOSIS — I2510 Atherosclerotic heart disease of native coronary artery without angina pectoris: Secondary | ICD-10-CM

## 2014-04-21 DIAGNOSIS — I658 Occlusion and stenosis of other precerebral arteries: Secondary | ICD-10-CM

## 2014-04-21 DIAGNOSIS — I4891 Unspecified atrial fibrillation: Secondary | ICD-10-CM

## 2014-04-21 DIAGNOSIS — I6523 Occlusion and stenosis of bilateral carotid arteries: Secondary | ICD-10-CM

## 2014-04-21 DIAGNOSIS — I6529 Occlusion and stenosis of unspecified carotid artery: Secondary | ICD-10-CM

## 2014-04-21 NOTE — Patient Instructions (Signed)
Your physician wants you to follow-up in:  6 months. You will receive a reminder letter in the mail two months in advance. If you don't receive a letter, please call our office to schedule the follow-up appointment.   

## 2014-04-21 NOTE — Progress Notes (Signed)
History of Present Illness: 68 yo female with HTN, hyperlipidemia, anti-phospholipid antibody syndrome, GERD, asthma, CAD s/p cath 2003 with 30% ostial LM stenosis, 50% mid LAD, 80% moderate sized Diagonal, 80% Circumflex. There was discussion about bypass at that time but medical management was pursued and she has done well with this plan over the years. She also has chronic back pain and paroxysmal atrial fibrillation. She has been previously followed by Dr. Johnsie Cancel in our office. She has a hypercoagulable state but has not been managed on coumadin because of prior intolerance. She has allergies to heparin (anaphylaxis) and Lovenox. Her coumadin had been stopped in the past because of trouble regulating her dose. She is seen by a hematologist at [facility] (Dr. Donney Dice) and he has told her that she does not need coumadin for her hypercoagulable state. We discussed Xarelto or Eliquis for PAF but she did not wish to start. She had been on amiodarone in the past but this was stopped in August 2010 by Dr. Johnsie Cancel. She has been diagnosed with hypothyroidism and has been on Synthroid. She has had no lower extremity edema but she does have chronic problems with pain in her feet from Raynaud's phenomenon and ongoing sciatica. Most recent stress test October 2013 with no ischemia, normal LV function.  Last echo June 2014 with normal LV size, mild LVH, normal LV systolic function with EF of 55%, mild MR with mildly thickened mitral valve leaflets. Carotid artery dopplers 12/16/13 with stable 40-59% bilateral stenosis.   She tells me today that she has been feeling well. She has had no chest pain. She has had 4 episodes of pneumonia over the last 4 months.    Primary Care Physician: Trilby Drummer   Last Lipid Profile: Followed in primary care. (Total chol 163, HDL: 63, LDL: 78 6/14)  Past Medical History  Diagnosis Date  . Asthma     extrinsic; moderate, persistant, nml spirometry 2010, nml CXR 1/08  . Pulmonary  embolism     related to back surgery with prior coumadin use, now off  . DVT (deep venous thrombosis)   . PAF (paroxysmal atrial fibrillation)     not on coumadin therapy  . HLD (hyperlipidemia)   . CAD (coronary artery disease)     50% mid LAD, 80% ostial D1 and moderate 80% mid circ by vath 2003  . Antiphospholipid syndrome     with hypercoagulable state  . Coronary heart disease   . Drug allergy     heparin/lovenox  . Erosive gastritis 1994  . Anemia, iron deficiency   . HTN (hypertension)     [name] at [facility], manages pt.   . Complication of anesthesia     cardiac arrest- in OR, at age 79 (12)y.o. during Scalenotomy   . Anxiety   . Hypothyroidism   . Bladder troubles     REPORTS INFECTIONS ON OCCASION DUE TO URETHRA MEATUS STRICTURE AT BIRTH   . H/O hiatal hernia   . Spondylosis, lumbosacral     ARTHRITIS- OA   . Cancer     basal cell removed fr. L arm   . Blood dyscrasia   . Liver spot     cyst- - no biopsy, but told that its benign   . GERD (gastroesophageal reflux disease)     Past Surgical History  Procedure Laterality Date  . Back surgery      x12 cervical and lumbar thoracic spine surgery  . Total knee arthroplasty  left  . Cleft lip and palate repair      68 yo   . Hysterectomy    . Spina bifida repair      68 yo   . Cardiac catheterization      2005  . Joint replacement    . Tonsillectomy    . Breast surgery      all benign cysts x3   . Abdominal hysterectomy      partial abdominal -1998    Current Outpatient Prescriptions  Medication Sig Dispense Refill  . albuterol (PROVENTIL) (2.5 MG/3ML) 0.083% nebulizer solution Take 2.5 mg by nebulization every 6 (six) hours as needed. For shortness of breath/wheezing      . aspirin 81 MG EC tablet Take 81 mg by mouth daily.       Marland Kitchen atorvastatin (LIPITOR) 20 MG tablet Take 1 tablet (20 mg total) by mouth daily.  90 tablet  3  . azelastine (ASTELIN) 137 MCG/SPRAY nasal spray Place 1 spray into the  nose 2 (two) times daily as needed. Use in each nostril as directed For allergies      . beclomethasone (QVAR) 80 MCG/ACT inhaler Inhale 2 puffs into the lungs 3 (three) times daily.      . Calcium Carbonate-Vitamin D 600-400 MG-UNIT per tablet Take 1 tablet by mouth 2 (two) times daily.        . cyclobenzaprine (FLEXERIL) 10 MG tablet Take 10 mg by mouth 3 (three) times daily as needed. For severe muscle pain      . diazepam (VALIUM) 5 MG tablet Take 5 mg by mouth as needed.       . docusate sodium (COLACE) 100 MG capsule Take 200 mg by mouth 2 (two) times daily.       Marland Kitchen EPINEPHrine (EPIPEN) 0.3 mg/0.3 mL DEVI Inject 0.3 mLs (0.3 mg total) into the muscle once.  1 Device  2  . esomeprazole (NEXIUM) 40 MG capsule Take 40 mg by mouth 2 (two) times daily.       . fentaNYL (DURAGESIC - DOSED MCG/HR) 100 MCG/HR Place 1 patch onto the skin every other day.       . gabapentin (NEURONTIN) 600 MG tablet Take 900 mg by mouth at bedtime.       . Glucosamine HCl (GLUCOSAMINE PO) Take 1.5 g by mouth 2 (two) times daily. Glucosamine ASU      . HYDROmorphone (DILAUDID) 4 MG tablet Take 4-8 mg by mouth every 4 (four) hours as needed. For pain      . levothyroxine (SYNTHROID, LEVOTHROID) 75 MCG tablet Take 75 mcg by mouth daily before breakfast.       . lidocaine (LIDODERM) 5 % Place 1 patch onto the skin as needed. Remove & Discard patch within 12 hours or as directed by MD      . Loratadine-Pseudoephedrine (CLARITIN-D 12 HOUR PO) Take 1 tablet by mouth 2 (two) times daily.       . metaxalone (SKELAXIN) 800 MG tablet Take 800 mg by mouth every 8 (eight) hours as needed. For muscle pain      . metoprolol (LOPRESSOR) 50 MG tablet Take 1 tablet (50 mg total) by mouth 2 (two) times daily.  180 tablet  4  . montelukast (SINGULAIR) 10 MG tablet Take 10 mg by mouth at bedtime.       . Multiple Vitamin (MULTIVITAMIN WITH MINERALS) TABS Take 1 tablet by mouth daily.      . nitroGLYCERIN (NITROSTAT) 0.4 MG SL  tablet  Place 1 tablet (0.4 mg total) under the tongue every 5 (five) minutes as needed. For chest pain  25 tablet  3  . sucralfate (CARAFATE) 1 G tablet Take 1 g by mouth 4 (four) times daily -  with meals and at bedtime.      . triamcinolone (NASACORT) 55 MCG/ACT nasal inhaler Place 2 sprays into the nose 2 (two) times daily.       Marland Kitchen levofloxacin (LEVAQUIN) 500 MG tablet Take 1 tablet (500 mg total) by mouth daily.  14 tablet  0   No current facility-administered medications for this visit.    Allergies  Allergen Reactions  . Hornet Venom Anaphylaxis  . Amoxicillin-Pot Clavulanate Diarrhea    "patient stated that she can take this medication"  . Enoxaparin Sodium     REACTION: thrombocytopenia  . Heparin     REACTION: thrombocytopenia  . Lactose Intolerance (Gi)   . Other     Sensitive to dye in Betadine & Chlorohexadine   . Pork-Derived Products   . Povidone-Iodine     Sensitivity- but if its wiped off she is able to tolerate betadine   . Indomethacin Rash  . Phenylpropanolamine Rash    Other Reaction: other reaction  . Povidone Iodine Rash    History   Social History  . Marital Status: Married    Spouse Name: N/A    Number of Children: N/A  . Years of Education: N/A   Occupational History  . Not on file.   Social History Main Topics  . Smoking status: Former Smoker    Quit date: 10/22/1972  . Smokeless tobacco: Never Used     Comment: no smoking   . Alcohol Use: No  . Drug Use: No  . Sexual Activity: Not on file   Other Topics Concern  . Not on file   Social History Narrative   Lives in [place] with husband; has had miscarriages but no children.    Disabled but exercises nearly every day (can only exericse in water - aerobics)   Takes opioids for pain relief; takes no herbal medications; has a heart healthy diet.     Family History  Problem Relation Age of Onset  . Coronary artery disease Sister     MI     Review of Systems:  As stated in the HPI and otherwise  negative.   BP 130/88  Pulse 93  Ht 4\' 7"  (1.397 m)  Wt 178 lb (80.74 kg)  BMI 41.37 kg/m2  Physical Examination: General: Well developed, well nourished, NAD HEENT: OP clear, mucus membranes moist SKIN: warm, dry. No rashes. Neuro: No focal deficits Musculoskeletal: Muscle strength 5/5 all ext Psychiatric: Mood and affect normal Neck: No JVD, no carotid bruits, no thyromegaly, no lymphadenopathy. Lungs:Clear bilaterally, no wheezes, rhonci, crackles Cardiovascular: Regular rate and rhythm. No murmurs, gallops or rubs. Abdomen:Soft. Bowel sounds present. Non-tender.  Extremities: No lower extremity edema. Pulses are trace in the bilateral DP/PT.    Echo 04/12/13: Left ventricle: The cavity size was normal. Systolic function was normal. The estimated ejection fraction was in the range of 50% to 55%.   ABI: Normal June 2014  Carotid artery dopplers: February 2015: Moderate bilateral stenosis, 40-59% stenosis bilateral ICA  Stress myoview: 08/13/12: Stress Procedure: The patient received IV Lexiscan 0.4 mg over 15-seconds. Technetium 34m Sestamibi injected at 30-seconds. There were significant changes with Lexiscan. The patient complained of chest pain with Lexiscan. Quantitative spect images were obtained after a 45  minute delay.  The patient complained of persistent headache with flushing of face and neck after Lexiscan that was relieved quickly after Aminophylline 75 mg IVP given.  Stress ECG: No significant change from baseline ECG  QPS  Raw Data Images: Patient motion noted.  Stress Images: lateral wall not interpretable due to bowel artifact  Rest Images: lateral wall not interpretable due to bowel artifact  Subtraction (SDS): Normal  Transient Ischemic Dilatation (Normal <1.22): 1.03  Lung/Heart Ratio (Normal <0.45): 0.45  Quantitative Gated Spect Images  QGS EDV: 95 ml  QGS ESV: 50 ml  Impression  Exercise Capacity: Lexiscan with no exercise.  BP Response: Normal  blood pressure response.  Clinical Symptoms: Headache requiring amynophylline  ECG Impression: No significant ST segment change suggestive of ischemia.  Comparison with Prior Nuclear Study: No images to compare  Overall Impression: Apical thinning no obvious ischemia Lateral wall not interpretable due to a large area of bowel artifact on both resting and stress images  LV Ejection Fraction: 47%. LV Wall Motion: Apical hypokinesis  Cardiac cath 09/22/02: 1. Ventriculography was performed in the RAO projection. Overall ejection  fraction was 70%. No segmental wall motion abnormalities were  identified.  2. The left main coronary artery has, what appears to be, about 30%  narrowing at the ostium. In most views there does appear to be a patent  ostium; however, some tapered narrowing cannot be excluded.  3. The LAD proper has about 40-50% narrowing, at most, in the mid portion  beyond the diagonal takeoff. The vessel is calcified. The diagonal  takeoff itself has a long 80% stenosis at the ostium extending out into  the mid portion of the diagonal. The distal diagonal does appear to be  suitable for grafting.  4. The large circumflex has about 70-80% focal narrowing prior to the  bifurcation of the marginal.  5. The right coronary artery has some mild luminal irregularities but no  high-grade stenoses.  IMPRESSION:  1. Coronary artery disease with significant involvement and circumflex  involvement.  2. Multiple medical issues, as described above.  EKG: NSR, rate 93 bpm.   Assessment and Plan:   1. CAD: Stress test 2013 without ischemia. No chest pain. She is known to have moderate CAD. Continue current medical management   2. PAROXYSMAL ATRIAL FIBRILLATION: Occasional palpitations. She has not been on coumadin because she refuses due to prior issues regulating levels. I have once again discussed one of the newer agents and she will consider. She will consider Xarelto or Eliquis in the  future and will call if she decides to try this.   3. HYPERTENSION: BP is controlled today.  No changes  4. CAROTID ARTERY DISEASE: Stable by dopplers 2/15. No changes. Repeat Feburary 2016  5. Bilateral foot pain: LE arterial dopplers June 2014 with normal ABI, no evidence of obstructive lower extremity arterial disease. Likely related to Raynaud's.   6. Hyperlipidemia: Continue statin. Lipids followed in primary care.

## 2014-04-28 ENCOUNTER — Other Ambulatory Visit: Payer: Self-pay | Admitting: Gastroenterology

## 2014-04-28 DIAGNOSIS — R12 Heartburn: Secondary | ICD-10-CM | POA: Diagnosis not present

## 2014-05-03 ENCOUNTER — Telehealth: Payer: Self-pay | Admitting: Family Medicine

## 2014-05-03 NOTE — Telephone Encounter (Signed)
Called to check on her and discuss findings of her CT 1. She is doing better, is now seeing GI. They think that her swallowing may be related to this pneumonia, and she is going to have an upper GI soon 2. Breathing and lungs continue to do well 3. She is aware of CAD, sees Dr. Angelena Form 4. She has had a CT when she was ill in the past which identified her liver finding as a hemangioma.  She does not desire further evaluation.    Noted that her mammogram maybe overdue. Will remind her of this with a letter

## 2014-05-08 ENCOUNTER — Telehealth: Payer: Self-pay

## 2014-05-08 ENCOUNTER — Encounter: Payer: Self-pay | Admitting: Family Medicine

## 2014-05-08 ENCOUNTER — Other Ambulatory Visit: Payer: Self-pay | Admitting: Family Medicine

## 2014-05-08 DIAGNOSIS — R9389 Abnormal findings on diagnostic imaging of other specified body structures: Secondary | ICD-10-CM

## 2014-05-08 NOTE — Telephone Encounter (Signed)
Called her back and when over her questions.  She has never been a smoker- will correct this in her history.  Smokes "maybe a couple of cigarettes in college."  States she has had spirometry which was normal in the past.  Reassured that comment of "emphysema" is likely not significant for her.  She did have a mammogram in March- will as [facility] to look at this in relation to her CT next week.  She is pleased with this

## 2014-05-08 NOTE — Telephone Encounter (Signed)
PATIENT WOULD LIKE TO SPEAK WITH DR. Lorelei Pont AS SOON AS POSSIBLE. [facility] HAS BEEN TREATING HER FOR PNEUMONIA. SHE HAS HAD SEVERAL CT SCANS AND SHE IS UPSET ABOUT THE REPORT. SHE WOULD LIKE TO ASK DR. COPLAND 2 THINGS ABOUT THE REPORT. FIRST, SHE WANTS TO KNOW IF SHE HAS A NODULE ON HER (R) BREAST? SHE ALSO WOULD LIKE TO KNOW IF SHE HAS EMPHYSEMA? BEST PHONE [phone] (CELL) OR [phone] (HOME)  [place]

## 2014-05-10 ENCOUNTER — Encounter (HOSPITAL_COMMUNITY): Payer: Self-pay | Admitting: Pharmacy Technician

## 2014-05-10 NOTE — Telephone Encounter (Signed)
[facility] and spoke with Dr. Luan Pulling.  She was able to compare her CT with mammogram, all ok. No further follow-up needed.  She is still worried about the mention of emphysema on her recent CT.  She would like to see Dr. Joya Gaskins who she has seen in the past.  Will do referral for her

## 2014-05-13 ENCOUNTER — Institutional Professional Consult (permissible substitution): Payer: Medicare Other | Admitting: Critical Care Medicine

## 2014-05-14 DIAGNOSIS — G894 Chronic pain syndrome: Secondary | ICD-10-CM | POA: Diagnosis not present

## 2014-05-14 DIAGNOSIS — M5412 Radiculopathy, cervical region: Secondary | ICD-10-CM | POA: Diagnosis not present

## 2014-05-14 DIAGNOSIS — M961 Postlaminectomy syndrome, not elsewhere classified: Secondary | ICD-10-CM | POA: Diagnosis not present

## 2014-05-14 DIAGNOSIS — M4000 Postural kyphosis, site unspecified: Secondary | ICD-10-CM | POA: Diagnosis not present

## 2014-05-19 DIAGNOSIS — Z981 Arthrodesis status: Secondary | ICD-10-CM | POA: Diagnosis not present

## 2014-05-19 DIAGNOSIS — R259 Unspecified abnormal involuntary movements: Secondary | ICD-10-CM | POA: Diagnosis not present

## 2014-05-19 DIAGNOSIS — M542 Cervicalgia: Secondary | ICD-10-CM | POA: Diagnosis not present

## 2014-05-19 DIAGNOSIS — M5412 Radiculopathy, cervical region: Secondary | ICD-10-CM | POA: Diagnosis not present

## 2014-05-31 ENCOUNTER — Encounter (HOSPITAL_COMMUNITY): Admission: RE | Payer: Self-pay | Source: Ambulatory Visit

## 2014-05-31 ENCOUNTER — Ambulatory Visit (HOSPITAL_COMMUNITY): Admission: RE | Admit: 2014-05-31 | Payer: Medicare Other | Source: Ambulatory Visit | Admitting: Gastroenterology

## 2014-05-31 SURGERY — ESOPHAGOGASTRODUODENOSCOPY (EGD) WITH PROPOFOL
Anesthesia: Monitor Anesthesia Care

## 2014-06-11 DIAGNOSIS — Z79899 Other long term (current) drug therapy: Secondary | ICD-10-CM | POA: Diagnosis not present

## 2014-06-11 DIAGNOSIS — M961 Postlaminectomy syndrome, not elsewhere classified: Secondary | ICD-10-CM | POA: Diagnosis not present

## 2014-06-11 DIAGNOSIS — G894 Chronic pain syndrome: Secondary | ICD-10-CM | POA: Diagnosis not present

## 2014-06-11 DIAGNOSIS — M5412 Radiculopathy, cervical region: Secondary | ICD-10-CM | POA: Diagnosis not present

## 2014-06-17 DIAGNOSIS — M5412 Radiculopathy, cervical region: Secondary | ICD-10-CM | POA: Diagnosis not present

## 2014-06-17 DIAGNOSIS — D6859 Other primary thrombophilia: Secondary | ICD-10-CM | POA: Diagnosis not present

## 2014-06-17 DIAGNOSIS — Z981 Arthrodesis status: Secondary | ICD-10-CM | POA: Diagnosis not present

## 2014-06-17 DIAGNOSIS — M542 Cervicalgia: Secondary | ICD-10-CM | POA: Diagnosis not present

## 2014-06-22 ENCOUNTER — Other Ambulatory Visit: Payer: Self-pay | Admitting: Neurosurgery

## 2014-06-22 ENCOUNTER — Telehealth: Payer: Self-pay | Admitting: Cardiovascular Disease

## 2014-06-22 ENCOUNTER — Encounter: Payer: Self-pay | Admitting: Cardiovascular Disease

## 2014-06-22 NOTE — Telephone Encounter (Signed)
See note below. Clearance form received in office requesting cardiac clearance for C6-7 Posterior cervical fusion with a Right C6-7 Foraminotomy,screws may be placed C5-T1.    Will forward to Dr. Angelena Form to see if office visit needed for pt to be cleared

## 2014-06-22 NOTE — Telephone Encounter (Signed)
I have written a letter of clearance for her. She has been very stable from a heart standpoint. cdm

## 2014-06-22 NOTE — Telephone Encounter (Signed)
Spoke with Janett Billow at Dr. Donald Pore' office and let her know letter would be faxed to office today. [phone]

## 2014-06-22 NOTE — Telephone Encounter (Signed)
Spoke with Janett Billow at Dr. Melven Sartorius office. She reports pt needs cardiac clearance for posterior cervical fusion. Their office had scheduled surgical clearance appt with Ellen Henri, [place] in our office for June 23, 2014. Janett Billow reports pt contacted her to let her know she had cancelled this appointment because she only wanted to see Dr. Angelena Form. Janett Billow reports pt asked her to contact me to discuss.  Janett Billow is faxing clearance request and last office note to our office. Once received will review with Dr. Angelena Form to determine if office visit needed.

## 2014-06-22 NOTE — Telephone Encounter (Signed)
New message     Talk to Latoya [name]---she would not tell me what she wanted.

## 2014-06-23 ENCOUNTER — Ambulatory Visit: Payer: Medicare Other | Admitting: Cardiology

## 2014-06-23 DIAGNOSIS — L570 Actinic keratosis: Secondary | ICD-10-CM | POA: Diagnosis not present

## 2014-06-23 DIAGNOSIS — C4441 Basal cell carcinoma of skin of scalp and neck: Secondary | ICD-10-CM | POA: Diagnosis not present

## 2014-06-23 DIAGNOSIS — Z85828 Personal history of other malignant neoplasm of skin: Secondary | ICD-10-CM | POA: Diagnosis not present

## 2014-06-24 ENCOUNTER — Ambulatory Visit (INDEPENDENT_AMBULATORY_CARE_PROVIDER_SITE_OTHER): Payer: Medicare Other | Admitting: Critical Care Medicine

## 2014-06-24 ENCOUNTER — Institutional Professional Consult (permissible substitution): Payer: Medicare Other | Admitting: Critical Care Medicine

## 2014-06-24 ENCOUNTER — Encounter: Payer: Self-pay | Admitting: Critical Care Medicine

## 2014-06-24 VITALS — BP 100/60 | HR 67 | Temp 97.6°F | Ht 60.0 in | Wt 164.0 lb

## 2014-06-24 DIAGNOSIS — J45909 Unspecified asthma, uncomplicated: Secondary | ICD-10-CM

## 2014-06-24 DIAGNOSIS — I2510 Atherosclerotic heart disease of native coronary artery without angina pectoris: Secondary | ICD-10-CM

## 2014-06-24 MED ORDER — BECLOMETHASONE DIPROPIONATE 80 MCG/ACT IN AERS: 2.0000 | INHALATION_SPRAY | Freq: Two times a day (BID) | RESPIRATORY_TRACT | Status: DC

## 2014-06-24 MED ORDER — FLUTTER DEVI: Status: DC

## 2014-06-24 NOTE — Patient Instructions (Signed)
Qvar two puff twice daily, use spacer Albuterol as needed Use Flutter valve twice daily Return 3 months

## 2014-06-24 NOTE — Progress Notes (Signed)
Subjective:    Patient ID: Latoya [name], female    DOB: 21-May-1946, 68 y.o.   MRN: [id number]  HPI 06/24/2014 Chief Complaint  Patient presents with  . Pulmonary Consult    Had CT Chest -recurrent aspiration pneumonia,no sob now, no cough   Here for abn CT chest , asp pna. Pt with hx of Urgent Care visits: last year in summer, chills dx ??  PE.  Pt went to hosp Dx PNA and no PE.  Pt well, then in spring, abn again with PNA.  Rx ABX Levaquin. Symptoms have been: weak, chills, no cough, pt was altered, ?fever, WBC elevated. Pt also dyspneic.    ? Of aspiration?  Pt had symptoms 3-4 times with this complex.   No coughing after eating.  Pt saw GI:  Swallow test done: normal.  Esophagram:  esoph spasms seen.  Pt with neck pain with this complex.  CT neck: pseudoarthrosis C 4567. Fx screw loose in neck.  Pt with HH and reflux symptoms.   Pt on medifast diet  Pt is on neurontin/duragesic/dilaudid.  Not using a bone stimulator.   Since 09/2013: using dilaudid about 3 x per day.     Review of Systems Constitutional:   No  weight loss, night sweats,  Fevers, chills, fatigue, lassitude. HEENT:   No headaches,  Difficulty swallowing,  Tooth/dental problems,  Sore throat,                No sneezing, itching, ear ache, nasal congestion, post nasal drip,   CV:  No chest pain,  Orthopnea, PND, swelling in lower extremities, anasarca, dizziness, palpitations  GI  No heartburn, indigestion, abdominal pain, nausea, vomiting, diarrhea, change in bowel habits, loss of appetite  Resp: Notes  shortness of breath with exertion not  at rest.  No excess mucus, no productive cough,  No non-productive cough,  No coughing up of blood.  No change in color of mucus.  No wheezing.  No chest wall deformity  Skin: no rash or lesions.  GU: no dysuria, change in color of urine, no urgency or frequency.  No flank pain.  MS:  No joint pain or swelling.  No decreased range of motion.  No back pain.  Psych:  No  change in mood or affect. No depression or anxiety.  No memory loss.     Objective:   Physical Exam Filed Vitals:   06/24/14 0952  BP: 100/60  Pulse: 67  Temp: 97.6 F (36.4 C)  TempSrc: Oral  Height: 5' (1.524 m)  Weight: 164 lb (74.39 kg)  SpO2: 97%    Gen: Pleasant, well-nourished, in no distress,  normal affect  ENT: No lesions,  mouth clear,  oropharynx clear, no postnasal drip  Neck: No JVD, no TMG, no carotid bruits  Lungs: No use of accessory muscles, no dullness to percussion, clear without rales or rhonchi  Cardiovascular: RRR, heart sounds normal, no murmur or gallops, no peripheral edema  Abdomen: soft and NT, no HSM,  BS normal  Musculoskeletal: No deformities, no cyanosis or clubbing, kyphotic spinal deformity  Neuro: alert, non focal  Skin: Warm, no lesions or rashes  No results found.        Assessment & Plan:   ASTHMA Moderate persistent asthma with microaspiration exacerbated by narcotic use and associated high level reflux with esophageal spasticity History of recurrent aspiration pneumonia now resolved Plan Use flutter valve regularly Use Qvar 80 mcg 2 puff twice daily with spacer and  patient was reinstructed as to the proper use    Updated Medication List Outpatient Encounter Prescriptions as of 06/24/2014  Medication Sig  . albuterol (PROVENTIL HFA;VENTOLIN HFA) 108 (90 BASE) MCG/ACT inhaler Inhale into the lungs every 6 (six) hours as needed for wheezing or shortness of breath.  Marland Kitchen aspirin 81 MG EC tablet Take 81 mg by mouth daily.   Marland Kitchen atorvastatin (LIPITOR) 20 MG tablet Take 20 mg by mouth daily.  Marland Kitchen azelastine (ASTELIN) 137 MCG/SPRAY nasal spray Place 1 spray into the nose 2 (two) times daily as needed. Use in each nostril as directed For allergies  . Calcium Carbonate-Vitamin D 600-400 MG-UNIT per tablet Take 1 tablet by mouth 2 (two) times daily.    . cyclobenzaprine (FLEXERIL) 10 MG tablet Take 10 mg by mouth 3 (three) times daily  as needed. For severe muscle pain  . diazepam (VALIUM) 5 MG tablet Take 5 mg by mouth at bedtime as needed for anxiety or sedation.   . docusate sodium (COLACE) 100 MG capsule Take 200 mg by mouth 2 (two) times daily.   Marland Kitchen EPINEPHrine (EPIPEN) 0.3 mg/0.3 mL DEVI Inject 0.3 mLs (0.3 mg total) into the muscle once.  Marland Kitchen esomeprazole (NEXIUM) 40 MG capsule Take 40 mg by mouth 2 (two) times daily.   . fentaNYL (DURAGESIC - DOSED MCG/HR) 75 MCG/HR Place 75 mcg onto the skin every other day.  . gabapentin (NEURONTIN) 600 MG tablet Take 600 mg by mouth at bedtime.   . Glucosamine HCl (GLUCOSAMINE PO) Take 2 tablets by mouth 2 (two) times daily. Glucosamine ASU  . HYDROmorphone (DILAUDID) 4 MG tablet Take 4-8 mg by mouth every 4 (four) hours as needed. For pain  . levothyroxine (SYNTHROID, LEVOTHROID) 75 MCG tablet Take 75 mcg by mouth daily before breakfast.   . lidocaine (LIDODERM) 5 % Place 1 patch onto the skin as needed. Remove & Discard patch within 12 hours or as directed by MD  . Loratadine-Pseudoephedrine (CLARITIN-D 12 HOUR PO) Take 1 tablet by mouth 2 (two) times daily.   . metaxalone (SKELAXIN) 800 MG tablet Take 800 mg by mouth every 8 (eight) hours as needed. For muscle pain  . metoprolol (LOPRESSOR) 50 MG tablet Take 1 tablet (50 mg total) by mouth 2 (two) times daily.  . montelukast (SINGULAIR) 10 MG tablet Take 10 mg by mouth at bedtime.   . Multiple Vitamin (MULTIVITAMIN WITH MINERALS) TABS Take 1 tablet by mouth daily.  . nitroGLYCERIN (NITROSTAT) 0.4 MG SL tablet Place 1 tablet (0.4 mg total) under the tongue every 5 (five) minutes as needed. For chest pain  . triamcinolone (NASACORT) 55 MCG/ACT nasal inhaler Place 1-2 sprays into the nose 2 (two) times daily.   . beclomethasone (QVAR) 80 MCG/ACT inhaler Inhale 2 puffs into the lungs 2 (two) times daily.  Marland Kitchen Respiratory Therapy Supplies (FLUTTER) DEVI Use twice daily  . [DISCONTINUED] beclomethasone (QVAR) 80 MCG/ACT inhaler Inhale 2  puffs into the lungs 3 (three) times daily.

## 2014-06-24 NOTE — Assessment & Plan Note (Signed)
Moderate persistent asthma with microaspiration exacerbated by narcotic use and associated high level reflux with esophageal spasticity History of recurrent aspiration pneumonia now resolved Plan Use flutter valve regularly Use Qvar 80 mcg 2 puff twice daily with spacer and patient was reinstructed as to the proper use

## 2014-07-02 DIAGNOSIS — Z23 Encounter for immunization: Secondary | ICD-10-CM | POA: Diagnosis not present

## 2014-07-20 ENCOUNTER — Emergency Department (HOSPITAL_COMMUNITY): Payer: Medicare Other

## 2014-07-20 ENCOUNTER — Emergency Department (HOSPITAL_COMMUNITY)
Admission: EM | Admit: 2014-07-20 | Discharge: 2014-07-20 | Disposition: A | Discharge status: Home / Self Care | Payer: Medicare Other | Attending: Emergency Medicine | Admitting: Emergency Medicine

## 2014-07-20 ENCOUNTER — Encounter (HOSPITAL_COMMUNITY): Payer: Self-pay | Admitting: Emergency Medicine

## 2014-07-20 DIAGNOSIS — Z85828 Personal history of other malignant neoplasm of skin: Secondary | ICD-10-CM | POA: Diagnosis not present

## 2014-07-20 DIAGNOSIS — J90 Pleural effusion, not elsewhere classified: Secondary | ICD-10-CM | POA: Diagnosis not present

## 2014-07-20 DIAGNOSIS — F411 Generalized anxiety disorder: Secondary | ICD-10-CM | POA: Insufficient documentation

## 2014-07-20 DIAGNOSIS — I10 Essential (primary) hypertension: Secondary | ICD-10-CM | POA: Diagnosis not present

## 2014-07-20 DIAGNOSIS — Z880 Allergy status to penicillin: Secondary | ICD-10-CM | POA: Diagnosis not present

## 2014-07-20 DIAGNOSIS — J159 Unspecified bacterial pneumonia: Secondary | ICD-10-CM | POA: Diagnosis not present

## 2014-07-20 DIAGNOSIS — IMO0002 Reserved for concepts with insufficient information to code with codable children: Secondary | ICD-10-CM | POA: Diagnosis not present

## 2014-07-20 DIAGNOSIS — Z86711 Personal history of pulmonary embolism: Secondary | ICD-10-CM | POA: Insufficient documentation

## 2014-07-20 DIAGNOSIS — K219 Gastro-esophageal reflux disease without esophagitis: Secondary | ICD-10-CM | POA: Diagnosis not present

## 2014-07-20 DIAGNOSIS — R079 Chest pain, unspecified: Secondary | ICD-10-CM | POA: Diagnosis not present

## 2014-07-20 DIAGNOSIS — I2510 Atherosclerotic heart disease of native coronary artery without angina pectoris: Secondary | ICD-10-CM | POA: Insufficient documentation

## 2014-07-20 DIAGNOSIS — Z862 Personal history of diseases of the blood and blood-forming organs and certain disorders involving the immune mechanism: Secondary | ICD-10-CM | POA: Insufficient documentation

## 2014-07-20 DIAGNOSIS — Z9889 Other specified postprocedural states: Secondary | ICD-10-CM | POA: Diagnosis not present

## 2014-07-20 DIAGNOSIS — E785 Hyperlipidemia, unspecified: Secondary | ICD-10-CM | POA: Diagnosis not present

## 2014-07-20 DIAGNOSIS — Z7982 Long term (current) use of aspirin: Secondary | ICD-10-CM | POA: Insufficient documentation

## 2014-07-20 DIAGNOSIS — J45901 Unspecified asthma with (acute) exacerbation: Secondary | ICD-10-CM | POA: Insufficient documentation

## 2014-07-20 DIAGNOSIS — E039 Hypothyroidism, unspecified: Secondary | ICD-10-CM | POA: Diagnosis not present

## 2014-07-20 DIAGNOSIS — Z86718 Personal history of other venous thrombosis and embolism: Secondary | ICD-10-CM | POA: Insufficient documentation

## 2014-07-20 DIAGNOSIS — J189 Pneumonia, unspecified organism: Secondary | ICD-10-CM

## 2014-07-20 DIAGNOSIS — Z79899 Other long term (current) drug therapy: Secondary | ICD-10-CM | POA: Insufficient documentation

## 2014-07-20 LAB — BASIC METABOLIC PANEL
Anion gap: 15 (ref 5–15)
BUN: 23 mg/dL (ref 6–23)
CALCIUM: 10 mg/dL (ref 8.4–10.5)
CO2: 27 mEq/L (ref 19–32)
Chloride: 93 mEq/L — ABNORMAL LOW (ref 96–112)
Creatinine, Ser: 0.47 mg/dL — ABNORMAL LOW (ref 0.50–1.10)
GLUCOSE: 114 mg/dL — AB (ref 70–99)
Potassium: 4.4 mEq/L (ref 3.7–5.3)
SODIUM: 135 meq/L — AB (ref 137–147)

## 2014-07-20 LAB — CBC WITH DIFFERENTIAL/PLATELET
Basophils Absolute: 0 10*3/uL (ref 0.0–0.1)
Basophils Relative: 0 % (ref 0–1)
EOS PCT: 1 % (ref 0–5)
Eosinophils Absolute: 0.1 10*3/uL (ref 0.0–0.7)
HCT: 45.1 % (ref 36.0–46.0)
Hemoglobin: 15.1 g/dL — ABNORMAL HIGH (ref 12.0–15.0)
Lymphocytes Relative: 7 % — ABNORMAL LOW (ref 12–46)
Lymphs Abs: 1.5 10*3/uL (ref 0.7–4.0)
MCH: 28.3 pg (ref 26.0–34.0)
MCHC: 33.5 g/dL (ref 30.0–36.0)
MCV: 84.6 fL (ref 78.0–100.0)
Monocytes Absolute: 1.2 10*3/uL — ABNORMAL HIGH (ref 0.1–1.0)
Monocytes Relative: 5 % (ref 3–12)
NEUTROS PCT: 87 % — AB (ref 43–77)
Neutro Abs: 19.5 10*3/uL — ABNORMAL HIGH (ref 1.7–7.7)
PLATELETS: 219 10*3/uL (ref 150–400)
RBC: 5.33 MIL/uL — ABNORMAL HIGH (ref 3.87–5.11)
RDW: 14.1 % (ref 11.5–15.5)
WBC: 22.3 10*3/uL — ABNORMAL HIGH (ref 4.0–10.5)

## 2014-07-20 LAB — I-STAT CG4 LACTIC ACID, ED: LACTIC ACID, VENOUS: 2.42 mmol/L — AB (ref 0.5–2.2)

## 2014-07-20 LAB — I-STAT TROPONIN, ED: Troponin i, poc: 0 ng/mL (ref 0.00–0.08)

## 2014-07-20 LAB — PRO B NATRIURETIC PEPTIDE: PRO B NATRI PEPTIDE: 99.8 pg/mL (ref 0–125)

## 2014-07-20 MED ORDER — SODIUM CHLORIDE 0.9 % IV BOLUS (SEPSIS)
1000.0000 mL | Freq: Once | INTRAVENOUS | Status: AC
  Administered 2014-07-20: 1000 mL via INTRAVENOUS

## 2014-07-20 MED ORDER — DEXTROSE 5 % IV SOLN
1.0000 g | INTRAVENOUS | Status: DC
  Administered 2014-07-20: 1 g via INTRAVENOUS
  Filled 2014-07-20: qty 10

## 2014-07-20 MED ORDER — SODIUM CHLORIDE 0.9 % IV SOLN
INTRAVENOUS | Status: DC
  Administered 2014-07-20: 19:00:00 via INTRAVENOUS

## 2014-07-20 MED ORDER — LEVOFLOXACIN 750 MG PO TABS: 750.0000 mg | ORAL_TABLET | Freq: Every day | ORAL | Status: DC

## 2014-07-20 MED ORDER — HYDROMORPHONE HCL 1 MG/ML IJ SOLN
1.0000 mg | Freq: Once | INTRAMUSCULAR | Status: AC
Start: 2014-07-20 — End: 2014-07-20
  Administered 2014-07-20: 1 mg via INTRAVENOUS
  Filled 2014-07-20: qty 1

## 2014-07-20 MED ORDER — IOHEXOL 350 MG/ML SOLN
100.0000 mL | Freq: Once | INTRAVENOUS | Status: AC | PRN
  Administered 2014-07-20: 100 mL via INTRAVENOUS

## 2014-07-20 MED ORDER — DEXTROSE 5 % IV SOLN
500.0000 mg | Freq: Once | INTRAVENOUS | Status: AC
  Administered 2014-07-20: 500 mg via INTRAVENOUS
  Filled 2014-07-20: qty 500

## 2014-07-20 NOTE — ED Provider Notes (Signed)
TIME SEEN: 5:49 PM  CHIEF COMPLAINT: Right-sided chest pain, shortness of breath, cough  HPI: Patient is a 68 y.o. F with history of coronary artery disease, antiphospholipid syndrome only on an aspirin daily with prior history of DVT and pulmonary embolus, asthma, frequent pneumonia who presents emergency progress the chest pain that feels it is stabbing pain and shortness of breath that started last night. No radiation of pain. Pain worse with deep inspiration. No alleviating factors. Does not feel like her prior pulmonary embolus. States this feels like her prior episodes of pneumonia. She has not noticed a fever but is febrile in the ED. She has had a nonproductive cough. No vomiting or diarrhea. No rash. No headache or neck pain. No sick contacts.  ROS: See HPI Constitutional: no fever  Eyes: no drainage  ENT: no runny nose   Cardiovascular:   chest pain  Resp: SOB  GI: no vomiting GU: no dysuria Integumentary: no rash  Allergy: no hives  Musculoskeletal: no leg swelling  Neurological: no slurred speech ROS otherwise negative  PAST MEDICAL HISTORY/PAST SURGICAL HISTORY:  Past Medical History  Diagnosis Date  . Asthma     extrinsic; moderate, persistant, nml spirometry 2010, nml CXR 1/08  . Pulmonary embolism     related to back surgery with prior coumadin use, now off  . DVT (deep venous thrombosis)   . PAF (paroxysmal atrial fibrillation)     not on coumadin therapy  . HLD (hyperlipidemia)   . CAD (coronary artery disease)     50% mid LAD, 80% ostial D1 and moderate 80% mid circ by vath 2003  . Antiphospholipid syndrome     with hypercoagulable state  . Coronary heart disease   . Drug allergy     heparin/lovenox  . Erosive gastritis 1994  . Anemia, iron deficiency   . HTN (hypertension)     [name] at [facility], manages pt.   . Complication of anesthesia     cardiac arrest- in OR, at age 65 (41)y.o. during Scalenotomy   . Anxiety   . Hypothyroidism   . Bladder  troubles     REPORTS INFECTIONS ON OCCASION DUE TO URETHRA MEATUS STRICTURE AT BIRTH   . H/O hiatal hernia   . Spondylosis, lumbosacral     ARTHRITIS- OA   . Cancer     basal cell removed fr. L arm   . Blood dyscrasia   . Liver spot     cyst- - no biopsy, but told that its benign   . GERD (gastroesophageal reflux disease)     MEDICATIONS:  Prior to Admission medications   Medication Sig Start Date End Date Taking? Authorizing Provider  albuterol (PROVENTIL HFA;VENTOLIN HFA) 108 (90 BASE) MCG/ACT inhaler Inhale into the lungs every 6 (six) hours as needed for wheezing or shortness of breath.    Historical Provider, MD  aspirin 81 MG EC tablet Take 81 mg by mouth daily.     Historical Provider, MD  atorvastatin (LIPITOR) 20 MG tablet Take 20 mg by mouth daily.    Historical Provider, MD  azelastine (ASTELIN) 137 MCG/SPRAY nasal spray Place 1 spray into the nose 2 (two) times daily as needed. Use in each nostril as directed For allergies    Historical Provider, MD  beclomethasone (QVAR) 80 MCG/ACT inhaler Inhale 2 puffs into the lungs 2 (two) times daily. 06/24/14   Elsie Stain, MD  Calcium Carbonate-Vitamin D 600-400 MG-UNIT per tablet Take 1 tablet by mouth 2 (two) times  daily.      Historical Provider, MD  cyclobenzaprine (FLEXERIL) 10 MG tablet Take 10 mg by mouth 3 (three) times daily as needed. For severe muscle pain    Historical Provider, MD  diazepam (VALIUM) 5 MG tablet Take 5 mg by mouth at bedtime as needed for anxiety or sedation.     Historical Provider, MD  docusate sodium (COLACE) 100 MG capsule Take 200 mg by mouth 2 (two) times daily.     Historical Provider, MD  EPINEPHrine (EPIPEN) 0.3 mg/0.3 mL DEVI Inject 0.3 mLs (0.3 mg total) into the muscle once. 04/12/13   [name] Rama, MD  esomeprazole (NEXIUM) 40 MG capsule Take 40 mg by mouth 2 (two) times daily.     Historical Provider, MD  fentaNYL (DURAGESIC - DOSED MCG/HR) 75 MCG/HR Place 75 mcg onto the skin every  other day.    Historical Provider, MD  gabapentin (NEURONTIN) 600 MG tablet Take 600 mg by mouth at bedtime.     Historical Provider, MD  Glucosamine HCl (GLUCOSAMINE PO) Take 2 tablets by mouth 2 (two) times daily. Glucosamine ASU    Historical Provider, MD  HYDROmorphone (DILAUDID) 4 MG tablet Take 4-8 mg by mouth every 4 (four) hours as needed. For pain    Historical Provider, MD  levothyroxine (SYNTHROID, LEVOTHROID) 75 MCG tablet Take 75 mcg by mouth daily before breakfast.     Historical Provider, MD  lidocaine (LIDODERM) 5 % Place 1 patch onto the skin as needed. Remove & Discard patch within 12 hours or as directed by MD    Historical Provider, MD  Loratadine-Pseudoephedrine (CLARITIN-D 12 HOUR PO) Take 1 tablet by mouth 2 (two) times daily.     Historical Provider, MD  metaxalone (SKELAXIN) 800 MG tablet Take 800 mg by mouth every 8 (eight) hours as needed. For muscle pain    Historical Provider, MD  metoprolol (LOPRESSOR) 50 MG tablet Take 1 tablet (50 mg total) by mouth 2 (two) times daily. 02/23/14   Burnell Blanks, MD  montelukast (SINGULAIR) 10 MG tablet Take 10 mg by mouth at bedtime.     Historical Provider, MD  Multiple Vitamin (MULTIVITAMIN WITH MINERALS) TABS Take 1 tablet by mouth daily.    Historical Provider, MD  nitroGLYCERIN (NITROSTAT) 0.4 MG SL tablet Place 1 tablet (0.4 mg total) under the tongue every 5 (five) minutes as needed. For chest pain 09/01/13   Burnell Blanks, MD  Respiratory Therapy Supplies (FLUTTER) DEVI Use twice daily 06/24/14   Elsie Stain, MD  triamcinolone (NASACORT) 55 MCG/ACT nasal inhaler Place 1-2 sprays into the nose 2 (two) times daily.     Historical Provider, MD    ALLERGIES:  Allergies  Allergen Reactions  . Hornet Venom Anaphylaxis  . Pork-Derived Products Anaphylaxis  . Amoxicillin-Pot Clavulanate Diarrhea    "patient stated that she can take this medication"  . Enoxaparin Sodium     REACTION: thrombocytopenia  .  Heparin     REACTION: thrombocytopenia  . Lactose Intolerance (Gi)   . Other     Sensitive to dye in Betadine & Chlorohexadine   . Povidone-Iodine     Sensitivity- but if its wiped off she is able to tolerate betadine   . Indomethacin Rash  . Phenylpropanolamine Rash    Other Reaction: other reaction  . Povidone Iodine Rash    SOCIAL HISTORY:  History  Substance Use Topics  . Smoking status: Never Smoker   . Smokeless tobacco: Never Used  Comment: no smoking   . Alcohol Use: No    FAMILY HISTORY: Family History  Problem Relation Age of Onset  . Heart disease Father     MI   . Heart disease Sister     EXAM: BP 157/107  Pulse 95  Temp(Src) 99.1 F (37.3 C) (Oral)  Resp 24  SpO2 100% CONSTITUTIONAL: Alert and oriented and responds appropriately to questions. Well-appearing; well-nourished HEAD: Normocephalic EYES: Conjunctivae clear, PERRL ENT: normal nose; no rhinorrhea; moist mucous membranes; pharynx without lesions noted NECK: Supple, no meningismus, no LAD  CARD: RRR; S1 and S2 appreciated; no murmurs, no clicks, no rubs, no gallops RESP: Normal chest excursion without splinting or tachypnea; breath sounds equal bilaterally but he does have bibasilar rales, no rhonchi or wheezing, no respiratory distress, hypoxia, speaking full sentences ABD/GI: Normal bowel sounds; non-distended; soft, non-tender, no rebound, no guarding BACK:  The back appears normal and is non-tender to palpation, there is no CVA tenderness EXT: Normal ROM in all joints; non-tender to palpation; no edema; normal capillary refill; no cyanosis    SKIN: Normal color for age and race; warm, no rash NEURO: Moves all extremities equally PSYCH: The patient's mood and manner are appropriate. Grooming and personal hygiene are appropriate.  MEDICAL DECISION MAKING: Patient here with chest pain shortness of breath. Concern for possible pneumonia, ACS, pulmonary bolus. We'll obtain labs, cultures,  lactate and a CT of her chest. Anticipate admission.  ED PROGRESS: Patient has a leukocytosis of 22.3 with left shift. Lactate is mildly elevated at 2.42. CT chest shows no pulmonary embolus the patient does have a multifocal patchy right upper lobe opacity suspicious for pneumonia as well as patchy opacity in the medial right middle lobe and right lower lobe that could be scarring versus atelectasis. There is a small right pleural effusion. Patient has not had any hypoxia or respiratory distress. She is speaking full senses. Discussed with patient who does not want admission at this time. Discussed with her that she does meet criteria for sepsis and I strongly recommended she stay in the hospital but she refuses at this time. She reports that Levaquin has done well for her in the past and she is requesting that we discharge her home with prescription for the same. She reports she will followup with her PCP in the next 24-48 hours. Her husband at bedside would also like to take her home. At this time I do not feel the patient needs to sign out [place] at home and strongly encouraged her to stay in the hospital. Patient still refuses and understands there are risks. She reports that if anything gets worse she will come back to the hospital immediately. Discussed supportive care instructions and importance of starting her antibiotics tomorrow morning. Discussed return precautions. She verbalized understanding and is comfortable with plan.     EKG Interpretation  Date/Time:  Tuesday July 20 2014 16:55:59 EDT Ventricular Rate:  90 PR Interval:  156 QRS Duration: 85 QT Interval:  346 QTC Calculation: 423 R Axis:   39 Text Interpretation:  Sinus rhythm Minimal ST depression V2, V3 which appears similar form EKG on 7/21/'14 Confirmed by DOCHERTY  MD, [place] [phone]) on 07/20/2014 5:05:16 PM        [place], DO 07/21/14 0030

## 2014-07-20 NOTE — Discharge Instructions (Signed)

## 2014-07-20 NOTE — ED Notes (Signed)
Pt sts she went to sleep last night not feeling well, with pain under the lower right breast.  When she woke up this morning, she has had to remain upright in order to breath efficiently.  Patient describes pain as stabbing, only under the right breast.  Sts it hurts mostly with deep breaths.

## 2014-07-22 ENCOUNTER — Telehealth (HOSPITAL_BASED_OUTPATIENT_CLINIC_OR_DEPARTMENT_OTHER): Payer: Self-pay | Admitting: Emergency Medicine

## 2014-07-22 ENCOUNTER — Emergency Department (HOSPITAL_COMMUNITY)
Admission: EM | Admit: 2014-07-22 | Discharge: 2014-07-22 | Disposition: A | Discharge status: Home / Self Care | Payer: Medicare Other | Attending: Emergency Medicine | Admitting: Emergency Medicine

## 2014-07-22 ENCOUNTER — Encounter (HOSPITAL_COMMUNITY): Payer: Self-pay | Admitting: Emergency Medicine

## 2014-07-22 ENCOUNTER — Emergency Department (HOSPITAL_COMMUNITY): Payer: Medicare Other

## 2014-07-22 DIAGNOSIS — I480 Paroxysmal atrial fibrillation: Secondary | ICD-10-CM | POA: Insufficient documentation

## 2014-07-22 DIAGNOSIS — F419 Anxiety disorder, unspecified: Secondary | ICD-10-CM | POA: Insufficient documentation

## 2014-07-22 DIAGNOSIS — K219 Gastro-esophageal reflux disease without esophagitis: Secondary | ICD-10-CM | POA: Insufficient documentation

## 2014-07-22 DIAGNOSIS — E785 Hyperlipidemia, unspecified: Secondary | ICD-10-CM | POA: Diagnosis not present

## 2014-07-22 DIAGNOSIS — J159 Unspecified bacterial pneumonia: Secondary | ICD-10-CM | POA: Insufficient documentation

## 2014-07-22 DIAGNOSIS — R895 Abnormal microbiological findings in specimens from other organs, systems and tissues: Secondary | ICD-10-CM | POA: Diagnosis not present

## 2014-07-22 DIAGNOSIS — Z85828 Personal history of other malignant neoplasm of skin: Secondary | ICD-10-CM | POA: Insufficient documentation

## 2014-07-22 DIAGNOSIS — Z800 Family history of malignant neoplasm of digestive organs: Secondary | ICD-10-CM | POA: Insufficient documentation

## 2014-07-22 DIAGNOSIS — Z79899 Other long term (current) drug therapy: Secondary | ICD-10-CM | POA: Insufficient documentation

## 2014-07-22 DIAGNOSIS — R5383 Other fatigue: Secondary | ICD-10-CM | POA: Diagnosis not present

## 2014-07-22 DIAGNOSIS — Z86718 Personal history of other venous thrombosis and embolism: Secondary | ICD-10-CM | POA: Insufficient documentation

## 2014-07-22 DIAGNOSIS — I10 Essential (primary) hypertension: Secondary | ICD-10-CM | POA: Insufficient documentation

## 2014-07-22 DIAGNOSIS — J45901 Unspecified asthma with (acute) exacerbation: Secondary | ICD-10-CM | POA: Diagnosis not present

## 2014-07-22 DIAGNOSIS — I2510 Atherosclerotic heart disease of native coronary artery without angina pectoris: Secondary | ICD-10-CM | POA: Diagnosis not present

## 2014-07-22 DIAGNOSIS — E039 Hypothyroidism, unspecified: Secondary | ICD-10-CM | POA: Diagnosis not present

## 2014-07-22 DIAGNOSIS — J189 Pneumonia, unspecified organism: Secondary | ICD-10-CM

## 2014-07-22 DIAGNOSIS — Z7982 Long term (current) use of aspirin: Secondary | ICD-10-CM | POA: Diagnosis not present

## 2014-07-22 DIAGNOSIS — Z87448 Personal history of other diseases of urinary system: Secondary | ICD-10-CM | POA: Diagnosis not present

## 2014-07-22 DIAGNOSIS — Z86711 Personal history of pulmonary embolism: Secondary | ICD-10-CM | POA: Diagnosis not present

## 2014-07-22 DIAGNOSIS — R7881 Bacteremia: Secondary | ICD-10-CM

## 2014-07-22 DIAGNOSIS — R0602 Shortness of breath: Secondary | ICD-10-CM | POA: Diagnosis not present

## 2014-07-22 LAB — COMPREHENSIVE METABOLIC PANEL
ALT: 17 U/L (ref 0–35)
AST: 38 U/L — ABNORMAL HIGH (ref 0–37)
Albumin: 4.4 g/dL (ref 3.5–5.2)
Alkaline Phosphatase: 98 U/L (ref 39–117)
Anion gap: 15 (ref 5–15)
BUN: 16 mg/dL (ref 6–23)
CO2: 27 meq/L (ref 19–32)
CREATININE: 0.44 mg/dL — AB (ref 0.50–1.10)
Calcium: 10 mg/dL (ref 8.4–10.5)
Chloride: 94 mEq/L — ABNORMAL LOW (ref 96–112)
GFR calc Af Amer: >90 mL/min (ref >90)
Glucose, Bld: 115 mg/dL — ABNORMAL HIGH (ref 70–99)
Potassium: 5.7 mEq/L — ABNORMAL HIGH (ref 3.7–5.3)
Sodium: 136 mEq/L — ABNORMAL LOW (ref 137–147)
Total Bilirubin: 0.6 mg/dL (ref 0.3–1.2)
Total Protein: 8.6 g/dL — ABNORMAL HIGH (ref 6.0–8.3)

## 2014-07-22 LAB — CBC WITH DIFFERENTIAL/PLATELET
BASOS ABS: 0 10*3/uL (ref 0.0–0.1)
Basophils Relative: 0 % (ref 0–1)
EOS PCT: 1 % (ref 0–5)
Eosinophils Absolute: 0.1 10*3/uL (ref 0.0–0.7)
HEMATOCRIT: 42.4 % (ref 36.0–46.0)
HEMOGLOBIN: 14.6 g/dL (ref 12.0–15.0)
LYMPHS PCT: 17 % (ref 12–46)
Lymphs Abs: 1.8 10*3/uL (ref 0.7–4.0)
MCH: 28.1 pg (ref 26.0–34.0)
MCHC: 34.4 g/dL (ref 30.0–36.0)
MCV: 81.7 fL (ref 78.0–100.0)
MONO ABS: 0.4 10*3/uL (ref 0.1–1.0)
MONOS PCT: 4 % (ref 3–12)
NEUTROS ABS: 8.5 10*3/uL — AB (ref 1.7–7.7)
Neutrophils Relative %: 78 % — ABNORMAL HIGH (ref 43–77)
Platelets: 206 10*3/uL (ref 150–400)
RBC: 5.19 MIL/uL — ABNORMAL HIGH (ref 3.87–5.11)
RDW: 14.1 % (ref 11.5–15.5)
WBC: 10.8 10*3/uL — ABNORMAL HIGH (ref 4.0–10.5)

## 2014-07-22 LAB — I-STAT CG4 LACTIC ACID, ED: LACTIC ACID, VENOUS: 0.8 mmol/L (ref 0.5–2.2)

## 2014-07-22 NOTE — Telephone Encounter (Addendum)
Post ED Visit - Positive Culture Follow-up: Chart Hand-off to ED Flow Manager  Culture assessed and recommendations reviewed by: []  Wes Dulaney, Pharm.D., BCPS []  Heide Guile, Pharm.D., BCPS []  Alycia Rossetti, Pharm.D., BCPS []  [place], Pharm.D., BCPS, AAHIVP []  Legrand [place]o, Pharm .D., BCPS, AAHIVP []  Hassie Bruce, Pharm.D. []  Milus Glazier, [place].D.  Positive blood culture gram + cocci pairs  []  Patient discharged without antimicrobial prescription and treatment is now indicated []  Organism is resistant to prescribed ED discharge antimicrobial [x]  Patient with positive blood cultures  Changes discussed with ED provider: taken to [facility] 07/22/14 Spoke with Vickki Hearing PA, pt called and told to return to ED for reeval   Hazle Nordmann 07/22/2014, 10:12 AM

## 2014-07-22 NOTE — ED Notes (Signed)
PT complaining of headache; states she is hungry.

## 2014-07-22 NOTE — Discharge Instructions (Signed)
The patient being treated for kidney part pneumonia with Levaquin. Referred in here today for positive blood culture. Patient workup here today shows overall significant improvement. Continue Levaquin. Followup with your record Dr. Return for any newer worse symptoms at all.

## 2014-07-22 NOTE — ED Provider Notes (Signed)
CSN: [id number]     Arrival date & time 07/22/14  4 History   First MD Initiated Contact with Patient 07/22/14 1702     Chief Complaint  Patient presents with  . Shortness of Breath  . Blood Infection     (Consider location/radiation/quality/duration/timing/severity/associated sxs/prior Treatment) Patient is a 68 y.o. female presenting with shortness of breath. The history is provided by the patient.  Shortness of Breath Associated symptoms: chest pain   Associated symptoms: no abdominal pain, no fever, no headaches, no rash and no vomiting    patient with diagnosis of community acquired pneumonia. Patient has not been in the hospital in over a year. Patient seen at [facility] long emergency department head CT angioid chest x-ray that showed opacities consistent with pneumonia. Patient received IV Zithromax. As well discharged home on Levaquin by mouth. Patient does not feel worse but does not feel significantly better. Here patient appears nontoxic. Patient is oriented. Patient still having complaints of shortness of breath and some chest wall discomfort. Patient had blood cultures done while at [facility] long period patient received a call today about one of the blood cultures being positive. Was told to go to ED.  Past Medical History  Diagnosis Date  . Asthma     extrinsic; moderate, persistant, nml spirometry 2010, nml CXR 1/08  . Pulmonary embolism     related to back surgery with prior coumadin use, now off  . DVT (deep venous thrombosis)   . PAF (paroxysmal atrial fibrillation)     not on coumadin therapy  . HLD (hyperlipidemia)   . CAD (coronary artery disease)     50% mid LAD, 80% ostial D1 and moderate 80% mid circ by vath 2003  . Antiphospholipid syndrome     with hypercoagulable state  . Coronary heart disease   . Drug allergy     heparin/lovenox  . Erosive gastritis 1994  . Anemia, iron deficiency   . HTN (hypertension)     [name] at [facility], manages pt.   .  Complication of anesthesia     cardiac arrest- in OR, at age 66 (68)y.o. during Scalenotomy   . Anxiety   . Hypothyroidism   . Bladder troubles     REPORTS INFECTIONS ON OCCASION DUE TO URETHRA MEATUS STRICTURE AT BIRTH   . H/O hiatal hernia   . Spondylosis, lumbosacral     ARTHRITIS- OA   . Cancer     basal cell removed fr. L arm   . Blood dyscrasia   . Liver spot     cyst- - no biopsy, but told that its benign   . GERD (gastroesophageal reflux disease)    Past Surgical History  Procedure Laterality Date  . Back surgery      x12 cervical and lumbar thoracic spine surgery  . Total knee arthroplasty      left  . Cleft lip and palate repair      68 yo   . Hysterectomy    . Spina bifida repair      68 yo   . Cardiac catheterization      2005  . Joint replacement    . Tonsillectomy    . Breast surgery      all benign cysts x3   . Abdominal hysterectomy      partial abdominal -1998   Family History  Problem Relation Age of Onset  . Heart disease Father     MI   . Heart disease Sister  History  Substance Use Topics  . Smoking status: Never Smoker   . Smokeless tobacco: Never Used     Comment: no smoking   . Alcohol Use: No   OB History   Grav Para Term Preterm Abortions TAB SAB Ect Mult Living                 Review of Systems  Constitutional: Positive for fatigue. Negative for fever.  HENT: Negative for congestion.   Eyes: Negative for visual disturbance.  Respiratory: Positive for shortness of breath.   Cardiovascular: Positive for chest pain.  Gastrointestinal: Negative for nausea, vomiting and abdominal pain.  Genitourinary: Negative for dysuria.  Musculoskeletal: Positive for myalgias.  Skin: Negative for rash.  Neurological: Positive for weakness. Negative for syncope and headaches.  Hematological: Does not bruise/bleed easily.  Psychiatric/Behavioral: Negative for confusion.      Allergies  Hornet venom; Pork-derived products; Amoxicillin-pot  clavulanate; Enoxaparin sodium; Heparin; Lactose intolerance (gi); Other; Povidone-iodine; Indomethacin; Phenylpropanolamine; and Povidone iodine  Home Medications   Prior to Admission medications   Medication Sig Start Date End Date Taking? Authorizing Provider  albuterol (PROVENTIL HFA;VENTOLIN HFA) 108 (90 BASE) MCG/ACT inhaler Inhale into the lungs every 6 (six) hours as needed for wheezing or shortness of breath.   Yes Historical Provider, MD  aspirin 81 MG EC tablet Take 81 mg by mouth daily.    Yes Historical Provider, MD  atorvastatin (LIPITOR) 20 MG tablet Take 20 mg by mouth daily.   Yes Historical Provider, MD  azelastine (ASTELIN) 137 MCG/SPRAY nasal spray Place 1 spray into the nose 2 (two) times daily as needed. Use in each nostril as directed For allergies   Yes Historical Provider, MD  beclomethasone (QVAR) 80 MCG/ACT inhaler Inhale 2 puffs into the lungs 2 (two) times daily. 06/24/14  Yes Elsie Stain, MD  Calcium Carbonate-Vitamin D 600-400 MG-UNIT per tablet Take 1 tablet by mouth 2 (two) times daily.     Yes Historical Provider, MD  cyclobenzaprine (FLEXERIL) 10 MG tablet Take 10 mg by mouth 3 (three) times daily as needed. For severe muscle pain   Yes Historical Provider, MD  diazepam (VALIUM) 5 MG tablet Take 5 mg by mouth at bedtime as needed for anxiety or sedation.    Yes Historical Provider, MD  docusate sodium (COLACE) 100 MG capsule Take 100 mg by mouth 2 (two) times daily.    Yes Historical Provider, MD  esomeprazole (NEXIUM) 40 MG capsule Take 40 mg by mouth 2 (two) times daily.    Yes Historical Provider, MD  fentaNYL (DURAGESIC - DOSED MCG/HR) 75 MCG/HR Place 75 mcg onto the skin See admin instructions. Change every 2 days.   Yes Historical Provider, MD  gabapentin (NEURONTIN) 600 MG tablet Take 600 mg by mouth at bedtime.    Yes Historical Provider, MD  Glucosamine HCl (GLUCOSAMINE PO) Take 2 tablets by mouth 2 (two) times daily. Glucosamine ASU   Yes Historical  Provider, MD  HYDROmorphone (DILAUDID) 4 MG tablet Take 4-8 mg by mouth every 4 (four) hours as needed. For pain   Yes Historical Provider, MD  levofloxacin (LEVAQUIN) 750 MG tablet Take 1 tablet (750 mg total) by mouth daily. 07/20/14  Yes Kristen N Ward, DO  levothyroxine (SYNTHROID, LEVOTHROID) 75 MCG tablet Take 75 mcg by mouth daily before breakfast.    Yes Historical Provider, MD  lidocaine (LIDODERM) 5 % Place 1 patch onto the skin as needed (pain). Remove & Discard patch within 12 hours  or as directed by MD   Yes Historical Provider, MD  Loratadine-Pseudoephedrine (CLARITIN-D 12 HOUR PO) Take 1 tablet by mouth 2 (two) times daily.    Yes Historical Provider, MD  metaxalone (SKELAXIN) 800 MG tablet Take 800 mg by mouth every 8 (eight) hours as needed. For muscle pain   Yes Historical Provider, MD  metoprolol (LOPRESSOR) 50 MG tablet Take 1 tablet (50 mg total) by mouth 2 (two) times daily. 02/23/14  Yes Burnell Blanks, MD  montelukast (SINGULAIR) 10 MG tablet Take 10 mg by mouth at bedtime.    Yes Historical Provider, MD  Multiple Vitamin (MULTIVITAMIN WITH MINERALS) TABS Take 1 tablet by mouth daily.   Yes Historical Provider, MD  triamcinolone (NASACORT) 55 MCG/ACT nasal inhaler Place 1-2 sprays into the nose 2 (two) times daily.    Yes Historical Provider, MD  EPINEPHrine (EPIPEN) 0.3 mg/0.3 mL DEVI Inject 0.3 mLs (0.3 mg total) into the muscle once. 04/12/13   [name] Rama, MD  nitroGLYCERIN (NITROSTAT) 0.4 MG SL tablet Place 1 tablet (0.4 mg total) under the tongue every 5 (five) minutes as needed. For chest pain 09/01/13   Burnell Blanks, MD  Respiratory Therapy Supplies (FLUTTER) DEVI Use twice daily as needed for respiratory. 06/24/14   Elsie Stain, MD   BP 133/85  Pulse 92  Temp(Src) 97.6 F (36.4 C) (Oral)  Resp 19  SpO2 99% Physical Exam  Nursing note and vitals reviewed. Constitutional: She is oriented to person, place, and time. She appears well-developed  and well-nourished. No distress.  HENT:  Head: Normocephalic and atraumatic.  Mouth/Throat: Oropharynx is clear and moist.  Eyes: Conjunctivae and EOM are normal. Pupils are equal, round, and reactive to light.  Neck: Normal range of motion. Neck supple.  Cardiovascular: Normal rate, regular rhythm and normal heart sounds.   No murmur heard. Pulmonary/Chest: Effort normal and breath sounds normal. No respiratory distress.  Abdominal: Soft. Bowel sounds are normal. There is no tenderness.  Musculoskeletal: Normal range of motion.  Neurological: She is alert and oriented to person, place, and time. No cranial nerve deficit. She exhibits normal muscle tone. Coordination normal.  Skin: Skin is warm. No rash noted.    ED Course  Procedures (including critical care time) Labs Review Labs Reviewed  CBC WITH DIFFERENTIAL - Abnormal; Notable for the following:    WBC 10.8 (*)    RBC 5.19 (*)    Neutrophils Relative % 78 (*)    Neutro Abs 8.5 (*)    All other components within normal limits  COMPREHENSIVE METABOLIC PANEL - Abnormal; Notable for the following:    Sodium 136 (*)    Potassium 5.7 (*)    Chloride 94 (*)    Glucose, Bld 115 (*)    Creatinine, Ser 0.44 (*)    Total Protein 8.6 (*)    AST 38 (*)    All other components within normal limits  I-STAT CG4 LACTIC ACID, ED   Results for orders placed during the hospital encounter of 07/22/14  CBC WITH DIFFERENTIAL      Result Value Ref Range   WBC 10.8 (*) 4.0 - 10.5 K/uL   RBC 5.19 (*) 3.87 - 5.11 MIL/uL   Hemoglobin 14.6  12.0 - 15.0 g/dL   HCT 42.4  36.0 - 46.0 %   MCV 81.7  78.0 - 100.0 fL   MCH 28.1  26.0 - 34.0 pg   MCHC 34.4  30.0 - 36.0 g/dL   RDW  14.1  11.5 - 15.5 %   Platelets 206  150 - 400 K/uL   Neutrophils Relative % 78 (*) 43 - 77 %   Neutro Abs 8.5 (*) 1.7 - 7.7 K/uL   Lymphocytes Relative 17  12 - 46 %   Lymphs Abs 1.8  0.7 - 4.0 K/uL   Monocytes Relative 4  3 - 12 %   Monocytes Absolute 0.4  0.1 - 1.0  K/uL   Eosinophils Relative 1  0 - 5 %   Eosinophils Absolute 0.1  0.0 - 0.7 K/uL   Basophils Relative 0  0 - 1 %   Basophils Absolute 0.0  0.0 - 0.1 K/uL  COMPREHENSIVE METABOLIC PANEL      Result Value Ref Range   Sodium 136 (*) 137 - 147 mEq/L   Potassium 5.7 (*) 3.7 - 5.3 mEq/L   Chloride 94 (*) 96 - 112 mEq/L   CO2 27  19 - 32 mEq/L   Glucose, Bld 115 (*) 70 - 99 mg/dL   BUN 16  6 - 23 mg/dL   Creatinine, Ser 0.44 (*) 0.50 - 1.10 mg/dL   Calcium 10.0  8.4 - 10.5 mg/dL   Total Protein 8.6 (*) 6.0 - 8.3 g/dL   Albumin 4.4  3.5 - 5.2 g/dL   AST 38 (*) 0 - 37 U/L   ALT 17  0 - 35 U/L   Alkaline Phosphatase 98  39 - 117 U/L   Total Bilirubin 0.6  0.3 - 1.2 mg/dL   GFR calc non Af Amer >90  >90 mL/min   GFR calc Af Amer >90  >90 mL/min   Anion gap 15  5 - 15  I-STAT CG4 LACTIC ACID, ED      Result Value Ref Range   Lactic Acid, Venous 0.80  0.5 - 2.2 mmol/L     Imaging Review Dg Chest 2 View  07/22/2014   CLINICAL DATA:  Shortness of breath.  EXAM: CHEST  2 VIEW  COMPARISON:  07/20/2014.  FINDINGS: Normal sized heart. No significant change in linear density in the right lower lung zone. The previously seen right pleural fluid is no longer demonstrated. Clear left lung. Thoracolumbar and cervical spine fixation hardware.  IMPRESSION: 1. No acute abnormality. 2. Stable linear atelectasis or scarring on the right. 3. Resolved right pleural effusion.   Electronically Signed   By: Enrique Sack M.D.   On: 07/22/2014 19:29     EKG Interpretation None      MDM   Final diagnoses:  CAP (community acquired pneumonia)  Positive blood culture    The patient called in for a positive blood culture that was drawn at [place] [facility] on September 29. Blood culture results reviewed gram-positive cocci in pairs. On one of the cultures the other culture hasn't grown anything to date. Patient states she does not feel worse but she doesn't feel any better. Patient was given IV Zithromax while at  [place] [facility] and then discharged home on Levaquin. Patient also CT angios chest x-ray which both showed opacity consistent with pneumonia. Patient denies any fevers. Patient denies any near syncope. Patient here appears nontoxic no acute distress.  Workup here chest x-ray shows improvement patient's lactic acid level is elevated at all. Patient's white blood cell count is decreasing 22,000 down to 10,000 from where was just 2 days ago. Overall patient seems to be improving. No evidence of any sepsis. Not tachycardic not hypotensive afebrile. Patient will be continued on the  Levaquin with per cautioned. Patient will followup with her doctor.    Fredia Sorrow, MD 07/22/14 2039

## 2014-07-22 NOTE — ED Notes (Signed)
PT ambulated with baseline gait; VSS; A&Ox3; no signs of distress; respirations even and unlabored; skin warm and dry; no questions upon discharge.  

## 2014-07-22 NOTE — ED Notes (Signed)
6th or 7th time since march 2015 pts. Been having sob and rib pain. [place] - 929/2015: PNA. Now, she has Infection in her blood stream.

## 2014-07-24 ENCOUNTER — Telehealth (HOSPITAL_COMMUNITY): Payer: Self-pay

## 2014-07-24 LAB — CULTURE, BLOOD (ROUTINE X 2)

## 2014-07-24 NOTE — Progress Notes (Signed)
ED Antimicrobial Stewardship Positive Culture Follow Up   Latoya [name] is an 68 y.o. female who presented to [facility] on 07/20/2014 with a chief complaint of  Chief Complaint  Patient presents with  . Chest Pain    Recent Results (from the past 720 hour(s))  CULTURE, BLOOD (ROUTINE X 2)     Status: None   Collection Time    07/20/14  5:59 PM      Result Value Ref Range Status   Specimen Description BLOOD LEFT ANTECUBITAL   Final   Special Requests BOTTLES DRAWN AEROBIC AND ANAEROBIC 10CC   Final   Culture  Setup Time     Final   Value: 07/20/2014 22:30     Performed at [facility]   Culture     Final   Value: VIRIDANS STREPTOCOCCUS     Note: THE SIGNIFICANCE OF ISOLATING THIS ORGANISM FROM A SINGLE SET OF BLOOD CULTURES WHEN MULTIPLE SETS ARE DRAWN IS UNCERTAIN. PLEASE NOTIFY THE MICROBIOLOGY DEPARTMENT WITHIN ONE WEEK IF SPECIATION AND SENSITIVITIES ARE REQUIRED.     Note: Gram Stain Report Called to,Read Back By and Verified With: LYNN MILLER @ [phone] ON [id number] BY PEAKY     Performed at [facility]   Report Status 07/24/2014 FINAL   Final  CULTURE, BLOOD (ROUTINE X 2)     Status: None   Collection Time    07/20/14  7:31 PM      Result Value Ref Range Status   Specimen Description BLOOD LEFT HAND   Final   Special Requests BOTTLES DRAWN AEROBIC ONLY 3 ML   Final   Culture  Setup Time     Final   Value: 07/21/2014 00:45     Performed at [facility]   Culture     Final   Value:        BLOOD CULTURE RECEIVED NO GROWTH TO DATE CULTURE WILL BE HELD FOR 5 DAYS BEFORE ISSUING A FINAL NEGATIVE REPORT     Performed at [facility]   Report Status PENDING   Incomplete    [x]  Treated with levaquin x7 days, but LOT needs to be longer since pt is refusing admission.  []  Patient discharged originally without antimicrobial agent and treatment is now indicated  New antibiotic prescription: levaquin 750mg  PO daily x a total of 10 days  ED Provider:  Michele Mcalpine PA  [place], Rande Lawman 07/24/2014, 1:57 PM Clinical Pharmacist Phone# [phone]

## 2014-07-24 NOTE — ED Notes (Signed)
Post ED Visit - Positive Culture Follow-up: Successful Patient Follow-Up  Culture assessed and recommendations reviewed by: []  [place], Pharm.D., BCPS []  Heide Guile, Pharm.D., BCPS []  Alycia Rossetti, Pharm.D., BCPS []  [place], Pharm.D., BCPS, AAHIVP []  Legrand Como, Pharm.D., BCPS, AAHIVP []  Hassie Bruce, Pharm.D. []  Milus Glazier, Pharm.Glori Luis Rumbarger   Positive blood culture culture  []  Patient discharged without antimicrobial prescription and treatment is now indicated []  Organism is resistant to prescribed ED discharge antimicrobial [x]  Patient with positive blood cultures  Changes discussed with ED provider: R.albert New antibiotic prescription need to add 3 days of levofloxacin to current prescription   Called to unk  Contacted patient, date will continue to try to get intouch with pt, time 1424   Ileene Musa 07/24/2014, 2:41 PM

## 2014-07-25 ENCOUNTER — Telehealth (HOSPITAL_BASED_OUTPATIENT_CLINIC_OR_DEPARTMENT_OTHER): Payer: Self-pay | Admitting: Emergency Medicine

## 2014-07-26 DIAGNOSIS — J4530 Mild persistent asthma, uncomplicated: Secondary | ICD-10-CM | POA: Diagnosis not present

## 2014-07-26 DIAGNOSIS — Z91030 Bee allergy status: Secondary | ICD-10-CM | POA: Diagnosis not present

## 2014-07-26 DIAGNOSIS — J309 Allergic rhinitis, unspecified: Secondary | ICD-10-CM | POA: Diagnosis not present

## 2014-07-26 DIAGNOSIS — J159 Unspecified bacterial pneumonia: Secondary | ICD-10-CM | POA: Diagnosis not present

## 2014-07-27 ENCOUNTER — Inpatient Hospital Stay (HOSPITAL_COMMUNITY): Admission: RE | Admit: 2014-07-27 | Payer: Medicare Other | Source: Ambulatory Visit

## 2014-07-27 LAB — CULTURE, BLOOD (ROUTINE X 2): CULTURE: NO GROWTH

## 2014-07-29 DIAGNOSIS — M489 Spondylopathy, unspecified: Secondary | ICD-10-CM | POA: Diagnosis not present

## 2014-07-29 DIAGNOSIS — J159 Unspecified bacterial pneumonia: Secondary | ICD-10-CM | POA: Diagnosis not present

## 2014-07-29 DIAGNOSIS — J45909 Unspecified asthma, uncomplicated: Secondary | ICD-10-CM | POA: Diagnosis not present

## 2014-07-30 DIAGNOSIS — M960 Pseudarthrosis after fusion or arthrodesis: Secondary | ICD-10-CM | POA: Diagnosis not present

## 2014-07-30 DIAGNOSIS — M4722 Other spondylosis with radiculopathy, cervical region: Secondary | ICD-10-CM | POA: Diagnosis not present

## 2014-07-30 DIAGNOSIS — M963 Postlaminectomy kyphosis: Secondary | ICD-10-CM | POA: Diagnosis not present

## 2014-07-30 DIAGNOSIS — G894 Chronic pain syndrome: Secondary | ICD-10-CM | POA: Diagnosis not present

## 2014-08-09 ENCOUNTER — Encounter: Payer: Self-pay | Admitting: Critical Care Medicine

## 2014-08-09 ENCOUNTER — Ambulatory Visit (INDEPENDENT_AMBULATORY_CARE_PROVIDER_SITE_OTHER): Payer: Medicare Other | Admitting: Critical Care Medicine

## 2014-08-09 VITALS — BP 156/84 | HR 75 | Temp 97.8°F | Ht 62.0 in | Wt 166.4 lb

## 2014-08-09 DIAGNOSIS — I2510 Atherosclerotic heart disease of native coronary artery without angina pectoris: Secondary | ICD-10-CM | POA: Diagnosis not present

## 2014-08-09 DIAGNOSIS — Z23 Encounter for immunization: Secondary | ICD-10-CM

## 2014-08-09 DIAGNOSIS — J189 Pneumonia, unspecified organism: Secondary | ICD-10-CM | POA: Diagnosis not present

## 2014-08-09 DIAGNOSIS — J45909 Unspecified asthma, uncomplicated: Secondary | ICD-10-CM | POA: Diagnosis not present

## 2014-08-09 NOTE — Assessment & Plan Note (Signed)
Recent upper lobe pneumonia, now resolved The pt can be cleared for planned cervical spinal surgery

## 2014-08-09 NOTE — Patient Instructions (Signed)
No change in medications You are clear for planned cervical surgery from lung standpoint

## 2014-08-09 NOTE — Assessment & Plan Note (Signed)
Stable persistent moderate asthma Plan Cont qvar two puff bid 80 mcg strength

## 2014-08-09 NOTE — Progress Notes (Signed)
Subjective:    Patient ID: Latoya [name], female    DOB: 1946/03/18, 68 y.o.   MRN: [id number]  HPI  08/09/2014 Chief Complaint  Patient presents with  . Follow-up    For surgery clearance.  Has neck surgery scheduled for Nov 10 with Dr. Vertell Limber.  Dx with pna since last OV.  SOB has improved.  No cough or chest tightness/pain.  Pt dx with PNA sept 29, noted more dyspnea.  [facility] ED:  RUL PNA    BC pos strep viridans 1/2  prob contam WBC 22>>10 and CXR better in 3 days, Levaquin 750/d x 10days.  Now off abx. Now is better. No real cough. No fever. reschedule to nov 10.  Preop CXR is pending Nov 2nd. For CXR.   Plan for posterior approach.    Review of Systems  Constitutional:   No  weight loss, night sweats,  Fevers, chills, fatigue, lassitude. HEENT:   No headaches,  Difficulty swallowing,  Tooth/dental problems,  Sore throat,                No sneezing, itching, ear ache, nasal congestion, post nasal drip,   CV:  No chest pain,  Orthopnea, PND, swelling in lower extremities, anasarca, dizziness, palpitations  GI  No heartburn, indigestion, abdominal pain, nausea, vomiting, diarrhea, change in bowel habits, loss of appetite  Resp: Notes  shortness of breath with exertion not  at rest.  No excess mucus, no productive cough,  No non-productive cough,  No coughing up of blood.  No change in color of mucus.  No wheezing.  No chest wall deformity  Skin: no rash or lesions.  GU: no dysuria, change in color of urine, no urgency or frequency.  No flank pain.  MS:  No joint pain or swelling.  No decreased range of motion.  No back pain.  Psych:  No change in mood or affect. No depression or anxiety.  No memory loss.     Objective:   Physical Exam  Filed Vitals:   08/09/14 0933  BP: 156/84  Pulse: 75  Temp: 97.8 F (36.6 C)  TempSrc: Oral  Height: 5\' 2"  (1.575 m)  Weight: 166 lb 6.4 oz (75.479 kg)  SpO2: 97%    Gen: Pleasant, well-nourished, in no distress,  normal  affect  ENT: No lesions,  mouth clear,  oropharynx clear, no postnasal drip  Neck: No JVD, no TMG, no carotid bruits  Lungs: No use of accessory muscles, no dullness to percussion, clear without rales or rhonchi  Cardiovascular: RRR, heart sounds normal, no murmur or gallops, no peripheral edema  Abdomen: soft and NT, no HSM,  BS normal  Musculoskeletal: No deformities, no cyanosis or clubbing, kyphotic spinal deformity  Neuro: alert, non focal  Skin: Warm, no lesions or rashes  No results found.     Assessment & Plan:   CAP (community acquired pneumonia) Recent upper lobe pneumonia, now resolved The pt can be cleared for planned cervical spinal surgery  Asthma Stable persistent moderate asthma Plan Cont qvar two puff bid 80 mcg strength    Updated Medication List Outpatient Encounter Prescriptions as of 08/09/2014  Medication Sig  . albuterol (PROVENTIL HFA;VENTOLIN HFA) 108 (90 BASE) MCG/ACT inhaler Inhale into the lungs every 6 (six) hours as needed for wheezing or shortness of breath.  Marland Kitchen aspirin 81 MG EC tablet Take 81 mg by mouth daily.   Marland Kitchen atorvastatin (LIPITOR) 20 MG tablet Take 20 mg by mouth daily.  Marland Kitchen  azelastine (ASTELIN) 137 MCG/SPRAY nasal spray Place 1 spray into the nose 3 (three) times daily as needed. Use in each nostril as directed For allergies  . beclomethasone (QVAR) 80 MCG/ACT inhaler Inhale 2 puffs into the lungs 2 (two) times daily.  . Calcium Carbonate-Vitamin D 600-400 MG-UNIT per tablet Take 1 tablet by mouth 2 (two) times daily.    . cyclobenzaprine (FLEXERIL) 10 MG tablet Take 10 mg by mouth 3 (three) times daily as needed. For severe muscle pain  . diazepam (VALIUM) 5 MG tablet Take 5 mg by mouth at bedtime as needed for anxiety or sedation.   . docusate sodium (COLACE) 100 MG capsule Take 100 mg by mouth 2 (two) times daily.   Marland Kitchen EPINEPHrine (EPIPEN) 0.3 mg/0.3 mL DEVI Inject 0.3 mLs (0.3 mg total) into the muscle once.  Marland Kitchen esomeprazole  (NEXIUM) 40 MG capsule Take 40 mg by mouth 2 (two) times daily.   . fentaNYL (DURAGESIC - DOSED MCG/HR) 75 MCG/HR Place 75 mcg onto the skin See admin instructions. Change every 2 days.  Marland Kitchen gabapentin (NEURONTIN) 600 MG tablet Take 600 mg by mouth at bedtime.   . Glucosamine HCl (GLUCOSAMINE PO) Take 2 tablets by mouth 2 (two) times daily. Glucosamine ASU  . HYDROmorphone (DILAUDID) 4 MG tablet Take 4-8 mg by mouth every 4 (four) hours as needed. For pain  . levothyroxine (SYNTHROID, LEVOTHROID) 75 MCG tablet Take 75 mcg by mouth daily before breakfast.   . Loratadine-Pseudoephedrine (CLARITIN-D 12 HOUR PO) Take 1 tablet by mouth 2 (two) times daily.   . metaxalone (SKELAXIN) 800 MG tablet Take 800 mg by mouth every 8 (eight) hours as needed. For muscle pain  . metoprolol (LOPRESSOR) 50 MG tablet Take 1 tablet (50 mg total) by mouth 2 (two) times daily.  . montelukast (SINGULAIR) 10 MG tablet Take 10 mg by mouth at bedtime.   . Multiple Vitamin (MULTIVITAMIN WITH MINERALS) TABS Take 1 tablet by mouth daily.  . nitroGLYCERIN (NITROSTAT) 0.4 MG SL tablet Place 1 tablet (0.4 mg total) under the tongue every 5 (five) minutes as needed. For chest pain  . Respiratory Therapy Supplies (FLUTTER) DEVI Use twice daily as needed for respiratory.  . triamcinolone (NASACORT) 55 MCG/ACT nasal inhaler Place 1-2 sprays into the nose 2 (two) times daily.   Marland Kitchen lidocaine (LIDODERM) 5 % Place 1 patch onto the skin as needed (pain). Remove & Discard patch within 12 hours or as directed by MD  . [DISCONTINUED] levofloxacin (LEVAQUIN) 750 MG tablet Take 1 tablet (750 mg total) by mouth daily.

## 2014-08-18 DIAGNOSIS — M5412 Radiculopathy, cervical region: Secondary | ICD-10-CM | POA: Diagnosis not present

## 2014-08-18 DIAGNOSIS — D6861 Antiphospholipid syndrome: Secondary | ICD-10-CM | POA: Diagnosis not present

## 2014-08-18 DIAGNOSIS — M5023 Other cervical disc displacement, cervicothoracic region: Secondary | ICD-10-CM | POA: Diagnosis not present

## 2014-08-18 DIAGNOSIS — M542 Cervicalgia: Secondary | ICD-10-CM | POA: Diagnosis not present

## 2014-08-23 ENCOUNTER — Encounter (HOSPITAL_COMMUNITY)
Admission: RE | Admit: 2014-08-23 | Discharge: 2014-08-23 | Disposition: A | Discharge status: Home / Self Care | Payer: Medicare Other | Source: Ambulatory Visit | Attending: Neurosurgery | Admitting: Neurosurgery

## 2014-08-23 ENCOUNTER — Encounter (HOSPITAL_COMMUNITY): Payer: Self-pay

## 2014-08-23 ENCOUNTER — Encounter (HOSPITAL_COMMUNITY)
Admission: RE | Admit: 2014-08-23 | Discharge: 2014-08-23 | Disposition: A | Discharge status: Home / Self Care | Payer: Medicare Other | Source: Ambulatory Visit | Attending: Anesthesiology | Admitting: Anesthesiology

## 2014-08-23 DIAGNOSIS — Z86718 Personal history of other venous thrombosis and embolism: Secondary | ICD-10-CM | POA: Insufficient documentation

## 2014-08-23 DIAGNOSIS — I2510 Atherosclerotic heart disease of native coronary artery without angina pectoris: Secondary | ICD-10-CM | POA: Diagnosis not present

## 2014-08-23 DIAGNOSIS — I480 Paroxysmal atrial fibrillation: Secondary | ICD-10-CM | POA: Diagnosis not present

## 2014-08-23 DIAGNOSIS — Q059 Spina bifida, unspecified: Secondary | ICD-10-CM | POA: Diagnosis not present

## 2014-08-23 DIAGNOSIS — I7300 Raynaud's syndrome without gangrene: Secondary | ICD-10-CM | POA: Insufficient documentation

## 2014-08-23 DIAGNOSIS — I10 Essential (primary) hypertension: Secondary | ICD-10-CM | POA: Insufficient documentation

## 2014-08-23 DIAGNOSIS — J984 Other disorders of lung: Secondary | ICD-10-CM | POA: Diagnosis not present

## 2014-08-23 DIAGNOSIS — I6523 Occlusion and stenosis of bilateral carotid arteries: Secondary | ICD-10-CM | POA: Diagnosis not present

## 2014-08-23 DIAGNOSIS — D6861 Antiphospholipid syndrome: Secondary | ICD-10-CM | POA: Insufficient documentation

## 2014-08-23 DIAGNOSIS — Z8674 Personal history of sudden cardiac arrest: Secondary | ICD-10-CM | POA: Diagnosis not present

## 2014-08-23 DIAGNOSIS — J9811 Atelectasis: Secondary | ICD-10-CM | POA: Insufficient documentation

## 2014-08-23 DIAGNOSIS — J45909 Unspecified asthma, uncomplicated: Secondary | ICD-10-CM | POA: Insufficient documentation

## 2014-08-23 DIAGNOSIS — Z87891 Personal history of nicotine dependence: Secondary | ICD-10-CM | POA: Diagnosis not present

## 2014-08-23 DIAGNOSIS — Z86711 Personal history of pulmonary embolism: Secondary | ICD-10-CM | POA: Diagnosis not present

## 2014-08-23 DIAGNOSIS — Z888 Allergy status to other drugs, medicaments and biological substances status: Secondary | ICD-10-CM | POA: Diagnosis not present

## 2014-08-23 DIAGNOSIS — Z01818 Encounter for other preprocedural examination: Secondary | ICD-10-CM | POA: Insufficient documentation

## 2014-08-23 DIAGNOSIS — E785 Hyperlipidemia, unspecified: Secondary | ICD-10-CM | POA: Diagnosis not present

## 2014-08-23 DIAGNOSIS — F419 Anxiety disorder, unspecified: Secondary | ICD-10-CM | POA: Diagnosis not present

## 2014-08-23 DIAGNOSIS — E039 Hypothyroidism, unspecified: Secondary | ICD-10-CM | POA: Diagnosis not present

## 2014-08-23 DIAGNOSIS — K449 Diaphragmatic hernia without obstruction or gangrene: Secondary | ICD-10-CM | POA: Insufficient documentation

## 2014-08-23 DIAGNOSIS — K219 Gastro-esophageal reflux disease without esophagitis: Secondary | ICD-10-CM | POA: Insufficient documentation

## 2014-08-23 DIAGNOSIS — D509 Iron deficiency anemia, unspecified: Secondary | ICD-10-CM | POA: Insufficient documentation

## 2014-08-23 DIAGNOSIS — Z90710 Acquired absence of both cervix and uterus: Secondary | ICD-10-CM | POA: Insufficient documentation

## 2014-08-23 DIAGNOSIS — Q379 Unspecified cleft palate with unilateral cleft lip: Secondary | ICD-10-CM | POA: Insufficient documentation

## 2014-08-23 DIAGNOSIS — Z981 Arthrodesis status: Secondary | ICD-10-CM | POA: Diagnosis not present

## 2014-08-23 HISTORY — DX: Family history of other specified conditions: Z84.89

## 2014-08-23 LAB — CBC
HCT: 45.7 % (ref 36.0–46.0)
Hemoglobin: 15.3 g/dL — ABNORMAL HIGH (ref 12.0–15.0)
MCH: 28.1 pg (ref 26.0–34.0)
MCHC: 33.5 g/dL (ref 30.0–36.0)
MCV: 84 fL (ref 78.0–100.0)
PLATELETS: 231 10*3/uL (ref 150–400)
RBC: 5.44 MIL/uL — AB (ref 3.87–5.11)
RDW: 14.8 % (ref 11.5–15.5)
WBC: 7.8 10*3/uL (ref 4.0–10.5)

## 2014-08-23 LAB — BASIC METABOLIC PANEL
ANION GAP: 14 (ref 5–15)
BUN: 26 mg/dL — ABNORMAL HIGH (ref 6–23)
CALCIUM: 10.3 mg/dL (ref 8.4–10.5)
CO2: 29 meq/L (ref 19–32)
CREATININE: 0.53 mg/dL (ref 0.50–1.10)
Chloride: 95 mEq/L — ABNORMAL LOW (ref 96–112)
GFR calc Af Amer: >90 mL/min (ref >90)
GFR calc non Af Amer: >90 mL/min (ref >90)
Glucose, Bld: 107 mg/dL — ABNORMAL HIGH (ref 70–99)
Potassium: 4.6 mEq/L (ref 3.7–5.3)
SODIUM: 138 meq/L (ref 137–147)

## 2014-08-23 LAB — SURGICAL PCR SCREEN
MRSA, PCR: NEGATIVE
Staphylococcus aureus: NEGATIVE

## 2014-08-23 NOTE — Progress Notes (Addendum)
Patient stated she is somewhat hard to intubate.  Said they had a little bit of hard time @ [facility].   Have called Willeen Cass, NP to interview.  DA She also states she cannot use the chlorhexadine.  Will make her itch.  I have advised her to use Dial antibacterial soap.   DA

## 2014-08-23 NOTE — Progress Notes (Signed)
Anesthesia Chart Review:  Patient is a 68 year old female scheduled for C6-7 foraminotomy with posterior cervical fusion/lateral mass screws/may place screws from C5-T1 by Dr. Vertell Limber on 08/31/2014. History includes former smoker, asthma, antiphospholipid syndrome with history of PE/DVT (previously treated with Coumadin with allergies to heparin and Lovenox), paroxysmal atrial fibrillation, hyperlipidemia, CAD, iron deficiency anemia, anxiety, GERD, hiatal hernia, Raynaud's phenomenon, hypothyroidism, hypertension, spina bifida repair, history of cleft lip and palate repair, hysterectomy, tonsillectomy, prior joint, back, and breast surgeries. She reported an anesthesia history of "cardiac arrest" during scalenotomy in 73 at the age of 68 years old. She has since undergone multiple operative procedures.  Called to see pt because she was concerned about being intubated for surgery. Reports having a small mouth and prior cervical fusion was broken during intubation process for surgery in 1998. Pt has had several surgeries without complication since then. In [facility], records show pt was intubated with video laryngoscope 09/30/12 but no anesthesia complications were noted.   Pt dx with pneumonia 07/22/2014 and has since finished course of Levaquin. Pt denies any fever, cough or SOB today. Pt's pulmonologist Dr. Joya Gaskins last saw pt on 08/09/2014 and notes indicate pneumonia is resolved and pt is cleared for surgery.   Her Cardiologist is Dr. Angelena Form. Patient had a stress test in 2013 (see below) showing no obvious ischemia.  Dr. Angelena Form cleared pt for surgery in telephone note in [facility] dated 06/22/2014.   EKG on 08/01/12 showed ST @ 102 bpm.  Nuclear stress test on 08/13/12 showed: Apical thinning no obvious ischemia Lateral wall not interpretable due to a large area of bowel artifact on both resting and stress images LV Ejection Fraction: 47%. LV Wall Motion: Apical hypokinesis.  Echo on 04/12/2013  showed: - Left ventricle: The cavity size was normal. Systolic function was normal. The estimated ejection fraction was in the range of 50% to 55%.   Cardiac cath on 09/22/02 showed: EF 70%, left main 30% at the ostium, mid LAD 40-50 beyond the diagonal takeoff,diagonal 80% at the ostium extending into the mid portion, large circumflex 70-80% prior to the bifurcation of the marginal, RCA with mild luminal irregularities. According to Dr. Camillia Herter 08/01/12 office note, "There was discussion about bypass but ultimately medical management was pursued."  Carotid duplex on 12/18/2013 showed 40-59% bilateral ICA stenosis. One year follow-up was recommended.  Chest x-ray on 08/23/2014.  1. No active cardiopulmonary disease. 2. Stable scarring or atelectasis at the right base.  Preoperative labs reviewed.  Patient has been cleared by her cardiologist and pulmonologist. If no significant change in her status then anticipate she can proceed as planned.  Willeen Cass, FNP-BC [facility]/Anesthesiology Phone: [phone] 08/25/2014 1:27 PM

## 2014-08-23 NOTE — Pre-Procedure Instructions (Signed)
Latoya [name]  08/23/2014   Your procedure is scheduled on: Tuesday, Nov. 10th.   Report to [facility] Admitting at 5:30 AM.  Call this number if you have problems the morning of surgery: [phone]   Remember:   Do not eat food or drink liquids after midnight Monday.   Take these medicines the morning of surgery with A SIP OF WATER: Nexium, Gabapentin, Hydromorphone, Metoprolol, Levothyroxine.  Please use inhaler, nasacort,    Do not wear jewelry -no rings or watches.  Do not wear lotions or colognes. You may NOT  wear deodorant the day of surgery.   Men may shave face and neck.   Do not bring valuables to the hospital.  [facility] is not responsible for any belongings or valuables.               Contacts, dentures or bridgework may not be worn into surgery.  Leave suitcase in the car. After surgery it may be brought to your room.  For patients admitted to the hospital, discharge time is determined by your treatment team.    Name and phone number of your driver:    Special Instructions: "Preparing for Surgery" instruction sheet.   Please read over the following fact sheets that you were given: Pain Booklet, MRSA Information and Surgical Site Infection Prevention

## 2014-08-27 DIAGNOSIS — M4722 Other spondylosis with radiculopathy, cervical region: Secondary | ICD-10-CM | POA: Diagnosis not present

## 2014-08-27 DIAGNOSIS — M960 Pseudarthrosis after fusion or arthrodesis: Secondary | ICD-10-CM | POA: Diagnosis not present

## 2014-08-27 DIAGNOSIS — G894 Chronic pain syndrome: Secondary | ICD-10-CM | POA: Diagnosis not present

## 2014-08-27 DIAGNOSIS — M963 Postlaminectomy kyphosis: Secondary | ICD-10-CM | POA: Diagnosis not present

## 2014-08-30 MED ORDER — VANCOMYCIN HCL IN DEXTROSE 1-5 GM/200ML-% IV SOLN
1000.0000 mg | INTRAVENOUS | Status: DC
  Filled 2014-08-30: qty 200

## 2014-08-31 ENCOUNTER — Encounter (HOSPITAL_COMMUNITY)
Admission: RE | Disposition: A | Discharge status: Home Health | Payer: Self-pay | Source: Ambulatory Visit | Attending: Neurosurgery

## 2014-08-31 ENCOUNTER — Inpatient Hospital Stay (HOSPITAL_COMMUNITY)
Admission: RE | Admit: 2014-08-31 | Discharge: 2014-09-01 | DRG: 473 | Disposition: A | Discharge status: Home Health | Payer: Medicare Other | Source: Ambulatory Visit | Attending: Neurosurgery | Admitting: Neurosurgery

## 2014-08-31 ENCOUNTER — Inpatient Hospital Stay (HOSPITAL_COMMUNITY): Payer: Medicare Other | Admitting: Emergency Medicine

## 2014-08-31 ENCOUNTER — Encounter (HOSPITAL_COMMUNITY): Payer: Self-pay | Admitting: *Deleted

## 2014-08-31 ENCOUNTER — Inpatient Hospital Stay (HOSPITAL_COMMUNITY): Payer: Medicare Other

## 2014-08-31 ENCOUNTER — Inpatient Hospital Stay (HOSPITAL_COMMUNITY): Payer: Medicare Other | Admitting: Anesthesiology

## 2014-08-31 DIAGNOSIS — M4183 Other forms of scoliosis, cervicothoracic region: Secondary | ICD-10-CM | POA: Diagnosis present

## 2014-08-31 DIAGNOSIS — M960 Pseudarthrosis after fusion or arthrodesis: Principal | ICD-10-CM | POA: Diagnosis present

## 2014-08-31 DIAGNOSIS — Y838 Other surgical procedures as the cause of abnormal reaction of the patient, or of later complication, without mention of misadventure at the time of the procedure: Secondary | ICD-10-CM | POA: Diagnosis present

## 2014-08-31 DIAGNOSIS — G8929 Other chronic pain: Secondary | ICD-10-CM | POA: Diagnosis present

## 2014-08-31 DIAGNOSIS — M5412 Radiculopathy, cervical region: Secondary | ICD-10-CM | POA: Diagnosis not present

## 2014-08-31 DIAGNOSIS — Z79899 Other long term (current) drug therapy: Secondary | ICD-10-CM

## 2014-08-31 DIAGNOSIS — M542 Cervicalgia: Secondary | ICD-10-CM | POA: Diagnosis not present

## 2014-08-31 DIAGNOSIS — Z7982 Long term (current) use of aspirin: Secondary | ICD-10-CM

## 2014-08-31 DIAGNOSIS — S129XXA Fracture of neck, unspecified, initial encounter: Secondary | ICD-10-CM | POA: Diagnosis present

## 2014-08-31 DIAGNOSIS — Z4889 Encounter for other specified surgical aftercare: Secondary | ICD-10-CM | POA: Diagnosis not present

## 2014-08-31 HISTORY — PX: POSTERIOR CERVICAL FUSION/FORAMINOTOMY: SHX5038

## 2014-08-31 SURGERY — POSTERIOR CERVICAL FUSION/FORAMINOTOMY LEVEL 1
Anesthesia: General | Site: Neck | Laterality: Right

## 2014-08-31 MED ORDER — FLUTICASONE PROPIONATE 50 MCG/ACT NA SUSP
2.0000 | Freq: Every day | NASAL | Status: DC
  Filled 2014-08-31: qty 16

## 2014-08-31 MED ORDER — SUCCINYLCHOLINE CHLORIDE 20 MG/ML IJ SOLN
INTRAMUSCULAR | Status: AC
  Filled 2014-08-31: qty 1

## 2014-08-31 MED ORDER — PROPOFOL 10 MG/ML IV BOLUS
INTRAVENOUS | Status: AC
  Filled 2014-08-31: qty 20

## 2014-08-31 MED ORDER — MIDAZOLAM HCL 2 MG/2ML IJ SOLN
INTRAMUSCULAR | Status: AC
Start: 2014-08-31 — End: 2014-08-31
  Filled 2014-08-31: qty 2

## 2014-08-31 MED ORDER — CEFAZOLIN SODIUM-DEXTROSE 2-3 GM-% IV SOLR
INTRAVENOUS | Status: AC
  Filled 2014-08-31: qty 50

## 2014-08-31 MED ORDER — POLYETHYLENE GLYCOL 3350 17 G PO PACK: 17.0000 g | PACK | Freq: Every day | ORAL | Status: DC | PRN

## 2014-08-31 MED ORDER — CALCIUM CARBONATE-VITAMIN D 500-200 MG-UNIT PO TABS
1.0000 | ORAL_TABLET | Freq: Two times a day (BID) | ORAL | Status: DC
  Administered 2014-08-31 – 2014-09-01 (×2): 1 via ORAL
  Filled 2014-08-31 (×2): qty 1

## 2014-08-31 MED ORDER — FENTANYL 25 MCG/HR TD PT72: 75.0000 ug | MEDICATED_PATCH | TRANSDERMAL | Status: DC

## 2014-08-31 MED ORDER — SODIUM CHLORIDE 0.9 % IV SOLN: 250.0000 mL | INTRAVENOUS | Status: DC

## 2014-08-31 MED ORDER — ATORVASTATIN CALCIUM 10 MG PO TABS
20.0000 mg | ORAL_TABLET | Freq: Every day | ORAL | Status: DC
  Administered 2014-08-31: 20 mg via ORAL
  Filled 2014-08-31: qty 2

## 2014-08-31 MED ORDER — AZELASTINE HCL 0.1 % NA SOLN: 1.0000 | Freq: Three times a day (TID) | NASAL | Status: DC | PRN

## 2014-08-31 MED ORDER — FENTANYL CITRATE 0.05 MG/ML IJ SOLN
INTRAMUSCULAR | Status: DC | PRN
  Administered 2014-08-31 (×3): 100 ug via INTRAVENOUS
  Administered 2014-08-31: 150 ug via INTRAVENOUS
  Administered 2014-08-31: 50 ug via INTRAVENOUS

## 2014-08-31 MED ORDER — DIPHENHYDRAMINE HCL 50 MG/ML IJ SOLN
INTRAMUSCULAR | Status: DC | PRN
  Administered 2014-08-31: 12.5 mg via INTRAVENOUS

## 2014-08-31 MED ORDER — OXYCODONE-ACETAMINOPHEN 5-325 MG PO TABS
1.0000 | ORAL_TABLET | ORAL | Status: DC | PRN
  Administered 2014-08-31: 2 via ORAL
  Filled 2014-08-31: qty 2

## 2014-08-31 MED ORDER — PHENOL 1.4 % MT LIQD: 1.0000 | OROMUCOSAL | Status: DC | PRN

## 2014-08-31 MED ORDER — ASPIRIN EC 81 MG PO TBEC
81.0000 mg | DELAYED_RELEASE_TABLET | Freq: Every day | ORAL | Status: DC
  Administered 2014-08-31: 81 mg via ORAL
  Filled 2014-08-31: qty 1

## 2014-08-31 MED ORDER — BUPIVACAINE HCL (PF) 0.5 % IJ SOLN
INTRAMUSCULAR | Status: DC | PRN
  Administered 2014-08-31: 5 mL

## 2014-08-31 MED ORDER — BISACODYL 10 MG RE SUPP: 10.0000 mg | Freq: Every day | RECTAL | Status: DC | PRN

## 2014-08-31 MED ORDER — DOCUSATE SODIUM 100 MG PO CAPS: 100.0000 mg | ORAL_CAPSULE | Freq: Two times a day (BID) | ORAL | Status: DC

## 2014-08-31 MED ORDER — CYCLOBENZAPRINE HCL 10 MG PO TABS
10.0000 mg | ORAL_TABLET | Freq: Three times a day (TID) | ORAL | Status: DC | PRN
  Administered 2014-08-31 – 2014-09-01 (×4): 10 mg via ORAL
  Filled 2014-08-31 (×3): qty 1

## 2014-08-31 MED ORDER — GLYCOPYRROLATE 0.2 MG/ML IJ SOLN
INTRAMUSCULAR | Status: DC | PRN
  Administered 2014-08-31: 0.4 mg via INTRAVENOUS

## 2014-08-31 MED ORDER — LEVOTHYROXINE SODIUM 50 MCG PO TABS
75.0000 ug | ORAL_TABLET | Freq: Every day | ORAL | Status: DC
  Administered 2014-09-01: 75 ug via ORAL
  Filled 2014-08-31 (×2): qty 1

## 2014-08-31 MED ORDER — GABAPENTIN 600 MG PO TABS
600.0000 mg | ORAL_TABLET | Freq: Every day | ORAL | Status: DC
  Administered 2014-08-31: 600 mg via ORAL
  Filled 2014-08-31: qty 1

## 2014-08-31 MED ORDER — HYDROMORPHONE HCL 1 MG/ML IJ SOLN
0.2500 mg | INTRAMUSCULAR | Status: DC | PRN
  Administered 2014-08-31 (×4): 0.5 mg via INTRAVENOUS

## 2014-08-31 MED ORDER — FENTANYL CITRATE 0.05 MG/ML IJ SOLN
INTRAMUSCULAR | Status: AC
  Filled 2014-08-31: qty 5

## 2014-08-31 MED ORDER — MAGNESIUM CITRATE PO SOLN: 1.0000 | Freq: Once | ORAL | Status: AC | PRN

## 2014-08-31 MED ORDER — THROMBIN 5000 UNITS EX SOLR
CUTANEOUS | Status: DC | PRN
  Administered 2014-08-31 (×2): 5000 [IU] via TOPICAL

## 2014-08-31 MED ORDER — ONDANSETRON HCL 4 MG/2ML IJ SOLN
INTRAMUSCULAR | Status: DC | PRN
  Administered 2014-08-31: 4 mg via INTRAVENOUS

## 2014-08-31 MED ORDER — CEFAZOLIN SODIUM-DEXTROSE 2-3 GM-% IV SOLR
INTRAVENOUS | Status: DC | PRN
  Administered 2014-08-31: 2 g via INTRAVENOUS

## 2014-08-31 MED ORDER — MONTELUKAST SODIUM 10 MG PO TABS
10.0000 mg | ORAL_TABLET | Freq: Every day | ORAL | Status: DC
  Administered 2014-08-31: 10 mg via ORAL
  Filled 2014-08-31: qty 1

## 2014-08-31 MED ORDER — NEOSTIGMINE METHYLSULFATE 10 MG/10ML IV SOLN
INTRAVENOUS | Status: AC
  Filled 2014-08-31: qty 1

## 2014-08-31 MED ORDER — HYDROMORPHONE HCL 1 MG/ML IJ SOLN
INTRAMUSCULAR | Status: AC
Start: 2014-08-31 — End: 2014-08-31
  Administered 2014-08-31: 0.5 mg via INTRAVENOUS
  Filled 2014-08-31: qty 2

## 2014-08-31 MED ORDER — ONDANSETRON HCL 4 MG/2ML IJ SOLN: 4.0000 mg | INTRAMUSCULAR | Status: DC | PRN

## 2014-08-31 MED ORDER — PHENYLEPHRINE HCL 10 MG/ML IJ SOLN
INTRAMUSCULAR | Status: DC | PRN
  Administered 2014-08-31 (×8): 80 ug via INTRAVENOUS

## 2014-08-31 MED ORDER — SUCCINYLCHOLINE CHLORIDE 20 MG/ML IJ SOLN
INTRAMUSCULAR | Status: DC | PRN
  Administered 2014-08-31: 110 mg via INTRAVENOUS

## 2014-08-31 MED ORDER — PANTOPRAZOLE SODIUM 40 MG PO TBEC: 40.0000 mg | DELAYED_RELEASE_TABLET | Freq: Every day | ORAL | Status: DC

## 2014-08-31 MED ORDER — LIDOCAINE HCL (CARDIAC) 20 MG/ML IV SOLN
INTRAVENOUS | Status: AC
  Filled 2014-08-31: qty 5

## 2014-08-31 MED ORDER — ROCURONIUM BROMIDE 100 MG/10ML IV SOLN
INTRAVENOUS | Status: DC | PRN
  Administered 2014-08-31: 40 mg via INTRAVENOUS

## 2014-08-31 MED ORDER — HYDROMORPHONE HCL 1 MG/ML IJ SOLN: 0.5000 mg | INTRAMUSCULAR | Status: DC | PRN

## 2014-08-31 MED ORDER — NITROGLYCERIN 0.4 MG SL SUBL: 0.4000 mg | SUBLINGUAL_TABLET | SUBLINGUAL | Status: DC | PRN

## 2014-08-31 MED ORDER — EPINEPHRINE 0.3 MG/0.3ML IJ SOAJ
0.3000 mg | Freq: Once | INTRAMUSCULAR | Status: AC | PRN
  Filled 2014-08-31: qty 0.6

## 2014-08-31 MED ORDER — LIDOCAINE HCL (CARDIAC) 20 MG/ML IV SOLN
INTRAVENOUS | Status: DC | PRN
  Administered 2014-08-31: 60 mg via INTRAVENOUS

## 2014-08-31 MED ORDER — SODIUM CHLORIDE 0.9 % IJ SOLN
INTRAMUSCULAR | Status: AC
  Filled 2014-08-31: qty 10

## 2014-08-31 MED ORDER — CEFAZOLIN SODIUM 1-5 GM-% IV SOLN
1.0000 g | Freq: Three times a day (TID) | INTRAVENOUS | Status: AC
  Administered 2014-08-31 (×2): 1 g via INTRAVENOUS
  Filled 2014-08-31 (×2): qty 50

## 2014-08-31 MED ORDER — GLYCOPYRROLATE 0.2 MG/ML IJ SOLN
INTRAMUSCULAR | Status: AC
Start: 2014-08-31 — End: 2014-08-31
  Filled 2014-08-31: qty 2

## 2014-08-31 MED ORDER — DIAZEPAM 5 MG PO TABS
5.0000 mg | ORAL_TABLET | Freq: Every evening | ORAL | Status: DC | PRN
  Administered 2014-08-31: 5 mg via ORAL
  Filled 2014-08-31: qty 1

## 2014-08-31 MED ORDER — FLUTICASONE PROPIONATE HFA 44 MCG/ACT IN AERO
1.0000 | INHALATION_SPRAY | Freq: Two times a day (BID) | RESPIRATORY_TRACT | Status: DC
  Administered 2014-08-31 – 2014-09-01 (×2): 1 via RESPIRATORY_TRACT
  Filled 2014-08-31: qty 10.6

## 2014-08-31 MED ORDER — 0.9 % SODIUM CHLORIDE (POUR BTL) OPTIME
TOPICAL | Status: DC | PRN
  Administered 2014-08-31: 1000 mL

## 2014-08-31 MED ORDER — ONDANSETRON HCL 4 MG/2ML IJ SOLN
INTRAMUSCULAR | Status: AC
  Filled 2014-08-31: qty 2

## 2014-08-31 MED ORDER — ROCURONIUM BROMIDE 50 MG/5ML IV SOLN
INTRAVENOUS | Status: AC
  Filled 2014-08-31: qty 1

## 2014-08-31 MED ORDER — DEXAMETHASONE SODIUM PHOSPHATE 10 MG/ML IJ SOLN
INTRAMUSCULAR | Status: DC | PRN
  Administered 2014-08-31: 10 mg via INTRAVENOUS

## 2014-08-31 MED ORDER — SODIUM CHLORIDE 0.9 % IJ SOLN
3.0000 mL | Freq: Two times a day (BID) | INTRAMUSCULAR | Status: DC
  Administered 2014-08-31: 3 mL via INTRAVENOUS

## 2014-08-31 MED ORDER — NEOSTIGMINE METHYLSULFATE 10 MG/10ML IV SOLN
INTRAVENOUS | Status: DC | PRN
  Administered 2014-08-31: 3 mg via INTRAVENOUS

## 2014-08-31 MED ORDER — CYCLOBENZAPRINE HCL 10 MG PO TABS
ORAL_TABLET | ORAL | Status: AC
  Administered 2014-08-31: 10 mg via ORAL
  Filled 2014-08-31: qty 1

## 2014-08-31 MED ORDER — PHENYLEPHRINE 40 MCG/ML (10ML) SYRINGE FOR IV PUSH (FOR BLOOD PRESSURE SUPPORT)
PREFILLED_SYRINGE | INTRAVENOUS | Status: AC
  Filled 2014-08-31: qty 10

## 2014-08-31 MED ORDER — LACTATED RINGERS IV SOLN
INTRAVENOUS | Status: DC | PRN
  Administered 2014-08-31 (×2): via INTRAVENOUS

## 2014-08-31 MED ORDER — DOCUSATE SODIUM 100 MG PO CAPS
100.0000 mg | ORAL_CAPSULE | Freq: Two times a day (BID) | ORAL | Status: DC
  Administered 2014-08-31 (×2): 100 mg via ORAL
  Filled 2014-08-31 (×2): qty 1

## 2014-08-31 MED ORDER — KCL IN DEXTROSE-NACL 20-5-0.45 MEQ/L-%-% IV SOLN: INTRAVENOUS | Status: DC

## 2014-08-31 MED ORDER — LORATADINE 10 MG PO TABS: 10.0000 mg | ORAL_TABLET | Freq: Every day | ORAL | Status: DC

## 2014-08-31 MED ORDER — ACETAMINOPHEN 650 MG RE SUPP: 650.0000 mg | RECTAL | Status: DC | PRN

## 2014-08-31 MED ORDER — HYDROCODONE-ACETAMINOPHEN 5-325 MG PO TABS
1.0000 | ORAL_TABLET | ORAL | Status: DC | PRN
  Administered 2014-08-31: 2 via ORAL

## 2014-08-31 MED ORDER — HYDROMORPHONE HCL 2 MG PO TABS
4.0000 mg | ORAL_TABLET | ORAL | Status: DC | PRN
  Administered 2014-08-31 (×2): 8 mg via ORAL
  Administered 2014-08-31 (×2): 4 mg via ORAL
  Administered 2014-09-01 (×2): 8 mg via ORAL
  Filled 2014-08-31: qty 4
  Filled 2014-08-31: qty 2
  Filled 2014-08-31 (×3): qty 4
  Filled 2014-08-31: qty 2

## 2014-08-31 MED ORDER — SENNA 8.6 MG PO TABS
1.0000 | ORAL_TABLET | Freq: Two times a day (BID) | ORAL | Status: DC
  Administered 2014-08-31 (×2): 8.6 mg via ORAL
  Filled 2014-08-31 (×2): qty 1

## 2014-08-31 MED ORDER — EPHEDRINE SULFATE 50 MG/ML IJ SOLN
INTRAMUSCULAR | Status: AC
  Filled 2014-08-31: qty 1

## 2014-08-31 MED ORDER — LORATADINE 10 MG PO TABS
10.0000 mg | ORAL_TABLET | Freq: Every day | ORAL | Status: DC
  Administered 2014-08-31: 10 mg via ORAL
  Filled 2014-08-31: qty 1

## 2014-08-31 MED ORDER — SODIUM CHLORIDE 0.9 % IJ SOLN: 3.0000 mL | INTRAMUSCULAR | Status: DC | PRN

## 2014-08-31 MED ORDER — LIDOCAINE 5 % EX PTCH: 1.0000 | MEDICATED_PATCH | Freq: Every day | CUTANEOUS | Status: DC | PRN

## 2014-08-31 MED ORDER — ACETAMINOPHEN 325 MG PO TABS: 650.0000 mg | ORAL_TABLET | ORAL | Status: DC | PRN

## 2014-08-31 MED ORDER — PROPOFOL 10 MG/ML IV BOLUS
INTRAVENOUS | Status: DC | PRN
  Administered 2014-08-31: 170 mg via INTRAVENOUS

## 2014-08-31 MED ORDER — MIDAZOLAM HCL 5 MG/5ML IJ SOLN
INTRAMUSCULAR | Status: DC | PRN
  Administered 2014-08-31: 2 mg via INTRAVENOUS

## 2014-08-31 MED ORDER — HYDROCODONE-ACETAMINOPHEN 5-325 MG PO TABS
ORAL_TABLET | ORAL | Status: AC
  Filled 2014-08-31: qty 2

## 2014-08-31 MED ORDER — METOPROLOL TARTRATE 50 MG PO TABS
50.0000 mg | ORAL_TABLET | Freq: Two times a day (BID) | ORAL | Status: DC
  Administered 2014-08-31: 50 mg via ORAL
  Filled 2014-08-31: qty 1

## 2014-08-31 MED ORDER — METAXALONE 800 MG PO TABS
800.0000 mg | ORAL_TABLET | Freq: Three times a day (TID) | ORAL | Status: DC | PRN
  Filled 2014-08-31: qty 1

## 2014-08-31 MED ORDER — ADULT MULTIVITAMIN W/MINERALS CH
1.0000 | ORAL_TABLET | Freq: Every day | ORAL | Status: DC
  Administered 2014-08-31: 1 via ORAL
  Filled 2014-08-31: qty 1

## 2014-08-31 MED ORDER — MENTHOL 3 MG MT LOZG: 1.0000 | LOZENGE | OROMUCOSAL | Status: DC | PRN

## 2014-08-31 MED ORDER — LIDOCAINE-EPINEPHRINE 1 %-1:100000 IJ SOLN
INTRAMUSCULAR | Status: DC | PRN
  Administered 2014-08-31: 5 mL

## 2014-08-31 MED ORDER — ALBUTEROL SULFATE (2.5 MG/3ML) 0.083% IN NEBU
3.0000 mL | INHALATION_SOLUTION | Freq: Four times a day (QID) | RESPIRATORY_TRACT | Status: DC | PRN
Start: 2014-08-31 — End: 2014-09-01

## 2014-08-31 SURGICAL SUPPLY — 77 items
BENZOIN TINCTURE PRP APPL 2/3 (GAUZE/BANDAGES/DRESSINGS) IMPLANT
BIT DRILL NEURO 2X3.1 SFT TUCH (MISCELLANEOUS) IMPLANT
BLADE CLIPPER SURG (BLADE) IMPLANT
BLADE SURG 11 STRL SS (BLADE) ×3 IMPLANT
BLADE ULTRA TIP 2M (BLADE) IMPLANT
BUR PRECISION FLUTE 5.0 (BURR) IMPLANT
CANISTER SUCT 3000ML (MISCELLANEOUS) ×3 IMPLANT
CLOSURE WOUND 1/2 X4 (GAUZE/BANDAGES/DRESSINGS)
CONT SPEC 4OZ CLIKSEAL STRL BL (MISCELLANEOUS) ×3 IMPLANT
DECANTER SPIKE VIAL GLASS SM (MISCELLANEOUS) ×3 IMPLANT
DERMABOND ADHESIVE PROPEN (GAUZE/BANDAGES/DRESSINGS) ×2
DERMABOND ADVANCED .7 DNX6 (GAUZE/BANDAGES/DRESSINGS) ×1 IMPLANT
DRAPE C-ARM 42X72 X-RAY (DRAPES) ×6 IMPLANT
DRAPE LAPAROTOMY 100X72 PEDS (DRAPES) ×3 IMPLANT
DRAPE MICROSCOPE LEICA (MISCELLANEOUS) IMPLANT
DRAPE POUCH INSTRU U-SHP 10X18 (DRAPES) ×3 IMPLANT
DRILL NEURO 2X3.1 SOFT TOUCH (MISCELLANEOUS)
DRSG PAD ABDOMINAL 8X10 ST (GAUZE/BANDAGES/DRESSINGS) IMPLANT
DRSG TELFA 3X8 NADH (GAUZE/BANDAGES/DRESSINGS) ×3 IMPLANT
DURAPREP 6ML APPLICATOR 50/CS (WOUND CARE) ×3 IMPLANT
ELECT REM PT RETURN 9FT ADLT (ELECTROSURGICAL) ×3
ELECTRODE REM PT RTRN 9FT ADLT (ELECTROSURGICAL) ×1 IMPLANT
EVACUATOR 1/8 PVC DRAIN (DRAIN) ×6 IMPLANT
GAUZE SPONGE 4X4 12PLY STRL (GAUZE/BANDAGES/DRESSINGS) ×3 IMPLANT
GAUZE SPONGE 4X4 16PLY XRAY LF (GAUZE/BANDAGES/DRESSINGS) IMPLANT
GLOVE BIO SURGEON STRL SZ8 (GLOVE) ×3 IMPLANT
GLOVE BIOGEL PI IND STRL 7.0 (GLOVE) ×4 IMPLANT
GLOVE BIOGEL PI IND STRL 8 (GLOVE) ×1 IMPLANT
GLOVE BIOGEL PI IND STRL 8.5 (GLOVE) ×1 IMPLANT
GLOVE BIOGEL PI INDICATOR 7.0 (GLOVE) ×8
GLOVE BIOGEL PI INDICATOR 8 (GLOVE) ×2
GLOVE BIOGEL PI INDICATOR 8.5 (GLOVE) ×2
GLOVE ECLIPSE 8.0 STRL XLNG CF (GLOVE) ×3 IMPLANT
GLOVE EXAM NITRILE LRG STRL (GLOVE) IMPLANT
GLOVE EXAM NITRILE MD LF STRL (GLOVE) IMPLANT
GLOVE EXAM NITRILE XL STR (GLOVE) IMPLANT
GLOVE EXAM NITRILE XS STR PU (GLOVE) IMPLANT
GLOVE SS BIOGEL STRL SZ 6.5 (GLOVE) ×4 IMPLANT
GLOVE SUPERSENSE BIOGEL SZ 6.5 (GLOVE) ×8
GOWN STRL REUS W/ TWL LRG LVL3 (GOWN DISPOSABLE) ×1 IMPLANT
GOWN STRL REUS W/ TWL XL LVL3 (GOWN DISPOSABLE) IMPLANT
GOWN STRL REUS W/TWL 2XL LVL3 (GOWN DISPOSABLE) ×6 IMPLANT
GOWN STRL REUS W/TWL LRG LVL3 (GOWN DISPOSABLE) ×2
GOWN STRL REUS W/TWL XL LVL3 (GOWN DISPOSABLE)
HEMOSTAT SURGICEL 2X14 (HEMOSTASIS) IMPLANT
KIT BASIN OR (CUSTOM PROCEDURE TRAY) ×3 IMPLANT
KIT INFUSE X SMALL 1.4CC (Orthopedic Implant) ×3 IMPLANT
KIT ROOM TURNOVER OR (KITS) ×3 IMPLANT
MARKER SKIN DUAL TIP RULER LAB (MISCELLANEOUS) ×3 IMPLANT
NEEDLE HYPO 18GX1.5 BLUNT FILL (NEEDLE) IMPLANT
NEEDLE HYPO 25X1 1.5 SAFETY (NEEDLE) ×3 IMPLANT
NEEDLE SPNL 22GX3.5 QUINCKE BK (NEEDLE) ×3 IMPLANT
NS IRRIG 1000ML POUR BTL (IV SOLUTION) ×3 IMPLANT
PACK FOAM VITOSS 5CC (Orthopedic Implant) ×3 IMPLANT
PACK LAMINECTOMY NEURO (CUSTOM PROCEDURE TRAY) ×3 IMPLANT
PIN MAYFIELD SKULL DISP (PIN) ×3 IMPLANT
ROD VUEPOINT 3.5X60 (Rod) ×3 IMPLANT
RUBBERBAND STERILE (MISCELLANEOUS) IMPLANT
SCREW MA MM 3.5X12 (Screw) ×12 IMPLANT
SCREW SET THREADED (Screw) ×12 IMPLANT
SPONGE INTESTINAL PEANUT (DISPOSABLE) IMPLANT
SPONGE SURGIFOAM ABS GEL SZ50 (HEMOSTASIS) ×3 IMPLANT
STAPLER SKIN PROX WIDE 3.9 (STAPLE) ×3 IMPLANT
STRIP CLOSURE SKIN 1/2X4 (GAUZE/BANDAGES/DRESSINGS) IMPLANT
SUT ETHILON 3 0 FSL (SUTURE) ×3 IMPLANT
SUT VIC AB 0 CT1 18XCR BRD8 (SUTURE) ×1 IMPLANT
SUT VIC AB 0 CT1 8-18 (SUTURE) ×2
SUT VIC AB 2-0 CP2 18 (SUTURE) ×3 IMPLANT
SUT VIC AB 3-0 SH 8-18 (SUTURE) IMPLANT
SYR 20ML ECCENTRIC (SYRINGE) ×3 IMPLANT
SYR 3ML LL SCALE MARK (SYRINGE) IMPLANT
TOWEL OR 17X24 6PK STRL BLUE (TOWEL DISPOSABLE) ×3 IMPLANT
TOWEL OR 17X26 10 PK STRL BLUE (TOWEL DISPOSABLE) ×3 IMPLANT
TRAP SPECIMEN MUCOUS 40CC (MISCELLANEOUS) ×3 IMPLANT
TRAY FOLEY CATH 14FRSI W/METER (CATHETERS) IMPLANT
UNDERPAD 30X30 INCONTINENT (UNDERPADS AND DIAPERS) ×3 IMPLANT
WATER STERILE IRR 1000ML POUR (IV SOLUTION) ×3 IMPLANT

## 2014-08-31 NOTE — Progress Notes (Signed)
Utilization review completed.  

## 2014-08-31 NOTE — Brief Op Note (Signed)
08/31/2014  10:29 AM  PATIENT:  Latoya [name]  68 y.o. female  PRE-OPERATIVE DIAGNOSIS:  Cervical radiculopathy, Cervicalgia, Pseudoarthrosis C 6 C 7 level  POST-OPERATIVE DIAGNOSIS:  Cervical radiculopathy, Cervicalgia, Pseudoarthrosis C 6 C 7 level  PROCEDURE:  Procedure(s): Right cervical six-seven foraminotomy, Cervical five-Thoracic one fusion, Cervical six-seven lateral mass screws   SURGEON:  Surgeon(s) and Role:    * Erline Levine, MD - Primary    * Consuella Lose, MD - Assisting  PHYSICIAN ASSISTANT:   ASSISTANTS: Poteat, RN   ANESTHESIA:   general  EBL:  Total I/O In: 1700 [I.V.:1700] Out: 150 [Blood:150]  BLOOD ADMINISTERED:none  DRAINS: (Medium) Hemovact drain(s) in the epidural space with  Suction Open   LOCAL MEDICATIONS USED:  LIDOCAINE   SPECIMEN:  No Specimen  DISPOSITION OF SPECIMEN:  N/A  COUNTS:  YES  TOURNIQUET:  * No tourniquets in log *  DICTATION: DICTATION: Patient is 68 year old woman with neck pain and right arm pain s/p ACDF C5 - T 1 levels with incomplete healing and chronic pain at the C 6 7 level.  It was elected to take the patient to surgery for posterior cervical fusion.  PROCEDURE: Patient was brought to the OR and following smooth and uncomplicated induction of GETA, patient was placed in 3 pin fixation with the radiolucent head frame and rolled into a prone position on the OR table.  C arm was used to localize correct level.  Posterior neck was prepped and draped in the usual fashion with betadine scrub and Duraprep.  Area of planned incision was infiltrated with local lidocaine.  Incision was made from C 6-T 1  and carried through the avascular midline plane to expose these lavels and their lateral masses.  There did not appear to be solid fusion at C 6 7 level, but there did appear to be a solid fusion at c 5 - C 6 and C 7 - T 1 levels.    Lateral mass screws were placed from C 6 to C 7 bilaterally according to standard landmarks  and their positioning was confirmed with fluoroscopy.  It was very difficult to visualize this level with X ray, despite multiple efforts to do so because of patient habitus and scoliosis of spine. The facet joints and laminae were decorticated and packed with extra small BMP and Vitoss bone graft extender along with local bone autograft on the left of midline.  On the right side, a laminectomy was performed with thorough foraminotomy to decompress the right C 7 nerve root.Marland Kitchen  Screws and rods were locked down in situ.  A medium Hemovac drain was placed.  Hemostasis was assured.  Wound was closed with 0, 2-0, and 3-0 vicryl sutures.  Sterile occlusive dressing was placed.  Patient was taken out of pins and turned back onto the OR table.    Patient was extubated in the operating room and taken to recovery in stable and satisfactory condition.  Counts were correct at the end of the case.  PLAN OF CARE: Admit to inpatient   PATIENT DISPOSITION:  PACU - hemodynamically stable.   Delay start of Pharmacological VTE agent (>24hrs) due to surgical blood loss or risk of bleeding: yes

## 2014-08-31 NOTE — Progress Notes (Signed)
Awake, alert, conversant.  MAEW with good bilateral upper extremity strength.  Pain well controlled.

## 2014-08-31 NOTE — Anesthesia Preprocedure Evaluation (Addendum)
Anesthesia Evaluation  Patient identified by MRN, date of birth, ID band Patient awake    Reviewed: Allergy & Precautions, H&P , NPO status , Patient's Chart, lab work & pertinent test results  Airway Mallampati: II  TM Distance: <3 FB Neck ROM: Full    Dental   Pulmonary asthma ,  breath sounds clear to auscultation        Cardiovascular hypertension, + CAD and + Peripheral Vascular Disease Rhythm:Regular Rate:Normal     Neuro/Psych    GI/Hepatic Neg liver ROS, hiatal hernia, PUD, GERD-  ,  Endo/Other  Hypothyroidism   Renal/GU negative Renal ROS     Musculoskeletal   Abdominal   Peds  Hematology   Anesthesia Other Findings   Reproductive/Obstetrics                            Anesthesia Physical Anesthesia Plan  ASA: III  Anesthesia Plan: General   Post-op Pain Management:    Induction: Intravenous  Airway Management Planned: Video Laryngoscope Planned  Additional Equipment:   Intra-op Plan:   Post-operative Plan: Possible Post-op intubation/ventilation  Informed Consent: I have reviewed the patients History and Physical, chart, labs and discussed the procedure including the risks, benefits and alternatives for the proposed anesthesia with the patient or authorized representative who has indicated his/her understanding and acceptance.   Dental advisory given  Plan Discussed with: CRNA, Anesthesiologist and Surgeon  Anesthesia Plan Comments:         Anesthesia Quick Evaluation

## 2014-08-31 NOTE — Anesthesia Procedure Notes (Signed)
Procedure Name: Intubation Date/Time: 08/31/2014 7:46 AM Performed by: Rebekah Chesterfield L Pre-anesthesia Checklist: Patient identified, Emergency Drugs available, Suction available, Patient being monitored and Timeout performed Patient Re-evaluated:Patient Re-evaluated prior to inductionOxygen Delivery Method: Circle system utilized Preoxygenation: Pre-oxygenation with 100% oxygen Intubation Type: IV induction Ventilation: Mask ventilation without difficulty Grade View: Grade I Tube size: 7.0 mm Number of attempts: 1 Airway Equipment and Method: Stylet and Video-laryngoscopy Placement Confirmation: ETT inserted through vocal cords under direct vision,  positive ETCO2 and breath sounds checked- equal and bilateral Secured at: 21 cm Tube secured with: Tape Dental Injury: Teeth and Oropharynx as per pre-operative assessment  Difficulty Due To: Difficulty was anticipated, Difficult Airway- due to limited oral opening, Difficult Airway-  due to neck instability and Difficult Airway- due to reduced neck mobility Future Recommendations: Recommend- induction with short-acting agent, and alternative techniques readily available Comments: Pt reports having her previous cervical fusion broken during laryngoscopy. Multiple cervical surgeries in past.   Smooth iv induction, easy mask, neck maintained in neutral position as demonstrated by pt prior to induction. Easy glide scope intubation ett visualized through cords.

## 2014-08-31 NOTE — Anesthesia Postprocedure Evaluation (Signed)
  Anesthesia Post-op Note  Patient: Latoya [name]  Procedure(s) Performed: Procedure(s): Right cervical six-seven foraminotomy, Cervical five-Thoracic one fusion, Cervical six-seven lateral mass screws (Right)  Patient Location: PACU  Anesthesia Type:General  Level of Consciousness: awake  Airway and Oxygen Therapy: Patient Spontanous Breathing  Post-op Pain: mild  Post-op Assessment: Post-op Vital signs reviewed  Post-op Vital Signs: Reviewed  Last Vitals:  Filed Vitals:   08/31/14 1045  BP: 167/76  Pulse: 80  Temp:   Resp: 16    Complications: No apparent anesthesia complications

## 2014-08-31 NOTE — Op Note (Signed)
08/31/2014  10:29 AM  PATIENT:  Latoya [name]  68 y.o. female  PRE-OPERATIVE DIAGNOSIS:  Cervical radiculopathy, Cervicalgia, Pseudoarthrosis C 6 C 7 level  POST-OPERATIVE DIAGNOSIS:  Cervical radiculopathy, Cervicalgia, Pseudoarthrosis C 6 C 7 level  PROCEDURE:  Procedure(s): Right cervical six-seven foraminotomy, Cervical five-Thoracic one fusion, Cervical six-seven lateral mass screws   SURGEON:  Surgeon(s) and Role:    * Erline Levine, MD - Primary    * Consuella Lose, MD - Assisting  PHYSICIAN ASSISTANT:   ASSISTANTS: Poteat, RN   ANESTHESIA:   general  EBL:  Total I/O In: 1700 [I.V.:1700] Out: 150 [Blood:150]  BLOOD ADMINISTERED:none  DRAINS: (Medium) Hemovact drain(s) in the epidural space with  Suction Open   LOCAL MEDICATIONS USED:  LIDOCAINE   SPECIMEN:  No Specimen  DISPOSITION OF SPECIMEN:  N/A  COUNTS:  YES  TOURNIQUET:  * No tourniquets in log *  DICTATION: DICTATION: Patient is 68 year old woman with neck pain and right arm pain s/p ACDF C5 - T 1 levels with incomplete healing and chronic pain at the C 6 7 level.  It was elected to take the patient to surgery for posterior cervical fusion.  PROCEDURE: Patient was brought to the OR and following smooth and uncomplicated induction of GETA, patient was placed in 3 pin fixation with the radiolucent head frame and rolled into a prone position on the OR table.  C arm was used to localize correct level.  Posterior neck was prepped and draped in the usual fashion with betadine scrub and Duraprep.  Area of planned incision was infiltrated with local lidocaine.  Incision was made from C 6-T 1  and carried through the avascular midline plane to expose these lavels and their lateral masses.  There did not appear to be solid fusion at C 6 7 level, but there did appear to be a solid fusion at c 5 - C 6 and C 7 - T 1 levels.    Lateral mass screws were placed from C 6 to C 7 bilaterally according to standard landmarks  and their positioning was confirmed with fluoroscopy.  It was very difficult to visualize this level with X ray, despite multiple efforts to do so because of patient habitus and scoliosis of spine. The facet joints and laminae were decorticated and packed with extra small BMP and Vitoss bone graft extender along with local bone autograft on the left of midline.  On the right side, a laminectomy was performed with thorough foraminotomy to decompress the right C 7 nerve root.Marland Kitchen  Screws and rods were locked down in situ.  A medium Hemovac drain was placed.  Hemostasis was assured.  Wound was closed with 0, 2-0, and 3-0 vicryl sutures.  Sterile occlusive dressing was placed.  Patient was taken out of pins and turned back onto the OR table.    Patient was extubated in the operating room and taken to recovery in stable and satisfactory condition.  Counts were correct at the end of the case.  PLAN OF CARE: Admit to inpatient   PATIENT DISPOSITION:  PACU - hemodynamically stable.   Delay start of Pharmacological VTE agent (>24hrs) due to surgical blood loss or risk of bleeding: yes

## 2014-08-31 NOTE — H&P (Signed)
> [address] [place], [place] [address] Phone: [phone]   Patient ID:   [id number] Patient: Latoya [name]  Date of Birth: Nov 11, 1945 Visit Type: Office Visit   Date: 08/18/2014 12:45 PM Provider: Marchia Meiers. Vertell Limber MD   This 68 year old female presents for Follow Up of neck pain.  History of Present Illness: 1.  Follow Up of neck pain  Pt visits to discuss planned cervical surgery  I met with the patient and her husband discussed the planned surgery.  She is complaining of pain into both of her arms and but does not appear to have weakness in either triceps.  I recommended bilateral foraminotomies at C6 C7 and posterior fusion at C6 through C7 C7 but may be extending from C5 to T1 levels.  She wants a Foley after surgery and wants a central line per Dr. Joya Gaskins.  We are going to find out about getting her authorization for a bone growth stimulator.  She says that she does better with IV Dilaudid and by mouth Dilaudid for pain management and I told her I did not think the pain was going to be so severe that she was going to need fentanyl.  I answered her and her husband's questions.  She is ready to go ahead with surgery.      Medical/Surgical/Interim History Reviewed, no change.  Last detailed document date:07/29/2013.   PAST MEDICAL HISTORY, SURGICAL HISTORY, FAMILY HISTORY, SOCIAL HISTORY AND REVIEW OF SYSTEMS I have reviewed the patient's past medical, surgical, family and social history as well as the comprehensive review of systems as included on the [facility] history form dated, which I have signed.  Family History: Reviewed, no changes.  Last detailed document: 07/29/2013.   Social History: Tobacco use reviewed. Reviewed, no changes. Last detailed document date: 07/29/2013.      MEDICATIONS(added, continued or stopped this visit):   Started Medication Directions Instruction Stopped   Astelin 137 mcg nasal spray  aerosol spray 2 spray by intranasal route 2 times every day in each nostril     atorvastatin 20 mg tablet take 1 tablet by oral route  every day     Calcium with Vitamin D 600 mg (1,500 mg)-400 unit tablet Take 1 tablet by mouth twice daily     Claritin-D 12 Hour 5 mg-120 mg tablet,extended release take 1 tablet by oral route  every 12 hours     docusate sodium 100 mg capsule take 2 capsule by oral route  every day at bedtime as needed     Duragesic 75 mcg/hr transdermal patch apply 1 patch by transdermal route  every 48 hours     Enteric Coated Aspirin 81 mg tablet,delayed release take 1 tablet by oral route  every day     Flexeril 10 mg tablet take 1 tablet by oral route 3 times every day as needed     gabapentin 600 mg tablet take 1 tablet by oral route  every day     hydromorphone 4 mg tablet take 1- 2 tablets by oral route  every 4 hours as needed     Lidoderm 5 % (700 mg/patch) adhesive patch apply 1 patch by transdermal route  every day (May wear up to 12hours.)     metaxalone 800 mg tablet take 1 tablet by oral route 3 times every day as needed     metoprolol succinate ER 50 mg tablet,extended release 24 hr take 1 tablet by oral route  twice daily     multivitamin tablet Take 1 tablet daily     Nexium 40 mg capsule,delayed release take 1 capsule by oral route  every day     nitroglycerin 0.4 mg sublingual tablet place 1 tablet by sublingual route at 1st sign of attack; may repeat every 5 minutes up to 3 tabs; if norelief seek medical help     ProAir RespiClick 90 mcg/actuation breath activated inhale 2 puff by inhalation route  every 8 hours as needed     Qvar 80 mcg/actuation Metered Aerosol oral inhaler inhale 2 puff by inhalation route 3 times every day     Singulair 10 mg tablet take 1 tablet by oral route  every day in the evening     Synthroid 75 mcg tablet take 1 tablet by oral route  every day     triamcinolone acetonide 55 mcg nasal spray aerosol spray 2 spray by intranasal route   every day in each nostril     Valium 5 mg tablet take 1 tablet by oral route 2 times every day      ALLERGIES:  Ingredient Reaction Medication Name Comment  INDOMETHACIN  INDOCIN   SOAP  Betadine   POVIDONE-IODINE  Betadine   PORK DERIVED (PORCINE)     (DO NOT USE, NOT SCREENED) HEPARIN ANALOGUES Unknown    ENOXAPARIN SODIUM  Lovenox   MISCELLANEOUS MEDICAL SUPPLY  Hibiclens   CHLORHEXIDINE GLUCONATE  Hibiclens      Vitals Date Temp F BP Pulse Ht In Wt Lb BMI BSA Pain Score  08/18/2014  132/78 68 66.5 155 24.64  10/10        IMPRESSION Pseudoarthrosis C6 C7 with posterior cervical fusion plan.  Assessment/Plan # Detail Type Description   1. Assessment Brachial neuritis (M54.12).       2. Assessment Antiphospholipid antibody syndrome (D68.61).       3. Assessment Cervicalgia (M54.2).       4. Assessment Displacement of intervertebral disc of cervicothoracic region (M50.23).         Pain Assessment/Treatment Pain Scale: 10/10. Method: Numeric Pain Intensity Scale. Location: neck. Onset: 03/22/2012. Duration: varies. Quality: discomforting. Pain Assessment/Treatment follow-up plan of care: Patient is currently taking medication for pain as prescribed..  Fall Risk Plan The patient has not fallen in the last year.  Proceed with surgery.  Risks and benefits were discussed and questions were answered.             Provider:  Marchia Meiers. Vertell Limber MD  08/21/2014 04:29 PM Dictation edited by: Marchia Meiers. Vertell Limber    CC Providers: Erline Levine MD [address] [place], [place] [address] ----------------------------------------------------------------------------------------------------------------------------------------------------------------------         Electronically signed by Marchia Meiers. Vertell Limber MD on 08/21/2014 04:29 PM

## 2014-08-31 NOTE — Interval H&P Note (Signed)
History and Physical Interval Note:  08/31/2014 7:15 AM  Latoya [name]  has presented today for surgery, with the diagnosis of Cervical radiculopathy, Cervicalgia, Pseudoarthrosis  The various methods of treatment have been discussed with the patient and family. After consideration of risks, benefits and other options for treatment, the patient has consented to  Procedure(s) with comments: C6-7 Right Foraminotomy with posterior cervical fusion/lateral mass screws/may place screws from C5-T1 (Right) - C6-7 Right Foraminotomy with posterior cervical fusion/lateral mass screws/may place screws from C5-T1 as a surgical intervention .  The patient's history has been reviewed, patient examined, no change in status, stable for surgery.  I have reviewed the patient's chart and labs.  Questions were answered to the patient's satisfaction.     Latoya [name] D  Patient complains that pain is bilateral.  Fixation and decompression is planned bilaterally.

## 2014-08-31 NOTE — Transfer of Care (Signed)
Immediate Anesthesia Transfer of Care Note  Patient: Latoya [name]  Procedure(s) Performed: Procedure(s): Right cervical six-seven foraminotomy, Cervical five-Thoracic one fusion, Cervical six-seven lateral mass screws (Right)  Patient Location: PACU  Anesthesia Type:General  Level of Consciousness: awake, alert , oriented and patient cooperative  Airway & Oxygen Therapy: Patient Spontanous Breathing and Patient connected to nasal cannula oxygen  Post-op Assessment: Report given to PACU RN, Post -op Vital signs reviewed and stable and Patient moving all extremities  Post vital signs: Reviewed and stable  Complications: No apparent anesthesia complications

## 2014-09-01 ENCOUNTER — Encounter (HOSPITAL_COMMUNITY): Payer: Self-pay | Admitting: Neurosurgery

## 2014-09-01 NOTE — Progress Notes (Signed)
Subjective: Patient reports "I have some pain, but I know its just that trapezius thats unhappy"  Objective: Vital signs in last 24 hours: Temp:  [97.7 F (36.5 C)-98.6 F (37 C)] 98 F (36.7 C) (11/11 0645) Pulse Rate:  [72-90] 80 (11/11 0645) Resp:  [12-18] 18 (11/11 0645) BP: (114-167)/(46-91) 117/48 mmHg (11/11 0645) SpO2:  [93 %-100 %] 93 % (11/11 0645)  Intake/Output from previous day: 11/10 0701 - 11/11 0700 In: 1700 [I.V.:1700] Out: 620 [Urine:350; Drains:120; Blood:150] Intake/Output this shift:    Alert, conversant, reporting trap pain as expected. MAEW. Good strength BUE. Drsg intact without erythema, swelling, or drainage. Hemovac clotted - pulled. She notes some urinary hesitancy overnight, but has voided well.  Lab Results: No results for input(s): WBC, HGB, HCT, PLT in the last 72 hours. BMET No results for input(s): NA, K, CL, CO2, GLUCOSE, BUN, CREATININE, CALCIUM in the last 72 hours.  Studies/Results: Dg Cervical Spine 2-3 Views  08/31/2014   CLINICAL DATA:  C6-7 fusion  EXAM: DG C-ARM 61-120 MIN; CERVICAL SPINE - 2-3 VIEW  COMPARISON:  None.  FINDINGS: Three spot films were obtained during a fusion at the C6-7 level in anterior fixation plate with multiple fixation screws is noted. Visualization of the actual bony level is difficult due to overlying show shoulder girdles  IMPRESSION: Intraoperative cervical fusion imaging   Electronically Signed   By: Inez Catalina M.D.   On: 08/31/2014 12:25   Dg C-arm 1-60 Min  08/31/2014   CLINICAL DATA:  C6-7 fusion  EXAM: DG C-ARM 61-120 MIN; CERVICAL SPINE - 2-3 VIEW  COMPARISON:  None.  FINDINGS: Three spot films were obtained during a fusion at the C6-7 level in anterior fixation plate with multiple fixation screws is noted. Visualization of the actual bony level is difficult due to overlying show shoulder girdles  IMPRESSION: Intraoperative cervical fusion imaging   Electronically Signed   By: Inez Catalina M.D.   On:  08/31/2014 12:25    Assessment/Plan: Improving postoperatively   LOS: 1 day  Per DrStern, d/c to home with 3-4 week office follow up with x-rays. Will call in [place] to [facility].  he has pain meds for prn use at home. She verballizes understanding of d/c instructions and agrees to call office to schedule 3-4 week visit.    Verdis Prime 09/01/2014, 8:34 AM

## 2014-09-01 NOTE — Progress Notes (Signed)
Discharge orders received. Pt for discharge home today. IV d/c'd. Dressing clean, dry, intact to back of neck. Pt given discharge instructions with verbalized understanding. Family in room to assist with discharge. Staff brought pt downstairs via wheelchair.

## 2014-09-01 NOTE — Evaluation (Signed)
Physical Therapy Evaluation Patient Details Name: Latoya [name] MRN: [id number] DOB: Feb 24, 1946 Today's Date: 09/01/2014   History of Present Illness  Patient is a 68 y/o female s/p Right C6-7 foraminotomy, C5-T 1 fusion. Aspen collar to be worn. PMH of HTN, CAD, PE, DVT and Antiphospholipid syndrome.   Clinical Impression  Patient presents with functional limitations due to deficits listed in PT problem list (see below). Pt with generalized weakness and balance deficits impacting safe mobility. Reviewed cervical precautions and log roll technique. Pt required some assist with bed mobility (husband able to demonstrate safely). Mildly unsteady during gait with some safety concerns as pt leaning on rollator handles when fatigued. Pt would benefit from acute PT to improve transfers, gait, balance and mobility so pt can maximize independence and return to PLOF. Pt has 24/7 supervision at home.    Follow Up Recommendations Home health PT;Supervision/Assistance - 24 hour    Equipment Recommendations  None recommended by PT    Recommendations for Other Services       Precautions / Restrictions Precautions Precautions: Cervical;Fall Required Braces or Orthoses: Cervical Brace Cervical Brace: Hard collar;At all times Restrictions Weight Bearing Restrictions: No      Mobility  Bed Mobility Overal bed mobility: Needs Assistance Bed Mobility: Rolling;Sidelying to Sit;Sit to Sidelying Rolling: Supervision Sidelying to sit: Min guard;HOB elevated     Sit to sidelying: Mod assist;HOB elevated General bed mobility comments: HOB slightly elevated (to simulate bed at home), no rails. VC's for sequencing for log roll technique. MOd A to bring BLEs back into bed (husband assisted).  Transfers Overall transfer level: Needs assistance Equipment used: 4-wheeled walker Transfers: Sit to/from Stand Sit to Stand: Min guard         General transfer comment: Min guard for safety. Pt safely  demonstrated good use of rollator brakes prior to sitting and for standing. Stood from [employer]. from [employer].  Ambulation/Gait Ambulation/Gait assistance: Min guard Ambulation Distance (Feet): 150 Feet Assistive device: 4-wheeled walker Gait Pattern/deviations: Step-through pattern;Decreased stride length;Trunk flexed Gait velocity: slow   General Gait Details: Pt with thoracic kyphotic posturing making forward gaze difficult. Not able to stand fully upright. Pt with increased pain resulting on leaning on rollator handles for support. Increased knee flexion RLE during gait.  Stairs Stairs: Yes Stairs assistance: Min guard Stair Management: Two rails;Alternating pattern;Forwards Number of Stairs: 3 (x2) General stair comments: Min guard for safety due to lack of visual gaze from Aspen collar.  Wheelchair Mobility    Modified Rankin (Stroke Patients Only)       Balance Overall balance assessment: Needs assistance Sitting-balance support: Feet supported;No upper extremity supported Sitting balance-Leahy Scale: Fair     Standing balance support: During functional activity Standing balance-Leahy Scale: Poor Standing balance comment: Requires BUE support on rollator for safety/balance.                             Pertinent Vitals/Pain Pain Assessment: 0-10 Pain Score:  (not rated on pain scale.) Pain Location: neck/back Pain Descriptors / Indicators: Sore;Aching Pain Intervention(s): Limited activity within patient's tolerance;Monitored during session;Repositioned;Premedicated before session    Home Living Family/patient expects to be discharged to:: Private residence Living Arrangements: Spouse/significant other Available Help at Discharge: Family;Available 24 hours/day Type of Home: House Home Access: Stairs to enter Entrance Stairs-Rails: Psychiatric nurse of Steps: 4 Home Layout: One level Home Equipment: Walker - 4 wheels;Walker - 2  wheels;Bedside commode;Shower seat;Grab  bars - toilet;Grab bars - tub/shower;Other (comment) Surveyor, minerals)      Prior Function Level of Independence: Independent with assistive device(s)         Comments: Pt reports using scooter for community mobility and rollator/RW for household ambulation.     Hand Dominance   Dominant Hand: Right    Extremity/Trunk Assessment   Upper Extremity Assessment: Defer to OT evaluation           Lower Extremity Assessment: Generalized weakness      Cervical / Trunk Assessment: Kyphotic  Communication   Communication: No difficulties  Cognition Arousal/Alertness: Awake/alert Behavior During Therapy: WFL for tasks assessed/performed Overall Cognitive Status: Within Functional Limits for tasks assessed                      General Comments      Exercises        Assessment/Plan    PT Assessment Patient needs continued PT services  PT Diagnosis Acute pain;Difficulty walking   PT Problem List Decreased strength;Pain;Decreased activity tolerance;Decreased balance;Decreased mobility;Decreased safety awareness  PT Treatment Interventions Balance training;Gait training;Neuromuscular re-education;Stair training;Patient/family education;Functional mobility training;Therapeutic activities;Therapeutic exercise   PT Goals (Current goals can be found in the Care Plan section) Acute Rehab PT Goals Patient Stated Goal: to make this pain improve PT Goal Formulation: With patient Time For Goal Achievement: 09/15/14 Potential to Achieve Goals: Good    Frequency Min 5X/week   Barriers to discharge        Co-evaluation               End of Session Equipment Utilized During Treatment: Gait belt;Cervical collar Activity Tolerance: Patient limited by pain;Patient limited by fatigue Patient left: in bed;with call bell/phone within reach;with family/visitor present Nurse Communication: Mobility status;Precautions         Time:  4627-0350 PT Time Calculation (min) (ACUTE ONLY): 31 min   Charges:   PT Evaluation $Initial PT Evaluation Tier I: 1 Procedure PT Treatments $Gait Training: 8-22 mins   PT G CodesCandy Sledge A 09/01/2014, 10:33 AM Candy Sledge, [place], DPT [phone]

## 2014-09-01 NOTE — Progress Notes (Signed)
Occupational Therapy Evaluation Patient Details Name: Latoya [name] MRN: [id number] DOB: 24-Nov-1945 Today's Date: 09/01/2014    History of Present Illness 68 y.o. female s/p Right C6-7 foraminotomy, C5-T 1 fusion. Aspen collar to be worn. PMH of HTN, CAD, PE, DVT and Antiphospholipid syndrome.    Clinical Impression   Pt is s/p R C6-7 foraminotomy and C5-T1 fusion surgery resulting in functional limitations due to the deficits listed below (see OT problem list). Pt and husband were packing for d/c on OT arrival. Pt eager to demonstrate knowledge of and adherence to cervical precautions. Pt using rollator during static and dynamic standing. Pt demonstrating some balance/safety concerns such as leaving rollator out of reach, and leaning on handles when tired. Pt will benefit from skilled OT acutely to increase independence and safety with ADLS to allow discharge home.     Follow Up Recommendations  Home health OT;Supervision/Assistance - 24 hour    Equipment Recommendations  None recommended by OT    Recommendations for Other Services       Precautions / Restrictions Precautions Precautions: Cervical;Fall Precaution Comments: Cervical precautions handout provided. Pt and husband able to verbalize 3/3 precautions Required Braces or Orthoses: Cervical Brace Cervical Brace: Hard collar Restrictions Weight Bearing Restrictions: No      Mobility Bed Mobility Overal bed mobility: Needs Assistance Bed Mobility: Rolling;Sidelying to Sit;Sit to Sidelying Rolling: Supervision Sidelying to sit: Min guard;HOB elevated     Sit to sidelying: Mod assist;HOB elevated General bed mobility comments: Pt sitting EOB on OT arrival  Transfers Overall transfer level: Needs assistance Equipment used: 4-wheeled walker Transfers: Sit to/from Stand Sit to Stand: Min guard         General transfer comment: Min guard (A) for safety. Verbal cues for hand placement and to avoid twisting to  locate toilet before sitting. Pt stood from EOB x1 and from toilet x1. Pt demonstrating good use of rollator brakes.     Balance Overall balance assessment: Needs assistance Sitting-balance support: No upper extremity supported;Feet supported Sitting balance-Leahy Scale: Fair     Standing balance support: Bilateral upper extremity supported;During functional activity Standing balance-Leahy Scale: Poor Standing balance comment: Requires BUE support on surfaces (rollator, grab bars, countertop) for safety/balance                            ADL Overall ADL's : Needs assistance/impaired     Grooming: Wash/dry hands;Min guard;Standing;Brushing hair                   Toilet Transfer: Min guard;Ambulation;Regular Toilet;RW;Grab bars   Toileting- Clothing Manipulation and Hygiene: Maximal assistance;Sit to/from stand Toileting - Clothing Manipulation Details (indicate cue type and reason): Pt requesting (A) to pull up underwear and pants while standing. Max (A) provided.      Functional mobility during ADLs: Min guard;Rolling walker General ADL Comments: Min guard (A) for mobility and ADLs. Pt needs verbal cues to adhere to cervical precautions. Pt demonstrating safe use of rollator and brakes. Pt educated on compensatory techniques for precautions and pain management strategies during the night. Pt stating pain is more tolerable if she does not move UE's. Pt requesting (A) from husband to reach/hand her objects.     Vision                     Perception     Praxis      Pertinent Vitals/Pain Pain Assessment: 0-10 Pain Score:  10-Worst pain ever Pain Location: neck Pain Descriptors / Indicators: Aching;Sore Pain Intervention(s): Limited activity within patient's tolerance;Monitored during session;Repositioned     Hand Dominance Right   Extremity/Trunk Assessment Upper Extremity Assessment Upper Extremity Assessment: Generalized weakness   Lower  Extremity Assessment Lower Extremity Assessment: Defer to PT evaluation   Cervical / Trunk Assessment Cervical / Trunk Assessment: Kyphotic   Communication Communication Communication: No difficulties   Cognition Arousal/Alertness: Awake/alert Behavior During Therapy: WFL for tasks assessed/performed Overall Cognitive Status: Within Functional Limits for tasks assessed                     General Comments       Exercises       Shoulder Instructions      Home Living Family/patient expects to be discharged to:: Private residence Living Arrangements: Spouse/significant other Available Help at Discharge: Family;Available 24 hours/day Type of Home: House Home Access: Stairs to enter [employer] of Steps: 4 Entrance Stairs-Rails: Right;Left Home Layout: One level     Bathroom Shower/Tub: Walk-in shower;Door   Bathroom Toilet: Handicapped height     Home Equipment: Environmental consultant - 2 wheels;Walker - 4 wheels;Cane - quad;Bedside commode;Grab bars - toilet;Grab bars - tub/shower;Hand held shower head;Shower seat;Other (comment) (Hip kit)          Prior Functioning/Environment Level of Independence: Independent with assistive device(s)        Comments: Pt reports using scooter for community mobility and rollator/RW for household ambulation.    OT Diagnosis: Generalized weakness;Acute pain   OT Problem List: Decreased strength;Decreased activity tolerance;Impaired balance (sitting and/or standing);Decreased coordination;Decreased safety awareness;Decreased knowledge of use of DME or AE;Pain   OT Treatment/Interventions: Self-care/ADL training;Therapeutic exercise;DME and/or AE instruction;Therapeutic activities;Patient/family education;Balance training    OT Goals(Current goals can be found in the care plan section) Acute Rehab OT Goals Patient Stated Goal: to go home OT Goal Formulation: With patient Time For Goal Achievement: 09/15/14 Potential to  Achieve Goals: Good ADL Goals Pt Will Perform Grooming: with modified independence;sitting Pt Will Perform Upper Body Bathing: with modified independence;sitting Pt Will Perform Lower Body Bathing: with adaptive equipment;with supervision;sitting/lateral leans;sit to/from stand Pt Will Transfer to Toilet: with supervision;ambulating;bedside commode;grab bars Pt Will Perform Toileting - Clothing Manipulation and hygiene: with supervision;sit to/from stand Additional ADL Goal #1: Pt will verbalize 3/3 cervical precautions independently.  OT Frequency: Min 2X/week   Barriers to D/C:            Co-evaluation              End of Session Equipment Utilized During Treatment: Gait belt;Cervical collar;Other (comment) Agricultural consultant) Nurse Communication: Mobility status;Precautions  Activity Tolerance: Patient tolerated treatment well Patient left: in bed;with call bell/phone within reach;with family/visitor present   Time: 7517-0017 OT Time Calculation (min): 21 min Charges:    G-Codes:    Redmond Baseman Sep 21, 2014, 11:35 AM

## 2014-09-01 NOTE — Discharge Summary (Signed)
Physician Discharge Summary  Patient ID: Latoya [name] MRN: [id number] DOB/AGE: 02/09/1946 68 y.o.  Admit date: 08/31/2014 Discharge date: 09/01/2014  Admission Diagnoses: Cervical radiculopathy, Cervicalgia, Pseudoarthrosis C 6 C 7 level   Discharge Diagnoses: Cervical radiculopathy, Cervicalgia, Pseudoarthrosis C 6 C 7 level s/p Right cervical six-seven foraminotomy, Cervical five-Thoracic one fusion, Cervical six-seven lateral mass screws   Active Problems:   Cervical pseudoarthrosis   Discharged Condition: good  Hospital Course: Latoya [name] was admitted for surgery with dx pseudoarthrosis and radiculopathy. Following uncomplicated foraminotomy and posterior cervical fusion, she recovered nicely and transferred to [facility] for nursing care.   Consults: None  Significant Diagnostic Studies: radiology: X-Ray: intra-operative  Treatments: surgery: Right cervical six-seven foraminotomy, Cervical five-Thoracic one fusion, Cervical six-seven lateral mass screws    Discharge Exam: Blood pressure 117/48, pulse 80, temperature 98 F (36.7 C), temperature source Oral, resp. rate 18, height 5' 2.5" (1.588 m), weight 70.308 kg (155 lb), SpO2 93 %. Alert, conversant, reporting trap pain as expected. MAEW. Good strength BUE. Drsg intact without erythema, swelling, or drainage. Hemovac clotted - pulled. She notes some urinary hesitancy overnight, but has voided well.   Disposition: 01-Home or Self Care  Will call in [place] to [facility].  She has pain meds for prn use at home. She verballizes understanding of d/c instructions and agrees to call office to schedule 3-4 week visit.       Medication List    TAKE these medications        albuterol 108 (90 BASE) MCG/ACT inhaler  Commonly known as:  PROVENTIL HFA;VENTOLIN HFA  Inhale 2 puffs into the lungs every 6 (six) hours as needed for wheezing or shortness of breath.     aspirin 81 MG EC tablet  Take 81 mg by mouth  daily.     ASTELIN 0.1 % nasal spray  Generic drug:  azelastine  - Place 1 spray into the nose 3 (three) times daily as needed. Use in each nostril as directed  - For allergies     atorvastatin 20 MG tablet  Commonly known as:  LIPITOR  Take 20 mg by mouth daily.     beclomethasone 80 MCG/ACT inhaler  Commonly known as:  QVAR  Inhale 2 puffs into the lungs 2 (two) times daily.     Calcium Carbonate-Vitamin D 600-400 MG-UNIT per tablet  Take 1 tablet by mouth 2 (two) times daily.     CLARITIN-D 12 HOUR PO  Take 1 tablet by mouth 2 (two) times daily.     cyclobenzaprine 10 MG tablet  Commonly known as:  FLEXERIL  Take 10 mg by mouth 3 (three) times daily as needed. For severe muscle pain     diazepam 5 MG tablet  Commonly known as:  VALIUM  Take 5 mg by mouth at bedtime as needed for anxiety or sedation.     docusate sodium 100 MG capsule  Commonly known as:  COLACE  Take 100 mg by mouth 2 (two) times daily.     EPINEPHrine 0.3 mg/0.3 mL Soaj injection  Commonly known as:  EPIPEN  Inject 0.3 mLs (0.3 mg total) into the muscle once.     esomeprazole 40 MG capsule  Commonly known as:  NEXIUM  Take 40 mg by mouth 2 (two) times daily.     fentaNYL 75 MCG/HR  Commonly known as:  DURAGESIC - dosed mcg/hr  Place 75 mcg onto the skin See admin instructions. Change every 2 days.  FLUTTER Devi  Use twice daily as needed for respiratory.     GLUCOSAMINE PO  Take 2 tablets by mouth 2 (two) times daily. Glucosamine ASU     HYDROmorphone 4 MG tablet  Commonly known as:  DILAUDID  Take 4-8 mg by mouth every 4 (four) hours as needed. For pain     levothyroxine 75 MCG tablet  Commonly known as:  SYNTHROID, LEVOTHROID  Take 75 mcg by mouth daily before breakfast.     lidocaine 5 %  Commonly known as:  LIDODERM  Place 1 patch onto the skin as needed (pain). Remove & Discard patch within 12 hours or as directed by MD     metaxalone 800 MG tablet  Commonly known as:   SKELAXIN  Take 800 mg by mouth every 8 (eight) hours as needed. For muscle pain     metoprolol 50 MG tablet  Commonly known as:  LOPRESSOR  Take 1 tablet (50 mg total) by mouth 2 (two) times daily.     montelukast 10 MG tablet  Commonly known as:  SINGULAIR  Take 10 mg by mouth at bedtime.     multivitamin with minerals Tabs tablet  Take 1 tablet by mouth daily.     NEURONTIN 600 MG tablet  Generic drug:  gabapentin  Take 600 mg by mouth at bedtime.     nitroGLYCERIN 0.4 MG SL tablet  Commonly known as:  NITROSTAT  Place 1 tablet (0.4 mg total) under the tongue every 5 (five) minutes as needed. For chest pain     triamcinolone 55 MCG/ACT nasal inhaler  Commonly known as:  NASACORT  Place 1-2 sprays into the nose 2 (two) times daily.         Signed: Verdis Prime 09/01/2014, 8:40 AM

## 2014-09-01 NOTE — Progress Notes (Signed)
Bedside RN went over discharge instructions with pt and pt's husband. Pt packing her belongings and will call when she is ready for volunteers to take her downstairs.

## 2014-09-01 NOTE — Progress Notes (Signed)
Subjective: Patient reports feeling better  Objective: Vital signs in last 24 hours: Temp:  [97.7 F (36.5 C)-98.6 F (37 C)] 98 F (36.7 C) (11/11 0645) Pulse Rate:  [72-90] 80 (11/11 0645) Resp:  [12-18] 18 (11/11 0645) BP: (114-167)/(46-91) 117/48 mmHg (11/11 0645) SpO2:  [93 %-100 %] 93 % (11/11 0645)  Intake/Output from previous day: 11/10 0701 - 11/11 0700 In: 1700 [I.V.:1700] Out: 620 [Urine:350; Drains:120; Blood:150] Intake/Output this shift:    Physical Exam: Full strength both upper extremities.  Dressing CDI.  Lab Results: No results for input(s): WBC, HGB, HCT, PLT in the last 72 hours. BMET No results for input(s): NA, K, CL, CO2, GLUCOSE, BUN, CREATININE, CALCIUM in the last 72 hours.  Studies/Results: Dg Cervical Spine 2-3 Views  08/31/2014   CLINICAL DATA:  C6-7 fusion  EXAM: DG C-ARM 61-120 MIN; CERVICAL SPINE - 2-3 VIEW  COMPARISON:  None.  FINDINGS: Three spot films were obtained during a fusion at the C6-7 level in anterior fixation plate with multiple fixation screws is noted. Visualization of the actual bony level is difficult due to overlying show shoulder girdles  IMPRESSION: Intraoperative cervical fusion imaging   Electronically Signed   By: Inez Catalina M.D.   On: 08/31/2014 12:25   Dg C-arm 1-60 Min  08/31/2014   CLINICAL DATA:  C6-7 fusion  EXAM: DG C-ARM 61-120 MIN; CERVICAL SPINE - 2-3 VIEW  COMPARISON:  None.  FINDINGS: Three spot films were obtained during a fusion at the C6-7 level in anterior fixation plate with multiple fixation screws is noted. Visualization of the actual bony level is difficult due to overlying show shoulder girdles  IMPRESSION: Intraoperative cervical fusion imaging   Electronically Signed   By: Inez Catalina M.D.   On: 08/31/2014 12:25    Assessment/Plan: Doing well.  Mobilizing.  Discharge home.    LOS: 1 day    Peggyann Shoals, MD 09/01/2014, 8:38 AM

## 2014-09-13 ENCOUNTER — Ambulatory Visit: Payer: Medicare Other | Admitting: Cardiovascular Disease

## 2014-09-22 ENCOUNTER — Encounter: Payer: Self-pay | Admitting: Cardiology

## 2014-09-22 DIAGNOSIS — E559 Vitamin D deficiency, unspecified: Secondary | ICD-10-CM | POA: Diagnosis not present

## 2014-09-22 DIAGNOSIS — E039 Hypothyroidism, unspecified: Secondary | ICD-10-CM | POA: Diagnosis not present

## 2014-09-22 DIAGNOSIS — Z0000 Encounter for general adult medical examination without abnormal findings: Secondary | ICD-10-CM | POA: Diagnosis not present

## 2014-09-22 DIAGNOSIS — M5023 Other cervical disc displacement, cervicothoracic region: Secondary | ICD-10-CM | POA: Diagnosis not present

## 2014-09-22 DIAGNOSIS — M542 Cervicalgia: Secondary | ICD-10-CM | POA: Diagnosis not present

## 2014-09-22 DIAGNOSIS — R7301 Impaired fasting glucose: Secondary | ICD-10-CM | POA: Diagnosis not present

## 2014-09-22 DIAGNOSIS — Z1389 Encounter for screening for other disorder: Secondary | ICD-10-CM | POA: Diagnosis not present

## 2014-09-22 DIAGNOSIS — Z981 Arthrodesis status: Secondary | ICD-10-CM | POA: Diagnosis not present

## 2014-09-22 DIAGNOSIS — I10 Essential (primary) hypertension: Secondary | ICD-10-CM | POA: Diagnosis not present

## 2014-09-22 DIAGNOSIS — E785 Hyperlipidemia, unspecified: Secondary | ICD-10-CM | POA: Diagnosis not present

## 2014-09-24 DIAGNOSIS — Z08 Encounter for follow-up examination after completed treatment for malignant neoplasm: Secondary | ICD-10-CM | POA: Diagnosis not present

## 2014-09-24 DIAGNOSIS — D225 Melanocytic nevi of trunk: Secondary | ICD-10-CM | POA: Diagnosis not present

## 2014-09-24 DIAGNOSIS — L821 Other seborrheic keratosis: Secondary | ICD-10-CM | POA: Diagnosis not present

## 2014-09-24 DIAGNOSIS — Z85828 Personal history of other malignant neoplasm of skin: Secondary | ICD-10-CM | POA: Diagnosis not present

## 2014-10-06 ENCOUNTER — Ambulatory Visit: Payer: Medicare Other | Admitting: Critical Care Medicine

## 2014-10-12 ENCOUNTER — Encounter: Payer: Self-pay | Admitting: *Deleted

## 2014-10-18 ENCOUNTER — Ambulatory Visit (INDEPENDENT_AMBULATORY_CARE_PROVIDER_SITE_OTHER): Payer: Medicare Other | Admitting: Cardiovascular Disease

## 2014-10-18 ENCOUNTER — Encounter: Payer: Self-pay | Admitting: Cardiovascular Disease

## 2014-10-18 VITALS — BP 142/98 | HR 74 | Ht 62.5 in | Wt 155.0 lb

## 2014-10-18 DIAGNOSIS — I10 Essential (primary) hypertension: Secondary | ICD-10-CM | POA: Diagnosis not present

## 2014-10-18 DIAGNOSIS — I2510 Atherosclerotic heart disease of native coronary artery without angina pectoris: Secondary | ICD-10-CM

## 2014-10-18 DIAGNOSIS — I6523 Occlusion and stenosis of bilateral carotid arteries: Secondary | ICD-10-CM

## 2014-10-18 DIAGNOSIS — I480 Paroxysmal atrial fibrillation: Secondary | ICD-10-CM | POA: Diagnosis not present

## 2014-10-18 DIAGNOSIS — E785 Hyperlipidemia, unspecified: Secondary | ICD-10-CM

## 2014-10-18 NOTE — Progress Notes (Signed)
History of Present Illness: 68 yo female with HTN, hyperlipidemia, anti-phospholipid antibody syndrome, GERD, asthma, CAD s/p cath 2003 with 30% ostial LM stenosis, 50% mid LAD, 80% moderate sized Diagonal, 80% Circumflex. There was discussion about bypass at that time but medical management was pursued and she has done well with this plan over the years. She also has chronic back pain and paroxysmal atrial fibrillation. She has been previously followed by Dr. Johnsie Cancel in our office. She has a hypercoagulable state but has not been managed on coumadin because of prior intolerance. She has allergies to heparin (anaphylaxis) and Lovenox. Her coumadin had been stopped in the past because of trouble regulating her dose. She is seen by a hematologist at [facility] (Dr. Donney Dice) and he has told her that she does not need coumadin for her hypercoagulable state. We discussed Xarelto or Eliquis for PAF but she did not wish to start. She had been on amiodarone in the past but this was stopped in August 2010 by Dr. Johnsie Cancel. She has been diagnosed with hypothyroidism and has been on Synthroid. She has had no lower extremity edema but she does have chronic problems with pain in her feet from Raynaud's phenomenon and ongoing sciatica. Most recent stress test October 2013 with no ischemia, normal LV function.  Last echo June 2014 with normal LV size, mild LVH, normal LV systolic function with EF of 55%, mild MR with mildly thickened mitral valve leaflets. Carotid artery dopplers 12/16/13 with stable 40-59% bilateral stenosis. She had several bouts with pneumonia Fall of 2015. Underwent cervical spine surgery November 2015.   She tells me today that she has been feeling well. She has had no chest pain. She has had 4 episodes of pneumonia over the last 4 months.  Breathing is back to baseline. Neck is feeling better.   Primary Care Physician: Trilby Drummer   Last Lipid Profile: Followed in primary care.  Past Medical History    Diagnosis Date  . Asthma     extrinsic; moderate, persistant, nml spirometry 2010, nml CXR 1/08  . Pulmonary embolism     related to back surgery with prior coumadin use, now off  . DVT (deep venous thrombosis)   . PAF (paroxysmal atrial fibrillation)     not on coumadin therapy  . HLD (hyperlipidemia)   . CAD (coronary artery disease)     50% mid LAD, 80% ostial D1 and moderate 80% mid circ by vath 2003  . Antiphospholipid syndrome     with hypercoagulable state  . Coronary heart disease   . Drug allergy     heparin/lovenox  . Erosive gastritis 1994  . Anemia, iron deficiency   . Hypothyroidism   . Bladder troubles     REPORTS INFECTIONS ON OCCASION DUE TO URETHRA MEATUS STRICTURE AT BIRTH   . H/O hiatal hernia   . Spondylosis, lumbosacral     ARTHRITIS- OA   . Cancer     basal cell removed fr. L arm   . Liver spot     cyst- - no biopsy, but told that its benign   . GERD (gastroesophageal reflux disease)   . Complication of anesthesia     cardiac arrest- in OR, at age 81 (43)y.o. during Scalenotomy in her early 20's  . Family history of adverse reaction to anesthesia     some family members have had trouble waking up  . HTN (hypertension)     [name], manages pt.  LOV 03/2014  .  Blood dyscrasia     antiphospholipid disorder    Past Surgical History  Procedure Laterality Date  . Total knee arthroplasty      left  . Cleft lip and palate repair      68 yo   . Hysterectomy    . Spina bifida repair      68 yo   . Cardiac catheterization      2005  . Joint replacement    . Tonsillectomy    . Breast surgery      all benign cysts x3   . Abdominal hysterectomy      partial abdominal -1998  . Back surgery      x13 cervical and lumbar thoracic spine surgery  . Breast enhancement surgery    . Posterior cervical fusion/foraminotomy Right 08/31/2014    Procedure: Right cervical six-seven foraminotomy, Cervical five-Thoracic one fusion, Cervical six-seven  lateral mass screws;  Surgeon: Erline Levine, MD;  Location: [facility];  Service: Neurosurgery;  Laterality: Right;    Current Outpatient Prescriptions  Medication Sig Dispense Refill  . albuterol (PROVENTIL HFA;VENTOLIN HFA) 108 (90 BASE) MCG/ACT inhaler Inhale 2 puffs into the lungs every 6 (six) hours as needed for wheezing or shortness of breath.     Marland Kitchen aspirin 81 MG EC tablet Take 81 mg by mouth daily.     Marland Kitchen atorvastatin (LIPITOR) 20 MG tablet Take 20 mg by mouth daily.    Marland Kitchen azelastine (ASTELIN) 137 MCG/SPRAY nasal spray Place 1 spray into the nose 3 (three) times daily as needed. Use in each nostril as directed For allergies    . beclomethasone (QVAR) 80 MCG/ACT inhaler Inhale 2 puffs into the lungs 2 (two) times daily. 1 Inhaler 11  . Calcium Carbonate-Vitamin D 600-400 MG-UNIT per tablet Take 1 tablet by mouth 2 (two) times daily.      . cyclobenzaprine (FLEXERIL) 10 MG tablet Take 10 mg by mouth 3 (three) times daily as needed. For severe muscle pain    . diazepam (VALIUM) 5 MG tablet Take 5 mg by mouth at bedtime as needed for anxiety or sedation.     . docusate sodium (COLACE) 100 MG capsule Take 100 mg by mouth 2 (two) times daily.     Marland Kitchen EPINEPHrine (EPIPEN) 0.3 mg/0.3 mL DEVI Inject 0.3 mLs (0.3 mg total) into the muscle once. (Patient taking differently: Inject 0.3 mg into the muscle once as needed. ) 1 Device 2  . esomeprazole (NEXIUM) 40 MG capsule Take 40 mg by mouth 2 (two) times daily.     . fentaNYL (DURAGESIC - DOSED MCG/HR) 75 MCG/HR Place 75 mcg onto the skin See admin instructions. Change every 2 days.    Marland Kitchen gabapentin (NEURONTIN) 600 MG tablet Take 600 mg by mouth at bedtime.     . Glucosamine HCl (GLUCOSAMINE PO) Take 2 tablets by mouth 2 (two) times daily. Glucosamine ASU    . HYDROmorphone (DILAUDID) 4 MG tablet Take 4-8 mg by mouth every 4 (four) hours as needed. For pain    . levothyroxine (SYNTHROID, LEVOTHROID) 75 MCG tablet Take 75 mcg by mouth daily before  breakfast.     . lidocaine (LIDODERM) 5 % Place 1 patch onto the skin as needed (pain). Remove & Discard patch within 12 hours or as directed by MD    . Loratadine-Pseudoephedrine (CLARITIN-D 12 HOUR PO) Take 1 tablet by mouth 2 (two) times daily.     . metaxalone (SKELAXIN) 800 MG tablet Take 800 mg by  mouth every 8 (eight) hours as needed. For muscle pain    . metoprolol (LOPRESSOR) 50 MG tablet Take 1 tablet (50 mg total) by mouth 2 (two) times daily. 180 tablet 4  . montelukast (SINGULAIR) 10 MG tablet Take 10 mg by mouth at bedtime.     . Multiple Vitamin (MULTIVITAMIN WITH MINERALS) TABS Take 1 tablet by mouth daily.    . nitroGLYCERIN (NITROSTAT) 0.4 MG SL tablet Place 1 tablet (0.4 mg total) under the tongue every 5 (five) minutes as needed. For chest pain 25 tablet 3  . Respiratory Therapy Supplies (FLUTTER) DEVI Use twice daily as needed for respiratory.    . triamcinolone (NASACORT) 55 MCG/ACT nasal inhaler Place 1-2 sprays into the nose 2 (two) times daily.      No current facility-administered medications for this visit.    Allergies  Allergen Reactions  . Hornet Venom Anaphylaxis  . Pork-Derived Products Anaphylaxis  . Chlorhexidine Itching    Reports that it is the dye in it  . Enoxaparin Sodium     REACTION: thrombocytopenia  . Heparin     REACTION: thrombocytopenia  . Lactose Intolerance (Gi)   . Other     Sensitive to dye in Betadine & Chlorohexadine   . Povidone-Iodine     Sensitivity- but if its wiped off she is able to tolerate betadine   . Indomethacin Rash  . Phenylpropanolamine Rash    Other Reaction: other reaction  . Povidone Iodine Rash    History   Social History  . Marital Status: Married    Spouse Name: N/A    Number of Children: N/A  . Years of Education: N/A   Occupational History  . Not on file.   Social History Main Topics  . Smoking status: Never Smoker   . Smokeless tobacco: Never Used     Comment: no smoking   . Alcohol Use: No    . Drug Use: No  . Sexual Activity: Not on file   Other Topics Concern  . Not on file   Social History Narrative   Lives in [place] with husband; has had miscarriages but no children.    Disabled but exercises nearly every day (can only exericse in water - aerobics)   Takes opioids for pain relief; takes no herbal medications; has a heart healthy diet.     Family History  Problem Relation Age of Onset  . Heart disease Father     MI   . Heart disease Sister   .       Review of Systems:  As stated in the HPI and otherwise negative.   BP 142/98 mmHg  Pulse 74  Ht 5' 2.5" (1.588 m)  Wt 155 lb (70.308 kg)  BMI 27.88 kg/m2  SpO2 98%  Physical Examination: General: Well developed, well nourished, NAD HEENT: OP clear, mucus membranes moist SKIN: warm, dry. No rashes. Neuro: No focal deficits Musculoskeletal: Muscle strength 5/5 all ext Psychiatric: Mood and affect normal Neck: No JVD, no carotid bruits, no thyromegaly, no lymphadenopathy. Lungs:Clear bilaterally, no wheezes, rhonci, crackles Cardiovascular: Regular rate and rhythm. No murmurs, gallops or rubs. Abdomen:Soft. Bowel sounds present. Non-tender.  Extremities: No lower extremity edema. Pulses are trace in the bilateral DP/PT.    Echo 04/12/13: Left ventricle: The cavity size was normal. Systolic function was normal. The estimated ejection fraction was in the range of 50% to 55%.   ABI: Normal June 2014  Carotid artery dopplers: February 2015: Moderate bilateral stenosis, 40-59%  stenosis bilateral ICA  Stress myoview: 08/13/12: Stress Procedure: The patient received IV Lexiscan 0.4 mg over 15-seconds. Technetium 16m Sestamibi injected at 30-seconds. There were significant changes with Lexiscan. The patient complained of chest pain with Lexiscan. Quantitative spect images were obtained after a 45 minute delay.  The patient complained of persistent headache with flushing of face and neck after Lexiscan that was  relieved quickly after Aminophylline 75 mg IVP given.  Stress ECG: No significant change from baseline ECG  QPS  Raw Data Images: Patient motion noted.  Stress Images: lateral wall not interpretable due to bowel artifact  Rest Images: lateral wall not interpretable due to bowel artifact  Subtraction (SDS): Normal  Transient Ischemic Dilatation (Normal <1.22): 1.03  Lung/Heart Ratio (Normal <0.45): 0.45  Quantitative Gated Spect Images  QGS EDV: 95 ml  QGS ESV: 50 ml  Impression  Exercise Capacity: Lexiscan with no exercise.  BP Response: Normal blood pressure response.  Clinical Symptoms: Headache requiring amynophylline  ECG Impression: No significant ST segment change suggestive of ischemia.  Comparison with Prior Nuclear Study: No images to compare  Overall Impression: Apical thinning no obvious ischemia Lateral wall not interpretable due to a large area of bowel artifact on both resting and stress images  LV Ejection Fraction: 47%. LV Wall Motion: Apical hypokinesis  Cardiac cath 09/22/02: 1. Ventriculography was performed in the RAO projection. Overall ejection  fraction was 70%. No segmental wall motion abnormalities were  identified.  2. The left main coronary artery has, what appears to be, about 30%  narrowing at the ostium. In most views there does appear to be a patent  ostium; however, some tapered narrowing cannot be excluded.  3. The LAD proper has about 40-50% narrowing, at most, in the mid portion  beyond the diagonal takeoff. The vessel is calcified. The diagonal  takeoff itself has a long 80% stenosis at the ostium extending out into  the mid portion of the diagonal. The distal diagonal does appear to be  suitable for grafting.  4. The large circumflex has about 70-80% focal narrowing prior to the  bifurcation of the marginal.  5. The right coronary artery has some mild luminal irregularities but no  high-grade stenoses.  IMPRESSION:  1. Coronary artery disease  with significant involvement and circumflex  involvement.  2. Multiple medical issues, as described above.  EKG:  NSR, rate 74 bpm. Non-specific ST abnormalities.   Assessment and Plan:   1. CAD: Stable. Stress test 2013 without ischemia. No chest pain. She is known to have moderate CAD. Continue current medical management   2. PAROXYSMAL ATRIAL FIBRILLATION: Sinus today. Occasional palpitations. She has not been on coumadin because she refuses due to prior issues regulating levels. I have once again discussed one of the newer agents and she will consider. She will consider Xarelto or Eliquis in the future and will call if she decides to try this.   3. HYPERTENSION: BP is controlled at home. No changes  4. CAROTID ARTERY DISEASE: Stable by dopplers 2/15. No changes. Repeat Feburary 2016  5. Hyperlipidemia: Continue statin. Lipids followed in primary care.

## 2014-10-18 NOTE — Patient Instructions (Signed)
Your physician wants you to follow-up in: 6 months. You will receive a reminder letter in the mail two months in advance. If you don't receive a letter, please call our office to schedule the follow-up appointment.  Your physician has requested that you have a carotid duplex. This test is an ultrasound of the carotid arteries in your neck. It looks at blood flow through these arteries that supply the brain with blood. Allow one hour for this exam. There are no restrictions or special instructions. To be in March 2016

## 2014-10-20 ENCOUNTER — Ambulatory Visit: Payer: Medicare Other | Admitting: Critical Care Medicine

## 2014-10-21 ENCOUNTER — Telehealth: Payer: Self-pay | Admitting: *Deleted

## 2014-10-21 ENCOUNTER — Telehealth: Payer: Self-pay | Admitting: Cardiovascular Disease

## 2014-10-21 DIAGNOSIS — I2510 Atherosclerotic heart disease of native coronary artery without angina pectoris: Secondary | ICD-10-CM

## 2014-10-21 MED ORDER — ATORVASTATIN CALCIUM 20 MG PO TABS: 20.0000 mg | ORAL_TABLET | Freq: Every day | ORAL | Status: DC

## 2014-10-21 MED ORDER — METOPROLOL TARTRATE 50 MG PO TABS: 50.0000 mg | ORAL_TABLET | Freq: Two times a day (BID) | ORAL | Status: DC

## 2014-10-21 NOTE — Telephone Encounter (Signed)
Walk in pt form " pt needs refill" gave to [facility]

## 2014-10-21 NOTE — Telephone Encounter (Signed)
Received walk in pt form requesting refill of Atorvastatin and metoprolol to [facility]. Will send refills in.

## 2014-10-27 DIAGNOSIS — M4722 Other spondylosis with radiculopathy, cervical region: Secondary | ICD-10-CM | POA: Diagnosis not present

## 2014-10-27 DIAGNOSIS — M963 Postlaminectomy kyphosis: Secondary | ICD-10-CM | POA: Diagnosis not present

## 2014-10-27 DIAGNOSIS — G894 Chronic pain syndrome: Secondary | ICD-10-CM | POA: Diagnosis not present

## 2014-10-27 DIAGNOSIS — M960 Pseudarthrosis after fusion or arthrodesis: Secondary | ICD-10-CM | POA: Diagnosis not present

## 2014-11-22 ENCOUNTER — Ambulatory Visit: Payer: Medicare Other | Admitting: Critical Care Medicine

## 2014-12-30 ENCOUNTER — Ambulatory Visit (INDEPENDENT_AMBULATORY_CARE_PROVIDER_SITE_OTHER): Payer: Medicare Other | Admitting: Family Medicine

## 2014-12-30 ENCOUNTER — Ambulatory Visit (INDEPENDENT_AMBULATORY_CARE_PROVIDER_SITE_OTHER): Payer: Medicare Other

## 2014-12-30 VITALS — BP 140/84 | HR 100 | Temp 97.4°F | Resp 18 | Wt 155.6 lb

## 2014-12-30 DIAGNOSIS — Z8701 Personal history of pneumonia (recurrent): Secondary | ICD-10-CM

## 2014-12-30 DIAGNOSIS — R0781 Pleurodynia: Secondary | ICD-10-CM

## 2014-12-30 DIAGNOSIS — R079 Chest pain, unspecified: Secondary | ICD-10-CM | POA: Diagnosis not present

## 2014-12-30 DIAGNOSIS — R61 Generalized hyperhidrosis: Secondary | ICD-10-CM | POA: Diagnosis not present

## 2014-12-30 LAB — POCT CBC
Granulocyte percent: 61.8 %G (ref 37–80)
HEMATOCRIT: 44.1 % (ref 37.7–47.9)
Hemoglobin: 13.8 g/dL (ref 12.2–16.2)
LYMPH, POC: 2 (ref 0.6–3.4)
MCH, POC: 26.8 pg — AB (ref 27–31.2)
MCHC: 31.3 g/dL — AB (ref 31.8–35.4)
MCV: 85.6 fL (ref 80–97)
MID (cbc): 0.5 (ref 0–0.9)
MPV: 7 fL (ref 0–99.8)
POC Granulocyte: 4 (ref 2–6.9)
POC LYMPH PERCENT: 31 %L (ref 10–50)
POC MID %: 7.2 % (ref 0–12)
Platelet Count, POC: 208 10*3/uL (ref 142–424)
RBC: 5.16 M/uL (ref 4.04–5.48)
RDW, POC: 15 %
WBC: 6.5 10*3/uL (ref 4.6–10.2)

## 2014-12-30 MED ORDER — LEVOFLOXACIN 500 MG PO TABS: 500.0000 mg | ORAL_TABLET | Freq: Every day | ORAL | Status: DC

## 2014-12-30 NOTE — Patient Instructions (Signed)
Try and work hard on deep breathing and coughing despite the pain  If the pain is getting worse further studies will have to be done  Take Levaquin 500 mg one daily  Advise follow-up chest x-ray when you see her physician back.

## 2014-12-30 NOTE — Progress Notes (Signed)
Subjective: 69 year old lady who is here with a pain under right rib cage. This been bothering her since Monday. She has not had a lot of congestion or cough. Does hurt if she coughs or breathes deep. She hurts right under the right rib cage. This is the same way she her last year when she had a persistent pneumonia problem in the spring and then again in the fall had another one. She has had multiple spine surgeries over the years, and that his disabled her good deal. She does not smoke. She has not had any obvious fevers, though thinks she may have broken some. She has not had a lot of head congestion or sore throat.  Objective: TMs normal. Partially occluded by some wax. Her throat is clear. Neck supple without significant nodes. Chest is clear to auscultation. Heart regular without murmurs. Chest wall not particular tender. Abdomen soft without masses. It is hard for her to lay real good and flat because of her back. However did not get a sense of any significant tenderness under her right rib cage and do not feel any masses there.  Assessment: Right chest pain History of recurrent pneumonias  Plan: Check CBC and chest x-ray  Results for orders placed or performed in visit on 12/30/14  POCT CBC  Result Value Ref Range   WBC 6.5 4.6 - 10.2 K/uL   Lymph, poc 2.0 0.6 - 3.4   POC LYMPH PERCENT 31.0 10 - 50 %L   MID (cbc) 0.5 0 - 0.9   POC MID % 7.2 0 - 12 %M   POC Granulocyte 4.0 2 - 6.9   Granulocyte percent 61.8 37 - 80 %G   RBC 5.16 4.04 - 5.48 M/uL   Hemoglobin 13.8 12.2 - 16.2 g/dL   HCT, POC 44.1 37.7 - 47.9 %   MCV 85.6 80 - 97 fL   MCH, POC 26.8 (A) 27 - 31.2 pg   MCHC 31.3 (A) 31.8 - 35.4 g/dL   RDW, POC 15.0 %   Platelet Count, POC 208 142 - 424 K/uL   MPV 7.0 0 - 99.8 fL   [facility] reading (PRIMARY) by  Dr. Linna Darner. Elevated right hemidiaphragm with right-sided atelectasis, unchanged from previous film  With her having the sweats and feeling like she did with previous  pneumonias decided to go ahead and treat her with antibiotics. We will see what the radiologist reads.  Has tolerated Levaquin well in the past, so will go with that. Return if in all worse.

## 2015-01-05 DIAGNOSIS — M963 Postlaminectomy kyphosis: Secondary | ICD-10-CM | POA: Diagnosis not present

## 2015-01-05 DIAGNOSIS — G894 Chronic pain syndrome: Secondary | ICD-10-CM | POA: Diagnosis not present

## 2015-01-05 DIAGNOSIS — Z79891 Long term (current) use of opiate analgesic: Secondary | ICD-10-CM | POA: Diagnosis not present

## 2015-01-05 DIAGNOSIS — M960 Pseudarthrosis after fusion or arthrodesis: Secondary | ICD-10-CM | POA: Diagnosis not present

## 2015-01-05 DIAGNOSIS — M4722 Other spondylosis with radiculopathy, cervical region: Secondary | ICD-10-CM | POA: Diagnosis not present

## 2015-01-11 DIAGNOSIS — I2510 Atherosclerotic heart disease of native coronary artery without angina pectoris: Secondary | ICD-10-CM | POA: Diagnosis not present

## 2015-01-11 DIAGNOSIS — E039 Hypothyroidism, unspecified: Secondary | ICD-10-CM | POA: Diagnosis not present

## 2015-01-11 DIAGNOSIS — E785 Hyperlipidemia, unspecified: Secondary | ICD-10-CM | POA: Diagnosis not present

## 2015-01-11 DIAGNOSIS — R7301 Impaired fasting glucose: Secondary | ICD-10-CM | POA: Diagnosis not present

## 2015-01-17 ENCOUNTER — Encounter (HOSPITAL_COMMUNITY): Payer: Medicare Other

## 2015-01-19 ENCOUNTER — Encounter: Payer: Self-pay | Admitting: Critical Care Medicine

## 2015-01-19 ENCOUNTER — Ambulatory Visit (INDEPENDENT_AMBULATORY_CARE_PROVIDER_SITE_OTHER): Payer: Medicare Other | Admitting: Critical Care Medicine

## 2015-01-19 VITALS — BP 136/82 | HR 76 | Temp 98.1°F | Ht 61.0 in | Wt 154.6 lb

## 2015-01-19 DIAGNOSIS — L570 Actinic keratosis: Secondary | ICD-10-CM | POA: Diagnosis not present

## 2015-01-19 DIAGNOSIS — Z08 Encounter for follow-up examination after completed treatment for malignant neoplasm: Secondary | ICD-10-CM | POA: Diagnosis not present

## 2015-01-19 DIAGNOSIS — Z85828 Personal history of other malignant neoplasm of skin: Secondary | ICD-10-CM | POA: Diagnosis not present

## 2015-01-19 DIAGNOSIS — J4540 Moderate persistent asthma, uncomplicated: Secondary | ICD-10-CM

## 2015-01-19 NOTE — Progress Notes (Signed)
Subjective:    Patient ID: Latoya [name], female    DOB: 1946-10-22, 69 y.o.   MRN: [id number]  HPI   01/19/2015 Chief Complaint  Patient presents with  . Follow-up    Breathing doing well overall - wheezing last wk.  No SOB, chest tightness/CP or cough at this time.  Pt had neck surgery repeated C 4/5 and C 5/6  Put a plate in place .  No wound issues.  Pt had some blistering rash on the neck.  Pt did rx ABX if infected.   Pt is allergic to betadiene.  Noted some allergy induced wheezing.  No cough , stable dyspnea.   Pt denies any significant sore throat, nasal congestion or excess secretions, fever, chills, sweats, unintended weight loss, pleurtic or exertional chest pain, orthopnea PND, or leg swelling Pt denies any increase in rescue therapy over baseline, denies waking up needing it or having any early am or nocturnal exacerbations of coughing/wheezing/or dyspnea. Pt also denies any obvious fluctuation in symptoms with  weather or environmental change or other alleviating or aggravating factors  Pt did go to urgent care 12/30/14 for ?? PNA.  No dx of pna. Pt had sweats and more dyspnea. Rx levaquin and got better.  Pt did have low grade temp.   Review of Systems Constitutional:   No  weight loss, night sweats,  Fevers, chills, fatigue, lassitude. HEENT:   No headaches,  Difficulty swallowing,  Tooth/dental problems,  Sore throat,                No sneezing, itching, ear ache, nasal congestion, post nasal drip,   CV:  No chest pain,  Orthopnea, PND, swelling in lower extremities, anasarca, dizziness, palpitations  GI  No heartburn, indigestion, abdominal pain, nausea, vomiting, diarrhea, change in bowel habits, loss of appetite  Resp: Notes  shortness of breath with exertion not  at rest.  No excess mucus, no productive cough,  No non-productive cough,  No coughing up of blood.  No change in color of mucus.  No wheezing.  No chest wall deformity  Skin: no rash or lesions.  GU:  no dysuria, change in color of urine, no urgency or frequency.  No flank pain.  MS:  No joint pain or swelling.  No decreased range of motion.  No back pain.  Psych:  No change in mood or affect. No depression or anxiety.  No memory loss.     Objective:   Physical Exam Filed Vitals:   01/19/15 1136  BP: 136/82  Pulse: 76  Temp: 98.1 F (36.7 C)  TempSrc: Oral  Height: 5\' 1"  (1.549 m)  Weight: 154 lb 9.6 oz (70.126 kg)  SpO2: 99%    Gen: Pleasant, well-nourished, in no distress,  normal affect  ENT: No lesions,  mouth clear,  oropharynx clear, no postnasal drip  Neck: No JVD, no TMG, no carotid bruits  Lungs: No use of accessory muscles, no dullness to percussion, clear without rales or rhonchi  Cardiovascular: RRR, heart sounds normal, no murmur or gallops, no peripheral edema  Abdomen: soft and NT, no HSM,  BS normal  Musculoskeletal: No deformities, no cyanosis or clubbing, kyphotic spinal deformity  Neuro: alert, non focal  Skin: Warm, no lesions or rashes  No results found.     Assessment & Plan:   Asthma Stable mod persistent asthma Plan No change in inhaled or maintenance medications. Return in  6 months     Updated Medication List  Outpatient Encounter Prescriptions as of 01/19/2015  Medication Sig  . albuterol (PROVENTIL HFA;VENTOLIN HFA) 108 (90 BASE) MCG/ACT inhaler Inhale 2 puffs into the lungs every 6 (six) hours as needed for wheezing or shortness of breath.   Marland Kitchen aspirin 81 MG EC tablet Take 81 mg by mouth daily.   Marland Kitchen atorvastatin (LIPITOR) 20 MG tablet Take 1 tablet (20 mg total) by mouth daily.  Marland Kitchen azelastine (ASTELIN) 137 MCG/SPRAY nasal spray Place 1 spray into the nose 3 (three) times daily as needed. Use in each nostril as directed For allergies  . beclomethasone (QVAR) 80 MCG/ACT inhaler Inhale 2 puffs into the lungs 2 (two) times daily.  . Calcium Carbonate-Vitamin D 600-400 MG-UNIT per tablet Take 1 tablet by mouth 2 (two) times daily.      . cyclobenzaprine (FLEXERIL) 10 MG tablet Take 10 mg by mouth 3 (three) times daily as needed. For severe muscle pain  . diazepam (VALIUM) 5 MG tablet Take 5 mg by mouth at bedtime as needed for anxiety or sedation.   . docusate sodium (COLACE) 100 MG capsule Take 200 mg by mouth 2 (two) times daily.   Marland Kitchen EPINEPHrine (EPIPEN) 0.3 mg/0.3 mL DEVI Inject 0.3 mLs (0.3 mg total) into the muscle once. (Patient taking differently: Inject 0.3 mg into the muscle once as needed. )  . esomeprazole (NEXIUM) 40 MG capsule Take 40 mg by mouth 2 (two) times daily.   . fentaNYL (DURAGESIC - DOSED MCG/HR) 50 MCG/HR Place 1 patch onto the skin every other day.  . gabapentin (NEURONTIN) 600 MG tablet Take 600 mg by mouth at bedtime as needed.   Marland Kitchen HYDROmorphone (DILAUDID) 4 MG tablet Take 4-8 mg by mouth every 4 (four) hours as needed. For pain  . levothyroxine (SYNTHROID, LEVOTHROID) 75 MCG tablet Take 75 mcg by mouth daily before breakfast.   . lidocaine (LIDODERM) 5 % Place 1 patch onto the skin as needed (pain). Remove & Discard patch within 12 hours or as directed by MD  . Loratadine-Pseudoephedrine (CLARITIN-D 12 HOUR PO) Take 1 tablet by mouth 2 (two) times daily.   . metaxalone (SKELAXIN) 800 MG tablet Take 800 mg by mouth every 8 (eight) hours as needed. For muscle pain  . metoprolol (LOPRESSOR) 50 MG tablet Take 1 tablet (50 mg total) by mouth 2 (two) times daily.  . Misc Natural Products (COSAMIN ASU ADVANCED FORMULA PO) Take 2 capsules by mouth 2 (two) times daily.  . montelukast (SINGULAIR) 10 MG tablet Take 10 mg by mouth at bedtime.   . Multiple Vitamin (MULTIVITAMIN WITH MINERALS) TABS Take 1 tablet by mouth daily.  . nitroGLYCERIN (NITROSTAT) 0.4 MG SL tablet Place 1 tablet (0.4 mg total) under the tongue every 5 (five) minutes as needed. For chest pain  . nystatin (MYCOSTATIN) powder Apply topically 3 (three) times daily.  Marland Kitchen Respiratory Therapy Supplies (FLUTTER) DEVI Use twice daily as needed for  respiratory.  . triamcinolone (NASACORT) 55 MCG/ACT nasal inhaler Place 1-2 sprays into the nose 2 (two) times daily.   . [DISCONTINUED] fentaNYL (DURAGESIC - DOSED MCG/HR) 75 MCG/HR Place 75 mcg onto the skin See admin instructions. Change every 2 days.  . [DISCONTINUED] Glucosamine HCl (GLUCOSAMINE PO) Take 2 tablets by mouth 2 (two) times daily. Glucosamine ASU  . [DISCONTINUED] levofloxacin (LEVAQUIN) 500 MG tablet Take 1 tablet (500 mg total) by mouth daily. (Patient not taking: Reported on 01/19/2015)

## 2015-01-19 NOTE — Patient Instructions (Signed)
No change in medications. Return in        6 months        

## 2015-01-20 NOTE — Assessment & Plan Note (Signed)
Stable mod persistent asthma Plan No change in inhaled or maintenance medications. Return in  6 months

## 2015-01-21 ENCOUNTER — Ambulatory Visit (HOSPITAL_COMMUNITY): Payer: Medicare Other | Attending: Cardiology | Admitting: Cardiology

## 2015-01-21 DIAGNOSIS — I6523 Occlusion and stenosis of bilateral carotid arteries: Secondary | ICD-10-CM | POA: Diagnosis not present

## 2015-01-21 NOTE — Progress Notes (Signed)
Carotid duplex performed 

## 2015-01-24 DIAGNOSIS — Z1231 Encounter for screening mammogram for malignant neoplasm of breast: Secondary | ICD-10-CM | POA: Diagnosis not present

## 2015-01-25 ENCOUNTER — Telehealth: Payer: Self-pay | Admitting: Cardiology

## 2015-01-25 NOTE — Telephone Encounter (Signed)
This is Dr Latoya [name] patient.  I spoke with her about her recent  carotid doppler results.

## 2015-01-25 NOTE — Telephone Encounter (Signed)
New message    Patient is returning nurses call. Please call back

## 2015-02-02 DIAGNOSIS — M960 Pseudarthrosis after fusion or arthrodesis: Secondary | ICD-10-CM | POA: Diagnosis not present

## 2015-02-02 DIAGNOSIS — G894 Chronic pain syndrome: Secondary | ICD-10-CM | POA: Diagnosis not present

## 2015-02-02 DIAGNOSIS — M963 Postlaminectomy kyphosis: Secondary | ICD-10-CM | POA: Diagnosis not present

## 2015-02-02 DIAGNOSIS — M4722 Other spondylosis with radiculopathy, cervical region: Secondary | ICD-10-CM | POA: Diagnosis not present

## 2015-02-09 ENCOUNTER — Ambulatory Visit: Payer: Medicare Other | Admitting: Podiatrist

## 2015-02-18 ENCOUNTER — Other Ambulatory Visit: Payer: Self-pay

## 2015-02-21 ENCOUNTER — Other Ambulatory Visit: Payer: Self-pay

## 2015-02-21 MED ORDER — NITROGLYCERIN 0.4 MG SL SUBL: 0.4000 mg | SUBLINGUAL_TABLET | SUBLINGUAL | Status: DC | PRN

## 2015-03-02 ENCOUNTER — Ambulatory Visit: Payer: Medicare Other | Admitting: Podiatrist

## 2015-03-02 DIAGNOSIS — I10 Essential (primary) hypertension: Secondary | ICD-10-CM | POA: Diagnosis not present

## 2015-03-02 DIAGNOSIS — M5412 Radiculopathy, cervical region: Secondary | ICD-10-CM | POA: Diagnosis not present

## 2015-03-02 DIAGNOSIS — D6861 Antiphospholipid syndrome: Secondary | ICD-10-CM | POA: Diagnosis not present

## 2015-03-02 DIAGNOSIS — M542 Cervicalgia: Secondary | ICD-10-CM | POA: Diagnosis not present

## 2015-03-09 ENCOUNTER — Ambulatory Visit: Payer: Medicare Other | Admitting: Podiatrist

## 2015-03-30 DIAGNOSIS — G894 Chronic pain syndrome: Secondary | ICD-10-CM | POA: Diagnosis not present

## 2015-03-30 DIAGNOSIS — M4722 Other spondylosis with radiculopathy, cervical region: Secondary | ICD-10-CM | POA: Diagnosis not present

## 2015-03-30 DIAGNOSIS — M960 Pseudarthrosis after fusion or arthrodesis: Secondary | ICD-10-CM | POA: Diagnosis not present

## 2015-03-30 DIAGNOSIS — M963 Postlaminectomy kyphosis: Secondary | ICD-10-CM | POA: Diagnosis not present

## 2015-05-19 DIAGNOSIS — H43813 Vitreous degeneration, bilateral: Secondary | ICD-10-CM | POA: Diagnosis not present

## 2015-05-27 DIAGNOSIS — M963 Postlaminectomy kyphosis: Secondary | ICD-10-CM | POA: Diagnosis not present

## 2015-05-27 DIAGNOSIS — G894 Chronic pain syndrome: Secondary | ICD-10-CM | POA: Diagnosis not present

## 2015-05-27 DIAGNOSIS — M4722 Other spondylosis with radiculopathy, cervical region: Secondary | ICD-10-CM | POA: Diagnosis not present

## 2015-05-27 DIAGNOSIS — M960 Pseudarthrosis after fusion or arthrodesis: Secondary | ICD-10-CM | POA: Diagnosis not present

## 2015-06-03 ENCOUNTER — Telehealth: Payer: Self-pay | Admitting: Internal Medicine

## 2015-06-03 NOTE — Telephone Encounter (Signed)
Patient says that she has been using the same flutter device for a while now.  She says that she cleans it out with Dawn dishwashing detergent and rinses it very good, she says that now when she uses it, it has a taste of Dawn dishwashing detergent.  She wants to know if Dr. Melvyn Novas will give her another flutter device.

## 2015-06-03 NOTE — Telephone Encounter (Signed)
Sure that's fine but good hot water rinse should get rid of any residual soap

## 2015-06-06 ENCOUNTER — Telehealth: Payer: Self-pay | Admitting: Internal Medicine

## 2015-06-06 MED ORDER — FLUTTER DEVI: Status: DC

## 2015-06-06 NOTE — Telephone Encounter (Signed)
Left message with spouse to have patient call back.

## 2015-06-06 NOTE — Telephone Encounter (Signed)
Called spoke with pt. She reports she is not going to pick up the flutter valve. She needed nothing further

## 2015-06-06 NOTE — Telephone Encounter (Signed)
[phone] or [phone], pt cb

## 2015-06-06 NOTE — Telephone Encounter (Signed)
Called spoke with pt and is aware of below. Flutter left for pick up along with form attached that she needs to sign then return to nurse. Nothing further needed

## 2015-06-30 ENCOUNTER — Telehealth: Payer: Self-pay | Admitting: Internal Medicine

## 2015-06-30 NOTE — Telephone Encounter (Signed)
Appointment was needed. Nothing further needed at this time.

## 2015-07-25 ENCOUNTER — Ambulatory Visit: Payer: No Typology Code available for payment source | Admitting: Internal Medicine

## 2015-07-26 ENCOUNTER — Encounter: Payer: Self-pay | Admitting: Internal Medicine

## 2015-07-26 ENCOUNTER — Ambulatory Visit (INDEPENDENT_AMBULATORY_CARE_PROVIDER_SITE_OTHER): Payer: Medicare Other | Admitting: Internal Medicine

## 2015-07-26 VITALS — BP 130/84 | HR 75 | Wt 109.0 lb

## 2015-07-26 DIAGNOSIS — J4530 Mild persistent asthma, uncomplicated: Secondary | ICD-10-CM | POA: Diagnosis not present

## 2015-07-26 DIAGNOSIS — I6523 Occlusion and stenosis of bilateral carotid arteries: Secondary | ICD-10-CM | POA: Diagnosis not present

## 2015-07-26 NOTE — Progress Notes (Signed)
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 Follow-up    Breathing doing well overall - wheezing last wk.  No SOB, chest tightness/CP or cough at this time.  Pt had neck surgery repeated C 4/5 and C 5/6  Put a plate in place .  No wound issues.  Pt had some blistering rash on the neck.  Pt did rx ABX if infected.   Pt is allergic to betadiene.  Noted some allergy induced wheezing.  Pt did go to urgent care 12/30/14 for ?? PNA.  No dx of pna. Pt had sweats and more dyspnea. Rx levaquin and got better.  Pt did have low grade temp.  rec No change rx   07/26/2015  f/u ov/Wert re: ? Cough variant asthma on qvar 80 one bid  Chief Complaint  Patient presents with  . Follow-up    Former patient of Dr Joya Gaskins. Pt states overall her breathing is doing well.  She states she has not had to use albuterol inhaler since the Spring.   sleeps at 30 degrees ok s excess am mucus  Swims x up to 66min 2-3 x a week and sings in choir  Less trouble with swallowing now seems to correlate with better airway symptoms  No obvious day to day or daytime variability or assoc chronic cough or cp or chest tightness, subjective wheeze or overt sinus or hb symptoms. No unusual exp hx or h/o childhood pna/ asthma or knowledge of premature birth.  Sleeping ok without nocturnal  or early am exacerbation  of respiratory  c/o's or need for noct saba. Also denies any obvious fluctuation of symptoms with weather or environmental changes or other aggravating or alleviating factors except as outlined above   Current Medications, Allergies, Complete Past Medical History, Past Surgical History, Family History, and Social History were reviewed in [facility] [employer] [facility] [employer] record.  ROS  The following are not active complaints unless bolded sore throat, dysphagia, dental problems, itching, sneezing,  nasal congestion or excess/ purulent secretions, ear ache,   fever, chills, sweats, unintended wt loss, classically pleuritic or exertional cp, hemoptysis,  orthopnea pnd or leg swelling, presyncope, palpitations, abdominal pain, anorexia, nausea, vomiting, diarrhea  or change in bowel or bladder habits, change in stools or urine, dysuria,hematuria,  rash, arthralgias, visual complaints, headache, numbness, weakness or ataxia or problems with walking or coordination,  change in mood/affect or memory.                         Objective:   Physical Exam   amb wf using rolling walker   Wt Readings from Last 3 Encounters:  07/26/15 109 lb (49.442 kg)  01/19/15 154 lb 9.6 oz (70.126 kg)  12/30/14 155 lb 9.6 oz (70.58 kg)    Vital signs reviewed    Gen: Pleasant, well-nourished, in no distress,  normal affect  ENT: No lesions,  mouth clear,  oropharynx clear, no postnasal drip  Neck: No JVD, no TMG, no carotid bruits  Lungs: No use of accessory muscles, slt decrease bs R base o/w clear to A and P   Cardiovascular: RRR, heart sounds normal,  no murmur or gallops, no peripheral edema  Abdomen: soft and NT, no HSM,  BS normal  Musculoskeletal: No deformities, no cyanosis or clubbing, kyphotic spinal deformity  Neuro: alert, non focal  Skin: Warm, no lesions or rashes     I personally reviewed images and agree with radiology impression as follows:  CXR:  12/30/14 Normal heart size and pulmonary vascularity. Elevation of the right hemidiaphragm with linear scarring in the right lung base. This is unchanged since previous study. No developing airspace disease or consolidation in the lungs. No blunting of costophrenic angles. No pneumothorax. Postoperative changes in the cervical and in the thoracolumbar spine.      Assessment & Plan:

## 2015-07-26 NOTE — Patient Instructions (Signed)
Flu shot today, you do not need to ever repeat the prevnar   Ok to return in one year - call sooner if breathing worse or need for rescue inhaler

## 2015-07-27 ENCOUNTER — Encounter: Payer: Self-pay | Admitting: Internal Medicine

## 2015-07-27 NOTE — Assessment & Plan Note (Addendum)
It is not entirely clear how many of her symptoms are actually asthma versus instability the upper airway related to neck surgery and dysphagia which are much better and if this asthma :  On Qvar 80 one bid :  All goals of chronic asthma control met including optimal function and elimination of symptoms with minimal need for rescue therapy.  Contingencies discussed in full including contacting this office immediately if not controlling the symptoms using the rule of two's.     I had an extended discussion with the patient reviewing all relevant studies completed to date and  lasting 15 to 20 minutes of a 25 minute visit    Each maintenance medication was reviewed in detail including most importantly the difference between maintenance and prns and under what circumstances the prns are to be triggered using an action plan format that is not reflected in the computer generated alphabetically organized AVS.    Please see instructions for details which were reviewed in writing and the patient given a copy highlighting the part that I personally wrote and discussed at today's ov.

## 2015-08-01 DIAGNOSIS — M963 Postlaminectomy kyphosis: Secondary | ICD-10-CM | POA: Diagnosis not present

## 2015-08-01 DIAGNOSIS — M960 Pseudarthrosis after fusion or arthrodesis: Secondary | ICD-10-CM | POA: Diagnosis not present

## 2015-08-01 DIAGNOSIS — M4722 Other spondylosis with radiculopathy, cervical region: Secondary | ICD-10-CM | POA: Diagnosis not present

## 2015-08-01 DIAGNOSIS — G894 Chronic pain syndrome: Secondary | ICD-10-CM | POA: Diagnosis not present

## 2015-08-03 DIAGNOSIS — J4530 Mild persistent asthma, uncomplicated: Secondary | ICD-10-CM | POA: Diagnosis not present

## 2015-08-03 DIAGNOSIS — J309 Allergic rhinitis, unspecified: Secondary | ICD-10-CM | POA: Diagnosis not present

## 2015-08-03 DIAGNOSIS — Z91030 Bee allergy status: Secondary | ICD-10-CM | POA: Diagnosis not present

## 2015-09-07 DIAGNOSIS — L821 Other seborrheic keratosis: Secondary | ICD-10-CM | POA: Diagnosis not present

## 2015-09-07 DIAGNOSIS — Z85828 Personal history of other malignant neoplasm of skin: Secondary | ICD-10-CM | POA: Diagnosis not present

## 2015-09-07 DIAGNOSIS — L853 Xerosis cutis: Secondary | ICD-10-CM | POA: Diagnosis not present

## 2015-10-03 DIAGNOSIS — M4722 Other spondylosis with radiculopathy, cervical region: Secondary | ICD-10-CM | POA: Diagnosis not present

## 2015-10-03 DIAGNOSIS — G894 Chronic pain syndrome: Secondary | ICD-10-CM | POA: Diagnosis not present

## 2015-10-03 DIAGNOSIS — M963 Postlaminectomy kyphosis: Secondary | ICD-10-CM | POA: Diagnosis not present

## 2015-10-03 DIAGNOSIS — M960 Pseudarthrosis after fusion or arthrodesis: Secondary | ICD-10-CM | POA: Diagnosis not present

## 2015-10-29 ENCOUNTER — Other Ambulatory Visit: Payer: Self-pay | Admitting: Cardiovascular Disease

## 2015-11-08 ENCOUNTER — Ambulatory Visit (INDEPENDENT_AMBULATORY_CARE_PROVIDER_SITE_OTHER): Payer: No Typology Code available for payment source | Admitting: Cardiology

## 2015-11-08 DIAGNOSIS — I480 Paroxysmal atrial fibrillation: Secondary | ICD-10-CM

## 2015-11-08 NOTE — Patient Instructions (Signed)
Medication Instructions:     If you need a refill on your cardiac medications before your next appointment, please call your pharmacy.  Labwork:   Testing/Procedures:   Follow-Up:   Any Other Special Instructions Will Be Listed Below (If Applicable).                                                                                                                                                   

## 2015-11-09 ENCOUNTER — Encounter: Payer: Self-pay | Admitting: Cardiology

## 2015-11-09 ENCOUNTER — Encounter: Payer: Self-pay | Admitting: Nurse Practitioner

## 2015-11-09 ENCOUNTER — Ambulatory Visit (INDEPENDENT_AMBULATORY_CARE_PROVIDER_SITE_OTHER): Payer: Medicare Other | Admitting: Nurse Practitioner

## 2015-11-09 VITALS — BP 140/80 | HR 70 | Ht 61.0 in | Wt 154.1 lb

## 2015-11-09 DIAGNOSIS — I10 Essential (primary) hypertension: Secondary | ICD-10-CM | POA: Diagnosis not present

## 2015-11-09 DIAGNOSIS — E785 Hyperlipidemia, unspecified: Secondary | ICD-10-CM

## 2015-11-09 DIAGNOSIS — I480 Paroxysmal atrial fibrillation: Secondary | ICD-10-CM | POA: Diagnosis not present

## 2015-11-09 DIAGNOSIS — I2510 Atherosclerotic heart disease of native coronary artery without angina pectoris: Secondary | ICD-10-CM

## 2015-11-09 MED ORDER — ATORVASTATIN CALCIUM 20 MG PO TABS: 20.0000 mg | ORAL_TABLET | Freq: Every day | ORAL | Status: DC

## 2015-11-09 MED ORDER — METOPROLOL TARTRATE 50 MG PO TABS
50.0000 mg | ORAL_TABLET | Freq: Two times a day (BID) | ORAL | Status: DC
Start: 2015-11-09 — End: 2017-01-05

## 2015-11-09 NOTE — Progress Notes (Signed)
CARDIOLOGY OFFICE NOTE  Date:  11/09/2015    Latoya [name] [facility] Date of Birth: [birth year] Medical Record [id number]  PCP:  Thressa Sheller, MD  Cardiologist:  Angelena Form    Chief Complaint  Patient presents with  . Coronary Artery Disease  . Atrial Fibrillation  . Hypertension    1 year check - seen for Dr. Angelena Form    History of Present Illness: Latoya [name] is a 70 y.o. female who presents today for a follow up visit. This is a one year check. Seen for Dr. Angelena Form.  She has a history of spina bifida, HTN, hyperlipidemia, anti-phospholipid antibody syndrome, GERD, asthma, CAD s/p cath 2003 with 30% ostial LM stenosis, 50% mid LAD, 80% moderate sized Diagonal, 80% Circumflex. There was discussion about bypass at that time but medical management was pursued and she has done well with this plan over the years. She also has chronic back pain and paroxysmal atrial fibrillation. She has been previously followed by Dr. Johnsie Cancel in our office. She has a hypercoagulable state but has not been managed on coumadin because of prior intolerance. She has allergies to heparin (anaphylaxis) and Lovenox. Her coumadin had been stopped in the past because of trouble regulating her dose. She has been seen by a hematologist at [place] (Dr. Donney Dice) and he has told her that she does not need coumadin for her hypercoagulable state. We discussed Xarelto or Eliquis for PAF but she did not wish to start. She had been on amiodarone in the past but this was stopped in August 2010 by Dr. Johnsie Cancel. She has been diagnosed with hypothyroidism and has been on Synthroid. She has had no lower extremity edema but she does have chronic problems with pain in her feet from Raynaud's phenomenon and ongoing sciatica. Most recent stress test October 2013 with no ischemia, normal LV function. Last echo June 2014 with normal LV size, mild LVH, normal LV systolic function with EF of 55%, mild MR with mildly thickened mitral valve  leaflets. Carotid artery dopplers 12/16/13 with stable 40-59% bilateral stenosis. She had several bouts with pneumonia Fall of 2015. Underwent cervical spine surgery November 2015.   She was last seen in December of 2015 and was felt to be doing well. Discussion regarding starting Eliquis/Xarelto - she was going to consider and call back if agreeable - she did not.   She was to have been seen yesterday - did not show - showed up today - thus added to my schedule.   Comes in today. Here alone. Using a walker. She says she is doing ok. She is here because she needs refills for her Lopressor and Lipitor. No chest pain. No NTG use. Rare palpitations - usually correlates with caffeine use. No atrial fib that she is aware of. Breathing is ok. She does not remember Dr. Angelena Form talking about Eliquis/Xarelto. No active bleeding. We do not have recent labs. She is due for her physical with PCP next month. Not checking her BP at home but planning on getting a new cuff.   Past Medical History  Diagnosis Date  . Asthma     extrinsic; moderate, persistant, nml spirometry 2010, nml CXR 1/08  . Pulmonary embolism ([place])     related to back surgery with prior coumadin use, now off  . DVT (deep venous thrombosis) ([place])   . PAF (paroxysmal atrial fibrillation) ([facility])     not on coumadin therapy  . HLD (hyperlipidemia)   . CAD (coronary artery disease)  50% mid LAD, 80% ostial D1 and moderate 80% mid circ by vath 2003  . Antiphospholipid syndrome ([facility])     with hypercoagulable state  . Coronary heart disease   . Drug allergy     heparin/lovenox  . Erosive gastritis 1994  . Anemia, iron deficiency   . Hypothyroidism   . Bladder troubles     REPORTS INFECTIONS ON OCCASION DUE TO URETHRA MEATUS STRICTURE AT BIRTH   . H/O hiatal hernia   . Spondylosis, lumbosacral     ARTHRITIS- OA   . Cancer ([place])     basal cell removed fr. L arm   . Liver spot     cyst- - no biopsy, but told that its benign   . GERD  (gastroesophageal reflux disease)   . Complication of anesthesia     cardiac arrest- in OR, at age 71 (48)y.o. during Scalenotomy in her early 20's  . Family history of adverse reaction to anesthesia     some family members have had trouble waking up  . HTN (hypertension)     [name], manages pt.  LOV 03/2014  . Blood dyscrasia     antiphospholipid disorder    Past Surgical History  Procedure Laterality Date  . Total knee arthroplasty      left  . Cleft lip and palate repair      70 yo   . Hysterectomy    . Spina bifida repair      70 yo   . Cardiac catheterization      2005  . Joint replacement    . Tonsillectomy    . Breast surgery      all benign cysts x3   . Abdominal hysterectomy      partial abdominal -1998  . Back surgery      x13 cervical and lumbar thoracic spine surgery  . Breast enhancement surgery    . Posterior cervical fusion/foraminotomy Right 08/31/2014    Procedure: Right cervical six-seven foraminotomy, Cervical five-Thoracic one fusion, Cervical six-seven lateral mass screws;  Surgeon: Erline Levine, MD;  Location: [facility];  Service: Neurosurgery;  Laterality: Right;     Medications: Current Outpatient Prescriptions  Medication Sig Dispense Refill  . albuterol (PROVENTIL HFA;VENTOLIN HFA) 108 (90 BASE) MCG/ACT inhaler Inhale 2 puffs into the lungs every 6 (six) hours as needed for wheezing or shortness of breath.     Marland Kitchen aspirin 81 MG EC tablet Take 81 mg by mouth daily.     Marland Kitchen atorvastatin (LIPITOR) 20 MG tablet Take 1 tablet (20 mg total) by mouth daily. 90 tablet 3  . azelastine (ASTELIN) 137 MCG/SPRAY nasal spray Place 1 spray into the nose 3 (three) times daily as needed. Use in each nostril as directed For allergies    . beclomethasone (QVAR) 80 MCG/ACT inhaler Inhale 2 puffs into the lungs 2 (two) times daily. 1 Inhaler 11  . Calcium Carbonate-Vitamin D 600-400 MG-UNIT per tablet Take 1 tablet by mouth 2 (two) times daily.      .  cyclobenzaprine (FLEXERIL) 10 MG tablet Take 10 mg by mouth 3 (three) times daily as needed. For severe muscle pain    . diazepam (VALIUM) 5 MG tablet Take 5 mg by mouth at bedtime as needed for anxiety or sedation.     . docusate sodium (COLACE) 100 MG capsule Take 200 mg by mouth 2 (two) times daily.     Marland Kitchen EPINEPHrine (EPIPEN) 0.3 mg/0.3 mL DEVI Inject 0.3 mLs (0.3  mg total) into the muscle once. (Patient taking differently: Inject 0.3 mg into the muscle once as needed. ) 1 Device 2  . esomeprazole (NEXIUM) 40 MG capsule Take 40 mg by mouth 2 (two) times daily.     . fentaNYL (DURAGESIC - DOSED MCG/HR) 50 MCG/HR Place 1 patch onto the skin every 3 (three) days.     Marland Kitchen gabapentin (NEURONTIN) 600 MG tablet Take 600 mg by mouth at bedtime as needed.     Marland Kitchen glucosamine-chondroitin 500-400 MG tablet Take 2 tablets by mouth 2 (two) times daily.    Marland Kitchen HYDROmorphone (DILAUDID) 4 MG tablet Take 4-8 mg by mouth every 4 (four) hours as needed. For pain    . levothyroxine (SYNTHROID, LEVOTHROID) 75 MCG tablet Take 75 mcg by mouth daily before breakfast.     . lidocaine (LIDODERM) 5 % Place 1 patch onto the skin as needed (pain). Remove & Discard patch within 12 hours or as directed by MD    . Loratadine-Pseudoephedrine (CLARITIN-D 12 HOUR PO) Take 1 tablet by mouth 2 (two) times daily.     . Melatonin 2.5 MG CAPS Take 2.5-5 mg by mouth at bedtime.    . metaxalone (SKELAXIN) 800 MG tablet Take 800 mg by mouth every 8 (eight) hours as needed. For muscle pain    . metoprolol (LOPRESSOR) 50 MG tablet Take 1 tablet (50 mg total) by mouth 2 (two) times daily. 180 tablet 3  . montelukast (SINGULAIR) 10 MG tablet Take 10 mg by mouth at bedtime.     . Multiple Vitamin (MULTIVITAMIN WITH MINERALS) TABS Take 1 tablet by mouth daily.    . nitroGLYCERIN (NITROSTAT) 0.4 MG SL tablet Place 1 tablet (0.4 mg total) under the tongue every 5 (five) minutes as needed. For chest pain 25 tablet 3  . nystatin (MYCOSTATIN) powder  Apply topically 3 (three) times daily.    Marland Kitchen Respiratory Therapy Supplies (FLUTTER) DEVI Use as directed 1 each 0  . triamcinolone (NASACORT) 55 MCG/ACT nasal inhaler Place 1-2 sprays into the nose 2 (two) times daily.      No current facility-administered medications for this visit.    Allergies: Allergies  Allergen Reactions  . Hornet Venom Anaphylaxis  . Pork-Derived Products Anaphylaxis  . Chlorhexidine Itching    Reports that it is the dye in it  . Enoxaparin Sodium     REACTION: thrombocytopenia  . Heparin     REACTION: thrombocytopenia  . Lactose Intolerance (Gi)   . Other     Sensitive to dye in Betadine & Chlorohexadine   . Povidone-Iodine     Sensitivity- but if its wiped off she is able to tolerate betadine   . Indomethacin Rash  . Phenylpropanolamine Rash    Other Reaction: other reaction  . Povidone Iodine Rash    Social History: The patient  reports that she has never smoked. She has never used smokeless tobacco. She reports that she does not drink alcohol or use illicit drugs.   Family History: The patient's family history includes Heart disease in her father and sister.   Review of Systems: Please see the history of present illness.   Otherwise, the review of systems is positive for none.   All other systems are reviewed and negative.   Physical Exam: VS:  BP 140/80 mmHg  Pulse 70  Ht 5\' 1"  (1.549 m)  Wt 154 lb 1.9 oz (69.908 kg)  BMI 29.14 kg/m2 .  BMI Body mass index is 29.14 kg/(m^2).  Wt Readings from Last 3 Encounters:  11/09/15 154 lb 1.9 oz (69.908 kg)  07/26/15 109 lb (49.442 kg)  01/19/15 154 lb 9.6 oz (70.126 kg)    General: Pleasant. Chronically ill appearing. Very kyphotic. Using a walker. She is alert and in no acute distress. She seems to have flight of ideas.  HEENT: Normal. Prior cleft repair.  Neck: Supple, no JVD, carotid bruits, or masses noted.  Cardiac: Regular rate and rhythm. No murmurs, rubs, or gallops. No edema.    Respiratory:  Lungs are clear to auscultation bilaterally with normal work of breathing.  GI: Soft and nontender.  MS: No deformity or atrophy. Gait and ROM intact. Skin: Warm and dry. Color is normal.  Neuro:  Strength and sensation are intact and no gross focal deficits noted.  Psych: Alert, appropriate and with normal affect.   LABORATORY DATA:  EKG:  EKG is ordered today. This demonstrates NSR.  Lab Results  Component Value Date   WBC 6.5 12/30/2014   HGB 13.8 12/30/2014   HCT 44.1 12/30/2014   PLT 231 08/23/2014   GLUCOSE 107* 08/23/2014   CHOL 171 08/05/2009   TRIG 259.0* 08/05/2009   HDL 38.20* 08/05/2009   LDLDIRECT 98.6 08/05/2009   ALT 17 07/22/2014   AST 38* 07/22/2014   NA 138 08/23/2014   K 4.6 08/23/2014   CL 95* 08/23/2014   CREATININE 0.53 08/23/2014   BUN 26* 08/23/2014   CO2 29 08/23/2014   TSH 1.959 02/12/2013   INR 0.88 02/12/2013    BNP (last 3 results) No results for input(s): BNP in the last 8760 hours.  ProBNP (last 3 results) No results for input(s): PROBNP in the last 8760 hours.   Other Studies Reviewed Today:  Echo 04/12/13: Left ventricle: The cavity size was normal. Systolic function was normal. The estimated ejection fraction was in the range of 50% to 55%.   ABI: Normal June 2014  Carotid Doppler 01/2015 Stable 40-59% bilateral ICA stenosis, over serial exams. Patent vertebral arteries with antegrade flow. Normal subclavian arteries, bilaterally. f/u 2 years  Stress myoview: 08/13/12: Impression  Exercise Capacity: Lexiscan with no exercise.  BP Response: Normal blood pressure response.  Clinical Symptoms: Headache requiring amynophylline  ECG Impression: No significant ST segment change suggestive of ischemia.  Comparison with Prior Nuclear Study: No images to compare  Overall Impression: Apical thinning no obvious ischemia Lateral wall not interpretable due to a large area of bowel artifact on both resting and  stress images  LV Ejection Fraction: 47%. LV Wall Motion: Apical hypokinesis  Cardiac cath 09/22/02: 1. Ventriculography was performed in the RAO projection. Overall ejection  fraction was 70%. No segmental wall motion abnormalities were  identified.  2. The left main coronary artery has, what appears to be, about 30%  narrowing at the ostium. In most views there does appear to be a patent  ostium; however, some tapered narrowing cannot be excluded.  3. The LAD proper has about 40-50% narrowing, at most, in the mid portion  beyond the diagonal takeoff. The vessel is calcified. The diagonal  takeoff itself has a long 80% stenosis at the ostium extending out into  the mid portion of the diagonal. The distal diagonal does appear to be  suitable for grafting.  4. The large circumflex has about 70-80% focal narrowing prior to the  bifurcation of the marginal.  5. The right coronary artery has some mild luminal irregularities but no  high-grade stenoses.  IMPRESSION:  1. Coronary  artery disease with significant involvement and circumflex  involvement.  2. Multiple medical issues, as described above.  EKG: NSR, rate 74 bpm. Non-specific ST abnormalities.   Assessment and Plan:   1. CAD: Stable. Stress test 2013 without ischemia. No chest pain. She is known to have moderate CAD. Continue current medical management. Lopressor refilled today.   2. PAROXYSMAL ATRIAL FIBRILLATION: Sinus today on EKG and by exam. Occasional palpitations. She has not been on coumadin because she refuses due to prior issues regulating levels. I have once again discussed one of the newer agents and she will consider. She will consider Xarelto or Eliquis in the future and will call if she decides to try this.   3. HYPERTENSION: BP a little elevated - she was pretty upset when she got here due to the mix up with her appointment. I have asked her to monitor at home.   4. CAROTID ARTERY DISEASE:  Stable by dopplers 3/16. No changes. Repeat in 12/2016  5. Hyperlipidemia: Continue statin. Lipids followed in primary care. Lipitor refilled today. We have called Dr. Alyson Ingles for last set of labs.          Current medicines are reviewed with the patient today.  The patient does not have concerns regarding medicines other than what has been noted above.  The following changes have been made:  See above.  Labs/ tests ordered today include:    Orders Placed This Encounter  Procedures  . EKG 12-Lead     Disposition:   FU with Dr. Angelena Form in 6 months.   Patient is agreeable to this plan and will call if any problems develop in the interim.   Signed: Burtis Junes, RN, ANP-C 11/09/2015 3:35 PM  [facility] [address] [place], [place]  [address] Phone: [phone] Fax: [phone]

## 2015-11-09 NOTE — Progress Notes (Signed)
Pt was a no show for apt.  Kerin Ransom PA-C 11/09/2015 7:47 AM

## 2015-11-09 NOTE — Patient Instructions (Addendum)
We will be checking the following labs today -  NONE  We will call Dr. Alyson Ingles for a copy of your labs.    Medication Instructions:    Continue with your current medicines.   I sent your refills in today.     Testing/Procedures To Be Arranged:  N/A  Follow-Up:   See Dr. Angelena Form in 6 months    Other Special Instructions:   Think about Eliquis or Xarelto as a blood thinner.     If you need a refill on your cardiac medications before your next appointment, please call your pharmacy.   Call the [place] office at [phone] if you have any questions, problems or concerns.

## 2015-11-09 NOTE — Progress Notes (Signed)
Pt no showed for apt  Kerin Ransom PA-C 11/09/2015 7:46 AM

## 2015-12-13 DIAGNOSIS — Z780 Asymptomatic menopausal state: Secondary | ICD-10-CM | POA: Diagnosis not present

## 2015-12-20 NOTE — Progress Notes (Signed)
Chief Complaint  Patient presents with  . Follow-up  . Coronary Artery Disease    History of Present Illness: 69 yo female with HTN, hyperlipidemia, anti-phospholipid antibody syndrome, GERD, asthma, CAD here today for cardiac follow up. Cardiac cath 2003 with 30% ostial LM stenosis, 50% mid LAD, 80% moderate sized Diagonal, 80% Circumflex. There was discussion about bypass at that time but medical management was pursued and she has done well with this plan over the years. She also has chronic back pain and paroxysmal atrial fibrillation. She has a hypercoagulable state but has not been managed on coumadin because of prior intolerance. She has allergies to heparin (anaphylaxis) and Lovenox. Her coumadin had been stopped in the past because of trouble regulating her dose. She is seen by a hematologist at [facility] and he has told her that she does not need coumadin for her hypercoagulable state. We discussed Xarelto or Eliquis for PAF but she did not wish to start. She had been on amiodarone in the past but this was stopped in August 2010 by Dr. Johnsie Cancel. She has been diagnosed with hypothyroidism and has been on Synthroid. She has had no lower extremity edema but she does have chronic problems with pain in her feet from Raynaud's phenomenon and ongoing sciatica. Most recent stress test October 2013 with no ischemia, normal LV function.  Last echo June 2014 with normal LV size, mild LVH, normal LV systolic function with EF of 55%, mild MR with mildly thickened mitral valve leaflets. Carotid artery dopplers April 2016 with stable 40-59% bilateral stenosis.   She tells me today that she has been feeling well. She has had no chest pain or SOB. No LE edema. She has rare palpitations. She is wanting to start Xarelto now.   Primary Care Physician: Trilby Drummer   Last Lipid Profile: Followed in primary care.  Past Medical History  Diagnosis Date  . Asthma     extrinsic; moderate, persistant, nml spirometry  2010, nml CXR 1/08  . Pulmonary embolism ([place])     related to back surgery with prior coumadin use, now off  . DVT (deep venous thrombosis) ([place])   . PAF (paroxysmal atrial fibrillation) ([facility])     not on coumadin therapy  . HLD (hyperlipidemia)   . CAD (coronary artery disease)     50% mid LAD, 80% ostial D1 and moderate 80% mid circ by vath 2003  . Antiphospholipid syndrome ([facility])     with hypercoagulable state  . Coronary heart disease   . Drug allergy     heparin/lovenox  . Erosive gastritis 1994  . Anemia, iron deficiency   . Hypothyroidism   . Bladder troubles     REPORTS INFECTIONS ON OCCASION DUE TO URETHRA MEATUS STRICTURE AT BIRTH   . H/O hiatal hernia   . Spondylosis, lumbosacral     ARTHRITIS- OA   . Cancer ([place])     basal cell removed fr. L arm   . Liver spot     cyst- - no biopsy, but told that its benign   . GERD (gastroesophageal reflux disease)   . Complication of anesthesia     cardiac arrest- in OR, at age 77 (42)y.o. during Scalenotomy in her early 20's  . Family history of adverse reaction to anesthesia     some family members have had trouble waking up  . HTN (hypertension)     [name] at [facility], manages pt.  LOV 03/2014  . Blood dyscrasia  antiphospholipid disorder    Past Surgical History  Procedure Laterality Date  . Total knee arthroplasty      left  . Cleft lip and palate repair      70 yo   . Hysterectomy    . Spina bifida repair      70 yo   . Cardiac catheterization      2005  . Joint replacement    . Tonsillectomy    . Breast surgery      all benign cysts x3   . Abdominal hysterectomy      partial abdominal -1998  . Back surgery      x13 cervical and lumbar thoracic spine surgery  . Breast enhancement surgery    . Posterior cervical fusion/foraminotomy Right 08/31/2014    Procedure: Right cervical six-seven foraminotomy, Cervical five-Thoracic one fusion, Cervical six-seven lateral mass screws;  Surgeon: Erline Levine, MD;   Location: [place] [facility];  Service: Neurosurgery;  Laterality: Right;    Current Outpatient Prescriptions  Medication Sig Dispense Refill  . albuterol (PROVENTIL HFA;VENTOLIN HFA) 108 (90 BASE) MCG/ACT inhaler Inhale 2 puffs into the lungs every 6 (six) hours as needed for wheezing or shortness of breath.     Marland Kitchen aspirin 81 MG EC tablet Take 81 mg by mouth daily.     Marland Kitchen atorvastatin (LIPITOR) 20 MG tablet Take 1 tablet (20 mg total) by mouth daily. 90 tablet 3  . azelastine (ASTELIN) 137 MCG/SPRAY nasal spray Place 1 spray into the nose 3 (three) times daily as needed. Use in each nostril as directed For allergies    . beclomethasone (QVAR) 80 MCG/ACT inhaler Inhale 2 puffs into the lungs 2 (two) times daily. 1 Inhaler 11  . Calcium Carbonate-Vitamin D 600-400 MG-UNIT per tablet Take 1 tablet by mouth 2 (two) times daily.      . cyclobenzaprine (FLEXERIL) 10 MG tablet Take 10 mg by mouth 3 (three) times daily as needed. For severe muscle pain    . diazepam (VALIUM) 5 MG tablet Take 5 mg by mouth at bedtime as needed for anxiety or sedation.     . docusate sodium (COLACE) 100 MG capsule Take 200 mg by mouth 2 (two) times daily.     Marland Kitchen EPINEPHrine (EPIPEN) 0.3 mg/0.3 mL DEVI Inject 0.3 mLs (0.3 mg total) into the muscle once. (Patient taking differently: Inject 0.3 mg into the muscle once as needed. ) 1 Device 2  . esomeprazole (NEXIUM) 40 MG capsule Take 40 mg by mouth 2 (two) times daily.     . fentaNYL (DURAGESIC - DOSED MCG/HR) 50 MCG/HR Place 1 patch onto the skin every 3 (three) days.     Marland Kitchen gabapentin (NEURONTIN) 600 MG tablet Take 600 mg by mouth at bedtime as needed.     Marland Kitchen glucosamine-chondroitin 500-400 MG tablet Take 2 tablets by mouth 2 (two) times daily.    Marland Kitchen HYDROmorphone (DILAUDID) 4 MG tablet Take 4-8 mg by mouth every 4 (four) hours as needed. For pain    . levothyroxine (SYNTHROID, LEVOTHROID) 75 MCG tablet Take 75 mcg by mouth daily before breakfast.     . Loratadine-Pseudoephedrine  (CLARITIN-D 12 HOUR PO) Take 1 tablet by mouth 2 (two) times daily.     . Melatonin 2.5 MG CAPS Take 2.5-5 mg by mouth at bedtime.    . metaxalone (SKELAXIN) 800 MG tablet Take 800 mg by mouth every 8 (eight) hours as needed. For muscle pain    . metoprolol (LOPRESSOR) 50 MG  tablet Take 1 tablet (50 mg total) by mouth 2 (two) times daily. 180 tablet 3  . montelukast (SINGULAIR) 10 MG tablet Take 10 mg by mouth at bedtime.     . Multiple Vitamin (MULTIVITAMIN WITH MINERALS) TABS Take 1 tablet by mouth daily.    . nitroGLYCERIN (NITROSTAT) 0.4 MG SL tablet Place 1 tablet (0.4 mg total) under the tongue every 5 (five) minutes as needed. For chest pain 25 tablet 3  . nystatin (MYCOSTATIN) powder Apply topically 3 (three) times daily.    Marland Kitchen Respiratory Therapy Supplies (FLUTTER) DEVI Use as directed 1 each 0  . triamcinolone (NASACORT) 55 MCG/ACT nasal inhaler Place 1-2 sprays into the nose 2 (two) times daily.     . rivaroxaban (XARELTO) 20 MG TABS tablet Take 1 tablet (20 mg total) by mouth daily with supper. 30 tablet 11   No current facility-administered medications for this visit.    Allergies  Allergen Reactions  . Hornet Venom Anaphylaxis  . Pork-Derived Products Anaphylaxis  . Chlorhexidine Itching    Reports that it is the dye in it  . Enoxaparin Sodium     REACTION: thrombocytopenia  . Heparin     REACTION: thrombocytopenia  . Lactose Intolerance (Gi)   . Other     Sensitive to dye in Betadine & Chlorohexadine   . Povidone-Iodine     Sensitivity- but if its wiped off she is able to tolerate betadine   . Indomethacin Rash  . Phenylpropanolamine Rash    Other Reaction: other reaction  . Povidone Iodine Rash    Social History   Social History  . Marital Status: Married    Spouse Name: N/A  . Number of Children: N/A  . Years of Education: N/A   Occupational History  . Not on file.   Social History Main Topics  . Smoking status: Never Smoker   . Smokeless tobacco:  Never Used     Comment: no smoking   . Alcohol Use: No  . Drug Use: No  . Sexual Activity: Not on file   Other Topics Concern  . Not on file   Social History Narrative   Lives in [place] with husband; has had miscarriages but no children.    Disabled but exercises nearly every day (can only exericse in water - aerobics)   Takes opioids for pain relief; takes no herbal medications; has a heart healthy diet.     Family History  Problem Relation Age of Onset  . Heart disease Father     MI   . Heart disease Sister   .       Review of Systems:  As stated in the HPI and otherwise negative.   BP 120/80 mmHg  Pulse 60  Ht 5\' 1"  (1.549 m)  Wt 152 lb 9.6 oz (69.219 kg)  BMI 28.85 kg/m2  SpO2 99%  Physical Examination: General: Well developed, well nourished, NAD HEENT: OP clear, mucus membranes moist SKIN: warm, dry. No rashes. Neuro: No focal deficits Musculoskeletal: Muscle strength 5/5 all ext Psychiatric: Mood and affect normal Neck: No JVD, no carotid bruits, no thyromegaly, no lymphadenopathy. Lungs:Clear bilaterally, no wheezes, rhonci, crackles Cardiovascular: Regular rate and rhythm. No murmurs, gallops or rubs. Abdomen:Soft. Bowel sounds present. Non-tender.  Extremities: No lower extremity edema. Pulses are trace in the bilateral DP/PT.    Echo 04/12/13: Left ventricle: The cavity size was normal. Systolic function was normal. The estimated ejection fraction was in the range of 50% to 55%.  ABI: Normal June 2014  Carotid artery dopplers April 2016: Moderate bilateral stenosis, 40-59% stenosis bilateral ICA  Stress myoview: 08/13/12: Stress Procedure: The patient received IV Lexiscan 0.4 mg over 15-seconds. Technetium 57m Sestamibi injected at 30-seconds. There were significant changes with Lexiscan. The patient complained of chest pain with Lexiscan. Quantitative spect images were obtained after a 45 minute delay.  The patient complained of persistent headache  with flushing of face and neck after Lexiscan that was relieved quickly after Aminophylline 75 mg IVP given.  Stress ECG: No significant change from baseline ECG  QPS  Raw Data Images: Patient motion noted.  Stress Images: lateral wall not interpretable due to bowel artifact  Rest Images: lateral wall not interpretable due to bowel artifact  Subtraction (SDS): Normal  Transient Ischemic Dilatation (Normal <1.22): 1.03  Lung/Heart Ratio (Normal <0.45): 0.45  Quantitative Gated Spect Images  QGS EDV: 95 ml  QGS ESV: 50 ml  Impression  Exercise Capacity: Lexiscan with no exercise.  BP Response: Normal blood pressure response.  Clinical Symptoms: Headache requiring amynophylline  ECG Impression: No significant ST segment change suggestive of ischemia.  Comparison with Prior Nuclear Study: No images to compare  Overall Impression: Apical thinning no obvious ischemia Lateral wall not interpretable due to a large area of bowel artifact on both resting and stress images  LV Ejection Fraction: 47%. LV Wall Motion: Apical hypokinesis  Cardiac cath 09/22/02: 1. Ventriculography was performed in the RAO projection. Overall ejection  fraction was 70%. No segmental wall motion abnormalities were  identified.  2. The left main coronary artery has, what appears to be, about 30%  narrowing at the ostium. In most views there does appear to be a patent  ostium; however, some tapered narrowing cannot be excluded.  3. The LAD proper has about 40-50% narrowing, at most, in the mid portion  beyond the diagonal takeoff. The vessel is calcified. The diagonal  takeoff itself has a long 80% stenosis at the ostium extending out into  the mid portion of the diagonal. The distal diagonal does appear to be  suitable for grafting.  4. The large circumflex has about 70-80% focal narrowing prior to the  bifurcation of the marginal.  5. The right coronary artery has some mild luminal irregularities but no    high-grade stenoses.  IMPRESSION:  1. Coronary artery disease with significant involvement and circumflex  involvement.  2. Multiple medical issues, as described above.  EKG:  EKG is not ordered today. The ekg ordered today demonstrates   Recent Labs: 12/30/2014: Hemoglobin 13.8   Lipid Panel Followed in primary care   Wt Readings from Last 3 Encounters:  12/21/15 152 lb 9.6 oz (69.219 kg)  11/09/15 154 lb 1.9 oz (69.908 kg)  07/26/15 109 lb (49.442 kg)     Other studies Reviewed: Additional studies/ records that were reviewed today include: . Review of the above records demonstrates:    Assessment and Plan:   1. CAD: Stable. Stress test 2013 without ischemia. No chest pain. She is known to have moderate CAD. Continue current medical management   2. PAROXYSMAL ATRIAL FIBRILLATION: Sinus today. Occasional palpitations. She has not been on coumadin because she refuses due to prior issues regulating levels. I have once again discussed one of the newer agents and she is willing to start. Will start Xarelto 20 mg daily.  Labs in primary care today per pt and she will bring copy over.   3. HYPERTENSION: BP is controlled at home. No  changes  4. CAROTID ARTERY DISEASE: Stable by dopplers April 2016. Repeat April 2018.   5. Hyperlipidemia: Continue statin. Lipids followed in primary care.   Current medicines are reviewed at length with the patient today.  The patient does not have concerns regarding medicines.  The following changes have been made:  no change  Labs/ tests ordered today include:  No orders of the defined types were placed in this encounter.     Disposition:   FU with me in 6 months   Signed, Lauree Chandler, MD 12/21/2015 9:15 AM    [address], [place], [place]  [address] Phone: [phone]; Fax: [phone]

## 2015-12-21 ENCOUNTER — Encounter: Payer: Self-pay | Admitting: Cardiovascular Disease

## 2015-12-21 ENCOUNTER — Ambulatory Visit (INDEPENDENT_AMBULATORY_CARE_PROVIDER_SITE_OTHER): Payer: Medicare Other | Admitting: Cardiovascular Disease

## 2015-12-21 VITALS — BP 120/80 | HR 60 | Ht 61.0 in | Wt 152.6 lb

## 2015-12-21 DIAGNOSIS — M8580 Other specified disorders of bone density and structure, unspecified site: Secondary | ICD-10-CM | POA: Diagnosis not present

## 2015-12-21 DIAGNOSIS — I6523 Occlusion and stenosis of bilateral carotid arteries: Secondary | ICD-10-CM

## 2015-12-21 DIAGNOSIS — E785 Hyperlipidemia, unspecified: Secondary | ICD-10-CM

## 2015-12-21 DIAGNOSIS — E039 Hypothyroidism, unspecified: Secondary | ICD-10-CM | POA: Diagnosis not present

## 2015-12-21 DIAGNOSIS — I10 Essential (primary) hypertension: Secondary | ICD-10-CM | POA: Diagnosis not present

## 2015-12-21 DIAGNOSIS — R7301 Impaired fasting glucose: Secondary | ICD-10-CM | POA: Diagnosis not present

## 2015-12-21 DIAGNOSIS — I480 Paroxysmal atrial fibrillation: Secondary | ICD-10-CM

## 2015-12-21 DIAGNOSIS — E559 Vitamin D deficiency, unspecified: Secondary | ICD-10-CM | POA: Diagnosis not present

## 2015-12-21 DIAGNOSIS — I2510 Atherosclerotic heart disease of native coronary artery without angina pectoris: Secondary | ICD-10-CM

## 2015-12-21 LAB — HEPATIC FUNCTION PANEL
ALT: 22 U/L (ref 7–35)
AST: 20 U/L (ref 13–35)
Alkaline Phosphatase: 92 U/L (ref 25–125)

## 2015-12-21 LAB — CBC AND DIFFERENTIAL
HEMATOCRIT: 42 % (ref 36–46)
Hemoglobin: 14.1 g/dL (ref 12.0–16.0)
PLATELETS: 197 10*3/uL (ref 150–399)
WBC: 4.9 10^3/mL

## 2015-12-21 LAB — HEMOGLOBIN A1C: Hemoglobin A1C: 5.8

## 2015-12-21 LAB — TSH: TSH: 1.52 u[IU]/mL (ref 0.41–5.90)

## 2015-12-21 LAB — BASIC METABOLIC PANEL
BUN: 14 mg/dL (ref 4–21)
CREATININE: 0.6 mg/dL (ref 0.5–1.1)
Potassium: 4 mmol/L (ref 3.4–5.3)
Sodium: 138 mmol/L (ref 137–147)

## 2015-12-21 LAB — LIPID PANEL
CHOLESTEROL: 170 mg/dL (ref 0–200)
HDL: 59 mg/dL (ref 35–70)
Triglycerides: 144 mg/dL (ref 40–160)

## 2015-12-21 MED ORDER — RIVAROXABAN 20 MG PO TABS: 20.0000 mg | ORAL_TABLET | Freq: Every day | ORAL | Status: DC

## 2015-12-21 NOTE — Patient Instructions (Signed)
Medication Instructions:  Your physician has recommended you make the following change in your medication:  Start Xarelto 20 mg by mouth daily at dinner.    Labwork:   Bring copy of lab work from primary care to our office.    Testing/Procedures: none  Follow-Up: Your physician wants you to follow-up in: 6 months.  You will receive a reminder letter in the mail two months in advance. If you don't receive a letter, please call our office to schedule the follow-up appointment.   Any Other Special Instructions Will Be Listed Below (If Applicable).     If you need a refill on your cardiac medications before your next appointment, please call your pharmacy.

## 2015-12-26 DIAGNOSIS — Z79891 Long term (current) use of opiate analgesic: Secondary | ICD-10-CM | POA: Diagnosis not present

## 2015-12-26 DIAGNOSIS — G894 Chronic pain syndrome: Secondary | ICD-10-CM | POA: Diagnosis not present

## 2015-12-26 DIAGNOSIS — M4722 Other spondylosis with radiculopathy, cervical region: Secondary | ICD-10-CM | POA: Diagnosis not present

## 2015-12-26 DIAGNOSIS — M963 Postlaminectomy kyphosis: Secondary | ICD-10-CM | POA: Diagnosis not present

## 2015-12-28 DIAGNOSIS — Z23 Encounter for immunization: Secondary | ICD-10-CM | POA: Diagnosis not present

## 2015-12-28 DIAGNOSIS — Z1389 Encounter for screening for other disorder: Secondary | ICD-10-CM | POA: Diagnosis not present

## 2015-12-28 DIAGNOSIS — I480 Paroxysmal atrial fibrillation: Secondary | ICD-10-CM | POA: Diagnosis not present

## 2015-12-28 DIAGNOSIS — Z0001 Encounter for general adult medical examination with abnormal findings: Secondary | ICD-10-CM | POA: Diagnosis not present

## 2015-12-28 DIAGNOSIS — E785 Hyperlipidemia, unspecified: Secondary | ICD-10-CM | POA: Diagnosis not present

## 2015-12-28 DIAGNOSIS — R7301 Impaired fasting glucose: Secondary | ICD-10-CM | POA: Diagnosis not present

## 2015-12-28 DIAGNOSIS — E039 Hypothyroidism, unspecified: Secondary | ICD-10-CM | POA: Diagnosis not present

## 2015-12-28 DIAGNOSIS — Z0000 Encounter for general adult medical examination without abnormal findings: Secondary | ICD-10-CM | POA: Diagnosis not present

## 2015-12-30 ENCOUNTER — Telehealth: Payer: Self-pay | Admitting: Cardiovascular Disease

## 2015-12-30 ENCOUNTER — Encounter: Payer: Self-pay | Admitting: Cardiovascular Disease

## 2015-12-30 NOTE — Telephone Encounter (Signed)
Calling stating she dropped off lab work from Dr. Noah Delaine a week ago and hasn't heard from Dr. Angelena Form to know if she should start taking Xarelto 20 mg.  Advised that I do not see that labs have been scanned.  She ADAMANTLY says that she left the copy of the labs at front desk and had a note to be given to [facility]. She states "this happens all the time when she does what she is told to bring labs and give to [facility]" and it never happens.  She states she is" very frustrated with this practice because she feels like a number not a patient" and she has a survey to fill out and will not give high marks because this happens "all the time".  Advised will try to find the labs and make sure that Fraser Din sees them on Monday. She has not started the Xarelto because "I do what I'm told and was told Dr. Angelena Form had to review before medication could be started and I hope I don't have a clot."  Went to medical records who has a faxed copy that was received today and will scan results into system and then give copy to be placed in Dr. Camillia Herter box. Could not find copy that she brought in and gave to front desk. Will forward to Dr. Angelena Form and Alric Seton.

## 2015-12-30 NOTE — Telephone Encounter (Signed)
New Message  Pt called to discuss back to discuss labs and to see if they were received. This determines when she should begin an anticoagulant. Please call

## 2015-12-30 NOTE — Telephone Encounter (Signed)
LMTCB

## 2016-01-02 NOTE — Telephone Encounter (Signed)
Latoya [name], Can you call her and let her know that I took one day off on Friday and did not review the labs. The labs are scanned and ok. She can start the Xarelto. Please also remind her that her frustration with the labs should not be taken out on my evaluation that she gives as I have no control over the 1/2 day delay in scanning the labs. Any "low marks" she gives will negatively affect me and I think she should think this over.   Gerald Stabs

## 2016-01-02 NOTE — Telephone Encounter (Signed)
Spoke with pt and gave her information from Dr. Angelena Form. She reports on her survey she indicated concerns with communication not with provider.  She also reports primary care increased her Atorvastatin to 40 mg by mouth daily based on recent lab results.

## 2016-01-02 NOTE — Telephone Encounter (Signed)
Left message to call back  

## 2016-01-23 ENCOUNTER — Telehealth: Payer: Self-pay | Admitting: Cardiovascular Disease

## 2016-01-23 NOTE — Telephone Encounter (Signed)
Latoya [name] is calling because she has been on Xarelto for about 2 weeks and have a history of ulcers and GI Bleeding . Have develop Epigastric Pain ,and having to  drink butter milk and keep her stomach full and taking Maalox.   Otherwise she is in pain. She wants to know if she can take her Xarelto after breakfast , because she tends to eat a large breakfast and won't have so much pain.

## 2016-01-23 NOTE — Telephone Encounter (Signed)
Pt states that she took the Xarelto for 2 weeks and did fine but now has abdominal pain. Pain starts 1.5-2 hrs after taking medication. Pt states that she had loose stools one the first day but has not had any since.  Pt states she eats her largest meal at breakfast and wanted to know if she should take the Xarelto at this time instead? Pt states that she eats crackers and drinks buttermilk at night to help ease the pain. Will route to Dr. Angelena Form for review and advisement.

## 2016-01-24 DIAGNOSIS — M4722 Other spondylosis with radiculopathy, cervical region: Secondary | ICD-10-CM | POA: Diagnosis not present

## 2016-01-24 DIAGNOSIS — G894 Chronic pain syndrome: Secondary | ICD-10-CM | POA: Diagnosis not present

## 2016-01-24 DIAGNOSIS — Z79891 Long term (current) use of opiate analgesic: Secondary | ICD-10-CM | POA: Diagnosis not present

## 2016-01-24 DIAGNOSIS — M963 Postlaminectomy kyphosis: Secondary | ICD-10-CM | POA: Diagnosis not present

## 2016-01-24 NOTE — Telephone Encounter (Signed)
Pt is aware of MD's recommendations. Pt states she will call back if medication continues to bother her stomach, because even taking this medication with a full stomach at night she gets stomach pain.

## 2016-01-24 NOTE — Telephone Encounter (Signed)
That will be ok to take after breakfast. cdm

## 2016-01-30 DIAGNOSIS — Z1231 Encounter for screening mammogram for malignant neoplasm of breast: Secondary | ICD-10-CM | POA: Diagnosis not present

## 2016-02-20 DIAGNOSIS — G894 Chronic pain syndrome: Secondary | ICD-10-CM | POA: Diagnosis not present

## 2016-02-20 DIAGNOSIS — M963 Postlaminectomy kyphosis: Secondary | ICD-10-CM | POA: Diagnosis not present

## 2016-02-20 DIAGNOSIS — Z79891 Long term (current) use of opiate analgesic: Secondary | ICD-10-CM | POA: Diagnosis not present

## 2016-02-20 DIAGNOSIS — M4722 Other spondylosis with radiculopathy, cervical region: Secondary | ICD-10-CM | POA: Diagnosis not present

## 2016-02-24 ENCOUNTER — Other Ambulatory Visit: Payer: Self-pay | Admitting: Cardiovascular Disease

## 2016-03-12 DIAGNOSIS — Z85828 Personal history of other malignant neoplasm of skin: Secondary | ICD-10-CM | POA: Diagnosis not present

## 2016-03-21 DIAGNOSIS — M4722 Other spondylosis with radiculopathy, cervical region: Secondary | ICD-10-CM | POA: Diagnosis not present

## 2016-03-21 DIAGNOSIS — Z79891 Long term (current) use of opiate analgesic: Secondary | ICD-10-CM | POA: Diagnosis not present

## 2016-03-21 DIAGNOSIS — M963 Postlaminectomy kyphosis: Secondary | ICD-10-CM | POA: Diagnosis not present

## 2016-03-21 DIAGNOSIS — G894 Chronic pain syndrome: Secondary | ICD-10-CM | POA: Diagnosis not present

## 2016-04-18 DIAGNOSIS — M4722 Other spondylosis with radiculopathy, cervical region: Secondary | ICD-10-CM | POA: Diagnosis not present

## 2016-04-18 DIAGNOSIS — G894 Chronic pain syndrome: Secondary | ICD-10-CM | POA: Diagnosis not present

## 2016-04-18 DIAGNOSIS — M963 Postlaminectomy kyphosis: Secondary | ICD-10-CM | POA: Diagnosis not present

## 2016-04-18 DIAGNOSIS — Z79891 Long term (current) use of opiate analgesic: Secondary | ICD-10-CM | POA: Diagnosis not present

## 2016-05-14 DIAGNOSIS — M4722 Other spondylosis with radiculopathy, cervical region: Secondary | ICD-10-CM | POA: Diagnosis not present

## 2016-05-14 DIAGNOSIS — G894 Chronic pain syndrome: Secondary | ICD-10-CM | POA: Diagnosis not present

## 2016-05-14 DIAGNOSIS — Z79891 Long term (current) use of opiate analgesic: Secondary | ICD-10-CM | POA: Diagnosis not present

## 2016-05-14 DIAGNOSIS — M963 Postlaminectomy kyphosis: Secondary | ICD-10-CM | POA: Diagnosis not present

## 2016-05-16 DIAGNOSIS — M542 Cervicalgia: Secondary | ICD-10-CM | POA: Diagnosis not present

## 2016-05-16 DIAGNOSIS — M5412 Radiculopathy, cervical region: Secondary | ICD-10-CM | POA: Diagnosis not present

## 2016-05-16 DIAGNOSIS — D6861 Antiphospholipid syndrome: Secondary | ICD-10-CM | POA: Diagnosis not present

## 2016-05-23 DIAGNOSIS — M5412 Radiculopathy, cervical region: Secondary | ICD-10-CM | POA: Diagnosis not present

## 2016-05-23 DIAGNOSIS — M4802 Spinal stenosis, cervical region: Secondary | ICD-10-CM | POA: Diagnosis not present

## 2016-06-03 ENCOUNTER — Encounter (HOSPITAL_COMMUNITY): Payer: Self-pay | Admitting: *Deleted

## 2016-06-03 DIAGNOSIS — R945 Abnormal results of liver function studies: Secondary | ICD-10-CM | POA: Diagnosis not present

## 2016-06-03 DIAGNOSIS — J9601 Acute respiratory failure with hypoxia: Secondary | ICD-10-CM | POA: Diagnosis not present

## 2016-06-03 DIAGNOSIS — Z7951 Long term (current) use of inhaled steroids: Secondary | ICD-10-CM

## 2016-06-03 DIAGNOSIS — Z8674 Personal history of sudden cardiac arrest: Secondary | ICD-10-CM

## 2016-06-03 DIAGNOSIS — Z85828 Personal history of other malignant neoplasm of skin: Secondary | ICD-10-CM

## 2016-06-03 DIAGNOSIS — Z4682 Encounter for fitting and adjustment of non-vascular catheter: Secondary | ICD-10-CM | POA: Diagnosis not present

## 2016-06-03 DIAGNOSIS — D1803 Hemangioma of intra-abdominal structures: Secondary | ICD-10-CM | POA: Diagnosis present

## 2016-06-03 DIAGNOSIS — E669 Obesity, unspecified: Secondary | ICD-10-CM | POA: Diagnosis present

## 2016-06-03 DIAGNOSIS — A4151 Sepsis due to Escherichia coli [E. coli]: Principal | ICD-10-CM | POA: Diagnosis present

## 2016-06-03 DIAGNOSIS — Z86718 Personal history of other venous thrombosis and embolism: Secondary | ICD-10-CM

## 2016-06-03 DIAGNOSIS — E861 Hypovolemia: Secondary | ICD-10-CM | POA: Diagnosis not present

## 2016-06-03 DIAGNOSIS — D6859 Other primary thrombophilia: Secondary | ICD-10-CM | POA: Diagnosis present

## 2016-06-03 DIAGNOSIS — Z7901 Long term (current) use of anticoagulants: Secondary | ICD-10-CM

## 2016-06-03 DIAGNOSIS — Z66 Do not resuscitate: Secondary | ICD-10-CM | POA: Diagnosis not present

## 2016-06-03 DIAGNOSIS — K219 Gastro-esophageal reflux disease without esophagitis: Secondary | ICD-10-CM | POA: Diagnosis present

## 2016-06-03 DIAGNOSIS — I5042 Chronic combined systolic (congestive) and diastolic (congestive) heart failure: Secondary | ICD-10-CM | POA: Diagnosis present

## 2016-06-03 DIAGNOSIS — R1084 Generalized abdominal pain: Secondary | ICD-10-CM | POA: Diagnosis not present

## 2016-06-03 DIAGNOSIS — G8929 Other chronic pain: Secondary | ICD-10-CM | POA: Diagnosis present

## 2016-06-03 DIAGNOSIS — Z7982 Long term (current) use of aspirin: Secondary | ICD-10-CM

## 2016-06-03 DIAGNOSIS — K7689 Other specified diseases of liver: Secondary | ICD-10-CM | POA: Diagnosis not present

## 2016-06-03 DIAGNOSIS — R079 Chest pain, unspecified: Secondary | ICD-10-CM | POA: Diagnosis not present

## 2016-06-03 DIAGNOSIS — A4189 Other specified sepsis: Secondary | ICD-10-CM | POA: Diagnosis present

## 2016-06-03 DIAGNOSIS — Z888 Allergy status to other drugs, medicaments and biological substances status: Secondary | ICD-10-CM

## 2016-06-03 DIAGNOSIS — J4540 Moderate persistent asthma, uncomplicated: Secondary | ICD-10-CM | POA: Diagnosis present

## 2016-06-03 DIAGNOSIS — F419 Anxiety disorder, unspecified: Secondary | ICD-10-CM | POA: Diagnosis present

## 2016-06-03 DIAGNOSIS — R1310 Dysphagia, unspecified: Secondary | ICD-10-CM | POA: Diagnosis not present

## 2016-06-03 DIAGNOSIS — E876 Hypokalemia: Secondary | ICD-10-CM | POA: Diagnosis not present

## 2016-06-03 DIAGNOSIS — G9341 Metabolic encephalopathy: Secondary | ICD-10-CM | POA: Diagnosis not present

## 2016-06-03 DIAGNOSIS — R6521 Severe sepsis with septic shock: Secondary | ICD-10-CM | POA: Diagnosis not present

## 2016-06-03 DIAGNOSIS — R109 Unspecified abdominal pain: Secondary | ICD-10-CM | POA: Diagnosis not present

## 2016-06-03 DIAGNOSIS — Z79899 Other long term (current) drug therapy: Secondary | ICD-10-CM

## 2016-06-03 DIAGNOSIS — K3184 Gastroparesis: Secondary | ICD-10-CM | POA: Diagnosis present

## 2016-06-03 DIAGNOSIS — I110 Hypertensive heart disease with heart failure: Secondary | ICD-10-CM | POA: Diagnosis present

## 2016-06-03 DIAGNOSIS — N170 Acute kidney failure with tubular necrosis: Secondary | ICD-10-CM | POA: Diagnosis not present

## 2016-06-03 DIAGNOSIS — K830 Cholangitis: Secondary | ICD-10-CM | POA: Diagnosis not present

## 2016-06-03 DIAGNOSIS — I2510 Atherosclerotic heart disease of native coronary artery without angina pectoris: Secondary | ICD-10-CM | POA: Diagnosis present

## 2016-06-03 DIAGNOSIS — E785 Hyperlipidemia, unspecified: Secondary | ICD-10-CM | POA: Diagnosis present

## 2016-06-03 DIAGNOSIS — A4181 Sepsis due to Enterococcus: Secondary | ICD-10-CM | POA: Diagnosis present

## 2016-06-03 DIAGNOSIS — R0602 Shortness of breath: Secondary | ICD-10-CM | POA: Diagnosis not present

## 2016-06-03 DIAGNOSIS — E870 Hyperosmolality and hypernatremia: Secondary | ICD-10-CM | POA: Diagnosis not present

## 2016-06-03 DIAGNOSIS — R1011 Right upper quadrant pain: Secondary | ICD-10-CM | POA: Diagnosis not present

## 2016-06-03 DIAGNOSIS — E872 Acidosis: Secondary | ICD-10-CM | POA: Diagnosis present

## 2016-06-03 DIAGNOSIS — Z86711 Personal history of pulmonary embolism: Secondary | ICD-10-CM

## 2016-06-03 DIAGNOSIS — R7989 Other specified abnormal findings of blood chemistry: Secondary | ICD-10-CM | POA: Diagnosis not present

## 2016-06-03 DIAGNOSIS — Z96652 Presence of left artificial knee joint: Secondary | ICD-10-CM | POA: Diagnosis present

## 2016-06-03 DIAGNOSIS — T508X5A Adverse effect of diagnostic agents, initial encounter: Secondary | ICD-10-CM | POA: Diagnosis not present

## 2016-06-03 DIAGNOSIS — Z6833 Body mass index (BMI) 33.0-33.9, adult: Secondary | ICD-10-CM

## 2016-06-03 DIAGNOSIS — I480 Paroxysmal atrial fibrillation: Secondary | ICD-10-CM | POA: Diagnosis present

## 2016-06-03 DIAGNOSIS — Z981 Arthrodesis status: Secondary | ICD-10-CM

## 2016-06-03 DIAGNOSIS — E039 Hypothyroidism, unspecified: Secondary | ICD-10-CM | POA: Diagnosis present

## 2016-06-03 NOTE — ED Triage Notes (Signed)
The pt is c/o c hest pain sob and chills since 1730 today  No fever reducer taken

## 2016-06-04 ENCOUNTER — Emergency Department (HOSPITAL_COMMUNITY): Payer: Medicare Other

## 2016-06-04 ENCOUNTER — Inpatient Hospital Stay (HOSPITAL_COMMUNITY): Payer: Medicare Other

## 2016-06-04 ENCOUNTER — Encounter (HOSPITAL_COMMUNITY): Payer: Self-pay | Admitting: Radiology

## 2016-06-04 ENCOUNTER — Inpatient Hospital Stay (HOSPITAL_COMMUNITY)
Admission: EM | Admit: 2016-06-04 | Discharge: 2016-06-21 | DRG: 870 | Disposition: A | Discharge status: Skilled Nursing Facility | Payer: Medicare Other | Attending: Internal Medicine | Admitting: Internal Medicine

## 2016-06-04 DIAGNOSIS — K830 Cholangitis: Secondary | ICD-10-CM

## 2016-06-04 DIAGNOSIS — E870 Hyperosmolality and hypernatremia: Secondary | ICD-10-CM | POA: Diagnosis not present

## 2016-06-04 DIAGNOSIS — I2510 Atherosclerotic heart disease of native coronary artery without angina pectoris: Secondary | ICD-10-CM | POA: Diagnosis present

## 2016-06-04 DIAGNOSIS — A4189 Other specified sepsis: Secondary | ICD-10-CM | POA: Diagnosis present

## 2016-06-04 DIAGNOSIS — K3184 Gastroparesis: Secondary | ICD-10-CM | POA: Diagnosis not present

## 2016-06-04 DIAGNOSIS — Z452 Encounter for adjustment and management of vascular access device: Secondary | ICD-10-CM

## 2016-06-04 DIAGNOSIS — R918 Other nonspecific abnormal finding of lung field: Secondary | ICD-10-CM | POA: Diagnosis not present

## 2016-06-04 DIAGNOSIS — K819 Cholecystitis, unspecified: Secondary | ICD-10-CM | POA: Diagnosis not present

## 2016-06-04 DIAGNOSIS — A4151 Sepsis due to Escherichia coli [E. coli]: Secondary | ICD-10-CM | POA: Diagnosis present

## 2016-06-04 DIAGNOSIS — R945 Abnormal results of liver function studies: Secondary | ICD-10-CM

## 2016-06-04 DIAGNOSIS — A419 Sepsis, unspecified organism: Secondary | ICD-10-CM | POA: Diagnosis not present

## 2016-06-04 DIAGNOSIS — K810 Acute cholecystitis: Secondary | ICD-10-CM | POA: Diagnosis not present

## 2016-06-04 DIAGNOSIS — K829 Disease of gallbladder, unspecified: Secondary | ICD-10-CM | POA: Diagnosis not present

## 2016-06-04 DIAGNOSIS — N179 Acute kidney failure, unspecified: Secondary | ICD-10-CM | POA: Diagnosis not present

## 2016-06-04 DIAGNOSIS — E876 Hypokalemia: Secondary | ICD-10-CM | POA: Diagnosis not present

## 2016-06-04 DIAGNOSIS — Z9289 Personal history of other medical treatment: Secondary | ICD-10-CM

## 2016-06-04 DIAGNOSIS — E861 Hypovolemia: Secondary | ICD-10-CM | POA: Diagnosis not present

## 2016-06-04 DIAGNOSIS — J9601 Acute respiratory failure with hypoxia: Secondary | ICD-10-CM | POA: Diagnosis not present

## 2016-06-04 DIAGNOSIS — I4891 Unspecified atrial fibrillation: Secondary | ICD-10-CM | POA: Diagnosis present

## 2016-06-04 DIAGNOSIS — R2681 Unsteadiness on feet: Secondary | ICD-10-CM | POA: Diagnosis not present

## 2016-06-04 DIAGNOSIS — Z86718 Personal history of other venous thrombosis and embolism: Secondary | ICD-10-CM | POA: Diagnosis not present

## 2016-06-04 DIAGNOSIS — K219 Gastro-esophageal reflux disease without esophagitis: Secondary | ICD-10-CM | POA: Diagnosis present

## 2016-06-04 DIAGNOSIS — R1011 Right upper quadrant pain: Secondary | ICD-10-CM

## 2016-06-04 DIAGNOSIS — K7689 Other specified diseases of liver: Secondary | ICD-10-CM | POA: Diagnosis not present

## 2016-06-04 DIAGNOSIS — J80 Acute respiratory distress syndrome: Secondary | ICD-10-CM | POA: Diagnosis not present

## 2016-06-04 DIAGNOSIS — D1803 Hemangioma of intra-abdominal structures: Secondary | ICD-10-CM | POA: Diagnosis present

## 2016-06-04 DIAGNOSIS — I480 Paroxysmal atrial fibrillation: Secondary | ICD-10-CM | POA: Diagnosis not present

## 2016-06-04 DIAGNOSIS — J9600 Acute respiratory failure, unspecified whether with hypoxia or hypercapnia: Secondary | ICD-10-CM | POA: Diagnosis not present

## 2016-06-04 DIAGNOSIS — J9690 Respiratory failure, unspecified, unspecified whether with hypoxia or hypercapnia: Secondary | ICD-10-CM

## 2016-06-04 DIAGNOSIS — R0602 Shortness of breath: Secondary | ICD-10-CM | POA: Diagnosis not present

## 2016-06-04 DIAGNOSIS — R6520 Severe sepsis without septic shock: Secondary | ICD-10-CM | POA: Diagnosis present

## 2016-06-04 DIAGNOSIS — R7881 Bacteremia: Secondary | ICD-10-CM | POA: Diagnosis not present

## 2016-06-04 DIAGNOSIS — R1013 Epigastric pain: Secondary | ICD-10-CM | POA: Diagnosis not present

## 2016-06-04 DIAGNOSIS — I110 Hypertensive heart disease with heart failure: Secondary | ICD-10-CM | POA: Diagnosis present

## 2016-06-04 DIAGNOSIS — J4540 Moderate persistent asthma, uncomplicated: Secondary | ICD-10-CM | POA: Diagnosis present

## 2016-06-04 DIAGNOSIS — Z66 Do not resuscitate: Secondary | ICD-10-CM | POA: Diagnosis not present

## 2016-06-04 DIAGNOSIS — R079 Chest pain, unspecified: Secondary | ICD-10-CM | POA: Diagnosis not present

## 2016-06-04 DIAGNOSIS — Z888 Allergy status to other drugs, medicaments and biological substances status: Secondary | ICD-10-CM | POA: Diagnosis not present

## 2016-06-04 DIAGNOSIS — G9340 Encephalopathy, unspecified: Secondary | ICD-10-CM | POA: Diagnosis not present

## 2016-06-04 DIAGNOSIS — D6859 Other primary thrombophilia: Secondary | ICD-10-CM | POA: Diagnosis present

## 2016-06-04 DIAGNOSIS — G9341 Metabolic encephalopathy: Secondary | ICD-10-CM | POA: Diagnosis not present

## 2016-06-04 DIAGNOSIS — M6281 Muscle weakness (generalized): Secondary | ICD-10-CM | POA: Diagnosis not present

## 2016-06-04 DIAGNOSIS — I5042 Chronic combined systolic (congestive) and diastolic (congestive) heart failure: Secondary | ICD-10-CM | POA: Diagnosis present

## 2016-06-04 DIAGNOSIS — Z4682 Encounter for fitting and adjustment of non-vascular catheter: Secondary | ICD-10-CM | POA: Diagnosis not present

## 2016-06-04 DIAGNOSIS — R6521 Severe sepsis with septic shock: Secondary | ICD-10-CM | POA: Diagnosis present

## 2016-06-04 DIAGNOSIS — Z48815 Encounter for surgical aftercare following surgery on the digestive system: Secondary | ICD-10-CM | POA: Diagnosis not present

## 2016-06-04 DIAGNOSIS — K8309 Other cholangitis: Secondary | ICD-10-CM | POA: Diagnosis present

## 2016-06-04 DIAGNOSIS — E039 Hypothyroidism, unspecified: Secondary | ICD-10-CM | POA: Diagnosis not present

## 2016-06-04 DIAGNOSIS — R1312 Dysphagia, oropharyngeal phase: Secondary | ICD-10-CM | POA: Diagnosis not present

## 2016-06-04 DIAGNOSIS — R7989 Other specified abnormal findings of blood chemistry: Secondary | ICD-10-CM

## 2016-06-04 DIAGNOSIS — B952 Enterococcus as the cause of diseases classified elsewhere: Secondary | ICD-10-CM | POA: Diagnosis present

## 2016-06-04 DIAGNOSIS — R279 Unspecified lack of coordination: Secondary | ICD-10-CM | POA: Diagnosis not present

## 2016-06-04 DIAGNOSIS — Z4659 Encounter for fitting and adjustment of other gastrointestinal appliance and device: Secondary | ICD-10-CM

## 2016-06-04 DIAGNOSIS — R1310 Dysphagia, unspecified: Secondary | ICD-10-CM | POA: Diagnosis not present

## 2016-06-04 DIAGNOSIS — N170 Acute kidney failure with tubular necrosis: Secondary | ICD-10-CM | POA: Diagnosis not present

## 2016-06-04 DIAGNOSIS — B9620 Unspecified Escherichia coli [E. coli] as the cause of diseases classified elsewhere: Secondary | ICD-10-CM | POA: Diagnosis not present

## 2016-06-04 DIAGNOSIS — R109 Unspecified abdominal pain: Secondary | ICD-10-CM | POA: Diagnosis not present

## 2016-06-04 DIAGNOSIS — B961 Klebsiella pneumoniae [K. pneumoniae] as the cause of diseases classified elsewhere: Secondary | ICD-10-CM | POA: Diagnosis not present

## 2016-06-04 DIAGNOSIS — E872 Acidosis: Secondary | ICD-10-CM | POA: Diagnosis present

## 2016-06-04 DIAGNOSIS — Z978 Presence of other specified devices: Secondary | ICD-10-CM

## 2016-06-04 DIAGNOSIS — R1084 Generalized abdominal pain: Secondary | ICD-10-CM | POA: Diagnosis not present

## 2016-06-04 HISTORY — PX: IR GENERIC HISTORICAL: IMG1180011

## 2016-06-04 LAB — CBC
HEMATOCRIT: 47.9 % — AB (ref 36.0–46.0)
Hemoglobin: 16 g/dL — ABNORMAL HIGH (ref 12.0–15.0)
MCH: 29.3 pg (ref 26.0–34.0)
MCHC: 33.4 g/dL (ref 30.0–36.0)
MCV: 87.6 fL (ref 78.0–100.0)
PLATELETS: 283 10*3/uL (ref 150–400)
RBC: 5.47 MIL/uL — AB (ref 3.87–5.11)
RDW: 13.8 % (ref 11.5–15.5)
WBC: 15.9 10*3/uL — AB (ref 4.0–10.5)

## 2016-06-04 LAB — I-STAT ARTERIAL BLOOD GAS, ED
ACID-BASE DEFICIT: 7 mmol/L — AB (ref 0.0–2.0)
BICARBONATE: 17.5 meq/L — AB (ref 20.0–24.0)
O2 Saturation: 92 %
PO2 ART: 72 mmHg — AB (ref 80.0–100.0)
Patient temperature: 100.9
TCO2: 18 mmol/L (ref 0–100)
pCO2 arterial: 32.9 mmHg — ABNORMAL LOW (ref 35.0–45.0)
pH, Arterial: 7.339 — ABNORMAL LOW (ref 7.350–7.450)

## 2016-06-04 LAB — TROPONIN I: Troponin I: <0.03 ng/mL (ref <0.03)

## 2016-06-04 LAB — COMPREHENSIVE METABOLIC PANEL
ALBUMIN: 3.4 g/dL — AB (ref 3.5–5.0)
ALBUMIN: 4.5 g/dL (ref 3.5–5.0)
ALK PHOS: 129 U/L — AB (ref 38–126)
ALK PHOS: 138 U/L — AB (ref 38–126)
ALT: 291 U/L — ABNORMAL HIGH (ref 14–54)
ALT: 486 U/L — AB (ref 14–54)
ANION GAP: 15 (ref 5–15)
AST: 500 U/L — ABNORMAL HIGH (ref 15–41)
AST: 752 U/L — AB (ref 15–41)
Anion gap: 12 (ref 5–15)
BILIRUBIN TOTAL: 2.5 mg/dL — AB (ref 0.3–1.2)
BUN: 14 mg/dL (ref 6–20)
BUN: 13 mg/dL (ref 6–20)
CALCIUM: 9.6 mg/dL (ref 8.9–10.3)
CHLORIDE: 98 mmol/L — AB (ref 101–111)
CO2: 21 mmol/L — AB (ref 22–32)
CO2: 22 mmol/L (ref 22–32)
CREATININE: 0.68 mg/dL (ref 0.44–1.00)
Calcium: 8.9 mg/dL (ref 8.9–10.3)
Chloride: 105 mmol/L (ref 101–111)
Creatinine, Ser: 0.68 mg/dL (ref 0.44–1.00)
GFR calc Af Amer: >60 mL/min (ref >60)
GFR calc non Af Amer: >60 mL/min (ref >60)
GFR calc non Af Amer: >60 mL/min (ref >60)
GLUCOSE: 159 mg/dL — AB (ref 65–99)
GLUCOSE: 171 mg/dL — AB (ref 65–99)
POTASSIUM: 3.2 mmol/L — AB (ref 3.5–5.1)
Potassium: 4 mmol/L (ref 3.5–5.1)
SODIUM: 134 mmol/L — AB (ref 135–145)
Sodium: 139 mmol/L (ref 135–145)
TOTAL PROTEIN: 5.7 g/dL — AB (ref 6.5–8.1)
Total Bilirubin: 2.7 mg/dL — ABNORMAL HIGH (ref 0.3–1.2)
Total Protein: 7.6 g/dL (ref 6.5–8.1)

## 2016-06-04 LAB — BLOOD CULTURE ID PANEL (REFLEXED)
Acinetobacter baumannii: NOT DETECTED
CANDIDA ALBICANS: NOT DETECTED
CANDIDA GLABRATA: NOT DETECTED
CANDIDA KRUSEI: NOT DETECTED
CANDIDA TROPICALIS: NOT DETECTED
Candida parapsilosis: NOT DETECTED
Carbapenem resistance: NOT DETECTED
ENTEROBACTER CLOACAE COMPLEX: NOT DETECTED
ESCHERICHIA COLI: DETECTED — AB
Enterobacteriaceae species: DETECTED — AB
Enterococcus species: NOT DETECTED
Haemophilus influenzae: NOT DETECTED
KLEBSIELLA OXYTOCA: NOT DETECTED
KLEBSIELLA PNEUMONIAE: NOT DETECTED
LISTERIA MONOCYTOGENES: NOT DETECTED
Methicillin resistance: NOT DETECTED
Neisseria meningitidis: NOT DETECTED
PROTEUS SPECIES: NOT DETECTED
Pseudomonas aeruginosa: NOT DETECTED
SERRATIA MARCESCENS: NOT DETECTED
STREPTOCOCCUS PNEUMONIAE: NOT DETECTED
Staphylococcus aureus (BCID): NOT DETECTED
Staphylococcus species: NOT DETECTED
Streptococcus agalactiae: NOT DETECTED
Streptococcus pyogenes: NOT DETECTED
Streptococcus species: NOT DETECTED
Vancomycin resistance: NOT DETECTED

## 2016-06-04 LAB — URINALYSIS, ROUTINE W REFLEX MICROSCOPIC
Glucose, UA: NEGATIVE mg/dL
Ketones, ur: NEGATIVE mg/dL
LEUKOCYTES UA: NEGATIVE
NITRITE: NEGATIVE
PH: 5 (ref 5.0–8.0)
Protein, ur: 100 mg/dL — AB
SPECIFIC GRAVITY, URINE: 1.021 (ref 1.005–1.030)

## 2016-06-04 LAB — BLOOD GAS, ARTERIAL
Acid-base deficit: 7.8 mmol/L — ABNORMAL HIGH (ref 0.0–2.0)
Bicarbonate: 17 mEq/L — ABNORMAL LOW (ref 20.0–24.0)
Drawn by: 313941
FIO2: 100
LHR: 30 {breaths}/min
MECHVT: 400 mL
O2 Saturation: 99.2 %
PATIENT TEMPERATURE: 103.5
PCO2 ART: 37.6 mmHg (ref 35.0–45.0)
PEEP: 5 cmH2O
PO2 ART: 226 mmHg — AB (ref 80.0–100.0)
TCO2: 18 mmol/L (ref 0–100)
pH, Arterial: 7.294 — ABNORMAL LOW (ref 7.350–7.450)

## 2016-06-04 LAB — CORTISOL: Cortisol, Plasma: 59 ug/dL

## 2016-06-04 LAB — I-STAT CG4 LACTIC ACID, ED
LACTIC ACID, VENOUS: 6.88 mmol/L — AB (ref 0.5–1.9)
LACTIC ACID, VENOUS: 6.06 mmol/L — AB (ref 0.5–1.9)
Lactic Acid, Venous: 6.95 mmol/L (ref 0.5–1.9)

## 2016-06-04 LAB — PROTIME-INR
INR: 1.58
PROTHROMBIN TIME: 19 s — AB (ref 11.4–15.2)

## 2016-06-04 LAB — LIPASE, BLOOD: Lipase: 26 U/L (ref 11–51)

## 2016-06-04 LAB — MRSA PCR SCREENING: MRSA BY PCR: NEGATIVE

## 2016-06-04 LAB — GLUCOSE, CAPILLARY
GLUCOSE-CAPILLARY: 138 mg/dL — AB (ref 65–99)
GLUCOSE-CAPILLARY: 79 mg/dL (ref 65–99)

## 2016-06-04 LAB — URINE MICROSCOPIC-ADD ON

## 2016-06-04 LAB — TRIGLYCERIDES: TRIGLYCERIDES: 217 mg/dL — AB (ref <150)

## 2016-06-04 LAB — APTT: APTT: 69 s — AB (ref 24–36)

## 2016-06-04 MED ORDER — MONTELUKAST SODIUM 10 MG PO TABS
10.0000 mg | ORAL_TABLET | Freq: Every day | ORAL | Status: DC
  Administered 2016-06-04 – 2016-06-06 (×3): 10 mg via ORAL
  Filled 2016-06-04 (×3): qty 1

## 2016-06-04 MED ORDER — FENTANYL CITRATE (PF) 2500 MCG/50ML IJ SOLN
25.0000 ug/h | INTRAMUSCULAR | Status: DC
  Administered 2016-06-04: 25 ug/h via INTRAVENOUS
  Administered 2016-06-05: 200 ug/h via INTRAVENOUS
  Administered 2016-06-05: 50 ug/h via INTRAVENOUS
  Administered 2016-06-06: 100 ug/h via INTRAVENOUS
  Administered 2016-06-06 – 2016-06-07 (×2): 300 ug/h via INTRAVENOUS
  Administered 2016-06-07: 275 ug/h via INTRAVENOUS
  Administered 2016-06-08 (×2): 300 ug/h via INTRAVENOUS
  Administered 2016-06-09: 150 ug/h via INTRAVENOUS
  Administered 2016-06-09: 200 ug/h via INTRAVENOUS
  Administered 2016-06-10: 175 ug/h via INTRAVENOUS
  Administered 2016-06-11: 100 ug/h via INTRAVENOUS
  Administered 2016-06-11: 175 ug/h via INTRAVENOUS
  Administered 2016-06-13: 75 ug/h via INTRAVENOUS
  Filled 2016-06-04 (×14): qty 50

## 2016-06-04 MED ORDER — POTASSIUM CHLORIDE 20 MEQ/15ML (10%) PO SOLN
40.0000 meq | Freq: Once | ORAL | Status: AC
  Administered 2016-06-04: 40 meq
  Filled 2016-06-04: qty 30

## 2016-06-04 MED ORDER — LEVOTHYROXINE SODIUM 75 MCG PO TABS
75.0000 ug | ORAL_TABLET | Freq: Every day | ORAL | Status: DC
  Administered 2016-06-04: 75 ug via ORAL
  Filled 2016-06-04 (×2): qty 1

## 2016-06-04 MED ORDER — SODIUM CHLORIDE 0.9 % IV BOLUS (SEPSIS)
2000.0000 mL | Freq: Once | INTRAVENOUS | Status: AC
  Administered 2016-06-04: 2000 mL via INTRAVENOUS

## 2016-06-04 MED ORDER — MIDAZOLAM HCL 2 MG/2ML IJ SOLN
2.0000 mg | Freq: Once | INTRAMUSCULAR | Status: AC
  Administered 2016-06-04: 2 mg via INTRAVENOUS

## 2016-06-04 MED ORDER — SODIUM CHLORIDE 0.9 % IV BOLUS (SEPSIS)
1000.0000 mL | Freq: Once | INTRAVENOUS | Status: AC
  Administered 2016-06-04: 1000 mL via INTRAVENOUS

## 2016-06-04 MED ORDER — ACETAMINOPHEN 325 MG PO TABS
650.0000 mg | ORAL_TABLET | Freq: Once | ORAL | Status: AC
  Administered 2016-06-04: 650 mg via ORAL
  Filled 2016-06-04: qty 2

## 2016-06-04 MED ORDER — LIDOCAINE HCL 1 % IJ SOLN
INTRAMUSCULAR | Status: AC
  Filled 2016-06-04: qty 20

## 2016-06-04 MED ORDER — DEXTROSE 5 % IV SOLN
0.0000 ug/min | INTRAVENOUS | Status: DC
  Administered 2016-06-04: 8 ug/min via INTRAVENOUS
  Administered 2016-06-04 – 2016-06-05 (×2): 5 ug/min via INTRAVENOUS
  Filled 2016-06-04 (×3): qty 4

## 2016-06-04 MED ORDER — IOPAMIDOL (ISOVUE-370) INJECTION 76%
INTRAVENOUS | Status: AC
  Administered 2016-06-04: 02:00:00
  Filled 2016-06-04: qty 100

## 2016-06-04 MED ORDER — ETOMIDATE 2 MG/ML IV SOLN
20.0000 mg | Freq: Once | INTRAVENOUS | Status: AC
Start: 2016-06-04 — End: 2016-06-04
  Administered 2016-06-04: 20 mg via INTRAVENOUS

## 2016-06-04 MED ORDER — PROPOFOL 1000 MG/100ML IV EMUL
5.0000 ug/kg/min | INTRAVENOUS | Status: DC
  Filled 2016-06-04: qty 100

## 2016-06-04 MED ORDER — PANTOPRAZOLE SODIUM 40 MG PO PACK
40.0000 mg | PACK | Freq: Two times a day (BID) | ORAL | Status: DC
  Administered 2016-06-04 (×2): 40 mg
  Filled 2016-06-04 (×3): qty 20

## 2016-06-04 MED ORDER — SUCCINYLCHOLINE CHLORIDE 20 MG/ML IJ SOLN
100.0000 mg | Freq: Once | INTRAMUSCULAR | Status: AC
  Administered 2016-06-04: 100 mg via INTRAVENOUS
  Filled 2016-06-04: qty 5

## 2016-06-04 MED ORDER — PIPERACILLIN-TAZOBACTAM 3.375 G IVPB
3.3750 g | Freq: Three times a day (TID) | INTRAVENOUS | Status: DC
Start: 2016-06-04 — End: 2016-06-07
  Administered 2016-06-04 – 2016-06-07 (×10): 3.375 g via INTRAVENOUS
  Filled 2016-06-04 (×12): qty 50

## 2016-06-04 MED ORDER — BUDESONIDE 0.5 MG/2ML IN SUSP
0.2500 mg | Freq: Two times a day (BID) | RESPIRATORY_TRACT | Status: DC
  Administered 2016-06-04: 0.25 mg via RESPIRATORY_TRACT
  Filled 2016-06-04 (×2): qty 2

## 2016-06-04 MED ORDER — ONDANSETRON HCL 4 MG/2ML IJ SOLN
4.0000 mg | Freq: Once | INTRAMUSCULAR | Status: AC
  Administered 2016-06-04: 4 mg via INTRAVENOUS
  Filled 2016-06-04: qty 2

## 2016-06-04 MED ORDER — SODIUM CHLORIDE 0.9 % IV SOLN
INTRAVENOUS | Status: DC
  Administered 2016-06-04: 19:00:00 via INTRAVENOUS

## 2016-06-04 MED ORDER — BUDESONIDE 0.25 MG/2ML IN SUSP
0.2500 mg | Freq: Two times a day (BID) | RESPIRATORY_TRACT | Status: DC
Start: 2016-06-04 — End: 2016-06-08
  Administered 2016-06-04 – 2016-06-08 (×9): 0.25 mg via RESPIRATORY_TRACT
  Filled 2016-06-04 (×10): qty 2

## 2016-06-04 MED ORDER — ACETAMINOPHEN 650 MG RE SUPP
975.0000 mg | Freq: Once | RECTAL | Status: AC
  Administered 2016-06-04: 975 mg via RECTAL

## 2016-06-04 MED ORDER — SODIUM CHLORIDE 0.9 % IV BOLUS (SEPSIS)
500.0000 mL | Freq: Once | INTRAVENOUS | Status: AC
  Administered 2016-06-04: 500 mL via INTRAVENOUS

## 2016-06-04 MED ORDER — MIDAZOLAM HCL 2 MG/2ML IJ SOLN
INTRAMUSCULAR | Status: AC
  Administered 2016-06-04: 2 mg via INTRAVENOUS
  Filled 2016-06-04: qty 2

## 2016-06-04 MED ORDER — ANTISEPTIC ORAL RINSE SOLUTION (CORINZ)
7.0000 mL | Freq: Four times a day (QID) | OROMUCOSAL | Status: DC
  Administered 2016-06-04 (×2): 7 mL via OROMUCOSAL

## 2016-06-04 MED ORDER — SODIUM CHLORIDE 0.9 % IV SOLN
0.0410 mg/kg/h | INTRAVENOUS | Status: DC
  Administered 2016-06-04 – 2016-06-06 (×2): 0.1 mg/kg/h via INTRAVENOUS
  Filled 2016-06-04 (×2): qty 250

## 2016-06-04 MED ORDER — ANTISEPTIC ORAL RINSE SOLUTION (CORINZ)
7.0000 mL | OROMUCOSAL | Status: DC
  Administered 2016-06-04 – 2016-06-16 (×68): 7 mL via OROMUCOSAL

## 2016-06-04 MED ORDER — IPRATROPIUM-ALBUTEROL 0.5-2.5 (3) MG/3ML IN SOLN
3.0000 mL | Freq: Four times a day (QID) | RESPIRATORY_TRACT | Status: DC
  Administered 2016-06-04 – 2016-06-08 (×17): 3 mL via RESPIRATORY_TRACT
  Filled 2016-06-04 (×18): qty 3

## 2016-06-04 MED ORDER — HYDROCORTISONE NA SUCCINATE PF 100 MG IJ SOLR
50.0000 mg | Freq: Four times a day (QID) | INTRAMUSCULAR | Status: DC
  Administered 2016-06-04 – 2016-06-07 (×13): 50 mg via INTRAVENOUS
  Filled 2016-06-04 (×7): qty 1
  Filled 2016-06-04: qty 2
  Filled 2016-06-04 (×5): qty 1

## 2016-06-04 MED ORDER — SODIUM CHLORIDE 0.9 % IV SOLN
250.0000 mL | INTRAVENOUS | Status: DC | PRN
  Administered 2016-06-04 – 2016-06-06 (×2): 250 mL via INTRAVENOUS

## 2016-06-04 MED ORDER — LIDOCAINE HCL 1 % IJ SOLN
INTRAMUSCULAR | Status: DC | PRN
  Administered 2016-06-04: 10 mL

## 2016-06-04 MED ORDER — PIPERACILLIN-TAZOBACTAM 3.375 G IVPB 30 MIN
3.3750 g | Freq: Once | INTRAVENOUS | Status: AC
  Administered 2016-06-04: 3.375 g via INTRAVENOUS
  Filled 2016-06-04: qty 50

## 2016-06-04 MED ORDER — LACTATED RINGERS IV BOLUS (SEPSIS)
500.0000 mL | Freq: Once | INTRAVENOUS | Status: AC
  Administered 2016-06-04: 500 mL via INTRAVENOUS

## 2016-06-04 MED ORDER — VANCOMYCIN HCL 10 G IV SOLR
1500.0000 mg | Freq: Once | INTRAVENOUS | Status: AC
  Administered 2016-06-04: 1500 mg via INTRAVENOUS
  Filled 2016-06-04: qty 1500

## 2016-06-04 MED ORDER — PROPOFOL 1000 MG/100ML IV EMUL
5.0000 ug/kg/min | INTRAVENOUS | Status: DC
  Administered 2016-06-04: 20 ug/kg/min via INTRAVENOUS
  Filled 2016-06-04: qty 100

## 2016-06-04 MED ORDER — VANCOMYCIN HCL IN DEXTROSE 750-5 MG/150ML-% IV SOLN
750.0000 mg | Freq: Two times a day (BID) | INTRAVENOUS | Status: DC
  Administered 2016-06-05 (×3): 750 mg via INTRAVENOUS
  Filled 2016-06-04 (×4): qty 150

## 2016-06-04 NOTE — Progress Notes (Signed)
PHARMACY - PHYSICIAN COMMUNICATION CRITICAL VALUE ALERT - BLOOD CULTURE IDENTIFICATION (BCID)  Results for orders placed or performed during the hospital encounter of 06/04/16  Blood Culture ID Panel (Reflexed) (Collected: 06/04/2016 12:45 AM)  Result Value Ref Range   Enterococcus species NOT DETECTED NOT DETECTED   Vancomycin resistance NOT DETECTED NOT DETECTED   Listeria monocytogenes NOT DETECTED NOT DETECTED   Staphylococcus species NOT DETECTED NOT DETECTED   Staphylococcus aureus NOT DETECTED NOT DETECTED   Methicillin resistance NOT DETECTED NOT DETECTED   Streptococcus species NOT DETECTED NOT DETECTED   Streptococcus agalactiae NOT DETECTED NOT DETECTED   Streptococcus pneumoniae NOT DETECTED NOT DETECTED   Streptococcus pyogenes NOT DETECTED NOT DETECTED   Acinetobacter baumannii NOT DETECTED NOT DETECTED   Enterobacteriaceae species DETECTED (A) NOT DETECTED   Enterobacter cloacae complex NOT DETECTED NOT DETECTED   Escherichia coli DETECTED (A) NOT DETECTED   Klebsiella oxytoca NOT DETECTED NOT DETECTED   Klebsiella pneumoniae NOT DETECTED NOT DETECTED   Proteus species NOT DETECTED NOT DETECTED   Serratia marcescens NOT DETECTED NOT DETECTED   Carbapenem resistance NOT DETECTED NOT DETECTED   Haemophilus influenzae NOT DETECTED NOT DETECTED   Neisseria meningitidis NOT DETECTED NOT DETECTED   Pseudomonas aeruginosa NOT DETECTED NOT DETECTED   Candida albicans NOT DETECTED NOT DETECTED   Candida glabrata NOT DETECTED NOT DETECTED   Candida krusei NOT DETECTED NOT DETECTED   Candida parapsilosis NOT DETECTED NOT DETECTED   Candida tropicalis NOT DETECTED NOT DETECTED    Name of physician (or Provider) Contacted: Jennet Maduro  Changes to prescribed antibiotics required: No changes indicated at this time, Zosyn appropriate given severity of patient's illness and vancomycin appropriate given GPC noted in one blood culture. Can consider de-escalation of Zosyn to CTX once  clinically appropriate.  Arrie Senate, PharmD PGY-1 Pharmacy Resident Pager: [phone] 06/04/2016

## 2016-06-04 NOTE — Progress Notes (Signed)
Pharmacy Antibiotic Note  Latoya [name] is a 70 y.o. female admitted on 06/04/2016 with sepsis.  Pharmacy has been consulted for vancomycin dosing. Patient is on zosyn, and antibiotic coverage was broadened due to unknown source of infection.   Patient has temperature of 101.17F, WBC of 15.9, and has SCr 0.68 mg/dL. Will plan to give patient a vancomycin loading dose now and continue at maintenance dose thereafter. Continue zosyn.   Plan: - Give vancomycin 1500mg  IV loading dose x1 - Start vancomycin 750mg  IV q12h, goal tough 15-20 mcg/mL - Continue zosyn 3.375gm IV q8h  - Monitor renal function, C/S, VT as indicated, and clinical progression   Height: 5' (152.4 cm) Weight: 165 lb 5.5 oz (75 kg) IBW/kg (Calculated) : 45.5  Temp (24hrs), Avg:102.7 F (39.3 C), Min:97.4 F (36.3 C), Max:104.7 F (40.4 C)   Recent Labs Lab 06/04/16 0045 06/04/16 0103 06/04/16 0332 06/04/16 0513 06/04/16 0520  WBC 15.9*  --   --   --   --   CREATININE 0.68  --   --   --  0.68  LATICACIDVEN  --  6.88* 6.06* 6.95*  --     Estimated Creatinine Clearance: 60 mL/min (by C-G formula based on SCr of 0.8 mg/dL).    Allergies  Allergen Reactions  . Hornet Venom Anaphylaxis  . Pork-Derived Products Anaphylaxis  . Chlorhexidine Itching    Reports that it is the dye in it  . Enoxaparin Sodium     REACTION: thrombocytopenia  . Heparin     REACTION: thrombocytopenia  . Lactose Intolerance (Gi)   . Other     Sensitive to dye in Betadine & Chlorohexadine   . Povidone-Iodine     Sensitivity- but if its wiped off she is able to tolerate betadine   . Indomethacin Rash  . Phenylpropanolamine Rash    Other Reaction: other reaction  . Povidone Iodine Rash    Antimicrobials this admission: Vancomycin 8/14 >>  Zosyn 8/14 >>  Dose adjustments this admission: N/A  Microbiology results: 8/14 BCx: sent 8/14 UCx: sent  8/14 MRSA PCR: sent   Thank you for allowing pharmacy to be a part of this  patient's care.  Norwood Levo [facility] 06/04/2016 11:12 AM

## 2016-06-04 NOTE — ED Notes (Signed)
Family at bedside. 

## 2016-06-04 NOTE — ED Notes (Signed)
Dr.Yao aware of lactic acid results

## 2016-06-04 NOTE — Progress Notes (Addendum)
Latoya [name] for Bivalirudin  Indication: atrial fibrillation   Allergies  Allergen Reactions  . Hornet Venom Anaphylaxis  . Pork-Derived Products Anaphylaxis  . Chlorhexidine Itching    Reports that it is the dye in it  . Enoxaparin Sodium     REACTION: thrombocytopenia  . Heparin     REACTION: thrombocytopenia  . Lactose Intolerance (Gi)   . Other     Sensitive to dye in Betadine & Chlorohexadine   . Povidone-Iodine     Sensitivity- but if its wiped off she is able to tolerate betadine   . Indomethacin Rash  . Phenylpropanolamine Rash    Other Reaction: other reaction  . Povidone Iodine Rash    Patient Measurements: Height: 5' (152.4 cm) Weight: 165 lb 5.5 oz (75 kg) IBW/kg (Calculated) : 45.5  Vital Signs: Temp: 101.3 F (38.5 C) (08/14 1115) Temp Source: Core (Comment) (08/14 0800) BP: 91/52 (08/14 1115) Pulse Rate: 96 (08/14 1115)  Labs:  Recent Labs  06/04/16 0045 06/04/16 0520  HGB 16.0*  --   HCT 47.9*  --   PLT 283  --   CREATININE 0.68 0.68  TROPONINI <0.03  --     Estimated Creatinine Clearance: 60 mL/min (by C-G formula based on SCr of 0.8 mg/dL).   Medications:  Prescriptions Prior to Admission  Medication Sig Dispense Refill Last Dose  . albuterol (PROVENTIL HFA;VENTOLIN HFA) 108 (90 BASE) MCG/ACT inhaler Inhale 2 puffs into the lungs every 6 (six) hours as needed for wheezing or shortness of breath.    06/03/2016 at Unknown time  . aspirin 81 MG EC tablet Take 81 mg by mouth daily.    06/03/2016 at Unknown time  . atorvastatin (LIPITOR) 40 MG tablet Take 40 mg by mouth daily.   06/03/2016 at Unknown time  . azelastine (ASTELIN) 137 MCG/SPRAY nasal spray Place 1 spray into the nose 2 (two) times daily. Use in each nostril as directed For allergies   06/03/2016 at Unknown time  . beclomethasone (QVAR) 80 MCG/ACT inhaler Inhale 2 puffs into the lungs 2 (two) times daily. (Patient taking differently: Inhale 2 puffs  into the lungs 3 (three) times daily. ) 1 Inhaler 11 06/03/2016 at Unknown time  . Calcium Carb-Cholecalciferol (CALCIUM 500 +D) 500-400 MG-UNIT TABS Take 1 tablet by mouth 2 (two) times daily.   Past Week at Unknown time  . cyclobenzaprine (FLEXERIL) 10 MG tablet Take 10 mg by mouth 3 (three) times daily as needed. For severe muscle pain   06/03/2016 at Unknown time  . diazepam (VALIUM) 5 MG tablet Take 5 mg by mouth at bedtime as needed for anxiety or sedation.    06/03/2016 at Unknown time  . docusate sodium (COLACE) 100 MG capsule Take 200 mg by mouth 2 (two) times daily.    06/03/2016 at Unknown time  . EPINEPHrine (EPIPEN) 0.3 mg/0.3 mL DEVI Inject 0.3 mLs (0.3 mg total) into the muscle once. (Patient taking differently: Inject 0.3 mg into the muscle once as needed. ) 1 Device 2 unknown  . esomeprazole (NEXIUM) 40 MG capsule Take 40 mg by mouth 2 (two) times daily.    06/03/2016 at Unknown time  . FentaNYL 37.5 MCG/HR PT72 Place 1 patch onto the skin every 3 (three) days.   06/03/2016 at Unknown time  . Glucosamine-Chondroitin 750-600 MG TABS Take 1 tablet by mouth 2 (two) times daily.   Past Week at Unknown time  . HYDROmorphone (DILAUDID) 4 MG tablet Take 4-8  mg by mouth every 4 (four) hours as needed. For pain   06/03/2016 at Unknown time  . levothyroxine (SYNTHROID, LEVOTHROID) 75 MCG tablet Take 75 mcg by mouth daily before breakfast.    06/03/2016 at Unknown time  . lidocaine (LIDODERM) 5 % Place 1 patch onto the skin daily. Remove & Discard patch within 12 hours or as directed by MD   06/03/2016 at Unknown time  . Loratadine-Pseudoephedrine (CLARITIN-D 12 HOUR PO) Take 1 tablet by mouth 2 (two) times daily.    06/03/2016 at Unknown time  . Melatonin 2.5 MG CAPS Take 2.5-5 mg by mouth at bedtime.   Past Week at Unknown time  . metaxalone (SKELAXIN) 800 MG tablet Take 800 mg by mouth every 8 (eight) hours as needed. For muscle pain   Past Week at Unknown time  . metoprolol (LOPRESSOR) 50 MG tablet  Take 1 tablet (50 mg total) by mouth 2 (two) times daily. 180 tablet 3 06/03/2016 at 1300  . montelukast (SINGULAIR) 10 MG tablet Take 10 mg by mouth at bedtime.    06/03/2016 at Unknown time  . Multiple Vitamin (MULTIVITAMIN WITH MINERALS) TABS Take 1 tablet by mouth daily.   06/03/2016 at Unknown time  . NITROSTAT 0.4 MG SL tablet DISSOLVE 1 TABLET UNDER TONGUE AS NEEDED FOR CHEST PAIN,MAY REPEAT IN5 MINUTES FOR 2 DOSES. 25 tablet 0 unknown  . Respiratory Therapy Supplies (FLUTTER) DEVI Use as directed 1 each 0 06/03/2016  . rivaroxaban (XARELTO) 20 MG TABS tablet Take 1 tablet (20 mg total) by mouth daily with supper. 30 tablet 11 06/03/2016 at 1300  . triamcinolone (NASACORT) 55 MCG/ACT nasal inhaler Place 1-2 sprays into the nose 2 (two) times daily.    06/03/2016 at Unknown time  . atorvastatin (LIPITOR) 20 MG tablet Take 1 tablet (20 mg total) by mouth daily. (Patient not taking: Reported on 06/04/2016) 90 tablet 3 Not Taking at Unknown time   Scheduled:  . antiseptic oral rinse  7 mL Mouth Rinse QID  . budesonide (PULMICORT) nebulizer solution  0.25 mg Nebulization BID  . hydrocortisone sodium succinate  50 mg Intravenous Q6H  . iopamidol      . ipratropium-albuterol  3 mL Nebulization Q6H  . levothyroxine  75 mcg Oral QAC breakfast  . midazolam      . montelukast  10 mg Oral QHS  . pantoprazole sodium  40 mg Per Tube BID  . piperacillin-tazobactam (ZOSYN)  IV  3.375 g Intravenous Q8H  . vancomycin  1,500 mg Intravenous Once  . [START ON 06/05/2016] vancomycin  750 mg Intravenous Q12H   Infusions:  . sodium chloride    . fentaNYL infusion INTRAVENOUS 25 mcg/hr (06/04/16 1113)  . norepinephrine (LEVOPHED) Adult infusion 10 mcg/min (06/04/16 1128)  . propofol (DIPRIVAN) infusion Stopped (06/04/16 1007)   PRN: sodium chloride  Assessment: Patient is a 70 yo female presented 8/14 with sepsis of with presumed biliary origin. She was on rivaroxaban PTA for a fib with her last dose on  evening of 8/13. She also has h/o APS w/ h/o DVT/PE. She has documented history of HIT from both UFH and enoxaparin. LFTs and T bili were elevated on admission, therefore not using argatroban.  Her SCr is 0.68 mg/dL, hgb 16.0 g/dL, and pltc 283.   Patient is going to IR procedure today, so will plan to start bivalirudin once procedure is complete if no s/sx of bleeding complications noted.    Goal of Therapy:  Goal aPPT: 50-85s Monitor platelets  by anticoagulation protocol: Yes   Plan:  Bivalirudin 7.5 mg/hr IV starting after procedure.  Measure aPPT at 2 hours, then q4h until at goal for x2, then q24h thereafter.  Monitor CBC, renal function, and s/sx of bleeding.   Norwood Levo Shuda 06/04/2016,11:49 AM   ADDENDUM Procedure completed at bedside ~1600.  Will start bivalirudin at 0.1mg /kg/hr = 7.5mg /hr starting at 1900 tonight- RN aware. First APTT at 2100  Griselle Rufer D. Alonah Lineback, PharmD, BCPS Clinical Pharmacist Pager: [phone] 06/04/2016 5:34 PM

## 2016-06-04 NOTE — Progress Notes (Signed)
[facility] Progress Note Patient Name: Latoya [name] [facility] DOB: 06-16-1946 MRN: [id number]   Date of Service  06/04/2016  HPI/Events of Note  Bedside nurse requested direction regarding goals and parameters for titration of fentanyl drip rate.   eICU Interventions  Drip range entered as well as goal of pain relief.      Intervention Category Intermediate Interventions: Pain - evaluation and management  Tera Partridge 06/04/2016, 7:12 PM

## 2016-06-04 NOTE — ED Notes (Signed)
Dr. Gardner at bedside 

## 2016-06-04 NOTE — Consult Note (Signed)
Chief Complaint: Patient was seen in consultation today for percutaneous cholecystostomy drain placement Chief Complaint  Patient presents with  . Chest Pain   at the request of Dr Jennet Maduro  Referring Physician(s): Dr Jennet Maduro  Supervising Physician: Daryll Brod  Patient Status: Inpatient  History of Present Illness: Latoya [name] is a 70 y.o. female   Presented to ED 8/14 early am with abd pain Fever; chills Declined in ED Sepsis Intubated  Work up reveals leukocytosis CT: IMPRESSION: 1. No evidence of aortic dissection. No evidence of aneurysmal dilatation. Mild calcification along the distal abdominal aorta and its branches. 2. Diffuse gallbladder distention, with trace pericholecystic fluid, and dilatation of the common bile duct to 1.3 cm in diameter, concerning for distal obstruction. Intrahepatic biliary ductal dilatation noted. MRCP would be helpful for further evaluation, to assess for underlying mass. 3. Somewhat thick-walled appearance to the sigmoid colon. This could simply reflect intraluminal contents, though would correlate for any associated symptoms. 4. Patchy bibasilar airspace opacities may reflect pneumonia, or possibly scarring. 5. 7.1 cm right hepatic mass, with prominent peripheral vasculature, likely reflects a large benign hemangioma. This has increased from 4.3 cm in 2007. 6. Chronic rupture of the patient's right-sided breast implant, stable from 2007, with leakage into the right lateral breast, better characterized than in 2007. 7. Mild degenerative change along the lower thoracic spine  Request per Dr Nelda Marseille for bedside percutaneous cholecystostomy drain placement Dr Annamaria Boots has reviewed imaging and approves procedure INR pending CCM placing central line now  Past Medical History:  Diagnosis Date  . Anemia, iron deficiency   . Antiphospholipid syndrome (HCC)    with hypercoagulable state  . Asthma    extrinsic;  moderate, persistant, nml spirometry 2010, nml CXR 1/08  . Bladder troubles    REPORTS INFECTIONS ON OCCASION DUE TO URETHRA MEATUS STRICTURE AT BIRTH   . Blood dyscrasia    antiphospholipid disorder  . CAD (coronary artery disease)    50% mid LAD, 80% ostial D1 and moderate 80% mid circ by vath 2003  . Cancer ([place])    basal cell removed fr. L arm   . Complication of anesthesia    cardiac arrest- in OR, at age 13 (54)y.o. during Scalenotomy in her early 20's  . Coronary heart disease   . Drug allergy    heparin/lovenox  . DVT (deep venous thrombosis) ([place])   . Erosive gastritis 1994  . Family history of adverse reaction to anesthesia    some family members have had trouble waking up  . GERD (gastroesophageal reflux disease)   . H/O hiatal hernia   . HLD (hyperlipidemia)   . HTN (hypertension)    [name] at [facility], manages pt.  LOV 03/2014  . Hypothyroidism   . Liver spot    cyst- - no biopsy, but told that its benign   . PAF (paroxysmal atrial fibrillation) (HCC)    not on coumadin therapy  . Pulmonary embolism ([place])    related to back surgery with prior coumadin use, now off  . Spondylosis, lumbosacral    ARTHRITIS- OA     Past Surgical History:  Procedure Laterality Date  . ABDOMINAL HYSTERECTOMY     partial abdominal -1998  . BACK SURGERY     x13 cervical and lumbar thoracic spine surgery  . BREAST ENHANCEMENT SURGERY    . BREAST SURGERY     all benign cysts x3   . CARDIAC CATHETERIZATION     2005  .  cleft lip and palate repair     70 yo   . hysterectomy    . JOINT REPLACEMENT    . POSTERIOR CERVICAL FUSION/FORAMINOTOMY Right 08/31/2014   Procedure: Right cervical six-seven foraminotomy, Cervical five-Thoracic one fusion, Cervical six-seven lateral mass screws;  Surgeon: Erline Levine, MD;  Location: [facility];  Service: Neurosurgery;  Laterality: Right;  . spina bifida repair     70 yo   . TONSILLECTOMY    . TOTAL KNEE ARTHROPLASTY     left     Allergies: Hornet venom; Pork-derived products; Chlorhexidine; Enoxaparin sodium; Heparin; Lactose intolerance (gi); Other; Povidone-iodine; Indomethacin; Phenylpropanolamine; and Povidone iodine  Medications: Prior to Admission medications   Medication Sig Start Date End Date Taking? Authorizing Provider  albuterol (PROVENTIL HFA;VENTOLIN HFA) 108 (90 BASE) MCG/ACT inhaler Inhale 2 puffs into the lungs every 6 (six) hours as needed for wheezing or shortness of breath.    Yes Historical Provider, MD  aspirin 81 MG EC tablet Take 81 mg by mouth daily.    Yes Historical Provider, MD  atorvastatin (LIPITOR) 40 MG tablet Take 40 mg by mouth daily.   Yes Historical Provider, MD  azelastine (ASTELIN) 137 MCG/SPRAY nasal spray Place 1 spray into the nose 2 (two) times daily. Use in each nostril as directed For allergies   Yes Historical Provider, MD  beclomethasone (QVAR) 80 MCG/ACT inhaler Inhale 2 puffs into the lungs 2 (two) times daily. Patient taking differently: Inhale 2 puffs into the lungs 3 (three) times daily.  06/24/14  Yes Elsie Stain, MD  Calcium Carb-Cholecalciferol (CALCIUM 500 +D) 500-400 MG-UNIT TABS Take 1 tablet by mouth 2 (two) times daily.   Yes Historical Provider, MD  cyclobenzaprine (FLEXERIL) 10 MG tablet Take 10 mg by mouth 3 (three) times daily as needed. For severe muscle pain   Yes Historical Provider, MD  diazepam (VALIUM) 5 MG tablet Take 5 mg by mouth at bedtime as needed for anxiety or sedation.    Yes Historical Provider, MD  docusate sodium (COLACE) 100 MG capsule Take 200 mg by mouth 2 (two) times daily.    Yes Historical Provider, MD  EPINEPHrine (EPIPEN) 0.3 mg/0.3 mL DEVI Inject 0.3 mLs (0.3 mg total) into the muscle once. Patient taking differently: Inject 0.3 mg into the muscle once as needed.  04/12/13  Yes Christina P Rama, MD  esomeprazole (NEXIUM) 40 MG capsule Take 40 mg by mouth 2 (two) times daily.    Yes Historical Provider, MD  FentaNYL 37.5  MCG/HR PT72 Place 1 patch onto the skin every 3 (three) days.   Yes Historical Provider, MD  Glucosamine-Chondroitin 750-600 MG TABS Take 1 tablet by mouth 2 (two) times daily.   Yes Historical Provider, MD  HYDROmorphone (DILAUDID) 4 MG tablet Take 4-8 mg by mouth every 4 (four) hours as needed. For pain   Yes Historical Provider, MD  levothyroxine (SYNTHROID, LEVOTHROID) 75 MCG tablet Take 75 mcg by mouth daily before breakfast.    Yes Historical Provider, MD  lidocaine (LIDODERM) 5 % Place 1 patch onto the skin daily. Remove & Discard patch within 12 hours or as directed by MD   Yes Historical Provider, MD  Loratadine-Pseudoephedrine (CLARITIN-D 12 HOUR PO) Take 1 tablet by mouth 2 (two) times daily.    Yes Historical Provider, MD  Melatonin 2.5 MG CAPS Take 2.5-5 mg by mouth at bedtime.   Yes Historical Provider, MD  metaxalone (SKELAXIN) 800 MG tablet Take 800 mg by mouth every 8 (  eight) hours as needed. For muscle pain   Yes Historical Provider, MD  metoprolol (LOPRESSOR) 50 MG tablet Take 1 tablet (50 mg total) by mouth 2 (two) times daily. 11/09/15  Yes Burtis Junes, NP  montelukast (SINGULAIR) 10 MG tablet Take 10 mg by mouth at bedtime.    Yes Historical Provider, MD  Multiple Vitamin (MULTIVITAMIN WITH MINERALS) TABS Take 1 tablet by mouth daily.   Yes Historical Provider, MD  NITROSTAT 0.4 MG SL tablet DISSOLVE 1 TABLET UNDER TONGUE AS NEEDED FOR CHEST PAIN,MAY REPEAT IN5 MINUTES FOR 2 DOSES. 02/24/16  Yes Burnell Blanks, MD  Respiratory Therapy Supplies (FLUTTER) DEVI Use as directed 06/06/15  Yes Tanda Rockers, MD  rivaroxaban (XARELTO) 20 MG TABS tablet Take 1 tablet (20 mg total) by mouth daily with supper. 12/21/15  Yes Burnell Blanks, MD  triamcinolone (NASACORT) 55 MCG/ACT nasal inhaler Place 1-2 sprays into the nose 2 (two) times daily.    Yes Historical Provider, MD  atorvastatin (LIPITOR) 20 MG tablet Take 1 tablet (20 mg total) by mouth daily. Patient not taking:  Reported on 06/04/2016 11/09/15   Burtis Junes, NP     Family History  Problem Relation Age of Onset  . Heart disease Father     MI   . Heart disease Sister   .       Social History   Social History  . Marital status: Married    Spouse name: N/A  . Number of children: N/A  . Years of education: N/A   Social History Main Topics  . Smoking status: Never Smoker  . Smokeless tobacco: Never Used     Comment: no smoking   . Alcohol use No  . Drug use: No  . Sexual activity: Not Asked   Other Topics Concern  . None   Social History Narrative   Lives in [place] with husband; has had miscarriages but no children.    Disabled but exercises nearly every day (can only exericse in water - aerobics)   Takes opioids for pain relief; takes no herbal medications; has a heart healthy diet.      Review of Systems: A 12 point ROS discussed and pertinent positives are indicated in the HPI above.  All other systems are negative.  Review of Systems  Respiratory:       Vent    Vital Signs: BP (!) 91/52   Pulse 96   Temp (!) 101.3 F (38.5 C)   Resp (!) 30   Ht 5' (1.524 m)   Wt 165 lb 5.5 oz (75 kg)   SpO2 99%   BMI 32.29 kg/m   Physical Exam  Cardiovascular: Normal rate.   Pulmonary/Chest:  vent  Skin: Skin is warm.  Psychiatric:  Consent signed over phone with husband John  Nursing note and vitals reviewed.   Mallampati Score:  MD Evaluation Airway: Other (comments) Airway comments: vent Heart: WNL Abdomen: WNL Chest/ Lungs: WNL ASA  Classification: 3 Mallampati/Airway Score: Three  Imaging: Dg Chest 2 View  Result Date: 06/04/2016 CLINICAL DATA:  Mid chest pain with shortness of breath and chills tonight. EXAM: CHEST  2 VIEW COMPARISON:  12/30/2014 FINDINGS: Postoperative changes in the cervical and thoracolumbar spine. Thoracolumbar scoliosis. Shallow inspiration. Heart size is obscured by the abdominal contents but does not appear enlarged grossly. Linear  scarring in the right mid and lower lung appears similar to previous study. Elevation of the right hemidiaphragm. No focal consolidation or edema.  No blunting of costophrenic angles. No pneumothorax. IMPRESSION: No evidence of active pulmonary disease. Stable appearance of chronic changes since previous study. Electronically Signed   By: Lucienne Capers M.D.   On: 06/04/2016 00:32   Dg Chest Port 1 View  Result Date: 06/04/2016 CLINICAL DATA:  Initial evaluation status post intubation. EXAM: PORTABLE CHEST 1 VIEW COMPARISON:  Prior radiograph from 06/03/2016. FINDINGS: There has been interval placement of an endotracheal tube with tip position 2.3 cm above the carina. Enteric tube courses in the the abdomen. Cardiac and mediastinal silhouettes are stable. Persistent elevation of the right hemidiaphragm with associated right basilar atelectasis/ scarring. Probable left basilar atelectasis present as well. There is increased vascular congestion as compared to prior without frank pulmonary edema. No pneumothorax. Osseous structures unchanged.  Spinal fixation hardware noted. IMPRESSION: 1. Tip of the endotracheal to 2.3 cm above the carina. 2. Slightly increased vascular congestion as compared to prior without overt pulmonary edema. 3. Otherwise stable appearance of the chest. Electronically Signed   By: Jeannine Boga M.D.   On: 06/04/2016 06:23   Dg Abd Portable 1v  Result Date: 06/04/2016 CLINICAL DATA:  Check gastric catheter placement EXAM: PORTABLE ABDOMEN - 1 VIEW COMPARISON:  None. FINDINGS: Gastric catheter is now noted within the stomach. Significant previous orthopedic hardware is noted within the lumbosacral spine. Fractured screw is again noted extending into the ilium on the left stable from the previous exam. No acute bony abnormality is noted. No free air is seen. IMPRESSION: Gastric catheter within the stomach as described. Electronically Signed   By: Inez Catalina M.D.   On: 06/04/2016  08:29   Ct Angio Abd/pel W And/or Wo Contrast  Result Date: 06/04/2016 CLINICAL DATA:  Acute onset of generalized chest and abdominal pain. Initial encounter. EXAM: CTA ABDOMEN AND PELVIS wITHOUT AND WITH CONTRAST TECHNIQUE: Multidetector CT imaging of the abdomen and pelvis was performed using the standard protocol during bolus administration of intravenous contrast. Multiplanar reconstructed images and MIPs were obtained and reviewed to evaluate the vascular anatomy. CONTRAST:  100 mL of Isovue 370 IV contrast COMPARISON:  CT of the abdomen and pelvis from 11/19/2005 FINDINGS: Patchy bibasilar airspace opacities may reflect pneumonia, or possibly scarring. Bilateral breast implants are noted; there is chronic rupture of the right-sided breast implant, with leakage into the right lateral breast. The chronically ruptured appearance is stable from 2007. There is no evidence of aortic dissection. There is no evidence of aneurysmal dilatation. Mild calcification is seen along the distal abdominal aorta and its branches. The celiac trunk, superior mesenteric artery, bilateral renal arteries and inferior mesenteric artery appear patent. The inferior vena cava is grossly unremarkable in appearance. Note is made of a retroaortic left renal vein. A 7.1 cm hypodense mass is noted at the right hepatic lobe, with prominent peripheral vasculature, likely reflecting a large hemangioma. The spleen is unremarkable in appearance. The gallbladder is diffusely distended, with trace pericholecystic fluid, and dilatation of the common bile duct to 1.3 cm in diameter, concerning for distal obstruction. Prominent intrahepatic biliary ducts are noted. The pancreas and adrenal glands are grossly unremarkable in appearance. The kidneys are unremarkable in appearance. There is no evidence of hydronephrosis. No renal or ureteral stones are seen. No perinephric stranding is appreciated. No free fluid is identified. The small bowel is  unremarkable in appearance. The stomach is within normal limits. No acute vascular abnormalities are seen. The appendix is not definitely characterized; there is no evidence of appendicitis. The sigmoid  colon is somewhat thick walled, though this would could reflect intraluminal contents. Would correlate for any associated symptoms. The bladder is mildly distended and grossly unremarkable. The patient is status post hysterectomy. No suspicious adnexal masses are seen. No inguinal lymphadenopathy is seen. No acute osseous abnormalities are identified. Thoracolumbar spinal fusion hardware is noted, with underlying decompression at the lower lumbar spine. Multilevel vacuum phenomenon is noted along the lower thoracic spine, with associated diffuse sclerosis. Review of the MIP images confirms the above findings. IMPRESSION: 1. No evidence of aortic dissection. No evidence of aneurysmal dilatation. Mild calcification along the distal abdominal aorta and its branches. 2. Diffuse gallbladder distention, with trace pericholecystic fluid, and dilatation of the common bile duct to 1.3 cm in diameter, concerning for distal obstruction. Intrahepatic biliary ductal dilatation noted. MRCP would be helpful for further evaluation, to assess for underlying mass. 3. Somewhat thick-walled appearance to the sigmoid colon. This could simply reflect intraluminal contents, though would correlate for any associated symptoms. 4. Patchy bibasilar airspace opacities may reflect pneumonia, or possibly scarring. 5. 7.1 cm right hepatic mass, with prominent peripheral vasculature, likely reflects a large benign hemangioma. This has increased from 4.3 cm in 2007. 6. Chronic rupture of the patient's right-sided breast implant, stable from 2007, with leakage into the right lateral breast, better characterized than in 2007. 7. Mild degenerative change along the lower thoracic spine. Electronically Signed   By: Garald Balding M.D.   On: 06/04/2016  03:26   US Abdomen Limited Ruq  Result Date: 06/04/2016 CLINICAL DATA:  Initial evaluation for EXAM: US ABDOMEN LIMITED - RIGHT UPPER QUADRANT COMPARISON:  Comparison made with concomitant CT of the abdomen and pelvis performed on the same day. FINDINGS: Gallbladder: No gallstones or wall thickening visualized. No sonographic Murphy sign noted by sonographer. Small amount of free pericholecystic fluid noted. Common bile duct: Diameter: 7 mm.  Felt to be within normal limits for age. Liver: Complex mass measuring 7.9 x 7.8 x 8.1 cm. This is better evaluated on concomitant CT. IMPRESSION: 1. Small amount of free pericholecystic fluid without other sonographic features to suggest acute cholecystitis. This may be related to overall volume status. No cholelithiasis or biliary dilatation. 2. 7.9 x 7.8 x 8.1 cm complex hepatic mass, better evaluated on concomitant CT the abdomen and pelvis. Electronically Signed   By: Jeannine Boga M.D.   On: 06/04/2016 03:03    Labs:  CBC:  Recent Labs  06/04/16 0045  WBC 15.9*  HGB 16.0*  HCT 47.9*  PLT 283    COAGS: No results for input(s): INR, APTT in the last 8760 hours.  BMP:  Recent Labs  06/04/16 0045 06/04/16 0520  NA 134* 139  K 4.0 3.2*  CL 98* 105  CO2 21* 22  GLUCOSE 171* 159*  BUN 14 13  CALCIUM 9.6 8.9  CREATININE 0.68 0.68  GFRNONAA >60 >60  GFRAA >60 >60    LIVER FUNCTION TESTS:  Recent Labs  06/04/16 0045 06/04/16 0520  BILITOT 2.7* 2.5*  AST 500* 752*  ALT 291* 486*  ALKPHOS 138* 129*  PROT 7.6 5.7*  ALBUMIN 4.5 3.4*    TUMOR MARKERS: No results for input(s): AFPTM, CEA, CA199, CHROMGRNA in the last 8760 hours.  Assessment and Plan:  Sepsis Cholecystitis Scheduled for bedside per Dr Annamaria Boots Risks and Benefits discussed with the patient's husband including, but not limited to bleeding, infection, gallbladder perforation, bile leak, sepsis or even death. All of his questions were answered, he is  agreeable to proceed. Consent signed and in chart.   Thank you for this interesting consult.  I greatly enjoyed meeting Lilo Seeney [facility] and look forward to participating in their care.  A copy of this report was sent to the requesting provider on this date.  Electronically Signed: Ismeal Heider A 06/04/2016, 11:34 AM   I spent a total of 40 Minutes    in face to face in clinical consultation, greater than 50% of which was counseling/coordinating care for percutaneous cholecystomy drain

## 2016-06-04 NOTE — ED Notes (Signed)
Pt lying in fetal position in the bed. Reports multiple back surgeries and unable to lie on back. Pt c/o chest and abdominal pain. Pt confused on assessment

## 2016-06-04 NOTE — Procedures (Signed)
S/P [place] PERC CHOLECYSTOSTOMY NO COMP STABLE 30CC BILE ASPIRATED CX SENT FULL REPORT IN PACS

## 2016-06-04 NOTE — H&P (Signed)
PULMONARY / CRITICAL CARE MEDICINE   Name: Latoya [name] MRN: [id number] DOB: 1946-05-24    ADMISSION DATE:  06/04/2016  CHIEF COMPLAINT:  Abdominal Pain  HISTORY OF PRESENT ILLNESS:   [name] is a 70 y/o woman with MMP, most notable for antiphospholipid syndrome on AC (hx of one provoked DVT), as well as CAD and asthma who presented to the ED with sudden onset of chills and abdominal pain. In the ED her clinical status declined, becoming less responsive, more tachycardic and more tachypneic. Imaging studies suggested an enlarged gallbladder.  PAST MEDICAL HISTORY :  She  has a past medical history of Anemia, iron deficiency; Antiphospholipid syndrome ([place]); Asthma; Bladder troubles; Blood dyscrasia; CAD (coronary artery disease); Cancer ([place]); Complication of anesthesia; Coronary heart disease; Drug allergy; DVT (deep venous thrombosis) ([place]); Erosive gastritis (1994); Family history of adverse reaction to anesthesia; GERD (gastroesophageal reflux disease); H/O hiatal hernia; HLD (hyperlipidemia); HTN (hypertension); Hypothyroidism; Liver spot; PAF (paroxysmal atrial fibrillation) ([place]); Pulmonary embolism ([facility]); and Spondylosis, lumbosacral.  PAST SURGICAL HISTORY: She  has a past surgical history that includes Total knee arthroplasty; cleft lip and palate repair; hysterectomy; spina bifida repair; Cardiac catheterization; Joint replacement; Tonsillectomy; Breast surgery; Abdominal hysterectomy; Back surgery; Breast enhancement surgery; and Posterior cervical fusion/foraminotomy (Right, 08/31/2014).  Allergies  Allergen Reactions  . Hornet Venom Anaphylaxis  . Pork-Derived Products Anaphylaxis  . Chlorhexidine Itching    Reports that it is the dye in it  . Enoxaparin Sodium     REACTION: thrombocytopenia  . Heparin     REACTION: thrombocytopenia  . Lactose Intolerance (Gi)   . Other     Sensitive to dye in Betadine & Chlorohexadine   . Povidone-Iodine     Sensitivity- but if its  wiped off she is able to tolerate betadine   . Indomethacin Rash  . Phenylpropanolamine Rash    Other Reaction: other reaction  . Povidone Iodine Rash    No current facility-administered medications on file prior to encounter.    Current Outpatient Prescriptions on File Prior to Encounter  Medication Sig  . albuterol (PROVENTIL HFA;VENTOLIN HFA) 108 (90 BASE) MCG/ACT inhaler Inhale 2 puffs into the lungs every 6 (six) hours as needed for wheezing or shortness of breath.   Marland Kitchen aspirin 81 MG EC tablet Take 81 mg by mouth daily.   Marland Kitchen azelastine (ASTELIN) 137 MCG/SPRAY nasal spray Place 1 spray into the nose 2 (two) times daily. Use in each nostril as directed For allergies  . beclomethasone (QVAR) 80 MCG/ACT inhaler Inhale 2 puffs into the lungs 2 (two) times daily. (Patient taking differently: Inhale 2 puffs into the lungs 3 (three) times daily. )  . cyclobenzaprine (FLEXERIL) 10 MG tablet Take 10 mg by mouth 3 (three) times daily as needed. For severe muscle pain  . diazepam (VALIUM) 5 MG tablet Take 5 mg by mouth at bedtime as needed for anxiety or sedation.   . docusate sodium (COLACE) 100 MG capsule Take 200 mg by mouth 2 (two) times daily.   Marland Kitchen EPINEPHrine (EPIPEN) 0.3 mg/0.3 mL DEVI Inject 0.3 mLs (0.3 mg total) into the muscle once. (Patient taking differently: Inject 0.3 mg into the muscle once as needed. )  . esomeprazole (NEXIUM) 40 MG capsule Take 40 mg by mouth 2 (two) times daily.   Marland Kitchen HYDROmorphone (DILAUDID) 4 MG tablet Take 4-8 mg by mouth every 4 (four) hours as needed. For pain  . levothyroxine (SYNTHROID, LEVOTHROID) 75 MCG tablet Take 75 mcg by mouth  daily before breakfast.   . Loratadine-Pseudoephedrine (CLARITIN-D 12 HOUR PO) Take 1 tablet by mouth 2 (two) times daily.   . Melatonin 2.5 MG CAPS Take 2.5-5 mg by mouth at bedtime.  . metaxalone (SKELAXIN) 800 MG tablet Take 800 mg by mouth every 8 (eight) hours as needed. For muscle pain  . metoprolol (LOPRESSOR) 50 MG tablet  Take 1 tablet (50 mg total) by mouth 2 (two) times daily.  . montelukast (SINGULAIR) 10 MG tablet Take 10 mg by mouth at bedtime.   . Multiple Vitamin (MULTIVITAMIN WITH MINERALS) TABS Take 1 tablet by mouth daily.  Marland Kitchen NITROSTAT 0.4 MG SL tablet DISSOLVE 1 TABLET UNDER TONGUE AS NEEDED FOR CHEST PAIN,MAY REPEAT IN5 MINUTES FOR 2 DOSES.  Marland Kitchen Respiratory Therapy Supplies (FLUTTER) DEVI Use as directed  . rivaroxaban (XARELTO) 20 MG TABS tablet Take 1 tablet (20 mg total) by mouth daily with supper.  . triamcinolone (NASACORT) 55 MCG/ACT nasal inhaler Place 1-2 sprays into the nose 2 (two) times daily.   Marland Kitchen atorvastatin (LIPITOR) 20 MG tablet Take 1 tablet (20 mg total) by mouth daily. (Patient not taking: Reported on 06/04/2016)    FAMILY HISTORY:  No known family history of liver disease.  SOCIAL HISTORY: She  reports that she has never smoked. She has never used smokeless tobacco. She reports that she does not drink alcohol or use drugs.  REVIEW OF SYSTEMS:   Unable to obtain 2/2 AMS  SUBJECTIVE:  70 y/o woman with severe sepsis, apparently of biliary origin.  VITAL SIGNS: BP 159/78   Pulse (!) 124   Temp 101.7 F (38.7 C) (Oral)   Resp (!) 31   Ht _0  (1.575 m)   Wt 160 lb (72.6 kg)   SpO2 96%   BMI 29.26 kg/m   HEMODYNAMICS:    VENTILATOR SETTINGS:    INTAKE / OUTPUT: No intake/output data recorded.  PHYSICAL EXAMINATION: General:  Elderly woman in moderate respiratory distress on BiPAP, unresponsive. Neuro:  Awake, makes eye contact to voice, but does not follow commands. HEENT:  BiPAP mask in place, but no icteric sclerae. Cardiovascular:  Tachycardic, but HR normal Lungs:  Normal breath sounds Abdomen:  Obese, TTP Musculoskeletal:  No joint swelling Skin:  No visible rashes  LABS:  BMET  Recent Labs Lab 06/04/16 0045  NA 134*  K 4.0  CL 98*  CO2 21*  BUN 14  CREATININE 0.68  GLUCOSE 171*    Electrolytes  Recent Labs Lab 06/04/16 0045   CALCIUM 9.6    CBC  Recent Labs Lab 06/04/16 0045  WBC 15.9*  HGB 16.0*  HCT 47.9*  PLT 283    Coag's No results for input(s): APTT, INR in the last 168 hours.  Sepsis Markers  Recent Labs Lab 06/04/16 0103 06/04/16 0332 06/04/16 0513  LATICACIDVEN 6.88* 6.06* 6.95*    ABG  Recent Labs Lab 06/04/16 0517  PHART 7.339*  PCO2ART 32.9*  PO2ART 72.0*    Liver Enzymes  Recent Labs Lab 06/04/16 0045  AST 500*  ALT 291*  ALKPHOS 138*  BILITOT 2.7*  ALBUMIN 4.5    Cardiac Enzymes  Recent Labs Lab 06/04/16 0045  TROPONINI <0.03    Glucose No results for input(s): GLUCAP in the last 168 hours.  Imaging Dg Chest 2 View  Result Date: 06/04/2016 CLINICAL DATA:  Mid chest pain with shortness of breath and chills tonight. EXAM: CHEST  2 VIEW COMPARISON:  12/30/2014 FINDINGS: Postoperative changes in the cervical and thoracolumbar  spine. Thoracolumbar scoliosis. Shallow inspiration. Heart size is obscured by the abdominal contents but does not appear enlarged grossly. Linear scarring in the right mid and lower lung appears similar to previous study. Elevation of the right hemidiaphragm. No focal consolidation or edema. No blunting of costophrenic angles. No pneumothorax. IMPRESSION: No evidence of active pulmonary disease. Stable appearance of chronic changes since previous study. Electronically Signed   By: Lucienne Capers M.D.   On: 06/04/2016 00:32   Ct Angio Abd/pel W And/or Wo Contrast  Result Date: 06/04/2016 CLINICAL DATA:  Acute onset of generalized chest and abdominal pain. Initial encounter. EXAM: CTA ABDOMEN AND PELVIS wITHOUT AND WITH CONTRAST TECHNIQUE: Multidetector CT imaging of the abdomen and pelvis was performed using the standard protocol during bolus administration of intravenous contrast. Multiplanar reconstructed images and MIPs were obtained and reviewed to evaluate the vascular anatomy. CONTRAST:  100 mL of Isovue 370 IV contrast  COMPARISON:  CT of the abdomen and pelvis from 11/19/2005 FINDINGS: Patchy bibasilar airspace opacities may reflect pneumonia, or possibly scarring. Bilateral breast implants are noted; there is chronic rupture of the right-sided breast implant, with leakage into the right lateral breast. The chronically ruptured appearance is stable from 2007. There is no evidence of aortic dissection. There is no evidence of aneurysmal dilatation. Mild calcification is seen along the distal abdominal aorta and its branches. The celiac trunk, superior mesenteric artery, bilateral renal arteries and inferior mesenteric artery appear patent. The inferior vena cava is grossly unremarkable in appearance. Note is made of a retroaortic left renal vein. A 7.1 cm hypodense mass is noted at the right hepatic lobe, with prominent peripheral vasculature, likely reflecting a large hemangioma. The spleen is unremarkable in appearance. The gallbladder is diffusely distended, with trace pericholecystic fluid, and dilatation of the common bile duct to 1.3 cm in diameter, concerning for distal obstruction. Prominent intrahepatic biliary ducts are noted. The pancreas and adrenal glands are grossly unremarkable in appearance. The kidneys are unremarkable in appearance. There is no evidence of hydronephrosis. No renal or ureteral stones are seen. No perinephric stranding is appreciated. No free fluid is identified. The small bowel is unremarkable in appearance. The stomach is within normal limits. No acute vascular abnormalities are seen. The appendix is not definitely characterized; there is no evidence of appendicitis. The sigmoid colon is somewhat thick walled, though this would could reflect intraluminal contents. Would correlate for any associated symptoms. The bladder is mildly distended and grossly unremarkable. The patient is status post hysterectomy. No suspicious adnexal masses are seen. No inguinal lymphadenopathy is seen. No acute osseous  abnormalities are identified. Thoracolumbar spinal fusion hardware is noted, with underlying decompression at the lower lumbar spine. Multilevel vacuum phenomenon is noted along the lower thoracic spine, with associated diffuse sclerosis. Review of the MIP images confirms the above findings. IMPRESSION: 1. No evidence of aortic dissection. No evidence of aneurysmal dilatation. Mild calcification along the distal abdominal aorta and its branches. 2. Diffuse gallbladder distention, with trace pericholecystic fluid, and dilatation of the common bile duct to 1.3 cm in diameter, concerning for distal obstruction. Intrahepatic biliary ductal dilatation noted. MRCP would be helpful for further evaluation, to assess for underlying mass. 3. Somewhat thick-walled appearance to the sigmoid colon. This could simply reflect intraluminal contents, though would correlate for any associated symptoms. 4. Patchy bibasilar airspace opacities may reflect pneumonia, or possibly scarring. 5. 7.1 cm right hepatic mass, with prominent peripheral vasculature, likely reflects a large benign hemangioma. This has increased from  4.3 cm in 2007. 6. Chronic rupture of the patient's right-sided breast implant, stable from 2007, with leakage into the right lateral breast, better characterized than in 2007. 7. Mild degenerative change along the lower thoracic spine. Electronically Signed   By: Garald Balding M.D.   On: 06/04/2016 03:26   US Abdomen Limited Ruq  Result Date: 06/04/2016 CLINICAL DATA:  Initial evaluation for EXAM: US ABDOMEN LIMITED - RIGHT UPPER QUADRANT COMPARISON:  Comparison made with concomitant CT of the abdomen and pelvis performed on the same day. FINDINGS: Gallbladder: No gallstones or wall thickening visualized. No sonographic Murphy sign noted by sonographer. Small amount of free pericholecystic fluid noted. Common bile duct: Diameter: 7 mm.  Felt to be within normal limits for age. Liver: Complex mass measuring 7.9 x  7.8 x 8.1 cm. This is better evaluated on concomitant CT. IMPRESSION: 1. Small amount of free pericholecystic fluid without other sonographic features to suggest acute cholecystitis. This may be related to overall volume status. No cholelithiasis or biliary dilatation. 2. 7.9 x 7.8 x 8.1 cm complex hepatic mass, better evaluated on concomitant CT the abdomen and pelvis. Electronically Signed   By: Jeannine Boga M.D.   On: 06/04/2016 03:03     STUDIES:  CTA ABD RUQ [place]   CULTURES: Blood 8/14 >> Urine 8/14 >>  ANTIBIOTICS: Vanc 8/14 >> Zosyn 8/14 >>  SIGNIFICANT EVENTS: Intubated in ED after initial evaluation  LINES/TUBES: ETT 7.5 mm 8/14 >> Foley 8/14 >> (being inserted at time of documentation)  DISCUSSION: 70 y/o woman with severe sepsis of presumed biliary origin  ASSESSMENT / PLAN:  PULMONARY A: Need for Mechanical Ventilation Moderate persistent asthma P:   Maintain PRVC for now, high MV due to metabolic acidosis  Nebulized budesonide, ipratropium, and albuterol for asthma.  CARDIOVASCULAR A:  CAD HTN PAF P:  Old DOAC for now.  RENAL A:   Lactic Acidosis P:   Due to sepsis, possible liver disease  GASTROINTESTINAL A:   Abnormal LFTs Probable cholangitis P:   Pattern of LFT abnormality not classic with transaminases elevated out of proportion to alk phos and bilirubin. High lactic acid without shock physiology may indicate hepatic dysfunction.   HEMATOLOGIC A:   Hx of Antiphospholipid Ab Syndrome Hx of provoked DVT On rivaroxaban Hx of allergy to Heparin products P:  Hold rivaroxaban in anticipation of possible PTC  INFECTIOUS A:   Severe sepsis, probable biliary origin P:   Given clinic onset of rapid onset severe sepsis, radiographic findings as well as elevated bili, transaminases, and alk phos, suspect cholangitis. Day team to call IR in AM to discuss PTC  ENDOCRINE A:   Hypothyroidism P:   Cont home syndroid  NEUROLOGIC A:    Need for sedation P:   Propofol RASS goal: 0   FAMILY  - Updates: Updated on admission  - Inter-disciplinary family meet or Palliative Care meeting due by:  8/21  CRITICAL CARE Performed by: Luz Brazen   Total critical care time: 90 minutes  Critical care time was exclusive of separately billable procedures and treating other patients.  Critical care was necessary to treat or prevent imminent or life-threatening deterioration.  Critical care was time spent personally by me on the following activities: development of treatment plan with patient and/or surrogate as well as nursing, discussions with consultants, evaluation of patient's response to treatment, examination of patient, obtaining history from patient or surrogate, ordering and performing treatments and interventions, ordering and review of laboratory studies, ordering  and review of radiographic studies, pulse oximetry and re-evaluation of patient's condition.   Pulmonary and [place] Pager: [phone]  06/04/2016, 5:41 AM

## 2016-06-04 NOTE — Progress Notes (Signed)
[facility] for Bivalirudin  Indication: atrial fibrillation   Allergies  Allergen Reactions  . Hornet Venom Anaphylaxis  . Pork-Derived Products Anaphylaxis  . Chlorhexidine Itching    Reports that it is the dye in it  . Enoxaparin Sodium     REACTION: thrombocytopenia  . Heparin     REACTION: thrombocytopenia  . Lactose Intolerance (Gi)   . Other     Sensitive to dye in Betadine & Chlorohexadine   . Povidone-Iodine     Sensitivity- but if its wiped off she is able to tolerate betadine   . Indomethacin Rash  . Phenylpropanolamine Rash    Other Reaction: other reaction  . Povidone Iodine Rash    Patient Measurements: Height: 5' (152.4 cm) Weight: 165 lb 5.5 oz (75 kg) IBW/kg (Calculated) : 45.5  Vital Signs: Temp: 101.8 F (38.8 C) (08/14 2100) Temp Source: Core (Comment) (08/14 2000) BP: 106/67 (08/14 2100) Pulse Rate: 132 (08/14 2100)  Labs:  Recent Labs  06/04/16 0045 06/04/16 0520 06/04/16 1314 06/04/16 2047  HGB 16.0*  --   --   --   HCT 47.9*  --   --   --   PLT 283  --   --   --   APTT  --   --   --  69*  LABPROT  --   --  19.0*  --   INR  --   --  1.58  --   CREATININE 0.68 0.68  --   --   TROPONINI <0.03  --   --   --     Estimated Creatinine Clearance: 60 mL/min (by C-G formula based on SCr of 0.8 mg/dL).   Assessment: Patient is a 70 yo female presented 8/14 with sepsis of with presumed biliary origin. She was on rivaroxaban PTA for AFib with last dose 8/13PM. She also has h/o APS w/ h/o DVT/PE. She has documented history of HIT from both UFH and enoxaparin. LFTs and T bili were elevated on admission, therefore not using argatroban.  She was started on bivalirudin after [place] perc chole was completed. First aPTT in range at 69 seconds.  Goal of Therapy:  Goal aPPT: 50-85 seconds Monitor platelets by anticoagulation protocol: Yes   Plan:  -Continue Bivalirudin 7.5 mg/h -Repeat aPTT at 0100- if  therapeutic, can then check daily aPTT.  -Monitor CBC, renal function, and s/sx of bleeding.   Jani Ploeger D. Bexton Haak, PharmD, BCPS Clinical Pharmacist Pager: [phone] 06/04/2016 9:17 PM

## 2016-06-04 NOTE — ED Notes (Signed)
Pt becoming more lethargic and less responsive

## 2016-06-04 NOTE — Progress Notes (Signed)
PULMONARY / CRITICAL CARE MEDICINE   Name: EMILIA KAYES MRN: [id number] DOB: [birth year]    ADMISSION DATE:  06/04/2016  CHIEF COMPLAINT:  Abdominal Pain  HISTORY OF PRESENT ILLNESS:   Latoya [name] is a 70 y/o woman with MMP, most notable for antiphospholipid syndrome on AC (hx of one provoked DVT), as well as CAD and asthma who presented to the ED with sudden onset of chills and abdominal pain. In the ED her clinical status declined, becoming less responsive, more tachycardic and more tachypneic. Imaging studies suggested an enlarged gallbladder.  SUBJECTIVE:  Deteriorating BP and O2 demand overnight.  VITAL SIGNS: BP (!) 78/51   Pulse (!) 105   Temp (!) 102.2 F (39 C)   Resp (!) 30   Ht 5' (1.524 m)   Wt 75 kg (165 lb 5.5 oz)   SpO2 95%   BMI 32.29 kg/m    HEMODYNAMICS:    VENTILATOR SETTINGS: Vent Mode: PRVC FiO2 (%):  [40 %-100 %] 60 % Set Rate:  [30 bmp] 30 bmp Vt Set:  [400 mL] 400 mL PEEP:  [5 cmH20] 5 cmH20 Plateau Pressure:  [17 cmH20] 17 cmH20  INTAKE / OUTPUT: I/O last 3 completed shifts: In: 2001.8 [I.V.:2001.8] Out: 400 [Emesis/NG output:400]  PHYSICAL EXAMINATION: General:  Elderly woman, chronically ill appearing, unresponsive on vent. Neuro:  Unresponsive, does not withdraw to pain but currently very hypotensive. HEENT:  ETT, /AT, PERRL, EOM-spontaneous Cardiovascular:  Tachycardic, but HR normal Lungs:  Normal breath sounds Abdomen:  Obese, NT, ND and +BS, unresponsive. Musculoskeletal:  No joint swelling Skin:  No visible rashes  LABS:  BMET  Recent Labs Lab 06/04/16 0045 06/04/16 0520  NA 134* 139  K 4.0 3.2*  CL 98* 105  CO2 21* 22  BUN 14 13  CREATININE 0.68 0.68  GLUCOSE 171* 159*   Electrolytes  Recent Labs Lab 06/04/16 0045 06/04/16 0520  CALCIUM 9.6 8.9   CBC  Recent Labs Lab 06/04/16 0045  WBC 15.9*  HGB 16.0*  HCT 47.9*  PLT 283   Coag's No results for input(s): APTT, INR in the last 168  hours.  Sepsis Markers  Recent Labs Lab 06/04/16 0103 06/04/16 0332 06/04/16 0513  LATICACIDVEN 6.88* 6.06* 6.95*   ABG  Recent Labs Lab 06/04/16 0517 06/04/16 0830  PHART 7.339* 7.294*  PCO2ART 32.9* 37.6  PO2ART 72.0* 226*   Liver Enzymes  Recent Labs Lab 06/04/16 0045 06/04/16 0520  AST 500* 752*  ALT 291* 486*  ALKPHOS 138* 129*  BILITOT 2.7* 2.5*  ALBUMIN 4.5 3.4*   Cardiac Enzymes  Recent Labs Lab 06/04/16 0045  TROPONINI <0.03   Glucose  Recent Labs Lab 06/04/16 0656  GLUCAP 138*   Imaging Dg Chest 2 View  Result Date: 06/04/2016 CLINICAL DATA:  Mid chest pain with shortness of breath and chills tonight. EXAM: CHEST  2 VIEW COMPARISON:  12/30/2014 FINDINGS: Postoperative changes in the cervical and thoracolumbar spine. Thoracolumbar scoliosis. Shallow inspiration. Heart size is obscured by the abdominal contents but does not appear enlarged grossly. Linear scarring in the right mid and lower lung appears similar to previous study. Elevation of the right hemidiaphragm. No focal consolidation or edema. No blunting of costophrenic angles. No pneumothorax. IMPRESSION: No evidence of active pulmonary disease. Stable appearance of chronic changes since previous study. Electronically Signed   By: Lucienne Capers M.D.   On: 06/04/2016 00:32   Dg Chest Port 1 View  Result Date: 06/04/2016 CLINICAL DATA:  Initial evaluation  status post intubation. EXAM: PORTABLE CHEST 1 VIEW COMPARISON:  Prior radiograph from 06/03/2016. FINDINGS: There has been interval placement of an endotracheal tube with tip position 2.3 cm above the carina. Enteric tube courses in the the abdomen. Cardiac and mediastinal silhouettes are stable. Persistent elevation of the right hemidiaphragm with associated right basilar atelectasis/ scarring. Probable left basilar atelectasis present as well. There is increased vascular congestion as compared to prior without frank pulmonary edema. No  pneumothorax. Osseous structures unchanged.  Spinal fixation hardware noted. IMPRESSION: 1. Tip of the endotracheal to 2.3 cm above the carina. 2. Slightly increased vascular congestion as compared to prior without overt pulmonary edema. 3. Otherwise stable appearance of the chest. Electronically Signed   By: Jeannine Boga M.D.   On: 06/04/2016 06:23   Dg Abd Portable 1v  Result Date: 06/04/2016 CLINICAL DATA:  Check gastric catheter placement EXAM: PORTABLE ABDOMEN - 1 VIEW COMPARISON:  None. FINDINGS: Gastric catheter is now noted within the stomach. Significant previous orthopedic hardware is noted within the lumbosacral spine. Fractured screw is again noted extending into the ilium on the left stable from the previous exam. No acute bony abnormality is noted. No free air is seen. IMPRESSION: Gastric catheter within the stomach as described. Electronically Signed   By: Inez Catalina M.D.   On: 06/04/2016 08:29   Ct Angio Abd/pel W And/or Wo Contrast  Result Date: 06/04/2016 CLINICAL DATA:  Acute onset of generalized chest and abdominal pain. Initial encounter. EXAM: CTA ABDOMEN AND PELVIS wITHOUT AND WITH CONTRAST TECHNIQUE: Multidetector CT imaging of the abdomen and pelvis was performed using the standard protocol during bolus administration of intravenous contrast. Multiplanar reconstructed images and MIPs were obtained and reviewed to evaluate the vascular anatomy. CONTRAST:  100 mL of Isovue 370 IV contrast COMPARISON:  CT of the abdomen and pelvis from 11/19/2005 FINDINGS: Patchy bibasilar airspace opacities may reflect pneumonia, or possibly scarring. Bilateral breast implants are noted; there is chronic rupture of the right-sided breast implant, with leakage into the right lateral breast. The chronically ruptured appearance is stable from 2007. There is no evidence of aortic dissection. There is no evidence of aneurysmal dilatation. Mild calcification is seen along the distal abdominal aorta  and its branches. The celiac trunk, superior mesenteric artery, bilateral renal arteries and inferior mesenteric artery appear patent. The inferior vena cava is grossly unremarkable in appearance. Note is made of a retroaortic left renal vein. A 7.1 cm hypodense mass is noted at the right hepatic lobe, with prominent peripheral vasculature, likely reflecting a large hemangioma. The spleen is unremarkable in appearance. The gallbladder is diffusely distended, with trace pericholecystic fluid, and dilatation of the common bile duct to 1.3 cm in diameter, concerning for distal obstruction. Prominent intrahepatic biliary ducts are noted. The pancreas and adrenal glands are grossly unremarkable in appearance. The kidneys are unremarkable in appearance. There is no evidence of hydronephrosis. No renal or ureteral stones are seen. No perinephric stranding is appreciated. No free fluid is identified. The small bowel is unremarkable in appearance. The stomach is within normal limits. No acute vascular abnormalities are seen. The appendix is not definitely characterized; there is no evidence of appendicitis. The sigmoid colon is somewhat thick walled, though this would could reflect intraluminal contents. Would correlate for any associated symptoms. The bladder is mildly distended and grossly unremarkable. The patient is status post hysterectomy. No suspicious adnexal masses are seen. No inguinal lymphadenopathy is seen. No acute osseous abnormalities are identified. Thoracolumbar spinal fusion  hardware is noted, with underlying decompression at the lower lumbar spine. Multilevel vacuum phenomenon is noted along the lower thoracic spine, with associated diffuse sclerosis. Review of the MIP images confirms the above findings. IMPRESSION: 1. No evidence of aortic dissection. No evidence of aneurysmal dilatation. Mild calcification along the distal abdominal aorta and its branches. 2. Diffuse gallbladder distention, with trace  pericholecystic fluid, and dilatation of the common bile duct to 1.3 cm in diameter, concerning for distal obstruction. Intrahepatic biliary ductal dilatation noted. MRCP would be helpful for further evaluation, to assess for underlying mass. 3. Somewhat thick-walled appearance to the sigmoid colon. This could simply reflect intraluminal contents, though would correlate for any associated symptoms. 4. Patchy bibasilar airspace opacities may reflect pneumonia, or possibly scarring. 5. 7.1 cm right hepatic mass, with prominent peripheral vasculature, likely reflects a large benign hemangioma. This has increased from 4.3 cm in 2007. 6. Chronic rupture of the patient's right-sided breast implant, stable from 2007, with leakage into the right lateral breast, better characterized than in 2007. 7. Mild degenerative change along the lower thoracic spine. Electronically Signed   By: Garald Balding M.D.   On: 06/04/2016 03:26   US Abdomen Limited Ruq  Result Date: 06/04/2016 CLINICAL DATA:  Initial evaluation for EXAM: US ABDOMEN LIMITED - RIGHT UPPER QUADRANT COMPARISON:  Comparison made with concomitant CT of the abdomen and pelvis performed on the same day. FINDINGS: Gallbladder: No gallstones or wall thickening visualized. No sonographic Murphy sign noted by sonographer. Small amount of free pericholecystic fluid noted. Common bile duct: Diameter: 7 mm.  Felt to be within normal limits for age. Liver: Complex mass measuring 7.9 x 7.8 x 8.1 cm. This is better evaluated on concomitant CT. IMPRESSION: 1. Small amount of free pericholecystic fluid without other sonographic features to suggest acute cholecystitis. This may be related to overall volume status. No cholelithiasis or biliary dilatation. 2. 7.9 x 7.8 x 8.1 cm complex hepatic mass, better evaluated on concomitant CT the abdomen and pelvis. Electronically Signed   By: Jeannine Boga M.D.   On: 06/04/2016 03:03   STUDIES:  CTA ABD with enlarged galbladder  and enlarging hepatic cyst. RUQ [place] noted  CULTURES: Blood 8/14 >> Urine 8/14 >>  ANTIBIOTICS: Vanc 8/14 >> Zosyn 8/14 >>  SIGNIFICANT EVENTS: Intubated in ED after initial evaluation  LINES/TUBES: ETT 7.5 mm 8/14 >> Foley 8/14 >> (being inserted at time of documentation) L IJ TLC 8/14>>>  DISCUSSION: 70 y/o woman with severe sepsis of presumed biliary origin  ASSESSMENT / PLAN:  PULMONARY A: Need for Mechanical Ventilation Moderate persistent asthma P:   Maintain high minute ventilation Nebulized budesonide, ipratropium, and albuterol for asthma.  CARDIOVASCULAR A:  CAD HTN PAF Evolving septic shock P:  Place TLC. Begin levophed. Stress dose steroids. Hold all anti-HTN.  RENAL A:   Lactic Acidosis P:   Replace electrolytes as indicated. Check CVP. NS 75 ml/hr.  GASTROINTESTINAL A:   Abnormal LFTs Probable cholangitis P:   Cholecystitis vs hepatic cyst, discussed with IR, will place a drain and awaiting surgery to call back, not sure if the hepatic cyst is a cyst of abscess, depending on discussion with surgery will follow recommendations.  HEMATOLOGIC A:   Hx of Antiphospholipid Ab Syndrome Hx of provoked DVT On rivaroxaban Hx of allergy to Heparin products P:  Hold rivaroxaban in anticipation of possible PTC Start argatroban once PTC is in place.  INFECTIOUS A:   Severe sepsis, probable biliary origin P:  Continue abx PTC per IR Awaiting surgery's input.  ENDOCRINE A:   Hypothyroidism P:   Cont home syndroid. Cortisol level. Stress dose steroids.  NEUROLOGIC A:   Need for sedation P:   D/C propfol. RASS goal: 0  FAMILY  - Updates: No family bedside.  - Inter-disciplinary family meet or Palliative Care meeting due by:  8/21  The patient is critically ill with multiple organ systems failure and requires high complexity decision making for assessment and support, frequent evaluation and titration of therapies, application  of advanced monitoring technologies and extensive interpretation of multiple databases.   Critical Care Time devoted to patient care services described in this note is  35  Minutes. This time reflects time of care of this signee Dr Jennet Maduro. This critical care time does not reflect procedure time, or teaching time or supervisory time of PA/NP/Med student/Med Resident etc but could involve care discussion time.  Rush Farmer, M.D. [facility] Pulmonary/Critical Care Medicine. Pager: [phone]. After hours pager: [phone].  06/04/2016, 10:13 AM

## 2016-06-04 NOTE — Progress Notes (Signed)
Initial Nutrition Assessment  DOCUMENTATION CODES:   Obesity unspecified  INTERVENTION:  Recommend the following tube feeding regimen: Initiate TF via OGT with Vital High Protein at goal rate of 45 ml/h (1080 ml per day) to provide 1080 kcals, 95 gm protein, 907 ml free water daily.    NUTRITION DIAGNOSIS:   Inadequate oral intake related to inability to eat as evidenced by NPO status.   GOAL:   Provide needs based on ASPEN/SCCM guidelines   MONITOR:   Vent status, Labs, Weight trends, TF tolerance, Skin, I & O's  REASON FOR ASSESSMENT:   Consult Assessment of nutrition requirement/status  ASSESSMENT:   33 yof who has multiple medical problems including apl syndrome on xarelto and cad presented to er with sudden chills and what edp described as epigastric pain.  She became less responsive in er and eventually was intubated. She underwent ruq [place] that shows really nl gb without stones and small amount fluid and ct that shows distended gb with some fluid.  She also appears to have right hepatic hemangioma.  Patient is currently intubated on ventilator support MV: 11.6 L/min Temp (24hrs), Avg:102.9 F (39.4 C), Min:97.4 F (36.3 C), Max:104.7 F (40.4 C)  Propofol: turned off this AM  Limited physical exam due to respiratory care and nursing care in progress at time of visit. Propofol turned off. OGT in place, no TF infusing. Pt appears well nourished, possibly temporal wasting. ? Accuracy of measurements as patient does not appear obese.    Labs: elevated AST/ALT, high triglycerides, low potassium  Diet Order:   NPO  Skin:  Reviewed, no issues  Last BM:  unknown  Height:   Ht Readings from Last 1 Encounters:  06/04/16 5' (1.524 m)    Weight:   Wt Readings from Last 1 Encounters:  06/04/16 165 lb 5.5 oz (75 kg)    Ideal Body Weight:  45.5 kg  BMI:  Body mass index is 32.29 kg/m.  Estimated Nutritional Needs:   Kcal:  [phone]  Protein:  >/91  grams  Fluid:  per MD  EDUCATION NEEDS:   No education needs identified at this time  [place], LDN Inpatient Clinical Dietitian Pager: [phone] After Hours Pager: [phone]

## 2016-06-04 NOTE — ED Notes (Signed)
CCM at bedside 

## 2016-06-04 NOTE — Progress Notes (Signed)
Pharmacy Antibiotic Note  Latoya [name] is a 70 y.o. female admitted on 06/04/2016 with intra-abd infection.  Pharmacy has been consulted for Zosyn dosing.   Zosyn 3.375gm IV given in ED ~0415.  Plan: Zosyn 3.375gm IV q8h -doses over 4 hours Will f/u micro data, renal function, and pt's clinical condition   Height: 5\' 2"  (157.5 cm) Weight: 160 lb (72.6 kg) IBW/kg (Calculated) : 50.1  Temp (24hrs), Avg:99.6 F (37.6 C), Min:97.4 F (36.3 C), Max:101.7 F (38.7 C)   Recent Labs Lab 06/04/16 0045 06/04/16 0103 06/04/16 0332  WBC 15.9*  --   --   CREATININE 0.68  --   --   LATICACIDVEN  --  6.88* 6.06*    Estimated Creatinine Clearance: 61.9 mL/min (by C-G formula based on SCr of 0.8 mg/dL).    Allergies  Allergen Reactions  . Hornet Venom Anaphylaxis  . Pork-Derived Products Anaphylaxis  . Chlorhexidine Itching    Reports that it is the dye in it  . Enoxaparin Sodium     REACTION: thrombocytopenia  . Heparin     REACTION: thrombocytopenia  . Lactose Intolerance (Gi)   . Other     Sensitive to dye in Betadine & Chlorohexadine   . Povidone-Iodine     Sensitivity- but if its wiped off she is able to tolerate betadine   . Indomethacin Rash  . Phenylpropanolamine Rash    Other Reaction: other reaction  . Povidone Iodine Rash    Antimicrobials this admission: 8/14 Zosyn >>   Microbiology results: 8/14 BCx x2:  8/14 UCx:    Thank you for allowing pharmacy to be a part of this patient's care.  Sherlon Handing, PharmD, BCPS Clinical pharmacist, pager [phone] 06/04/2016 4:17 AM

## 2016-06-04 NOTE — Consult Note (Signed)
Reason for Consult:abd pain Referring Physician: Dr Shirlyn Goltz  Latoya [name] is an 70 y.o. female.  HPI: 89 yof who has multiple medical problems including apl syndrome on xarelto and cad presented to er with sudden chills and what edp described as epigastric pain.  She became less responsive in er and eventually was intubated. She underwent ruq [place] that shows really nl gb without stones and small amount fluid and ct that shows distended gb with some fluid.  She also appears to have right hepatic hemangioma. She is not intubated, sedated   Past Medical History:  Diagnosis Date  . Anemia, iron deficiency   . Antiphospholipid syndrome (HCC)    with hypercoagulable state  . Asthma    extrinsic; moderate, persistant, nml spirometry 2010, nml CXR 1/08  . Bladder troubles    REPORTS INFECTIONS ON OCCASION DUE TO URETHRA MEATUS STRICTURE AT BIRTH   . Blood dyscrasia    antiphospholipid disorder  . CAD (coronary artery disease)    50% mid LAD, 80% ostial D1 and moderate 80% mid circ by vath 2003  . Cancer ([place])    basal cell removed fr. L arm   . Complication of anesthesia    cardiac arrest- in OR, at age 80 (54)y.o. during Scalenotomy in her early 20's  . Coronary heart disease   . Drug allergy    heparin/lovenox  . DVT (deep venous thrombosis) ([place])   . Erosive gastritis 1994  . Family history of adverse reaction to anesthesia    some family members have had trouble waking up  . GERD (gastroesophageal reflux disease)   . H/O hiatal hernia   . HLD (hyperlipidemia)   . HTN (hypertension)    [name] at [facility], manages pt.  LOV 03/2014  . Hypothyroidism   . Liver spot    cyst- - no biopsy, but told that its benign   . PAF (paroxysmal atrial fibrillation) (HCC)    not on coumadin therapy  . Pulmonary embolism ([place])    related to back surgery with prior coumadin use, now off  . Spondylosis, lumbosacral    ARTHRITIS- OA     Past Surgical History:  Procedure Laterality Date  .  ABDOMINAL HYSTERECTOMY     partial abdominal -1998  . BACK SURGERY     x13 cervical and lumbar thoracic spine surgery  . BREAST ENHANCEMENT SURGERY    . BREAST SURGERY     all benign cysts x3   . CARDIAC CATHETERIZATION     2005  . cleft lip and palate repair     70 yo   . hysterectomy    . JOINT REPLACEMENT    . POSTERIOR CERVICAL FUSION/FORAMINOTOMY Right 08/31/2014   Procedure: Right cervical six-seven foraminotomy, Cervical five-Thoracic one fusion, Cervical six-seven lateral mass screws;  Surgeon: Erline Levine, MD;  Location: [facility];  Service: Neurosurgery;  Laterality: Right;  . spina bifida repair     70 yo   . TONSILLECTOMY    . TOTAL KNEE ARTHROPLASTY     left    Family History  Problem Relation Age of Onset  . Heart disease Father     MI   . Heart disease Sister   .       Social History:  reports that she has never smoked. She has never used smokeless tobacco. She reports that she does not drink alcohol or use drugs.  Allergies:  Allergies  Allergen Reactions  . Hornet Venom Anaphylaxis  . Pork-Derived  Products Anaphylaxis  . Chlorhexidine Itching    Reports that it is the dye in it  . Enoxaparin Sodium     REACTION: thrombocytopenia  . Heparin     REACTION: thrombocytopenia  . Lactose Intolerance (Gi)   . Other     Sensitive to dye in Betadine & Chlorohexadine   . Povidone-Iodine     Sensitivity- but if its wiped off she is able to tolerate betadine   . Indomethacin Rash  . Phenylpropanolamine Rash    Other Reaction: other reaction  . Povidone Iodine Rash    Medications: I have reviewed the patient's current medications.  Results for orders placed or performed during the hospital encounter of 06/04/16 (from the past 48 hour(s))  CBC     Status: Abnormal   Collection Time: 06/04/16 12:45 AM  Result Value Ref Range   WBC 15.9 (H) 4.0 - 10.5 K/uL   RBC 5.47 (H) 3.87 - 5.11 MIL/uL   Hemoglobin 16.0 (H) 12.0 - 15.0 g/dL   HCT 47.9 (H) 36.0 -  46.0 %   MCV 87.6 78.0 - 100.0 fL   MCH 29.3 26.0 - 34.0 pg   MCHC 33.4 30.0 - 36.0 g/dL   RDW 13.8 11.5 - 15.5 %   Platelets 283 150 - 400 K/uL  Troponin I     Status: None   Collection Time: 06/04/16 12:45 AM  Result Value Ref Range   Troponin I <0.03 <0.03 ng/mL  Comprehensive metabolic panel     Status: Abnormal   Collection Time: 06/04/16 12:45 AM  Result Value Ref Range   Sodium 134 (L) 135 - 145 mmol/L   Potassium 4.0 3.5 - 5.1 mmol/L   Chloride 98 (L) 101 - 111 mmol/L   CO2 21 (L) 22 - 32 mmol/L   Glucose, Bld 171 (H) 65 - 99 mg/dL   BUN 14 6 - 20 mg/dL   Creatinine, Ser 0.68 0.44 - 1.00 mg/dL   Calcium 9.6 8.9 - 10.3 mg/dL   Total Protein 7.6 6.5 - 8.1 g/dL   Albumin 4.5 3.5 - 5.0 g/dL   AST 500 (H) 15 - 41 U/L   ALT 291 (H) 14 - 54 U/L   Alkaline Phosphatase 138 (H) 38 - 126 U/L   Total Bilirubin 2.7 (H) 0.3 - 1.2 mg/dL   GFR calc non Af Amer >60 >60 mL/min   GFR calc Af Amer >60 >60 mL/min    Comment: (NOTE) The eGFR has been calculated using the CKD EPI equation. This calculation has not been validated in all clinical situations. eGFR's persistently <60 mL/min signify possible Chronic Kidney Disease.    Anion gap 15 5 - 15  Lipase, blood     Status: None   Collection Time: 06/04/16 12:45 AM  Result Value Ref Range   Lipase 26 11 - 51 U/L  Triglycerides     Status: Abnormal   Collection Time: 06/04/16 12:45 AM  Result Value Ref Range   Triglycerides 217 (H) <150 mg/dL  I-Stat CG4 Lactic Acid, ED     Status: Abnormal   Collection Time: 06/04/16  1:03 AM  Result Value Ref Range   Lactic Acid, Venous 6.88 (HH) 0.5 - 1.9 mmol/L   Comment NOTIFIED PHYSICIAN   Urinalysis, Routine w reflex microscopic (not at [facility])     Status: Abnormal   Collection Time: 06/04/16  1:33 AM  Result Value Ref Range   Color, Urine YELLOW YELLOW   APPearance CLEAR CLEAR   Specific  Gravity, Urine 1.021 1.005 - 1.030   pH 5.0 5.0 - 8.0   Glucose, UA NEGATIVE NEGATIVE mg/dL   Hgb  urine dipstick MODERATE (A) NEGATIVE   Bilirubin Urine SMALL (A) NEGATIVE   Ketones, ur NEGATIVE NEGATIVE mg/dL   Protein, ur 100 (A) NEGATIVE mg/dL   Nitrite NEGATIVE NEGATIVE   Leukocytes, UA NEGATIVE NEGATIVE  Urine microscopic-add on     Status: Abnormal   Collection Time: 06/04/16  1:33 AM  Result Value Ref Range   Squamous Epithelial / LPF 0-5 (A) NONE SEEN   WBC, UA 0-5 0 - 5 WBC/hpf   RBC / HPF 6-30 0 - 5 RBC/hpf   Bacteria, UA RARE (A) NONE SEEN  I-Stat CG4 Lactic Acid, ED     Status: Abnormal   Collection Time: 06/04/16  3:32 AM  Result Value Ref Range   Lactic Acid, Venous 6.06 (HH) 0.5 - 1.9 mmol/L   Comment NOTIFIED PHYSICIAN   I-Stat CG4 Lactic Acid, ED     Status: Abnormal   Collection Time: 06/04/16  5:13 AM  Result Value Ref Range   Lactic Acid, Venous 6.95 (HH) 0.5 - 1.9 mmol/L   Comment NOTIFIED PHYSICIAN   I-Stat arterial blood gas, ED     Status: Abnormal   Collection Time: 06/04/16  5:17 AM  Result Value Ref Range   pH, Arterial 7.339 (L) 7.350 - 7.450   pCO2 arterial 32.9 (L) 35.0 - 45.0 mmHg   pO2, Arterial 72.0 (L) 80.0 - 100.0 mmHg   Bicarbonate 17.5 (L) 20.0 - 24.0 mEq/L   TCO2 18 0 - 100 mmol/L   O2 Saturation 92.0 %   Acid-base deficit 7.0 (H) 0.0 - 2.0 mmol/L   Patient temperature 100.9 F    Collection site RADIAL, [name]'S TEST ACCEPTABLE    Drawn by RT    Sample type ARTERIAL   Comprehensive metabolic panel     Status: Abnormal   Collection Time: 06/04/16  5:20 AM  Result Value Ref Range   Sodium 139 135 - 145 mmol/L   Potassium 3.2 (L) 3.5 - 5.1 mmol/L    Comment: DELTA CHECK NOTED   Chloride 105 101 - 111 mmol/L   CO2 22 22 - 32 mmol/L   Glucose, Bld 159 (H) 65 - 99 mg/dL   BUN 13 6 - 20 mg/dL   Creatinine, Ser 0.68 0.44 - 1.00 mg/dL   Calcium 8.9 8.9 - 10.3 mg/dL   Total Protein 5.7 (L) 6.5 - 8.1 g/dL   Albumin 3.4 (L) 3.5 - 5.0 g/dL   AST 752 (H) 15 - 41 U/L   ALT 486 (H) 14 - 54 U/L   Alkaline Phosphatase 129 (H) 38 - 126 U/L    Total Bilirubin 2.5 (H) 0.3 - 1.2 mg/dL   GFR calc non Af Amer >60 >60 mL/min   GFR calc Af Amer >60 >60 mL/min    Comment: (NOTE) The eGFR has been calculated using the CKD EPI equation. This calculation has not been validated in all clinical situations. eGFR's persistently <60 mL/min signify possible Chronic Kidney Disease.    Anion gap 12 5 - 15    Dg Chest 2 View  Result Date: 06/04/2016 CLINICAL DATA:  Mid chest pain with shortness of breath and chills tonight. EXAM: CHEST  2 VIEW COMPARISON:  12/30/2014 FINDINGS: Postoperative changes in the cervical and thoracolumbar spine. Thoracolumbar scoliosis. Shallow inspiration. Heart size is obscured by the abdominal contents but does not appear enlarged grossly. Linear scarring  in the right mid and lower lung appears similar to previous study. Elevation of the right hemidiaphragm. No focal consolidation or edema. No blunting of costophrenic angles. No pneumothorax. IMPRESSION: No evidence of active pulmonary disease. Stable appearance of chronic changes since previous study. Electronically Signed   By: Lucienne Capers M.D.   On: 06/04/2016 00:32   Dg Chest Port 1 View  Result Date: 06/04/2016 CLINICAL DATA:  Initial evaluation status post intubation. EXAM: PORTABLE CHEST 1 VIEW COMPARISON:  Prior radiograph from 06/03/2016. FINDINGS: There has been interval placement of an endotracheal tube with tip position 2.3 cm above the carina. Enteric tube courses in the the abdomen. Cardiac and mediastinal silhouettes are stable. Persistent elevation of the right hemidiaphragm with associated right basilar atelectasis/ scarring. Probable left basilar atelectasis present as well. There is increased vascular congestion as compared to prior without frank pulmonary edema. No pneumothorax. Osseous structures unchanged.  Spinal fixation hardware noted. IMPRESSION: 1. Tip of the endotracheal to 2.3 cm above the carina. 2. Slightly increased vascular congestion as  compared to prior without overt pulmonary edema. 3. Otherwise stable appearance of the chest. Electronically Signed   By: Jeannine Boga M.D.   On: 06/04/2016 06:23   Ct Angio Abd/pel W And/or Wo Contrast  Result Date: 06/04/2016 CLINICAL DATA:  Acute onset of generalized chest and abdominal pain. Initial encounter. EXAM: CTA ABDOMEN AND PELVIS wITHOUT AND WITH CONTRAST TECHNIQUE: Multidetector CT imaging of the abdomen and pelvis was performed using the standard protocol during bolus administration of intravenous contrast. Multiplanar reconstructed images and MIPs were obtained and reviewed to evaluate the vascular anatomy. CONTRAST:  100 mL of Isovue 370 IV contrast COMPARISON:  CT of the abdomen and pelvis from 11/19/2005 FINDINGS: Patchy bibasilar airspace opacities may reflect pneumonia, or possibly scarring. Bilateral breast implants are noted; there is chronic rupture of the right-sided breast implant, with leakage into the right lateral breast. The chronically ruptured appearance is stable from 2007. There is no evidence of aortic dissection. There is no evidence of aneurysmal dilatation. Mild calcification is seen along the distal abdominal aorta and its branches. The celiac trunk, superior mesenteric artery, bilateral renal arteries and inferior mesenteric artery appear patent. The inferior vena cava is grossly unremarkable in appearance. Note is made of a retroaortic left renal vein. A 7.1 cm hypodense mass is noted at the right hepatic lobe, with prominent peripheral vasculature, likely reflecting a large hemangioma. The spleen is unremarkable in appearance. The gallbladder is diffusely distended, with trace pericholecystic fluid, and dilatation of the common bile duct to 1.3 cm in diameter, concerning for distal obstruction. Prominent intrahepatic biliary ducts are noted. The pancreas and adrenal glands are grossly unremarkable in appearance. The kidneys are unremarkable in appearance. There  is no evidence of hydronephrosis. No renal or ureteral stones are seen. No perinephric stranding is appreciated. No free fluid is identified. The small bowel is unremarkable in appearance. The stomach is within normal limits. No acute vascular abnormalities are seen. The appendix is not definitely characterized; there is no evidence of appendicitis. The sigmoid colon is somewhat thick walled, though this would could reflect intraluminal contents. Would correlate for any associated symptoms. The bladder is mildly distended and grossly unremarkable. The patient is status post hysterectomy. No suspicious adnexal masses are seen. No inguinal lymphadenopathy is seen. No acute osseous abnormalities are identified. Thoracolumbar spinal fusion hardware is noted, with underlying decompression at the lower lumbar spine. Multilevel vacuum phenomenon is noted along the lower thoracic spine,  with associated diffuse sclerosis. Review of the MIP images confirms the above findings. IMPRESSION: 1. No evidence of aortic dissection. No evidence of aneurysmal dilatation. Mild calcification along the distal abdominal aorta and its branches. 2. Diffuse gallbladder distention, with trace pericholecystic fluid, and dilatation of the common bile duct to 1.3 cm in diameter, concerning for distal obstruction. Intrahepatic biliary ductal dilatation noted. MRCP would be helpful for further evaluation, to assess for underlying mass. 3. Somewhat thick-walled appearance to the sigmoid colon. This could simply reflect intraluminal contents, though would correlate for any associated symptoms. 4. Patchy bibasilar airspace opacities may reflect pneumonia, or possibly scarring. 5. 7.1 cm right hepatic mass, with prominent peripheral vasculature, likely reflects a large benign hemangioma. This has increased from 4.3 cm in 2007. 6. Chronic rupture of the patient's right-sided breast implant, stable from 2007, with leakage into the right lateral breast,  better characterized than in 2007. 7. Mild degenerative change along the lower thoracic spine. Electronically Signed   By: Garald Balding M.D.   On: 06/04/2016 03:26   US Abdomen Limited Ruq  Result Date: 06/04/2016 CLINICAL DATA:  Initial evaluation for EXAM: US ABDOMEN LIMITED - RIGHT UPPER QUADRANT COMPARISON:  Comparison made with concomitant CT of the abdomen and pelvis performed on the same day. FINDINGS: Gallbladder: No gallstones or wall thickening visualized. No sonographic Murphy sign noted by sonographer. Small amount of free pericholecystic fluid noted. Common bile duct: Diameter: 7 mm.  Felt to be within normal limits for age. Liver: Complex mass measuring 7.9 x 7.8 x 8.1 cm. This is better evaluated on concomitant CT. IMPRESSION: 1. Small amount of free pericholecystic fluid without other sonographic features to suggest acute cholecystitis. This may be related to overall volume status. No cholelithiasis or biliary dilatation. 2. 7.9 x 7.8 x 8.1 cm complex hepatic mass, better evaluated on concomitant CT the abdomen and pelvis. Electronically Signed   By: Jeannine Boga M.D.   On: 06/04/2016 03:03    Review of Systems  Unable to perform ROS: Intubated   Blood pressure 114/93, pulse 119, temperature (!) 104.4 F (40.2 C), temperature source Rectal, resp. rate (!) 32, height '5\' 2"'  (1.575 m), weight 72.6 kg (160 lb), SpO2 99 %. Physical Exam  Vitals reviewed. Constitutional: She appears well-developed and well-nourished.  HENT:  Head: Normocephalic.  Eyes: No scleral icterus.  Neck: Neck supple.  Cardiovascular: Regular rhythm.  Tachycardia present.   Respiratory:  Coarse breath sounds bilaterally  GI: Bowel sounds are normal. There is tenderness (mild epigastric tenderness with deep palpation).    Assessment/Plan: Elevated lfts, ? gb source of sepsis  I think gb certainly may be source as she is tender in this area.  I think if able to tolerate mrcp would be next best  step to determine how to figure this out.  Will follow  Riot Waterworth 06/04/2016, 6:50 AM

## 2016-06-04 NOTE — ED Notes (Signed)
Pt's oxygen sats 85% on room air after returning from radiology. Pt placed on 3L [place]; attempted to sit pt up, c/o severe pain in back and abdomen. sats improved to 96% on oxygen

## 2016-06-04 NOTE — Procedures (Signed)
Central Venous Catheter Insertion Procedure Note Urmi Mcfatter [facility] IB:[id number] 10-06-1946  Procedure: Insertion of Central Venous Catheter Indications: Assessment of intravascular volume, Drug and/or fluid administration and Frequent blood sampling  Procedure Details Consent: Risks of procedure as well as the alternatives and risks of each were explained to the (patient/caregiver).  Consent for procedure obtained. Time Out: Verified patient identification, verified procedure, site/side was marked, verified correct patient position, special equipment/implants available, medications/allergies/relevent history reviewed, required imaging and test results available.  Performed  Maximum sterile technique was used including antiseptics, cap, gloves, gown, hand hygiene, mask and sheet. Skin prep: Chlorhexidine; local anesthetic administered A antimicrobial bonded/coated triple lumen catheter was placed in the right internal jugular vein using the Seldinger technique. Ultrasound guidance used.Yes.   Catheter placed to 16 cm. Blood aspirated via all 3 ports and then flushed x 3. Line sutured x 2 and dressing applied.  Evaluation Blood flow good Complications: No apparent complications Patient did tolerate procedure well. Chest X-ray ordered to verify placement.  CXR: pending.  Georgann Housekeeper, AGACNP-BC [facility] Pager [phone] or [phone]  06/04/2016 12:55 PM  Rush Farmer, M.D. [facility] Pulmonary/Critical Care Medicine. Pager: [phone]. After hours pager: [phone].

## 2016-06-04 NOTE — ED Provider Notes (Signed)
[facility] DEPT Provider Note   CSN: [id number] Arrival date & time: 06/03/16  2324  First Provider Contact:  First MD Initiated Contact with Patient 06/04/16 0003     By signing my name below, I, Dora Sims, attest that this documentation has been prepared under the direction and in the presence of physician practitioner, Drenda Freeze, MD. Electronically Signed: Dora Sims, Scribe. 06/04/2016. 12:03 AM.  History   Chief Complaint Chief Complaint  Patient presents with  . Chest Pain    The history is provided by the patient. No language interpreter was used.     HPI Comments: Latoya [name] is a 70 y.o. female with PMHx of CAD, DVT, GERD, HTN, HLD, PAF, and PE who presents to the Emergency Department complaining of sudden onset, constant, chills beginning yesterday evening around 5 PM. Her husband states she experienced significant abdominal pain after eating around 5 PM. Pt also endorses chest pain, SOB, tremors, and generalized weakness. Pt states she had a BM last night which was normal. She endorses moderate abdominal pain currently. She notes a h/o pneumonia and PSHx of abdominal hysterectomy. She denies cough, vomiting, fever, dysuria, or any other associated symptoms.  Past Medical History:  Diagnosis Date  . Anemia, iron deficiency   . Antiphospholipid syndrome (HCC)    with hypercoagulable state  . Asthma    extrinsic; moderate, persistant, nml spirometry 2010, nml CXR 1/08  . Bladder troubles    REPORTS INFECTIONS ON OCCASION DUE TO URETHRA MEATUS STRICTURE AT BIRTH   . Blood dyscrasia    antiphospholipid disorder  . CAD (coronary artery disease)    50% mid LAD, 80% ostial D1 and moderate 80% mid circ by vath 2003  . Cancer ([place])    basal cell removed fr. L arm   . Complication of anesthesia    cardiac arrest- in OR, at age 89 (60)y.o. during Scalenotomy in her early 20's  . Coronary heart disease   . Drug allergy    heparin/lovenox  . DVT (deep  venous thrombosis) ([place])   . Erosive gastritis 1994  . Family history of adverse reaction to anesthesia    some family members have had trouble waking up  . GERD (gastroesophageal reflux disease)   . H/O hiatal hernia   . HLD (hyperlipidemia)   . HTN (hypertension)    [name], manages pt.  LOV 03/2014  . Hypothyroidism   . Liver spot    cyst- - no biopsy, but told that its benign   . PAF (paroxysmal atrial fibrillation) (HCC)    not on coumadin therapy  . Pulmonary embolism ([place])    related to back surgery with prior coumadin use, now off  . Spondylosis, lumbosacral    ARTHRITIS- OA     Patient Active Problem List   Diagnosis Date Noted  . Ascending cholangitis 06/04/2016  . Sepsis ([place]) 06/04/2016  . Hemangioma of liver 06/04/2016  . Cervical pseudoarthrosis ([place]) 08/31/2014  . Anaphylaxis, mild, due to wasp envenomation 04/12/2013  . Chest pain, atypical 04/11/2013  . PAD (peripheral artery disease) ([place]) 06/20/2011  . ANEMIA, IRON DEFICIENCY, MICROCYTIC 06/01/2010  . Primary hypercoagulable state ([place]) 06/01/2010  . BACK PAIN, CHRONIC 06/01/2010  . ABDOMINAL PAIN -GENERALIZED 06/01/2010  . CAROTID ARTERY DISEASE 05/25/2010  . CAD, NATIVE VESSEL 11/15/2009  . MURMUR 11/15/2009  . CAROTID BRUIT, LEFT 11/15/2009  . HYPERTENSION 03/18/2009  . History of pulmonary embolism 03/18/2009  . PAROXYSMAL ATRIAL FIBRILLATION 03/18/2009  . DVT 03/18/2009  .  Asthma 03/18/2009  . GERD 03/18/2009  . SPONDYLOSIS, LUMBAR 03/18/2009  . HYPERLIPIDEMIA 11/29/2008  . CORONARY HEART DISEASE 11/29/2008    Past Surgical History:  Procedure Laterality Date  . ABDOMINAL HYSTERECTOMY     partial abdominal -1998  . BACK SURGERY     x13 cervical and lumbar thoracic spine surgery  . BREAST ENHANCEMENT SURGERY    . BREAST SURGERY     all benign cysts x3   . CARDIAC CATHETERIZATION     2005  . cleft lip and palate repair     70 yo   . hysterectomy    . JOINT REPLACEMENT      . POSTERIOR CERVICAL FUSION/FORAMINOTOMY Right 08/31/2014   Procedure: Right cervical six-seven foraminotomy, Cervical five-Thoracic one fusion, Cervical six-seven lateral mass screws;  Surgeon: Erline Levine, MD;  Location: [facility];  Service: Neurosurgery;  Laterality: Right;  . spina bifida repair     70 yo   . TONSILLECTOMY    . TOTAL KNEE ARTHROPLASTY     left    OB History    No data available       Home Medications    Prior to Admission medications   Medication Sig Start Date End Date Taking? Authorizing Provider  albuterol (PROVENTIL HFA;VENTOLIN HFA) 108 (90 BASE) MCG/ACT inhaler Inhale 2 puffs into the lungs every 6 (six) hours as needed for wheezing or shortness of breath.    Yes Historical Provider, MD  aspirin 81 MG EC tablet Take 81 mg by mouth daily.    Yes Historical Provider, MD  atorvastatin (LIPITOR) 40 MG tablet Take 40 mg by mouth daily.   Yes Historical Provider, MD  azelastine (ASTELIN) 137 MCG/SPRAY nasal spray Place 1 spray into the nose 2 (two) times daily. Use in each nostril as directed For allergies   Yes Historical Provider, MD  beclomethasone (QVAR) 80 MCG/ACT inhaler Inhale 2 puffs into the lungs 2 (two) times daily. Patient taking differently: Inhale 2 puffs into the lungs 3 (three) times daily.  06/24/14  Yes Elsie Stain, MD  Calcium Carb-Cholecalciferol (CALCIUM 500 +D) 500-400 MG-UNIT TABS Take 1 tablet by mouth 2 (two) times daily.   Yes Historical Provider, MD  cyclobenzaprine (FLEXERIL) 10 MG tablet Take 10 mg by mouth 3 (three) times daily as needed. For severe muscle pain   Yes Historical Provider, MD  diazepam (VALIUM) 5 MG tablet Take 5 mg by mouth at bedtime as needed for anxiety or sedation.    Yes Historical Provider, MD  docusate sodium (COLACE) 100 MG capsule Take 200 mg by mouth 2 (two) times daily.    Yes Historical Provider, MD  EPINEPHrine (EPIPEN) 0.3 mg/0.3 mL DEVI Inject 0.3 mLs (0.3 mg total) into the muscle once. Patient  taking differently: Inject 0.3 mg into the muscle once as needed.  04/12/13  Yes Christina P Rama, MD  esomeprazole (NEXIUM) 40 MG capsule Take 40 mg by mouth 2 (two) times daily.    Yes Historical Provider, MD  FentaNYL 37.5 MCG/HR PT72 Place 1 patch onto the skin every 3 (three) days.   Yes Historical Provider, MD  Glucosamine-Chondroitin 750-600 MG TABS Take 1 tablet by mouth 2 (two) times daily.   Yes Historical Provider, MD  HYDROmorphone (DILAUDID) 4 MG tablet Take 4-8 mg by mouth every 4 (four) hours as needed. For pain   Yes Historical Provider, MD  levothyroxine (SYNTHROID, LEVOTHROID) 75 MCG tablet Take 75 mcg by mouth daily before breakfast.  Yes Historical Provider, MD  lidocaine (LIDODERM) 5 % Place 1 patch onto the skin daily. Remove & Discard patch within 12 hours or as directed by MD   Yes Historical Provider, MD  Loratadine-Pseudoephedrine (CLARITIN-D 12 HOUR PO) Take 1 tablet by mouth 2 (two) times daily.    Yes Historical Provider, MD  Melatonin 2.5 MG CAPS Take 2.5-5 mg by mouth at bedtime.   Yes Historical Provider, MD  metaxalone (SKELAXIN) 800 MG tablet Take 800 mg by mouth every 8 (eight) hours as needed. For muscle pain   Yes Historical Provider, MD  metoprolol (LOPRESSOR) 50 MG tablet Take 1 tablet (50 mg total) by mouth 2 (two) times daily. 11/09/15  Yes Burtis Junes, NP  montelukast (SINGULAIR) 10 MG tablet Take 10 mg by mouth at bedtime.    Yes Historical Provider, MD  Multiple Vitamin (MULTIVITAMIN WITH MINERALS) TABS Take 1 tablet by mouth daily.   Yes Historical Provider, MD  NITROSTAT 0.4 MG SL tablet DISSOLVE 1 TABLET UNDER TONGUE AS NEEDED FOR CHEST PAIN,MAY REPEAT IN5 MINUTES FOR 2 DOSES. 02/24/16  Yes Burnell Blanks, MD  Respiratory Therapy Supplies (FLUTTER) DEVI Use as directed 06/06/15  Yes Tanda Rockers, MD  rivaroxaban (XARELTO) 20 MG TABS tablet Take 1 tablet (20 mg total) by mouth daily with supper. 12/21/15  Yes Burnell Blanks, MD    triamcinolone (NASACORT) 55 MCG/ACT nasal inhaler Place 1-2 sprays into the nose 2 (two) times daily.    Yes Historical Provider, MD  atorvastatin (LIPITOR) 20 MG tablet Take 1 tablet (20 mg total) by mouth daily. Patient not taking: Reported on 06/04/2016 11/09/15   Burtis Junes, NP    Family History Family History  Problem Relation Age of Onset  . Heart disease Father     MI   . Heart disease Sister   .       Social History Social History  Substance Use Topics  . Smoking status: Never Smoker  . Smokeless tobacco: Never Used     Comment: no smoking   . Alcohol use No     Allergies   Hornet venom; Pork-derived products; Chlorhexidine; Enoxaparin sodium; Heparin; Lactose intolerance (gi); Other; Povidone-iodine; Indomethacin; Phenylpropanolamine; and Povidone iodine   Review of Systems Review of Systems  Constitutional: Positive for chills. Negative for fever.  Respiratory: Positive for shortness of breath. Negative for cough.   Cardiovascular: Positive for chest pain.  Gastrointestinal: Positive for abdominal pain. Negative for vomiting.  Genitourinary: Negative for dysuria.  Neurological: Positive for tremors and weakness (generalized).  All other systems reviewed and are negative.   Physical Exam Updated Vital Signs BP 188/82   Pulse (!) 129   Temp 101.7 F (38.7 C) (Oral)   Resp (!) 46   Ht 5\' 2"  (1.575 m)   Wt 160 lb (72.6 kg)   SpO2 95%   BMI 29.26 kg/m   Physical Exam  Constitutional: No distress.  Tremors, mild distress   HENT:  Head: Normocephalic and atraumatic.  Eyes: Conjunctivae and EOM are normal.  Neck: Neck supple. No tracheal deviation present.  Cardiovascular: Normal rate.   Pulmonary/Chest: Effort normal. No respiratory distress.  Diminished bilateral bases.  Abdominal: There is no tenderness.  + mild epigastric tenderness   Musculoskeletal: Normal range of motion.  Neurological: She is alert.  Slightly confused   Skin: Skin is  warm and dry.  Psychiatric: She has a normal mood and affect. Her behavior is normal.  Nursing note and  vitals reviewed.    ED Treatments / Results  Labs (all labs ordered are listed, but only abnormal results are displayed) Labs Reviewed  CBC - Abnormal; Notable for the following:       Result Value   WBC 15.9 (*)    RBC 5.47 (*)    Hemoglobin 16.0 (*)    HCT 47.9 (*)    All other components within normal limits  COMPREHENSIVE METABOLIC PANEL - Abnormal; Notable for the following:    Sodium 134 (*)    Chloride 98 (*)    CO2 21 (*)    Glucose, Bld 171 (*)    AST 500 (*)    ALT 291 (*)    Alkaline Phosphatase 138 (*)    Total Bilirubin 2.7 (*)    All other components within normal limits  URINALYSIS, ROUTINE W REFLEX MICROSCOPIC (NOT AT [facility]) - Abnormal; Notable for the following:    Hgb urine dipstick MODERATE (*)    Bilirubin Urine SMALL (*)    Protein, ur 100 (*)    All other components within normal limits  URINE MICROSCOPIC-ADD ON - Abnormal; Notable for the following:    Squamous Epithelial / LPF 0-5 (*)    Bacteria, UA RARE (*)    All other components within normal limits  I-STAT CG4 LACTIC ACID, ED - Abnormal; Notable for the following:    Lactic Acid, Venous 6.88 (*)    All other components within normal limits  I-STAT CG4 LACTIC ACID, ED - Abnormal; Notable for the following:    Lactic Acid, Venous 6.06 (*)    All other components within normal limits  CULTURE, BLOOD (ROUTINE X 2)  CULTURE, BLOOD (ROUTINE X 2)  URINE CULTURE  TROPONIN I  LIPASE, BLOOD    EKG  EKG Interpretation  Date/Time:  Sunday June 03 2016 23:28:50 EDT Ventricular Rate:  91 PR Interval:  126 QRS Duration: 80 QT Interval:  372 QTC Calculation: 457 R Axis:   47 Text Interpretation:  Normal sinus rhythm Normal ECG No significant change since last tracing Confirmed by Carlette Palmatier  MD, Bobbye Reinitz ([id number]) on 06/04/2016 12:04:19 AM       Radiology Dg Chest 2 View  Result Date:  06/04/2016 CLINICAL DATA:  Mid chest pain with shortness of breath and chills tonight. EXAM: CHEST  2 VIEW COMPARISON:  12/30/2014 FINDINGS: Postoperative changes in the cervical and thoracolumbar spine. Thoracolumbar scoliosis. Shallow inspiration. Heart size is obscured by the abdominal contents but does not appear enlarged grossly. Linear scarring in the right mid and lower lung appears similar to previous study. Elevation of the right hemidiaphragm. No focal consolidation or edema. No blunting of costophrenic angles. No pneumothorax. IMPRESSION: No evidence of active pulmonary disease. Stable appearance of chronic changes since previous study. Electronically Signed   By: Lucienne Capers M.D.   On: 06/04/2016 00:32   Ct Angio Abd/pel W And/or Wo Contrast  Result Date: 06/04/2016 CLINICAL DATA:  Acute onset of generalized chest and abdominal pain. Initial encounter. EXAM: CTA ABDOMEN AND PELVIS wITHOUT AND WITH CONTRAST TECHNIQUE: Multidetector CT imaging of the abdomen and pelvis was performed using the standard protocol during bolus administration of intravenous contrast. Multiplanar reconstructed images and MIPs were obtained and reviewed to evaluate the vascular anatomy. CONTRAST:  100 mL of Isovue 370 IV contrast COMPARISON:  CT of the abdomen and pelvis from 11/19/2005 FINDINGS: Patchy bibasilar airspace opacities may reflect pneumonia, or possibly scarring. Bilateral breast implants are noted; there is chronic rupture of  the right-sided breast implant, with leakage into the right lateral breast. The chronically ruptured appearance is stable from 2007. There is no evidence of aortic dissection. There is no evidence of aneurysmal dilatation. Mild calcification is seen along the distal abdominal aorta and its branches. The celiac trunk, superior mesenteric artery, bilateral renal arteries and inferior mesenteric artery appear patent. The inferior vena cava is grossly unremarkable in appearance. Note is  made of a retroaortic left renal vein. A 7.1 cm hypodense mass is noted at the right hepatic lobe, with prominent peripheral vasculature, likely reflecting a large hemangioma. The spleen is unremarkable in appearance. The gallbladder is diffusely distended, with trace pericholecystic fluid, and dilatation of the common bile duct to 1.3 cm in diameter, concerning for distal obstruction. Prominent intrahepatic biliary ducts are noted. The pancreas and adrenal glands are grossly unremarkable in appearance. The kidneys are unremarkable in appearance. There is no evidence of hydronephrosis. No renal or ureteral stones are seen. No perinephric stranding is appreciated. No free fluid is identified. The small bowel is unremarkable in appearance. The stomach is within normal limits. No acute vascular abnormalities are seen. The appendix is not definitely characterized; there is no evidence of appendicitis. The sigmoid colon is somewhat thick walled, though this would could reflect intraluminal contents. Would correlate for any associated symptoms. The bladder is mildly distended and grossly unremarkable. The patient is status post hysterectomy. No suspicious adnexal masses are seen. No inguinal lymphadenopathy is seen. No acute osseous abnormalities are identified. Thoracolumbar spinal fusion hardware is noted, with underlying decompression at the lower lumbar spine. Multilevel vacuum phenomenon is noted along the lower thoracic spine, with associated diffuse sclerosis. Review of the MIP images confirms the above findings. IMPRESSION: 1. No evidence of aortic dissection. No evidence of aneurysmal dilatation. Mild calcification along the distal abdominal aorta and its branches. 2. Diffuse gallbladder distention, with trace pericholecystic fluid, and dilatation of the common bile duct to 1.3 cm in diameter, concerning for distal obstruction. Intrahepatic biliary ductal dilatation noted. MRCP would be helpful for further  evaluation, to assess for underlying mass. 3. Somewhat thick-walled appearance to the sigmoid colon. This could simply reflect intraluminal contents, though would correlate for any associated symptoms. 4. Patchy bibasilar airspace opacities may reflect pneumonia, or possibly scarring. 5. 7.1 cm right hepatic mass, with prominent peripheral vasculature, likely reflects a large benign hemangioma. This has increased from 4.3 cm in 2007. 6. Chronic rupture of the patient's right-sided breast implant, stable from 2007, with leakage into the right lateral breast, better characterized than in 2007. 7. Mild degenerative change along the lower thoracic spine. Electronically Signed   By: Garald Balding M.D.   On: 06/04/2016 03:26   US Abdomen Limited Ruq  Result Date: 06/04/2016 CLINICAL DATA:  Initial evaluation for EXAM: US ABDOMEN LIMITED - RIGHT UPPER QUADRANT COMPARISON:  Comparison made with concomitant CT of the abdomen and pelvis performed on the same day. FINDINGS: Gallbladder: No gallstones or wall thickening visualized. No sonographic Murphy sign noted by sonographer. Small amount of free pericholecystic fluid noted. Common bile duct: Diameter: 7 mm.  Felt to be within normal limits for age. Liver: Complex mass measuring 7.9 x 7.8 x 8.1 cm. This is better evaluated on concomitant CT. IMPRESSION: 1. Small amount of free pericholecystic fluid without other sonographic features to suggest acute cholecystitis. This may be related to overall volume status. No cholelithiasis or biliary dilatation. 2. 7.9 x 7.8 x 8.1 cm complex hepatic mass, better evaluated on concomitant  CT the abdomen and pelvis. Electronically Signed   By: Jeannine Boga M.D.   On: 06/04/2016 03:03    Procedures .Intubation Date/Time: 06/04/2016 5:41 AM Performed by: Drenda Freeze Authorized by: Drenda Freeze   Consent:    Consent obtained:  Verbal   Consent given by:  Guardian   Risks discussed:   Aspiration Pre-procedure details:    Patient status:  Altered mental status Procedure details:    Preoxygenation:  BiPAP   CPR in progress: no     Intubation method:  Oral   Laryngoscope blade:  Mac 4   Tube size (mm):  7.5   Tube type:  Cuffed   Number of attempts:  1   Ventilation between attempts: no     Cricoid pressure: no     Tube visualized through cords: yes   Placement assessment:    ETT to lip:  21   Breath sounds:  Equal   Placement verification: chest rise     CXR findings:  ETT in proper place Post-procedure details:    Patient tolerance of procedure:  Tolerated well, no immediate complications   (including critical care time)  CRITICAL CARE Performed by: Wandra Arthurs   Total critical care time: 30 minutes  Critical care time was exclusive of separately billable procedures and treating other patients.  Critical care was necessary to treat or prevent imminent or life-threatening deterioration.  Critical care was time spent personally by me on the following activities: development of treatment plan with patient and/or surrogate as well as nursing, discussions with consultants, evaluation of patient's response to treatment, examination of patient, obtaining history from patient or surrogate, ordering and performing treatments and interventions, ordering and review of laboratory studies, ordering and review of radiographic studies, pulse oximetry and re-evaluation of patient's condition.   DIAGNOSTIC STUDIES: Oxygen Saturation is 92% on RA, low by my interpretation.    COORDINATION OF CARE: 12:04 AM Discussed treatment plan with pt at bedside and pt agreed to plan.  Medications Ordered in ED Medications  iopamidol (ISOVUE-370) 76 % injection (not administered)  piperacillin-tazobactam (ZOSYN) IVPB 3.375 g (3.375 g Intravenous New Bag/Given 06/04/16 0414)  piperacillin-tazobactam (ZOSYN) IVPB 3.375 g (not administered)  sodium chloride 0.9 % bolus 1,000 mL (0 mLs  Intravenous Stopped 06/04/16 0421)  acetaminophen (TYLENOL) tablet 650 mg (650 mg Oral Given 06/04/16 0059)  sodium chloride 0.9 % bolus 1,000 mL (1,000 mLs Intravenous New Bag/Given 06/04/16 0148)  ondansetron (ZOFRAN) injection 4 mg (4 mg Intravenous Given 06/04/16 0148)     Initial Impression / Assessment and Plan / ED Course  I have reviewed the triage vital signs and the nursing notes.  Pertinent labs & imaging results that were available during my care of the patient were reviewed by me and considered in my medical decision making (see chart for details).  Clinical Course   Latoya [name] is a 70 y.o. female hx of antiphospholipid syndrome on xarelto, CAD, here with abdominal pain, chills. Afebrile initially, but has chills so concerned for possible infection. Consider UTI, pneumonia vs enteritis vs cholecystitis. Will get labs, lactate, UA, CXR.   1 am Patient' lactate 6.88. Will get CT ab/pel and RUQ [place] to further assess.   4 am CT showed percholecystic fluid and known liver hemangioma. WBC 15, LFTs elevated. US showed pericholecystic fluid with no gallstones. Concerned for acalculous cholecystitis vs cholangitis. Consulted Dr. Donne Hazel from surgery. He will see patient and recommend HIDA scan and likely percutaneous drain  by IR. She is now more tachypneic and tachycardic. Still not hypotensive. Ordered zosyn, 2 L NS bolus. Consulted Dr. Alcario Drought from hospitalist, who recommend ICU admission. She is tachypneic to 40s now, mental status declining. Will try bipap for now.   4:30 pm Discussed with Dr. Beatrix Shipper from critical care, who will see patient.   5:43 AM ICU saw patient. On Bipap and pH 7.33, CO2 32. She is compensated for now. However, mental status poor. ICU recommend planned intubation as she has ascending cholangitis that may get worse before it gets better. I gave her etomidate, succinylcholine. Patient intubated with 7.5 ET tube. ICU to admit.   I personally performed the  services described in this documentation, which was scribed in my presence. The recorded information has been reviewed and is accurate.  Final Clinical Impressions(s) / ED Diagnoses   Final diagnoses:  RUQ pain  Elevated LFTs    New Prescriptions New Prescriptions   No medications on file     Drenda Freeze, MD 06/04/16 [phone]

## 2016-06-04 NOTE — ED Notes (Signed)
Dr.Yao aware of blood pressure

## 2016-06-04 NOTE — ED Notes (Signed)
Pt placed on bipap  

## 2016-06-04 NOTE — ED Notes (Signed)
Pt noted to have vomit on sheets in bed; reports nausea

## 2016-06-04 NOTE — Progress Notes (Signed)
[facility] Progress Note Patient Name: Latoya [name] [facility] DOB: Dec 22, 1945 MRN: [id number]   Date of Service  06/04/2016  HPI/Events of Note  Notified by bedside nurse of persistent fever and tachycardia. Reportedly patient has no complaints of pain currently. Also reportedly CVP now 0. Vasopressor requirement decreasing with normotension. Unclear whether or not this tachycardia is response to the fever or possibly to intravascular hypovolemia.   eICU Interventions  1. Continue to monitor vitals and wean vasopressor support 2. LR 500 mL bolus now & consider further fluid resuscitation appearing upon response      Intervention Category Intermediate Interventions: Other:  Latoya [name] 06/04/2016, 9:39 PM

## 2016-06-05 ENCOUNTER — Inpatient Hospital Stay (HOSPITAL_COMMUNITY): Payer: Medicare Other

## 2016-06-05 DIAGNOSIS — J9601 Acute respiratory failure with hypoxia: Secondary | ICD-10-CM

## 2016-06-05 DIAGNOSIS — K830 Cholangitis: Secondary | ICD-10-CM

## 2016-06-05 DIAGNOSIS — A419 Sepsis, unspecified organism: Secondary | ICD-10-CM

## 2016-06-05 LAB — GLUCOSE, CAPILLARY
GLUCOSE-CAPILLARY: 116 mg/dL — AB (ref 65–99)
GLUCOSE-CAPILLARY: 143 mg/dL — AB (ref 65–99)
GLUCOSE-CAPILLARY: 156 mg/dL — AB (ref 65–99)
GLUCOSE-CAPILLARY: 85 mg/dL (ref 65–99)
GLUCOSE-CAPILLARY: 99 mg/dL (ref 65–99)
Glucose-Capillary: 121 mg/dL — ABNORMAL HIGH (ref 65–99)
Glucose-Capillary: 139 mg/dL — ABNORMAL HIGH (ref 65–99)

## 2016-06-05 LAB — MAGNESIUM
MAGNESIUM: 1.3 mg/dL — AB (ref 1.7–2.4)
MAGNESIUM: 3.3 mg/dL — AB (ref 1.7–2.4)

## 2016-06-05 LAB — BASIC METABOLIC PANEL
ANION GAP: 15 (ref 5–15)
ANION GAP: 10 (ref 5–15)
BUN: 26 mg/dL — ABNORMAL HIGH (ref 6–20)
BUN: 35 mg/dL — ABNORMAL HIGH (ref 6–20)
CALCIUM: 7.5 mg/dL — AB (ref 8.9–10.3)
CHLORIDE: 111 mmol/L (ref 101–111)
CO2: 19 mmol/L — ABNORMAL LOW (ref 22–32)
CO2: 19 mmol/L — AB (ref 22–32)
CREATININE: 1.1 mg/dL — AB (ref 0.44–1.00)
Calcium: 8 mg/dL — ABNORMAL LOW (ref 8.9–10.3)
Chloride: 109 mmol/L (ref 101–111)
Creatinine, Ser: 1.27 mg/dL — ABNORMAL HIGH (ref 0.44–1.00)
GFR calc Af Amer: 49 mL/min — ABNORMAL LOW (ref >60)
GFR calc non Af Amer: 50 mL/min — ABNORMAL LOW (ref >60)
GFR calc non Af Amer: 42 mL/min — ABNORMAL LOW (ref >60)
GFR, EST AFRICAN AMERICAN: 58 mL/min — AB (ref >60)
GLUCOSE: 137 mg/dL — AB (ref 65–99)
Glucose, Bld: 114 mg/dL — ABNORMAL HIGH (ref 65–99)
POTASSIUM: 3.4 mmol/L — AB (ref 3.5–5.1)
Potassium: 3 mmol/L — ABNORMAL LOW (ref 3.5–5.1)
Sodium: 143 mmol/L (ref 135–145)
Sodium: 140 mmol/L (ref 135–145)

## 2016-06-05 LAB — BLOOD GAS, ARTERIAL
ACID-BASE DEFICIT: 7.8 mmol/L — AB (ref 0.0–2.0)
BICARBONATE: 16.8 meq/L — AB (ref 20.0–24.0)
Drawn by: 36277
FIO2: 40
LHR: 30 {breaths}/min
O2 Saturation: 95.2 %
PEEP/CPAP: 5 cmH2O
Patient temperature: 101
TCO2: 17.7 mmol/L (ref 0–100)
VT: 400 mL
pCO2 arterial: 33.6 mmHg — ABNORMAL LOW (ref 35.0–45.0)
pH, Arterial: 7.327 — ABNORMAL LOW (ref 7.350–7.450)
pO2, Arterial: 81.1 mmHg (ref 80.0–100.0)

## 2016-06-05 LAB — CBC
HCT: 40.1 % (ref 36.0–46.0)
Hemoglobin: 13.5 g/dL (ref 12.0–15.0)
MCH: 28.4 pg (ref 26.0–34.0)
MCHC: 33.7 g/dL (ref 30.0–36.0)
MCV: 84.4 fL (ref 78.0–100.0)
PLATELETS: 122 10*3/uL — AB (ref 150–400)
RBC: 4.75 MIL/uL (ref 3.87–5.11)
RDW: 14.3 % (ref 11.5–15.5)
WBC: 32 10*3/uL — AB (ref 4.0–10.5)

## 2016-06-05 LAB — APTT
aPTT: 77 seconds — ABNORMAL HIGH (ref 24–36)
aPTT: 81 seconds — ABNORMAL HIGH (ref 24–36)
aPTT: 84 seconds — ABNORMAL HIGH (ref 24–36)

## 2016-06-05 LAB — URINE CULTURE

## 2016-06-05 LAB — PHOSPHORUS: PHOSPHORUS: 5.5 mg/dL — AB (ref 2.5–4.6)

## 2016-06-05 MED ORDER — METOPROLOL TARTRATE 5 MG/5ML IV SOLN
2.5000 mg | Freq: Four times a day (QID) | INTRAVENOUS | Status: DC
  Administered 2016-06-05 – 2016-06-06 (×4): 2.5 mg via INTRAVENOUS
  Filled 2016-06-05 (×5): qty 5

## 2016-06-05 MED ORDER — LEVOTHYROXINE SODIUM 100 MCG IV SOLR
37.0000 ug | Freq: Every day | INTRAVENOUS | Status: DC
  Administered 2016-06-05 – 2016-06-08 (×4): 37 ug via INTRAVENOUS
  Filled 2016-06-05 (×4): qty 5

## 2016-06-05 MED ORDER — POTASSIUM CHLORIDE 10 MEQ/50ML IV SOLN
10.0000 meq | INTRAVENOUS | Status: AC
  Administered 2016-06-05 (×4): 10 meq via INTRAVENOUS
  Filled 2016-06-05 (×4): qty 50

## 2016-06-05 MED ORDER — SODIUM CHLORIDE 0.9 % IV BOLUS (SEPSIS): 1000.0000 mL | Freq: Once | INTRAVENOUS | Status: AC

## 2016-06-05 MED ORDER — PANTOPRAZOLE SODIUM 40 MG IV SOLR
40.0000 mg | Freq: Two times a day (BID) | INTRAVENOUS | Status: DC
  Administered 2016-06-05 – 2016-06-08 (×7): 40 mg via INTRAVENOUS
  Filled 2016-06-05 (×8): qty 40

## 2016-06-05 MED ORDER — WHITE PETROLATUM GEL
Status: AC
  Administered 2016-06-05: 0.2
  Filled 2016-06-05: qty 1

## 2016-06-05 MED ORDER — PHENYLEPHRINE HCL 10 MG/ML IJ SOLN
0.0000 ug/min | INTRAVENOUS | Status: DC
  Administered 2016-06-05: 25 ug/min via INTRAVENOUS
  Filled 2016-06-05 (×2): qty 1

## 2016-06-05 MED ORDER — METOPROLOL TARTRATE 5 MG/5ML IV SOLN
2.5000 mg | INTRAVENOUS | Status: DC | PRN
  Administered 2016-06-05 – 2016-06-06 (×2): 5 mg via INTRAVENOUS
  Filled 2016-06-05 (×2): qty 5

## 2016-06-05 MED ORDER — SODIUM CHLORIDE 0.9 % IV SOLN
6.0000 g | Freq: Once | INTRAVENOUS | Status: AC
  Administered 2016-06-05: 6 g via INTRAVENOUS
  Filled 2016-06-05: qty 12

## 2016-06-05 MED ORDER — SODIUM CHLORIDE 0.9 % IV BOLUS (SEPSIS)
1000.0000 mL | Freq: Once | INTRAVENOUS | Status: AC
  Administered 2016-06-05: 1000 mL via INTRAVENOUS

## 2016-06-05 NOTE — Progress Notes (Signed)
[place] for Bivalirudin  Indication: atrial fibrillation   Allergies  Allergen Reactions  . Hornet Venom Anaphylaxis  . Pork-Derived Products Anaphylaxis  . Chlorhexidine Itching    Reports that it is the dye in it  . Enoxaparin Sodium     REACTION: thrombocytopenia  . Heparin     REACTION: thrombocytopenia  . Lactose Intolerance (Gi)   . Other     Sensitive to dye in Betadine & Chlorohexadine   . Povidone-Iodine     Sensitivity- but if its wiped off she is able to tolerate betadine   . Indomethacin Rash  . Phenylpropanolamine Rash    Other Reaction: other reaction  . Povidone Iodine Rash    Patient Measurements: Height: 5' (152.4 cm) Weight: 167 lb 5.3 oz (75.9 kg) IBW/kg (Calculated) : 45.5  Vital Signs: Temp: 99.3 F (37.4 C) (08/15 1015) Temp Source: Core (Comment) (08/15 0400) BP: 121/59 (08/15 1015) Pulse Rate: 127 (08/15 1015)  Labs:  Recent Labs  06/04/16 0045 06/04/16 0520 06/04/16 1314 06/04/16 2047 06/05/16 0003 06/05/16 0443  HGB 16.0*  --   --   --   --  13.5  HCT 47.9*  --   --   --   --  40.1  PLT 283  --   --   --   --  122*  APTT  --   --   --  69* 81* 84*  LABPROT  --   --  19.0*  --   --   --   INR  --   --  1.58  --   --   --   CREATININE 0.68 0.68  --   --   --  1.10*  TROPONINI <0.03  --   --   --   --   --     Estimated Creatinine Clearance: 44 mL/min (by C-G formula based on SCr of 1.1 mg/dL).   Assessment: Patient is a 70 yo female presented 8/14 with sepsis with presumed biliary origin. She was on rivaroxaban PTA for AFib with last dose 8/13PM. She also has h/o APS w/ h/o DVT/PE. She has documented history of HIT from both UFH and enoxaparin. LFTs and T bili were elevated on admission, therefore not using argatroban.   She was started on bivalirudin after [place] perc chole was completed.  Patient's aPTT was on high end of therapeutic range today at 0.84 seconds on bivalirudin 0.1mg /kg/hr  (7.5mg /hr). No bleeding noted. Will continue current rate and recheck aPTT at 12 hours to ensure therapeutic range. Continue every 12 hour aPTT levels while patient is in the ICU.  Goal of Therapy:  Goal aPTT: 50-85 seconds Monitor platelets by anticoagulation protocol: Yes   Plan:  -Continue Bivalirudin 0.1mg /kg/hr (7.5 mg/h). -F/u aPTT levels every 12 hours while in ICU. -Monitor daily CBC, renal function, and s/sx of bleeding.   Demetrius Charity, PharmD Acute Care Pharmacy Resident  Pager: [phone] 06/05/2016

## 2016-06-05 NOTE — Progress Notes (Signed)
[place] Progress Note Patient Name: Latoya [name] [facility] DOB: 1946/03/09 MRN: [id number]   Date of Service  06/05/2016  HPI/Events of Note  AFRVR. Has prior hx of same  eICU Interventions  EKG now Change NE to PE PRN metoprolol to maintain HR < 125/min     Intervention Category Major Interventions: Arrhythmia - evaluation and management  Wilhelmina Mcardle 06/05/2016, 6:59 PM

## 2016-06-05 NOTE — Progress Notes (Signed)
Subjective: On vent awake   Objective: Vital signs in last 24 hours: Temp:  [99.5 F (37.5 C)-102.2 F (39 C)] 99.5 F (37.5 C) (08/15 0930) Pulse Rate:  [26-143] 140 (08/15 0930) Resp:  [23-38] 30 (08/15 0930) BP: (67-138)/(46-105) 129/57 (08/15 0930) SpO2:  [90 %-100 %] 97 % (08/15 0930) FiO2 (%):  [30 %-50 %] 30 % (08/15 0930) Weight:  [75.9 kg (167 lb 5.3 oz)] 75.9 kg (167 lb 5.3 oz) (08/15 0215) Last BM Date: 06/03/16  Intake/Output from previous day: 08/14 0701 - 08/15 0700 In: 4158.9 [I.V.:2106.4; NG/GT:240; IV Piggyback:1812.5] Out: 3096 [Urine:883; Emesis/NG output:2150; Drains:63] Intake/Output this shift: Total I/O In: 306.2 [I.V.:156.2; IV Piggyback:150] Out: 65 [Urine:65]  GI: percutaneous drainsoft NT abdomen  No peritonitis  Lab Results:   Recent Labs  06/04/16 0045 06/05/16 0443  WBC 15.9* 32.0*  HGB 16.0* 13.5  HCT 47.9* 40.1  PLT 283 122*   BMET  Recent Labs  06/04/16 0520 06/05/16 0443  NA 139 143  K 3.2* 3.0*  CL 105 109  CO2 22 19*  GLUCOSE 159* 114*  BUN 13 26*  CREATININE 0.68 1.10*  CALCIUM 8.9 8.0*   PT/INR  Recent Labs  06/04/16 1314  LABPROT 19.0*  INR 1.58   ABG  Recent Labs  06/04/16 0830 06/05/16 0354  PHART 7.294* 7.327*  HCO3 17.0* 16.8*    Studies/Results: Dg Chest 2 View  Result Date: 06/04/2016 CLINICAL DATA:  Mid chest pain with shortness of breath and chills tonight. EXAM: CHEST  2 VIEW COMPARISON:  12/30/2014 FINDINGS: Postoperative changes in the cervical and thoracolumbar spine. Thoracolumbar scoliosis. Shallow inspiration. Heart size is obscured by the abdominal contents but does not appear enlarged grossly. Linear scarring in the right mid and lower lung appears similar to previous study. Elevation of the right hemidiaphragm. No focal consolidation or edema. No blunting of costophrenic angles. No pneumothorax. IMPRESSION: No evidence of active pulmonary disease. Stable appearance of chronic  changes since previous study. Electronically Signed   By: Lucienne Capers M.D.   On: 06/04/2016 00:32   Ir Perc Cholecystostomy  Result Date: 06/04/2016 INDICATION: Acute cholecystitis, septic shock EXAM: CHOLECYSTOSTOMY MEDICATIONS: Patient is already receiving IV antibiotics. ANESTHESIA/SEDATION: Moderate Sedation Time: None. The patient's level of consciousness and vital signs were monitored continuously by radiology nursing throughout the procedure under my direct supervision. FLUOROSCOPY TIME:  Fluoroscopy Time: None. COMPLICATIONS: None immediate. PROCEDURE: Informed written consent was obtained from the patient's family after a thorough discussion of the procedural risks, benefits and alternatives. All questions were addressed. Maximal Sterile Barrier Technique was utilized including caps, mask, sterile gowns, sterile gloves, sterile drape, hand hygiene and skin antiseptic. A timeout was performed prior to the initiation of the procedure. Previous imaging reviewed. Procedure was performed at the bedside in the ICU because of the patient's clinical status. Preliminary ultrasound performed. The distended gallbladder in the right upper quadrant was localized between the lower ribs. Under sterile conditions and local anesthesia, ultrasound percutaneous transhepatic needle access performed of the gallbladder. Needle position confirmed in the gallbladder. Images obtained for documentation. There was return of bile. Sample sent for Gram stain and culture. 018 guidewire advanced followed by the Accustick dilator set. This allowed insertion of the Accustick dilator set and exchange of the guidewire for an Amplatz guidewire. Tract dilatation performed to insert a 10 [place] drain. Catheter position confirmed with ultrasound. Syringe aspiration yielded additional bile. Catheter secured with a Prolene suture and connected to external gravity drainage bag. Sterile  dressing applied. No immediate complication. Patient  tolerated the procedure well. IMPRESSION: Successful ultrasound percutaneous transhepatic cholecystostomy. Electronically Signed   By: Jerilynn Mages.  Shick M.D.   On: 06/04/2016 16:25   Dg Chest Port 1 View  Result Date: 06/05/2016 CLINICAL DATA:  Shortness of breath. EXAM: PORTABLE CHEST 1 VIEW COMPARISON:  06/04/2016. FINDINGS: Endotracheal tube, NG tube, right IJ line stable position. Heart size normal. Bibasilar atelectasis and/or infiltrates. Small bilateral pleural effusions. No pneumothorax . Right upper quadrant drainage catheter noted. Prior cervical and thoracolumbar spine fusion . IMPRESSION: 1. Lines and tubes in stable position. 2. Persistent bibasilar atelectasis and/or infiltrates. No change from prior exam. Small bilateral pleural effusions noted on today's exam. Electronically Signed   By: [name]  Register   On: 06/05/2016 07:05   Dg Chest Port 1 View  Result Date: 06/04/2016 CLINICAL DATA:  70 year old female with a history of line placement EXAM: PORTABLE CHEST 1 VIEW COMPARISON:  06/04/2016 FINDINGS: Significant right rotation the patient limits evaluation. Endotracheal tube terminates suitably above the carina, measuring 4.2 cm. Interval placement of right IJ approach central venous catheter which appears to terminate superior vena cava. No pneumothorax. Mixed interstitial and airspace opacities bilateral lungs, similar to the prior. Surgical changes of cervical region in the thoracolumbar junction, incompletely imaged. IMPRESSION: Interval placement of right IJ approach central venous catheter without complicating features. Tip appears to terminate superior vena cava. Unchanged endotracheal tube. Similar appearance of mixed interstitial and airspace disease bilateral lung bases with low lung volumes. Signed, Dulcy Fanny. Earleen Newport, DO [facility] Electronically Signed   By: Corrie Mckusick D.O.   On: 06/04/2016 14:28   Dg Chest Port 1  View  Result Date: 06/04/2016 CLINICAL DATA:  Initial evaluation status post intubation. EXAM: PORTABLE CHEST 1 VIEW COMPARISON:  Prior radiograph from 06/03/2016. FINDINGS: There has been interval placement of an endotracheal tube with tip position 2.3 cm above the carina. Enteric tube courses in the the abdomen. Cardiac and mediastinal silhouettes are stable. Persistent elevation of the right hemidiaphragm with associated right basilar atelectasis/ scarring. Probable left basilar atelectasis present as well. There is increased vascular congestion as compared to prior without frank pulmonary edema. No pneumothorax. Osseous structures unchanged.  Spinal fixation hardware noted. IMPRESSION: 1. Tip of the endotracheal to 2.3 cm above the carina. 2. Slightly increased vascular congestion as compared to prior without overt pulmonary edema. 3. Otherwise stable appearance of the chest. Electronically Signed   By: Jeannine Boga M.D.   On: 06/04/2016 06:23   Dg Abd Portable 1v  Result Date: 06/04/2016 CLINICAL DATA:  Check gastric catheter placement EXAM: PORTABLE ABDOMEN - 1 VIEW COMPARISON:  None. FINDINGS: Gastric catheter is now noted within the stomach. Significant previous orthopedic hardware is noted within the lumbosacral spine. Fractured screw is again noted extending into the ilium on the left stable from the previous exam. No acute bony abnormality is noted. No free air is seen. IMPRESSION: Gastric catheter within the stomach as described. Electronically Signed   By: Inez Catalina M.D.   On: 06/04/2016 08:29   Ct Angio Abd/pel W And/or Wo Contrast  Result Date: 06/04/2016 CLINICAL DATA:  Acute onset of generalized chest and abdominal pain. Initial encounter. EXAM: CTA ABDOMEN AND PELVIS wITHOUT AND WITH CONTRAST TECHNIQUE: Multidetector CT imaging of the abdomen and pelvis was performed using the standard protocol during bolus administration of intravenous contrast. Multiplanar reconstructed  images and MIPs were obtained and reviewed to evaluate the vascular  anatomy. CONTRAST:  100 mL of Isovue 370 IV contrast COMPARISON:  CT of the abdomen and pelvis from 11/19/2005 FINDINGS: Patchy bibasilar airspace opacities may reflect pneumonia, or possibly scarring. Bilateral breast implants are noted; there is chronic rupture of the right-sided breast implant, with leakage into the right lateral breast. The chronically ruptured appearance is stable from 2007. There is no evidence of aortic dissection. There is no evidence of aneurysmal dilatation. Mild calcification is seen along the distal abdominal aorta and its branches. The celiac trunk, superior mesenteric artery, bilateral renal arteries and inferior mesenteric artery appear patent. The inferior vena cava is grossly unremarkable in appearance. Note is made of a retroaortic left renal vein. A 7.1 cm hypodense mass is noted at the right hepatic lobe, with prominent peripheral vasculature, likely reflecting a large hemangioma. The spleen is unremarkable in appearance. The gallbladder is diffusely distended, with trace pericholecystic fluid, and dilatation of the common bile duct to 1.3 cm in diameter, concerning for distal obstruction. Prominent intrahepatic biliary ducts are noted. The pancreas and adrenal glands are grossly unremarkable in appearance. The kidneys are unremarkable in appearance. There is no evidence of hydronephrosis. No renal or ureteral stones are seen. No perinephric stranding is appreciated. No free fluid is identified. The small bowel is unremarkable in appearance. The stomach is within normal limits. No acute vascular abnormalities are seen. The appendix is not definitely characterized; there is no evidence of appendicitis. The sigmoid colon is somewhat thick walled, though this would could reflect intraluminal contents. Would correlate for any associated symptoms. The bladder is mildly distended and grossly unremarkable. The patient  is status post hysterectomy. No suspicious adnexal masses are seen. No inguinal lymphadenopathy is seen. No acute osseous abnormalities are identified. Thoracolumbar spinal fusion hardware is noted, with underlying decompression at the lower lumbar spine. Multilevel vacuum phenomenon is noted along the lower thoracic spine, with associated diffuse sclerosis. Review of the MIP images confirms the above findings. IMPRESSION: 1. No evidence of aortic dissection. No evidence of aneurysmal dilatation. Mild calcification along the distal abdominal aorta and its branches. 2. Diffuse gallbladder distention, with trace pericholecystic fluid, and dilatation of the common bile duct to 1.3 cm in diameter, concerning for distal obstruction. Intrahepatic biliary ductal dilatation noted. MRCP would be helpful for further evaluation, to assess for underlying mass. 3. Somewhat thick-walled appearance to the sigmoid colon. This could simply reflect intraluminal contents, though would correlate for any associated symptoms. 4. Patchy bibasilar airspace opacities may reflect pneumonia, or possibly scarring. 5. 7.1 cm right hepatic mass, with prominent peripheral vasculature, likely reflects a large benign hemangioma. This has increased from 4.3 cm in 2007. 6. Chronic rupture of the patient's right-sided breast implant, stable from 2007, with leakage into the right lateral breast, better characterized than in 2007. 7. Mild degenerative change along the lower thoracic spine. Electronically Signed   By: Garald Balding M.D.   On: 06/04/2016 03:26   US Abdomen Limited Ruq  Result Date: 06/04/2016 CLINICAL DATA:  Initial evaluation for EXAM: US ABDOMEN LIMITED - RIGHT UPPER QUADRANT COMPARISON:  Comparison made with concomitant CT of the abdomen and pelvis performed on the same day. FINDINGS: Gallbladder: No gallstones or wall thickening visualized. No sonographic Murphy sign noted by sonographer. Small amount of free pericholecystic  fluid noted. Common bile duct: Diameter: 7 mm.  Felt to be within normal limits for age. Liver: Complex mass measuring 7.9 x 7.8 x 8.1 cm. This is better evaluated on concomitant CT. IMPRESSION: 1.  Small amount of free pericholecystic fluid without other sonographic features to suggest acute cholecystitis. This may be related to overall volume status. No cholelithiasis or biliary dilatation. 2. 7.9 x 7.8 x 8.1 cm complex hepatic mass, better evaluated on concomitant CT the abdomen and pelvis. Electronically Signed   By: Jeannine Boga M.D.   On: 06/04/2016 03:03    Anti-infectives: Anti-infectives    Start     Dose/Rate Route Frequency Ordered Stop   06/05/16 0030  vancomycin (VANCOCIN) IVPB 750 mg/150 ml premix     750 mg 150 mL/hr over 60 Minutes Intravenous Every 12 hours 06/04/16 1135     06/04/16 1300  piperacillin-tazobactam (ZOSYN) IVPB 3.375 g     3.375 g 12.5 mL/hr over 240 Minutes Intravenous Every 8 hours 06/04/16 0423     06/04/16 1200  vancomycin (VANCOCIN) 1,500 mg in sodium chloride 0.9 % 500 mL IVPB     1,500 mg 250 mL/hr over 120 Minutes Intravenous  Once 06/04/16 1135 06/04/16 1412   06/04/16 0345  piperacillin-tazobactam (ZOSYN) IVPB 3.375 g     3.375 g 100 mL/hr over 30 Minutes Intravenous  Once 06/04/16 0344 06/04/16 0456      Assessment/Plan: Patient Active Problem List   Diagnosis Date Noted  . Ascending cholangitis 06/04/2016  . Sepsis ([place]) 06/04/2016  . Hemangioma of liver 06/04/2016  . Severe sepsis ([place]) 06/04/2016  . Cervical pseudoarthrosis ([place]) 08/31/2014  . Anaphylaxis, mild, due to wasp envenomation 04/12/2013  . Chest pain, atypical 04/11/2013  . PAD (peripheral artery disease) ([place]) 06/20/2011  . ANEMIA, IRON DEFICIENCY, MICROCYTIC 06/01/2010  . Primary hypercoagulable state ([place]) 06/01/2010  . BACK PAIN, CHRONIC 06/01/2010  . ABDOMINAL PAIN -GENERALIZED 06/01/2010  . CAROTID ARTERY DISEASE 05/25/2010  . CAD, NATIVE VESSEL 11/15/2009  .  MURMUR 11/15/2009  . CAROTID BRUIT, LEFT 11/15/2009  . HYPERTENSION 03/18/2009  . History of pulmonary embolism 03/18/2009  . PAROXYSMAL ATRIAL FIBRILLATION 03/18/2009  . DVT 03/18/2009  . Asthma 03/18/2009  . GERD 03/18/2009  . SPONDYLOSIS, LUMBAR 03/18/2009  . HYPERLIPIDEMIA 11/29/2008  . CORONARY HEART DISEASE 11/29/2008  Gallbladder drained  Needs MRI when stable Continue supportive care   LOS: 1 day    Sanjiv Castorena A. 06/05/2016

## 2016-06-05 NOTE — Progress Notes (Signed)
[place] for Bivalirudin  Indication: atrial fibrillation   Allergies  Allergen Reactions  . Hornet Venom Anaphylaxis  . Pork-Derived Products Anaphylaxis  . Chlorhexidine Itching    Reports that it is the dye in it  . Enoxaparin Sodium     REACTION: thrombocytopenia  . Heparin     REACTION: thrombocytopenia  . Lactose Intolerance (Gi)   . Other     Sensitive to dye in Betadine & Chlorohexadine   . Povidone-Iodine     Sensitivity- but if its wiped off she is able to tolerate betadine   . Indomethacin Rash  . Phenylpropanolamine Rash    Other Reaction: other reaction  . Povidone Iodine Rash    Patient Measurements: Height: 5' (152.4 cm) Weight: 167 lb 5.3 oz (75.9 kg) IBW/kg (Calculated) : 45.5  Vital Signs: Temp: 98.4 F (36.9 C) (08/15 1600) BP: 131/57 (08/15 1600) Pulse Rate: 113 (08/15 1600)  Labs:  Recent Labs  06/04/16 0045 06/04/16 0520 06/04/16 1314  06/05/16 0003 06/05/16 0443 06/05/16 1700  HGB 16.0*  --   --   --   --  13.5  --   HCT 47.9*  --   --   --   --  40.1  --   PLT 283  --   --   --   --  122*  --   APTT  --   --   --   < > 81* 84* 77*  LABPROT  --   --  19.0*  --   --   --   --   INR  --   --  1.58  --   --   --   --   CREATININE 0.68 0.68  --   --   --  1.10*  --   TROPONINI <0.03  --   --   --   --   --   --   < > = values in this interval not displayed.  Estimated Creatinine Clearance: 44 mL/min (by C-G formula based on SCr of 1.1 mg/dL).   Assessment: Patient is a 70 yo female who presented 8/14 with sepsis of presumed biliary origin. She was on rivaroxaban PTA for AFib with last dose 8/13PM. She also has h/o APS w/ h/o DVT/PE. She has documented history of HIT from both UFH and enoxaparin. LFTs and T bili were elevated on admission, therefore not using argatroban.   She was started on bivalirudin after [place] perc chole was completed 8/14.  Patient's aPTT is therapeutic on bivalirudin  0.1mg /kg/hr (7.5mg /hr). No bleeding noted. Will continue current rate and recheck aPTT every 12 hour aPTT levels while patient is in the ICU.  Goal of Therapy:  Goal aPTT: 50-85 seconds Monitor platelets by anticoagulation protocol: Yes   Plan:  -Continue Bivalirudin 0.1mg /kg/hr (7.5 mg/h). -F/u aPTT levels every 12 hours while in ICU. -Monitor daily CBC, renal function, and s/sx of bleeding.   [employer], Pharm.D., BCPS Clinical Pharmacist Pager [phone] 06/05/2016 6:58 PM

## 2016-06-05 NOTE — Progress Notes (Signed)
[place] for Bivalirudin  Indication: atrial fibrillation   Allergies  Allergen Reactions  . Hornet Venom Anaphylaxis  . Pork-Derived Products Anaphylaxis  . Chlorhexidine Itching    Reports that it is the dye in it  . Enoxaparin Sodium     REACTION: thrombocytopenia  . Heparin     REACTION: thrombocytopenia  . Lactose Intolerance (Gi)   . Other     Sensitive to dye in Betadine & Chlorohexadine   . Povidone-Iodine     Sensitivity- but if its wiped off she is able to tolerate betadine   . Indomethacin Rash  . Phenylpropanolamine Rash    Other Reaction: other reaction  . Povidone Iodine Rash    Patient Measurements: Height: 5' (152.4 cm) Weight: 165 lb 5.5 oz (75 kg) IBW/kg (Calculated) : 45.5  Vital Signs: Temp: 101.1 F (38.4 C) (08/15 0045) Temp Source: Core (Comment) (08/15 0000) BP: 130/59 (08/15 0045) Pulse Rate: 117 (08/15 0045)  Labs:  Recent Labs  06/04/16 0045 06/04/16 0520 06/04/16 1314 06/04/16 2047 06/05/16 0003  HGB 16.0*  --   --   --   --   HCT 47.9*  --   --   --   --   PLT 283  --   --   --   --   APTT  --   --   --  69* 81*  LABPROT  --   --  19.0*  --   --   INR  --   --  1.58  --   --   CREATININE 0.68 0.68  --   --   --   TROPONINI <0.03  --   --   --   --     Estimated Creatinine Clearance: 60 mL/min (by C-G formula based on SCr of 0.8 mg/dL).   Assessment: Patient is a 70 yo female presented 8/14 with sepsis with presumed biliary origin. She was on rivaroxaban PTA for AFib with last dose 8/13PM. She also has h/o APS w/ h/o DVT/PE. She has documented history of HIT from both UFH and enoxaparin. LFTs and T bili were elevated on admission, therefore not using argatroban.   She was started on bivalirudin after [place] perc chole was completed. aPTT therapeutic x 2 on bivalirudin 0.1mg /kg/hr (7.5mg /hr). No bleeding noted.  Goal of Therapy:  Goal aPTT: 50-85 seconds Monitor platelets by  anticoagulation protocol: Yes   Plan:  -Continue Bivalirudin 0.1mg /kg/hr (7.5 mg/h) -F/u daily PTT  -Monitor CBC, renal function, and s/sx of bleeding.   Sherlon Handing, PharmD, BCPS Clinical pharmacist, pager [phone] 06/05/2016 12:59 AM

## 2016-06-05 NOTE — Progress Notes (Signed)
PULMONARY / CRITICAL CARE MEDICINE   Name: Latoya [name] MRN: [id number] DOB: 1946-03-29    ADMISSION DATE:  06/04/2016  CHIEF COMPLAINT:  Abdominal Pain  HISTORY OF PRESENT ILLNESS:   Latoya [name] is a 70 y/o woman with MMP, most notable for antiphospholipid syndrome on AC (hx of one provoked DVT), as well as CAD and asthma who presented to the ED with sudden onset of chills and abdominal pain. In the ED her clinical status declined, becoming less responsive, more tachycardic and more tachypneic. Imaging studies suggested an enlarged gallbladder.  SUBJECTIVE:  Deteriorating BP and O2 demand overnight.  VITAL SIGNS: BP (!) 129/57   Pulse (!) 140   Temp 99.5 F (37.5 C)   Resp (!) 30   Ht 5' (1.524 m)   Wt 75.9 kg (167 lb 5.3 oz)   SpO2 97%   BMI 32.68 kg/m    HEMODYNAMICS: CVP:  [0 mmHg-4 mmHg] 3 mmHg  VENTILATOR SETTINGS: Vent Mode: PRVC FiO2 (%):  [30 %-50 %] 30 % Set Rate:  [30 bmp] 30 bmp Vt Set:  [400 mL] 400 mL PEEP:  [5 cmH20] 5 cmH20 Pressure Support:  [8 cmH20] 8 cmH20 Plateau Pressure:  [16 cmH20-19 cmH20] 16 cmH20  INTAKE / OUTPUT: I/O last 3 completed shifts: In: 6160.7 [I.V.:4108.2; NG/GT:240; IV Piggyback:1812.5] Out: 5170 [YFVCB:449; Emesis/NG output:2550; Drains:63]  PHYSICAL EXAMINATION: General:  Elderly woman, chronically ill appearing, awake and interactive on vent. Neuro:  Awake and interactive, moving all ext to command. HEENT:  ETT, [place]/AT, PERRL, EOM-spontaneous. Cardiovascular:  Tachycardic, sinus, Nl S1/S2, -M/R/G. Lungs:  Coarse BS diffusely. Abdomen:  Obese, NT, ND and +BS, unresponsive. Musculoskeletal:  No joint swelling Skin:  No visible rashes  LABS:  BMET  Recent Labs Lab 06/04/16 0045 06/04/16 0520 06/05/16 0443  NA 134* 139 143  K 4.0 3.2* 3.0*  CL 98* 105 109  CO2 21* 22 19*  BUN 14 13 26*  CREATININE 0.68 0.68 1.10*  GLUCOSE 171* 159* 114*   Electrolytes  Recent Labs Lab 06/04/16 0045 06/04/16 0520  06/05/16 0443  CALCIUM 9.6 8.9 8.0*  MG  --   --  1.3*  PHOS  --   --  5.5*   CBC  Recent Labs Lab 06/04/16 0045 06/05/16 0443  WBC 15.9* 32.0*  HGB 16.0* 13.5  HCT 47.9* 40.1  PLT 283 122*   Coag's  Recent Labs Lab 06/04/16 1314 06/04/16 2047 06/05/16 0003 06/05/16 0443  APTT  --  69* 81* 84*  INR 1.58  --   --   --    Sepsis Markers  Recent Labs Lab 06/04/16 0103 06/04/16 0332 06/04/16 0513  LATICACIDVEN 6.88* 6.06* 6.95*   ABG  Recent Labs Lab 06/04/16 0517 06/04/16 0830 06/05/16 0354  PHART 7.339* 7.294* 7.327*  PCO2ART 32.9* 37.6 33.6*  PO2ART 72.0* 226* 81.1   Liver Enzymes  Recent Labs Lab 06/04/16 0045 06/04/16 0520  AST 500* 752*  ALT 291* 486*  ALKPHOS 138* 129*  BILITOT 2.7* 2.5*  ALBUMIN 4.5 3.4*   Cardiac Enzymes  Recent Labs Lab 06/04/16 0045  TROPONINI <0.03   Glucose  Recent Labs Lab 06/04/16 0656 06/04/16 1945 06/05/16 0003 06/05/16 0408 06/05/16 0754  GLUCAP 138* 79 99 116* 85   Imaging Ir Perc Cholecystostomy  Result Date: 06/04/2016 INDICATION: Acute cholecystitis, septic shock EXAM: CHOLECYSTOSTOMY MEDICATIONS: Patient is already receiving IV antibiotics. ANESTHESIA/SEDATION: Moderate Sedation Time: None. The patient's level of consciousness and vital signs were monitored continuously by radiology  nursing throughout the procedure under my direct supervision. FLUOROSCOPY TIME:  Fluoroscopy Time: None. COMPLICATIONS: None immediate. PROCEDURE: Informed written consent was obtained from the patient's family after a thorough discussion of the procedural risks, benefits and alternatives. All questions were addressed. Maximal Sterile Barrier Technique was utilized including caps, mask, sterile gowns, sterile gloves, sterile drape, hand hygiene and skin antiseptic. A timeout was performed prior to the initiation of the procedure. Previous imaging reviewed. Procedure was performed at the bedside in the ICU because of the  patient's clinical status. Preliminary ultrasound performed. The distended gallbladder in the right upper quadrant was localized between the lower ribs. Under sterile conditions and local anesthesia, ultrasound percutaneous transhepatic needle access performed of the gallbladder. Needle position confirmed in the gallbladder. Images obtained for documentation. There was return of bile. Sample sent for Gram stain and culture. 018 guidewire advanced followed by the Accustick dilator set. This allowed insertion of the Accustick dilator set and exchange of the guidewire for an Amplatz guidewire. Tract dilatation performed to insert a 10 [place] drain. Catheter position confirmed with ultrasound. Syringe aspiration yielded additional bile. Catheter secured with a Prolene suture and connected to external gravity drainage bag. Sterile dressing applied. No immediate complication. Patient tolerated the procedure well. IMPRESSION: Successful ultrasound percutaneous transhepatic cholecystostomy. Electronically Signed   By: Jerilynn Mages.  Shick M.D.   On: 06/04/2016 16:25   Dg Chest Port 1 View  Result Date: 06/05/2016 CLINICAL DATA:  Shortness of breath. EXAM: PORTABLE CHEST 1 VIEW COMPARISON:  06/04/2016. FINDINGS: Endotracheal tube, NG tube, right IJ line stable position. Heart size normal. Bibasilar atelectasis and/or infiltrates. Small bilateral pleural effusions. No pneumothorax . Right upper quadrant drainage catheter noted. Prior cervical and thoracolumbar spine fusion . IMPRESSION: 1. Lines and tubes in stable position. 2. Persistent bibasilar atelectasis and/or infiltrates. No change from prior exam. Small bilateral pleural effusions noted on today's exam. Electronically Signed   By: [name]  Register   On: 06/05/2016 07:05   Dg Chest Port 1 View  Result Date: 06/04/2016 CLINICAL DATA:  70 year old female with a history of line placement EXAM: PORTABLE CHEST 1 VIEW COMPARISON:  06/04/2016 FINDINGS: Significant right  rotation the patient limits evaluation. Endotracheal tube terminates suitably above the carina, measuring 4.2 cm. Interval placement of right IJ approach central venous catheter which appears to terminate superior vena cava. No pneumothorax. Mixed interstitial and airspace opacities bilateral lungs, similar to the prior. Surgical changes of cervical region in the thoracolumbar junction, incompletely imaged. IMPRESSION: Interval placement of right IJ approach central venous catheter without complicating features. Tip appears to terminate superior vena cava. Unchanged endotracheal tube. Similar appearance of mixed interstitial and airspace disease bilateral lung bases with low lung volumes. Signed, Dulcy Fanny. Earleen Newport, DO [facility] Electronically Signed   By: Corrie Mckusick D.O.   On: 06/04/2016 14:28   STUDIES:  CTA ABD with enlarged galbladder and enlarging hepatic cyst. RUQ [place] noted  CULTURES: Blood 8/14 >> Urine 8/14 >>  ANTIBIOTICS: Vanc 8/14 >> Zosyn 8/14 >>  SIGNIFICANT EVENTS: Intubated in ED after initial evaluation  LINES/TUBES: ETT 7.5 mm 8/14 >> Foley 8/14 >> R IJ TLC 8/14>>>  DISCUSSION: 70 y/o woman with severe sepsis of presumed biliary origin  ASSESSMENT / PLAN:  PULMONARY A: Need for Mechanical Ventilation Moderate persistent asthma P:   Maintain high minute ventilation. ABG in AM. Nebulized budesonide, ipratropium, and albuterol for asthma.  CARDIOVASCULAR A:  CAD. HTN. PAF. Evolving septic shock. Sinus tach,  likely hypovolemia as well. P:  CVP 3, will give a liter bolus. Levophed at 2, titrate down. Stress dose steroids. Hold all anti-HTN. NS bolus, if remains tachy then will consider beta blockers.  RENAL A:   Lactic Acidosis P:   Replace electrolytes as indicated. Check CVP. NS 75 ml/hr. NS 1L bolus.  GASTROINTESTINAL A:   Abnormal LFTs Probable cholangitis P:   Drain placed in  by IR. NPO. TPN on 8/16 if remains NPO by AM.  HEMATOLOGIC A:   Hx of Antiphospholipid Ab Syndrome Hx of provoked DVT On rivaroxaban Hx of allergy to Heparin products P:  Hold rivaroxaban in anticipation of possible PTC Continue argatroban once PTC is in place.  INFECTIOUS A:   Severe sepsis, probable biliary origin P:   Continue abx. PTC per IR Awaiting surgery's input.  ENDOCRINE A:   Hypothyroidism P:   Cont home syndroid. Cortisol level. Stress dose steroids.  NEUROLOGIC A:   Need for sedation P:   D/C propfol. RASS goal: 0  FAMILY  - Updates: No family bedside.  - Inter-disciplinary family meet or Palliative Care meeting due by:  8/21  The patient is critically ill with multiple organ systems failure and requires high complexity decision making for assessment and support, frequent evaluation and titration of therapies, application of advanced monitoring technologies and extensive interpretation of multiple databases.   Critical Care Time devoted to patient care services described in this note is  35  Minutes. This time reflects time of care of this signee Dr Jennet Maduro. This critical care time does not reflect procedure time, or teaching time or supervisory time of PA/NP/Med student/Med Resident etc but could involve care discussion time.  Rush Farmer, M.D. [facility] Pulmonary/Critical Care Medicine. Pager: [phone]. After hours pager: [phone].  06/05/2016, 9:41 AM

## 2016-06-05 NOTE — Progress Notes (Signed)
Referring Physician(s): Dr Jennet Maduro  Supervising Physician: Aletta Edouard  Patient Status:  Inpatient  Chief Complaint:  Percutaneous chole drain placed 8/14  Subjective:  On vent Awake Tried to wean----not tolerated Can nod yes/ no  Allergies: Hornet venom; Pork-derived products; Chlorhexidine; Enoxaparin sodium; Heparin; Lactose intolerance (gi); Other; Povidone-iodine; Indomethacin; Phenylpropanolamine; and Povidone iodine  Medications: Prior to Admission medications   Medication Sig Start Date End Date Taking? Authorizing Provider  albuterol (PROVENTIL HFA;VENTOLIN HFA) 108 (90 BASE) MCG/ACT inhaler Inhale 2 puffs into the lungs every 6 (six) hours as needed for wheezing or shortness of breath.    Yes Historical Provider, MD  aspirin 81 MG EC tablet Take 81 mg by mouth daily.    Yes Historical Provider, MD  atorvastatin (LIPITOR) 40 MG tablet Take 40 mg by mouth daily.   Yes Historical Provider, MD  azelastine (ASTELIN) 137 MCG/SPRAY nasal spray Place 1 spray into the nose 2 (two) times daily. Use in each nostril as directed For allergies   Yes Historical Provider, MD  beclomethasone (QVAR) 80 MCG/ACT inhaler Inhale 2 puffs into the lungs 2 (two) times daily. Patient taking differently: Inhale 2 puffs into the lungs 3 (three) times daily.  06/24/14  Yes Elsie Stain, MD  Calcium Carb-Cholecalciferol (CALCIUM 500 +D) 500-400 MG-UNIT TABS Take 1 tablet by mouth 2 (two) times daily.   Yes Historical Provider, MD  cyclobenzaprine (FLEXERIL) 10 MG tablet Take 10 mg by mouth 3 (three) times daily as needed. For severe muscle pain   Yes Historical Provider, MD  diazepam (VALIUM) 5 MG tablet Take 5 mg by mouth at bedtime as needed for anxiety or sedation.    Yes Historical Provider, MD  docusate sodium (COLACE) 100 MG capsule Take 200 mg by mouth 2 (two) times daily.    Yes Historical Provider, MD  EPINEPHrine (EPIPEN) 0.3 mg/0.3 mL DEVI Inject 0.3 mLs (0.3 mg total) into  the muscle once. Patient taking differently: Inject 0.3 mg into the muscle once as needed.  04/12/13  Yes Christina P Rama, MD  esomeprazole (NEXIUM) 40 MG capsule Take 40 mg by mouth 2 (two) times daily.    Yes Historical Provider, MD  FentaNYL 37.5 MCG/HR PT72 Place 1 patch onto the skin every 3 (three) days.   Yes Historical Provider, MD  Glucosamine-Chondroitin 750-600 MG TABS Take 1 tablet by mouth 2 (two) times daily.   Yes Historical Provider, MD  HYDROmorphone (DILAUDID) 4 MG tablet Take 4-8 mg by mouth every 4 (four) hours as needed. For pain   Yes Historical Provider, MD  levothyroxine (SYNTHROID, LEVOTHROID) 75 MCG tablet Take 75 mcg by mouth daily before breakfast.    Yes Historical Provider, MD  lidocaine (LIDODERM) 5 % Place 1 patch onto the skin daily. Remove & Discard patch within 12 hours or as directed by MD   Yes Historical Provider, MD  Loratadine-Pseudoephedrine (CLARITIN-D 12 HOUR PO) Take 1 tablet by mouth 2 (two) times daily.    Yes Historical Provider, MD  Melatonin 2.5 MG CAPS Take 2.5-5 mg by mouth at bedtime.   Yes Historical Provider, MD  metaxalone (SKELAXIN) 800 MG tablet Take 800 mg by mouth every 8 (eight) hours as needed. For muscle pain   Yes Historical Provider, MD  metoprolol (LOPRESSOR) 50 MG tablet Take 1 tablet (50 mg total) by mouth 2 (two) times daily. 11/09/15  Yes Burtis Junes, NP  montelukast (SINGULAIR) 10 MG tablet Take 10 mg by mouth at bedtime.  Yes Historical Provider, MD  Multiple Vitamin (MULTIVITAMIN WITH MINERALS) TABS Take 1 tablet by mouth daily.   Yes Historical Provider, MD  NITROSTAT 0.4 MG SL tablet DISSOLVE 1 TABLET UNDER TONGUE AS NEEDED FOR CHEST PAIN,MAY REPEAT IN5 MINUTES FOR 2 DOSES. 02/24/16  Yes Burnell Blanks, MD  Respiratory Therapy Supplies (FLUTTER) DEVI Use as directed 06/06/15  Yes Tanda Rockers, MD  rivaroxaban (XARELTO) 20 MG TABS tablet Take 1 tablet (20 mg total) by mouth daily with supper. 12/21/15  Yes Burnell Blanks, MD  triamcinolone (NASACORT) 55 MCG/ACT nasal inhaler Place 1-2 sprays into the nose 2 (two) times daily.    Yes Historical Provider, MD  atorvastatin (LIPITOR) 20 MG tablet Take 1 tablet (20 mg total) by mouth daily. Patient not taking: Reported on 06/04/2016 11/09/15   Burtis Junes, NP     Vital Signs: BP (!) 123/51   Pulse (!) 124   Temp 99.1 F (37.3 C)   Resp (!) 30   Ht 5' (1.524 m)   Wt 167 lb 5.3 oz (75.9 kg)   SpO2 92%   BMI 32.68 kg/m   Physical Exam  Skin: Skin is warm and dry.  Skin site clean and dry Sl tender No bleeding Output 65 cc yesterday 20 cc in bag; greenish output   Nursing note and vitals reviewed.  Wbc up to 32 today  Imaging: Dg Chest 2 View  Result Date: 06/04/2016 CLINICAL DATA:  Mid chest pain with shortness of breath and chills tonight. EXAM: CHEST  2 VIEW COMPARISON:  12/30/2014 FINDINGS: Postoperative changes in the cervical and thoracolumbar spine. Thoracolumbar scoliosis. Shallow inspiration. Heart size is obscured by the abdominal contents but does not appear enlarged grossly. Linear scarring in the right mid and lower lung appears similar to previous study. Elevation of the right hemidiaphragm. No focal consolidation or edema. No blunting of costophrenic angles. No pneumothorax. IMPRESSION: No evidence of active pulmonary disease. Stable appearance of chronic changes since previous study. Electronically Signed   By: Lucienne Capers M.D.   On: 06/04/2016 00:32   Ir Perc Cholecystostomy  Result Date: 06/04/2016 INDICATION: Acute cholecystitis, septic shock EXAM: CHOLECYSTOSTOMY MEDICATIONS: Patient is already receiving IV antibiotics. ANESTHESIA/SEDATION: Moderate Sedation Time: None. The patient's level of consciousness and vital signs were monitored continuously by radiology nursing throughout the procedure under my direct supervision. FLUOROSCOPY TIME:  Fluoroscopy Time: None. COMPLICATIONS: None immediate. PROCEDURE: Informed  written consent was obtained from the patient's family after a thorough discussion of the procedural risks, benefits and alternatives. All questions were addressed. Maximal Sterile Barrier Technique was utilized including caps, mask, sterile gowns, sterile gloves, sterile drape, hand hygiene and skin antiseptic. A timeout was performed prior to the initiation of the procedure. Previous imaging reviewed. Procedure was performed at the bedside in the ICU because of the patient's clinical status. Preliminary ultrasound performed. The distended gallbladder in the right upper quadrant was localized between the lower ribs. Under sterile conditions and local anesthesia, ultrasound percutaneous transhepatic needle access performed of the gallbladder. Needle position confirmed in the gallbladder. Images obtained for documentation. There was return of bile. Sample sent for Gram stain and culture. 018 guidewire advanced followed by the Accustick dilator set. This allowed insertion of the Accustick dilator set and exchange of the guidewire for an Amplatz guidewire. Tract dilatation performed to insert a 10 [place] drain. Catheter position confirmed with ultrasound. Syringe aspiration yielded additional bile. Catheter secured with a Prolene suture and connected to external  gravity drainage bag. Sterile dressing applied. No immediate complication. Patient tolerated the procedure well. IMPRESSION: Successful ultrasound percutaneous transhepatic cholecystostomy. Electronically Signed   By: Jerilynn Mages.  Shick M.D.   On: 06/04/2016 16:25   Dg Chest Port 1 View  Result Date: 06/05/2016 CLINICAL DATA:  Shortness of breath. EXAM: PORTABLE CHEST 1 VIEW COMPARISON:  06/04/2016. FINDINGS: Endotracheal tube, NG tube, right IJ line stable position. Heart size normal. Bibasilar atelectasis and/or infiltrates. Small bilateral pleural effusions. No pneumothorax . Right upper quadrant drainage catheter noted. Prior cervical and thoracolumbar spine  fusion . IMPRESSION: 1. Lines and tubes in stable position. 2. Persistent bibasilar atelectasis and/or infiltrates. No change from prior exam. Small bilateral pleural effusions noted on today's exam. Electronically Signed   By: [name]  Register   On: 06/05/2016 07:05   Dg Chest Port 1 View  Result Date: 06/04/2016 CLINICAL DATA:  70 year old female with a history of line placement EXAM: PORTABLE CHEST 1 VIEW COMPARISON:  06/04/2016 FINDINGS: Significant right rotation the patient limits evaluation. Endotracheal tube terminates suitably above the carina, measuring 4.2 cm. Interval placement of right IJ approach central venous catheter which appears to terminate superior vena cava. No pneumothorax. Mixed interstitial and airspace opacities bilateral lungs, similar to the prior. Surgical changes of cervical region in the thoracolumbar junction, incompletely imaged. IMPRESSION: Interval placement of right IJ approach central venous catheter without complicating features. Tip appears to terminate superior vena cava. Unchanged endotracheal tube. Similar appearance of mixed interstitial and airspace disease bilateral lung bases with low lung volumes. Signed, Dulcy Fanny. Earleen Newport, DO [facility] Electronically Signed   By: Corrie Mckusick D.O.   On: 06/04/2016 14:28   Dg Chest Port 1 View  Result Date: 06/04/2016 CLINICAL DATA:  Initial evaluation status post intubation. EXAM: PORTABLE CHEST 1 VIEW COMPARISON:  Prior radiograph from 06/03/2016. FINDINGS: There has been interval placement of an endotracheal tube with tip position 2.3 cm above the carina. Enteric tube courses in the the abdomen. Cardiac and mediastinal silhouettes are stable. Persistent elevation of the right hemidiaphragm with associated right basilar atelectasis/ scarring. Probable left basilar atelectasis present as well. There is increased vascular congestion as compared to prior without frank  pulmonary edema. No pneumothorax. Osseous structures unchanged.  Spinal fixation hardware noted. IMPRESSION: 1. Tip of the endotracheal to 2.3 cm above the carina. 2. Slightly increased vascular congestion as compared to prior without overt pulmonary edema. 3. Otherwise stable appearance of the chest. Electronically Signed   By: Jeannine Boga M.D.   On: 06/04/2016 06:23   Dg Abd Portable 1v  Result Date: 06/04/2016 CLINICAL DATA:  Check gastric catheter placement EXAM: PORTABLE ABDOMEN - 1 VIEW COMPARISON:  None. FINDINGS: Gastric catheter is now noted within the stomach. Significant previous orthopedic hardware is noted within the lumbosacral spine. Fractured screw is again noted extending into the ilium on the left stable from the previous exam. No acute bony abnormality is noted. No free air is seen. IMPRESSION: Gastric catheter within the stomach as described. Electronically Signed   By: Inez Catalina M.D.   On: 06/04/2016 08:29   Ct Angio Abd/pel W And/or Wo Contrast  Result Date: 06/04/2016 CLINICAL DATA:  Acute onset of generalized chest and abdominal pain. Initial encounter. EXAM: CTA ABDOMEN AND PELVIS wITHOUT AND WITH CONTRAST TECHNIQUE: Multidetector CT imaging of the abdomen and pelvis was performed using the standard protocol during bolus administration of intravenous contrast. Multiplanar reconstructed images and MIPs were obtained and reviewed to  evaluate the vascular anatomy. CONTRAST:  100 mL of Isovue 370 IV contrast COMPARISON:  CT of the abdomen and pelvis from 11/19/2005 FINDINGS: Patchy bibasilar airspace opacities may reflect pneumonia, or possibly scarring. Bilateral breast implants are noted; there is chronic rupture of the right-sided breast implant, with leakage into the right lateral breast. The chronically ruptured appearance is stable from 2007. There is no evidence of aortic dissection. There is no evidence of aneurysmal dilatation. Mild calcification is seen along the  distal abdominal aorta and its branches. The celiac trunk, superior mesenteric artery, bilateral renal arteries and inferior mesenteric artery appear patent. The inferior vena cava is grossly unremarkable in appearance. Note is made of a retroaortic left renal vein. A 7.1 cm hypodense mass is noted at the right hepatic lobe, with prominent peripheral vasculature, likely reflecting a large hemangioma. The spleen is unremarkable in appearance. The gallbladder is diffusely distended, with trace pericholecystic fluid, and dilatation of the common bile duct to 1.3 cm in diameter, concerning for distal obstruction. Prominent intrahepatic biliary ducts are noted. The pancreas and adrenal glands are grossly unremarkable in appearance. The kidneys are unremarkable in appearance. There is no evidence of hydronephrosis. No renal or ureteral stones are seen. No perinephric stranding is appreciated. No free fluid is identified. The small bowel is unremarkable in appearance. The stomach is within normal limits. No acute vascular abnormalities are seen. The appendix is not definitely characterized; there is no evidence of appendicitis. The sigmoid colon is somewhat thick walled, though this would could reflect intraluminal contents. Would correlate for any associated symptoms. The bladder is mildly distended and grossly unremarkable. The patient is status post hysterectomy. No suspicious adnexal masses are seen. No inguinal lymphadenopathy is seen. No acute osseous abnormalities are identified. Thoracolumbar spinal fusion hardware is noted, with underlying decompression at the lower lumbar spine. Multilevel vacuum phenomenon is noted along the lower thoracic spine, with associated diffuse sclerosis. Review of the MIP images confirms the above findings. IMPRESSION: 1. No evidence of aortic dissection. No evidence of aneurysmal dilatation. Mild calcification along the distal abdominal aorta and its branches. 2. Diffuse gallbladder  distention, with trace pericholecystic fluid, and dilatation of the common bile duct to 1.3 cm in diameter, concerning for distal obstruction. Intrahepatic biliary ductal dilatation noted. MRCP would be helpful for further evaluation, to assess for underlying mass. 3. Somewhat thick-walled appearance to the sigmoid colon. This could simply reflect intraluminal contents, though would correlate for any associated symptoms. 4. Patchy bibasilar airspace opacities may reflect pneumonia, or possibly scarring. 5. 7.1 cm right hepatic mass, with prominent peripheral vasculature, likely reflects a large benign hemangioma. This has increased from 4.3 cm in 2007. 6. Chronic rupture of the patient's right-sided breast implant, stable from 2007, with leakage into the right lateral breast, better characterized than in 2007. 7. Mild degenerative change along the lower thoracic spine. Electronically Signed   By: Garald Balding M.D.   On: 06/04/2016 03:26   US Abdomen Limited Ruq  Result Date: 06/04/2016 CLINICAL DATA:  Initial evaluation for EXAM: US ABDOMEN LIMITED - RIGHT UPPER QUADRANT COMPARISON:  Comparison made with concomitant CT of the abdomen and pelvis performed on the same day. FINDINGS: Gallbladder: No gallstones or wall thickening visualized. No sonographic Murphy sign noted by sonographer. Small amount of free pericholecystic fluid noted. Common bile duct: Diameter: 7 mm.  Felt to be within normal limits for age. Liver: Complex mass measuring 7.9 x 7.8 x 8.1 cm. This is better evaluated on concomitant  CT. IMPRESSION: 1. Small amount of free pericholecystic fluid without other sonographic features to suggest acute cholecystitis. This may be related to overall volume status. No cholelithiasis or biliary dilatation. 2. 7.9 x 7.8 x 8.1 cm complex hepatic mass, better evaluated on concomitant CT the abdomen and pelvis. Electronically Signed   By: Jeannine Boga M.D.   On: 06/04/2016 03:03     Labs:  CBC:  Recent Labs  06/04/16 0045 06/05/16 0443  WBC 15.9* 32.0*  HGB 16.0* 13.5  HCT 47.9* 40.1  PLT 283 122*    COAGS:  Recent Labs  06/04/16 1314 06/04/16 2047 06/05/16 0003 06/05/16 0443  INR 1.58  --   --   --   APTT  --  69* 81* 84*    BMP:  Recent Labs  06/04/16 0045 06/04/16 0520 06/05/16 0443  NA 134* 139 143  K 4.0 3.2* 3.0*  CL 98* 105 109  CO2 21* 22 19*  GLUCOSE 171* 159* 114*  BUN 14 13 26*  CALCIUM 9.6 8.9 8.0*  CREATININE 0.68 0.68 1.10*  GFRNONAA >60 >60 50*  GFRAA >60 >60 58*    LIVER FUNCTION TESTS:  Recent Labs  06/04/16 0045 06/04/16 0520  BILITOT 2.7* 2.5*  AST 500* 752*  ALT 291* 486*  ALKPHOS 138* 129*  PROT 7.6 5.7*  ALBUMIN 4.5 3.4*    Assessment and Plan:  Percutaneous cholecystostomy drain placed 8/14 Drain to remain 6 weeks---unless goes to OR  Electronically Signed: Corrissa Martello A 06/05/2016, 11:46 AM   I spent a total of 15 Minutes at the the patient's bedside AND on the patient's hospital floor or unit, greater than 50% of which was counseling/coordinating care for perc chole drain

## 2016-06-05 NOTE — Progress Notes (Signed)
[facility] ADULT ICU REPLACEMENT PROTOCOL FOR AM LAB REPLACEMENT ONLY  The patient does apply for the [facility] Adult ICU Electrolyte Replacment Protocol based on the criteria listed below:   1. Is GFR >/= 40 ml/min? Yes.    Patient's GFR today is 50 2. Is urine output >/= 0.5 ml/kg/hr for the last 6 hours? Yes.   Patient's UOP is 0.51 ml/kg/hr 3. Is BUN < 60 mg/dL? Yes.    Patient's BUN today is 26 4. Abnormal electrolyte K 3.0 5. Ordered repletion with: per protocol 6. If a panic level lab has been reported, has the CCM MD in charge been notified? Yes.  .   Physician:  Illene Labrador, Canary Brim 06/05/2016 5:44 AM

## 2016-06-05 NOTE — Care Management Note (Signed)
Case Management Note  Patient Details  Name: Latoya [name] MRN: [id number] Date of Birth: [birth year]  Subjective/Objective:      Pt admitted with severe sepsis of presumed biliary origin             Action/Plan:  PTA from home with husband.  CM will continue to follow for discharge needs   Expected Discharge Date:                  Expected Discharge Plan:     In-House Referral:     Discharge planning Services  CM Consult  Post Acute Care Choice:    Choice offered to:     DME Arranged:    DME Agency:     HH Arranged:    HH Agency:     Status of Service:  In process, will continue to follow  If discussed at Long Length of Stay Meetings, dates discussed:    Additional Comments:  Maryclare Labrador, RN 06/05/2016, 11:45 AM

## 2016-06-06 ENCOUNTER — Inpatient Hospital Stay (HOSPITAL_COMMUNITY): Payer: Medicare Other

## 2016-06-06 DIAGNOSIS — R6521 Severe sepsis with septic shock: Secondary | ICD-10-CM

## 2016-06-06 DIAGNOSIS — A419 Sepsis, unspecified organism: Secondary | ICD-10-CM | POA: Diagnosis present

## 2016-06-06 DIAGNOSIS — B9620 Unspecified Escherichia coli [E. coli] as the cause of diseases classified elsewhere: Secondary | ICD-10-CM

## 2016-06-06 DIAGNOSIS — B961 Klebsiella pneumoniae [K. pneumoniae] as the cause of diseases classified elsewhere: Secondary | ICD-10-CM

## 2016-06-06 DIAGNOSIS — Z938 Other artificial opening status: Secondary | ICD-10-CM

## 2016-06-06 DIAGNOSIS — J9601 Acute respiratory failure with hypoxia: Secondary | ICD-10-CM

## 2016-06-06 DIAGNOSIS — Z9911 Dependence on respirator [ventilator] status: Secondary | ICD-10-CM

## 2016-06-06 DIAGNOSIS — R7881 Bacteremia: Secondary | ICD-10-CM | POA: Diagnosis present

## 2016-06-06 DIAGNOSIS — B952 Enterococcus as the cause of diseases classified elsewhere: Secondary | ICD-10-CM | POA: Diagnosis present

## 2016-06-06 LAB — POCT I-STAT 3, ART BLOOD GAS (G3+)
Acid-base deficit: 5 mmol/L — ABNORMAL HIGH (ref 0.0–2.0)
BICARBONATE: 17.2 meq/L — AB (ref 20.0–24.0)
O2 SAT: 93 %
PCO2 ART: 22.1 mmHg — AB (ref 35.0–45.0)
PO2 ART: 59 mmHg — AB (ref 80.0–100.0)
Patient temperature: 98.6
TCO2: 18 mmol/L (ref 0–100)
pH, Arterial: 7.498 — ABNORMAL HIGH (ref 7.350–7.450)

## 2016-06-06 LAB — GLUCOSE, CAPILLARY
GLUCOSE-CAPILLARY: 158 mg/dL — AB (ref 65–99)
Glucose-Capillary: 180 mg/dL — ABNORMAL HIGH (ref 65–99)
Glucose-Capillary: 131 mg/dL — ABNORMAL HIGH (ref 65–99)
Glucose-Capillary: 133 mg/dL — ABNORMAL HIGH (ref 65–99)
Glucose-Capillary: 113 mg/dL — ABNORMAL HIGH (ref 65–99)
Glucose-Capillary: 119 mg/dL — ABNORMAL HIGH (ref 65–99)

## 2016-06-06 LAB — VANCOMYCIN, TROUGH: VANCOMYCIN TR: 34 ug/mL — AB (ref 15–20)

## 2016-06-06 LAB — COMPREHENSIVE METABOLIC PANEL
ALBUMIN: 2.5 g/dL — AB (ref 3.5–5.0)
ALT: 218 U/L — ABNORMAL HIGH (ref 14–54)
AST: 115 U/L — AB (ref 15–41)
Alkaline Phosphatase: 112 U/L (ref 38–126)
Anion gap: 11 (ref 5–15)
BILIRUBIN TOTAL: 2.8 mg/dL — AB (ref 0.3–1.2)
BUN: 38 mg/dL — AB (ref 6–20)
CHLORIDE: 109 mmol/L (ref 101–111)
CO2: 20 mmol/L — AB (ref 22–32)
Calcium: 7.6 mg/dL — ABNORMAL LOW (ref 8.9–10.3)
Creatinine, Ser: 1.44 mg/dL — ABNORMAL HIGH (ref 0.44–1.00)
GFR calc Af Amer: 42 mL/min — ABNORMAL LOW (ref >60)
GFR calc non Af Amer: 36 mL/min — ABNORMAL LOW (ref >60)
GLUCOSE: 188 mg/dL — AB (ref 65–99)
POTASSIUM: 3.1 mmol/L — AB (ref 3.5–5.1)
SODIUM: 140 mmol/L (ref 135–145)
Total Protein: 5.2 g/dL — ABNORMAL LOW (ref 6.5–8.1)

## 2016-06-06 LAB — CBC
HCT: 32.7 % — ABNORMAL LOW (ref 36.0–46.0)
HEMOGLOBIN: 11.1 g/dL — AB (ref 12.0–15.0)
MCH: 28.2 pg (ref 26.0–34.0)
MCHC: 33.9 g/dL (ref 30.0–36.0)
MCV: 83.2 fL (ref 78.0–100.0)
Platelets: 99 10*3/uL — ABNORMAL LOW (ref 150–400)
RBC: 3.93 MIL/uL (ref 3.87–5.11)
RDW: 14.5 % (ref 11.5–15.5)
WBC: 27 10*3/uL — ABNORMAL HIGH (ref 4.0–10.5)

## 2016-06-06 LAB — MAGNESIUM: Magnesium: 3.3 mg/dL — ABNORMAL HIGH (ref 1.7–2.4)

## 2016-06-06 LAB — APTT
APTT: 99 s — AB (ref 24–36)
APTT: 88 s — AB (ref 24–36)
APTT: 88 s — AB (ref 24–36)
aPTT: 91 seconds — ABNORMAL HIGH (ref 24–36)
aPTT: 74 seconds — ABNORMAL HIGH (ref 24–36)

## 2016-06-06 LAB — PHOSPHORUS: PHOSPHORUS: 3.7 mg/dL (ref 2.5–4.6)

## 2016-06-06 MED ORDER — AMIODARONE HCL IN DEXTROSE 360-4.14 MG/200ML-% IV SOLN
30.0000 mg/h | INTRAVENOUS | Status: DC
  Administered 2016-06-06 – 2016-06-10 (×8): 30 mg/h via INTRAVENOUS
  Filled 2016-06-06 (×17): qty 200

## 2016-06-06 MED ORDER — MIDAZOLAM HCL 2 MG/2ML IJ SOLN
2.0000 mg | INTRAMUSCULAR | Status: DC | PRN
  Administered 2016-06-06 – 2016-06-07 (×4): 2 mg via INTRAVENOUS
  Filled 2016-06-06 (×3): qty 2

## 2016-06-06 MED ORDER — ETOMIDATE 2 MG/ML IV SOLN
20.0000 mg | Freq: Once | INTRAVENOUS | Status: AC
  Administered 2016-06-06: 20 mg via INTRAVENOUS

## 2016-06-06 MED ORDER — AMIODARONE HCL IN DEXTROSE 360-4.14 MG/200ML-% IV SOLN
60.0000 mg/h | INTRAVENOUS | Status: DC
  Administered 2016-06-06 (×2): 60 mg/h via INTRAVENOUS
  Filled 2016-06-06 (×2): qty 200

## 2016-06-06 MED ORDER — MIDAZOLAM HCL 2 MG/2ML IJ SOLN
INTRAMUSCULAR | Status: AC
  Administered 2016-06-06: 23:00:00
  Filled 2016-06-06: qty 2

## 2016-06-06 MED ORDER — IOPAMIDOL (ISOVUE-370) INJECTION 76%
100.0000 mL | Freq: Once | INTRAVENOUS | Status: AC | PRN
  Administered 2016-06-04: 100 mL via INTRAVENOUS

## 2016-06-06 MED ORDER — ETOMIDATE 2 MG/ML IV SOLN
INTRAVENOUS | Status: AC
  Administered 2016-06-06: 20 mg via INTRAVENOUS
  Filled 2016-06-06: qty 10

## 2016-06-06 MED ORDER — PHENYLEPHRINE HCL 10 MG/ML IJ SOLN
0.0000 ug/min | INTRAVENOUS | Status: DC
  Administered 2016-06-06: 50 ug/min via INTRAVENOUS
  Filled 2016-06-06 (×2): qty 2

## 2016-06-06 MED ORDER — POTASSIUM CHLORIDE 10 MEQ/50ML IV SOLN
10.0000 meq | INTRAVENOUS | Status: AC
  Administered 2016-06-06 (×4): 10 meq via INTRAVENOUS
  Filled 2016-06-06 (×4): qty 50

## 2016-06-06 MED ORDER — SODIUM CHLORIDE 0.9 % IV SOLN
INTRAVENOUS | Status: DC
  Administered 2016-06-06 – 2016-06-07 (×2): via INTRAVENOUS

## 2016-06-06 MED ORDER — AMIODARONE LOAD VIA INFUSION
150.0000 mg | Freq: Once | INTRAVENOUS | Status: AC
  Administered 2016-06-06: 150 mg via INTRAVENOUS
  Filled 2016-06-06: qty 83.34

## 2016-06-06 MED ORDER — VITAL HIGH PROTEIN PO LIQD
1000.0000 mL | ORAL | Status: DC
  Administered 2016-06-07 – 2016-06-13 (×8): 1000 mL
  Filled 2016-06-06 (×6): qty 1000

## 2016-06-06 NOTE — Progress Notes (Signed)
[place] for Bivalirudin  Indication: atrial fibrillation   Allergies  Allergen Reactions  . Hornet Venom Anaphylaxis  . Pork-Derived Products Anaphylaxis  . Chlorhexidine Itching    Reports that it is the dye in it  . Enoxaparin Sodium     REACTION: thrombocytopenia  . Heparin     REACTION: thrombocytopenia  . Lactose Intolerance (Gi)   . Other     Sensitive to dye in Betadine & Chlorohexadine   . Povidone-Iodine     Sensitivity- but if its wiped off she is able to tolerate betadine   . Indomethacin Rash  . Phenylpropanolamine Rash    Other Reaction: other reaction  . Povidone Iodine Rash    Patient Measurements: Height: 5' (152.4 cm) Weight: 174 lb 6.1 oz (79.1 kg) IBW/kg (Calculated) : 45.5  Vital Signs: Temp: 98.1 F (36.7 C) (08/16 1815) Temp Source: Core (Comment) (08/16 1600) BP: 154/77 (08/16 1815) Pulse Rate: 96 (08/16 1815)  Labs:  Recent Labs  06/04/16 0045  06/04/16 1314  06/05/16 0443  06/05/16 1948 06/06/16 0327 06/06/16 1050 06/06/16 1425 06/06/16 1815  HGB 16.0*  --   --   --  13.5  --   --  11.1*  --   --   --   HCT 47.9*  --   --   --  40.1  --   --  32.7*  --   --   --   PLT 283  --   --   --  122*  --   --  99*  --   --   --   APTT  --   --   --   < > 84*  < >  --  99* 88* 91* 88*  LABPROT  --   --  19.0*  --   --   --   --   --   --   --   --   INR  --   --  1.58  --   --   --   --   --   --   --   --   CREATININE 0.68  < >  --   --  1.10*  --  1.27* 1.44*  --   --   --   TROPONINI <0.03  --   --   --   --   --   --   --   --   --   --   < > = values in this interval not displayed.  Estimated Creatinine Clearance: 34.3 mL/min (by C-G formula based on SCr of 1.44 mg/dL).   Assessment: Patient is a 70 yo female who presented 8/14 with sepsis of presumed biliary origin. She was on rivaroxaban PTA for AFib with last dose 8/13PM. She also has h/o APS w/ h/o DVT/PE. She has documented history of HIT  from both UFH and enoxaparin. LFTs and T bili were elevated on admission, therefore not using argatroban.   She was started on bivalirudin after [place] perc chole was completed 8/14.  Patient's aPTT was supratherapeutic at 88 seconds on bivalirudin 0.051 mg/kg/hr (4.8  mg/hr). Patient's renal function has been progressively declining since admission and urine output charted 0.3 mg/kg/hr. No bleeding noted.   Will decrease rate by 20% and re-check an aPTT in 2 hours.   Goal of Therapy:  Goal aPTT: 50-85 seconds Monitor platelets by anticoagulation protocol: Yes   Plan:  -Decrease bivalirudin  rate to 0.041 mg/kg/hr (3.2 mg/h). -F/u aPTT level in 2 hours -Monitor CBC, renal function, and s/sx of bleeding.   Georga Bora, PharmD Clinical Pharmacist Pager: [phone] 06/06/2016 7:37 PM

## 2016-06-06 NOTE — Progress Notes (Addendum)
Pharmacy Antibiotic Note  Latoya [name] is a 70 y.o. female admitted on 06/04/2016 with sepsis with likely biliary origin.  Pharmacy has been consulted for vancomycin and zosyn dosing.   Patient is currently afebrile and has WBC of 27.0. Her renal function has declined since admission, with current of SCr 1.44 mg/dL (was 0.68 mg/dL on admission).   VT was supratherapeutic today at 34 ug/mL today, however vancomycin has been stopped per ID. Zosyn is to continue.   Plan: - Stop vancomycin - Continue zosyn 3.375gm IV q8h  - Monitor renal function, C/S, and clinical progression   Height: 5' (152.4 cm) Weight: 174 lb 6.1 oz (79.1 kg) IBW/kg (Calculated) : 45.5  Temp (24hrs), Avg:98.6 F (37 C), Min:97.9 F (36.6 C), Max:99.3 F (37.4 C)   Recent Labs Lab 06/04/16 0045 06/04/16 0103 06/04/16 0332 06/04/16 0513 06/04/16 0520 06/05/16 0443 06/05/16 1948 06/06/16 0327 06/06/16 1050  WBC 15.9*  --   --   --   --  32.0*  --  27.0*  --   CREATININE 0.68  --   --   --  0.68 1.10* 1.27* 1.44*  --   LATICACIDVEN  --  6.88* 6.06* 6.95*  --   --   --   --   --   VANCOTROUGH  --   --   --   --   --   --   --   --  34*    Estimated Creatinine Clearance: 34.3 mL/min (by C-G formula based on SCr of 1.44 mg/dL).    Allergies  Allergen Reactions  . Hornet Venom Anaphylaxis  . Pork-Derived Products Anaphylaxis  . Chlorhexidine Itching    Reports that it is the dye in it  . Enoxaparin Sodium     REACTION: thrombocytopenia  . Heparin     REACTION: thrombocytopenia  . Lactose Intolerance (Gi)   . Other     Sensitive to dye in Betadine & Chlorohexadine   . Povidone-Iodine     Sensitivity- but if its wiped off she is able to tolerate betadine   . Indomethacin Rash  . Phenylpropanolamine Rash    Other Reaction: other reaction  . Povidone Iodine Rash    Antimicrobials this admission: Vancomycin 8/14 >>  Zosyn 8/14 >>  Dose adjustments this admission: N/A  Microbiology  results: 8/14 Gall bladder abscess: few GNR, few GPC in pairs  8/14 BCx x2: E Coli 2/2, GPCs 2/2 - enterococcus in 1/2   8/14 UCx: insignificant  8/14 MRSA PCR: negative   Thank you for allowing pharmacy to be a part of this patient's care.  Norwood Levo [facility] 06/06/2016 3:16 PM

## 2016-06-06 NOTE — Progress Notes (Signed)
[facility] for Bival[facility]ud[facility]  Indication: atrial fibrillation   Allergies  Allergen Reactions  . Hornet Venom Anaphylaxis  . Pork-Derived Products Anaphylaxis  . Chlorhexidine Itching    Reports that it is the dye in it  . Enoxaparin Sodium     REACTION: thrombocytopenia  . Heparin     REACTION: thrombocytopenia  . Lactose Intolerance (Gi)   . Other     Sensitive to dye in Betadine & Chlorohexadine   . Povidone-Iodine     Sensitivity- but if its wiped off she is able to tolerate betadine   . Indomethacin Rash  . Phenylpropanolamine Rash    Other Reaction: other reaction  . Povidone Iodine Rash    Patient Measurements: Height: 5' (152.4 cm) Weight: 174 lb 6.1 oz (79.1 kg) IBW/kg (Calculated) : 45.5  Vital Signs: Temp: 98.2 F (36.8 C) (08/16 1545) Temp Source: Core (Comment) (08/16 1200) BP: 104/72 (08/16 1530) Pulse Rate: 82 (08/16 1545)  Labs:  Recent Labs  06/04/16 0045  06/04/16 1314  06/05/16 0443  06/05/16 1948 06/06/16 0327 06/06/16 1050 06/06/16 1425  HGB 16.0*  --   --   --  13.5  --   --  11.1*  --   --   HCT 47.9*  --   --   --  40.1  --   --  32.7*  --   --   PLT 283  --   --   --  122*  --   --  99*  --   --   APTT  --   --   --   < > 84*  < >  --  99* 88* 91*  LABPROT  --   --  19.0*  --   --   --   --   --   --   --   INR  --   --  1.58  --   --   --   --   --   --   --   CREATININE 0.68  < >  --   --  1.10*  --  1.27* 1.44*  --   --   TROPONINI <0.03  --   --   --   --   --   --   --   --   --   < > = values in this interval not displayed.  Estimated Creatinine Clearance: 34.3 mL/min (by C-G formula based on SCr of 1.44 mg/dL).   Assessment: Patient is a 70 yo female who presented 8/14 with sepsis of presumed biliary origin. She was on rivaroxaban PTA for AFib with last dose 8/13PM. She also has h/o APS w/ h/o DVT/PE. She has documented history of HIT from both UFH and enoxaparin. LFTs and T bili were  elevated on admission, therefore not using argatroban.   She was started on bivalirudin after [place] perc chole was completed 8/14.  Patient's aPTT (8/16 at 1425) was supratherapeutic at 91 seconds on bivalirudin 0.064 mg/kg/hr (4.8 mg/hr). Patient's renal function has been progressively declining since admission, which may have contributed to her increased aPTT level. No bleeding noted.   Will decrease rate by 20% and re-check an aPTT in 2 hours.   Goal of Therapy:  Goal aPTT: 50-85 seconds Monitor platelets by anticoagulation protocol: Yes   Plan:  -Decrease bivalirudin rate to 0.05mg /kg/hr (3.8 mg/h). -F/u aPTT level in 2 hours -Monitor CBC, renal function, and s/sx of bleeding.  Demetrius Charity, PharmD Acute Care Pharmacy Resident  Pager: [phone] 06/06/2016

## 2016-06-06 NOTE — Consult Note (Signed)
[facility] for [facility]ious Disease    Date of Admission:  06/04/2016           Day 3 vancomycin        Day 3 piperacillin tazobactam       Reason for Consult: Automatic consultation for enterococcal bacteremia     Principal Problem:   Ascending cholangitis Active Problems:   Enterococcal bacteremia   Bacteremia due to Klebsiella pneumoniae   Bacteremia due to Escherichia coli   PAROXYSMAL ATRIAL FIBRILLATION   Sepsis ([place])   Hemangioma of liver   Severe sepsis (HCC)   Septic shock (HCC)   Acute respiratory failure with hypoxia ([place])   . antiseptic oral rinse  7 mL Mouth Rinse Q4H  . budesonide (PULMICORT) nebulizer solution  0.25 mg Nebulization BID  . feeding supplement (VITAL HIGH PROTEIN)  1,000 mL Per Tube Q24H  . hydrocortisone sodium succinate  50 mg Intravenous Q6H  . ipratropium-albuterol  3 mL Nebulization Q6H  . levothyroxine  37 mcg Intravenous Daily  . montelukast  10 mg Oral QHS  . pantoprazole (PROTONIX) IV  40 mg Intravenous Q12H  . piperacillin-tazobactam (ZOSYN)  IV  3.375 g Intravenous Q8H    Recommendations: 1. Continue piperacillin tazobactam pending final susceptibilities 2. Discontinue vancomycin  3. Repeat blood cultures  Assessment: She has acute cholangitis complicated by polymicrobial bacteremia. I will continue piperacillin tazobactam alone pending final antibiotic susceptibilities.    HPI: Latoya [name] is a 70 y.o. female who was admitted 3 days ago with abdominal pain, lethargy and septic shock. She had a temperature of 103 on admission. CT scan revealed a distended gallbladder with pericholecystic fluid and biliary ductal dilatation. Admission blood cultures have grown enterococcus, Escherichia coli and Klebsiella. She underwent percutaneous cholecystostomy. Gallbladder fluid is growing gram-negative rods and enterococcus faecium. Antibiotic susceptibilities pending. She has defervesced.   Review of Systems: Review of  Systems  Unable to perform ROS: Intubated    Past Medical History:  Diagnosis Date  . Anemia, iron deficiency   . Antiphospholipid syndrome (HCC)    with hypercoagulable state  . Asthma    extrinsic; moderate, persistant, nml spirometry 2010, nml CXR 1/08  . Bladder troubles    REPORTS INFECTIONS ON OCCASION DUE TO URETHRA MEATUS STRICTURE AT BIRTH   . Blood dyscrasia    antiphospholipid disorder  . CAD (coronary artery disease)    50% mid LAD, 80% ostial D1 and moderate 80% mid circ by vath 2003  . Cancer ([place])    basal cell removed fr. L arm   . Complication of anesthesia    cardiac arrest- in OR, at age 47 (10)y.o. during Scalenotomy in her early 20's  . Coronary heart disease   . Drug allergy    heparin/lovenox  . DVT (deep venous thrombosis) ([place])   . Erosive gastritis 1994  . Family history of adverse reaction to anesthesia    some family members have had trouble waking up  . GERD (gastroesophageal reflux disease)   . H/O hiatal hernia   . HLD (hyperlipidemia)   . HTN (hypertension)    [name] at [facility], manages pt.  LOV 03/2014  . Hypothyroidism   . Liver spot    cyst- - no biopsy, but told that its benign   . PAF (paroxysmal atrial fibrillation) (HCC)    not on coumadin therapy  . Pulmonary embolism ([place])    related to back surgery with prior coumadin  use, now off  . Spondylosis, lumbosacral    ARTHRITIS- OA     Social History  Substance Use Topics  . Smoking status: Never Smoker  . Smokeless tobacco: Never Used     Comment: no smoking   . Alcohol use No    Family History  Problem Relation Age of Onset  . Heart disease Father     MI   . Heart disease Sister   .      Allergies  Allergen Reactions  . Hornet Venom Anaphylaxis  . Pork-Derived Products Anaphylaxis  . Chlorhexidine Itching    Reports that it is the dye in it  . Enoxaparin Sodium     REACTION: thrombocytopenia  . Heparin     REACTION: thrombocytopenia  . Lactose Intolerance  (Gi)   . Other     Sensitive to dye in Betadine & Chlorohexadine   . Povidone-Iodine     Sensitivity- but if its wiped off she is able to tolerate betadine   . Indomethacin Rash  . Phenylpropanolamine Rash    Other Reaction: other reaction  . Povidone Iodine Rash    OBJECTIVE: Blood pressure (!) 114/46, pulse (!) 118, temperature 98.4 F (36.9 C), resp. rate (!) 30, height 5' (1.524 m), weight 174 lb 6.1 oz (79.1 kg), SpO2 99 %.  Physical Exam  Constitutional:  She is intubated and unresponsive in the ICU.  Cardiovascular: Normal rate and regular rhythm.   No murmur heard. Pulmonary/Chest: Breath sounds normal.  Abdominal: Soft. She exhibits no distension.  Dark green bile in cholecystostomy drain bag.    Lab Results Lab Results  Component Value Date   WBC 27.0 (H) 06/06/2016   HGB 11.1 (L) 06/06/2016   HCT 32.7 (L) 06/06/2016   MCV 83.2 06/06/2016   PLT 99 (L) 06/06/2016    Lab Results  Component Value Date   CREATININE 1.44 (H) 06/06/2016   BUN 38 (H) 06/06/2016   NA 140 06/06/2016   K 3.1 (L) 06/06/2016   CL 109 06/06/2016   CO2 20 (L) 06/06/2016    Lab Results  Component Value Date   ALT 218 (H) 06/06/2016   AST 115 (H) 06/06/2016   ALKPHOS 112 06/06/2016   BILITOT 2.8 (H) 06/06/2016     Microbiology: Recent Results (from the past 240 hour(s))  Blood culture (routine x 2)     Status: Abnormal (Preliminary result)   Collection Time: 06/04/16 12:45 AM  Result Value Ref Range Status   Specimen Description BLOOD LEFT ANTECUBITAL  Final   Special Requests BOTTLES DRAWN AEROBIC AND ANAEROBIC 5CC EA  Final   Culture  Setup Time   Final    GRAM NEGATIVE RODS GRAM POSITIVE COCCI CRITICAL RESULT CALLED TO, READ BACK BY AND VERIFIED WITH: Salome Holmes, PHARMD AT [id number] 06/04/16 BY D. VANHOOK IN BOTH AEROBIC AND ANAEROBIC BOTTLES    Culture (A)  Final    ESCHERICHIA COLI KLEBSIELLA PNEUMONIAE ENTEROCOCCUS SPECIES SUSCEPTIBILITIES TO FOLLOW    Report Status  PENDING  Incomplete  Blood Culture ID Panel (Reflexed)     Status: Abnormal   Collection Time: 06/04/16 12:45 AM  Result Value Ref Range Status   Enterococcus species NOT DETECTED NOT DETECTED Final   Vancomycin resistance NOT DETECTED NOT DETECTED Final   Listeria monocytogenes NOT DETECTED NOT DETECTED Final   Staphylococcus species NOT DETECTED NOT DETECTED Final   Staphylococcus aureus NOT DETECTED NOT DETECTED Final   Methicillin resistance NOT DETECTED NOT DETECTED Final   Streptococcus  species NOT DETECTED NOT DETECTED Final   Streptococcus agalactiae NOT DETECTED NOT DETECTED Final   Streptococcus pneumoniae NOT DETECTED NOT DETECTED Final   Streptococcus pyogenes NOT DETECTED NOT DETECTED Final   Acinetobacter baumannii NOT DETECTED NOT DETECTED Final   Enterobacteriaceae species DETECTED (A) NOT DETECTED Final    Comment: CRITICAL RESULT CALLED TO, READ BACK BY AND VERIFIED WITH: Salome Holmes, PHARMD AT 1415 06/04/16 BY D. VANHOOK    Enterobacter cloacae complex NOT DETECTED NOT DETECTED Final   Escherichia coli DETECTED (A) NOT DETECTED Final    Comment: CRITICAL RESULT CALLED TO, READ BACK BY AND VERIFIED WITH: Salome Holmes, PHARMD AT 1415 06/04/16 BY D. VANHOOK    Klebsiella oxytoca NOT DETECTED NOT DETECTED Final   Klebsiella pneumoniae NOT DETECTED NOT DETECTED Final   Proteus species NOT DETECTED NOT DETECTED Final   Serratia marcescens NOT DETECTED NOT DETECTED Final   Carbapenem resistance NOT DETECTED NOT DETECTED Final   Haemophilus influenzae NOT DETECTED NOT DETECTED Final   Neisseria meningitidis NOT DETECTED NOT DETECTED Final   Pseudomonas aeruginosa NOT DETECTED NOT DETECTED Final   Candida albicans NOT DETECTED NOT DETECTED Final   Candida glabrata NOT DETECTED NOT DETECTED Final   Candida krusei NOT DETECTED NOT DETECTED Final   Candida parapsilosis NOT DETECTED NOT DETECTED Final   Candida tropicalis NOT DETECTED NOT DETECTED Final  Blood culture (routine x  2)     Status: Abnormal (Preliminary result)   Collection Time: 06/04/16 12:54 AM  Result Value Ref Range Status   Specimen Description BLOOD LEFT HAND  Final   Special Requests IN PEDIATRIC BOTTLE 4CC  Final   Culture  Setup Time   Final    GRAM NEGATIVE RODS GRAM POSITIVE COCCI CRITICAL RESULT CALLED TO, READ BACK BY AND VERIFIED WITH: Salome Holmes, PHARMD AT 1415 06/04/16 BY D. VANHOOK AEROBIC BOTTLE ONLY    Culture (A)  Final    ESCHERICHIA COLI SUSCEPTIBILITIES TO FOLLOW GRAM POSITIVE COCCI CULTURE REINCUBATED FOR BETTER GROWTH    Report Status PENDING  Incomplete  Urine culture     Status: Abnormal   Collection Time: 06/04/16  1:33 AM  Result Value Ref Range Status   Specimen Description URINE, RANDOM  Final   Special Requests NONE  Final   Culture <10,000 COLONIES/mL INSIGNIFICANT GROWTH (A)  Final   Report Status 06/05/2016 FINAL  Final  MRSA PCR Screening     Status: None   Collection Time: 06/04/16  7:07 AM  Result Value Ref Range Status   MRSA by PCR NEGATIVE NEGATIVE Final    Comment:        The GeneXpert MRSA Assay (FDA approved for NASAL specimens only), is one component of a comprehensive MRSA colonization surveillance program. It is not intended to diagnose MRSA infection nor to guide or monitor treatment for MRSA infections.   Aerobic/Anaerobic Culture (surgical/deep wound)     Status: None (Preliminary result)   Collection Time: 06/04/16  4:05 PM  Result Value Ref Range Status   Specimen Description ABSCESS GALL BLADDER  Final   Special Requests Normal  Final   Gram Stain   Final    RARE WBC PRESENT, PREDOMINANTLY PMN FEW GRAM NEGATIVE RODS FEW GRAM POSITIVE COCCI IN PAIRS IN CHAINS    Culture MODERATE ENTEROCOCCUS FAECIUM  Final   Report Status PENDING  Incomplete    Michel Bickers, MD [facility]ious [facility] [phone] pager   [phone] cell 06/06/2016, 2:49 PM

## 2016-06-06 NOTE — Care Management Important Message (Signed)
Important Message  Patient Details  Name: Latoya [name] MRN: [id number] Date of Birth: February 11, 1946   Medicare Important Message Given:  Yes    Loann Quill 06/06/2016, 11:40 AM

## 2016-06-06 NOTE — Progress Notes (Signed)
Subjective: Awake on vent Weaning pressores   Objective: Vital signs in last 24 hours: Temp:  [97.9 F (36.6 C)-99.5 F (37.5 C)] 98.6 F (37 C) (08/16 0800) Pulse Rate:  [32-151] 121 (08/16 0800) Resp:  [19-31] 30 (08/16 0800) BP: (75-138)/(51-105) 112/86 (08/16 0800) SpO2:  [89 %-100 %] 100 % (08/16 0800) FiO2 (%):  [30 %-40 %] 40 % (08/16 0756) Weight:  [79.1 kg (174 lb 6.1 oz)] 79.1 kg (174 lb 6.1 oz) (08/16 0215) Last BM Date: 06/03/16  Intake/Output from previous day: 08/15 0701 - 08/16 0700 In: 4384.8 [I.V.:2592.3; NG/GT:80; IV Piggyback:1712.5] Out: 1740 [Urine:565; Emesis/NG output:1050; Drains:125] Intake/Output this shift: Total I/O In: 70 [NG/GT:20; IV Piggyback:50] Out: 35 [Urine:35]  GI: soft ND  NT perc drain in place   draining   Lab Results:   Recent Labs  06/05/16 0443 06/06/16 0327  WBC 32.0* 27.0*  HGB 13.5 11.1*  HCT 40.1 32.7*  PLT 122* 99*   BMET  Recent Labs  06/05/16 1948 06/06/16 0327  NA 140 140  K 3.4* 3.1*  CL 111 109  CO2 19* 20*  GLUCOSE 137* 188*  BUN 35* 38*  CREATININE 1.27* 1.44*  CALCIUM 7.5* 7.6*   PT/INR  Recent Labs  06/04/16 1314  LABPROT 19.0*  INR 1.58   ABG  Recent Labs  06/05/16 0354 06/06/16 0321  PHART 7.327* 7.498*  HCO3 16.8* 17.2*    Studies/Results: Ir Perc Cholecystostomy  Result Date: 06/04/2016 INDICATION: Acute cholecystitis, septic shock EXAM: CHOLECYSTOSTOMY MEDICATIONS: Patient is already receiving IV antibiotics. ANESTHESIA/SEDATION: Moderate Sedation Time: None. The patient's level of consciousness and vital signs were monitored continuously by radiology nursing throughout the procedure under my direct supervision. FLUOROSCOPY TIME:  Fluoroscopy Time: None. COMPLICATIONS: None immediate. PROCEDURE: Informed written consent was obtained from the patient's family after a thorough discussion of the procedural risks, benefits and alternatives. All questions were addressed. Maximal  Sterile Barrier Technique was utilized including caps, mask, sterile gowns, sterile gloves, sterile drape, hand hygiene and skin antiseptic. A timeout was performed prior to the initiation of the procedure. Previous imaging reviewed. Procedure was performed at the bedside in the ICU because of the patient's clinical status. Preliminary ultrasound performed. The distended gallbladder in the right upper quadrant was localized between the lower ribs. Under sterile conditions and local anesthesia, ultrasound percutaneous transhepatic needle access performed of the gallbladder. Needle position confirmed in the gallbladder. Images obtained for documentation. There was return of bile. Sample sent for Gram stain and culture. 018 guidewire advanced followed by the Accustick dilator set. This allowed insertion of the Accustick dilator set and exchange of the guidewire for an Amplatz guidewire. Tract dilatation performed to insert a 10 [place] drain. Catheter position confirmed with ultrasound. Syringe aspiration yielded additional bile. Catheter secured with a Prolene suture and connected to external gravity drainage bag. Sterile dressing applied. No immediate complication. Patient tolerated the procedure well. IMPRESSION: Successful ultrasound percutaneous transhepatic cholecystostomy. Electronically Signed   By: Jerilynn Mages.  Shick M.D.   On: 06/04/2016 16:25   Dg Chest Port 1 View  Result Date: 06/06/2016 CLINICAL DATA:  Intubation. EXAM: PORTABLE CHEST 1 VIEW COMPARISON:  08/15/ 2017. FINDINGS: Endotracheal tube and NG tube in stable position. Low lung volumes with persist bibasilar atelectasis and/or infiltrates. No interim change. Small right pleural effusion cannot be excluded. No pneumothorax. Heart size stable. Prior cervical thoracolumbar spine. IMPRESSION: 1. Lines tubes in position. 2. Low lung volumes with bibasilar atelectasis and/or infiltrates, no interim change. Small right  pleural effusion cannot be excluded  Electronically Signed   By: [place]   On: 06/06/2016 06:55   Dg Chest Port 1 View  Result Date: 06/05/2016 CLINICAL DATA:  Shortness of breath. EXAM: PORTABLE CHEST 1 VIEW COMPARISON:  06/04/2016. FINDINGS: Endotracheal tube, NG tube, right IJ line stable position. Heart size normal. Bibasilar atelectasis and/or infiltrates. Small bilateral pleural effusions. No pneumothorax . Right upper quadrant drainage catheter noted. Prior cervical and thoracolumbar spine fusion . IMPRESSION: 1. Lines and tubes in stable position. 2. Persistent bibasilar atelectasis and/or infiltrates. No change from prior exam. Small bilateral pleural effusions noted on today's exam. Electronically Signed   By: [name]  Register   On: 06/05/2016 07:05   Dg Chest Port 1 View  Result Date: 06/04/2016 CLINICAL DATA:  70 year old female with a history of line placement EXAM: PORTABLE CHEST 1 VIEW COMPARISON:  06/04/2016 FINDINGS: Significant right rotation the patient limits evaluation. Endotracheal tube terminates suitably above the carina, measuring 4.2 cm. Interval placement of right IJ approach central venous catheter which appears to terminate superior vena cava. No pneumothorax. Mixed interstitial and airspace opacities bilateral lungs, similar to the prior. Surgical changes of cervical region in the thoracolumbar junction, incompletely imaged. IMPRESSION: Interval placement of right IJ approach central venous catheter without complicating features. Tip appears to terminate superior vena cava. Unchanged endotracheal tube. Similar appearance of mixed interstitial and airspace disease bilateral lung bases with low lung volumes. Signed, Dulcy Fanny. Earleen Newport, DO [facility] Electronically Signed   By: Corrie Mckusick D.O.   On: 06/04/2016 14:28   Dg Abd Portable 1v  Result Date: 06/04/2016 CLINICAL DATA:  Check gastric catheter placement EXAM: PORTABLE ABDOMEN - 1 VIEW  COMPARISON:  None. FINDINGS: Gastric catheter is now noted within the stomach. Significant previous orthopedic hardware is noted within the lumbosacral spine. Fractured screw is again noted extending into the ilium on the left stable from the previous exam. No acute bony abnormality is noted. No free air is seen. IMPRESSION: Gastric catheter within the stomach as described. Electronically Signed   By: Inez Catalina M.D.   On: 06/04/2016 08:29    Anti-infectives: Anti-infectives    Start     Dose/Rate Route Frequency Ordered Stop   06/05/16 0030  vancomycin (VANCOCIN) IVPB 750 mg/150 ml premix     750 mg 150 mL/hr over 60 Minutes Intravenous Every 12 hours 06/04/16 1135     06/04/16 1300  piperacillin-tazobactam (ZOSYN) IVPB 3.375 g     3.375 g 12.5 mL/hr over 240 Minutes Intravenous Every 8 hours 06/04/16 0423     06/04/16 1200  vancomycin (VANCOCIN) 1,500 mg in sodium chloride 0.9 % 500 mL IVPB     1,500 mg 250 mL/hr over 120 Minutes Intravenous  Once 06/04/16 1135 06/04/16 1412   06/04/16 0345  piperacillin-tazobactam (ZOSYN) IVPB 3.375 g     3.375 g 100 mL/hr over 30 Minutes Intravenous  Once 06/04/16 0344 06/04/16 0456      Assessment/Plan: Patient Active Problem List   Diagnosis Date Noted  . Ascending cholangitis 06/04/2016  . Sepsis ([place]) 06/04/2016  . Hemangioma of liver 06/04/2016  . Severe sepsis ([place]) 06/04/2016  . Cervical pseudoarthrosis ([place]) 08/31/2014  . Anaphylaxis, mild, due to wasp envenomation 04/12/2013  . Chest pain, atypical 04/11/2013  . PAD (peripheral artery disease) ([place]) 06/20/2011  . ANEMIA, IRON DEFICIENCY, MICROCYTIC 06/01/2010  . Primary hypercoagulable state ([place]) 06/01/2010  . BACK PAIN, CHRONIC 06/01/2010  . ABDOMINAL PAIN -  GENERALIZED 06/01/2010  . CAROTID ARTERY DISEASE 05/25/2010  . CAD, NATIVE VESSEL 11/15/2009  . MURMUR 11/15/2009  . CAROTID BRUIT, LEFT 11/15/2009  . HYPERTENSION 03/18/2009  . History of pulmonary embolism 03/18/2009  .  PAROXYSMAL ATRIAL FIBRILLATION 03/18/2009  . DVT 03/18/2009  . Asthma 03/18/2009  . GERD 03/18/2009  . SPONDYLOSIS, LUMBAR 03/18/2009  . HYPERLIPIDEMIA 11/29/2008  . CORONARY HEART DISEASE 11/29/2008  Percutaneous cholecystostomy   Weaning pressors  Needs MRCP when able     LOS: 2 days    [place] Amon A. 06/06/2016

## 2016-06-06 NOTE — Progress Notes (Addendum)
PULMONARY / CRITICAL CARE MEDICINE   Name: Latoya [name] MRN: [id number] DOB: 03-05-1946    ADMISSION DATE:  06/04/2016  CHIEF COMPLAINT:  Abdominal Pain  HISTORY OF PRESENT ILLNESS:   Latoya [name] is a 70 y/o woman with MMP, most notable for antiphospholipid syndrome on AC (hx of one provoked DVT), as well as CAD and asthma who presented to the ED with sudden onset of chills and abdominal pain. In the ED her clinical status declined, becoming less responsive, more tachycardic and more tachypneic. Imaging studies suggested an enlarged gallbladder.  SUBJECTIVE:  Off pressors and more alert and interactive this AM.  VITAL SIGNS: BP 97/77   Pulse (!) 132   Temp 98.4 F (36.9 C)   Resp 11   Ht 5' (1.524 m)   Wt 79.1 kg (174 lb 6.1 oz)   SpO2 99%   BMI 34.06 kg/m    HEMODYNAMICS: CVP:  [0 mmHg-10 mmHg] 10 mmHg  VENTILATOR SETTINGS: Vent Mode: PSV;CPAP FiO2 (%):  [40 %] 40 % Set Rate:  [30 bmp] 30 bmp Vt Set:  [400 mL] 400 mL PEEP:  [5 cmH20] 5 cmH20 Pressure Support:  [10 cmH20] 10 cmH20 Plateau Pressure:  [16 cmH20-19 cmH20] 19 cmH20  INTAKE / OUTPUT: I/O last 3 completed shifts: In: 6564.5 [I.V.:3907; NG/GT:170; IV Piggyback:2487.5] Out: 2976 [Urine:838; Emesis/NG output:1950; Drains:188]  PHYSICAL EXAMINATION: General:  Elderly woman, chronically ill appearing, awake and interactive on vent. Neuro:  Awake and interactive, moving all ext to command. HEENT:  ETT, [place]/AT, PERRL, EOM-spontaneous. Cardiovascular:  Tachycardic, sinus, Nl S1/S2, -M/R/G. Lungs:  Coarse BS diffusely. Abdomen:  Obese, NT, ND and +BS, unresponsive. Musculoskeletal:  No joint swelling Skin:  No visible rashes  LABS:  BMET  Recent Labs Lab 06/05/16 0443 06/05/16 1948 06/06/16 0327  NA 143 140 140  K 3.0* 3.4* 3.1*  CL 109 111 109  CO2 19* 19* 20*  BUN 26* 35* 38*  CREATININE 1.10* 1.27* 1.44*  GLUCOSE 114* 137* 188*   Electrolytes  Recent Labs Lab 06/05/16 0443 06/05/16 1948  06/06/16 0327  CALCIUM 8.0* 7.5* 7.6*  MG 1.3* 3.3* 3.3*  PHOS 5.5*  --  3.7   CBC  Recent Labs Lab 06/04/16 0045 06/05/16 0443 06/06/16 0327  WBC 15.9* 32.0* 27.0*  HGB 16.0* 13.5 11.1*  HCT 47.9* 40.1 32.7*  PLT 283 122* 99*   Coag's  Recent Labs Lab 06/04/16 1314  06/05/16 0443 06/05/16 1700 06/06/16 0327  APTT  --   < > 84* 77* 99*  INR 1.58  --   --   --   --   < > = values in this interval not displayed. Sepsis Markers  Recent Labs Lab 06/04/16 0103 06/04/16 0332 06/04/16 0513  LATICACIDVEN 6.88* 6.06* 6.95*   ABG  Recent Labs Lab 06/04/16 0830 06/05/16 0354 06/06/16 0321  PHART 7.294* 7.327* 7.498*  PCO2ART 37.6 33.6* 22.1*  PO2ART 226* 81.1 59.0*   Liver Enzymes  Recent Labs Lab 06/04/16 0045 06/04/16 0520 06/06/16 0327  AST 500* 752* 115*  ALT 291* 486* 218*  ALKPHOS 138* 129* 112  BILITOT 2.7* 2.5* 2.8*  ALBUMIN 4.5 3.4* 2.5*   Cardiac Enzymes  Recent Labs Lab 06/04/16 0045  TROPONINI <0.03   Glucose  Recent Labs Lab 06/05/16 1147 06/05/16 1543 06/05/16 1945 06/05/16 2350 06/06/16 0340 06/06/16 0756  GLUCAP 121* 143* 139* 156* 180* 158*   Imaging Dg Chest Port 1 View  Result Date: 06/06/2016 CLINICAL DATA:  Intubation. EXAM:  PORTABLE CHEST 1 VIEW COMPARISON:  08/15/ 2017. FINDINGS: Endotracheal tube and NG tube in stable position. Low lung volumes with persist bibasilar atelectasis and/or infiltrates. No interim change. Small right pleural effusion cannot be excluded. No pneumothorax. Heart size stable. Prior cervical thoracolumbar spine. IMPRESSION: 1. Lines tubes in position. 2. Low lung volumes with bibasilar atelectasis and/or infiltrates, no interim change. Small right pleural effusion cannot be excluded Electronically Signed   By: [name]  Register   On: 06/06/2016 06:55   STUDIES:  CTA ABD with enlarged galbladder and enlarging hepatic cyst. RUQ [place] noted  CULTURES: Blood 8/14 >>E. Coli and GPC (speciation  pending). Urine 8/14 >>NTD.  ANTIBIOTICS: Vanc 8/14 >> Zosyn 8/14 >>  SIGNIFICANT EVENTS: Intubated in ED after initial evaluation  LINES/TUBES: ETT 7.5 mm 8/14>>> Foley 8/14>>> R IJ TLC 8/14>>>  DISCUSSION: 70 y/o woman with severe sepsis of presumed biliary origin  ASSESSMENT / PLAN:  PULMONARY A: Need for Mechanical Ventilation Moderate persistent asthma P:   Begin PS trials, no extubation until able to diurese more. ABG in AM. Nebulized budesonide, ipratropium, and albuterol for asthma.  CARDIOVASCULAR A:  CAD. HTN. PAF. Evolving septic shock. A-fib with RVR. P:  CVP 10, hold further IVF. Pressors off. Stress dose steroids. Hold all anti-HTN. Amiodarone drip with bolus.  RENAL A:   Lactic Acidosis P:   Replace electrolytes as indicated. CVP 10. KVO IVF. BMET in AM.  GASTROINTESTINAL A:   Abnormal LFTs Probable cholangitis P:   Drain placed in by IR. Post pyloric cor-trak. TF per nutrition. MRCP ordered 8/16.  HEMATOLOGIC A:   Hx of Antiphospholipid Ab Syndrome Hx of provoked DVT On rivaroxaban Hx of allergy to Heparin products P:  Bivalirudin. CBC in AM. Monitor coags.  INFECTIOUS A:   Severe sepsis, probable biliary origin P:   Continue abx (Vanc/zosyn) concern for GPCs to be enterococcous per lab.. PTC per IR Awaiting surgery's input.  ENDOCRINE A:   Hypothyroidism P:   Cont home syndroid. Cortisol level 59. Stress dose steroids, will continue for now until more hemodynamically stable.  NEUROLOGIC A:   Need for sedation P:   D/Ced propfol. RASS goal: 0 Fentanyl drip.  FAMILY  - Updates: Husband updated bedside.  - Inter-disciplinary family meet or Palliative Care meeting due by:  8/21  The patient is critically ill with multiple organ systems failure and requires high complexity decision making for assessment and support, frequent evaluation and titration of therapies, application of advanced monitoring  technologies and extensive interpretation of multiple databases.   Critical Care Time devoted to patient care services described in this note is  35  Minutes. This time reflects time of care of this signee Dr Jennet Maduro. This critical care time does not reflect procedure time, or teaching time or supervisory time of PA/NP/Med student/Med Resident etc but could involve care discussion time.  Rush Farmer, M.D. [facility] Pulmonary/Critical Care Medicine. Pager: [phone]. After hours pager: [phone].  06/06/2016, 9:47 AM

## 2016-06-06 NOTE — Progress Notes (Signed)
ANTICOAGULATION CONSULT NOTE - Follow Up Consult  Pharmacy Consult for Bivalirudin Indication: atrial fibrillation  Allergies  Allergen Reactions  . Hornet Venom Anaphylaxis  . Pork-Derived Products Anaphylaxis  . Chlorhexidine Itching    Reports that it is the dye in it  . Enoxaparin Sodium     REACTION: thrombocytopenia  . Heparin     REACTION: thrombocytopenia  . Lactose Intolerance (Gi)   . Other     Sensitive to dye in Betadine & Chlorohexadine   . Povidone-Iodine     Sensitivity- but if its wiped off she is able to tolerate betadine   . Indomethacin Rash  . Phenylpropanolamine Rash    Other Reaction: other reaction  . Povidone Iodine Rash    Patient Measurements: Height: 5' (152.4 cm) Weight: 174 lb 6.1 oz (79.1 kg) IBW/kg (Calculated) : 45.5  Vital Signs: Temp: 97.9 F (36.6 C) (08/16 2200) Temp Source: Core (Comment) (08/16 2000) BP: 131/75 (08/16 2200) Pulse Rate: 99 (08/16 2200)  Labs:  Recent Labs  06/04/16 0045  06/04/16 1314  06/05/16 0443  06/05/16 1948 06/06/16 0327  06/06/16 1425 06/06/16 1815 06/06/16 2210  HGB 16.0*  --   --   --  13.5  --   --  11.1*  --   --   --   --   HCT 47.9*  --   --   --  40.1  --   --  32.7*  --   --   --   --   PLT 283  --   --   --  122*  --   --  99*  --   --   --   --   APTT  --   --   --   < > 84*  < >  --  99*  < > 91* 88* 74*  LABPROT  --   --  19.0*  --   --   --   --   --   --   --   --   --   INR  --   --  1.58  --   --   --   --   --   --   --   --   --   CREATININE 0.68  < >  --   --  1.10*  --  1.27* 1.44*  --   --   --   --   TROPONINI <0.03  --   --   --   --   --   --   --   --   --   --   --   < > = values in this interval not displayed.  Estimated Creatinine Clearance: 34.3 mL/min (by C-G formula based on SCr of 1.44 mg/dL).  Assessment: On Bivalirudin for afib while Xarelto on hold, aPTT is therapeutic x 1 after rate decrease  Goal of Therapy:  aPTT 50-85 seconds Monitor platelets by  anticoagulation protocol: Yes   Plan:  -Cont Bival at 0.041 mg/kg/hr -Check aPTT with AM labs  Narda Bonds 06/06/2016,11:27 PM

## 2016-06-06 NOTE — Progress Notes (Signed)
[place] Progress Note Patient Name: Latoya [name] [facility] DOB: 1945/11/09 MRN: [id number]   Date of Service  06/06/2016  HPI/Events of Note  Agitation on vent  eICU Interventions  Continue fentanyl drip Add versed PRN     Intervention Category Minor Interventions: Agitation / anxiety - evaluation and management  Wendel Homeyer 06/06/2016, 10:27 PM

## 2016-06-06 NOTE — Progress Notes (Signed)
Order received for MRCP without and with contrast.  Spoke with Dr Leonia Reeves and we discussed the case and the fact that she not only is on the vent and cannot breath hold, even if she is extubated and can cooperate for breath holding, the amount of orthopedic metal in her back would only allow a small window of the outer surface of her abdomen to be seen.  All other anatomy would be obliterated by artifact caused by the metal.  Unfortunately she is not a good candidate for abdomen MRI.  Notified Dr Nelda Marseille and he understands.

## 2016-06-06 NOTE — Progress Notes (Addendum)
[place] for Bivalirudin  Indication: atrial fibrillation   Allergies  Allergen Reactions  . Hornet Venom Anaphylaxis  . Pork-Derived Products Anaphylaxis  . Chlorhexidine Itching    Reports that it is the dye in it  . Enoxaparin Sodium     REACTION: thrombocytopenia  . Heparin     REACTION: thrombocytopenia  . Lactose Intolerance (Gi)   . Other     Sensitive to dye in Betadine & Chlorohexadine   . Povidone-Iodine     Sensitivity- but if its wiped off she is able to tolerate betadine   . Indomethacin Rash  . Phenylpropanolamine Rash    Other Reaction: other reaction  . Povidone Iodine Rash    Patient Measurements: Height: 5' (152.4 cm) Weight: 174 lb 6.1 oz (79.1 kg) IBW/kg (Calculated) : 45.5  Vital Signs: Temp: 98.6 F (37 C) (08/16 0630) Temp Source: Core (Comment) (08/16 0400) BP: 100/76 (08/16 0630) Pulse Rate: 111 (08/16 0630)  Labs:  Recent Labs  06/04/16 0045  06/04/16 1314  06/05/16 0443 06/05/16 1700 06/05/16 1948 06/06/16 0327  HGB 16.0*  --   --   --  13.5  --   --  11.1*  HCT 47.9*  --   --   --  40.1  --   --  32.7*  PLT 283  --   --   --  122*  --   --  99*  APTT  --   --   --   < > 84* 77*  --  99*  LABPROT  --   --  19.0*  --   --   --   --   --   INR  --   --  1.58  --   --   --   --   --   CREATININE 0.68  < >  --   --  1.10*  --  1.27* 1.44*  TROPONINI <0.03  --   --   --   --   --   --   --   < > = values in this interval not displayed.  Estimated Creatinine Clearance: 34.3 mL/min (by C-G formula based on SCr of 1.44 mg/dL).   Assessment: Patient is a 70 yo female who presented 8/14 with sepsis of presumed biliary origin. She was on rivaroxaban PTA for AFib with last dose 8/13PM. She also has h/o APS w/ h/o DVT/PE. She has documented history of HIT from both UFH and enoxaparin. LFTs and T bili were elevated on admission, therefore not using argatroban.   She was started on bivalirudin after [place]  perc chole was completed 8/14.  Patient's aPTT was supratherapeutic at 99s on bivalirudin 0.1mg /kg/hr (7.5mg /hr). Patient's renal function has been progressively declining since admission, which may have contributed to her increased aPTT level. No bleeding noted.   Will decrease rate by 20% and re-check an aPTT in 2 hours.   Goal of Therapy:  Goal aPTT: 50-85 seconds Monitor platelets by anticoagulation protocol: Yes   Plan:  -Decrease bivalirudin rate to 0.08mg /kg/hr (6 mg/h). -F/u aPTT level in 2 hours -Monitor CBC, renal function, and s/sx of bleeding.   Demetrius Charity, PharmD Acute Care Pharmacy Resident  Pager: [phone] 06/06/2016

## 2016-06-06 NOTE — Progress Notes (Signed)
[name] for Bivalirudin  Indication: atrial fibrillation   Allergies  Allergen Reactions  . Hornet Venom Anaphylaxis  . Pork-Derived Products Anaphylaxis  . Chlorhexidine Itching    Reports that it is the dye in it  . Enoxaparin Sodium     REACTION: thrombocytopenia  . Heparin     REACTION: thrombocytopenia  . Lactose Intolerance (Gi)   . Other     Sensitive to dye in Betadine & Chlorohexadine   . Povidone-Iodine     Sensitivity- but if its wiped off she is able to tolerate betadine   . Indomethacin Rash  . Phenylpropanolamine Rash    Other Reaction: other reaction  . Povidone Iodine Rash    Patient Measurements: Height: 5' (152.4 cm) Weight: 174 lb 6.1 oz (79.1 kg) IBW/kg (Calculated) : 45.5  Vital Signs: Temp: 98.6 F (37 C) (08/16 1145) Temp Source: Core (Comment) (08/16 0800) BP: 94/71 (08/16 1145) Pulse Rate: 114 (08/16 1145)  Labs:  Recent Labs  06/04/16 0045  06/04/16 1314  06/05/16 0443 06/05/16 1700 06/05/16 1948 06/06/16 0327 06/06/16 1050  HGB 16.0*  --   --   --  13.5  --   --  11.1*  --   HCT 47.9*  --   --   --  40.1  --   --  32.7*  --   PLT 283  --   --   --  122*  --   --  99*  --   APTT  --   --   --   < > 84* 77*  --  99* 88*  LABPROT  --   --  19.0*  --   --   --   --   --   --   INR  --   --  1.58  --   --   --   --   --   --   CREATININE 0.68  < >  --   --  1.10*  --  1.27* 1.44*  --   TROPONINI <0.03  --   --   --   --   --   --   --   --   < > = values in this interval not displayed.  Estimated Creatinine Clearance: 34.3 mL/min (by C-G formula based on SCr of 1.44 mg/dL).   Assessment: Patient is a 70 yo female who presented 8/14 with sepsis of presumed biliary origin. She was on rivaroxaban PTA for AFib with last dose 8/13PM. She also has h/o APS w/ h/o DVT/PE. She has documented history of HIT from both UFH and enoxaparin. LFTs and T bili were elevated on admission, therefore not using  argatroban.   She was started on bivalirudin after [place] perc chole was completed 8/14.  Patient's aPTT was supratherapeutic at 88 seconds on bivalirudin 0.08 mg/kg/hr (6 mg/hr). Patient's renal function has been progressively declining since admission, which may have contributed to her increased aPTT level. No bleeding noted.   Will decrease rate by 20% and re-check an aPTT in 2 hours.   Goal of Therapy:  Goal aPTT: 50-85 seconds Monitor platelets by anticoagulation protocol: Yes   Plan:  -Decrease bivalirudin rate to 0.064mg /kg/hr (4.8 mg/h). -F/u aPTT level in 2 hours -Monitor CBC, renal function, and s/sx of bleeding.   Demetrius Charity, PharmD Acute Care Pharmacy Resident  Pager: [phone] 06/06/2016

## 2016-06-06 NOTE — Progress Notes (Signed)
Attempted a cortrak tube placement. Patient was able to make concern aware of the placement. Unable to make out exact concern but seemed to be that she didn't have cartilage and had Septum issues. Attempted anyway The right nare was completely blocked. Attempted to pass thru left and thought I was passing but felt like was hitting or rubbing against bone. Called Dr Nelda Marseille and Georgann Housekeeper in at bedside and both attempted as well and were not able to get it. So will defer the tube placement for now and MD may attempt to see if placing under IR is an option.

## 2016-06-06 NOTE — Progress Notes (Signed)
[place] Progress Note Patient Name: Latoya [name] [facility] DOB: Jun 28, 1946 MRN: [id number]   Date of Service  06/06/2016  HPI/Events of Note  K+ = 3.1 and Creatinine = 1.44.   eICU Interventions  Will order:  1. Replace K+.     Intervention Category Intermediate Interventions: Electrolyte abnormality - evaluation and management  [name],[name] 06/06/2016, 4:17 AM

## 2016-06-06 NOTE — Progress Notes (Signed)
Nutrition Brief Note  RD noted consult for enteral feeding initiation/management. Inserted orders from previous RD Note.  Latoya [name]. [name], MS, RD LDN Inpatient Clinical Dietitian Pager [phone]

## 2016-06-07 ENCOUNTER — Inpatient Hospital Stay (HOSPITAL_COMMUNITY): Payer: Medicare Other

## 2016-06-07 LAB — APTT
APTT: 65 s — AB (ref 24–36)
aPTT: 69 seconds — ABNORMAL HIGH (ref 24–36)
aPTT: 57 seconds — ABNORMAL HIGH (ref 24–36)

## 2016-06-07 LAB — CBC
HEMATOCRIT: 31.7 % — AB (ref 36.0–46.0)
Hemoglobin: 10.8 g/dL — ABNORMAL LOW (ref 12.0–15.0)
MCH: 28.3 pg (ref 26.0–34.0)
MCHC: 34.1 g/dL (ref 30.0–36.0)
MCV: 83 fL (ref 78.0–100.0)
Platelets: 101 10*3/uL — ABNORMAL LOW (ref 150–400)
RBC: 3.82 MIL/uL — AB (ref 3.87–5.11)
RDW: 14.6 % (ref 11.5–15.5)
WBC: 30.5 10*3/uL — AB (ref 4.0–10.5)

## 2016-06-07 LAB — BASIC METABOLIC PANEL
Anion gap: 14 (ref 5–15)
BUN: 47 mg/dL — ABNORMAL HIGH (ref 6–20)
CHLORIDE: 109 mmol/L (ref 101–111)
CO2: 17 mmol/L — AB (ref 22–32)
CREATININE: 2.3 mg/dL — AB (ref 0.44–1.00)
Calcium: 8 mg/dL — ABNORMAL LOW (ref 8.9–10.3)
GFR calc non Af Amer: 21 mL/min — ABNORMAL LOW (ref >60)
GFR, EST AFRICAN AMERICAN: 24 mL/min — AB (ref >60)
Glucose, Bld: 136 mg/dL — ABNORMAL HIGH (ref 65–99)
POTASSIUM: 3.2 mmol/L — AB (ref 3.5–5.1)
SODIUM: 140 mmol/L (ref 135–145)

## 2016-06-07 LAB — CULTURE, BLOOD (ROUTINE X 2)

## 2016-06-07 LAB — GLUCOSE, CAPILLARY
GLUCOSE-CAPILLARY: 121 mg/dL — AB (ref 65–99)
GLUCOSE-CAPILLARY: 152 mg/dL — AB (ref 65–99)
Glucose-Capillary: 119 mg/dL — ABNORMAL HIGH (ref 65–99)
Glucose-Capillary: 121 mg/dL — ABNORMAL HIGH (ref 65–99)
Glucose-Capillary: 184 mg/dL — ABNORMAL HIGH (ref 65–99)
Glucose-Capillary: 190 mg/dL — ABNORMAL HIGH (ref 65–99)

## 2016-06-07 LAB — BLOOD GAS, ARTERIAL
ACID-BASE DEFICIT: 9 mmol/L — AB (ref 0.0–2.0)
BICARBONATE: 15.8 meq/L — AB (ref 20.0–24.0)
Drawn by: 460981
FIO2: 0.4
LHR: 30 {breaths}/min
O2 Saturation: 98.2 %
PEEP/CPAP: 5 cmH2O
PO2 ART: 122 mmHg — AB (ref 80.0–100.0)
Patient temperature: 98.6
TCO2: 16.7 mmol/L (ref 0–100)
VT: 4000 mL
pCO2 arterial: 30.8 mmHg — ABNORMAL LOW (ref 35.0–45.0)
pH, Arterial: 7.33 — ABNORMAL LOW (ref 7.350–7.450)

## 2016-06-07 LAB — URINALYSIS, ROUTINE W REFLEX MICROSCOPIC
Bilirubin Urine: NEGATIVE
GLUCOSE, UA: NEGATIVE mg/dL
KETONES UR: NEGATIVE mg/dL
Nitrite: NEGATIVE
PROTEIN: 30 mg/dL — AB
Specific Gravity, Urine: >1.03 — ABNORMAL HIGH (ref 1.005–1.030)
pH: 5.5 (ref 5.0–8.0)

## 2016-06-07 LAB — URINE MICROSCOPIC-ADD ON

## 2016-06-07 LAB — PHOSPHORUS: PHOSPHORUS: 4 mg/dL (ref 2.5–4.6)

## 2016-06-07 LAB — TRIGLYCERIDES: TRIGLYCERIDES: 211 mg/dL — AB (ref <150)

## 2016-06-07 LAB — MAGNESIUM: MAGNESIUM: 3.3 mg/dL — AB (ref 1.7–2.4)

## 2016-06-07 MED ORDER — SODIUM BICARBONATE 8.4 % IV SOLN
150.0000 meq | INTRAVENOUS | Status: DC
  Administered 2016-06-07 – 2016-06-08 (×3): 150 meq via INTRAVENOUS
  Filled 2016-06-07 (×6): qty 850

## 2016-06-07 MED ORDER — INSULIN ASPART 100 UNIT/ML ~~LOC~~ SOLN
0.0000 [IU] | SUBCUTANEOUS | Status: DC
  Administered 2016-06-07 – 2016-06-08 (×6): 2 [IU] via SUBCUTANEOUS
  Administered 2016-06-09 (×2): 1 [IU] via SUBCUTANEOUS
  Administered 2016-06-09: 3 [IU] via SUBCUTANEOUS
  Administered 2016-06-09 – 2016-06-11 (×10): 2 [IU] via SUBCUTANEOUS
  Administered 2016-06-11 (×2): 1 [IU] via SUBCUTANEOUS
  Administered 2016-06-11: 2 [IU] via SUBCUTANEOUS
  Administered 2016-06-11: 1 [IU] via SUBCUTANEOUS
  Administered 2016-06-11 – 2016-06-12 (×2): 2 [IU] via SUBCUTANEOUS
  Administered 2016-06-12 (×2): 1 [IU] via SUBCUTANEOUS
  Administered 2016-06-12 (×2): 2 [IU] via SUBCUTANEOUS
  Administered 2016-06-12 – 2016-06-13 (×2): 1 [IU] via SUBCUTANEOUS
  Administered 2016-06-15 (×2): 2 [IU] via SUBCUTANEOUS
  Administered 2016-06-16 – 2016-06-17 (×9): 1 [IU] via SUBCUTANEOUS
  Administered 2016-06-18: 2 [IU] via SUBCUTANEOUS
  Administered 2016-06-18: 1 [IU] via SUBCUTANEOUS
  Administered 2016-06-18: 2 [IU] via SUBCUTANEOUS
  Administered 2016-06-18 (×2): 1 [IU] via SUBCUTANEOUS
  Administered 2016-06-19: 2 [IU] via SUBCUTANEOUS
  Administered 2016-06-19: 1 [IU] via SUBCUTANEOUS
  Administered 2016-06-19: 2 [IU] via SUBCUTANEOUS
  Administered 2016-06-19 – 2016-06-20 (×3): 1 [IU] via SUBCUTANEOUS

## 2016-06-07 MED ORDER — LIDOCAINE VISCOUS 2 % MT SOLN
15.0000 mL | Freq: Once | OROMUCOSAL | Status: AC
  Administered 2016-06-07: 5 mL via OROMUCOSAL
  Filled 2016-06-07: qty 15

## 2016-06-07 MED ORDER — PROPOFOL 1000 MG/100ML IV EMUL
5.0000 ug/kg/min | INTRAVENOUS | Status: DC
  Administered 2016-06-07: 20 ug/kg/min via INTRAVENOUS

## 2016-06-07 MED ORDER — SODIUM CHLORIDE 0.9 % IV SOLN
3.0000 g | Freq: Two times a day (BID) | INTRAVENOUS | Status: DC
  Administered 2016-06-07 – 2016-06-10 (×7): 3 g via INTRAVENOUS
  Filled 2016-06-07 (×9): qty 3

## 2016-06-07 MED ORDER — HYDROCORTISONE NA SUCCINATE PF 100 MG IJ SOLR
25.0000 mg | Freq: Four times a day (QID) | INTRAMUSCULAR | Status: DC
  Administered 2016-06-07 – 2016-06-08 (×3): 25 mg via INTRAVENOUS
  Filled 2016-06-07 (×4): qty 0.5

## 2016-06-07 MED ORDER — DIATRIZOATE MEGLUMINE & SODIUM 66-10 % PO SOLN
ORAL | Status: AC
  Filled 2016-06-07: qty 30

## 2016-06-07 MED ORDER — MIDAZOLAM HCL 2 MG/2ML IJ SOLN
2.0000 mg | INTRAMUSCULAR | Status: DC | PRN
  Administered 2016-06-07 – 2016-06-09 (×5): 2 mg via INTRAVENOUS
  Filled 2016-06-07 (×5): qty 2

## 2016-06-07 MED ORDER — POTASSIUM CHLORIDE 20 MEQ/15ML (10%) PO SOLN
40.0000 meq | Freq: Once | ORAL | Status: AC
  Administered 2016-06-07: 40 meq
  Filled 2016-06-07: qty 30

## 2016-06-07 MED ORDER — LIDOCAINE VISCOUS 2 % MT SOLN
OROMUCOSAL | Status: AC
  Administered 2016-06-07: 5 mL via OROMUCOSAL
  Filled 2016-06-07: qty 15

## 2016-06-07 NOTE — Progress Notes (Addendum)
PULMONARY / CRITICAL CARE MEDICINE   Name: Latoya [name] MRN: [id number] DOB: 1946/03/17    ADMISSION DATE:  06/04/2016  CHIEF COMPLAINT:  Abdominal Pain  HISTORY OF PRESENT ILLNESS:   Latoya [name] is a 70 y/o woman with MMP, most notable for antiphospholipid syndrome on AC (hx of one provoked DVT), as well as CAD and asthma who presented to the ED with sudden onset of chills and abdominal pain. In the ED her clinical status declined, becoming less responsive, more tachycardic and more tachypneic. Imaging studies suggested an enlarged gallbladder.  SUBJECTIVE:  Off pressors and more alert and interactive but agitated overnight.  VITAL SIGNS: BP 115/66 (BP Location: Right Arm)   Pulse 85   Temp 97.7 F (36.5 C)   Resp (!) 30   Ht 5' (1.524 m)   Wt 81.7 kg (180 lb 1.9 oz)   SpO2 100%   BMI 35.18 kg/m    HEMODYNAMICS: CVP:  [7 mmHg-12 mmHg] 8 mmHg  VENTILATOR SETTINGS: Vent Mode: PRVC FiO2 (%):  [40 %-60 %] 40 % Set Rate:  [30 bmp] 30 bmp Vt Set:  [400 mL] 400 mL PEEP:  [5 cmH20] 5 cmH20 Plateau Pressure:  [10 cmH20-19 cmH20] 19 cmH20  INTAKE / OUTPUT: I/O last 3 completed shifts: In: 4483.5 [I.V.:3673.5; Other:70; NG/GT:140; IV Piggyback:600] Out: 1560 [Urine:385; Emesis/NG output:875; Drains:300]  PHYSICAL EXAMINATION: General:  Elderly woman, chronically ill appearing, awake and interactive on vent, off propofol. Neuro:  Awake and interactive, moving all ext to command. HEENT:  ETT, Latoya [name]/AT, PERRL, EOM-spontaneous. Cardiovascular:  Tachycardic, sinus, Nl S1/S2, -M/R/G. Lungs:  Coarse BS diffusely. Abdomen:  Obese, NT, ND and +BS, unresponsive. Musculoskeletal:  No joint swelling Skin:  No visible rashes  LABS:  BMET  Recent Labs Lab 06/05/16 1948 06/06/16 0327 06/07/16 0415  NA 140 140 140  K 3.4* 3.1* 3.2*  CL 111 109 109  CO2 19* 20* 17*  BUN 35* 38* 47*  CREATININE 1.27* 1.44* 2.30*  GLUCOSE 137* 188* 136*   Electrolytes  Recent Labs Lab  06/05/16 0443 06/05/16 1948 06/06/16 0327 06/07/16 0415  CALCIUM 8.0* 7.5* 7.6* 8.0*  MG 1.3* 3.3* 3.3* 3.3*  PHOS 5.5*  --  3.7 4.0   CBC  Recent Labs Lab 06/05/16 0443 06/06/16 0327 06/07/16 0415  WBC 32.0* 27.0* 30.5*  HGB 13.5 11.1* 10.8*  HCT 40.1 32.7* 31.7*  PLT 122* 99* 101*   Coag's  Recent Labs Lab 06/04/16 1314  06/06/16 2210 06/07/16 0415 06/07/16 0745  APTT  --   < > 74* 69* 65*  INR 1.58  --   --   --   --   < > = values in this interval not displayed. Sepsis Markers  Recent Labs Lab 06/04/16 0103 06/04/16 0332 06/04/16 0513  LATICACIDVEN 6.88* 6.06* 6.95*   ABG  Recent Labs Lab 06/05/16 0354 06/06/16 0321 06/07/16 0604  PHART 7.327* 7.498* 7.330*  PCO2ART 33.6* 22.1* 30.8*  PO2ART 81.1 59.0* 122*   Liver Enzymes  Recent Labs Lab 06/04/16 0045 06/04/16 0520 06/06/16 0327  AST 500* 752* 115*  ALT 291* 486* 218*  ALKPHOS 138* 129* 112  BILITOT 2.7* 2.5* 2.8*  ALBUMIN 4.5 3.4* 2.5*   Cardiac Enzymes  Recent Labs Lab 06/04/16 0045  TROPONINI <0.03   Glucose  Recent Labs Lab 06/06/16 1103 06/06/16 1546 06/06/16 2002 06/06/16 2324 06/07/16 0331 06/07/16 0754  GLUCAP 131* 133* 113* 119* 119* 121*   Imaging Dg Chest Port 1 View  Result  Date: 06/07/2016 CLINICAL DATA:  70 y/o  F; cholecystitis. EXAM: PORTABLE CHEST 1 VIEW COMPARISON:  Chest x-ray dated 06/06/2016. FINDINGS: Postsurgical changes related to cervical and thoracolumbar fixation partially visualized. Central venous catheter tip projects over the lower SVC. Endotracheal tube is stable in position. Enteric tube tip projects over mid stomach. Worsening right lung volumes and basilar opacity. Stable left basilar opacity. No pneumothorax. Cardiac silhouette is obscured by basilar opacities. IMPRESSION: Bibasilar opacities increased on the right probably represent atelectasis and effusions. Underlying pneumonia is not excluded. Electronically Signed   By: Kristine Garbe M.D.   On: 06/07/2016 05:33   Dg Latoya [name] G Tube Plc W/fl-no Rad  Result Date: 06/07/2016 CLINICAL DATA:  NASO G TUBE PLACEMENT WITH FLUORO Fluoroscopy was utilized by the requesting physician.  No radiographic interpretation.   STUDIES:  CTA ABD with enlarged galbladder and enlarging hepatic cyst. RUQ [place] noted  CULTURES: Blood 8/14 >>E. Coli and GPC (speciation pending). Urine 8/14 >>NTD.  ANTIBIOTICS: Vanc 8/14 >> 8/16 Zosyn 8/14 >>  SIGNIFICANT EVENTS: Intubated in ED after initial evaluation IR perc drain 8/14  LINES/TUBES: ETT 7.5 mm 8/14>>> Foley 8/14>>> R IJ TLC 8/14>>> Gall bladder drain 8/14>>>  DISCUSSION: 70 y/o woman with severe sepsis of presumed biliary origin  ASSESSMENT / PLAN:  PULMONARY A: Need for Mechanical Ventilation Moderate persistent asthma P:   Begin PS trials, no extubation until able to diurese more. ABG and CXR in AM. Nebulized budesonide, ipratropium, and albuterol for asthma.  CARDIOVASCULAR A:  CAD. HTN. PAF. Evolving septic shock. A-fib with RVR. P:  CVP noted, hold further IVF. Pressors off. Stress dose steroids, decrease to half dose. Hold all anti-HTN. Amiodarone drip with bolus. Lasix drip.  RENAL A:   Lactic Acidosis P:   Replace electrolytes as indicated. CVP noted. KVO IVF. BMET in AM. D/C lasix. Bicarb drip. U/A. Renal consult called.  GASTROINTESTINAL A:   Abnormal LFTs Probable cholangitis P:   Drain placed in by IR. Post pyloric cor-trak. TF per nutrition. No MRCP due to large burden of metallic objects in the patient's body.  HEMATOLOGIC A:   Hx of Antiphospholipid Ab Syndrome Hx of provoked DVT On rivaroxaban Hx of allergy to Heparin products P:  Bivalirudin. CBC in AM. Monitor coags.  INFECTIOUS A:   Severe sepsis, probable biliary origin P:   Continue abx (zosyn), Vanc off. PTC per IR Awaiting surgery's input.  ENDOCRINE A:   Hypothyroidism P:   Cont  home syndroid. Cortisol level 59. Stress dose steroids, will continue for now until more hemodynamically stable.  NEUROLOGIC A:   Need for sedation P:   D/C Propfol. RASS goal: 0 Fentanyl drip.  FAMILY  - Updates: Husband updated bedside, ok to diurese.  - Inter-disciplinary family meet or Palliative Care meeting due by:  8/21  The patient is critically ill with multiple organ systems failure and requires high complexity decision making for assessment and support, frequent evaluation and titration of therapies, application of advanced monitoring technologies and extensive interpretation of multiple databases.   Critical Care Time devoted to patient care services described in this note is  35  Minutes. This time reflects time of care of this signee Dr Jennet Maduro. This critical care time does not reflect procedure time, or teaching time or supervisory time of PA/NP/Med student/Med Resident etc but could involve care discussion time.  Rush Farmer, M.D. [facility] Pulmonary/Critical Care Medicine. Pager: [phone]. After hours pager: [phone].  06/07/2016, 10:23 AM

## 2016-06-07 NOTE — Progress Notes (Signed)
Patient ID: Latoya [name], female   DOB: 01-01-1946, 70 y.o.   MRN: [id number]          [facility]    Date of Admission:  06/04/2016           Day 4 piperacillin tazobactam  All 3 of her blood culture isolates are sensitive to ampicillin sulbactam allowing me to narrow her antibiotic coverage now.         Michel Bickers, MD [facility] [phone] pager   [phone] cell 10/25/2015, 1:32 PM

## 2016-06-07 NOTE — Progress Notes (Signed)
[place] for Bivalirudin  Indication: atrial fibrillation   Allergies  Allergen Reactions  . Hornet Venom Anaphylaxis  . Pork-Derived Products Anaphylaxis  . Chlorhexidine Itching    Reports that it is the dye in it  . Enoxaparin Sodium     REACTION: thrombocytopenia  . Heparin     REACTION: thrombocytopenia  . Lactose Intolerance (Gi)   . Other     Sensitive to dye in Betadine & Chlorohexadine   . Povidone-Iodine     Sensitivity- but if its wiped off she is able to tolerate betadine   . Indomethacin Rash  . Phenylpropanolamine Rash    Other Reaction: other reaction  . Povidone Iodine Rash    Patient Measurements: Height: 5' (152.4 cm) Weight: 180 lb 1.9 oz (81.7 kg) IBW/kg (Calculated) : 45.5  Vital Signs: Temp: 98.1 F (36.7 C) (08/17 0600) Temp Source: Core (Comment) (08/17 0000) BP: 100/53 (08/17 0600) Pulse Rate: 81 (08/17 0600)  Labs:  Recent Labs  06/04/16 1314  06/05/16 0443  06/05/16 1948 06/06/16 0327  06/06/16 2210 06/07/16 0415 06/07/16 0745  HGB  --   < > 13.5  --   --  11.1*  --   --  10.8*  --   HCT  --   --  40.1  --   --  32.7*  --   --  31.7*  --   PLT  --   --  122*  --   --  99*  --   --  101*  --   APTT  --   < > 84*  < >  --  99*  < > 74* 69* 65*  LABPROT 19.0*  --   --   --   --   --   --   --   --   --   INR 1.58  --   --   --   --   --   --   --   --   --   CREATININE  --   < > 1.10*  --  1.27* 1.44*  --   --  2.30*  --   < > = values in this interval not displayed.  Estimated Creatinine Clearance: 21.9 mL/min (by C-G formula based on SCr of 2.3 mg/dL).   Assessment: Patient is a 70 yo female who presented 8/14 with sepsis of presumed biliary origin. She was on rivaroxaban PTA for AFib with last dose 8/13PM. She also has h/o APS w/ h/o DVT/PE. She has documented history of HIT from both UFH and enoxaparin. LFTs and T bili were elevated on admission, therefore not using argatroban.   She was  started on bivalirudin after [place] perc chole was completed 8/14.  Patient's second 4-hour aPTT was therapeutic at 65 seconds on bivalirudin 0.041 mg/kg/hr (3.2  Mg/hr). Hgb 10.8 and pltc 101, low but stable. No bleeding noted per nurse.   Will continue current rate and check aPTT in 12 hours  Goal of Therapy:  Goal aPTT: 50-85 seconds Monitor platelets by anticoagulation protocol: Yes   Plan:  -Continue bivalirudin rate to 0.041 mg/kg/hr (3.2 mg/h). -Monitor aPTT every 12 hours.  -Monitor CBC, renal function, and s/sx of bleeding.   Demetrius Charity, PharmD Acute Care Pharmacy Resident  Pager: [phone] 06/07/2016

## 2016-06-07 NOTE — Progress Notes (Signed)
Patient ID: Latoya [name], female   DOB: 10-16-1946, 70 y.o.   MRN: [id number]   LOS: 3 days   Subjective: Sedated, awake on vent   Objective: Vital signs in last 24 hours: Temp:  [97.5 F (36.4 C)-98.6 F (37 C)] 97.7 F (36.5 C) (08/17 1000) Pulse Rate:  [68-134] 85 (08/17 1000) Resp:  [8-30] 30 (08/17 1000) BP: (74-157)/(46-107) 115/66 (08/17 1000) SpO2:  [99 %-100 %] 100 % (08/17 1000) FiO2 (%):  [40 %-60 %] 40 % (08/17 0906) Weight:  [81.7 kg (180 lb 1.9 oz)] 81.7 kg (180 lb 1.9 oz) (08/17 0356) Last BM Date:  (PTA)   Cholecystostomy: 241ml/24h   Laboratory  CBC  Recent Labs  06/06/16 0327 06/07/16 0415  WBC 27.0* 30.5*  HGB 11.1* 10.8*  HCT 32.7* 31.7*  PLT 99* 101*   BMET  Recent Labs  06/06/16 0327 06/07/16 0415  NA 140 140  K 3.1* 3.2*  CL 109 109  CO2 20* 17*  GLUCOSE 188* 136*  BUN 38* 47*  CREATININE 1.44* 2.30*  CALCIUM 7.6* 8.0*    Physical Exam General appearance: no distress Resp: clear to auscultation bilaterally Cardio: regular rate and rhythm GI: Soft, quiet   Assessment/Plan: GB disease -- Cannot receive MRCP 2/2 internal hardware burden. HIDA would give some information, may be next best step. Will d/w MD. Increasing WBC and Cr worrisome.   Lisette Abu, PA-C Pager: [phone] 06/07/2016

## 2016-06-07 NOTE — Progress Notes (Signed)
[place] Progress Note Patient Name: Latoya [name] [facility] DOB: 02-May-1946 MRN: [id number]   Date of Service  06/07/2016  HPI/Events of Note  Extreme agitation despite fentanyl @ 300 mcg/hr and PRN midaz  eICU Interventions  Propofol infusion initiated     Intervention Category Minor Interventions: Agitation / anxiety - evaluation and management  Wilhelmina Mcardle 06/07/2016, 4:55 AM

## 2016-06-07 NOTE — Progress Notes (Signed)
[place] Progress Note Patient Name: Latoya [name] DOB: [birth year] MRN: [id number]   Date of Service  06/07/2016  HPI/Events of Note  High blood sugars  eICU Interventions  SSI coverage     Intervention Category Evaluation Type: Other  Denell Cothern 06/07/2016, 9:12 PM

## 2016-06-07 NOTE — Consult Note (Signed)
Referring Provider: No ref. provider found Primary Care Physician:  Thressa Sheller, MD Primary Nephrologist:    Reason for Consultation:   Acute oliguric renal failure . Shock secondary to sepsis   [id number] Latoya [name] is a 70 y.o. female who was admitted 3 days ago with abdominal pain, lethargy and septic shock. She had a temperature of 103 on admission. CT scan revealed a distended gallbladder with pericholecystic fluid and biliary ductal dilatation. Admission blood cultures have grown enterococcus, Escherichia coli and Klebsiella. She underwent percutaneous cholecystostomy. Gallbladder fluid is growing gram-negative rods and enterococcus faecium. Antibiotic susceptibilities pending. She has defervesced.  Past Medical History:  Diagnosis Date  . Anemia, iron deficiency   . Antiphospholipid syndrome (HCC)    with hypercoagulable state  . Asthma    extrinsic; moderate, persistant, nml spirometry 2010, nml CXR 1/08  . Bladder troubles    REPORTS INFECTIONS ON OCCASION DUE TO URETHRA MEATUS STRICTURE AT BIRTH   . Blood dyscrasia    antiphospholipid disorder  . CAD (coronary artery disease)    50% mid LAD, 80% ostial D1 and moderate 80% mid circ by vath 2003  . Cancer ([place])    basal cell removed fr. L arm   . Complication of anesthesia    cardiac arrest- in OR, at age 20 (37)y.o. during Scalenotomy in her early 20's  . Coronary heart disease   . Drug allergy    heparin/lovenox  . DVT (deep venous thrombosis) ([place])   . Erosive gastritis 1994  . Family history of adverse reaction to anesthesia    some family members have had trouble waking up  . GERD (gastroesophageal reflux disease)   . H/O hiatal hernia   . HLD (hyperlipidemia)   . HTN (hypertension)    [name] at [facility], manages pt.  LOV 03/2014  . Hypothyroidism   . Liver spot    cyst- - no biopsy, but told that its benign   . PAF (paroxysmal atrial fibrillation) (HCC)    not on coumadin therapy  . Pulmonary embolism ([place])     related to back surgery with prior coumadin use, now off  . Spondylosis, lumbosacral    ARTHRITIS- OA     Past Surgical History:  Procedure Laterality Date  . ABDOMINAL HYSTERECTOMY     partial abdominal -1998  . BACK SURGERY     x13 cervical and lumbar thoracic spine surgery  . BREAST ENHANCEMENT SURGERY    . BREAST SURGERY     all benign cysts x3   . CARDIAC CATHETERIZATION     2005  . cleft lip and palate repair     70 yo   . hysterectomy    . IR GENERIC HISTORICAL  06/04/2016   IR PERC CHOLECYSTOSTOMY 06/04/2016 Greggory Keen, MD [facility]-INTERV RAD  . JOINT REPLACEMENT    . POSTERIOR CERVICAL FUSION/FORAMINOTOMY Right 08/31/2014   Procedure: Right cervical six-seven foraminotomy, Cervical five-Thoracic one fusion, Cervical six-seven lateral mass screws;  Surgeon: Erline Levine, MD;  Location: [facility];  Service: Neurosurgery;  Laterality: Right;  . spina bifida repair     70 yo   . TONSILLECTOMY    . TOTAL KNEE ARTHROPLASTY     left    Prior to Admission medications   Medication Sig Start Date End Date Taking? Authorizing Provider  albuterol (PROVENTIL HFA;VENTOLIN HFA) 108 (90 BASE) MCG/ACT inhaler Inhale 2 puffs into the lungs every 6 (six) hours as needed for wheezing or shortness of breath.    Yes Historical Provider,  MD  aspirin 81 MG EC tablet Take 81 mg by mouth daily.    Yes Historical Provider, MD  atorvastatin (LIPITOR) 40 MG tablet Take 40 mg by mouth daily.   Yes Historical Provider, MD  azelastine (ASTELIN) 137 MCG/SPRAY nasal spray Place 1 spray into the nose 2 (two) times daily. Use in each nostril as directed For allergies   Yes Historical Provider, MD  beclomethasone (QVAR) 80 MCG/ACT inhaler Inhale 2 puffs into the lungs 2 (two) times daily. Patient taking differently: Inhale 2 puffs into the lungs 3 (three) times daily.  06/24/14  Yes Elsie Stain, MD  Calcium Carb-Cholecalciferol (CALCIUM 500 +D) 500-400 MG-UNIT TABS Take 1 tablet by mouth 2 (two) times  daily.   Yes Historical Provider, MD  cyclobenzaprine (FLEXERIL) 10 MG tablet Take 10 mg by mouth 3 (three) times daily as needed. For severe muscle pain   Yes Historical Provider, MD  diazepam (VALIUM) 5 MG tablet Take 5 mg by mouth at bedtime as needed for anxiety or sedation.    Yes Historical Provider, MD  docusate sodium (COLACE) 100 MG capsule Take 200 mg by mouth 2 (two) times daily.    Yes Historical Provider, MD  EPINEPHrine (EPIPEN) 0.3 mg/0.3 mL DEVI Inject 0.3 mLs (0.3 mg total) into the muscle once. Patient taking differently: Inject 0.3 mg into the muscle once as needed.  04/12/13  Yes Christina P Rama, MD  esomeprazole (NEXIUM) 40 MG capsule Take 40 mg by mouth 2 (two) times daily.    Yes Historical Provider, MD  FentaNYL 37.5 MCG/HR PT72 Place 1 patch onto the skin every 3 (three) days.   Yes Historical Provider, MD  Glucosamine-Chondroitin 750-600 MG TABS Take 1 tablet by mouth 2 (two) times daily.   Yes Historical Provider, MD  HYDROmorphone (DILAUDID) 4 MG tablet Take 4-8 mg by mouth every 4 (four) hours as needed. For pain   Yes Historical Provider, MD  levothyroxine (SYNTHROID, LEVOTHROID) 75 MCG tablet Take 75 mcg by mouth daily before breakfast.    Yes Historical Provider, MD  lidocaine (LIDODERM) 5 % Place 1 patch onto the skin daily. Remove & Discard patch within 12 hours or as directed by MD   Yes Historical Provider, MD  Loratadine-Pseudoephedrine (CLARITIN-D 12 HOUR PO) Take 1 tablet by mouth 2 (two) times daily.    Yes Historical Provider, MD  Melatonin 2.5 MG CAPS Take 2.5-5 mg by mouth at bedtime.   Yes Historical Provider, MD  metaxalone (SKELAXIN) 800 MG tablet Take 800 mg by mouth every 8 (eight) hours as needed. For muscle pain   Yes Historical Provider, MD  metoprolol (LOPRESSOR) 50 MG tablet Take 1 tablet (50 mg total) by mouth 2 (two) times daily. 11/09/15  Yes Burtis Junes, NP  montelukast (SINGULAIR) 10 MG tablet Take 10 mg by mouth at bedtime.    Yes  Historical Provider, MD  Multiple Vitamin (MULTIVITAMIN WITH MINERALS) TABS Take 1 tablet by mouth daily.   Yes Historical Provider, MD  NITROSTAT 0.4 MG SL tablet DISSOLVE 1 TABLET UNDER TONGUE AS NEEDED FOR CHEST PAIN,MAY REPEAT IN5 MINUTES FOR 2 DOSES. 02/24/16  Yes Burnell Blanks, MD  Respiratory Therapy Supplies (FLUTTER) DEVI Use as directed 06/06/15  Yes Tanda Rockers, MD  rivaroxaban (XARELTO) 20 MG TABS tablet Take 1 tablet (20 mg total) by mouth daily with supper. 12/21/15  Yes Burnell Blanks, MD  triamcinolone (NASACORT) 55 MCG/ACT nasal inhaler Place 1-2 sprays into the nose 2 (two)  times daily.    Yes Historical Provider, MD  atorvastatin (LIPITOR) 20 MG tablet Take 1 tablet (20 mg total) by mouth daily. Patient not taking: Reported on 06/04/2016 11/09/15   Burtis Junes, NP    Current Facility-Administered Medications  Medication Dose Route Frequency Provider Last Rate Last Dose  . 0.9 %  sodium chloride infusion  250 mL Intravenous PRN Luz Brazen, MD 10 mL/hr at 06/06/16 1421 250 mL at 06/06/16 1421  . 0.9 %  sodium chloride infusion   Intravenous Continuous Rush Farmer, MD 50 mL/hr at 06/07/16 0856    . amiodarone (NEXTERONE PREMIX) 360-4.14 MG/200ML-% (1.8 mg/mL) IV infusion  30 mg/hr Intravenous Continuous Rush Farmer, MD 16.7 mL/hr at 06/07/16 1000 30 mg/hr at 06/07/16 1000  . antiseptic oral rinse solution (CORINZ)  7 mL Mouth Rinse Q4H Luz Brazen, MD   7 mL at 06/07/16 0914  . bivalirudin (ANGIOMAX) 250 mg in sodium chloride 0.9 % 500 mL (0.5 mg/mL) infusion  0.041 mg/kg/hr Intravenous Continuous Rush Farmer, MD 6.2 mL/hr at 06/07/16 1000 0.041 mg/kg/hr at 06/07/16 1000  . budesonide (PULMICORT) nebulizer solution 0.25 mg  0.25 mg Nebulization BID Luz Brazen, MD   0.25 mg at 06/07/16 0903  . feeding supplement (VITAL HIGH PROTEIN) liquid 1,000 mL  1,000 mL Per Tube Q24H Rush Farmer, MD   1,000 mL at 06/07/16 1034  . fentaNYL (SUBLIMAZE) 2,500  mcg in sodium chloride 0.9 % 250 mL (10 mcg/mL) infusion  25-300 mcg/hr Intravenous Continuous Javier Glazier, MD 12.5 mL/hr at 06/07/16 1033 125 mcg/hr at 06/07/16 1033  . hydrocortisone sodium succinate (SOLU-CORTEF) 100 MG injection 25 mg  25 mg Intravenous Q6H Rush Farmer, MD      . ipratropium-albuterol (DUONEB) 0.5-2.5 (3) MG/3ML nebulizer solution 3 mL  3 mL Nebulization Q6H Luz Brazen, MD   3 mL at 06/07/16 0903  . levothyroxine (SYNTHROID, LEVOTHROID) injection 37 mcg  37 mcg Intravenous Daily Rush Farmer, MD   37 mcg at 06/07/16 0917  . lidocaine (XYLOCAINE) 1 % (with pres) injection    PRN Greggory Keen, MD   10 mL at 06/04/16 1608  . midazolam (VERSED) injection 2 mg  2 mg Intravenous Q4H PRN Marshell Garfinkel, MD   2 mg at 06/07/16 0749  . pantoprazole (PROTONIX) injection 40 mg  40 mg Intravenous Q12H Rush Farmer, MD   40 mg at 06/07/16 0918  . piperacillin-tazobactam (ZOSYN) IVPB 3.375 g  3.375 g Intravenous Q8H Franky Macho, RPH   3.375 g at 06/07/16 0507  . propofol (DIPRIVAN) 1000 MG/100ML infusion  5-80 mcg/kg/min Intravenous Titrated Luz Brazen, MD   Stopped at 06/04/16 1007  . propofol (DIPRIVAN) 1000 MG/100ML infusion  5-80 mcg/kg/min Intravenous Titrated Wilhelmina Mcardle, MD   Stopped at 06/07/16 1025    Allergies as of 06/03/2016 - Review Complete 06/03/2016  Allergen Reaction Noted  . Hornet venom Anaphylaxis 05/04/2013  . Pork-derived products Anaphylaxis 04/02/2012  . Chlorhexidine Itching 08/31/2014  . Enoxaparin sodium    . Heparin  11/25/2008  . Lactose intolerance (gi)  04/02/2012  . Other  09/26/2012  . Povidone-iodine    . Indomethacin Rash   . Phenylpropanolamine Rash 04/21/2014  . Povidone iodine Rash 04/21/2014    Family History  Problem Relation Age of Onset  . Heart disease Father     MI   . Heart disease Sister   .       Social History  Social History  . Marital status: Married    Spouse name: N/A  . Number of children:  N/A  . Years of education: N/A   Occupational History  . Not on file.   Social History Main Topics  . Smoking status: Never Smoker  . Smokeless tobacco: Never Used     Comment: no smoking   . Alcohol use No  . Drug use: No  . Sexual activity: Not on file   Other Topics Concern  . Not on file   Social History Narrative   Lives in [place] with husband; has had miscarriages but no children.    Disabled but exercises nearly every day (can only exericse in water - aerobics)   Takes opioids for pain relief; takes no herbal medications; has a heart healthy diet.     Review of Systems: Patient sedated and ventilated -- unable to review systems   Physical Exam: Vital signs in last 24 hours: Temp:  [97.5 F (36.4 C)-98.6 F (37 C)] 97.7 F (36.5 C) (08/17 1000) Pulse Rate:  [68-131] 85 (08/17 1000) Resp:  [8-30] 30 (08/17 1000) BP: (74-157)/(46-89) 115/66 (08/17 1000) SpO2:  [99 %-100 %] 100 % (08/17 1000) FiO2 (%):  [40 %-60 %] 40 % (08/17 0906) Weight:  [81.7 kg (180 lb 1.9 oz)] 81.7 kg (180 lb 1.9 oz) (08/17 0356) Last BM Date:  (PTA) General:    Ill appearing and intubated  Head:  Normocephalic and atraumatic. Eyes:  Sclera clear, no icterus.   Conjunctiva pink. Ears:  Normal auditory acuity. Nose:  No deformity, discharge,  or lesions. Mouth:  No deformity or lesions, dentition normal. Neck:  Supple; no masses or thyromegaly. JVP not elevated Lungs:   Currently ventilator dependent. Heart:  Regular rate and rhythm; no murmurs, clicks, rubs,  or gallops. Abdomen:  Soft, nontender and nondistended. No masses, hepatosplenomegaly or hernias noted. Normal bowel sounds, without guarding, and without rebound.   Msk:  Symmetrical without gross deformities. Normal posture. Pulses:  No carotid, renal, femoral bruits. DP and PT symmetrical and equal Extremities:  Without clubbing or edema. Neurologic:  Alert and  oriented x4;  grossly normal neurologically. Skin:  Intact without  significant lesions or rashes. .  Intake/Output from previous day: 08/16 0701 - 08/17 0700 In: 2610.4 [I.V.:2255.4; NG/GT:60; IV Piggyback:225] Out: 910 [Urine:195; Emesis/NG output:500; Drains:215] Intake/Output this shift: Total I/O In: 208.4 [I.V.:183.4; IV Piggyback:25] Out: 102 [Urine:62; Drains:40]  Lab Results:  Recent Labs  06/05/16 0443 06/06/16 0327 06/07/16 0415  WBC 32.0* 27.0* 30.5*  HGB 13.5 11.1* 10.8*  HCT 40.1 32.7* 31.7*  PLT 122* 99* 101*   BMET  Recent Labs  06/05/16 0443 06/05/16 1948 06/06/16 0327 06/07/16 0415  NA 143 140 140 140  Latoya 3.0* 3.4* 3.1* 3.2*  CL 109 111 109 109  CO2 19* 19* 20* 17*  GLUCOSE 114* 137* 188* 136*  BUN 26* 35* 38* 47*  CREATININE 1.10* 1.27* 1.44* 2.30*  CALCIUM 8.0* 7.5* 7.6* 8.0*  PHOS 5.5*  --  3.7 4.0   LFT  Recent Labs  06/06/16 0327  PROT 5.2*  ALBUMIN 2.5*  AST 115*  ALT 218*  ALKPHOS 112  BILITOT 2.8*   PT/INR  Recent Labs  06/04/16 1314  LABPROT 19.0*  INR 1.58   Hepatitis Panel No results for input(s): HEPBSAG, HCVAB, HEPAIGM, HEPBIGM in the last 72 hours.  Studies/Results: Dg Chest Port 1 View  Result Date: 06/07/2016 CLINICAL DATA:  70 y/o  F; cholecystitis. EXAM:  PORTABLE CHEST 1 VIEW COMPARISON:  Chest x-ray dated 06/06/2016. FINDINGS: Postsurgical changes related to cervical and thoracolumbar fixation partially visualized. Central venous catheter tip projects over the lower SVC. Endotracheal tube is stable in position. Enteric tube tip projects over mid stomach. Worsening right lung volumes and basilar opacity. Stable left basilar opacity. No pneumothorax. Cardiac silhouette is obscured by basilar opacities. IMPRESSION: Bibasilar opacities increased on the right probably represent atelectasis and effusions. Underlying pneumonia is not excluded. Electronically Signed   By: Kristine Garbe M.D.   On: 06/07/2016 05:33   Dg Chest Port 1 View  Result Date: 06/06/2016 CLINICAL  DATA:  Intubation. EXAM: PORTABLE CHEST 1 VIEW COMPARISON:  08/15/ 2017. FINDINGS: Endotracheal tube and NG tube in stable position. Low lung volumes with persist bibasilar atelectasis and/or infiltrates. No interim change. Small right pleural effusion cannot be excluded. No pneumothorax. Heart size stable. Prior cervical thoracolumbar spine. IMPRESSION: 1. Lines tubes in position. 2. Low lung volumes with bibasilar atelectasis and/or infiltrates, no interim change. Small right pleural effusion cannot be excluded Electronically Signed   By: [name]  Register   On: 06/06/2016 06:55   Dg [place]ce Dys Tube Plc W/fl-no Rad  Result Date: 06/07/2016 CLINICAL DATA:  NASO G TUBE PLACEMENT WITH FLUORO Fluoroscopy was utilized by the requesting physician.  No radiographic interpretation.    Assessment/Plan:  Acute oliguric renal failure in the setting of septic shock - creatinine appears to be increasing and suspect ATN - will continue to monitor urine output and serial creatinine measurements. Will send a urine for analysis  Metabolic acidosis will change IV NS to bicarbonate  Hypokalemia will continue runs of potassium  Acute cholangitis with polymicrobial bacteremia  Continue pip/tazo  Hypervolemia  Was resuscitated with IV fluids 6 L   Hypothyroidism replaced  Antiphospholipid antibody syndrome currently angiomax  Atrial fibrillation - anticoagulation  Asthmatic lung disease    LOS: 3 Ephraim Reichel W @TODAY @10 :58 AM

## 2016-06-07 NOTE — Progress Notes (Signed)
[place] Progress Note Patient Name: Latoya [name] [facility] DOB: November 22, 1945 MRN: [id number]   Date of Service  06/07/2016  HPI/Events of Note  Agitation, breath stacking  eICU Interventions  Continue fentanyl drip Change versed to q2 hrs PRN     Intervention Category Minor Interventions: Agitation / anxiety - evaluation and management  Tineshia Becraft 06/07/2016, 5:21 PM

## 2016-06-07 NOTE — Care Management Note (Signed)
Case Management Note  Patient Details  Name: Latoya [name] MRN: [id number] Date of Birth: 01-29-1946  Subjective/Objective:      Pt admitted with severe sepsis of presumed biliary origin             Action/Plan:  PTA from home with husband.  CM will continue to follow for discharge needs   Expected Discharge Date:                  Expected Discharge Plan:     In-House Referral:     Discharge planning Services  CM Consult  Post Acute Care Choice:    Choice offered to:     DME Arranged:    DME Agency:     HH Arranged:    HH Agency:     Status of Service:  In process, will continue to follow  If discussed at Long Length of Stay Meetings, dates discussed:    Additional Comments: 06/07/2016 Pt remains on ventilator - ongoing agitation issues - propofol added yesterday due to uncontrolled agitation on continuous fentanyl Maryclare Labrador, RN 06/07/2016, 9:25 AM

## 2016-06-07 NOTE — Progress Notes (Signed)
Patient ID: Latoya [name], female   DOB: November 23, 1945, 70 y.o.   MRN: [id number] Social visit for mutual patient. Weaning vent, awake, attending. Husband plans to return at 4pm, will reattempt contact at that time.   Verdis Prime RN BSN  Neurosurgery

## 2016-06-07 NOTE — Progress Notes (Signed)
Patient ID: Latoya [name], female   DOB: [birth year], 70 y.o.   MRN: [id number]          [facility] for Infectious Disease    Date of Admission:  06/04/2016           Day 4 piperacillin tazobactam  She is now afebrile. Repeat blood cultures are negative to date. Gallbladder cultures have grown enterococcus faecium sensitive to ampicillin (and thus piperacillin tazobactam). Antibiotic susceptibilities on Escherichia coli and Klebsiella growing in blood cultures are still pending. I will continue piperacillin tazobactam for now.         Michel Bickers, MD [facility] for Infectious [place] [facility] [phone] pager   [phone] cell 10/25/2015, 1:32 PM

## 2016-06-07 NOTE — Progress Notes (Addendum)
Pharmacy Antibiotic Note  Latoya [name] is a 70 y.o. female admitted on 06/04/2016 with bacteremia/intra-abdominal infection.  Pharmacy has been consulted to narrow antibiotics to Unasyn.  Patient's renal function continues to worsen and urine output also decreasing.  Plan: - Unasyn 3gm IV Q12H - Monitor renal fxn, clinical progress  Height: 5' (152.4 cm) Weight: 180 lb 1.9 oz (81.7 kg) IBW/kg (Calculated) : 45.5  Temp (24hrs), Avg:98.1 F (36.7 C), Min:97.5 F (36.4 C), Max:98.6 F (37 C)   Recent Labs Lab 06/04/16 0045 06/04/16 0103 06/04/16 0332 06/04/16 0513 06/04/16 0520 06/05/16 0443 06/05/16 1948 06/06/16 0327 06/06/16 1050 06/07/16 0415  WBC 15.9*  --   --   --   --  32.0*  --  27.0*  --  30.5*  CREATININE 0.68  --   --   --  0.68 1.10* 1.27* 1.44*  --  2.30*  LATICACIDVEN  --  6.88* 6.06* 6.95*  --   --   --   --   --   --   VANCOTROUGH  --   --   --   --   --   --   --   --  34*  --     Estimated Creatinine Clearance: 21.9 mL/min (by C-G formula based on SCr of 2.3 mg/dL).    Allergies  Allergen Reactions  . Hornet Venom Anaphylaxis  . Pork-Derived Products Anaphylaxis  . Chlorhexidine Itching    Reports that it is the dye in it  . Enoxaparin Sodium     REACTION: thrombocytopenia  . Heparin     REACTION: thrombocytopenia  . Lactose Intolerance (Gi)   . Other     Sensitive to dye in Betadine & Chlorohexadine   . Povidone-Iodine     Sensitivity- but if its wiped off she is able to tolerate betadine   . Indomethacin Rash  . Phenylpropanolamine Rash    Other Reaction: other reaction  . Povidone Iodine Rash    Antimicrobials this admission: 8/14 Zosyn >> 8/17 8/14 Vanc >> 8/16 8/17 Unasyn >>  Dose adjustments this admission: N/A  Microbiology results: 8/16 BCx x2: in process  8/14 Gall bladder abscess: enterococcus (pan-S) 8/14 0045 BCx: E coli (pan-S), Kleb pneumo (R-amp, all else-S), Enterococcus (pan-S) 8/14 0054 BCx: E coli, GPCs -  reincubated  8/14 UCx: insignificant  8/14 MRSA PCR: negative    Leondro Coryell D. Mina Marble, PharmD, BCPS Pager:  [phone] 06/07/2016, 3:12 PM   ==========================   Addendum: - aPTT therapeutic at 57 s (goal 50-85 s) - no bleeding reported   Plan: - Continue bivalirudin at 0.041 mg/kg/hr - F/U AM labs   Zayden Maffei D. Mina Marble, PharmD, BCPS Pager:  [phone] 06/07/2016, 8:27 PM

## 2016-06-07 NOTE — Progress Notes (Signed)
[place] for Bivalirudin  Indication: atrial fibrillation   Allergies  Allergen Reactions  . Hornet Venom Anaphylaxis  . Pork-Derived Products Anaphylaxis  . Chlorhexidine Itching    Reports that it is the dye in it  . Enoxaparin Sodium     REACTION: thrombocytopenia  . Heparin     REACTION: thrombocytopenia  . Lactose Intolerance (Gi)   . Other     Sensitive to dye in Betadine & Chlorohexadine   . Povidone-Iodine     Sensitivity- but if its wiped off she is able to tolerate betadine   . Indomethacin Rash  . Phenylpropanolamine Rash    Other Reaction: other reaction  . Povidone Iodine Rash    Patient Measurements: Height: 5' (152.4 cm) Weight: 180 lb 1.9 oz (81.7 kg) IBW/kg (Calculated) : 45.5  Vital Signs: Temp: 98.1 F (36.7 C) (08/17 0600) Temp Source: Core (Comment) (08/17 0000) BP: 100/53 (08/17 0600) Pulse Rate: 81 (08/17 0600)  Labs:  Recent Labs  06/04/16 1314  06/05/16 0443  06/05/16 1948 06/06/16 0327  06/06/16 1815 06/06/16 2210 06/07/16 0415  HGB  --   < > 13.5  --   --  11.1*  --   --   --  10.8*  HCT  --   --  40.1  --   --  32.7*  --   --   --  31.7*  PLT  --   --  122*  --   --  99*  --   --   --  101*  APTT  --   < > 84*  < >  --  99*  < > 88* 74* 69*  LABPROT 19.0*  --   --   --   --   --   --   --   --   --   INR 1.58  --   --   --   --   --   --   --   --   --   CREATININE  --   < > 1.10*  --  1.27* 1.44*  --   --   --  2.30*  < > = values in this interval not displayed.  Estimated Creatinine Clearance: 21.9 mL/min (by C-G formula based on SCr of 2.3 mg/dL).   Assessment: Patient is a 70 yo female who presented 8/14 with sepsis of presumed biliary origin. She was on rivaroxaban PTA for AFib with last dose 8/13PM. She also has h/o APS w/ h/o DVT/PE. She has documented history of HIT from both UFH and enoxaparin. LFTs and T bili were elevated on admission, therefore not using argatroban.   She was  started on bivalirudin after [place] perc chole was completed 8/14.  Patient's aPTT was therapeutic at 69 seconds on bivalirudin 0.041 mg/kg/hr (3.2  Mg/hr). Hgb 10.8 and pltc 101, low but stable. No bleeding noted per nurse.   Will continue current rate and check an aPTT in 4 hours.    Goal of Therapy:  Goal aPTT: 50-85 seconds Monitor platelets by anticoagulation protocol: Yes   Plan:  -Continue bivalirudin rate to 0.041 mg/kg/hr (3.2 mg/h). -F/u aPTT level in 4 hours, the every 12 hours thereafter if therapeutic. -Monitor CBC, renal function, and s/sx of bleeding.   Demetrius Charity, PharmD Acute Care Pharmacy Resident  Pager: [phone] 06/07/2016

## 2016-06-08 ENCOUNTER — Inpatient Hospital Stay (HOSPITAL_COMMUNITY): Payer: Medicare Other

## 2016-06-08 DIAGNOSIS — J9600 Acute respiratory failure, unspecified whether with hypoxia or hypercapnia: Secondary | ICD-10-CM

## 2016-06-08 LAB — BLOOD GAS, ARTERIAL
ACID-BASE DEFICIT: 1.4 mmol/L (ref 0.0–2.0)
ACID-BASE DEFICIT: 1.3 mmol/L (ref 0.0–2.0)
BICARBONATE: 22.9 meq/L (ref 20.0–24.0)
Bicarbonate: 22.2 mEq/L (ref 20.0–24.0)
DRAWN BY: 252031
Drawn by: 460981
FIO2: 40
FIO2: 100
LHR: 30 {breaths}/min
MECHVT: 400 mL
O2 SAT: 95.9 %
O2 Saturation: 96.5 %
PATIENT TEMPERATURE: 98.6
PCO2 ART: 33.6 mmHg — AB (ref 35.0–45.0)
PEEP/CPAP: 5 cmH2O
PEEP/CPAP: 10 cmH2O
PH ART: 7.436 (ref 7.350–7.450)
PO2 ART: 84.6 mmHg (ref 80.0–100.0)
Patient temperature: 98.6
RATE: 15 resp/min
TCO2: 23.3 mmol/L (ref 0–100)
TCO2: 24.1 mmol/L (ref 0–100)
VT: 500 mL
pCO2 arterial: 38.3 mmHg (ref 35.0–45.0)
pH, Arterial: 7.393 (ref 7.350–7.450)
pO2, Arterial: 84.6 mmHg (ref 80.0–100.0)

## 2016-06-08 LAB — RENAL FUNCTION PANEL
ANION GAP: 12 (ref 5–15)
Albumin: 2.4 g/dL — ABNORMAL LOW (ref 3.5–5.0)
Albumin: 2.4 g/dL — ABNORMAL LOW (ref 3.5–5.0)
Anion gap: 12 (ref 5–15)
BUN: 64 mg/dL — ABNORMAL HIGH (ref 6–20)
BUN: 68 mg/dL — AB (ref 6–20)
CALCIUM: 7.7 mg/dL — AB (ref 8.9–10.3)
CALCIUM: 7.8 mg/dL — AB (ref 8.9–10.3)
CHLORIDE: 105 mmol/L (ref 101–111)
CO2: 25 mmol/L (ref 22–32)
CO2: 25 mmol/L (ref 22–32)
CREATININE: 2.99 mg/dL — AB (ref 0.44–1.00)
CREATININE: 3.08 mg/dL — AB (ref 0.44–1.00)
Chloride: 103 mmol/L (ref 101–111)
GFR calc Af Amer: 17 mL/min — ABNORMAL LOW (ref >60)
GFR calc non Af Amer: 14 mL/min — ABNORMAL LOW (ref >60)
GFR, EST AFRICAN AMERICAN: 17 mL/min — AB (ref >60)
GFR, EST NON AFRICAN AMERICAN: 15 mL/min — AB (ref >60)
GLUCOSE: 110 mg/dL — AB (ref 65–99)
Glucose, Bld: 148 mg/dL — ABNORMAL HIGH (ref 65–99)
PHOSPHORUS: 3.1 mg/dL (ref 2.5–4.6)
Phosphorus: 2.8 mg/dL (ref 2.5–4.6)
Potassium: 3 mmol/L — ABNORMAL LOW (ref 3.5–5.1)
Potassium: 3.3 mmol/L — ABNORMAL LOW (ref 3.5–5.1)
SODIUM: 140 mmol/L (ref 135–145)
SODIUM: 142 mmol/L (ref 135–145)

## 2016-06-08 LAB — CBC
HEMATOCRIT: 29.1 % — AB (ref 36.0–46.0)
HEMOGLOBIN: 9.8 g/dL — AB (ref 12.0–15.0)
MCH: 27.6 pg (ref 26.0–34.0)
MCHC: 33.7 g/dL (ref 30.0–36.0)
MCV: 82 fL (ref 78.0–100.0)
Platelets: 89 10*3/uL — ABNORMAL LOW (ref 150–400)
RBC: 3.55 MIL/uL — ABNORMAL LOW (ref 3.87–5.11)
RDW: 14.5 % (ref 11.5–15.5)
WBC: 22.8 10*3/uL — ABNORMAL HIGH (ref 4.0–10.5)

## 2016-06-08 LAB — PHOSPHORUS: Phosphorus: 2.4 mg/dL — ABNORMAL LOW (ref 2.5–4.6)

## 2016-06-08 LAB — MAGNESIUM: Magnesium: 2.9 mg/dL — ABNORMAL HIGH (ref 1.7–2.4)

## 2016-06-08 LAB — GLUCOSE, CAPILLARY
GLUCOSE-CAPILLARY: 175 mg/dL — AB (ref 65–99)
Glucose-Capillary: 168 mg/dL — ABNORMAL HIGH (ref 65–99)
Glucose-Capillary: 186 mg/dL — ABNORMAL HIGH (ref 65–99)
Glucose-Capillary: 154 mg/dL — ABNORMAL HIGH (ref 65–99)
Glucose-Capillary: 116 mg/dL — ABNORMAL HIGH (ref 65–99)

## 2016-06-08 LAB — APTT
aPTT: 56 seconds — ABNORMAL HIGH (ref 24–36)
aPTT: 50 seconds — ABNORMAL HIGH (ref 24–36)

## 2016-06-08 LAB — BASIC METABOLIC PANEL
Anion gap: 14 (ref 5–15)
BUN: 59 mg/dL — AB (ref 6–20)
CHLORIDE: 104 mmol/L (ref 101–111)
CO2: 24 mmol/L (ref 22–32)
CREATININE: 2.78 mg/dL — AB (ref 0.44–1.00)
Calcium: 7.9 mg/dL — ABNORMAL LOW (ref 8.9–10.3)
GFR calc Af Amer: 19 mL/min — ABNORMAL LOW (ref >60)
GFR calc non Af Amer: 16 mL/min — ABNORMAL LOW (ref >60)
Glucose, Bld: 174 mg/dL — ABNORMAL HIGH (ref 65–99)
Potassium: 3.1 mmol/L — ABNORMAL LOW (ref 3.5–5.1)
SODIUM: 142 mmol/L (ref 135–145)

## 2016-06-08 MED ORDER — METOPROLOL TARTRATE 5 MG/5ML IV SOLN: 2.5000 mg | INTRAVENOUS | Status: DC | PRN

## 2016-06-08 MED ORDER — ALBUTEROL SULFATE (2.5 MG/3ML) 0.083% IN NEBU: 2.5000 mg | INHALATION_SOLUTION | RESPIRATORY_TRACT | Status: DC | PRN

## 2016-06-08 MED ORDER — SODIUM CHLORIDE 0.9 % IV SOLN
0.0500 mg/kg/h | INTRAVENOUS | Status: DC
  Administered 2016-06-09: 0.045 mg/kg/h via INTRAVENOUS
  Administered 2016-06-11 – 2016-06-19 (×8): 0.05 mg/kg/h via INTRAVENOUS
  Filled 2016-06-08 (×10): qty 250

## 2016-06-08 MED ORDER — ETOMIDATE 2 MG/ML IV SOLN
10.0000 mg | Freq: Once | INTRAVENOUS | Status: AC
  Administered 2016-06-08: 10 mg via INTRAVENOUS

## 2016-06-08 MED ORDER — ARFORMOTEROL TARTRATE 15 MCG/2ML IN NEBU
15.0000 ug | INHALATION_SOLUTION | Freq: Two times a day (BID) | RESPIRATORY_TRACT | Status: DC
  Administered 2016-06-09 – 2016-06-21 (×26): 15 ug via RESPIRATORY_TRACT
  Filled 2016-06-08 (×29): qty 2

## 2016-06-08 MED ORDER — POTASSIUM CHLORIDE 10 MEQ/50ML IV SOLN
10.0000 meq | INTRAVENOUS | Status: AC
Start: 2016-06-08 — End: 2016-06-08
  Administered 2016-06-08 (×4): 10 meq via INTRAVENOUS
  Filled 2016-06-08 (×4): qty 50

## 2016-06-08 MED ORDER — MIDAZOLAM HCL 2 MG/2ML IJ SOLN
4.0000 mg | Freq: Once | INTRAMUSCULAR | Status: AC
  Administered 2016-06-08: 4 mg via INTRAVENOUS

## 2016-06-08 MED ORDER — BUDESONIDE 0.5 MG/2ML IN SUSP
0.5000 mg | Freq: Two times a day (BID) | RESPIRATORY_TRACT | Status: DC
  Administered 2016-06-09 – 2016-06-21 (×25): 0.5 mg via RESPIRATORY_TRACT
  Filled 2016-06-08 (×26): qty 2

## 2016-06-08 MED ORDER — FUROSEMIDE 10 MG/ML IJ SOLN
80.0000 mg | Freq: Two times a day (BID) | INTRAMUSCULAR | Status: DC
  Administered 2016-06-08 – 2016-06-10 (×6): 80 mg via INTRAVENOUS
  Filled 2016-06-08 (×8): qty 8

## 2016-06-08 MED ORDER — FENTANYL CITRATE (PF) 100 MCG/2ML IJ SOLN
INTRAMUSCULAR | Status: AC
  Filled 2016-06-08: qty 4

## 2016-06-08 MED ORDER — LEVOTHYROXINE SODIUM 75 MCG PO TABS
75.0000 ug | ORAL_TABLET | Freq: Every day | ORAL | Status: DC
  Administered 2016-06-09 – 2016-06-13 (×5): 75 ug
  Filled 2016-06-08 (×6): qty 1

## 2016-06-08 MED ORDER — METOPROLOL TARTRATE 5 MG/5ML IV SOLN
5.0000 mg | INTRAVENOUS | Status: DC | PRN
  Administered 2016-06-09 – 2016-06-13 (×5): 5 mg via INTRAVENOUS
  Filled 2016-06-08 (×7): qty 5

## 2016-06-08 MED ORDER — FUROSEMIDE 10 MG/ML IJ SOLN
60.0000 mg | Freq: Once | INTRAMUSCULAR | Status: AC
  Administered 2016-06-08: 60 mg via INTRAVENOUS
  Filled 2016-06-08: qty 6

## 2016-06-08 MED ORDER — PANTOPRAZOLE SODIUM 40 MG PO PACK
40.0000 mg | PACK | Freq: Two times a day (BID) | ORAL | Status: DC
  Administered 2016-06-08 – 2016-06-13 (×10): 40 mg
  Filled 2016-06-08 (×14): qty 20

## 2016-06-08 MED ORDER — DEXMEDETOMIDINE HCL IN NACL 200 MCG/50ML IV SOLN
0.4000 ug/kg/h | INTRAVENOUS | Status: DC
  Administered 2016-06-08: 0.5 ug/kg/h via INTRAVENOUS
  Administered 2016-06-08: 0.4 ug/kg/h via INTRAVENOUS
  Filled 2016-06-08 (×2): qty 50

## 2016-06-08 MED ORDER — LEVALBUTEROL HCL 0.63 MG/3ML IN NEBU: 0.6300 mg | INHALATION_SOLUTION | Freq: Four times a day (QID) | RESPIRATORY_TRACT | Status: DC | PRN

## 2016-06-08 MED ORDER — POTASSIUM PHOSPHATES 15 MMOLE/5ML IV SOLN
10.0000 mmol | Freq: Once | INTRAVENOUS | Status: AC
  Administered 2016-06-08: 10 mmol via INTRAVENOUS
  Filled 2016-06-08: qty 3.33

## 2016-06-08 MED ORDER — SODIUM CHLORIDE 0.9 % IV SOLN
INTRAVENOUS | Status: DC
  Administered 2016-06-12: 10 mL/h via INTRAVENOUS

## 2016-06-08 MED ORDER — MIDAZOLAM HCL 2 MG/2ML IJ SOLN
INTRAMUSCULAR | Status: AC
  Filled 2016-06-08: qty 4

## 2016-06-08 MED ORDER — FENTANYL CITRATE (PF) 100 MCG/2ML IJ SOLN
100.0000 ug | Freq: Once | INTRAMUSCULAR | Status: AC
  Administered 2016-06-08: 100 ug via INTRAVENOUS

## 2016-06-08 NOTE — Progress Notes (Signed)
[facility] ROUNDING NOTE   Subjective:   Interval History:  No changes this am appears to be comfortable   Objective:  Vital signs in last 24 hours:  Temp:  [97.7 F (36.5 C)-99.7 F (37.6 C)] 99.7 F (37.6 C) (08/18 0900) Pulse Rate:  [72-135] 110 (08/18 0900) Resp:  [11-30] 30 (08/18 0900) BP: (89-167)/(48-93) 127/76 (08/18 0900) SpO2:  [95 %-100 %] 100 % (08/18 1000) FiO2 (%):  [40 %] 40 % (08/18 1000) Weight:  [84 kg (185 lb 3 oz)] 84 kg (185 lb 3 oz) (08/18 0403)  Weight change: 2.3 kg (5 lb 1.1 oz) Filed Weights   06/06/16 0215 06/07/16 0356 06/08/16 0403  Weight: 79.1 kg (174 lb 6.1 oz) 81.7 kg (180 lb 1.9 oz) 84 kg (185 lb 3 oz)    Intake/Output: I/O last 3 completed shifts: In: 6214.2 [I.V.:4929.7; Other:140; NG/GT:919.5; IV Piggyback:225] Out: [id number] [Urine:448; Emesis/NG output:900; Drains:395]   Intake/Output this shift:  Total I/O In: 461.2 [I.V.:316.2; Other:10; NG/GT:135] Out: 22 [Urine:10; Emesis/NG output:50; Drains:10]  CVS- RRR RS- CTA   Currently ventilator dependent. ABD- BS  Hypoactive bowel sounds  EXT-  Trace 1 +  edema   Basic Metabolic Panel:  Recent Labs Lab 06/05/16 0443 06/05/16 1948 06/06/16 0327 06/07/16 0415 06/08/16 0432  NA 143 140 140 140 142  K 3.0* 3.4* 3.1* 3.2* 3.1*  CL 109 111 109 109 104  CO2 19* 19* 20* 17* 24  GLUCOSE 114* 137* 188* 136* 174*  BUN 26* 35* 38* 47* 59*  CREATININE 1.10* 1.27* 1.44* 2.30* 2.78*  CALCIUM 8.0* 7.5* 7.6* 8.0* 7.9*  MG 1.3* 3.3* 3.3* 3.3* 2.9*  PHOS 5.5*  --  3.7 4.0 2.4*    Liver Function Tests:  Recent Labs Lab 06/04/16 0045 06/04/16 0520 06/06/16 0327  AST 500* 752* 115*  ALT 291* 486* 218*  ALKPHOS 138* 129* 112  BILITOT 2.7* 2.5* 2.8*  PROT 7.6 5.7* 5.2*  ALBUMIN 4.5 3.4* 2.5*    Recent Labs Lab 06/04/16 0045  LIPASE 26   No results for input(s): AMMONIA in the last 168 hours.  CBC:  Recent Labs Lab 06/04/16 0045 06/05/16 0443  06/06/16 0327 06/07/16 0415 06/08/16 0432  WBC 15.9* 32.0* 27.0* 30.5* 22.8*  HGB 16.0* 13.5 11.1* 10.8* 9.8*  HCT 47.9* 40.1 32.7* 31.7* 29.1*  MCV 87.6 84.4 83.2 83.0 82.0  PLT 283 122* 99* 101* 89*    Cardiac Enzymes:  Recent Labs Lab 06/04/16 0045  TROPONINI <0.03    BNP: Invalid input(s): POCBNP  CBG:  Recent Labs Lab 06/07/16 1546 06/07/16 2016 06/07/16 2359 06/08/16 0353 06/08/16 0806  GLUCAP 152* 190* 184* 168* 186*    Microbiology: Results for orders placed or performed during the hospital encounter of 06/04/16  Blood culture (routine x 2)     Status: Abnormal   Collection Time: 06/04/16 12:45 AM  Result Value Ref Range Status   Specimen Description BLOOD LEFT ANTECUBITAL  Final   Special Requests BOTTLES DRAWN AEROBIC AND ANAEROBIC 5CC EA  Final   Culture  Setup Time   Final    GRAM NEGATIVE RODS GRAM POSITIVE COCCI CRITICAL RESULT CALLED TO, READ BACK BY AND VERIFIED WITH: Salome Holmes, PHARMD AT [id number] 06/04/16 BY D. VANHOOK IN BOTH AEROBIC AND ANAEROBIC BOTTLES    Culture (A)  Final    ESCHERICHIA COLI KLEBSIELLA PNEUMONIAE ENTEROCOCCUS FAECIUM    Report Status 06/07/2016 FINAL  Final   Organism ID, Bacteria ESCHERICHIA COLI  Final  Organism ID, Bacteria KLEBSIELLA PNEUMONIAE  Final   Organism ID, Bacteria ENTEROCOCCUS FAECIUM  Final      Susceptibility   Escherichia coli - MIC*    AMPICILLIN 8 SENSITIVE Sensitive     CEFAZOLIN <=4 SENSITIVE Sensitive     CEFEPIME <=1 SENSITIVE Sensitive     CEFTAZIDIME <=1 SENSITIVE Sensitive     CEFTRIAXONE <=1 SENSITIVE Sensitive     CIPROFLOXACIN <=0.25 SENSITIVE Sensitive     GENTAMICIN 2 SENSITIVE Sensitive     IMIPENEM <=0.25 SENSITIVE Sensitive     TRIMETH/SULFA <=20 SENSITIVE Sensitive     AMPICILLIN/SULBACTAM <=2 SENSITIVE Sensitive     PIP/TAZO <=4 SENSITIVE Sensitive     Extended ESBL NEGATIVE Sensitive     * ESCHERICHIA COLI   Enterococcus faecium - MIC*    AMPICILLIN <=2 SENSITIVE  Sensitive     VANCOMYCIN <=0.5 SENSITIVE Sensitive     GENTAMICIN SYNERGY SENSITIVE Sensitive     * ENTEROCOCCUS FAECIUM   Klebsiella pneumoniae - MIC*    AMPICILLIN >=32 RESISTANT Resistant     CEFAZOLIN <=4 SENSITIVE Sensitive     CEFEPIME <=1 SENSITIVE Sensitive     CEFTAZIDIME <=1 SENSITIVE Sensitive     CEFTRIAXONE <=1 SENSITIVE Sensitive     CIPROFLOXACIN <=0.25 SENSITIVE Sensitive     GENTAMICIN <=1 SENSITIVE Sensitive     IMIPENEM <=0.25 SENSITIVE Sensitive     TRIMETH/SULFA <=20 SENSITIVE Sensitive     AMPICILLIN/SULBACTAM 4 SENSITIVE Sensitive     PIP/TAZO <=4 SENSITIVE Sensitive     Extended ESBL NEGATIVE Sensitive     * KLEBSIELLA PNEUMONIAE  Blood Culture ID Panel (Reflexed)     Status: Abnormal   Collection Time: 06/04/16 12:45 AM  Result Value Ref Range Status   Enterococcus species NOT DETECTED NOT DETECTED Final   Vancomycin resistance NOT DETECTED NOT DETECTED Final   Listeria monocytogenes NOT DETECTED NOT DETECTED Final   Staphylococcus species NOT DETECTED NOT DETECTED Final   Staphylococcus aureus NOT DETECTED NOT DETECTED Final   Methicillin resistance NOT DETECTED NOT DETECTED Final   Streptococcus species NOT DETECTED NOT DETECTED Final   Streptococcus agalactiae NOT DETECTED NOT DETECTED Final   Streptococcus pneumoniae NOT DETECTED NOT DETECTED Final   Streptococcus pyogenes NOT DETECTED NOT DETECTED Final   Acinetobacter baumannii NOT DETECTED NOT DETECTED Final   Enterobacteriaceae species DETECTED (A) NOT DETECTED Final    Comment: CRITICAL RESULT CALLED TO, READ BACK BY AND VERIFIED WITH: Salome Holmes, PHARMD AT 1415 06/04/16 BY D. VANHOOK    Enterobacter cloacae complex NOT DETECTED NOT DETECTED Final   Escherichia coli DETECTED (A) NOT DETECTED Final    Comment: CRITICAL RESULT CALLED TO, READ BACK BY AND VERIFIED WITH: Salome Holmes, PHARMD AT 1415 06/04/16 BY D. VANHOOK    Klebsiella oxytoca NOT DETECTED NOT DETECTED Final   Klebsiella pneumoniae  NOT DETECTED NOT DETECTED Final   Proteus species NOT DETECTED NOT DETECTED Final   Serratia marcescens NOT DETECTED NOT DETECTED Final   Carbapenem resistance NOT DETECTED NOT DETECTED Final   Haemophilus influenzae NOT DETECTED NOT DETECTED Final   Neisseria meningitidis NOT DETECTED NOT DETECTED Final   Pseudomonas aeruginosa NOT DETECTED NOT DETECTED Final   Candida albicans NOT DETECTED NOT DETECTED Final   Candida glabrata NOT DETECTED NOT DETECTED Final   Candida krusei NOT DETECTED NOT DETECTED Final   Candida parapsilosis NOT DETECTED NOT DETECTED Final   Candida tropicalis NOT DETECTED NOT DETECTED Final  Blood culture (routine x 2)  Status: Abnormal   Collection Time: 06/04/16 12:54 AM  Result Value Ref Range Status   Specimen Description BLOOD LEFT HAND  Final   Special Requests IN PEDIATRIC BOTTLE 4CC  Final   Culture  Setup Time   Final    GRAM NEGATIVE RODS GRAM POSITIVE COCCI CRITICAL RESULT CALLED TO, READ BACK BY AND VERIFIED WITH: Salome Holmes, PHARMD AT [id number] 06/04/16 BY D. VANHOOK AEROBIC BOTTLE ONLY    Culture (A)  Final    ESCHERICHIA COLI ENTEROCOCCUS FAECIUM SUSCEPTIBILITIES PERFORMED ON PREVIOUS CULTURE WITHIN THE LAST 5 DAYS.    Report Status 06/07/2016 FINAL  Final  Urine culture     Status: Abnormal   Collection Time: 06/04/16  1:33 AM  Result Value Ref Range Status   Specimen Description URINE, RANDOM  Final   Special Requests NONE  Final   Culture <10,000 COLONIES/mL INSIGNIFICANT GROWTH (A)  Final   Report Status 06/05/2016 FINAL  Final  MRSA PCR Screening     Status: None   Collection Time: 06/04/16  7:07 AM  Result Value Ref Range Status   MRSA by PCR NEGATIVE NEGATIVE Final    Comment:        The GeneXpert MRSA Assay (FDA approved for NASAL specimens only), is one component of a comprehensive MRSA colonization surveillance program. It is not intended to diagnose MRSA infection nor to guide or monitor treatment for MRSA infections.    Aerobic/Anaerobic Culture (surgical/deep wound)     Status: None (Preliminary result)   Collection Time: 06/04/16  4:05 PM  Result Value Ref Range Status   Specimen Description ABSCESS GALL BLADDER  Final   Special Requests Normal  Final   Gram Stain   Final    RARE WBC PRESENT, PREDOMINANTLY PMN FEW GRAM NEGATIVE RODS FEW GRAM POSITIVE COCCI IN PAIRS IN CHAINS    Culture   Final    MODERATE ENTEROCOCCUS FAECIUM FEW ESCHERICHIA COLI NO ANAEROBES ISOLATED; CULTURE IN PROGRESS FOR 5 DAYS    Report Status PENDING  Incomplete   Organism ID, Bacteria ENTEROCOCCUS FAECIUM  Final   Organism ID, Bacteria ESCHERICHIA COLI  Final      Susceptibility   Escherichia coli - MIC*    AMPICILLIN 8 SENSITIVE Sensitive     CEFAZOLIN <=4 SENSITIVE Sensitive     CEFEPIME <=1 SENSITIVE Sensitive     CEFTAZIDIME <=1 SENSITIVE Sensitive     CEFTRIAXONE <=1 SENSITIVE Sensitive     CIPROFLOXACIN <=0.25 SENSITIVE Sensitive     GENTAMICIN <=1 SENSITIVE Sensitive     IMIPENEM <=0.25 SENSITIVE Sensitive     TRIMETH/SULFA <=20 SENSITIVE Sensitive     AMPICILLIN/SULBACTAM <=2 SENSITIVE Sensitive     PIP/TAZO <=4 SENSITIVE Sensitive     Extended ESBL NEGATIVE Sensitive     * FEW ESCHERICHIA COLI   Enterococcus faecium - MIC*    AMPICILLIN <=2 SENSITIVE Sensitive     VANCOMYCIN <=0.5 SENSITIVE Sensitive     GENTAMICIN SYNERGY SENSITIVE Sensitive     * MODERATE ENTEROCOCCUS FAECIUM  Culture, blood (routine x 2)     Status: None (Preliminary result)   Collection Time: 06/06/16  3:59 PM  Result Value Ref Range Status   Specimen Description BLOOD RIGHT HAND  Final   Special Requests IN PEDIATRIC BOTTLE 2CC  Final   Culture NO GROWTH < 24 HOURS  Final   Report Status PENDING  Incomplete  Culture, blood (routine x 2)     Status: None (Preliminary result)   Collection Time:  06/06/16  4:06 PM  Result Value Ref Range Status   Specimen Description BLOOD LEFT HAND  Final   Special Requests IN PEDIATRIC BOTTLE  3CC  Final   Culture NO GROWTH < 24 HOURS  Final   Report Status PENDING  Incomplete    Coagulation Studies: No results for input(s): LABPROT, INR in the last 72 hours.  Urinalysis:  Recent Labs  06/07/16 1223  COLORURINE YELLOW  LABSPEC >1.030*  PHURINE 5.5  GLUCOSEU NEGATIVE  HGBUR LARGE*  BILIRUBINUR NEGATIVE  KETONESUR NEGATIVE  PROTEINUR 30*  NITRITE NEGATIVE  LEUKOCYTESUR TRACE*      Imaging: Dg Abd 1 View  Result Date: 06/07/2016 CLINICAL DATA:  Feeding tube placement. EXAM: ABDOMEN - 1 VIEW; DG NASO G TUBE PLC W/FL-NO RAD COMPARISON:  06/04/2016. FINDINGS: The feeding tube was placed under fluoroscopic guidance by [employer], RT. A single spot image of the mid abdomen was obtained after injection of barium through the tube. This demonstrates the tube extending through the stomach and into the proximal small bowel with its tip obscured by overlying spinal hardware. The injected contrast is filling the proximal jejunum. IMPRESSION: Feeding tube tip in the proximal jejunum, just beyond the ligament of Treitz, obscured by overlying hardware. Electronically Signed   By: Claudie Revering M.D.   On: 06/07/2016 10:21   Dg Chest Port 1 View  Result Date: 06/08/2016 CLINICAL DATA:  Respiratory failure.  Assess endotracheal tube. EXAM: PORTABLE CHEST 1 VIEW COMPARISON:  06/07/2016 FINDINGS: Endotracheal tube is 2.3 cm above the carina. Nasogastric tube and feeding tube extend into the abdomen. Right jugular central line tip is in the SVC region. There continues to be low lung volumes with bibasilar chest densities. Right basilar chest densities probably represent a combination of pleural fluid and consolidation. Difficult to exclude mild pulmonary edema. Surgical hardware in the cervical spine and lumbar spine. Negative for a pneumothorax. IMPRESSION: Low lung volumes with persistent bibasilar opacities, right side greater than left. Increased densities in both lungs may represent some  pulmonary edema. Support apparatuses as described. Electronically Signed   By: Markus Daft M.D.   On: 06/08/2016 06:58   Dg Chest Port 1 View  Result Date: 06/07/2016 CLINICAL DATA:  70 y/o  F; cholecystitis. EXAM: PORTABLE CHEST 1 VIEW COMPARISON:  Chest x-ray dated 06/06/2016. FINDINGS: Postsurgical changes related to cervical and thoracolumbar fixation partially visualized. Central venous catheter tip projects over the lower SVC. Endotracheal tube is stable in position. Enteric tube tip projects over mid stomach. Worsening right lung volumes and basilar opacity. Stable left basilar opacity. No pneumothorax. Cardiac silhouette is obscured by basilar opacities. IMPRESSION: Bibasilar opacities increased on the right probably represent atelectasis and effusions. Underlying pneumonia is not excluded. Electronically Signed   By: Kristine Garbe M.D.   On: 06/07/2016 05:33   Dg Addison Bailey G Tube Plc W/fl-no Rad  Result Date: 06/07/2016 CLINICAL DATA:  NASO G TUBE PLACEMENT WITH FLUORO Fluoroscopy was utilized by the requesting physician.  No radiographic interpretation.     Medications:   . sodium chloride    . amiodarone 30 mg/hr (06/08/16 1000)  . bivalirudin (ANGIOMAX) infusion 0.5 mg/mL (Non-ACS indications) 0.041 mg/kg/hr (06/08/16 1000)  . fentaNYL infusion INTRAVENOUS 300 mcg/hr (06/08/16 1000)   . ampicillin-sulbactam (UNASYN) IV  3 g Intravenous Q12H  . antiseptic oral rinse  7 mL Mouth Rinse Q4H  . budesonide (PULMICORT) nebulizer solution  0.25 mg Nebulization BID  . feeding supplement (VITAL HIGH PROTEIN)  1,000 mL  Per Tube Q24H  . furosemide  80 mg Intravenous Q12H  . insulin aspart  0-9 Units Subcutaneous Q4H  . ipratropium-albuterol  3 mL Nebulization Q6H  . [START ON 06/09/2016] levothyroxine  75 mcg Per Tube Daily  . pantoprazole sodium  40 mg Per Tube BID  . potassium phosphate IVPB (mmol)  10 mmol Intravenous Once   sodium chloride, lidocaine, midazolam  Assessment/  Plan:   Acute oliguric renal failure in the setting of septic shock - creatinine appears to be increasing and suspect ATN - will continue to monitor urine output and serial creatinine measurements.   Metabolic acidosis corrected   Hypokalemia will continue runs of potassium  Hypophosphatemia repleted  Acute cholangitis with polymicrobial bacteremia  Continue pip/tazo  Hypervolemia   will stop IV fluids and continue diuretics IV 80mg  q 8h  Hypothyroidism replaced  Antiphospholipid antibody syndrome currently angiomax  Atrial fibrillation - anticoagulation  Asthmatic lung disease     LOS: 4 Alisandra Son W @TODAY @10 :32 AM

## 2016-06-08 NOTE — Progress Notes (Addendum)
Pt self extubated around 1600. Continuous sedation stopped. Pt placed on 4 liters [place]. Sats 95-97%. Pt able to speak her name. [facility] MD and RT called and made aware. Pts husband also called and made aware.  Will continue to monitor.

## 2016-06-08 NOTE — Procedures (Signed)
Intubation Procedure Note Latoya [name] Latoya [name] IB:[id number] [birth year]  Procedure: Intubation Indications: Respiratory insufficiency  Procedure Details Consent: Unable to obtain consent because of altered level of consciousness. Time Out: Verified patient identification, verified procedure, site/side was marked, verified correct patient position, special equipment/implants available, medications/allergies/relevent history reviewed, required imaging and test results available.  Performed  Maximum sterile technique was used including gloves and hand hygiene.  MAC and 3  Given 4 mg versed, 100 mcg fentanyl, 20 mg etomidate.  Inserted 7.5 ETT to 24 cm at lip.  Confirmed with ausculation and CO2 detector.  Evaluation Hemodynamic Status: BP stable throughout; O2 sats: transiently fell during during procedure Patient's Current Condition: stable Complications: No apparent complications Patient did tolerate procedure well. Chest X-ray ordered to verify placement.  CXR: pending.   Latoya Mires, MD [name] 06/08/2016, 10:13 PM Pager:  [phone] After 3pm call: [phone]

## 2016-06-08 NOTE — Progress Notes (Signed)
[place] for Bivalirudin  Indication: atrial fibrillation   Allergies  Allergen Reactions  . Hornet Venom Anaphylaxis  . Pork-Derived Products Anaphylaxis  . Chlorhexidine Itching    Reports that it is the dye in it  . Enoxaparin Sodium     REACTION: thrombocytopenia  . Heparin     REACTION: thrombocytopenia  . Lactose Intolerance (Gi)   . Other     Sensitive to dye in Betadine & Chlorohexadine   . Povidone-Iodine     Sensitivity- but if its wiped off she is able to tolerate betadine   . Indomethacin Rash  . Phenylpropanolamine Rash    Other Reaction: other reaction  . Povidone Iodine Rash    Patient Measurements: Height: 5' (152.4 cm) Weight: 185 lb 3 oz (84 kg) IBW/kg (Calculated) : 45.5  Vital Signs: Temp: 99.5 F (37.5 C) (08/18 1600) BP: 155/93 (08/18 1600) Pulse Rate: 105 (08/18 1600)  Labs:  Recent Labs  06/06/16 0327  06/07/16 0415  06/07/16 1945 06/08/16 0432 06/08/16 1415 06/08/16 1714  HGB 11.1*  --  10.8*  --   --  9.8*  --   --   HCT 32.7*  --  31.7*  --   --  29.1*  --   --   PLT 99*  --  101*  --   --  89*  --   --   APTT 99*  < > 69*  < > 57* 56*  --  50*  CREATININE 1.44*  --  2.30*  --   --  2.78* 2.99*  --   < > = values in this interval not displayed.  Estimated Creatinine Clearance: 17.1 mL/min (by C-G formula based on SCr of 2.99 mg/dL).   Assessment: Patient is a 70 yo female who presented 8/14 with sepsis of presumed biliary origin. She was on rivaroxaban PTA for AFib with last dose 8/13PM. She also has h/o APS w/ h/o DVT/PE. She has documented history of HIT from both UFH and enoxaparin. LFTs and T bili were elevated on admission, therefore not using argatroban.   She was started on bivalirudin after [place] perc chole was completed 8/14.  PM PTT = 50 seconds, has been trending down  Goal of Therapy:  Goal aPTT: 50-85 seconds Monitor platelets by anticoagulation protocol: Yes   Plan:   -Increase bivalirudin to 0.045 mg / kg / hr = 3.78 mg / hr -Monitor aPTT every 12 hours.  -Monitor CBC, renal function, and s/sx of bleeding.  -Watch Plts closely - consider if therapy should continue  Thank you Anette Guarneri, PharmD [phone]  06/08/2016

## 2016-06-08 NOTE — Progress Notes (Signed)
[place] for Bivalirudin  Indication: atrial fibrillation   Allergies  Allergen Reactions  . Hornet Venom Anaphylaxis  . Pork-Derived Products Anaphylaxis  . Chlorhexidine Itching    Reports that it is the dye in it  . Enoxaparin Sodium     REACTION: thrombocytopenia  . Heparin     REACTION: thrombocytopenia  . Lactose Intolerance (Gi)   . Other     Sensitive to dye in Betadine & Chlorohexadine   . Povidone-Iodine     Sensitivity- but if its wiped off she is able to tolerate betadine   . Indomethacin Rash  . Phenylpropanolamine Rash    Other Reaction: other reaction  . Povidone Iodine Rash    Patient Measurements: Height: 5' (152.4 cm) Weight: 185 lb 3 oz (84 kg) IBW/kg (Calculated) : 45.5  Vital Signs: Temp: 99.5 F (37.5 C) (08/18 0600) Temp Source: Core (Comment) (08/18 0400) BP: 128/74 (08/18 0600) Pulse Rate: 120 (08/18 0600)  Labs:  Recent Labs  06/06/16 0327  06/07/16 0415 06/07/16 0745 06/07/16 1945 06/08/16 0432  HGB 11.1*  --  10.8*  --   --  9.8*  HCT 32.7*  --  31.7*  --   --  29.1*  PLT 99*  --  101*  --   --  89*  APTT 99*  < > 69* 65* 57* 56*  CREATININE 1.44*  --  2.30*  --   --  2.78*  < > = values in this interval not displayed.  Estimated Creatinine Clearance: 18.4 mL/min (by C-G formula based on SCr of 2.78 mg/dL).   Assessment: Patient is a 70 yo female who presented 8/14 with sepsis of presumed biliary origin. She was on rivaroxaban PTA for AFib with last dose 8/13PM. She also has h/o APS w/ h/o DVT/PE. She has documented history of HIT from both UFH and enoxaparin. LFTs and T bili were elevated on admission, therefore not using argatroban.   She was started on bivalirudin after [place] perc chole was completed 8/14.  Patient's second 4-hour aPTT was therapeutic at 56 seconds on bivalirudin 0.041 mg/kg/hr (3.2  Mg/hr). Hgb 9.8 (16 on admit) and pltc 89 (283 on admit). No bleeding noted per nurse.    Will continue current rate and check aPTT in 12 hours  Goal of Therapy:  Goal aPTT: 50-85 seconds Monitor platelets by anticoagulation protocol: Yes   Plan:  -Continue bivalirudin rate to 0.041 mg/kg/hr (3.2 mg/h). -Monitor aPTT every 12 hours.  -Monitor CBC, renal function, and s/sx of bleeding.  -Watch Plts closely - consider if therapy should continue  Sloan Leiter, PharmD, BCPS Clinical Pharmacist [phone] 06/08/2016

## 2016-06-08 NOTE — Progress Notes (Signed)
PCCM Acute Assessment  Subjective: Tachycardia, hypoxia, increased WOB, altered mental status.  Vital signs: BP (!) 188/123   Pulse (!) 157   Temp 99.3 F (37.4 C)   Resp (!) 37   Ht 5' (1.524 m)   Wt 185 lb 3 oz (84 kg)   SpO2 100%   BMI 36.17 kg/m   Intake/output: I/O last 3 completed shifts: In: 6719.7 [I.V.:4741.2; Other:90; NG/GT:1324.5; IV Piggyback:564] Out: 2453 [Urine:1188; Emesis/NG output:850; Drains:415]  General: somnolent Neuro: moves extremities Cardiac: irregular, tachycardic Chest: b/l crackles Abd: mild tenderness Ext: 1+ edema Skin: no rashes  Dg Chest Port 1 View  Result Date: 06/08/2016 CLINICAL DATA:  Acute onset of respiratory failure. Endotracheal tube placement. Initial encounter. EXAM: PORTABLE CHEST 1 VIEW COMPARISON:  Chest radiograph performed earlier today at 4:23 a.m. FINDINGS: The patient's endotracheal tube is seen ending 3 cm above the carina. An enteric tube is noted extending below the diaphragm. The right IJ line is noted ending about the mid to distal SVC. Vascular congestion is noted. Bilateral airspace opacification raises concern for pulmonary edema. A small right pleural effusion is noted. No pneumothorax is identified. The cardiomediastinal silhouette is borderline enlarged. No acute osseous abnormalities are identified. Thoracolumbar spinal fusion hardware is partially imaged. IMPRESSION: 1. Endotracheal tube seen ending 3 cm above the carina. 2. Vascular congestion and borderline cardiomegaly noted. Bilateral airspace opacification raises concern for pulmonary edema. Small right pleural effusion noted. Electronically Signed   By: Garald Balding M.D.   On: 06/08/2016 22:36   Dg Chest Port 1 View  Result Date: 06/08/2016 CLINICAL DATA:  Respiratory failure.  Assess endotracheal tube. EXAM: PORTABLE CHEST 1 VIEW COMPARISON:  06/07/2016 FINDINGS: Endotracheal tube is 2.3 cm above the carina. Nasogastric tube and feeding tube extend into the  abdomen. Right jugular central line tip is in the SVC region. There continues to be low lung volumes with bibasilar chest densities. Right basilar chest densities probably represent a combination of pleural fluid and consolidation. Difficult to exclude mild pulmonary edema. Surgical hardware in the cervical spine and lumbar spine. Negative for a pneumothorax. IMPRESSION: Low lung volumes with persistent bibasilar opacities, right side greater than left. Increased densities in both lungs may represent some pulmonary edema. Support apparatuses as described. Electronically Signed   By: Markus Daft M.D.   On: 06/08/2016 06:58    CMP Latest Ref Rng & Units 06/08/2016 06/08/2016 06/08/2016  Glucose 65 - 99 mg/dL 110(H) 148(H) 174(H)  BUN 6 - 20 mg/dL 68(H) 64(H) 59(H)  Creatinine 0.44 - 1.00 mg/dL 3.08(H) 2.99(H) 2.78(H)  Sodium 135 - 145 mmol/L 142 140 142  Potassium 3.5 - 5.1 mmol/L 3.3(L) 3.0(L) 3.1(L)  Chloride 101 - 111 mmol/L 105 103 104  CO2 22 - 32 mmol/L 25 25 24   Calcium 8.9 - 10.3 mg/dL 7.8(L) 7.7(L) 7.9(L)  Total Protein 6.5 - 8.1 g/dL - - -  Total Bilirubin 0.3 - 1.2 mg/dL - - -  Alkaline Phos 38 - 126 U/L - - -  AST 15 - 41 U/L - - -  ALT 14 - 54 U/L - - -    CBC Latest Ref Rng & Units 06/08/2016 06/07/2016 06/06/2016  WBC 4.0 - 10.5 K/uL 22.8(H) 30.5(H) 27.0(H)  Hemoglobin 12.0 - 15.0 g/dL 9.8(L) 10.8(L) 11.1(L)  Hematocrit 36.0 - 46.0 % 29.1(L) 31.7(L) 32.7(L)  Platelets 150 - 400 K/uL 89(L) 101(L) 99(L)    Assessment/plan:  Acute hypoxic respiratory failure with pulmonary edema and possible aspiration. Hx of asthma. -  reintubated - f/u CXR, ABG - continue lasix - change to budesonide/brovana with prn xopenex  A fib with RVR. - amiodarone - lopressor IV prn for HR > 125  Cholangitis with bacteremia. - continue Abx  Hx of antiphospholipid Ab syndrome. Hx of heparin allergy. - bivalirudin  Acute metabolic encephalopathy. - precedex, fentanyl gtt  CC time 45 minutes  independent of procedure time.  Chesley Mires, MD [facility] Pulmonary/Critical Care 06/08/2016, 11:44 PM Pager:  [phone] After 3pm call: [phone]

## 2016-06-08 NOTE — Procedures (Signed)
Extubation Procedure Note  Patient Details:   Name: Latoya [name] [facility] DOB: 1946/02/11 MRN: [id number]   Airway Documentation:     Evaluation  O2 sats: stable throughout Complications: No apparent complications Patient did tolerate procedure well. Bilateral Breath Sounds: Diminished, Other (Comment) (Coarse)   Yes, Pt self-Extubated, placed on 4L [place] SPO2 96%.  Pt able to speak clearly  Latoya [name] 06/08/2016, 4:03 PM

## 2016-06-08 NOTE — Progress Notes (Signed)
Spoke with husband, after discussion, no trach/peg, no CPR/cardioversion, no long term dialysis.  Short term only.  Husband is very reasonable and does not want his wife to suffer.  If situation is amenable quickly then these are his wishes but no prolonged treatment.  Rush Farmer, M.D. [facility] Pulmonary/Critical Care Medicine. Pager: [phone]. After hours pager: [phone].

## 2016-06-08 NOTE — Progress Notes (Signed)
Call from RN  Post intubation cxr - severe ards v pulmonary edema pattern; et tube good position Not on presors Fio2 100%, peep 10 per RN CVP 10  Also A Fib RVR despite amio HR 140  Plan Repeat lasix 60mg  IV Lo[ressors prn for amio Continue amio gtt  Dr. Brand Males, M.D., [facility].C.P Pulmonary and Critical Care Medicine Staff Physician [place] Pulmonary and Critical Care Pager: [phone], If no answer or between  15:00h - 7:00h: call [phone]  06/08/2016 11:45 PM

## 2016-06-08 NOTE — Progress Notes (Signed)
[facility] will follow Ms. Mendibles as her pharmacy provider for outpatient IV ABX as ordered at DC. [facility] will partner with the [facility] agency of patients choice for nursing/HH services.   If patient discharges after hours, please call [phone].   Larry Sierras 06/08/2016, 2:52 PM

## 2016-06-08 NOTE — Progress Notes (Signed)
[place] Progress Note Patient Name: Latoya [name] DOB: [birth year] MRN: [id number]   Date of Service  06/08/2016  HPI/Events of Note  Self extubated Stable on cam check  eICU Interventions  Monitor need for reintubation Order restraints so she does not pull the cortrak too.      Intervention Category Evaluation Type: Other  Leena Tiede 06/08/2016, 4:00 PM

## 2016-06-08 NOTE — Progress Notes (Signed)
Subjective: Pt on vent   Objective: Vital signs in last 24 hours: Temp:  [97.5 F (36.4 C)-99.5 F (37.5 C)] 99.5 F (37.5 C) (08/18 0800) Pulse Rate:  [72-135] 72 (08/18 0800) Resp:  [11-30] 21 (08/18 0800) BP: (89-167)/(48-93) 167/79 (08/18 0800) SpO2:  [95 %-100 %] 100 % (08/18 0800) FiO2 (%):  [40 %] 40 % (08/18 0415) Weight:  [84 kg (185 lb 3 oz)] 84 kg (185 lb 3 oz) (08/18 0403) Last BM Date:  (PTA)  Intake/Output from previous day: 08/17 0701 - 08/18 0700 In: 4911.4 [I.V.:3786.9; NG/GT:919.5; IV Piggyback:125] Out: 1273 [Urine:353; Emesis/NG output:650; Drains:270] Intake/Output this shift: No intake/output data recorded.  GI: soft NT perc drain working bile inthe bag  noperitonitis   Lab Results:   Recent Labs  06/07/16 0415 06/08/16 0432  WBC 30.5* 22.8*  HGB 10.8* 9.8*  HCT 31.7* 29.1*  PLT 101* 89*   BMET  Recent Labs  06/07/16 0415 06/08/16 0432  NA 140 142  K 3.2* 3.1*  CL 109 104  CO2 17* 24  GLUCOSE 136* 174*  BUN 47* 59*  CREATININE 2.30* 2.78*  CALCIUM 8.0* 7.9*   PT/INR No results for input(s): LABPROT, INR in the last 72 hours. ABG  Recent Labs  06/07/16 0604 06/08/16 0515  PHART 7.330* 7.436  HCO3 15.8* 22.2    Studies/Results: Dg Abd 1 View  Result Date: 06/07/2016 CLINICAL DATA:  Feeding tube placement. EXAM: ABDOMEN - 1 VIEW; DG NASO G TUBE PLC W/FL-NO RAD COMPARISON:  06/04/2016. FINDINGS: The feeding tube was placed under fluoroscopic guidance by [employer], RT. A single spot image of the mid abdomen was obtained after injection of barium through the tube. This demonstrates the tube extending through the stomach and into the proximal small bowel with its tip obscured by overlying spinal hardware. The injected contrast is filling the proximal jejunum. IMPRESSION: Feeding tube tip in the proximal jejunum, just beyond the ligament of Treitz, obscured by overlying hardware. Electronically Signed   By: Claudie Revering M.D.   On:  06/07/2016 10:21   Dg Chest Port 1 View  Result Date: 06/08/2016 CLINICAL DATA:  Respiratory failure.  Assess endotracheal tube. EXAM: PORTABLE CHEST 1 VIEW COMPARISON:  06/07/2016 FINDINGS: Endotracheal tube is 2.3 cm above the carina. Nasogastric tube and feeding tube extend into the abdomen. Right jugular central line tip is in the SVC region. There continues to be low lung volumes with bibasilar chest densities. Right basilar chest densities probably represent a combination of pleural fluid and consolidation. Difficult to exclude mild pulmonary edema. Surgical hardware in the cervical spine and lumbar spine. Negative for a pneumothorax. IMPRESSION: Low lung volumes with persistent bibasilar opacities, right side greater than left. Increased densities in both lungs may represent some pulmonary edema. Support apparatuses as described. Electronically Signed   By: Markus Daft M.D.   On: 06/08/2016 06:58   Dg Chest Port 1 View  Result Date: 06/07/2016 CLINICAL DATA:  70 y/o  F; cholecystitis. EXAM: PORTABLE CHEST 1 VIEW COMPARISON:  Chest x-ray dated 06/06/2016. FINDINGS: Postsurgical changes related to cervical and thoracolumbar fixation partially visualized. Central venous catheter tip projects over the lower SVC. Endotracheal tube is stable in position. Enteric tube tip projects over mid stomach. Worsening right lung volumes and basilar opacity. Stable left basilar opacity. No pneumothorax. Cardiac silhouette is obscured by basilar opacities. IMPRESSION: Bibasilar opacities increased on the right probably represent atelectasis and effusions. Underlying pneumonia is not excluded. Electronically Signed   By:  Kristine Garbe M.D.   On: 06/07/2016 05:33   Dg [place] Bailey G Tube Plc W/fl-no Rad  Result Date: 06/07/2016 CLINICAL DATA:  NASO G TUBE PLACEMENT WITH FLUORO Fluoroscopy was utilized by the requesting physician.  No radiographic interpretation.    Anti-infectives: Anti-infectives    Start      Dose/Rate Route Frequency Ordered Stop   06/07/16 2200  Ampicillin-Sulbactam (UNASYN) 3 g in sodium chloride 0.9 % 100 mL IVPB     3 g 100 mL/hr over 60 Minutes Intravenous Every 12 hours 06/07/16 1514     06/05/16 0030  vancomycin (VANCOCIN) IVPB 750 mg/150 ml premix  Status:  Discontinued     750 mg 150 mL/hr over 60 Minutes Intravenous Every 12 hours 06/04/16 1135 06/06/16 0937   06/04/16 1300  piperacillin-tazobactam (ZOSYN) IVPB 3.375 g  Status:  Discontinued     3.375 g 12.5 mL/hr over 240 Minutes Intravenous Every 8 hours 06/04/16 0423 06/07/16 1504   06/04/16 1200  vancomycin (VANCOCIN) 1,500 mg in sodium chloride 0.9 % 500 mL IVPB     1,500 mg 250 mL/hr over 120 Minutes Intravenous  Once 06/04/16 1135 06/04/16 1412   06/04/16 0345  piperacillin-tazobactam (ZOSYN) IVPB 3.375 g     3.375 g 100 mL/hr over 30 Minutes Intravenous  Once 06/04/16 0344 06/04/16 0456      Assessment/Plan: Patient Active Problem List   Diagnosis Date Noted  . Enterococcal bacteremia 06/06/2016  . Bacteremia due to Klebsiella pneumoniae 06/06/2016  . Bacteremia due to Escherichia coli 06/06/2016  . Septic shock ([place])   . Acute respiratory failure with hypoxia ([place])   . Ascending cholangitis 06/04/2016  . Sepsis ([place]) 06/04/2016  . Hemangioma of liver 06/04/2016  . Severe sepsis ([place]) 06/04/2016  . Cervical pseudoarthrosis ([place]) 08/31/2014  . Anaphylaxis, mild, due to wasp envenomation 04/12/2013  . Chest pain, atypical 04/11/2013  . PAD (peripheral artery disease) ([place]) 06/20/2011  . ANEMIA, IRON DEFICIENCY, MICROCYTIC 06/01/2010  . Primary hypercoagulable state ([place]) 06/01/2010  . BACK PAIN, CHRONIC 06/01/2010  . ABDOMINAL PAIN -GENERALIZED 06/01/2010  . CAROTID ARTERY DISEASE 05/25/2010  . CAD, NATIVE VESSEL 11/15/2009  . MURMUR 11/15/2009  . CAROTID BRUIT, LEFT 11/15/2009  . HYPERTENSION 03/18/2009  . History of pulmonary embolism 03/18/2009  . PAROXYSMAL ATRIAL FIBRILLATION 03/18/2009   . DVT 03/18/2009  . Asthma 03/18/2009  . GERD 03/18/2009  . SPONDYLOSIS, LUMBAR 03/18/2009  . HYPERLIPIDEMIA 11/29/2008  . CORONARY HEART DISEASE 11/29/2008  biliary tree probably well decompressed through perc drain given amount of bile draining out  Doubt ascending cholangitis an issue at this point  Exam otherwise benign  Nothing else to offer She is off pressors  Continue supportive care at this point    LOS: 4 days    Leisha Trinkle A. 06/08/2016

## 2016-06-08 NOTE — Progress Notes (Signed)
Patient ID: Latoya [name], female   DOB: 06-Oct-1946, 70 y.o.   MRN: [id number]          [facility]ious [facility]    Date of Admission:  06/04/2016   Total days of antibiotics 5        Day 2 ampicillin sulbactam         Principal Problem:   Ascending cholangitis Active Problems:   Enterococcal bacteremia   Bacteremia due to Klebsiella pneumoniae   Bacteremia due to Escherichia coli   PAROXYSMAL ATRIAL FIBRILLATION   Sepsis ([place])   Hemangioma of liver   Severe sepsis ([place])   Septic shock (HCC)   Acute respiratory failure with hypoxia ([place])   . ampicillin-sulbactam (UNASYN) IV  3 g Intravenous Q12H  . antiseptic oral rinse  7 mL Mouth Rinse Q4H  . budesonide (PULMICORT) nebulizer solution  0.25 mg Nebulization BID  . feeding supplement (VITAL HIGH PROTEIN)  1,000 mL Per Tube Q24H  . furosemide  80 mg Intravenous Q12H  . insulin aspart  0-9 Units Subcutaneous Q4H  . ipratropium-albuterol  3 mL Nebulization Q6H  . [START ON 06/09/2016] levothyroxine  75 mcg Per Tube Daily  . pantoprazole sodium  40 mg Per Tube BID  . potassium phosphate IVPB (mmol)  10 mmol Intravenous Once   Review of Systems: Review of Systems  Unable to perform ROS: Intubated    Past Medical History:  Diagnosis Date  . Anemia, iron deficiency   . Antiphospholipid syndrome (HCC)    with hypercoagulable state  . Asthma    extrinsic; moderate, persistant, nml spirometry 2010, nml CXR 1/08  . Bladder troubles    REPORTS INFECTIONS ON OCCASION DUE TO URETHRA MEATUS STRICTURE AT BIRTH   . Blood dyscrasia    antiphospholipid disorder  . CAD (coronary artery disease)    50% mid LAD, 80% ostial D1 and moderate 80% mid circ by vath 2003  . Cancer ([place])    basal cell removed fr. L arm   . Complication of anesthesia    cardiac arrest- in OR, at age 40 (27)y.o. during Scalenotomy in her early 20's  . Coronary heart disease   . Drug allergy    heparin/lovenox  . DVT (deep venous thrombosis)  ([place])   . Erosive gastritis 1994  . Family history of adverse reaction to anesthesia    some family members have had trouble waking up  . GERD (gastroesophageal reflux disease)   . H/O hiatal hernia   . HLD (hyperlipidemia)   . HTN (hypertension)    [name] at [facility], manages pt.  LOV 03/2014  . Hypothyroidism   . Liver spot    cyst- - no biopsy, but told that its benign   . PAF (paroxysmal atrial fibrillation) (HCC)    not on coumadin therapy  . Pulmonary embolism ([place])    related to back surgery with prior coumadin use, now off  . Spondylosis, lumbosacral    ARTHRITIS- OA     Social History  Substance Use Topics  . Smoking status: Never Smoker  . Smokeless tobacco: Never Used     Comment: no smoking   . Alcohol use No    Family History  Problem Relation Age of Onset  . Heart disease Father     MI   . Heart disease Sister   .      Allergies  Allergen Reactions  . Hornet Venom Anaphylaxis  . Pork-Derived Products Anaphylaxis  . Chlorhexidine Itching  Reports that it is the dye in it  . Enoxaparin Sodium     REACTION: thrombocytopenia  . Heparin     REACTION: thrombocytopenia  . Lactose Intolerance ([place])   . Other     Sensitive to dye in Betadine & Chlorohexadine   . Povidone-Iodine     Sensitivity- but if its wiped off she is able to tolerate betadine   . Indomethacin Rash  . Phenylpropanolamine Rash    Other Reaction: other reaction  . Povidone Iodine Rash    OBJECTIVE: Vitals:   06/08/16 1100 06/08/16 1112 06/08/16 1130 06/08/16 1200  BP: (!) 151/85  (!) 145/89 (!) 160/85  Pulse: (!) 107  (!) 106 (!) 101  Resp: 18  (!) 22 12  Temp: 99.7 F (37.6 C)  99.5 F (37.5 C) 99.3 F (37.4 C)  TempSrc:      SpO2: 98% 99% 99% 99%  Weight:      Height:       Body mass index is 36.17 kg/m.  Physical Exam  Constitutional:  She remains intubated but is much more alert and interactive.  Cardiovascular: Regular rhythm.   No murmur heard. Tachycardic.   Pulmonary/Chest: Effort normal and breath sounds normal.  Abdominal: Soft.  She continues to drain a large amount of bile from her cholecystostomy tube.    Lab Results Lab Results  Component Value Date   WBC 22.8 (H) 06/08/2016   HGB 9.8 (L) 06/08/2016   HCT 29.1 (L) 06/08/2016   MCV 82.0 06/08/2016   PLT 89 (L) 06/08/2016    Lab Results  Component Value Date   CREATININE 2.78 (H) 06/08/2016   BUN 59 (H) 06/08/2016   NA 142 06/08/2016   K 3.1 (L) 06/08/2016   CL 104 06/08/2016   CO2 24 06/08/2016    Lab Results  Component Value Date   ALT 218 (H) 06/06/2016   AST 115 (H) 06/06/2016   ALKPHOS 112 06/06/2016   BILITOT 2.8 (H) 06/06/2016     Microbiology: Recent Results (from the past 240 hour(s))  Blood culture (routine x 2)     Status: Abnormal   Collection Time: 06/04/16 12:45 AM  Result Value Ref Range Status   Specimen Description BLOOD LEFT ANTECUBITAL  Final   Special Requests BOTTLES DRAWN AEROBIC AND ANAEROBIC 5CC EA  Final   Culture  Setup Time   Final    GRAM NEGATIVE RODS GRAM POSITIVE COCCI CRITICAL RESULT CALLED TO, READ BACK BY AND VERIFIED WITH: Salome Holmes, PHARMD AT 1415 06/04/16 BY D. VANHOOK IN BOTH AEROBIC AND ANAEROBIC BOTTLES    Culture (A)  Final    ESCHERICHIA COLI KLEBSIELLA PNEUMONIAE ENTEROCOCCUS FAECIUM    Report Status 06/07/2016 FINAL  Final   Organism ID, Bacteria ESCHERICHIA COLI  Final   Organism ID, Bacteria KLEBSIELLA PNEUMONIAE  Final   Organism ID, Bacteria ENTEROCOCCUS FAECIUM  Final      Susceptibility   Escherichia coli - MIC*    AMPICILLIN 8 SENSITIVE Sensitive     CEFAZOLIN <=4 SENSITIVE Sensitive     CEFEPIME <=1 SENSITIVE Sensitive     CEFTAZIDIME <=1 SENSITIVE Sensitive     CEFTRIAXONE <=1 SENSITIVE Sensitive     CIPROFLOXACIN <=0.25 SENSITIVE Sensitive     GENTAMICIN 2 SENSITIVE Sensitive     IMIPENEM <=0.25 SENSITIVE Sensitive     TRIMETH/SULFA <=20 SENSITIVE Sensitive     AMPICILLIN/SULBACTAM <=2 SENSITIVE  Sensitive     PIP/TAZO <=4 SENSITIVE Sensitive     Extended  ESBL NEGATIVE Sensitive     * ESCHERICHIA COLI   Enterococcus faecium - MIC*    AMPICILLIN <=2 SENSITIVE Sensitive     VANCOMYCIN <=0.5 SENSITIVE Sensitive     GENTAMICIN SYNERGY SENSITIVE Sensitive     * ENTEROCOCCUS FAECIUM   Klebsiella pneumoniae - MIC*    AMPICILLIN >=32 RESISTANT Resistant     CEFAZOLIN <=4 SENSITIVE Sensitive     CEFEPIME <=1 SENSITIVE Sensitive     CEFTAZIDIME <=1 SENSITIVE Sensitive     CEFTRIAXONE <=1 SENSITIVE Sensitive     CIPROFLOXACIN <=0.25 SENSITIVE Sensitive     GENTAMICIN <=1 SENSITIVE Sensitive     IMIPENEM <=0.25 SENSITIVE Sensitive     TRIMETH/SULFA <=20 SENSITIVE Sensitive     AMPICILLIN/SULBACTAM 4 SENSITIVE Sensitive     PIP/TAZO <=4 SENSITIVE Sensitive     Extended ESBL NEGATIVE Sensitive     * KLEBSIELLA PNEUMONIAE  Blood Culture ID Panel (Reflexed)     Status: Abnormal   Collection Time: 06/04/16 12:45 AM  Result Value Ref Range Status   Enterococcus species NOT DETECTED NOT DETECTED Final   Vancomycin resistance NOT DETECTED NOT DETECTED Final   Listeria monocytogenes NOT DETECTED NOT DETECTED Final   Staphylococcus species NOT DETECTED NOT DETECTED Final   Staphylococcus aureus NOT DETECTED NOT DETECTED Final   Methicillin resistance NOT DETECTED NOT DETECTED Final   Streptococcus species NOT DETECTED NOT DETECTED Final   Streptococcus agalactiae NOT DETECTED NOT DETECTED Final   Streptococcus pneumoniae NOT DETECTED NOT DETECTED Final   Streptococcus pyogenes NOT DETECTED NOT DETECTED Final   Acinetobacter baumannii NOT DETECTED NOT DETECTED Final   Enterobacteriaceae species DETECTED (A) NOT DETECTED Final    Comment: CRITICAL RESULT CALLED TO, READ BACK BY AND VERIFIED WITH: Salome Holmes, PHARMD AT 1415 06/04/16 BY D. VANHOOK    Enterobacter cloacae complex NOT DETECTED NOT DETECTED Final   Escherichia coli DETECTED (A) NOT DETECTED Final    Comment: CRITICAL RESULT  CALLED TO, READ BACK BY AND VERIFIED WITH: Salome Holmes, PHARMD AT 1415 06/04/16 BY D. VANHOOK    Klebsiella oxytoca NOT DETECTED NOT DETECTED Final   Klebsiella pneumoniae NOT DETECTED NOT DETECTED Final   Proteus species NOT DETECTED NOT DETECTED Final   Serratia marcescens NOT DETECTED NOT DETECTED Final   Carbapenem resistance NOT DETECTED NOT DETECTED Final   Haemophilus influenzae NOT DETECTED NOT DETECTED Final   Neisseria meningitidis NOT DETECTED NOT DETECTED Final   Pseudomonas aeruginosa NOT DETECTED NOT DETECTED Final   Candida albicans NOT DETECTED NOT DETECTED Final   Candida glabrata NOT DETECTED NOT DETECTED Final   Candida krusei NOT DETECTED NOT DETECTED Final   Candida parapsilosis NOT DETECTED NOT DETECTED Final   Candida tropicalis NOT DETECTED NOT DETECTED Final  Blood culture (routine x 2)     Status: Abnormal   Collection Time: 06/04/16 12:54 AM  Result Value Ref Range Status   Specimen Description BLOOD LEFT HAND  Final   Special Requests IN PEDIATRIC BOTTLE 4CC  Final   Culture  Setup Time   Final    GRAM NEGATIVE RODS GRAM POSITIVE COCCI CRITICAL RESULT CALLED TO, READ BACK BY AND VERIFIED WITH: Salome Holmes, PHARMD AT 1415 06/04/16 BY D. VANHOOK AEROBIC BOTTLE ONLY    Culture (A)  Final    ESCHERICHIA COLI ENTEROCOCCUS FAECIUM SUSCEPTIBILITIES PERFORMED ON PREVIOUS CULTURE WITHIN THE LAST 5 DAYS.    Report Status 06/07/2016 FINAL  Final  Urine culture     Status: Abnormal  Collection Time: 06/04/16  1:33 AM  Result Value Ref Range Status   Specimen Description URINE, RANDOM  Final   Special Requests NONE  Final   Culture <10,000 COLONIES/mL INSIGNIFICANT GROWTH (A)  Final   Report Status 06/05/2016 FINAL  Final  MRSA PCR Screening     Status: None   Collection Time: 06/04/16  7:07 AM  Result Value Ref Range Status   MRSA by PCR NEGATIVE NEGATIVE Final    Comment:        The GeneXpert MRSA Assay (FDA approved for NASAL specimens only), is one  component of a comprehensive MRSA colonization surveillance program. It is not intended to diagnose MRSA infection nor to guide or monitor treatment for MRSA infections.   Aerobic/Anaerobic Culture (surgical/deep wound)     Status: None (Preliminary result)   Collection Time: 06/04/16  4:05 PM  Result Value Ref Range Status   Specimen Description ABSCESS GALL BLADDER  Final   Special Requests Normal  Final   Gram Stain   Final    RARE WBC PRESENT, PREDOMINANTLY PMN FEW GRAM NEGATIVE RODS FEW GRAM POSITIVE COCCI IN PAIRS IN CHAINS    Culture   Final    MODERATE ENTEROCOCCUS FAECIUM FEW ESCHERICHIA COLI NO ANAEROBES ISOLATED; CULTURE IN PROGRESS FOR 5 DAYS    Report Status PENDING  Incomplete   Organism ID, Bacteria ENTEROCOCCUS FAECIUM  Final   Organism ID, Bacteria ESCHERICHIA COLI  Final      Susceptibility   Escherichia coli - MIC*    AMPICILLIN 8 SENSITIVE Sensitive     CEFAZOLIN <=4 SENSITIVE Sensitive     CEFEPIME <=1 SENSITIVE Sensitive     CEFTAZIDIME <=1 SENSITIVE Sensitive     CEFTRIAXONE <=1 SENSITIVE Sensitive     CIPROFLOXACIN <=0.25 SENSITIVE Sensitive     GENTAMICIN <=1 SENSITIVE Sensitive     IMIPENEM <=0.25 SENSITIVE Sensitive     TRIMETH/SULFA <=20 SENSITIVE Sensitive     AMPICILLIN/SULBACTAM <=2 SENSITIVE Sensitive     PIP/TAZO <=4 SENSITIVE Sensitive     Extended ESBL NEGATIVE Sensitive     * FEW ESCHERICHIA COLI   Enterococcus faecium - MIC*    AMPICILLIN <=2 SENSITIVE Sensitive     VANCOMYCIN <=0.5 SENSITIVE Sensitive     GENTAMICIN SYNERGY SENSITIVE Sensitive     * MODERATE ENTEROCOCCUS FAECIUM  Culture, blood (routine x 2)     Status: None (Preliminary result)   Collection Time: 06/06/16  3:59 PM  Result Value Ref Range Status   Specimen Description BLOOD RIGHT HAND  Final   Special Requests IN PEDIATRIC BOTTLE 2CC  Final   Culture NO GROWTH < 24 HOURS  Final   Report Status PENDING  Incomplete  Culture, blood (routine x 2)     Status: None  (Preliminary result)   Collection Time: 06/06/16  4:06 PM  Result Value Ref Range Status   Specimen Description BLOOD LEFT HAND  Final   Special Requests IN PEDIATRIC BOTTLE 3CC  Final   Culture NO GROWTH < 24 HOURS  Final   Report Status PENDING  Incomplete     ASSESSMENT: She is improving on antibiotic therapy for cholangitis and polymicrobial bacteremia. Repeat blood cultures are negative at 48 hours.  PLAN: 1. Continue ampicillin sulbactam 2. Please call Dr. Carlyle Basques [phone]) for any infectious disease questions this weekend  Michel Bickers, MD [facility] for [place] [phone] pager   [phone] cell 06/08/2016, 12:20 PM

## 2016-06-08 NOTE — Procedures (Signed)
Intubation Procedure Note ANNASOPHIA PHANOR [id number] [birth year]  Procedure: Intubation Indications: Respiratory insufficiency  Procedure Details Consent: Risks of procedure as well as the alternatives and risks of each were explained to the (patient/caregiver).  Consent for procedure obtained. Time Out: Verified patient identification, verified procedure, site/side was marked, verified correct patient position, special equipment/implants available, medications/allergies/relevent history reviewed, required imaging and test results available.  Performed  Maximum sterile technique was used including gloves, gown, hand hygiene and mask.  3    Evaluation Hemodynamic Status: BP stable throughout; O2 sats: currently acceptable Patient's Current Condition: stable Complications: No apparent complications Patient did tolerate procedure well. Chest X-ray ordered to verify placement.  CXR: pending.  Pt intubated with 7.5 ETT, 24 cm ATL and verified placement by simmetrical chest rise, bilateral breath sounds, positive color change of capnometer, and visual insertion on this date at above time by Governor Rooks., MD. CXR ordered and pending. ABG ordered and pending.  Curt Jews 06/08/2016

## 2016-06-08 NOTE — Progress Notes (Signed)
PULMONARY / CRITICAL CARE MEDICINE   Name: Latoya [name] MRN: [id number] DOB: 1946/02/05    ADMISSION DATE:  06/04/2016  CHIEF COMPLAINT:  Abdominal Pain  HISTORY OF PRESENT ILLNESS:   Latoya [name] is a 70 y/o woman with MMP, most notable for antiphospholipid syndrome on AC (hx of one provoked DVT), as well as CAD and asthma who presented to the ED with sudden onset of chills and abdominal pain. In the ED her clinical status declined, becoming less responsive, more tachycardic and more tachypneic. Imaging studies suggested an enlarged gallbladder.  SUBJECTIVE:  Off pressors and more alert and interactive but agitated overnight.  VITAL SIGNS: BP (!) 167/79 (BP Location: Left Arm)   Pulse 72   Temp 99.5 F (37.5 C)   Resp (!) 21   Ht 5' (1.524 m)   Wt 84 kg (185 lb 3 oz)   SpO2 100%   BMI 36.17 kg/m    HEMODYNAMICS: CVP:  [8 mmHg-16 mmHg] 15 mmHg  VENTILATOR SETTINGS: Vent Mode: PRVC FiO2 (%):  [40 %] 40 % Set Rate:  [30 bmp] 30 bmp Vt Set:  [400 mL] 400 mL PEEP:  [5 cmH20] 5 cmH20 Pressure Support:  [5 cmH20] 5 cmH20 Plateau Pressure:  [9 cmH20-20 cmH20] 9 cmH20  INTAKE / OUTPUT: I/O last 3 completed shifts: In: 6214.2 [I.V.:4929.7; Other:140; NG/GT:919.5; IV Piggyback:225] Out: 4656 [Urine:448; Emesis/NG output:900; Drains:395]  PHYSICAL EXAMINATION: General:  Elderly woman, chronically ill appearing, awake and interactive on vent, off propofol. Neuro:  Awake and interactive, moving all ext to command. HEENT:  ETT, [place]/AT, PERRL, EOM-spontaneous. Cardiovascular:  Tachycardic, sinus, Nl S1/S2, -M/R/G. Lungs:  Coarse BS diffusely. Abdomen:  Obese, NT, ND and +BS, unresponsive. Musculoskeletal:  No joint swelling Skin:  No visible rashes  LABS:  BMET  Recent Labs Lab 06/06/16 0327 06/07/16 0415 06/08/16 0432  NA 140 140 142  K 3.1* 3.2* 3.1*  CL 109 109 104  CO2 20* 17* 24  BUN 38* 47* 59*  CREATININE 1.44* 2.30* 2.78*  GLUCOSE 188* 136* 174*    Electrolytes  Recent Labs Lab 06/06/16 0327 06/07/16 0415 06/08/16 0432  CALCIUM 7.6* 8.0* 7.9*  MG 3.3* 3.3* 2.9*  PHOS 3.7 4.0 2.4*   CBC  Recent Labs Lab 06/06/16 0327 06/07/16 0415 06/08/16 0432  WBC 27.0* 30.5* 22.8*  HGB 11.1* 10.8* 9.8*  HCT 32.7* 31.7* 29.1*  PLT 99* 101* 89*   Coag's  Recent Labs Lab 06/04/16 1314  06/07/16 0745 06/07/16 1945 06/08/16 0432  APTT  --   < > 65* 57* 56*  INR 1.58  --   --   --   --   < > = values in this interval not displayed. Sepsis Markers  Recent Labs Lab 06/04/16 0103 06/04/16 0332 06/04/16 0513  LATICACIDVEN 6.88* 6.06* 6.95*   ABG  Recent Labs Lab 06/06/16 0321 06/07/16 0604 06/08/16 0515  PHART 7.498* 7.330* 7.436  PCO2ART 22.1* 30.8* 33.6*  PO2ART 59.0* 122* 84.6   Liver Enzymes  Recent Labs Lab 06/04/16 0045 06/04/16 0520 06/06/16 0327  AST 500* 752* 115*  ALT 291* 486* 218*  ALKPHOS 138* 129* 112  BILITOT 2.7* 2.5* 2.8*  ALBUMIN 4.5 3.4* 2.5*   Cardiac Enzymes  Recent Labs Lab 06/04/16 0045  TROPONINI <0.03   Glucose  Recent Labs Lab 06/07/16 1149 06/07/16 1546 06/07/16 2016 06/07/16 2359 06/08/16 0353 06/08/16 0806  GLUCAP 121* 152* 190* 184* 168* 186*   Imaging Dg Abd 1 View  Result Date:  06/07/2016 CLINICAL DATA:  Feeding tube placement. EXAM: ABDOMEN - 1 VIEW; DG NASO G TUBE PLC W/FL-NO RAD COMPARISON:  06/04/2016. FINDINGS: The feeding tube was placed under fluoroscopic guidance by [employer], RT. A single spot image of the mid abdomen was obtained after injection of barium through the tube. This demonstrates the tube extending through the stomach and into the proximal small bowel with its tip obscured by overlying spinal hardware. The injected contrast is filling the proximal jejunum. IMPRESSION: Feeding tube tip in the proximal jejunum, just beyond the ligament of Treitz, obscured by overlying hardware. Electronically Signed   By: Claudie Revering M.D.   On: 06/07/2016  10:21   Dg Chest Port 1 View  Result Date: 06/08/2016 CLINICAL DATA:  Respiratory failure.  Assess endotracheal tube. EXAM: PORTABLE CHEST 1 VIEW COMPARISON:  06/07/2016 FINDINGS: Endotracheal tube is 2.3 cm above the carina. Nasogastric tube and feeding tube extend into the abdomen. Right jugular central line tip is in the SVC region. There continues to be low lung volumes with bibasilar chest densities. Right basilar chest densities probably represent a combination of pleural fluid and consolidation. Difficult to exclude mild pulmonary edema. Surgical hardware in the cervical spine and lumbar spine. Negative for a pneumothorax. IMPRESSION: Low lung volumes with persistent bibasilar opacities, right side greater than left. Increased densities in both lungs may represent some pulmonary edema. Support apparatuses as described. Electronically Signed   By: Markus Daft M.D.   On: 06/08/2016 06:58   Dg Addison Bailey G Tube Plc W/fl-no Rad  Result Date: 06/07/2016 CLINICAL DATA:  NASO G TUBE PLACEMENT WITH FLUORO Fluoroscopy was utilized by the requesting physician.  No radiographic interpretation.   STUDIES:  CTA ABD with enlarged galbladder and enlarging hepatic cyst. RUQ [place] noted  CULTURES: Blood 8/14 >>E. Coli and enterococcous and klebsiella. Urine 8/14 >>NTD.  ANTIBIOTICS: Vanc 8/14 >> 8/16 Zosyn 8/14 >>8/16 Unasyn 8/16>>>8/21  SIGNIFICANT EVENTS: Intubated in ED after initial evaluation IR perc drain 8/14  LINES/TUBES: ETT 7.5 mm 8/14>>> Foley 8/14>>> R IJ TLC 8/14>>> Gall bladder drain 8/14>>>  DISCUSSION: 70 y/o woman with severe sepsis of presumed biliary origin  ASSESSMENT / PLAN:  PULMONARY A: Need for Mechanical Ventilation Moderate persistent asthma P:   Hold PS trials for today given a-fib with RVR. ABG and CXR in AM. Nebulized budesonide, ipratropium, and albuterol for asthma.  CARDIOVASCULAR A:  CAD. HTN. PAF. Evolving septic shock. A-fib with RVR. P:  CVP  noted, hold further IVF. Remove pressors from [facility]. D/C stress dose steroids. Hold all anti-HTN. Amiodarone drip with bolus, additional 150 mg bolus.  RENAL A:   Lactic Acidosis P:   Replace electrolytes as indicated. CVP noted. KVO IVF. BMET in AM. D/C lasix. Bicarb drip per cards. U/A noted. Renal consult appreicated.  GASTROINTESTINAL A:   Abnormal LFTs Probable cholangitis P:   Drain placed in by IR. Post pyloric cor-trak. TF per nutrition. No MRCP due to large burden of metallic objects in the patient's body that.  HEMATOLOGIC A:   Hx of Antiphospholipid Ab Syndrome Hx of provoked DVT On rivaroxaban Hx of allergy to Heparin products P:  Bivalirudin. CBC in AM. Monitor coags.  INFECTIOUS A:   Severe sepsis, probable biliary origin P:   Continue abx (zosyn), Vanc off. PTC per IR. Awaiting surgery's input.  ENDOCRINE A:   Hypothyroidism P:   Cont home syndroid. Cortisol level 59. Stress dose steroids, will continue for now until more hemodynamically stable.  NEUROLOGIC A:  Need for sedation P:   D/C Propfol. RASS goal: 0. Precedex if remains agitated. Fentanyl drip.  FAMILY  - Updates: Husband updated bedside.  - Inter-disciplinary family meet or Palliative Care meeting due by:  8/21.  The patient is critically ill with multiple organ systems failure and requires high complexity decision making for assessment and support, frequent evaluation and titration of therapies, application of advanced monitoring technologies and extensive interpretation of multiple databases.   Critical Care Time devoted to patient care services described in this note is  35  Minutes. This time reflects time of care of this signee Dr Jennet Maduro. This critical care time does not reflect procedure time, or teaching time or supervisory time of PA/NP/Med student/Med Resident etc but could involve care discussion time.  Rush Farmer, M.D. [facility] Pulmonary/Critical  Care Medicine. Pager: [phone]. After hours pager: [phone].  06/08/2016, 9:26 AM

## 2016-06-08 NOTE — Progress Notes (Signed)
Husband was notified of patient's status and consented for her to be reintubated.

## 2016-06-08 NOTE — Care Management Important Message (Signed)
Important Message  Patient Details  Name: Latoya [name] MRN: [id number] Date of Birth: October 30, 1945   Medicare Important Message Given:  Yes    Nathen May 06/08/2016, 10:35 AM

## 2016-06-09 ENCOUNTER — Inpatient Hospital Stay (HOSPITAL_COMMUNITY): Payer: Medicare Other

## 2016-06-09 DIAGNOSIS — A419 Sepsis, unspecified organism: Secondary | ICD-10-CM

## 2016-06-09 DIAGNOSIS — R7881 Bacteremia: Secondary | ICD-10-CM

## 2016-06-09 DIAGNOSIS — I4891 Unspecified atrial fibrillation: Secondary | ICD-10-CM

## 2016-06-09 DIAGNOSIS — R6521 Severe sepsis with septic shock: Secondary | ICD-10-CM

## 2016-06-09 LAB — BASIC METABOLIC PANEL
ANION GAP: 15 (ref 5–15)
BUN: 70 mg/dL — ABNORMAL HIGH (ref 6–20)
CALCIUM: 7.9 mg/dL — AB (ref 8.9–10.3)
CO2: 26 mmol/L (ref 22–32)
CREATININE: 3.25 mg/dL — AB (ref 0.44–1.00)
Chloride: 102 mmol/L (ref 101–111)
GFR, EST AFRICAN AMERICAN: 16 mL/min — AB (ref >60)
GFR, EST NON AFRICAN AMERICAN: 14 mL/min — AB (ref >60)
Glucose, Bld: 133 mg/dL — ABNORMAL HIGH (ref 65–99)
Potassium: 3.4 mmol/L — ABNORMAL LOW (ref 3.5–5.1)
SODIUM: 143 mmol/L (ref 135–145)

## 2016-06-09 LAB — BLOOD GAS, ARTERIAL
Acid-base deficit: 0.1 mmol/L (ref 0.0–2.0)
Bicarbonate: 24.4 mEq/L — ABNORMAL HIGH (ref 20.0–24.0)
DRAWN BY: 460981
FIO2: 100
MECHVT: 500 mL
O2 Saturation: 97 %
PEEP: 10 cmH2O
PH ART: 7.378 (ref 7.350–7.450)
PO2 ART: 98.9 mmHg (ref 80.0–100.0)
Patient temperature: 98.6
RATE: 15 resp/min
TCO2: 25.7 mmol/L (ref 0–100)
pCO2 arterial: 42.4 mmHg (ref 35.0–45.0)

## 2016-06-09 LAB — CBC
HCT: 35 % — ABNORMAL LOW (ref 36.0–46.0)
Hemoglobin: 11.9 g/dL — ABNORMAL LOW (ref 12.0–15.0)
MCH: 28.2 pg (ref 26.0–34.0)
MCHC: 34 g/dL (ref 30.0–36.0)
MCV: 82.9 fL (ref 78.0–100.0)
PLATELETS: 127 10*3/uL — AB (ref 150–400)
RBC: 4.22 MIL/uL (ref 3.87–5.11)
RDW: 15 % (ref 11.5–15.5)
WBC: 27.1 10*3/uL — AB (ref 4.0–10.5)

## 2016-06-09 LAB — GLUCOSE, CAPILLARY
GLUCOSE-CAPILLARY: 150 mg/dL — AB (ref 65–99)
GLUCOSE-CAPILLARY: 153 mg/dL — AB (ref 65–99)
GLUCOSE-CAPILLARY: 169 mg/dL — AB (ref 65–99)
Glucose-Capillary: 134 mg/dL — ABNORMAL HIGH (ref 65–99)
Glucose-Capillary: 185 mg/dL — ABNORMAL HIGH (ref 65–99)
Glucose-Capillary: 186 mg/dL — ABNORMAL HIGH (ref 65–99)
Glucose-Capillary: 202 mg/dL — ABNORMAL HIGH (ref 65–99)

## 2016-06-09 LAB — PHOSPHORUS: PHOSPHORUS: 3.5 mg/dL (ref 2.5–4.6)

## 2016-06-09 LAB — APTT
APTT: 58 s — AB (ref 24–36)
aPTT: 65 seconds — ABNORMAL HIGH (ref 24–36)

## 2016-06-09 LAB — MAGNESIUM: Magnesium: 2.7 mg/dL — ABNORMAL HIGH (ref 1.7–2.4)

## 2016-06-09 MED ORDER — DEXMEDETOMIDINE HCL IN NACL 400 MCG/100ML IV SOLN
0.4000 ug/kg/h | INTRAVENOUS | Status: DC
  Administered 2016-06-09: 0.4 ug/kg/h via INTRAVENOUS
  Administered 2016-06-09 – 2016-06-10 (×2): 0.2 ug/kg/h via INTRAVENOUS
  Administered 2016-06-11 – 2016-06-13 (×5): 0.4 ug/kg/h via INTRAVENOUS
  Filled 2016-06-09 (×8): qty 100

## 2016-06-09 MED ORDER — POTASSIUM CHLORIDE 20 MEQ/15ML (10%) PO SOLN
40.0000 meq | Freq: Once | ORAL | Status: AC
  Administered 2016-06-09: 40 meq
  Filled 2016-06-09: qty 30

## 2016-06-09 NOTE — Progress Notes (Signed)
PULMONARY / CRITICAL CARE MEDICINE   Name: Latoya [name] MRN: [id number] DOB: [birth year]    ADMISSION DATE:  06/04/2016  CHIEF COMPLAINT:  Abdominal Pain  HISTORY OF PRESENT ILLNESS:   Latoya [name] is a 70 y/o woman with MMP, most notable for antiphospholipid syndrome on AC (hx of one provoked DVT), as well as CAD and asthma who presented to the ED with sudden onset of chills and abdominal pain. In the ED her clinical status declined, becoming less responsive, more tachycardic and more tachypneic. Imaging studies suggested an enlarged gallbladder.  SUBJECTIVE:  Self extubated late yesterday afternoon.  Tolerated a few hours then required reintubation overnight.   VITAL SIGNS: BP (!) 142/96   Pulse (!) 56   Temp (!) 100.8 F (38.2 C)   Resp (!) 27   Ht 5' (1.524 m)   Wt 81.6 kg (179 lb 14.3 oz)   SpO2 99%   BMI 35.13 kg/m    HEMODYNAMICS: CVP:  [5 mmHg-15 mmHg] 5 mmHg  VENTILATOR SETTINGS: Vent Mode: PRVC FiO2 (%):  [36 %-100 %] 100 % Set Rate:  [12 bmp-30 bmp] 15 bmp Vt Set:  [400 mL-500 mL] 500 mL PEEP:  [5 cmH20-10 cmH20] 10 cmH20 Pressure Support:  [5 cmH20] 5 cmH20 Plateau Pressure:  [9 cmH20-22 cmH20] 22 cmH20  INTAKE / OUTPUT: I/O last 3 completed shifts: In: 6256.3 [I.V.:3881; Other:90; VZ/CH:8850.2; IV Piggyback:639] Out: 7741 [Urine:2195; Emesis/NG output:750; Drains:525]  PHYSICAL EXAMINATION: General:  Elderly woman, uncomfortable appearing Neuro:  Awake, slow to respond, follows commands  HEENT:  ETT, [place]/AT, PERRL, EOM-spontaneous. Cardiovascular:  Tachycardic, Nl S1/S2, -M/R/G. Lungs:  resps even non labored on vent, diminished throughout, few bibasilar crackles  Abdomen:  Obese, NT, ND and +BS Musculoskeletal:  No joint swelling Skin:  No visible rashes  LABS:  BMET  Recent Labs Lab 06/08/16 1415 06/08/16 2100 06/09/16 0405  NA 140 142 143  K 3.0* 3.3* 3.4*  CL 103 105 102  CO2 '25 25 26  ' BUN 64* 68* 70*  CREATININE 2.99* 3.08* 3.25*   GLUCOSE 148* 110* 133*   Electrolytes  Recent Labs Lab 06/07/16 0415 06/08/16 0432 06/08/16 1415 06/08/16 2100 06/09/16 0405  CALCIUM 8.0* 7.9* 7.7* 7.8* 7.9*  MG 3.3* 2.9*  --   --  2.7*  PHOS 4.0 2.4* 3.1 2.8 3.5   CBC  Recent Labs Lab 06/07/16 0415 06/08/16 0432 06/09/16 0405  WBC 30.5* 22.8* 27.1*  HGB 10.8* 9.8* 11.9*  HCT 31.7* 29.1* 35.0*  PLT 101* 89* 127*   Coag's  Recent Labs Lab 06/04/16 1314  06/08/16 0432 06/08/16 1714 06/09/16 0405  APTT  --   < > 56* 50* 58*  INR 1.58  --   --   --   --   < > = values in this interval not displayed. Sepsis Markers  Recent Labs Lab 06/04/16 0103 06/04/16 0332 06/04/16 0513  LATICACIDVEN 6.88* 6.06* 6.95*   ABG  Recent Labs Lab 06/08/16 0515 06/08/16 2320 06/09/16 0500  PHART 7.436 7.393 7.378  PCO2ART 33.6* 38.3 42.4  PO2ART 84.6 84.6 98.9   Liver Enzymes  Recent Labs Lab 06/04/16 0045 06/04/16 0520 06/06/16 0327 06/08/16 1415 06/08/16 2100  AST 500* 752* 115*  --   --   ALT 291* 486* 218*  --   --   ALKPHOS 138* 129* 112  --   --   BILITOT 2.7* 2.5* 2.8*  --   --   ALBUMIN 4.5 3.4* 2.5* 2.4* 2.4*  Cardiac Enzymes  Recent Labs Lab 06/04/16 0045  TROPONINI <0.03   Glucose  Recent Labs Lab 06/08/16 1128 06/08/16 1536 06/08/16 1956 06/09/16 0035 06/09/16 0402 06/09/16 0813  GLUCAP 175* 154* 116* 153* 134* 150*   Imaging Dg Chest Port 1 View  Result Date: 06/09/2016 CLINICAL DATA:  Subsequent encounter. Intubated patient. Respiratory distress. EXAM: PORTABLE CHEST 1 VIEW COMPARISON:  06/08/2016 FINDINGS: Patchy airspace opacity in the left mid and lower lung is similar to the prior exam. Opacity in the right lung has mildly improved. There are no new lung opacities. There probable pleural effusions, small. No pneumothorax. Endotracheal tube is stable with its tip 2.7 cm above the carina. Nasal/orogastric tube passes below the diaphragm well into the stomach. Right internal  jugular central venous line tip projects in the lower superior vena cava, also stable. IMPRESSION: 1. Mild improvement in lung aeration with decreased lung opacity in the right lung. Stable airspace opacity noted in the left lung. 2. No new abnormalities. 3. Support apparatus is stable and well positioned. Electronically Signed   By: Lajean Manes M.D.   On: 06/09/2016 07:38   Dg Chest Port 1 View  Result Date: 06/08/2016 CLINICAL DATA:  Acute onset of respiratory failure. Endotracheal tube placement. Initial encounter. EXAM: PORTABLE CHEST 1 VIEW COMPARISON:  Chest radiograph performed earlier today at 4:23 a.m. FINDINGS: The patient's endotracheal tube is seen ending 3 cm above the carina. An enteric tube is noted extending below the diaphragm. The right IJ line is noted ending about the mid to distal SVC. Vascular congestion is noted. Bilateral airspace opacification raises concern for pulmonary edema. A small right pleural effusion is noted. No pneumothorax is identified. The cardiomediastinal silhouette is borderline enlarged. No acute osseous abnormalities are identified. Thoracolumbar spinal fusion hardware is partially imaged. IMPRESSION: 1. Endotracheal tube seen ending 3 cm above the carina. 2. Vascular congestion and borderline cardiomegaly noted. Bilateral airspace opacification raises concern for pulmonary edema. Small right pleural effusion noted. Electronically Signed   By: Garald Balding M.D.   On: 06/08/2016 22:36   STUDIES:  CTA ABD with enlarged galbladder and enlarging hepatic cyst. RUQ [place] noted  CULTURES: Blood 8/14 >>E. Coli and enterococcous and klebsiella. Urine 8/14 >>NTD.  ANTIBIOTICS: Vanc 8/14 >> 8/16 Zosyn 8/14 >>8/16 Unasyn 8/16>>> planned stop date 8/21  SIGNIFICANT EVENTS: Intubated in ED after initial evaluation IR perc drain 8/14  LINES/TUBES: ETT 7.5 mm 8/14>>>8/18, 8/18>>> Foley 8/14>>> R IJ TLC 8/14>>> Gall bladder drain (IR) 8/14>>>  DISCUSSION: 70  y/o woman with severe sepsis of presumed biliary origin  ASSESSMENT / PLAN:  PULMONARY A: Acute hypoxic respiratory failure with pulmonary edema and possible aspiration. Moderate persistent asthma P:   reintubated overnight  Vent support - 8cc/kg, peep 10  F/u CXR  F/u ABG Lasix as below  budesonide/brovana with prn xopenex  CARDIOVASCULAR A:  CAD. HTN. Evolving septic shock - improved.  A-fib with RVR. P:  Continue amiodarone gtt  lopressor IV prn for HR > 125 Consider addition scheduled B blocker if BP remains improved  Diuresis as able   RENAL A:   Lactic Acidosis Acute oliguric renal failure  - oliguric in setting septic shock - UOP improving with lasix  Hypokalemia  P:   Renal following  KVO IVF. Continue lasix 4m IV q12 per renal  F/u chem  No acute indication for dialysis and family does not want long term dialysis    GASTROINTESTINAL A:   Abnormal LFTs Probable cholangitis P:  Drain placed in by IR. Post pyloric cor-trak. TF per nutrition. No MRCP due to large burden of metallic objects in the patient's body    HEMATOLOGIC A:   Hx of Antiphospholipid Ab Syndrome Hx of provoked DVT On rivaroxaban Hx of allergy to Heparin products P:  Bivalirudin. CBC in AM. Monitor coags.  INFECTIOUS A:   Severe sepsis, probable biliary origin P:   Continue abx as above  PTC per IR.   ENDOCRINE A:   Hypothyroidism P:   Cont home syndroid.   NEUROLOGIC A:   Need for sedation P:   RASS goal: -1 Precedex, Fentanyl drip.  FAMILY  - Updates: no family available 8/19  - Inter-disciplinary family meet or Palliative Care meeting due by:  8/21.   Nickolas Madrid, NP 06/09/2016  8:47 AM Pager: [phone] or [phone]  Attending Note:  I have examined patient, reviewed labs, studies and notes. I have discussed the case with Shon Millet, and I agree with the data and plans as amended above. 70 yo woman with severe sepsis and acute  respiratory failure due to cholangitis, has a perc drain in GB. Course c.b acute renal failure. On my eval she is reintubated, is hemodynamically stable off pressors. Wakes to voice then back to sleep. We will plan to continue MV, abx, lasix as ordered. Continue angiomax. Will need to discuss any improvement or decline with family, use this to determine whether we should continue aggressive care. Note that they do not want significant escalation from here.   Independent critical care time is 35 minutes.   Baltazar Apo, MD, PhD 06/09/2016, 12:29 PM [facility] [phone] or if no answer [phone]

## 2016-06-09 NOTE — Progress Notes (Signed)
[facility] ROUNDING NOTE   Subjective:   Interval History: some response to diuretic challenge   Objective:  Vital signs in last 24 hours:  Temp:  [99 F (37.2 C)-100.9 F (38.3 C)] 100.8 F (38.2 C) (08/19 0700) Pulse Rate:  [31-157] 123 (08/19 0700) Resp:  [11-37] 28 (08/19 0700) BP: (74-188)/(49-123) 139/84 (08/19 0700) SpO2:  [94 %-100 %] 96 % (08/19 0700) FiO2 (%):  [36 %-100 %] 100 % (08/19 0427) Weight:  [81.6 kg (179 lb 14.3 oz)] 81.6 kg (179 lb 14.3 oz) (08/19 0500)  Weight change: -2.4 kg (-5 lb 4.7 oz) Filed Weights   06/07/16 0356 06/08/16 0403 06/09/16 0500  Weight: 81.7 kg (180 lb 1.9 oz) 84 kg (185 lb 3 oz) 81.6 kg (179 lb 14.3 oz)    Intake/Output: I/O last 3 completed shifts: In: 6152.8 [I.V.:3822.5; Other:90; NG/GT:1601.3; IV Piggyback:639] Out: MK:[id number]; Emesis/NG output:750; Drains:525]   Intake/Output this shift:  No intake/output data recorded.   CVS- RRR RS- CTA   Currently ventilator dependent. ABD- BS  Hypoactive bowel sounds  EXT-  Trace 1 +  edema   Basic Metabolic Panel:  Recent Labs Lab 06/05/16 1948 06/06/16 0327 06/07/16 0415 06/08/16 0432 06/08/16 1415 06/08/16 2100 06/09/16 0405  NA 140 140 140 142 140 142 143  K 3.4* 3.1* 3.2* 3.1* 3.0* 3.3* 3.4*  CL 111 109 109 104 103 105 102  CO2 19* 20* 17* 24 25 25 26   GLUCOSE 137* 188* 136* 174* 148* 110* 133*  BUN 35* 38* 47* 59* 64* 68* 70*  CREATININE 1.27* 1.44* 2.30* 2.78* 2.99* 3.08* 3.25*  CALCIUM 7.5* 7.6* 8.0* 7.9* 7.7* 7.8* 7.9*  MG 3.3* 3.3* 3.3* 2.9*  --   --  2.7*  PHOS  --  3.7 4.0 2.4* 3.1 2.8 3.5    Liver Function Tests:  Recent Labs Lab 06/04/16 0045 06/04/16 0520 06/06/16 0327 06/08/16 1415 06/08/16 2100  AST 500* 752* 115*  --   --   ALT 291* 486* 218*  --   --   ALKPHOS 138* 129* 112  --   --   BILITOT 2.7* 2.5* 2.8*  --   --   PROT 7.6 5.7* 5.2*  --   --   ALBUMIN 4.5 3.4* 2.5* 2.4* 2.4*    Recent Labs Lab  06/04/16 0045  LIPASE 26   No results for input(s): AMMONIA in the last 168 hours.  CBC:  Recent Labs Lab 06/05/16 0443 06/06/16 0327 06/07/16 0415 06/08/16 0432 06/09/16 0405  WBC 32.0* 27.0* 30.5* 22.8* 27.1*  HGB 13.5 11.1* 10.8* 9.8* 11.9*  HCT 40.1 32.7* 31.7* 29.1* 35.0*  MCV 84.4 83.2 83.0 82.0 82.9  PLT 122* 99* 101* 89* 127*    Cardiac Enzymes:  Recent Labs Lab 06/04/16 0045  TROPONINI <0.03    BNP: Invalid input(s): POCBNP  CBG:  Recent Labs Lab 06/08/16 1128 06/08/16 1536 06/08/16 1956 06/09/16 0035 06/09/16 0402  GLUCAP 175* 154* 116* 153* 41*    Microbiology: Results for orders placed or performed during the hospital encounter of 06/04/16  Blood culture (routine x 2)     Status: Abnormal   Collection Time: 06/04/16 12:45 AM  Result Value Ref Range Status   Specimen Description BLOOD LEFT ANTECUBITAL  Final   Special Requests BOTTLES DRAWN AEROBIC AND ANAEROBIC 5CC EA  Final   Culture  Setup Time   Final    GRAM NEGATIVE RODS GRAM POSITIVE COCCI CRITICAL RESULT CALLED TO, READ BACK BY  AND VERIFIED WITH: Salome Holmes, PHARMD AT [id number] 06/04/16 BY D. VANHOOK IN BOTH AEROBIC AND ANAEROBIC BOTTLES    Culture (A)  Final    ESCHERICHIA COLI KLEBSIELLA PNEUMONIAE ENTEROCOCCUS FAECIUM    Report Status 06/07/2016 FINAL  Final   Organism ID, Bacteria ESCHERICHIA COLI  Final   Organism ID, Bacteria KLEBSIELLA PNEUMONIAE  Final   Organism ID, Bacteria ENTEROCOCCUS FAECIUM  Final      Susceptibility   Escherichia coli - MIC*    AMPICILLIN 8 SENSITIVE Sensitive     CEFAZOLIN <=4 SENSITIVE Sensitive     CEFEPIME <=1 SENSITIVE Sensitive     CEFTAZIDIME <=1 SENSITIVE Sensitive     CEFTRIAXONE <=1 SENSITIVE Sensitive     CIPROFLOXACIN <=0.25 SENSITIVE Sensitive     GENTAMICIN 2 SENSITIVE Sensitive     IMIPENEM <=0.25 SENSITIVE Sensitive     TRIMETH/SULFA <=20 SENSITIVE Sensitive     AMPICILLIN/SULBACTAM <=2 SENSITIVE Sensitive     PIP/TAZO <=4  SENSITIVE Sensitive     Extended ESBL NEGATIVE Sensitive     * ESCHERICHIA COLI   Enterococcus faecium - MIC*    AMPICILLIN <=2 SENSITIVE Sensitive     VANCOMYCIN <=0.5 SENSITIVE Sensitive     GENTAMICIN SYNERGY SENSITIVE Sensitive     * ENTEROCOCCUS FAECIUM   Klebsiella pneumoniae - MIC*    AMPICILLIN >=32 RESISTANT Resistant     CEFAZOLIN <=4 SENSITIVE Sensitive     CEFEPIME <=1 SENSITIVE Sensitive     CEFTAZIDIME <=1 SENSITIVE Sensitive     CEFTRIAXONE <=1 SENSITIVE Sensitive     CIPROFLOXACIN <=0.25 SENSITIVE Sensitive     GENTAMICIN <=1 SENSITIVE Sensitive     IMIPENEM <=0.25 SENSITIVE Sensitive     TRIMETH/SULFA <=20 SENSITIVE Sensitive     AMPICILLIN/SULBACTAM 4 SENSITIVE Sensitive     PIP/TAZO <=4 SENSITIVE Sensitive     Extended ESBL NEGATIVE Sensitive     * KLEBSIELLA PNEUMONIAE  Blood Culture ID Panel (Reflexed)     Status: Abnormal   Collection Time: 06/04/16 12:45 AM  Result Value Ref Range Status   Enterococcus species NOT DETECTED NOT DETECTED Final   Vancomycin resistance NOT DETECTED NOT DETECTED Final   Listeria monocytogenes NOT DETECTED NOT DETECTED Final   Staphylococcus species NOT DETECTED NOT DETECTED Final   Staphylococcus aureus NOT DETECTED NOT DETECTED Final   Methicillin resistance NOT DETECTED NOT DETECTED Final   Streptococcus species NOT DETECTED NOT DETECTED Final   Streptococcus agalactiae NOT DETECTED NOT DETECTED Final   Streptococcus pneumoniae NOT DETECTED NOT DETECTED Final   Streptococcus pyogenes NOT DETECTED NOT DETECTED Final   Acinetobacter baumannii NOT DETECTED NOT DETECTED Final   Enterobacteriaceae species DETECTED (A) NOT DETECTED Final    Comment: CRITICAL RESULT CALLED TO, READ BACK BY AND VERIFIED WITH: Salome Holmes, PHARMD AT 1415 06/04/16 BY D. VANHOOK    Enterobacter cloacae complex NOT DETECTED NOT DETECTED Final   Escherichia coli DETECTED (A) NOT DETECTED Final    Comment: CRITICAL RESULT CALLED TO, READ BACK BY AND  VERIFIED WITH: Salome Holmes, PHARMD AT 1415 06/04/16 BY D. VANHOOK    Klebsiella oxytoca NOT DETECTED NOT DETECTED Final   Klebsiella pneumoniae NOT DETECTED NOT DETECTED Final   Proteus species NOT DETECTED NOT DETECTED Final   Serratia marcescens NOT DETECTED NOT DETECTED Final   Carbapenem resistance NOT DETECTED NOT DETECTED Final   Haemophilus influenzae NOT DETECTED NOT DETECTED Final   Neisseria meningitidis NOT DETECTED NOT DETECTED Final   Pseudomonas aeruginosa NOT DETECTED NOT  DETECTED Final   Candida albicans NOT DETECTED NOT DETECTED Final   Candida glabrata NOT DETECTED NOT DETECTED Final   Candida krusei NOT DETECTED NOT DETECTED Final   Candida parapsilosis NOT DETECTED NOT DETECTED Final   Candida tropicalis NOT DETECTED NOT DETECTED Final  Blood culture (routine x 2)     Status: Abnormal   Collection Time: 06/04/16 12:54 AM  Result Value Ref Range Status   Specimen Description BLOOD LEFT HAND  Final   Special Requests IN PEDIATRIC BOTTLE 4CC  Final   Culture  Setup Time   Final    GRAM NEGATIVE RODS GRAM POSITIVE COCCI CRITICAL RESULT CALLED TO, READ BACK BY AND VERIFIED WITH: Salome Holmes, PHARMD AT 1415 06/04/16 BY D. VANHOOK AEROBIC BOTTLE ONLY    Culture (A)  Final    ESCHERICHIA COLI ENTEROCOCCUS FAECIUM SUSCEPTIBILITIES PERFORMED ON PREVIOUS CULTURE WITHIN THE LAST 5 DAYS.    Report Status 06/07/2016 FINAL  Final  Urine culture     Status: Abnormal   Collection Time: 06/04/16  1:33 AM  Result Value Ref Range Status   Specimen Description URINE, RANDOM  Final   Special Requests NONE  Final   Culture <10,000 COLONIES/mL INSIGNIFICANT GROWTH (A)  Final   Report Status 06/05/2016 FINAL  Final  MRSA PCR Screening     Status: None   Collection Time: 06/04/16  7:07 AM  Result Value Ref Range Status   MRSA by PCR NEGATIVE NEGATIVE Final    Comment:        The GeneXpert MRSA Assay (FDA approved for NASAL specimens only), is one component of a comprehensive  MRSA colonization surveillance program. It is not intended to diagnose MRSA infection nor to guide or monitor treatment for MRSA infections.   Aerobic/Anaerobic Culture (surgical/deep wound)     Status: None (Preliminary result)   Collection Time: 06/04/16  4:05 PM  Result Value Ref Range Status   Specimen Description ABSCESS GALL BLADDER  Final   Special Requests Normal  Final   Gram Stain   Final    RARE WBC PRESENT, PREDOMINANTLY PMN FEW GRAM NEGATIVE RODS FEW GRAM POSITIVE COCCI IN PAIRS IN CHAINS    Culture   Final    MODERATE ENTEROCOCCUS FAECIUM FEW ESCHERICHIA COLI NO ANAEROBES ISOLATED; CULTURE IN PROGRESS FOR 5 DAYS    Report Status PENDING  Incomplete   Organism ID, Bacteria ENTEROCOCCUS FAECIUM  Final   Organism ID, Bacteria ESCHERICHIA COLI  Final      Susceptibility   Escherichia coli - MIC*    AMPICILLIN 8 SENSITIVE Sensitive     CEFAZOLIN <=4 SENSITIVE Sensitive     CEFEPIME <=1 SENSITIVE Sensitive     CEFTAZIDIME <=1 SENSITIVE Sensitive     CEFTRIAXONE <=1 SENSITIVE Sensitive     CIPROFLOXACIN <=0.25 SENSITIVE Sensitive     GENTAMICIN <=1 SENSITIVE Sensitive     IMIPENEM <=0.25 SENSITIVE Sensitive     TRIMETH/SULFA <=20 SENSITIVE Sensitive     AMPICILLIN/SULBACTAM <=2 SENSITIVE Sensitive     PIP/TAZO <=4 SENSITIVE Sensitive     Extended ESBL NEGATIVE Sensitive     * FEW ESCHERICHIA COLI   Enterococcus faecium - MIC*    AMPICILLIN <=2 SENSITIVE Sensitive     VANCOMYCIN <=0.5 SENSITIVE Sensitive     GENTAMICIN SYNERGY SENSITIVE Sensitive     * MODERATE ENTEROCOCCUS FAECIUM  Culture, blood (routine x 2)     Status: None (Preliminary result)   Collection Time: 06/06/16  3:59 PM  Result Value  Ref Range Status   Specimen Description BLOOD RIGHT HAND  Final   Special Requests IN PEDIATRIC BOTTLE 2CC  Final   Culture NO GROWTH 2 DAYS  Final   Report Status PENDING  Incomplete  Culture, blood (routine x 2)     Status: None (Preliminary result)   Collection  Time: 06/06/16  4:06 PM  Result Value Ref Range Status   Specimen Description BLOOD LEFT HAND  Final   Special Requests IN PEDIATRIC BOTTLE 3CC  Final   Culture NO GROWTH 2 DAYS  Final   Report Status PENDING  Incomplete    Coagulation Studies: No results for input(s): LABPROT, INR in the last 72 hours.  Urinalysis:  Recent Labs  06/07/16 1223  COLORURINE YELLOW  LABSPEC >1.030*  PHURINE 5.5  GLUCOSEU NEGATIVE  HGBUR LARGE*  BILIRUBINUR NEGATIVE  KETONESUR NEGATIVE  PROTEINUR 30*  NITRITE NEGATIVE  LEUKOCYTESUR TRACE*      Imaging: Dg Abd 1 View  Result Date: 06/07/2016 CLINICAL DATA:  Feeding tube placement. EXAM: ABDOMEN - 1 VIEW; DG NASO G TUBE PLC W/FL-NO RAD COMPARISON:  06/04/2016. FINDINGS: The feeding tube was placed under fluoroscopic guidance by [employer], RT. A single spot image of the mid abdomen was obtained after injection of barium through the tube. This demonstrates the tube extending through the stomach and into the proximal small bowel with its tip obscured by overlying spinal hardware. The injected contrast is filling the proximal jejunum. IMPRESSION: Feeding tube tip in the proximal jejunum, just beyond the ligament of Treitz, obscured by overlying hardware. Electronically Signed   By: Claudie Revering M.D.   On: 06/07/2016 10:21   Dg Chest Port 1 View  Result Date: 06/08/2016 CLINICAL DATA:  Acute onset of respiratory failure. Endotracheal tube placement. Initial encounter. EXAM: PORTABLE CHEST 1 VIEW COMPARISON:  Chest radiograph performed earlier today at 4:23 a.m. FINDINGS: The patient's endotracheal tube is seen ending 3 cm above the carina. An enteric tube is noted extending below the diaphragm. The right IJ line is noted ending about the mid to distal SVC. Vascular congestion is noted. Bilateral airspace opacification raises concern for pulmonary edema. A small right pleural effusion is noted. No pneumothorax is identified. The cardiomediastinal silhouette  is borderline enlarged. No acute osseous abnormalities are identified. Thoracolumbar spinal fusion hardware is partially imaged. IMPRESSION: 1. Endotracheal tube seen ending 3 cm above the carina. 2. Vascular congestion and borderline cardiomegaly noted. Bilateral airspace opacification raises concern for pulmonary edema. Small right pleural effusion noted. Electronically Signed   By: Garald Balding M.D.   On: 06/08/2016 22:36   Dg Chest Port 1 View  Result Date: 06/08/2016 CLINICAL DATA:  Respiratory failure.  Assess endotracheal tube. EXAM: PORTABLE CHEST 1 VIEW COMPARISON:  06/07/2016 FINDINGS: Endotracheal tube is 2.3 cm above the carina. Nasogastric tube and feeding tube extend into the abdomen. Right jugular central line tip is in the SVC region. There continues to be low lung volumes with bibasilar chest densities. Right basilar chest densities probably represent a combination of pleural fluid and consolidation. Difficult to exclude mild pulmonary edema. Surgical hardware in the cervical spine and lumbar spine. Negative for a pneumothorax. IMPRESSION: Low lung volumes with persistent bibasilar opacities, right side greater than left. Increased densities in both lungs may represent some pulmonary edema. Support apparatuses as described. Electronically Signed   By: Markus Daft M.D.   On: 06/08/2016 06:58   Dg Loyce Dys Tube Plc W/fl-no Rad  Result Date: 06/07/2016 CLINICAL DATA:  NASO G TUBE PLACEMENT WITH FLUORO Fluoroscopy was utilized by the requesting physician.  No radiographic interpretation.     Medications:   . sodium chloride 10 mL/hr at 06/08/16 1800  . amiodarone 30 mg/hr (06/09/16 0610)  . bivalirudin (ANGIOMAX) infusion 0.5 mg/mL (Non-ACS indications) 0.045 mg/kg/hr (06/08/16 1809)  . dexmedetomidine 0.2 mcg/kg/hr (06/09/16 0355)  . fentaNYL infusion INTRAVENOUS 200 mcg/hr (06/09/16 0432)   . ampicillin-sulbactam (UNASYN) IV  3 g Intravenous Q12H  . antiseptic oral rinse  7 mL  Mouth Rinse Q4H  . arformoterol  15 mcg Nebulization BID  . budesonide (PULMICORT) nebulizer solution  0.5 mg Nebulization BID  . feeding supplement (VITAL HIGH PROTEIN)  1,000 mL Per Tube Q24H  . furosemide  80 mg Intravenous Q12H  . insulin aspart  0-9 Units Subcutaneous Q4H  . levothyroxine  75 mcg Per Tube Daily  . pantoprazole sodium  40 mg Per Tube BID   sodium chloride, levalbuterol, metoprolol, midazolam  Assessment/ Plan:   Acute oliguric renal failure in the setting of septic shock - creatinine appears to be increasing and suspect ATN - will continue to monitor urine output and serial creatinine measurements.   Metabolic acidosis corrected   Hypokalemia will continue runs of potassium  Hypophosphatemia repleted  Acute cholangitis with polymicrobial bacteremia Continue pip/tazo  Hypervolemia  will stop IV fluids and continue diuretics IV 80mg  q 8h appears to have responded to lasix  Hypothyroidism replaced  Antiphospholipid antibody syndrome currently angiomax  Atrial fibrillation - anticoagulation  Asthmatic lung disease   No indications for dialysis this morning     LOS: 5 Latoya [name] W @TODAY @7 :14 AM

## 2016-06-09 NOTE — Progress Notes (Addendum)
[place] for Bivalirudin  Indication: atrial fibrillation   Allergies  Allergen Reactions  . Hornet Venom Anaphylaxis  . Pork-Derived Products Anaphylaxis  . Chlorhexidine Itching    Reports that it is the dye in it  . Enoxaparin Sodium     REACTION: thrombocytopenia  . Heparin     REACTION: thrombocytopenia  . Lactose Intolerance (Gi)   . Other     Sensitive to dye in Betadine & Chlorohexadine   . Povidone-Iodine     Sensitivity- but if its wiped off she is able to tolerate betadine   . Indomethacin Rash  . Phenylpropanolamine Rash    Other Reaction: other reaction  . Povidone Iodine Rash    Patient Measurements: Height: 5' (152.4 cm) Weight: 179 lb 14.3 oz (81.6 kg) IBW/kg (Calculated) : 45.5  Vital Signs: Temp: 100.8 F (38.2 C) (08/19 1015) Temp Source: Core (Comment) (08/19 0800) BP: 122/86 (08/19 1144) Pulse Rate: 114 (08/19 1144)  Labs:  Recent Labs  06/07/16 0415  06/08/16 0432 06/08/16 1415 06/08/16 1714 06/08/16 2100 06/09/16 0405  HGB 10.8*  --  9.8*  --   --   --  11.9*  HCT 31.7*  --  29.1*  --   --   --  35.0*  PLT 101*  --  89*  --   --   --  127*  APTT 69*  < > 56*  --  50*  --  58*  CREATININE 2.30*  --  2.78* 2.99*  --  3.08* 3.25*  < > = values in this interval not displayed.  Estimated Creatinine Clearance: 15.4 mL/min (by C-G formula based on SCr of 3.25 mg/dL).   Assessment: Patient is a 70 yo female who presented 8/14 with sepsis of presumed biliary origin. She was on rivaroxaban PTA for AFib with last dose 8/13PM. She also has h/o APS w/ h/o DVT/PE. She has documented history of HIT from both UFH and enoxaparin. LFTs and T bili were elevated on admission, therefore not using argatroban. APTT this morning is therapeutic at 58.  Goal of Therapy:  Goal aPTT: 50-85 seconds Monitor platelets by anticoagulation protocol: Yes   Plan:  -Continue bivalirudin to 0.045 mg / kg / hr -Monitor aPTT  every 12 hours while in the ICU  -Monitor CBC, renal function, and s/sx of bleeding.   Salome Arnt, PharmD, BCPS Pager # [phone] 06/09/2016 12:19 PM  Addendum: Evening aPTT remains at goal at 65. No bleeding noted.  Salome Arnt, PharmD, BCPS Pager # [phone] 06/09/2016 4:53 PM

## 2016-06-10 ENCOUNTER — Inpatient Hospital Stay (HOSPITAL_COMMUNITY): Payer: Medicare Other

## 2016-06-10 DIAGNOSIS — J80 Acute respiratory distress syndrome: Secondary | ICD-10-CM

## 2016-06-10 LAB — GLUCOSE, CAPILLARY
GLUCOSE-CAPILLARY: 159 mg/dL — AB (ref 65–99)
GLUCOSE-CAPILLARY: 164 mg/dL — AB (ref 65–99)
GLUCOSE-CAPILLARY: 139 mg/dL — AB (ref 65–99)
GLUCOSE-CAPILLARY: 154 mg/dL — AB (ref 65–99)
Glucose-Capillary: 178 mg/dL — ABNORMAL HIGH (ref 65–99)

## 2016-06-10 LAB — RENAL FUNCTION PANEL
ALBUMIN: 1.8 g/dL — AB (ref 3.5–5.0)
ANION GAP: 16 — AB (ref 5–15)
BUN: 84 mg/dL — AB (ref 6–20)
CALCIUM: 7.8 mg/dL — AB (ref 8.9–10.3)
CO2: 27 mmol/L (ref 22–32)
Chloride: 102 mmol/L (ref 101–111)
Creatinine, Ser: 3.83 mg/dL — ABNORMAL HIGH (ref 0.44–1.00)
GFR calc Af Amer: 13 mL/min — ABNORMAL LOW (ref >60)
GFR calc non Af Amer: 11 mL/min — ABNORMAL LOW (ref >60)
GLUCOSE: 174 mg/dL — AB (ref 65–99)
Phosphorus: 3.9 mg/dL (ref 2.5–4.6)
Potassium: 3.6 mmol/L (ref 3.5–5.1)
SODIUM: 145 mmol/L (ref 135–145)

## 2016-06-10 LAB — CBC
HEMATOCRIT: 28.6 % — AB (ref 36.0–46.0)
Hemoglobin: 9.6 g/dL — ABNORMAL LOW (ref 12.0–15.0)
MCH: 28 pg (ref 26.0–34.0)
MCHC: 33.6 g/dL (ref 30.0–36.0)
MCV: 83.4 fL (ref 78.0–100.0)
PLATELETS: 103 10*3/uL — AB (ref 150–400)
RBC: 3.43 MIL/uL — ABNORMAL LOW (ref 3.87–5.11)
RDW: 15.1 % (ref 11.5–15.5)
WBC: 20.1 10*3/uL — AB (ref 4.0–10.5)

## 2016-06-10 LAB — AEROBIC/ANAEROBIC CULTURE (SURGICAL/DEEP WOUND): SPECIAL REQUESTS: NORMAL

## 2016-06-10 LAB — TSH: TSH: 3.666 u[IU]/mL (ref 0.350–4.500)

## 2016-06-10 LAB — ECHOCARDIOGRAM COMPLETE
HEIGHTINCHES: 60 in
WEIGHTICAEL: 2941.82 [oz_av]

## 2016-06-10 LAB — APTT
aPTT: 74 s — ABNORMAL HIGH (ref 24–36)
aPTT: 51 s — ABNORMAL HIGH (ref 24–36)

## 2016-06-10 LAB — AEROBIC/ANAEROBIC CULTURE W GRAM STAIN (SURGICAL/DEEP WOUND)

## 2016-06-10 MED ORDER — AMIODARONE HCL 100 MG PO TABS
100.0000 mg | ORAL_TABLET | Freq: Two times a day (BID) | ORAL | Status: DC
  Administered 2016-06-10 – 2016-06-13 (×7): 100 mg via ORAL
  Filled 2016-06-10 (×11): qty 1

## 2016-06-10 NOTE — Progress Notes (Signed)
[place] for Bivalirudin  Indication: atrial fibrillation   Allergies  Allergen Reactions  . Hornet Venom Anaphylaxis  . Pork-Derived Products Anaphylaxis  . Chlorhexidine Itching    Reports that it is the dye in it  . Enoxaparin Sodium     REACTION: thrombocytopenia  . Heparin     REACTION: thrombocytopenia  . Lactose Intolerance (Gi)   . Other     Sensitive to dye in Betadine & Chlorohexadine   . Povidone-Iodine     Sensitivity- but if its wiped off she is able to tolerate betadine   . Indomethacin Rash  . Phenylpropanolamine Rash    Other Reaction: other reaction  . Povidone Iodine Rash    Patient Measurements: Height: 5' (152.4 cm) Weight: 183 lb 13.8 oz (83.4 kg) IBW/kg (Calculated) : 45.5  Vital Signs: Temp: 97.9 F (36.6 C) (08/20 1600) Temp Source: Core (Comment) (08/20 1600) BP: 157/74 (08/20 1800) Pulse Rate: 84 (08/20 1800)  Labs:  Recent Labs  06/08/16 0432  06/08/16 2100 06/09/16 0405 06/09/16 1628 06/10/16 0430 06/10/16 1754  HGB 9.8*  --   --  11.9*  --  9.6*  --   HCT 29.1*  --   --  35.0*  --  28.6*  --   PLT 89*  --   --  127*  --  103*  --   APTT 56*  < >  --  58* 65* 74* 51*  CREATININE 2.78*  < > 3.08* 3.25*  --  3.83*  --   < > = values in this interval not displayed.  Estimated Creatinine Clearance: 13.3 mL/min (by C-G formula based on SCr of 3.83 mg/dL).   Assessment: Patient is a 70 yo female who presented 8/14 with sepsis of presumed biliary origin. She was on rivaroxaban PTA for AFib with last dose 8/13PM. She also has h/o APS w/ h/o DVT/PE. She has documented allergy to both UFH and enoxaparin. LFTs and T bili were elevated on admission, therefore not using argatroban.  -aPTT= 51 and on the low end of goal  Goal of Therapy:  Goal aPTT: 50-85 seconds Monitor platelets by anticoagulation protocol: Yes   Plan:  -Increase bivalirudin to 0.05 mg / kg / hr -Monitor aPTT every 12 hours for  now -Monitor CBC, renal function  Hildred Laser, Pharm D 06/10/2016 6:59 PM

## 2016-06-10 NOTE — Progress Notes (Signed)
  Echocardiogram 2D Echocardiogram has been performed.  Latoya [name] 06/10/2016, 4:43 PM

## 2016-06-10 NOTE — Progress Notes (Signed)
Pharmacy Antibiotic Note  Latoya [name] is a 70 y.o. female admitted on 06/04/2016 with bacteremia/intra-abdominal infection.  Pharmacy has been consulted to narrow antibiotics to Unasyn. Scr is trending up but no plans for HD at this time. Pt with Tmax of 101.5 and WBC 20.1.   Plan: - Continue Unasyn 3gm IV Q12H - Monitor renal fxn, clinical progress  Height: 5' (152.4 cm) Weight: 183 lb 13.8 oz (83.4 kg) IBW/kg (Calculated) : 45.5  Temp (24hrs), Avg:101 F (38.3 C), Min:99 F (37.2 C), Max:101.5 F (38.6 C)   Recent Labs Lab 06/04/16 0103 06/04/16 0332 06/04/16 0513  06/06/16 0327 06/06/16 1050 06/07/16 0415 06/08/16 0432 06/08/16 1415 06/08/16 2100 06/09/16 0405 06/10/16 0430  WBC  --   --   --   < > 27.0*  --  30.5* 22.8*  --   --  27.1* 20.1*  CREATININE  --   --   --   < > 1.44*  --  2.30* 2.78* 2.99* 3.08* 3.25* 3.83*  LATICACIDVEN 6.88* 6.06* 6.95*  --   --   --   --   --   --   --   --   --   VANCOTROUGH  --   --   --   --   --  34*  --   --   --   --   --   --   < > = values in this interval not displayed.  Estimated Creatinine Clearance: 13.3 mL/min (by C-G formula based on SCr of 3.83 mg/dL).    Allergies  Allergen Reactions  . Hornet Venom Anaphylaxis  . Pork-Derived Products Anaphylaxis  . Chlorhexidine Itching    Reports that it is the dye in it  . Enoxaparin Sodium     REACTION: thrombocytopenia  . Heparin     REACTION: thrombocytopenia  . Lactose Intolerance (Gi)   . Other     Sensitive to dye in Betadine & Chlorohexadine   . Povidone-Iodine     Sensitivity- but if its wiped off she is able to tolerate betadine   . Indomethacin Rash  . Phenylpropanolamine Rash    Other Reaction: other reaction  . Povidone Iodine Rash    Antimicrobials this admission: 8/14 Zosyn >> 8/17 8/14 Vanc >> 8/16 8/17 Unasyn >>  Dose adjustments this admission: N/A  Microbiology results: 8/16 BCx x2: in process  8/14 Gall bladder abscess: enterococcus  (pan-S) 8/14 0045 BCx: E coli (pan-S), Kleb pneumo (R-amp, all else-S), Enterococcus (pan-S) 8/14 0054 BCx: E coli, GPCs - reincubated  8/14 UCx: insignificant  8/14 MRSA PCR: negative    Salome Arnt, PharmD, BCPS Pager # [phone] 06/10/2016 9:48 AM

## 2016-06-10 NOTE — Progress Notes (Signed)
PULMONARY / CRITICAL CARE MEDICINE   Name: XIAMARA HULET MRN: [id number] DOB: [birth year]    ADMISSION DATE:  06/04/2016  CHIEF COMPLAINT:  Abdominal Pain  HISTORY OF PRESENT ILLNESS:   Ms. Whipple is a 70 y/o woman with MMP, most notable for antiphospholipid syndrome on AC (hx of one provoked DVT), as well as CAD and asthma who presented to the ED with sudden onset of chills and abdominal pain. In the ED her clinical status declined, becoming less responsive, more tachycardic and more tachypneic. Imaging studies suggested an enlarged gallbladder.  SUBJECTIVE:  Improved UOP with diuresis per renal.   VITAL SIGNS: BP (!) 88/57   Pulse 69   Temp 99 F (37.2 C)   Resp 13   Ht 5' (1.524 m)   Wt 83.4 kg (183 lb 13.8 oz)   SpO2 100%   BMI 35.91 kg/m    HEMODYNAMICS: CVP:  [5 mmHg-10 mmHg] 8 mmHg  VENTILATOR SETTINGS: Vent Mode: PRVC FiO2 (%):  [60 %-90 %] 90 % Set Rate:  [15 bmp] 15 bmp Vt Set:  [500 mL] 500 mL PEEP:  [10 cmH20] 10 cmH20 Plateau Pressure:  [18 cmH20-28 cmH20] 21 cmH20  INTAKE / OUTPUT: I/O last 3 completed shifts: In: 7824 [I.V.:1962; NG/GT:1155; IV Piggyback:200] Out: 2353 [Urine:2860; Drains:610]  PHYSICAL EXAMINATION: General:  Elderly woman, NAD on vent  Neuro:  Awake, RASS -1, slow to respond, will wake and follows commands  HEENT:  ETT, [place]/AT, PERRL, EOM-spontaneous. Cardiovascular:  Tachycardic, Nl S1/S2, -M/R/G. Lungs:  resps even non labored on vent, diminished throughout, few bibasilar crackles  Abdomen:  Obese, NT, ND and +BS Musculoskeletal:  No joint swelling Skin:  No visible rashes  LABS:  BMET  Recent Labs Lab 06/08/16 2100 06/09/16 0405 06/10/16 0430  NA 142 143 145  K 3.3* 3.4* 3.6  CL 105 102 102  CO2 '25 26 27  ' BUN 68* 70* 84*  CREATININE 3.08* 3.25* 3.83*  GLUCOSE 110* 133* 174*   Electrolytes  Recent Labs Lab 06/07/16 0415 06/08/16 0432  06/08/16 2100 06/09/16 0405 06/10/16 0430  CALCIUM 8.0* 7.9*  < > 7.8*  7.9* 7.8*  MG 3.3* 2.9*  --   --  2.7*  --   PHOS 4.0 2.4*  < > 2.8 3.5 3.9  < > = values in this interval not displayed. CBC  Recent Labs Lab 06/08/16 0432 06/09/16 0405 06/10/16 0430  WBC 22.8* 27.1* 20.1*  HGB 9.8* 11.9* 9.6*  HCT 29.1* 35.0* 28.6*  PLT 89* 127* 103*   Coag's  Recent Labs Lab 06/04/16 1314  06/09/16 0405 06/09/16 1628 06/10/16 0430  APTT  --   < > 58* 65* 74*  INR 1.58  --   --   --   --   < > = values in this interval not displayed. Sepsis Markers  Recent Labs Lab 06/04/16 0103 06/04/16 0332 06/04/16 0513  LATICACIDVEN 6.88* 6.06* 6.95*   ABG  Recent Labs Lab 06/08/16 0515 06/08/16 2320 06/09/16 0500  PHART 7.436 7.393 7.378  PCO2ART 33.6* 38.3 42.4  PO2ART 84.6 84.6 98.9   Liver Enzymes  Recent Labs Lab 06/04/16 0045 06/04/16 0520 06/06/16 0327 06/08/16 1415 06/08/16 2100 06/10/16 0430  AST 500* 752* 115*  --   --   --   ALT 291* 486* 218*  --   --   --   ALKPHOS 138* 129* 112  --   --   --   BILITOT 2.7* 2.5* 2.8*  --   --   --  ALBUMIN 4.5 3.4* 2.5* 2.4* 2.4* 1.8*   Cardiac Enzymes  Recent Labs Lab 06/04/16 0045  TROPONINI <0.03   Glucose  Recent Labs Lab 06/09/16 1211 06/09/16 1626 06/09/16 1950 06/09/16 2347 06/10/16 0342 06/10/16 0808  GLUCAP 185* 186* 202* 169* 178* 159*   Imaging No results found.   STUDIES:  CTA ABD with enlarged galbladder and enlarging hepatic cyst. RUQ [place] noted  CULTURES: Blood 8/14 >>E. Coli and enterococcous and klebsiella. Urine 8/14 >>NTD. Abscess 8/14>>>E.Coli and enterococcus (both pan sens)   ANTIBIOTICS: Vanc 8/14 >> 8/16 Zosyn 8/14 >>8/16 Unasyn 8/16>>> planned stop date 8/21  SIGNIFICANT EVENTS: Intubated in ED after initial evaluation IR perc drain 8/14  LINES/TUBES: ETT 7.5 mm 8/14>>>8/18, 8/18>>> Foley 8/14>>> R IJ TLC 8/14>>> Gall bladder drain (IR) 8/14>>>  DISCUSSION: 70 y/o woman with respiratory failure, severe sepsis of presumed biliary  origin and AKI  ASSESSMENT / PLAN:  PULMONARY A: Acute hypoxic respiratory failure with pulmonary edema and possible aspiration. Moderate persistent asthma P:   Vent support - 8cc/kg, peep 10  Daily SBT  F/u CXR  F/u ABG Lasix as below  budesonide/brovana with prn xopenex  CARDIOVASCULAR A:  CAD. HTN. Evolving septic shock - improved.  A-fib with RVR. P:  Transition amiodarone to per tube 8/20 lopressor IV prn for HR > 125 Diuresis as able  May need low dose pressor to allow further diuresis   RENAL A:   Lactic Acidosis Acute oliguric renal failure  - oliguric in setting septic shock - Scr still rising slowly but UOP improving with lasix  Hypokalemia  P:   Renal following  KVO IVF. Continue lasix 82m IV q8 per renal - watch BP as above  F/u chem  No acute indication for dialysis and family does not want long term dialysis    GASTROINTESTINAL A:   Abnormal LFTs Probable cholangitis P:   Drain placed in by IR. Post pyloric cor-trak. TF per nutrition. No MRCP due to large burden of metallic objects in the patient's body  ?further intervention needed - surgery following peripherally   HEMATOLOGIC A:   Hx of Antiphospholipid Ab Syndrome Hx of provoked DVT On rivaroxaban Hx of allergy to Heparin products P:  Bivalirudin per pharmacy  CBC in AM. Monitor coags.  INFECTIOUS A:   Severe sepsis, probable biliary origin P:   Continue abx as above  PTC per IR.   ENDOCRINE A:   Hypothyroidism P:   Cont home syndroid. Check TSH  NEUROLOGIC A:   Need for sedation P:   RASS goal: -1 Precedex, Fentanyl drip.  FAMILY  - Updates: no family available 8/19  - Inter-disciplinary family meet or Palliative Care meeting due by:  8/21.   KNickolas Madrid NP 06/10/2016  8:42 AM Pager: [phone]  Attending Note:  I have examined patient, reviewed labs, studies and notes. I have discussed the case with KShon Millet and I  agree with the data and plans as amended above. 70yo woman with severe sepsis and acute respiratory failure due to cholangitis, has a perc drain in GB. Course c/b acute renal failure. On my eval she is intubated, is hemodynamically stable off pressors. Wakes to voice then back to sleep. We will plan to continue MV, abx, lasix as ordered. Appreciate Renal's assistance. Agree with advice that HD likely not in her overall interests. Continue angiomax. Will need to discuss any improvement or decline with family, use this to determine whether we should  continue aggressive care. Note that they do not want significant escalation from here. Independent critical care time is 35 minutes.   Baltazar Apo, MD, PhD 06/10/2016, 11:16 AM [facility] [phone] or if no answer [phone]

## 2016-06-10 NOTE — Progress Notes (Addendum)
[place] for Bivalirudin  Indication: atrial fibrillation   Allergies  Allergen Reactions  . Hornet Venom Anaphylaxis  . Pork-Derived Products Anaphylaxis  . Chlorhexidine Itching    Reports that it is the dye in it  . Enoxaparin Sodium     REACTION: thrombocytopenia  . Heparin     REACTION: thrombocytopenia  . Lactose Intolerance (Gi)   . Other     Sensitive to dye in Betadine & Chlorohexadine   . Povidone-Iodine     Sensitivity- but if its wiped off she is able to tolerate betadine   . Indomethacin Rash  . Phenylpropanolamine Rash    Other Reaction: other reaction  . Povidone Iodine Rash    Patient Measurements: Height: 5' (152.4 cm) Weight: 183 lb 13.8 oz (83.4 kg) IBW/kg (Calculated) : 45.5  Vital Signs: Temp: 99 F (37.2 C) (08/20 0600) Temp Source: Core (Comment) (08/20 0000) BP: 88/57 (08/20 0600) Pulse Rate: 69 (08/20 0600)  Labs:  Recent Labs  06/08/16 0432  06/08/16 2100 06/09/16 0405 06/09/16 1628 06/10/16 0430  HGB 9.8*  --   --  11.9*  --  9.6*  HCT 29.1*  --   --  35.0*  --  28.6*  PLT 89*  --   --  127*  --  103*  APTT 56*  < >  --  58* 65* 74*  CREATININE 2.78*  < > 3.08* 3.25*  --  3.83*  < > = values in this interval not displayed.  Estimated Creatinine Clearance: 13.3 mL/min (by C-G formula based on SCr of 3.83 mg/dL).   Assessment: Patient is a 70 yo female who presented 8/14 with sepsis of presumed biliary origin. She was on rivaroxaban PTA for AFib with last dose 8/13PM. She also has h/o APS w/ h/o DVT/PE. She has documented history of HIT from both UFH and enoxaparin. LFTs and T bili were elevated on admission, therefore not using argatroban. APTT this morning is therapeutic at 74 but this has been trending up. Will monitor closely.   Goal of Therapy:  Goal aPTT: 50-85 seconds Monitor platelets by anticoagulation protocol: Yes   Plan:  -Continue bivalirudin to 0.045 mg / kg / hr -Monitor  aPTT every 12 hours while in the ICU  -Monitor CBC, renal function, and s/sx of bleeding.   Salome Arnt, PharmD, BCPS Pager # [phone] 06/10/2016 9:46 AM  Addendum: APTT remains therapeutic at 51. Continue current rate.  Salome Arnt, PharmD, BCPS Pager # [phone] 06/10/2016 6:17 PM

## 2016-06-10 NOTE — Progress Notes (Signed)
[facility] ROUNDING NOTE   Subjective:   Interval History: some response to diuretic challenge   Objective:  Vital signs in last 24 hours:  Temp:  [99 F (37.2 C)-101.5 F (38.6 C)] 99 F (37.2 C) (08/20 0600) Pulse Rate:  [41-137] 69 (08/20 0600) Resp:  [13-31] 13 (08/20 0600) BP: (73-148)/(45-108) 88/57 (08/20 0600) SpO2:  [92 %-100 %] 100 % (08/20 0733) FiO2 (%):  [60 %-90 %] 90 % (08/20 0733) Weight:  [83.4 kg (183 lb 13.8 oz)] 83.4 kg (183 lb 13.8 oz) (08/20 0500)  Weight change: 1.8 kg (3 lb 15.5 oz) Filed Weights   06/08/16 0403 06/09/16 0500 06/10/16 0500  Weight: 84 kg (185 lb 3 oz) 81.6 kg (179 lb 14.3 oz) 83.4 kg (183 lb 13.8 oz)    Intake/Output: I/O last 3 completed shifts: In: [id number] [I.V.:1962; NG/GT:1155; IV Piggyback:200] Out: [id number] [Urine:2860; Drains:610]   Intake/Output this shift:  No intake/output data recorded.   CVS- RRR RS- CTA   Currently ventilator dependent. ABD- BS  Hypoactive bowel sounds  EXT-  Trace 1 +  edema   Basic Metabolic Panel:  Recent Labs Lab 06/05/16 1948 06/06/16 0327 06/07/16 0415 06/08/16 0432 06/08/16 1415 06/08/16 2100 06/09/16 0405 06/10/16 0430  NA 140 140 140 142 140 142 143 145  K 3.4* 3.1* 3.2* 3.1* 3.0* 3.3* 3.4* 3.6  CL 111 109 109 104 103 105 102 102  CO2 19* 20* 17* 24 25 25 26 27   GLUCOSE 137* 188* 136* 174* 148* 110* 133* 174*  BUN 35* 38* 47* 59* 64* 68* 70* 84*  CREATININE 1.27* 1.44* 2.30* 2.78* 2.99* 3.08* 3.25* 3.83*  CALCIUM 7.5* 7.6* 8.0* 7.9* 7.7* 7.8* 7.9* 7.8*  MG 3.3* 3.3* 3.3* 2.9*  --   --  2.7*  --   PHOS  --  3.7 4.0 2.4* 3.1 2.8 3.5 3.9    Liver Function Tests:  Recent Labs Lab 06/04/16 0045 06/04/16 0520 06/06/16 0327 06/08/16 1415 06/08/16 2100 06/10/16 0430  AST 500* 752* 115*  --   --   --   ALT 291* 486* 218*  --   --   --   ALKPHOS 138* 129* 112  --   --   --   BILITOT 2.7* 2.5* 2.8*  --   --   --   PROT 7.6 5.7* 5.2*  --   --   --   ALBUMIN 4.5  3.4* 2.5* 2.4* 2.4* 1.8*    Recent Labs Lab 06/04/16 0045  LIPASE 26   No results for input(s): AMMONIA in the last 168 hours.  CBC:  Recent Labs Lab 06/06/16 0327 06/07/16 0415 06/08/16 0432 06/09/16 0405 06/10/16 0430  WBC 27.0* 30.5* 22.8* 27.1* 20.1*  HGB 11.1* 10.8* 9.8* 11.9* 9.6*  HCT 32.7* 31.7* 29.1* 35.0* 28.6*  MCV 83.2 83.0 82.0 82.9 83.4  PLT 99* 101* 89* 127* 103*    Cardiac Enzymes:  Recent Labs Lab 06/04/16 0045  TROPONINI <0.03    BNP: Invalid input(s): POCBNP  CBG:  Recent Labs Lab 06/09/16 1626 06/09/16 1950 06/09/16 2347 06/10/16 0342 06/10/16 0808  GLUCAP 186* 202* 169* 178* 159*    Microbiology: Results for orders placed or performed during the hospital encounter of 06/04/16  Blood culture (routine x 2)     Status: Abnormal   Collection Time: 06/04/16 12:45 AM  Result Value Ref Range Status   Specimen Description BLOOD LEFT ANTECUBITAL  Final   Special Requests BOTTLES DRAWN AEROBIC AND ANAEROBIC  5CC EA  Final   Culture  Setup Time   Final    GRAM NEGATIVE RODS GRAM POSITIVE COCCI CRITICAL RESULT CALLED TO, READ BACK BY AND VERIFIED WITH: Salome Holmes, PHARMD AT [id number] 06/04/16 BY D. VANHOOK IN BOTH AEROBIC AND ANAEROBIC BOTTLES    Culture (A)  Final    ESCHERICHIA COLI KLEBSIELLA PNEUMONIAE ENTEROCOCCUS FAECIUM    Report Status 06/07/2016 FINAL  Final   Organism ID, Bacteria ESCHERICHIA COLI  Final   Organism ID, Bacteria KLEBSIELLA PNEUMONIAE  Final   Organism ID, Bacteria ENTEROCOCCUS FAECIUM  Final      Susceptibility   Escherichia coli - MIC*    AMPICILLIN 8 SENSITIVE Sensitive     CEFAZOLIN <=4 SENSITIVE Sensitive     CEFEPIME <=1 SENSITIVE Sensitive     CEFTAZIDIME <=1 SENSITIVE Sensitive     CEFTRIAXONE <=1 SENSITIVE Sensitive     CIPROFLOXACIN <=0.25 SENSITIVE Sensitive     GENTAMICIN 2 SENSITIVE Sensitive     IMIPENEM <=0.25 SENSITIVE Sensitive     TRIMETH/SULFA <=20 SENSITIVE Sensitive      AMPICILLIN/SULBACTAM <=2 SENSITIVE Sensitive     PIP/TAZO <=4 SENSITIVE Sensitive     Extended ESBL NEGATIVE Sensitive     * ESCHERICHIA COLI   Enterococcus faecium - MIC*    AMPICILLIN <=2 SENSITIVE Sensitive     VANCOMYCIN <=0.5 SENSITIVE Sensitive     GENTAMICIN SYNERGY SENSITIVE Sensitive     * ENTEROCOCCUS FAECIUM   Klebsiella pneumoniae - MIC*    AMPICILLIN >=32 RESISTANT Resistant     CEFAZOLIN <=4 SENSITIVE Sensitive     CEFEPIME <=1 SENSITIVE Sensitive     CEFTAZIDIME <=1 SENSITIVE Sensitive     CEFTRIAXONE <=1 SENSITIVE Sensitive     CIPROFLOXACIN <=0.25 SENSITIVE Sensitive     GENTAMICIN <=1 SENSITIVE Sensitive     IMIPENEM <=0.25 SENSITIVE Sensitive     TRIMETH/SULFA <=20 SENSITIVE Sensitive     AMPICILLIN/SULBACTAM 4 SENSITIVE Sensitive     PIP/TAZO <=4 SENSITIVE Sensitive     Extended ESBL NEGATIVE Sensitive     * KLEBSIELLA PNEUMONIAE  Blood Culture ID Panel (Reflexed)     Status: Abnormal   Collection Time: 06/04/16 12:45 AM  Result Value Ref Range Status   Enterococcus species NOT DETECTED NOT DETECTED Final   Vancomycin resistance NOT DETECTED NOT DETECTED Final   Listeria monocytogenes NOT DETECTED NOT DETECTED Final   Staphylococcus species NOT DETECTED NOT DETECTED Final   Staphylococcus aureus NOT DETECTED NOT DETECTED Final   Methicillin resistance NOT DETECTED NOT DETECTED Final   Streptococcus species NOT DETECTED NOT DETECTED Final   Streptococcus agalactiae NOT DETECTED NOT DETECTED Final   Streptococcus pneumoniae NOT DETECTED NOT DETECTED Final   Streptococcus pyogenes NOT DETECTED NOT DETECTED Final   Acinetobacter baumannii NOT DETECTED NOT DETECTED Final   Enterobacteriaceae species DETECTED (A) NOT DETECTED Final    Comment: CRITICAL RESULT CALLED TO, READ BACK BY AND VERIFIED WITH: Salome Holmes, PHARMD AT 1415 06/04/16 BY D. VANHOOK    Enterobacter cloacae complex NOT DETECTED NOT DETECTED Final   Escherichia coli DETECTED (A) NOT DETECTED  Final    Comment: CRITICAL RESULT CALLED TO, READ BACK BY AND VERIFIED WITH: Salome Holmes, PHARMD AT 1415 06/04/16 BY D. VANHOOK    Klebsiella oxytoca NOT DETECTED NOT DETECTED Final   Klebsiella pneumoniae NOT DETECTED NOT DETECTED Final   Proteus species NOT DETECTED NOT DETECTED Final   Serratia marcescens NOT DETECTED NOT DETECTED Final   Carbapenem resistance NOT  DETECTED NOT DETECTED Final   Haemophilus influenzae NOT DETECTED NOT DETECTED Final   Neisseria meningitidis NOT DETECTED NOT DETECTED Final   Pseudomonas aeruginosa NOT DETECTED NOT DETECTED Final   Candida albicans NOT DETECTED NOT DETECTED Final   Candida glabrata NOT DETECTED NOT DETECTED Final   Candida krusei NOT DETECTED NOT DETECTED Final   Candida parapsilosis NOT DETECTED NOT DETECTED Final   Candida tropicalis NOT DETECTED NOT DETECTED Final  Blood culture (routine x 2)     Status: Abnormal   Collection Time: 06/04/16 12:54 AM  Result Value Ref Range Status   Specimen Description BLOOD LEFT HAND  Final   Special Requests IN PEDIATRIC BOTTLE 4CC  Final   Culture  Setup Time   Final    GRAM NEGATIVE RODS GRAM POSITIVE COCCI CRITICAL RESULT CALLED TO, READ BACK BY AND VERIFIED WITH: Salome Holmes, PHARMD AT 1415 06/04/16 BY D. VANHOOK AEROBIC BOTTLE ONLY    Culture (A)  Final    ESCHERICHIA COLI ENTEROCOCCUS FAECIUM SUSCEPTIBILITIES PERFORMED ON PREVIOUS CULTURE WITHIN THE LAST 5 DAYS.    Report Status 06/07/2016 FINAL  Final  Urine culture     Status: Abnormal   Collection Time: 06/04/16  1:33 AM  Result Value Ref Range Status   Specimen Description URINE, RANDOM  Final   Special Requests NONE  Final   Culture <10,000 COLONIES/mL INSIGNIFICANT GROWTH (A)  Final   Report Status 06/05/2016 FINAL  Final  MRSA PCR Screening     Status: None   Collection Time: 06/04/16  7:07 AM  Result Value Ref Range Status   MRSA by PCR NEGATIVE NEGATIVE Final    Comment:        The GeneXpert MRSA Assay (FDA approved for  NASAL specimens only), is one component of a comprehensive MRSA colonization surveillance program. It is not intended to diagnose MRSA infection nor to guide or monitor treatment for MRSA infections.   Aerobic/Anaerobic Culture (surgical/deep wound)     Status: None (Preliminary result)   Collection Time: 06/04/16  4:05 PM  Result Value Ref Range Status   Specimen Description ABSCESS GALL BLADDER  Final   Special Requests Normal  Final   Gram Stain   Final    RARE WBC PRESENT, PREDOMINANTLY PMN FEW GRAM NEGATIVE RODS FEW GRAM POSITIVE COCCI IN PAIRS IN CHAINS    Culture   Final    MODERATE ENTEROCOCCUS FAECIUM FEW ESCHERICHIA COLI NO ANAEROBES ISOLATED; CULTURE IN PROGRESS FOR 5 DAYS    Report Status PENDING  Incomplete   Organism ID, Bacteria ENTEROCOCCUS FAECIUM  Final   Organism ID, Bacteria ESCHERICHIA COLI  Final      Susceptibility   Escherichia coli - MIC*    AMPICILLIN 8 SENSITIVE Sensitive     CEFAZOLIN <=4 SENSITIVE Sensitive     CEFEPIME <=1 SENSITIVE Sensitive     CEFTAZIDIME <=1 SENSITIVE Sensitive     CEFTRIAXONE <=1 SENSITIVE Sensitive     CIPROFLOXACIN <=0.25 SENSITIVE Sensitive     GENTAMICIN <=1 SENSITIVE Sensitive     IMIPENEM <=0.25 SENSITIVE Sensitive     TRIMETH/SULFA <=20 SENSITIVE Sensitive     AMPICILLIN/SULBACTAM <=2 SENSITIVE Sensitive     PIP/TAZO <=4 SENSITIVE Sensitive     Extended ESBL NEGATIVE Sensitive     * FEW ESCHERICHIA COLI   Enterococcus faecium - MIC*    AMPICILLIN <=2 SENSITIVE Sensitive     VANCOMYCIN <=0.5 SENSITIVE Sensitive     GENTAMICIN SYNERGY SENSITIVE Sensitive     *  MODERATE ENTEROCOCCUS FAECIUM  Culture, blood (routine x 2)     Status: None (Preliminary result)   Collection Time: 06/06/16  3:59 PM  Result Value Ref Range Status   Specimen Description BLOOD RIGHT HAND  Final   Special Requests IN PEDIATRIC BOTTLE 2CC  Final   Culture NO GROWTH 3 DAYS  Final   Report Status PENDING  Incomplete  Culture, blood  (routine x 2)     Status: None (Preliminary result)   Collection Time: 06/06/16  4:06 PM  Result Value Ref Range Status   Specimen Description BLOOD LEFT HAND  Final   Special Requests IN PEDIATRIC BOTTLE 3CC  Final   Culture NO GROWTH 3 DAYS  Final   Report Status PENDING  Incomplete  Culture, respiratory (NON-Expectorated)     Status: None (Preliminary result)   Collection Time: 06/09/16  3:20 AM  Result Value Ref Range Status   Specimen Description TRACHEAL ASPIRATE  Final   Special Requests NONE  Final   Gram Stain   Final    ABUNDANT WBC PRESENT, PREDOMINANTLY PMN RARE YEAST    Culture PENDING  Incomplete   Report Status PENDING  Incomplete    Coagulation Studies: No results for input(s): LABPROT, INR in the last 72 hours.  Urinalysis:  Recent Labs  06/07/16 1223  COLORURINE YELLOW  LABSPEC >1.030*  PHURINE 5.5  GLUCOSEU NEGATIVE  HGBUR LARGE*  BILIRUBINUR NEGATIVE  KETONESUR NEGATIVE  PROTEINUR 30*  NITRITE NEGATIVE  LEUKOCYTESUR TRACE*      Imaging: Dg Chest Port 1 View  Result Date: 06/09/2016 CLINICAL DATA:  Subsequent encounter. Intubated patient. Respiratory distress. EXAM: PORTABLE CHEST 1 VIEW COMPARISON:  06/08/2016 FINDINGS: Patchy airspace opacity in the left mid and lower lung is similar to the prior exam. Opacity in the right lung has mildly improved. There are no new lung opacities. There probable pleural effusions, small. No pneumothorax. Endotracheal tube is stable with its tip 2.7 cm above the carina. Nasal/orogastric tube passes below the diaphragm well into the stomach. Right internal jugular central venous line tip projects in the lower superior vena cava, also stable. IMPRESSION: 1. Mild improvement in lung aeration with decreased lung opacity in the right lung. Stable airspace opacity noted in the left lung. 2. No new abnormalities. 3. Support apparatus is stable and well positioned. Electronically Signed   By: Lajean Manes M.D.   On: 06/09/2016  07:38   Dg Chest Port 1 View  Result Date: 06/08/2016 CLINICAL DATA:  Acute onset of respiratory failure. Endotracheal tube placement. Initial encounter. EXAM: PORTABLE CHEST 1 VIEW COMPARISON:  Chest radiograph performed earlier today at 4:23 a.m. FINDINGS: The patient's endotracheal tube is seen ending 3 cm above the carina. An enteric tube is noted extending below the diaphragm. The right IJ line is noted ending about the mid to distal SVC. Vascular congestion is noted. Bilateral airspace opacification raises concern for pulmonary edema. A small right pleural effusion is noted. No pneumothorax is identified. The cardiomediastinal silhouette is borderline enlarged. No acute osseous abnormalities are identified. Thoracolumbar spinal fusion hardware is partially imaged. IMPRESSION: 1. Endotracheal tube seen ending 3 cm above the carina. 2. Vascular congestion and borderline cardiomegaly noted. Bilateral airspace opacification raises concern for pulmonary edema. Small right pleural effusion noted. Electronically Signed   By: Garald Balding M.D.   On: 06/08/2016 22:36     Medications:   . sodium chloride 10 mL/hr at 06/10/16 0600  . amiodarone 30 mg/hr (06/10/16 0553)  . bivalirudin (  ANGIOMAX) infusion 0.5 mg/mL (Non-ACS indications) 0.045 mg/kg/hr (06/10/16 0600)  . dexmedetomidine 0.2 mcg/kg/hr (06/10/16 0630)  . fentaNYL infusion INTRAVENOUS 175 mcg/hr (06/10/16 0600)   . ampicillin-sulbactam (UNASYN) IV  3 g Intravenous Q12H  . antiseptic oral rinse  7 mL Mouth Rinse Q4H  . arformoterol  15 mcg Nebulization BID  . budesonide (PULMICORT) nebulizer solution  0.5 mg Nebulization BID  . feeding supplement (VITAL HIGH PROTEIN)  1,000 mL Per Tube Q24H  . furosemide  80 mg Intravenous Q12H  . insulin aspart  0-9 Units Subcutaneous Q4H  . levothyroxine  75 mcg Per Tube Daily  . pantoprazole sodium  40 mg Per Tube BID   sodium chloride, levalbuterol, metoprolol, midazolam  Assessment/ Plan:    Acute oliguric renal failure in the setting of septic shock - creatinine appears to be increasing slowly and suspect ATN - will continue to monitor urine output and serial creatinine measurements.   Metabolic acidosis corrected   Hypokalemia  Corrected and will follow  Hypophosphatemia repleted  Acute cholangitis with polymicrobial bacteremia Continue pip/tazo  Hypervolemia  will stop IV fluids and continue diuretics IV 80mg  q 8h appears to have responded to lasix  Hypothyroidism replaced  Antiphospholipid antibody syndrome currently angiomax  Atrial fibrillation - anticoagulation  Asthmatic lung disease   No indications for dialysis this morning     LOS: 6 Latoya [name] W @TODAY @8 :17 AM

## 2016-06-11 ENCOUNTER — Inpatient Hospital Stay (HOSPITAL_COMMUNITY): Payer: Medicare Other

## 2016-06-11 LAB — HEPATIC FUNCTION PANEL
ALBUMIN: 2 g/dL — AB (ref 3.5–5.0)
ALT: 34 U/L (ref 14–54)
AST: 18 U/L (ref 15–41)
Alkaline Phosphatase: 105 U/L (ref 38–126)
BILIRUBIN TOTAL: 0.7 mg/dL (ref 0.3–1.2)
Bilirubin, Direct: 0.2 mg/dL (ref 0.1–0.5)
Indirect Bilirubin: 0.5 mg/dL (ref 0.3–0.9)
Total Protein: 5.2 g/dL — ABNORMAL LOW (ref 6.5–8.1)

## 2016-06-11 LAB — GLUCOSE, CAPILLARY
GLUCOSE-CAPILLARY: 127 mg/dL — AB (ref 65–99)
GLUCOSE-CAPILLARY: 155 mg/dL — AB (ref 65–99)
GLUCOSE-CAPILLARY: 167 mg/dL — AB (ref 65–99)
Glucose-Capillary: 137 mg/dL — ABNORMAL HIGH (ref 65–99)
Glucose-Capillary: 139 mg/dL — ABNORMAL HIGH (ref 65–99)
Glucose-Capillary: 147 mg/dL — ABNORMAL HIGH (ref 65–99)
Glucose-Capillary: 164 mg/dL — ABNORMAL HIGH (ref 65–99)

## 2016-06-11 LAB — CULTURE, BLOOD (ROUTINE X 2)
Culture: NO GROWTH
Culture: NO GROWTH

## 2016-06-11 LAB — RENAL FUNCTION PANEL
ALBUMIN: 2 g/dL — AB (ref 3.5–5.0)
ANION GAP: 15 (ref 5–15)
BUN: 99 mg/dL — ABNORMAL HIGH (ref 6–20)
CALCIUM: 8 mg/dL — AB (ref 8.9–10.3)
CO2: 28 mmol/L (ref 22–32)
CREATININE: 4 mg/dL — AB (ref 0.44–1.00)
Chloride: 103 mmol/L (ref 101–111)
GFR, EST AFRICAN AMERICAN: 12 mL/min — AB (ref >60)
GFR, EST NON AFRICAN AMERICAN: 11 mL/min — AB (ref >60)
Glucose, Bld: 149 mg/dL — ABNORMAL HIGH (ref 65–99)
PHOSPHORUS: 5.6 mg/dL — AB (ref 2.5–4.6)
Potassium: 3.2 mmol/L — ABNORMAL LOW (ref 3.5–5.1)
SODIUM: 146 mmol/L — AB (ref 135–145)

## 2016-06-11 LAB — CBC
HEMATOCRIT: 27.1 % — AB (ref 36.0–46.0)
HEMOGLOBIN: 8.9 g/dL — AB (ref 12.0–15.0)
MCH: 27.1 pg (ref 26.0–34.0)
MCHC: 32.8 g/dL (ref 30.0–36.0)
MCV: 82.6 fL (ref 78.0–100.0)
Platelets: 113 10*3/uL — ABNORMAL LOW (ref 150–400)
RBC: 3.28 MIL/uL — AB (ref 3.87–5.11)
RDW: 15 % (ref 11.5–15.5)
WBC: 18 10*3/uL — AB (ref 4.0–10.5)

## 2016-06-11 LAB — C DIFFICILE QUICK SCREEN W PCR REFLEX
C DIFFICILE (CDIFF) TOXIN: NEGATIVE
C DIFFICLE (CDIFF) ANTIGEN: NEGATIVE
C Diff interpretation: NOT DETECTED

## 2016-06-11 LAB — CULTURE, RESPIRATORY

## 2016-06-11 LAB — APTT
APTT: 67 s — AB (ref 24–36)
APTT: 66 s — AB (ref 24–36)

## 2016-06-11 LAB — CULTURE, RESPIRATORY W GRAM STAIN

## 2016-06-11 MED ORDER — FUROSEMIDE 10 MG/ML IJ SOLN
80.0000 mg | Freq: Two times a day (BID) | INTRAMUSCULAR | Status: DC
  Administered 2016-06-11 (×2): 80 mg via INTRAVENOUS
  Filled 2016-06-11 (×2): qty 8

## 2016-06-11 MED ORDER — SODIUM CHLORIDE 0.9 % IV SOLN
3.0000 g | INTRAVENOUS | Status: AC
  Administered 2016-06-11: 3 g via INTRAVENOUS
  Filled 2016-06-11: qty 3

## 2016-06-11 MED ORDER — POTASSIUM CHLORIDE 20 MEQ/15ML (10%) PO SOLN
40.0000 meq | Freq: Once | ORAL | Status: AC
  Administered 2016-06-11: 40 meq
  Filled 2016-06-11: qty 30

## 2016-06-11 NOTE — Progress Notes (Signed)
[facility] Progress Note Patient Name: Latoya [name] [facility] DOB: 1946-07-04 MRN: [id number]   Date of Service  06/11/2016  HPI/Events of Note    eICU Interventions  Hypokalemia -repleted     Intervention Category Intermediate Interventions: Electrolyte abnormality - evaluation and management  Dandra Shambaugh V. 06/11/2016, 6:24 AM

## 2016-06-11 NOTE — Progress Notes (Signed)
[place] for Bivalirudin  Indication: atrial fibrillation   Allergies  Allergen Reactions  . Hornet Venom Anaphylaxis  . Pork-Derived Products Anaphylaxis  . Chlorhexidine Itching    Reports that it is the dye in it  . Enoxaparin Sodium     REACTION: thrombocytopenia  . Heparin     REACTION: thrombocytopenia  . Lactose Intolerance (Gi)   . Other     Sensitive to dye in Betadine & Chlorohexadine   . Povidone-Iodine     Sensitivity- but if its wiped off she is able to tolerate betadine   . Indomethacin Rash  . Phenylpropanolamine Rash    Other Reaction: other reaction  . Povidone Iodine Rash    Patient Measurements: Height: 5' (152.4 cm) Weight: 186 lb 15.2 oz (84.8 kg) IBW/kg (Calculated) : 45.5  Vital Signs: Temp: 98.1 F (36.7 C) (08/21 0800) BP: 147/69 (08/21 0805) Pulse Rate: 87 (08/21 0805)  Labs:  Recent Labs  06/09/16 0405  06/10/16 0430 06/10/16 1754 06/11/16 0515  HGB 11.9*  --  9.6*  --  8.9*  HCT 35.0*  --  28.6*  --  27.1*  PLT 127*  --  103*  --  113*  APTT 58*  < > 74* 51* 67*  CREATININE 3.25*  --  3.83*  --  4.00*  < > = values in this interval not displayed.  Estimated Creatinine Clearance: 12.8 mL/min (by C-G formula based on SCr of 4 mg/dL).   Assessment: Patient is a 70 yo female who presented 8/14 with sepsis of presumed biliary origin. She was on rivaroxaban PTA for AFib with last dose 8/13PM. She also has h/o antiphospholipid syndrome w/ h/o DVT/PE. She has documented allergy to both heparin and enoxaparin. LFTs and T bili were elevated on admission, therefore not using argatroban.   APTT is therapeutic at [place]. H/H 8.9/27.1 and platelet count 113. No bleeding noted per RN. SCr remains elevated at 4.0 mg/dL. Will continue current bivalirudin rate and continue to monitor closely.   Goal of Therapy:  Goal aPTT: 50-85 seconds Monitor platelets by anticoagulation protocol: Yes   Plan:  -Continue  bivalirudin 0.05 mg / kg / hr IV infusion -Monitor aPTT every 12 hours for now -Monitor CBC, renal function  Demetrius Charity, PharmD Acute Care Pharmacy Resident  Pager: [phone] 06/11/2016

## 2016-06-11 NOTE — Progress Notes (Addendum)
Pharmacy Antibiotic Note  Latoya [name] is a 70 y.o. female admitted on 06/04/2016 with E. coli, E. faecium, K. pneumoniae  bacteremia/intra-abdominal infection.  Pharmacy has been consulted to dose Unasyn.    Patient's renal function continues to worsen, with SCr of 4.0 mg/dL and CrCL around 12.8 mL/min. She is afebrile today with WBC trending down to 18.0.   Given renal function decline, will change Unasyn dosing interval to every 24 hours. Her last dose of Unasyn will be tonight.   Plan: - Unasyn 3gm IV Q24H, set to end tonight.  - Monitor renal fxn, clinical progress  Height: 5' (152.4 cm) Weight: 186 lb 15.2 oz (84.8 kg) IBW/kg (Calculated) : 45.5  Temp (24hrs), Avg:97.8 F (36.6 C), Min:97.3 F (36.3 C), Max:98.1 F (36.7 C)   Recent Labs Lab 06/06/16 1050 06/07/16 0415 06/08/16 0432 06/08/16 1415 06/08/16 2100 06/09/16 0405 06/10/16 0430 06/11/16 0515  WBC  --  30.5* 22.8*  --   --  27.1* 20.1* 18.0*  CREATININE  --  2.30* 2.78* 2.99* 3.08* 3.25* 3.83* 4.00*  VANCOTROUGH 34*  --   --   --   --   --   --   --     Estimated Creatinine Clearance: 12.8 mL/min (by C-G formula based on SCr of 4 mg/dL).    Allergies  Allergen Reactions  . Hornet Venom Anaphylaxis  . Pork-Derived Products Anaphylaxis  . Chlorhexidine Itching    Reports that it is the dye in it  . Enoxaparin Sodium     REACTION: thrombocytopenia  . Heparin     REACTION: thrombocytopenia  . Lactose Intolerance (Gi)   . Other     Sensitive to dye in Betadine & Chlorohexadine   . Povidone-Iodine     Sensitivity- but if its wiped off she is able to tolerate betadine   . Indomethacin Rash  . Phenylpropanolamine Rash    Other Reaction: other reaction  . Povidone Iodine Rash    Antimicrobials this admission: 8/14 Zosyn >> 8/17 8/14 Vanc >> 8/16 8/17 Unasyn >> (8/21)  Dose adjustments this admission: Unasyn adjusted for renal function.   Microbiology results: 8/16 BCx x2: NGTD  8/14 Gall  bladder abscess: enterococcus (pan-S) 8/14 0045 BCx: E coli (pan-S), Kleb pneumo (R-amp, all else-S), Enterococcus (pan-S) 8/14 0054 BCx: E coli, GPCs - reincubated  8/14 UCx: insignificant  8/14 MRSA PCR: negative   Demetrius Charity, PharmD Acute Care Pharmacy Resident  Pager: [phone] 06/11/2016   I have reviewed and agree with Dr Sonnie Alamo assessment and plan.   Sloan Leiter, PharmD, BCPS Clinical Pharmacist [phone] 06/11/2016, 11:43 AM

## 2016-06-11 NOTE — Progress Notes (Signed)
Progress Note: General Surgery Service   Subjective: Large brown BM this morning  Objective: Vital signs in last 24 hours: Temp:  [97.3 F (36.3 C)-98.1 F (36.7 C)] 97.9 F (36.6 C) (08/21 0630) Pulse Rate:  [68-94] 87 (08/21 0805) Resp:  [14-24] 21 (08/21 0805) BP: (97-157)/(53-114) 147/69 (08/21 0805) SpO2:  [97 %-100 %] 97 % (08/21 0805) FiO2 (%):  [40 %-50 %] 40 % (08/21 0805) Weight:  [84.8 kg (186 lb 15.2 oz)] 84.8 kg (186 lb 15.2 oz) (08/21 0500) Last BM Date: 06/09/16  Intake/Output from previous day: 08/20 0701 - 08/21 0700 In: 2798 [I.V.:978; NG/GT:1620; IV Piggyback:200] Out: 2765 [Urine:2460; Drains:305] Intake/Output this shift: No intake/output data recorded.  Abd: nondistended, RUQ drain with bilous drainage   Lab Results: CBC   Recent Labs  06/10/16 0430 06/11/16 0515  WBC 20.1* 18.0*  HGB 9.6* 8.9*  HCT 28.6* 27.1*  PLT 103* 113*   BMET  Recent Labs  06/10/16 0430 06/11/16 0515  NA 145 146*  K 3.6 3.2*  CL 102 103  CO2 27 28  GLUCOSE 174* 149*  BUN 84* 99*  CREATININE 3.83* 4.00*  CALCIUM 7.8* 8.0*   PT/INR No results for input(s): LABPROT, INR in the last 72 hours. ABG  Recent Labs  06/08/16 2320 06/09/16 0500  PHART 7.393 7.378  HCO3 22.9 24.4*    Studies/Results:  Anti-infectives: Anti-infectives    Start     Dose/Rate Route Frequency Ordered Stop   06/07/16 2200  Ampicillin-Sulbactam (UNASYN) 3 g in sodium chloride 0.9 % 100 mL IVPB     3 g 100 mL/hr over 60 Minutes Intravenous Every 12 hours 06/07/16 1514 06/11/16 2359   06/05/16 0030  vancomycin (VANCOCIN) IVPB 750 mg/150 ml premix  Status:  Discontinued     750 mg 150 mL/hr over 60 Minutes Intravenous Every 12 hours 06/04/16 1135 06/06/16 0937   06/04/16 1300  piperacillin-tazobactam (ZOSYN) IVPB 3.375 g  Status:  Discontinued     3.375 g 12.5 mL/hr over 240 Minutes Intravenous Every 8 hours 06/04/16 0423 06/07/16 1504   06/04/16 1200  vancomycin (VANCOCIN)  1,500 mg in sodium chloride 0.9 % 500 mL IVPB     1,500 mg 250 mL/hr over 120 Minutes Intravenous  Once 06/04/16 1135 06/04/16 1412   06/04/16 0345  piperacillin-tazobactam (ZOSYN) IVPB 3.375 g     3.375 g 100 mL/hr over 30 Minutes Intravenous  Once 06/04/16 0344 06/04/16 0456      Medications: Scheduled Meds: . amiodarone  100 mg Oral BID  . ampicillin-sulbactam (UNASYN) IV  3 g Intravenous Q12H  . antiseptic oral rinse  7 mL Mouth Rinse Q4H  . arformoterol  15 mcg Nebulization BID  . budesonide (PULMICORT) nebulizer solution  0.5 mg Nebulization BID  . feeding supplement (VITAL HIGH PROTEIN)  1,000 mL Per Tube Q24H  . furosemide  80 mg Intravenous Q12H  . insulin aspart  0-9 Units Subcutaneous Q4H  . levothyroxine  75 mcg Per Tube Daily  . pantoprazole sodium  40 mg Per Tube BID   Continuous Infusions: . sodium chloride 10 mL/hr at 06/10/16 0600  . bivalirudin (ANGIOMAX) infusion 0.5 mg/mL (Non-ACS indications) 0.05 mg/kg/hr (06/10/16 2000)  . dexmedetomidine 0.4 mcg/kg/hr (06/11/16 0219)  . fentaNYL infusion INTRAVENOUS 175 mcg/hr (06/11/16 0033)   PRN Meds:.sodium chloride, levalbuterol, metoprolol, midazolam  Assessment/Plan: Patient Active Problem List   Diagnosis Date Noted  . Acute respiratory failure ([place])   . Enterococcal bacteremia 06/06/2016  . Bacteremia due  to Klebsiella pneumoniae 06/06/2016  . Bacteremia due to Escherichia coli 06/06/2016  . Septic shock ([place])   . Acute respiratory failure with hypoxia ([place])   . Ascending cholangitis 06/04/2016  . Sepsis ([place]) 06/04/2016  . Hemangioma of liver 06/04/2016  . Severe sepsis ([place]) 06/04/2016  . Cervical pseudoarthrosis ([place]) 08/31/2014  . Anaphylaxis, mild, due to wasp envenomation 04/12/2013  . Chest pain, atypical 04/11/2013  . PAD (peripheral artery disease) ([place]) 06/20/2011  . ANEMIA, IRON DEFICIENCY, MICROCYTIC 06/01/2010  . Primary hypercoagulable state ([place]) 06/01/2010  . BACK PAIN, CHRONIC  06/01/2010  . ABDOMINAL PAIN -GENERALIZED 06/01/2010  . CAROTID ARTERY DISEASE 05/25/2010  . CAD, NATIVE VESSEL 11/15/2009  . MURMUR 11/15/2009  . CAROTID BRUIT, LEFT 11/15/2009  . HYPERTENSION 03/18/2009  . History of pulmonary embolism 03/18/2009  . PAROXYSMAL ATRIAL FIBRILLATION 03/18/2009  . DVT 03/18/2009  . Asthma 03/18/2009  . GERD 03/18/2009  . SPONDYLOSIS, LUMBAR 03/18/2009  . HYPERLIPIDEMIA 11/29/2008  . CORONARY HEART DISEASE 11/29/2008   s/p  Perc cholecystostomy tube for concern of cholangitis, now with normalized LFTs and decreasing leukocytosis -tolerating tube feeds -still with multi system issues from sepsis -off pressors  -recommend continue per cholecystostomy tube -does not appear to be a biliary obstruction based on normal coloration of stool and normal bilirubins -no further inpatient recommendations from general surgery, recommend follow up with Dr. Brantley Stage 3 weeks after discharge vs fluoro study of perc cholecystostomy tube 4 weeks after placement if still in hospital -please contact general surgery for further questions or new concerns  Gurney Maxin, M.D. [facility] Pg: [phone]   LOS: 7 days   Mickeal Skinner, MD Pg# [phone] [facility]

## 2016-06-11 NOTE — Progress Notes (Signed)
Pts LLE is cold to touch, pulses dopplerable. Dr Vaughan Browner made aware. No new orders.

## 2016-06-11 NOTE — Progress Notes (Signed)
S: intubated O:BP (!) 120/57   Pulse 73   Temp 97.7 F (36.5 C)   Resp 15   Ht 5' (1.524 m)   Wt 84.8 kg (186 lb 15.2 oz)   SpO2 99%   BMI 36.51 kg/m   Intake/Output Summary (Last 24 hours) at 06/11/16 0728 Last data filed at 06/11/16 0500  Gross per 24 hour  Intake           2718.7 ml  Output             1665 ml  Net           1053.7 ml   Weight change: 1.4 kg (3 lb 1.4 oz) Gen: Intubated but arousable CVS: RRR Resp:Clear ant Abd: + BS Soft, mild upper quadrant tenderness Ext: tr edema NEURO: Tries to mouth words, follows commands   . amiodarone  100 mg Oral BID  . ampicillin-sulbactam (UNASYN) IV  3 g Intravenous Q12H  . antiseptic oral rinse  7 mL Mouth Rinse Q4H  . arformoterol  15 mcg Nebulization BID  . budesonide (PULMICORT) nebulizer solution  0.5 mg Nebulization BID  . feeding supplement (VITAL HIGH PROTEIN)  1,000 mL Per Tube Q24H  . furosemide  80 mg Intravenous Q12H  . insulin aspart  0-9 Units Subcutaneous Q4H  . levothyroxine  75 mcg Per Tube Daily  . pantoprazole sodium  40 mg Per Tube BID   Dg Chest Portable 1 View  Result Date: 06/11/2016 CLINICAL DATA:  Shortness of breath EXAM: PORTABLE CHEST 1 VIEW COMPARISON:  Chest radiograph 06/10/2016 FINDINGS: Endotracheal tube tip appears to be just above the carina, though the carina is difficult to visualize. Enteric tube courses beyond the field of view. Multilevel spinal fixation hardware is again noted. Unchanged position of right IJ central venous catheter. There is improved lung aeration, particularly at the left base. Small left pleural effusion again noted. No new areas of consolidation. IMPRESSION: 1. Improved lung aeration, particularly at the left lung base. No new areas of consolidation. 2. Endotracheal tube tip appears to be just above the level of the carina. However, the carina is difficult to visualize on this examination. Electronically Signed   By: Ulyses Jarred M.D.   On: 06/11/2016 06:16   Dg  Chest Portable 1 View  Result Date: 06/10/2016 CLINICAL DATA:  Respiratory failure. EXAM: PORTABLE CHEST 1 VIEW COMPARISON:  1 day prior FINDINGS: Patient rotated right. Endotracheal tube terminates 2.6 cm above carina.Right internal jugular line tip at mid SVC. Feeding tube extends beyond the inferior aspect of the film. Normal heart size. Right hemidiaphragm elevation. No definite pleural fluid. No pneumothorax. Left worse than right airspace disease. not significantly changed. IMPRESSION: No change in left greater than right airspace disease which could represent pneumonia and/or pulmonary edema. Electronically Signed   By: Abigail Miyamoto M.D.   On: 06/10/2016 09:26   BMET    Component Value Date/Time   NA 146 (H) 06/11/2016 0515   K 3.2 (L) 06/11/2016 0515   CL 103 06/11/2016 0515   CO2 28 06/11/2016 0515   GLUCOSE 149 (H) 06/11/2016 0515   BUN 99 (H) 06/11/2016 0515   CREATININE 4.00 (H) 06/11/2016 0515   CREATININE 0.50 02/12/2013 1831   CALCIUM 8.0 (L) 06/11/2016 0515   GFRNONAA 11 (L) 06/11/2016 0515   GFRAA 12 (L) 06/11/2016 0515   CBC    Component Value Date/Time   WBC 18.0 (H) 06/11/2016 0515   RBC 3.28 (L) 06/11/2016 0515  HGB 8.9 (L) 06/11/2016 0515   HCT 27.1 (L) 06/11/2016 0515   PLT 113 (L) 06/11/2016 0515   MCV 82.6 06/11/2016 0515   MCV 85.6 12/30/2014 1756   MCH 27.1 06/11/2016 0515   MCHC 32.8 06/11/2016 0515   RDW 15.0 06/11/2016 0515   LYMPHSABS 1.8 07/22/2014 1624   MONOABS 0.4 07/22/2014 1624   EOSABS 0.1 07/22/2014 1624   BASOSABS 0.0 07/22/2014 1624     Assessment:  1.  ARF secondary to Gm neg sepsis, hypotension and IV contrast, non oliguric.  Rate of rise of Scr lessening 2. Enterococcal/E coli sepsis probably secondary to cholecystitis 3. Hypokalemia, SP replacement  Plan: 1. Replace K as you have done 2. Cont with lasix BID 3. Daily Scr.  Anticipate renal recovery in near future   Ayjah Show T

## 2016-06-11 NOTE — Progress Notes (Signed)
[place] for Bivalirudin  Indication: atrial fibrillation   Allergies  Allergen Reactions  . Hornet Venom Anaphylaxis  . Pork-Derived Products Anaphylaxis  . Chlorhexidine Itching    Reports that it is the dye in it  . Enoxaparin Sodium     REACTION: thrombocytopenia  . Heparin     REACTION: thrombocytopenia  . Lactose Intolerance (Gi)   . Other     Sensitive to dye in Betadine & Chlorohexadine   . Povidone-Iodine     Sensitivity- but if its wiped off she is able to tolerate betadine   . Indomethacin Rash  . Phenylpropanolamine Rash    Other Reaction: other reaction  . Povidone Iodine Rash    Patient Measurements: Height: 5' (152.4 cm) Weight: 186 lb 15.2 oz (84.8 kg) IBW/kg (Calculated) : 45.5  Vital Signs: Temp: 98.7 F (37.1 C) (08/21 1541) Temp Source: Oral (08/21 1541) BP: 147/85 (08/21 1800) Pulse Rate: 106 (08/21 1800)  Labs:  Recent Labs  06/09/16 0405  06/10/16 0430 06/10/16 1754 06/11/16 0515 06/11/16 1600  HGB 11.9*  --  9.6*  --  8.9*  --   HCT 35.0*  --  28.6*  --  27.1*  --   PLT 127*  --  103*  --  113*  --   APTT 58*  < > 74* 51* 67* 66*  CREATININE 3.25*  --  3.83*  --  4.00*  --   < > = values in this interval not displayed.  Estimated Creatinine Clearance: 12.8 mL/min (by C-G formula based on SCr of 4 mg/dL).   Assessment: Patient is a 71 yo female who presented 8/14 with sepsis of presumed biliary origin. She was on rivaroxaban PTA for AFib with last dose 8/13PM. She also has h/o antiphospholipid syndrome w/ h/o DVT/PE. She has documented allergy to both heparin and enoxaparin. LFTs and T bili were elevated on admission, therefore not using argatroban.   APTT is therapeutic at 66s. Will continue current bivalirudin rate and continue to monitor closely.   Goal of Therapy:  Goal aPTT: 50-85 seconds Monitor platelets by anticoagulation protocol: Yes   Plan:  -Continue bivalirudin 0.05 mg / kg /  hr IV infusion -Monitor aPTT every 12 hours for now -Monitor CBC, renal function  Maryanna Shape, PharmD, BCPS  Clinical Pharmacist  Pager: [phone]   06/11/2016

## 2016-06-11 NOTE — Care Management Important Message (Signed)
Important Message  Patient Details  Name: Latoya [name] MRN: [id number] Date of Birth: [birth year]   Medicare Important Message Given:  Yes    Dorlisa Savino Abena 06/11/2016, 11:04 AM

## 2016-06-11 NOTE — Progress Notes (Signed)
Patient ID: Latoya [name], female   DOB: 16-Jun-1946, 70 y.o.   MRN: [id number]          [facility] for Infectious Disease    Date of Admission:  06/04/2016   Total days of antibiotics 9        Day 5 on sulbactam  She remains in the ICU on the ventilator but is now afebrile and repeat blood cultures were negative. I agree that her polymicrobial bacteremia complicating cholangitis has now been treated sufficiently. She will complete ampicillin sulbactam tonight. I will sign off now but please call if I can be of further assistance.      Michel Bickers, MD [facility] for Infectious [facility] [phone] pager   [phone] cell 10/25/2015, 1:32 PM

## 2016-06-11 NOTE — Progress Notes (Signed)
PULMONARY / CRITICAL CARE MEDICINE   Name: Latoya [name] MRN: [id number] DOB: 1945/12/05    ADMISSION DATE:  06/04/2016  CHIEF COMPLAINT:  Abdominal Pain  HISTORY OF PRESENT ILLNESS:   Latoya [name] is a 70 y/o woman with MMP, most notable for antiphospholipid syndrome on AC (hx of one provoked DVT), as well as CAD and asthma who presented to the ED with sudden onset of chills and abdominal pain. In the ED her clinical status declined, becoming less responsive, more tachycardic and more tachypneic. Imaging studies suggested an enlarged gallbladder.  SUBJECTIVE:  No acute events.  VITAL SIGNS: BP (!) 147/69   Pulse 87   Temp 98.1 F (36.7 C)   Resp (!) 21   Ht 5' (1.524 m)   Wt 186 lb 15.2 oz (84.8 kg)   SpO2 97%   BMI 36.51 kg/m    HEMODYNAMICS: CVP:  [6 mmHg-16 mmHg] 6 mmHg  VENTILATOR SETTINGS: Vent Mode: PRVC FiO2 (%):  [40 %-50 %] 40 % Set Rate:  [15 bmp] 15 bmp Vt Set:  [500 mL] 500 mL PEEP:  [10 cmH20] 10 cmH20 Plateau Pressure:  [21 cmH20-22 cmH20] 22 cmH20  INTAKE / OUTPUT: I/O last 3 completed shifts: In: 3483.2 [I.V.:1633.2; NG/GT:1650; IV MPNTIRWER:154] Out: 0086 [Urine:3560; Drains:485]  PHYSICAL EXAMINATION: General:  Elderly woman, No distress  Neuro:  Awake, Follows commands. No focal deficits HEENT:  No JVD, thyromegaly Cardiovascular:  Tachycardic, Nl S1/S2, No MRG Lungs:  resps even non labored on vent, diminished throughout, few bibasilar crackles  Abdomen:  Obese, NT, ND and +BS Musculoskeletal:  No joint swelling Skin:  No visible rashes  LABS:  BMET  Recent Labs Lab 06/09/16 0405 06/10/16 0430 06/11/16 0515  NA 143 145 146*  K 3.4* 3.6 3.2*  CL 102 102 103  CO2 '26 27 28  '$ BUN 70* 84* 99*  CREATININE 3.25* 3.83* 4.00*  GLUCOSE 133* 174* 149*   Electrolytes  Recent Labs Lab 06/07/16 0415 06/08/16 0432  06/09/16 0405 06/10/16 0430 06/11/16 0515  CALCIUM 8.0* 7.9*  < > 7.9* 7.8* 8.0*  MG 3.3* 2.9*  --  2.7*  --   --   PHOS  4.0 2.4*  < > 3.5 3.9 5.6*  < > = values in this interval not displayed. CBC  Recent Labs Lab 06/09/16 0405 06/10/16 0430 06/11/16 0515  WBC 27.1* 20.1* 18.0*  HGB 11.9* 9.6* 8.9*  HCT 35.0* 28.6* 27.1*  PLT 127* 103* 113*   Coag's  Recent Labs Lab 06/04/16 1314  06/10/16 0430 06/10/16 1754 06/11/16 0515  APTT  --   < > 74* 51* 67*  INR 1.58  --   --   --   --   < > = values in this interval not displayed. Sepsis Markers No results for input(s): LATICACIDVEN, PROCALCITON, O2SATVEN in the last 168 hours. ABG  Recent Labs Lab 06/08/16 0515 06/08/16 2320 06/09/16 0500  PHART 7.436 7.393 7.378  PCO2ART 33.6* 38.3 42.4  PO2ART 84.6 84.6 98.9   Liver Enzymes  Recent Labs Lab 06/06/16 0327  06/08/16 2100 06/10/16 0430 06/11/16 0515  AST 115*  --   --   --  18  ALT 218*  --   --   --  34  ALKPHOS 112  --   --   --  105  BILITOT 2.8*  --   --   --  0.7  ALBUMIN 2.5*  < > 2.4* 1.8* 2.0*  2.0*  < > =  values in this interval not displayed. Cardiac Enzymes No results for input(s): TROPONINI, PROBNP in the last 168 hours. Glucose  Recent Labs Lab 06/10/16 1205 06/10/16 1611 06/10/16 2025 06/11/16 0017 06/11/16 0423 06/11/16 0805  GLUCAP 164* 139* 154* 127* 137* 147*   Imaging Dg Chest Portable 1 View  Result Date: 06/11/2016 CLINICAL DATA:  Shortness of breath EXAM: PORTABLE CHEST 1 VIEW COMPARISON:  Chest radiograph 06/10/2016 FINDINGS: Endotracheal tube tip appears to be just above the carina, though the carina is difficult to visualize. Enteric tube courses beyond the field of view. Multilevel spinal fixation hardware is again noted. Unchanged position of right IJ central venous catheter. There is improved lung aeration, particularly at the left base. Small left pleural effusion again noted. No new areas of consolidation. IMPRESSION: 1. Improved lung aeration, particularly at the left lung base. No new areas of consolidation. 2. Endotracheal tube tip appears  to be just above the level of the carina. However, the carina is difficult to visualize on this examination. Electronically Signed   By: Ulyses Jarred M.D.   On: 06/11/2016 06:16     STUDIES:  CTA ABD with enlarged galbladder and enlarging hepatic cyst. RUQ [place] noted  CULTURES: Blood 8/14 >>E. Coli and enterococcous and klebsiella. Urine 8/14 >>NTD. Abscess 8/14>>>E.Coli and enterococcus (both pan sens)   ANTIBIOTICS: Vanc 8/14 >> 8/16 Zosyn 8/14 >>8/16 Unasyn 8/16>>> planned stop date 8/21  SIGNIFICANT EVENTS: Intubated in ED after initial evaluation IR perc drain 8/14  LINES/TUBES: ETT 7.5 mm 8/14>>>8/18, 8/18>>> Foley 8/14>>> R IJ TLC 8/14>>> Gall bladder drain (IR) 8/14>>>  DISCUSSION: 70 y/o woman with respiratory failure, severe sepsis of presumed biliary origin and AKI  ASSESSMENT / PLAN:  PULMONARY A: Acute hypoxic respiratory failure with pulmonary edema and possible aspiration. Moderate persistent asthma P:   Vent support - 8cc/kg, peep 10  Daily SBT  Lasix as below  budesonide/brovana with prn xopenex  CARDIOVASCULAR A:  CAD. HTN. Evolving septic shock - improved.  A-fib with RVR. P:  Amio PO lopressor IV prn for HR > 125 Diuresis as able  May need low dose pressor to allow further diuresis   RENAL A:   Lactic Acidosis Acute oliguric renal failure  - oliguric in setting septic shock - Scr still rising slowly but UOP improving with lasix  Hypokalemia  P:   Renal following  KVO IVF. Continue lasix '80mg'$  IV q12. Will discuss with renal if we want to hold further diuresis as Cr is increasing. F/u chem  No acute indication for dialysis and family does not want long term dialysis   GASTROINTESTINAL A:   Abnormal LFTs Probable cholangitis P:   Drain placed in by IR. Post pyloric cor-trak. TF per nutrition. No MRCP due to large burden of metallic objects in the patient's body  ?further intervention needed - surgery following peripherally    HEMATOLOGIC A:   Hx of Antiphospholipid Ab Syndrome Hx of provoked DVT On rivaroxaban Hx of allergy to Heparin products P:  Bivalirudin per pharmacy  CBC in AM. Monitor coags.  INFECTIOUS A:   Severe sepsis, probable biliary origin P:   Continue abx as above. To end today after 10 days PTC per IR.   ENDOCRINE A:   Hypothyroidism P:   Cont home syndroid. Check TSH  NEUROLOGIC A:   Need for sedation P:   RASS goal: -1 Precedex, Fentanyl drip.  FAMILY  - Updates: no family available 8/19 - Inter-disciplinary family meet or Palliative Care  meeting due by:  8/21.  Critical care time- 35 mins.  Marshell Garfinkel MD [facility] Pager [phone] If no answer or after 3pm call: [phone] 06/11/2016, 9:55 AM

## 2016-06-12 ENCOUNTER — Inpatient Hospital Stay (HOSPITAL_COMMUNITY): Payer: Medicare Other

## 2016-06-12 LAB — GLUCOSE, CAPILLARY
GLUCOSE-CAPILLARY: 152 mg/dL — AB (ref 65–99)
GLUCOSE-CAPILLARY: 182 mg/dL — AB (ref 65–99)
Glucose-Capillary: 132 mg/dL — ABNORMAL HIGH (ref 65–99)
Glucose-Capillary: 152 mg/dL — ABNORMAL HIGH (ref 65–99)
Glucose-Capillary: 149 mg/dL — ABNORMAL HIGH (ref 65–99)

## 2016-06-12 LAB — RENAL FUNCTION PANEL
Albumin: 2.1 g/dL — ABNORMAL LOW (ref 3.5–5.0)
Anion gap: 15 (ref 5–15)
BUN: 110 mg/dL — ABNORMAL HIGH (ref 6–20)
CHLORIDE: 103 mmol/L (ref 101–111)
CO2: 30 mmol/L (ref 22–32)
CREATININE: 4.16 mg/dL — AB (ref 0.44–1.00)
Calcium: 8.3 mg/dL — ABNORMAL LOW (ref 8.9–10.3)
GFR, EST AFRICAN AMERICAN: 12 mL/min — AB (ref >60)
GFR, EST NON AFRICAN AMERICAN: 10 mL/min — AB (ref >60)
Glucose, Bld: 142 mg/dL — ABNORMAL HIGH (ref 65–99)
POTASSIUM: 3.4 mmol/L — AB (ref 3.5–5.1)
Phosphorus: 6.3 mg/dL — ABNORMAL HIGH (ref 2.5–4.6)
Sodium: 148 mmol/L — ABNORMAL HIGH (ref 135–145)

## 2016-06-12 LAB — BLOOD GAS, ARTERIAL
ACID-BASE EXCESS: 4.6 mmol/L — AB (ref 0.0–2.0)
BICARBONATE: 28.7 meq/L — AB (ref 20.0–24.0)
Drawn by: 419771
FIO2: 40
LHR: 15 {breaths}/min
O2 SAT: 98.6 %
PATIENT TEMPERATURE: 98.6
PCO2 ART: 43.4 mmHg (ref 35.0–45.0)
PEEP/CPAP: 5 cmH2O
PH ART: 7.435 (ref 7.350–7.450)
TCO2: 30 mmol/L (ref 0–100)
VT: 500 mL
pO2, Arterial: 131 mmHg — ABNORMAL HIGH (ref 80.0–100.0)

## 2016-06-12 LAB — CBC
HCT: 28 % — ABNORMAL LOW (ref 36.0–46.0)
HEMOGLOBIN: 9.3 g/dL — AB (ref 12.0–15.0)
MCH: 27.7 pg (ref 26.0–34.0)
MCHC: 33.2 g/dL (ref 30.0–36.0)
MCV: 83.3 fL (ref 78.0–100.0)
Platelets: 145 10*3/uL — ABNORMAL LOW (ref 150–400)
RBC: 3.36 MIL/uL — AB (ref 3.87–5.11)
RDW: 15.1 % (ref 11.5–15.5)
WBC: 13.7 10*3/uL — ABNORMAL HIGH (ref 4.0–10.5)

## 2016-06-12 LAB — APTT: APTT: 69 s — AB (ref 24–36)

## 2016-06-12 MED ORDER — POTASSIUM CHLORIDE 20 MEQ/15ML (10%) PO SOLN
20.0000 meq | Freq: Two times a day (BID) | ORAL | Status: AC
  Administered 2016-06-12 (×2): 20 meq via ORAL
  Filled 2016-06-12 (×2): qty 15

## 2016-06-12 MED ORDER — ADULT MULTIVITAMIN LIQUID CH
15.0000 mL | Freq: Every day | ORAL | Status: DC
  Administered 2016-06-13: 15 mL via ORAL
  Filled 2016-06-12 (×3): qty 15

## 2016-06-12 NOTE — Progress Notes (Signed)
Nutrition Follow-up  DOCUMENTATION CODES:   Obesity unspecified  INTERVENTION:  Continue TF via OGT with Vital High Protein at goal rate of 45 ml/h (1080 ml per day) to provide 1080 kcals, 95 gm protein, 907 ml free water daily.   Initiate liquid multivitamin/mineral as goal TF rate does not meet 100% RDIs.   NUTRITION DIAGNOSIS:   Inadequate oral intake related to inability to eat as evidenced by NPO status.  Ongoing.  GOAL:   Provide needs based on ASPEN/SCCM guidelines  Meeting with TF infusion.  MONITOR:   Vent status, Labs, Weight trends, TF tolerance, Skin, I & O's  REASON FOR ASSESSMENT:   Consult Assessment of nutrition requirement/status  ASSESSMENT:   30 yof who has multiple medical problems including apl syndrome on xarelto and cad presented to er with sudden chills and what edp described as epigastric pain.  She became less responsive in er and eventually was intubated. She underwent ruq [place] that shows really nl gb without stones and small amount fluid and ct that shows distended gb with some fluid.  She also appears to have right hepatic hemangioma.  Pt has ARF secondary to gram negative sepsis (due to cholecystitis). Also has acute kidney injury. Pt self extubated afternoon of 8/18; tolerated a few hours then required reintubation. She is now afebrile and repeat blood cultures are negative. Per Goals of Care discussion note, plan is to attempt extubation soon.   Measured weight continues to fluctuate and is now 81.7 kg (9.1 kg higher than on 8/14). Fluctuation could be related to acute kidney injury and holding of lasix due to rising Cr and Na.  Patient is currently intubated on ventilator support MV: 8.2 L/min Temp (24hrs), Avg:98.2 F (36.8 C), Min:97.3 F (36.3 C), Max:98.8 F (37.1 C)   Medications reviewed and include: Novolog sliding scale Q4hrs, pantoprazole, potassium chloride 20 mEq BID, precedex gtt, fentanyl gtt.  Labs reviewed: CBG 132-167  past 24 hrs, Sodium 148, Potassium 3.4, Phosphorus 6.3.  Discussed plan with RN.  Diet Order:   NPO  Skin:  Wound (see comment) (MSAD abdomen, groin)  Last BM:  06/12/2016  Height:   Ht Readings from Last 1 Encounters:  06/04/16 5' (1.524 m)    Weight:   Wt Readings from Last 1 Encounters:  06/12/16 180 lb 1.9 oz (81.7 kg)    Ideal Body Weight:  45.5 kg  BMI:  Body mass index is 35.18 kg/m.  Estimated Nutritional Needs:   Kcal:  [id number]  Protein:  >/91 grams  Fluid:  per MD  EDUCATION NEEDS:   No education needs identified at this time  Willey Blade, MS, RD, LDN

## 2016-06-12 NOTE — Plan of Care (Signed)
  Interdisciplinary Goals of Care Family Meeting   Date carried out:: 06/12/2016  Location of the meeting: Bedside  Member's involved: Physician, Bedside Registered Nurse and Family Member or next of kin, Husband  Durable Power of Attorney or acting medical decision maker: Husband    Discussion: We discussed goals of care for [name] .  We discussed Latoya [name] clinical course and status today. We are hopeful of extubation soon. She will remain limited code with DNR. However he is OK for temporary reintubation if she fails.   Code status: Limited Code or DNR with short term  Disposition: Continue current acute care  Time spent for the meeting: 10 mins  Gwen Edler 06/12/2016, 10:54 AM

## 2016-06-12 NOTE — Progress Notes (Signed)
Patient ID: Latoya [name], female   DOB: [birth year], 70 y.o.   MRN: [id number] Social visit with mutual patient.  Sleeping soundly.   Verdis Prime RN BSN Neurosurgery

## 2016-06-12 NOTE — Progress Notes (Signed)
[name] for Bivalirudin  Indication: atrial fibrillation   Allergies  Allergen Reactions  . Hornet Venom Anaphylaxis  . Pork-Derived Products Anaphylaxis  . Chlorhexidine Itching    Reports that it is the dye in it  . Enoxaparin Sodium     REACTION: thrombocytopenia  . Heparin     REACTION: thrombocytopenia  . Lactose Intolerance (Gi)   . Other     Sensitive to dye in Betadine & Chlorohexadine   . Povidone-Iodine     Sensitivity- but if its wiped off she is able to tolerate betadine   . Indomethacin Rash  . Phenylpropanolamine Rash    Other Reaction: other reaction  . Povidone Iodine Rash    Patient Measurements: Height: 5' (152.4 cm) Weight: 180 lb 1.9 oz (81.7 kg) IBW/kg (Calculated) : 45.5  Vital Signs: Temp: 98.8 F (37.1 C) (08/22 0815) Temp Source: Oral (08/22 0815) BP: 136/78 (08/22 1030) Pulse Rate: 93 (08/22 1030)  Labs:  Recent Labs  06/10/16 0430  06/11/16 0515 06/11/16 1600 06/12/16 0345  HGB 9.6*  --  8.9*  --  9.3*  HCT 28.6*  --  27.1*  --  28.0*  PLT 103*  --  113*  --  145*  APTT 74*  < > 67* 66* 69*  CREATININE 3.83*  --  4.00*  --  4.16*  < > = values in this interval not displayed.  Estimated Creatinine Clearance: 12.1 mL/min (by C-G formula based on SCr of 4.16 mg/dL).   Assessment: Patient is a 70 yo female who presented 8/14 with sepsis of presumed biliary origin. She was on rivaroxaban PTA for AFib with last dose 8/13PM. She also has h/o antiphospholipid syndrome w/ h/o DVT/PE. She has documented allergy to both heparin and enoxaparin. LFTs and T bili were elevated on admission, therefore not using argatroban.   APTT is therapeutic at 69s. Will continue current bivalirudin rate and continue to monitor. Given that patient has been stable at this rate, will go to daily aPTT levels.     Note: Because patient's bivalirudin drip is running at a slow rate, she has not been using a full bag before it  expires (has 24 hour expiration).   Goal of Therapy:  Goal aPTT: 50-85 seconds Monitor platelets by anticoagulation protocol: Yes   Plan:  -Continue bivalirudin 0.05 mg / kg / hr IV infusion -Monitor aPTT every 24 hours -Monitor CBC, renal function -F/U ability to transition back to oral anticoagulation when appropriate   Demetrius Charity, PharmD Acute Care Pharmacy Resident  Pager: [phone] 06/12/2016

## 2016-06-12 NOTE — Progress Notes (Signed)
PULMONARY / CRITICAL CARE MEDICINE   Name: Latoya [name] MRN: [id number] DOB: [birth year]    ADMISSION DATE:  06/04/2016  CHIEF COMPLAINT:  Abdominal Pain  HISTORY OF PRESENT ILLNESS:   Latoya [name] is a 70 y/o woman with MMP, most notable for antiphospholipid syndrome on AC (hx of one provoked DVT), as well as CAD and asthma who presented to the ED with sudden onset of chills and abdominal pain. In the ED her clinical status declined, becoming less responsive, more tachycardic and more tachypneic. Imaging studies suggested an enlarged gallbladder.  SUBJECTIVE:  No acute events.  VITAL SIGNS: BP 136/78   Pulse 93   Temp 98.8 F (37.1 C) (Oral)   Resp 19   Ht 5' (1.524 m)   Wt 180 lb 1.9 oz (81.7 kg)   SpO2 99%   BMI 35.18 kg/m    HEMODYNAMICS: CVP:  [7 mmHg-15 mmHg] 7 mmHg  VENTILATOR SETTINGS: Vent Mode: CPAP;PSV FiO2 (%):  [40 %] 40 % Set Rate:  [15 bmp] 15 bmp Vt Set:  [500 mL] 500 mL PEEP:  [5 cmH20-10 cmH20] 5 cmH20 Pressure Support:  [5 cmH20-10 cmH20] 10 cmH20 Plateau Pressure:  [22 PPI95-18 cmH20] 22 cmH20  INTAKE / OUTPUT: I/O last 3 completed shifts: In: 3562.8 [I.V.:1577.8; NG/GT:1785; IV [id number]:160] Out: 1093 [ATFTD:3220; Emesis/NG output:400; Drains:400; Stool:125]  PHYSICAL EXAMINATION: General:  Elderly woman, No distress  Neuro:  Awake, Follows commands. No focal deficits HEENT:  No JVD, thyromegaly Cardiovascular:  Tachycardic, Nl S1/S2, No MRG Lungs:  resps even non labored on vent, diminished throughout, few bibasilar crackles  Abdomen:  Obese, NT, ND and +BS Musculoskeletal:  No joint swelling Skin:  No visible rashes  LABS:  BMET  Recent Labs Lab 06/10/16 0430 06/11/16 0515 06/12/16 0345  NA 145 146* 148*  K 3.6 3.2* 3.4*  CL 102 103 103  CO2 _0 BUN 84* 99* 110*  CREATININE 3.83* 4.00* 4.16*  GLUCOSE 174* 149* 142*   Electrolytes  Recent Labs Lab 06/07/16 0415 06/08/16 0432  06/09/16 0405 06/10/16 0430  06/11/16 0515 06/12/16 0345  CALCIUM 8.0* 7.9*  < > 7.9* 7.8* 8.0* 8.3*  MG 3.3* 2.9*  --  2.7*  --   --   --   PHOS 4.0 2.4*  < > 3.5 3.9 5.6* 6.3*  < > = values in this interval not displayed. CBC  Recent Labs Lab 06/10/16 0430 06/11/16 0515 06/12/16 0345  WBC 20.1* 18.0* 13.7*  HGB 9.6* 8.9* 9.3*  HCT 28.6* 27.1* 28.0*  PLT 103* 113* 145*   Coag's  Recent Labs Lab 06/11/16 0515 06/11/16 1600 06/12/16 0345  APTT 67* 66* 69*   Sepsis Markers No results for input(s): LATICACIDVEN, PROCALCITON, O2SATVEN in the last 168 hours. ABG  Recent Labs Lab 06/08/16 2320 06/09/16 0500 06/12/16 0340  PHART 7.393 7.378 7.435  PCO2ART 38.3 42.4 43.4  PO2ART 84.6 98.9 131*   Liver Enzymes  Recent Labs Lab 06/06/16 0327  06/10/16 0430 06/11/16 0515 06/12/16 0345  AST 115*  --   --  18  --   ALT 218*  --   --  34  --   ALKPHOS 112  --   --  105  --   BILITOT 2.8*  --   --  0.7  --   ALBUMIN 2.5*  < > 1.8* 2.0*  2.0* 2.1*  < > = values in this interval not displayed. Cardiac Enzymes No results for input(s): TROPONINI, PROBNP in  the last 168 hours. Glucose  Recent Labs Lab 06/11/16 1157 06/11/16 1539 06/11/16 2013 06/11/16 2337 06/12/16 0357 06/12/16 0811  GLUCAP 155* 167* 164* 139* 132* 152*   Imaging Dg Chest Port 1 View  Result Date: 06/12/2016 CLINICAL DATA:  70 y/o  F; acute respiratory failure. EXAM: PORTABLE CHEST 1 VIEW COMPARISON:  Chest radiograph dated 06/11/2016. FINDINGS: Stable mid and basilar opacities and small effusions. No pneumothorax. Endotracheal tube unchanged in position. Right central venous catheter tip projects over lower SVC. Enteric tube extends below the field of view and abdomen. Anterior cervical fusion hardware and lower thoracic fusion hardware noted. IMPRESSION: Stable bilateral mid and basilar opacities. Small bilateral effusions. Stable lines and tubes. Electronically Signed   By: Kristine Garbe M.D.   On: 06/12/2016  06:14     STUDIES:  CTA ABD with enlarged galbladder and enlarging hepatic cyst. RUQ [place] noted  CULTURES: Blood 8/14 >>E. Coli and enterococcous and klebsiella. Urine 8/14 >>NTD. Abscess 8/14>>>E.Coli and enterococcus (both pan sens)   ANTIBIOTICS: Vanc 8/14 >> 8/16 Zosyn 8/14 >>8/16 Unasyn 8/16>>> 8/21  SIGNIFICANT EVENTS: Intubated in ED after initial evaluation IR perc drain 8/14  LINES/TUBES: ETT 7.5 mm 8/14>>>8/18, 8/18>>> Foley 8/14>>> R IJ TLC 8/14>>> Gall bladder drain (IR) 8/14>>>  DISCUSSION: 70 y/o woman with respiratory failure, severe sepsis of presumed biliary origin and AKI.  Now off pressors, weaning on vent. Hopeful for extubation in the next few days.   ASSESSMENT / PLAN:  PULMONARY A: Acute hypoxic respiratory failure with pulmonary edema and possible aspiration. Moderate persistent asthma P:   Vent support - 8cc/kg, peep 10  Daily SBT  budesonide/brovana with prn xopenex  CARDIOVASCULAR A:  CAD. HTN. Evolving septic shock - improved.  A-fib with RVR. P:  Amio PO lopressor IV prn for HR > 125  RENAL A:   Lactic Acidosis Acute oliguric renal failure  - oliguric in setting septic shock - Scr still rising slowly but UOP improving with lasix  Hypokalemia  P:   Renal following  KVO IVF. Hold lasix as Cr, Na are increasing F/u chem  No acute indication for dialysis and family does not want long term dialysis   GASTROINTESTINAL A:   Abnormal LFTs Probable cholangitis P:   Drain placed in by IR. Post pyloric cor-trak. TF per nutrition. No MRCP due to large burden of metallic objects in the patient's body  ?further intervention needed - surgery following peripherally   HEMATOLOGIC A:   Hx of Antiphospholipid Ab Syndrome Hx of provoked DVT On rivaroxaban Hx of allergy to Heparin products P:  Bivalirudin per pharmacy  CBC in AM. Monitor coags.  INFECTIOUS A:   Severe sepsis, probable biliary origin P:   Finished abx  yesterday PTC per IR.  ENDOCRINE A:   Hypothyroidism P:   Cont home syndroid. TSH ok  NEUROLOGIC A:   Need for sedation P:   RASS goal: -1 Precedex, Fentanyl drip.  FAMILY  - Updates: Husband updated at bedside 8/22. - Inter-disciplinary family meet or Palliative Care meeting due by:  8/21.  Critical care time- 35 mins.  Marshell Garfinkel MD [facility] Pager [phone] If no answer or after 3pm call: [phone] 06/12/2016, 10:49 AM

## 2016-06-12 NOTE — Progress Notes (Signed)
S: intubated O:BP (!) 145/81   Pulse (!) 133   Temp 98.1 F (36.7 C)   Resp 16   Ht 5' (1.524 m)   Wt 81.7 kg (180 lb 1.9 oz)   SpO2 100%   BMI 35.18 kg/m   Intake/Output Summary (Last 24 hours) at 06/12/16 0752 Last data filed at 06/12/16 0600  Gross per 24 hour  Intake          2506.19 ml  Output             3345 ml  Net          -838.81 ml   Weight change: -3.1 kg (-6 lb 13.4 oz) Gen: Intubated but arousable.   CVS: Irreg, irreg Resp:Clear ant Abd: + BS Soft, mild upper quadrant tenderness Ext: 0-tr edema NEURO: Follows commands   . amiodarone  100 mg Oral BID  . antiseptic oral rinse  7 mL Mouth Rinse Q4H  . arformoterol  15 mcg Nebulization BID  . budesonide (PULMICORT) nebulizer solution  0.5 mg Nebulization BID  . feeding supplement (VITAL HIGH PROTEIN)  1,000 mL Per Tube Q24H  . furosemide  80 mg Intravenous Q12H  . insulin aspart  0-9 Units Subcutaneous Q4H  . levothyroxine  75 mcg Per Tube Daily  . pantoprazole sodium  40 mg Per Tube BID   Dg Chest Port 1 View  Result Date: 06/12/2016 CLINICAL DATA:  70 y/o  F; acute respiratory failure. EXAM: PORTABLE CHEST 1 VIEW COMPARISON:  Chest radiograph dated 06/11/2016. FINDINGS: Stable mid and basilar opacities and small effusions. No pneumothorax. Endotracheal tube unchanged in position. Right central venous catheter tip projects over lower SVC. Enteric tube extends below the field of view and abdomen. Anterior cervical fusion hardware and lower thoracic fusion hardware noted. IMPRESSION: Stable bilateral mid and basilar opacities. Small bilateral effusions. Stable lines and tubes. Electronically Signed   By: Kristine Garbe M.D.   On: 06/12/2016 06:14   Dg Chest Portable 1 View  Result Date: 06/11/2016 CLINICAL DATA:  Shortness of breath EXAM: PORTABLE CHEST 1 VIEW COMPARISON:  Chest radiograph 06/10/2016 FINDINGS: Endotracheal tube tip appears to be just above the carina, though the carina is difficult to  visualize. Enteric tube courses beyond the field of view. Multilevel spinal fixation hardware is again noted. Unchanged position of right IJ central venous catheter. There is improved lung aeration, particularly at the left base. Small left pleural effusion again noted. No new areas of consolidation. IMPRESSION: 1. Improved lung aeration, particularly at the left lung base. No new areas of consolidation. 2. Endotracheal tube tip appears to be just above the level of the carina. However, the carina is difficult to visualize on this examination. Electronically Signed   By: Ulyses Jarred M.D.   On: 06/11/2016 06:16   Dg Chest Portable 1 View  Result Date: 06/10/2016 CLINICAL DATA:  Respiratory failure. EXAM: PORTABLE CHEST 1 VIEW COMPARISON:  1 day prior FINDINGS: Patient rotated right. Endotracheal tube terminates 2.6 cm above carina.Right internal jugular line tip at mid SVC. Feeding tube extends beyond the inferior aspect of the film. Normal heart size. Right hemidiaphragm elevation. No definite pleural fluid. No pneumothorax. Left worse than right airspace disease. not significantly changed. IMPRESSION: No change in left greater than right airspace disease which could represent pneumonia and/or pulmonary edema. Electronically Signed   By: Abigail Miyamoto M.D.   On: 06/10/2016 09:26   BMET    Component Value Date/Time   NA 148 (H)  06/12/2016 0345   K 3.4 (L) 06/12/2016 0345   CL 103 06/12/2016 0345   CO2 30 06/12/2016 0345   GLUCOSE 142 (H) 06/12/2016 0345   BUN 110 (H) 06/12/2016 0345   CREATININE 4.16 (H) 06/12/2016 0345   CREATININE 0.50 02/12/2013 1831   CALCIUM 8.3 (L) 06/12/2016 0345   GFRNONAA 10 (L) 06/12/2016 0345   GFRAA 12 (L) 06/12/2016 0345   CBC    Component Value Date/Time   WBC 13.7 (H) 06/12/2016 0345   RBC 3.36 (L) 06/12/2016 0345   HGB 9.3 (L) 06/12/2016 0345   HCT 28.0 (L) 06/12/2016 0345   PLT 145 (L) 06/12/2016 0345   MCV 83.3 06/12/2016 0345   MCV 85.6 12/30/2014  1756   MCH 27.7 06/12/2016 0345   MCHC 33.2 06/12/2016 0345   RDW 15.1 06/12/2016 0345   LYMPHSABS 1.8 07/22/2014 1624   MONOABS 0.4 07/22/2014 1624   EOSABS 0.1 07/22/2014 1624   BASOSABS 0.0 07/22/2014 1624     Assessment:  1.  ARF secondary to Gm neg sepsis, hypotension and IV contrast, non oliguric.  Scr is hopefully stabilizing 2. Enterococcal/E coli sepsis probably secondary to cholecystitis 3. Hypokalemia 4. Mild Hypernatremia 5. A fib  Plan: 1. Replace K  2. Hold lasix as getting slightly hypernatremic 3. Daily Scr.  Anticipate renal recovery in near future   Arneshia Ade T

## 2016-06-13 LAB — CBC
HCT: 25.3 % — ABNORMAL LOW (ref 36.0–46.0)
Hemoglobin: 8.3 g/dL — ABNORMAL LOW (ref 12.0–15.0)
MCH: 27.6 pg (ref 26.0–34.0)
MCHC: 32.8 g/dL (ref 30.0–36.0)
MCV: 84.1 fL (ref 78.0–100.0)
Platelets: 175 10*3/uL (ref 150–400)
RBC: 3.01 MIL/uL — ABNORMAL LOW (ref 3.87–5.11)
RDW: 15.2 % (ref 11.5–15.5)
WBC: 10.1 10*3/uL (ref 4.0–10.5)

## 2016-06-13 LAB — RENAL FUNCTION PANEL
Albumin: 2 g/dL — ABNORMAL LOW (ref 3.5–5.0)
Anion gap: 13 (ref 5–15)
BUN: 121 mg/dL — ABNORMAL HIGH (ref 6–20)
CO2: 31 mmol/L (ref 22–32)
Calcium: 8 mg/dL — ABNORMAL LOW (ref 8.9–10.3)
Chloride: 106 mmol/L (ref 101–111)
Creatinine, Ser: 4.09 mg/dL — ABNORMAL HIGH (ref 0.44–1.00)
GFR calc Af Amer: 12 mL/min — ABNORMAL LOW (ref >60)
GFR calc non Af Amer: 10 mL/min — ABNORMAL LOW (ref >60)
Glucose, Bld: 126 mg/dL — ABNORMAL HIGH (ref 65–99)
Phosphorus: 6.7 mg/dL — ABNORMAL HIGH (ref 2.5–4.6)
Potassium: 3.5 mmol/L (ref 3.5–5.1)
Sodium: 150 mmol/L — ABNORMAL HIGH (ref 135–145)

## 2016-06-13 LAB — GLUCOSE, CAPILLARY
Glucose-Capillary: 113 mg/dL — ABNORMAL HIGH (ref 65–99)
Glucose-Capillary: 116 mg/dL — ABNORMAL HIGH (ref 65–99)
Glucose-Capillary: 121 mg/dL — ABNORMAL HIGH (ref 65–99)
Glucose-Capillary: 141 mg/dL — ABNORMAL HIGH (ref 65–99)

## 2016-06-13 LAB — APTT: aPTT: 60 seconds — ABNORMAL HIGH (ref 24–36)

## 2016-06-13 MED ORDER — FUROSEMIDE 10 MG/ML IJ SOLN
INTRAMUSCULAR | Status: AC
  Filled 2016-06-13: qty 8

## 2016-06-13 MED ORDER — MORPHINE SULFATE (PF) 4 MG/ML IV SOLN: 4.0000 mg | Freq: Once | INTRAVENOUS | Status: AC

## 2016-06-13 MED ORDER — MORPHINE BOLUS VIA INFUSION
5.0000 mg | INTRAVENOUS | Status: DC | PRN
  Administered 2016-06-13: 5 mg via INTRAVENOUS
  Administered 2016-06-13 (×2): 10 mg via INTRAVENOUS
  Administered 2016-06-13: 5 mg via INTRAVENOUS
  Filled 2016-06-13: qty 20

## 2016-06-13 MED ORDER — HYDRALAZINE HCL 20 MG/ML IJ SOLN
5.0000 mg | INTRAMUSCULAR | Status: DC | PRN
  Administered 2016-06-13: 5 mg via INTRAVENOUS
  Filled 2016-06-13 (×2): qty 1

## 2016-06-13 MED ORDER — MORPHINE SULFATE (PF) 2 MG/ML IV SOLN: 1.0000 mg | Freq: Once | INTRAVENOUS | Status: AC

## 2016-06-13 MED ORDER — MORPHINE SULFATE 25 MG/ML IV SOLN
10.0000 mg/h | INTRAVENOUS | Status: DC
  Administered 2016-06-13: 10 mg/h via INTRAVENOUS
  Administered 2016-06-14: 25 mg/h via INTRAVENOUS
  Administered 2016-06-14: 10 mg/h via INTRAVENOUS
  Filled 2016-06-13 (×3): qty 10

## 2016-06-13 MED ORDER — MORPHINE SULFATE (PF) 2 MG/ML IV SOLN
INTRAVENOUS | Status: AC
  Administered 2016-06-13: 1 mg via INTRAVENOUS
  Filled 2016-06-13: qty 1

## 2016-06-13 MED ORDER — FUROSEMIDE 10 MG/ML IJ SOLN
80.0000 mg | Freq: Once | INTRAMUSCULAR | Status: AC
Start: 2016-06-13 — End: 2016-06-13
  Administered 2016-06-13: 80 mg via INTRAVENOUS

## 2016-06-13 MED ORDER — MORPHINE SULFATE (PF) 2 MG/ML IV SOLN
INTRAVENOUS | Status: AC
  Filled 2016-06-13: qty 1

## 2016-06-13 MED ORDER — FREE WATER
200.0000 mL | Freq: Four times a day (QID) | Status: DC
  Administered 2016-06-13 – 2016-06-14 (×2): 200 mL

## 2016-06-13 MED ORDER — METOPROLOL TARTRATE 25 MG PO TABS
25.0000 mg | ORAL_TABLET | Freq: Two times a day (BID) | ORAL | Status: DC
  Administered 2016-06-13: 25 mg via ORAL
  Filled 2016-06-13 (×3): qty 1

## 2016-06-13 NOTE — Progress Notes (Signed)
S: intubated O:BP (!) 153/77   Pulse 84   Temp 98.1 F (36.7 C) (Oral)   Resp 19   Ht 5' (1.524 m)   Wt 82.6 kg (182 lb 1.6 oz)   SpO2 100%   BMI 35.56 kg/m   Intake/Output Summary (Last 24 hours) at 06/13/16 0655 Last data filed at 06/13/16 0600  Gross per 24 hour  Intake          1708.28 ml  Output             2210 ml  Net          -501.72 ml   Weight change: 0.9 kg (1 lb 15.8 oz) Gen: Intubated sleeping   CVS: RRR Resp:Clear ant Abd: + BS Soft, mild upper quadrant tenderness Ext: 0-tr edema NEURO: sedated   . amiodarone  100 mg Oral BID  . antiseptic oral rinse  7 mL Mouth Rinse Q4H  . arformoterol  15 mcg Nebulization BID  . budesonide (PULMICORT) nebulizer solution  0.5 mg Nebulization BID  . feeding supplement (VITAL HIGH PROTEIN)  1,000 mL Per Tube Q24H  . insulin aspart  0-9 Units Subcutaneous Q4H  . levothyroxine  75 mcg Per Tube Daily  . multivitamin  15 mL Oral Daily  . pantoprazole sodium  40 mg Per Tube BID   Dg Chest Port 1 View  Result Date: 06/12/2016 CLINICAL DATA:  70 y/o  F; acute respiratory failure. EXAM: PORTABLE CHEST 1 VIEW COMPARISON:  Chest radiograph dated 06/11/2016. FINDINGS: Stable mid and basilar opacities and small effusions. No pneumothorax. Endotracheal tube unchanged in position. Right central venous catheter tip projects over lower SVC. Enteric tube extends below the field of view and abdomen. Anterior cervical fusion hardware and lower thoracic fusion hardware noted. IMPRESSION: Stable bilateral mid and basilar opacities. Small bilateral effusions. Stable lines and tubes. Electronically Signed   By: Kristine Garbe M.D.   On: 06/12/2016 06:14   BMET    Component Value Date/Time   NA 150 (H) 06/13/2016 0440   K 3.5 06/13/2016 0440   CL 106 06/13/2016 0440   CO2 31 06/13/2016 0440   GLUCOSE 126 (H) 06/13/2016 0440   BUN 121 (H) 06/13/2016 0440   CREATININE 4.09 (H) 06/13/2016 0440   CREATININE 0.50 02/12/2013 1831   CALCIUM 8.0 (L) 06/13/2016 0440   GFRNONAA 10 (L) 06/13/2016 0440   GFRAA 12 (L) 06/13/2016 0440   CBC    Component Value Date/Time   WBC 10.1 06/13/2016 0440   RBC 3.01 (L) 06/13/2016 0440   HGB 8.3 (L) 06/13/2016 0440   HCT 25.3 (L) 06/13/2016 0440   PLT 175 06/13/2016 0440   MCV 84.1 06/13/2016 0440   MCV 85.6 12/30/2014 1756   MCH 27.6 06/13/2016 0440   MCHC 32.8 06/13/2016 0440   RDW 15.2 06/13/2016 0440   LYMPHSABS 1.8 07/22/2014 1624   MONOABS 0.4 07/22/2014 1624   EOSABS 0.1 07/22/2014 1624   BASOSABS 0.0 07/22/2014 1624     Assessment:  1.  ARF secondary to Gm neg sepsis, hypotension and IV contrast, non oliguric.  Scr stable 2. Enterococcal/E coli sepsis probably secondary to cholecystitis 3. Hypokalemia, improved 4. Mild Hypernatremia 5. A fib, paroxysmal.  Now NSR  Plan: 1. Will give free water thru FT 2. Recheck labs in AM.  Hopefully renal fx will start turning around soon   Olubunmi Rothenberger T

## 2016-06-13 NOTE — Progress Notes (Signed)
Pt extubated to 4 L [place] per MD order. Positive cuff leak prior to extubation. Pt able to vocalize. No distress noted. Will cont to monitor.

## 2016-06-13 NOTE — Progress Notes (Signed)
Latoya [name] for Bivalirudin  Indication: atrial fibrillation   Allergies  Allergen Reactions  . Hornet Venom Anaphylaxis  . Pork-Derived Products Anaphylaxis  . Chlorhexidine Itching    Reports that it is the dye in it  . Enoxaparin Sodium     REACTION: thrombocytopenia  . Heparin     REACTION: thrombocytopenia  . Lactose Intolerance (Gi)   . Other     Sensitive to dye in Betadine & Chlorohexadine   . Povidone-Iodine     Sensitivity- but if its wiped off she is able to tolerate betadine   . Indomethacin Rash  . Phenylpropanolamine Rash    Other Reaction: other reaction  . Povidone Iodine Rash    Patient Measurements: Height: 5' (152.4 cm) Weight: 182 lb 1.6 oz (82.6 kg) IBW/kg (Calculated) : 45.5  Vital Signs: Temp: 98.5 F (36.9 C) (08/23 1125) Temp Source: Oral (08/23 1125) BP: 188/82 (08/23 1230) Pulse Rate: 90 (08/23 1230)  Labs:  Recent Labs  06/11/16 0515 06/11/16 1600 06/12/16 0345 06/13/16 0440  HGB 8.9*  --  9.3* 8.3*  HCT 27.1*  --  28.0* 25.3*  PLT 113*  --  145* 175  APTT 67* 66* 69* 60*  CREATININE 4.00*  --  4.16* 4.09*    Estimated Creatinine Clearance: 12.4 mL/min (by C-G formula based on SCr of 4.09 mg/dL).   Assessment: Patient is a 70 yo female who presented 8/14 with sepsis of presumed biliary origin. She was on rivaroxaban PTA for AFib with last dose 8/13PM. She also has h/o antiphospholipid syndrome w/ h/o DVT/PE. She has documented allergy to both heparin and enoxaparin. LFTs and T bili were elevated on admission, therefore not using argatroban.   APTT is therapeutic at 60s. Will continue current bivalirudin rate and  monitor daily aPTT levels and renal function.   Note: Because patient's bivalirudin drip is running at a slow rate, she has not been using a full bag before it expires (has 24 hour expiration).   Goal of Therapy:  Goal aPTT: 50-85 seconds Monitor platelets by anticoagulation  protocol: Yes   Plan:  -Continue bivalirudin 0.05 mg / kg / hr IV infusion -Monitor aPTT every 24 hours -Monitor CBC, renal function -F/U ability to transition back to oral anticoagulation when appropriate   Demetrius Charity, PharmD Acute Care Pharmacy Resident  Pager: [phone] 06/13/2016

## 2016-06-13 NOTE — Progress Notes (Signed)
Post extubation Mrs. Warzecha developed resp distress, hypoxia and increased WOB She has thick secretions that she could not bring up. We tried 80 mg lasix, NRB and HFNC without success  I spoke over the phone with her husband Chrissi Facenda. I explained that we can go ahead with the re intubation but he was clear that she would not want to go in the breathing machine, life support. He just wanted her to be comfortable. We will transition to comfort measures only and start a morphine drip.  Marshell Garfinkel MD [facility] Pager [phone] If no answer or after 3pm call: [phone] 06/13/2016, 2:07 PM

## 2016-06-13 NOTE — Progress Notes (Signed)
219ml Fentanyl gtt wasted in sink w/ Allegra Grana, RN.

## 2016-06-13 NOTE — Care Management Important Message (Signed)
Important Message  Patient Details  Name: Latoya [name] MRN: [id number] Date of Birth: 02-07-1946   Medicare Important Message Given:  Yes    Nathen May 06/13/2016, 11:17 AM

## 2016-06-13 NOTE — Progress Notes (Signed)
   06/13/16 1500  Clinical Encounter Type  Visited With Patient and family together  Visit Type Patient actively dying  Referral From Nurse  Spiritual Encounters  Spiritual Needs Prayer;Emotional  Stress Factors  Family Stress Factors Loss  Responded to page for EOL. Patient awake but not alert, prayed with her prior to husband's arrival and then again with husband. He has his own preacher coming but assured me he would ask for me if I could be of any help. Jasyn Mey, Chaplain

## 2016-06-13 NOTE — Progress Notes (Signed)
PULMONARY / CRITICAL CARE MEDICINE   Name: Latoya [name] MRN: [id number] DOB: 16-Oct-1946    ADMISSION DATE:  06/04/2016  CHIEF COMPLAINT:  Abdominal Pain  HISTORY OF PRESENT ILLNESS:   [name] is a 70 y/o woman with MMP, most notable for antiphospholipid syndrome on AC (hx of one provoked DVT), as well as CAD and asthma who presented to the ED with sudden onset of chills and abdominal pain. In the ED her clinical status declined, becoming less responsive, more tachycardic and more tachypneic. Imaging studies suggested an enlarged gallbladder.  SUBJECTIVE:  No acute events. Doing well on weaning trials  VITAL SIGNS: BP (!) 196/76   Pulse 97   Temp 98.5 F (36.9 C) (Oral)   Resp (!) 33   Ht 5' (1.524 m)   Wt 182 lb 1.6 oz (82.6 kg)   SpO2 (!) 89%   BMI 35.56 kg/m    HEMODYNAMICS: CVP:  [4 mmHg-11 mmHg] 8 mmHg  VENTILATOR SETTINGS: Vent Mode: CPAP;PSV FiO2 (%):  [40 %] 40 % Set Rate:  [15 bmp] 15 bmp Vt Set:  [500 mL] 500 mL PEEP:  [5 cmH20] 5 cmH20 Pressure Support:  [5 cmH20] 5 cmH20 Plateau Pressure:  [20 cmH20-21 cmH20] 21 cmH20  INTAKE / OUTPUT: I/O last 3 completed shifts: In: 2872 [I.V.:1268.2; Other:60; DP/OE:4235.3; IV Piggyback:100] Out: 4105 [Urine:2935; Emesis/NG output:400; Drains:345; Stool:425]  PHYSICAL EXAMINATION: General:  Elderly woman, No distress  Neuro:  Awake, Follows commands. No focal deficits HEENT:  No JVD, thyromegaly Cardiovascular:  RRR, Nl S1/S2, No MRG Lungs:  resps even non labored on vent, diminished throughout, few bibasilar crackles  Abdomen:  Obese, NT, ND and +BS Musculoskeletal:  No joint swelling Skin:  No visible rashes  LABS:  BMET  Recent Labs Lab 06/11/16 0515 06/12/16 0345 06/13/16 0440  NA 146* 148* 150*  K 3.2* 3.4* 3.5  CL 103 103 106  CO2 _0 BUN 99* 110* 121*  CREATININE 4.00* 4.16* 4.09*  GLUCOSE 149* 142* 126*   Electrolytes  Recent Labs Lab 06/07/16 0415 06/08/16 0432   06/09/16 0405  06/11/16 0515 06/12/16 0345 06/13/16 0440  CALCIUM 8.0* 7.9*  < > 7.9*  < > 8.0* 8.3* 8.0*  MG 3.3* 2.9*  --  2.7*  --   --   --   --   PHOS 4.0 2.4*  < > 3.5  < > 5.6* 6.3* 6.7*  < > = values in this interval not displayed. CBC  Recent Labs Lab 06/11/16 0515 06/12/16 0345 06/13/16 0440  WBC 18.0* 13.7* 10.1  HGB 8.9* 9.3* 8.3*  HCT 27.1* 28.0* 25.3*  PLT 113* 145* 175   Coag's  Recent Labs Lab 06/11/16 1600 06/12/16 0345 06/13/16 0440  APTT 66* 69* 60*   Sepsis Markers No results for input(s): LATICACIDVEN, PROCALCITON, O2SATVEN in the last 168 hours. ABG  Recent Labs Lab 06/08/16 2320 06/09/16 0500 06/12/16 0340  PHART 7.393 7.378 7.435  PCO2ART 38.3 42.4 43.4  PO2ART 84.6 98.9 131*   Liver Enzymes  Recent Labs Lab 06/11/16 0515 06/12/16 0345 06/13/16 0440  AST 18  --   --   ALT 34  --   --   ALKPHOS 105  --   --   BILITOT 0.7  --   --   ALBUMIN 2.0*  2.0* 2.1* 2.0*   Cardiac Enzymes No results for input(s): TROPONINI, PROBNP in the last 168 hours. Glucose  Recent Labs Lab 06/12/16 1543 06/12/16 2000 06/13/16  0031 06/13/16 0420 06/13/16 0734 06/13/16 1123  GLUCAP 182* 149* 141* 121* 116* 113*   Imaging No results found.   STUDIES:  CTA ABD with enlarged galbladder and enlarging hepatic cyst. RUQ [place] noted  CULTURES: Blood 8/14 >>E. Coli and enterococcous and klebsiella. Urine 8/14 >>NTD. Abscess 8/14>>>E.Coli and enterococcus (both pan sens)   ANTIBIOTICS: Vanc 8/14 >> 8/16 Zosyn 8/14 >>8/16 Unasyn 8/16>>> 8/21  SIGNIFICANT EVENTS: Intubated in ED after initial evaluation IR perc drain 8/14  LINES/TUBES: ETT 7.5 mm 8/14>>>8/18, 8/18>>> Foley 8/14>>> R IJ TLC 8/14>>> Gall bladder drain (IR) 8/14>>>  DISCUSSION: 70 y/o woman with respiratory failure, severe sepsis of presumed biliary origin and AKI. Now off pressors, weaning on vent.   ASSESSMENT / PLAN:  PULMONARY A: Acute hypoxic respiratory  failure with pulmonary edema and possible aspiration. Moderate persistent asthma P:   Vent support - 8cc/kg, peep 10  Doing well on weaning trials. Extubate budesonide/brovana with prn xopenex  CARDIOVASCULAR A:  CAD. HTN. Evolving septic shock - improved.  A-fib with RVR. P:  Amio PO lopressor IV prn for HR > 125  RENAL A:   Lactic Acidosis Acute oliguric renal failure  - oliguric in setting septic shock - Scr still rising slowly but UOP improving with lasix  Hypokalemia  P:   Renal following  KVO IVF. Hold lasix as Cr, Na are increasing F/u chem  No acute indication for dialysis and family does not want long term dialysis   GASTROINTESTINAL A:   Abnormal LFTs Probable cholangitis P:   Drain placed in by IR. Post pyloric cor-trak. TF per nutrition. No MRCP due to large burden of metallic objects in the patient's body  ?further intervention needed - surgery following peripherally   HEMATOLOGIC A:   Hx of Antiphospholipid Ab Syndrome Hx of provoked DVT On rivaroxaban Hx of allergy to Heparin products P:  Bivalirudin per pharmacy  CBC in AM. Monitor coags.  INFECTIOUS A:   Severe sepsis, probable biliary origin P:   Finished 10 days abx PTC per IR.  ENDOCRINE A:   Hypothyroidism P:   Cont home syndroid. TSH ok  NEUROLOGIC A:   Need for sedation P:   RASS goal: -1 Precedex, Fentanyl drip.  FAMILY  - Updates: Husband updated at bedside 8/22. - Inter-disciplinary family meet or Palliative Care meeting due by:  8/21.  Critical care time- 35 mins.  Marshell Garfinkel MD [facility] Pager [phone] If no answer or after 3pm call: [phone] 06/13/2016, 1:20 PM

## 2016-06-14 ENCOUNTER — Inpatient Hospital Stay (HOSPITAL_COMMUNITY): Payer: Medicare Other

## 2016-06-14 LAB — RENAL FUNCTION PANEL
ANION GAP: 14 (ref 5–15)
Albumin: 2.3 g/dL — ABNORMAL LOW (ref 3.5–5.0)
BUN: 121 mg/dL — ABNORMAL HIGH (ref 6–20)
CALCIUM: 8.2 mg/dL — AB (ref 8.9–10.3)
CHLORIDE: 104 mmol/L (ref 101–111)
CO2: 31 mmol/L (ref 22–32)
Creatinine, Ser: 3.91 mg/dL — ABNORMAL HIGH (ref 0.44–1.00)
GFR calc non Af Amer: 11 mL/min — ABNORMAL LOW (ref >60)
GFR, EST AFRICAN AMERICAN: 13 mL/min — AB (ref >60)
Glucose, Bld: 128 mg/dL — ABNORMAL HIGH (ref 65–99)
Phosphorus: 8.4 mg/dL — ABNORMAL HIGH (ref 2.5–4.6)
Potassium: 3.2 mmol/L — ABNORMAL LOW (ref 3.5–5.1)
SODIUM: 149 mmol/L — AB (ref 135–145)

## 2016-06-14 LAB — GLUCOSE, CAPILLARY
GLUCOSE-CAPILLARY: 98 mg/dL (ref 65–99)
GLUCOSE-CAPILLARY: 86 mg/dL (ref 65–99)
Glucose-Capillary: 108 mg/dL — ABNORMAL HIGH (ref 65–99)

## 2016-06-14 LAB — APTT
aPTT: 58 seconds — ABNORMAL HIGH (ref 24–36)
aPTT: 64 seconds — ABNORMAL HIGH (ref 24–36)

## 2016-06-14 MED ORDER — METOPROLOL TARTRATE 5 MG/5ML IV SOLN
2.5000 mg | Freq: Four times a day (QID) | INTRAVENOUS | Status: DC
  Administered 2016-06-14 – 2016-06-15 (×4): 2.5 mg via INTRAVENOUS
  Filled 2016-06-14 (×5): qty 5

## 2016-06-14 MED ORDER — FAMOTIDINE IN NACL 20-0.9 MG/50ML-% IV SOLN
20.0000 mg | INTRAVENOUS | Status: DC
  Administered 2016-06-14 – 2016-06-19 (×6): 20 mg via INTRAVENOUS
  Filled 2016-06-14 (×8): qty 50

## 2016-06-14 MED ORDER — FUROSEMIDE 10 MG/ML IJ SOLN
80.0000 mg | Freq: Two times a day (BID) | INTRAMUSCULAR | Status: DC
  Administered 2016-06-14 – 2016-06-19 (×11): 80 mg via INTRAVENOUS
  Filled 2016-06-14 (×12): qty 8

## 2016-06-14 MED ORDER — POTASSIUM CHLORIDE 10 MEQ/50ML IV SOLN
10.0000 meq | INTRAVENOUS | Status: AC
  Administered 2016-06-14 (×2): 10 meq via INTRAVENOUS
  Filled 2016-06-14 (×2): qty 50

## 2016-06-14 MED ORDER — LEVOTHYROXINE SODIUM 100 MCG IV SOLR
37.0000 ug | Freq: Every day | INTRAVENOUS | Status: DC
  Administered 2016-06-15 – 2016-06-20 (×6): 37 ug via INTRAVENOUS
  Filled 2016-06-14 (×7): qty 5

## 2016-06-14 MED ORDER — POTASSIUM CHLORIDE 10 MEQ/50ML IV SOLN: 10.0000 meq | INTRAVENOUS | Status: AC

## 2016-06-14 MED ORDER — DILTIAZEM HCL 100 MG IV SOLR
5.0000 mg/h | INTRAVENOUS | Status: DC
  Administered 2016-06-14: 5 mg/h via INTRAVENOUS
  Administered 2016-06-15: 10 mg/h via INTRAVENOUS
  Filled 2016-06-14 (×3): qty 100

## 2016-06-14 NOTE — Care Management Note (Addendum)
Case Management Note  Patient Details  Name: Latoya [name] MRN: [id number] Date of Birth: May 18, 1946  Subjective/Objective:      Pt admitted with severe sepsis of presumed biliary origin             Action/Plan:  PTA from home with husband.  CM will continue to follow for discharge needs   Expected Discharge Date:                  Expected Discharge Plan:     In-House Referral:     Discharge planning Services  CM Consult  Post Acute Care Choice:    Choice offered to:     DME Arranged:    DME Agency:     HH Arranged:    HH Agency:     Status of Service:  In process, will continue to follow  If discussed at Long Length of Stay Meetings, dates discussed:    Additional Comments:  Maryclare Labrador, RN 06/14/2016, 9:45 AM Case Management Note  Patient Details  Name: Latoya [name] MRN: [id number] Date of Birth: January 04, 1946  Subjective/Objective:      Pt admitted with severe sepsis of presumed biliary origin             Action/Plan:  PTA from home with husband.  CM will continue to follow for discharge needs   Expected Discharge Date:                  Expected Discharge Plan:     In-House Referral:     Discharge planning Services  CM Consult  Post Acute Care Choice:    Choice offered to:     DME Arranged:    DME Agency:     HH Arranged:    HH Agency:     Status of Service:  In process, will continue to follow  If discussed at Long Length of Stay Meetings, dates discussed:    Additional Comments: 06/14/2016  Pt is no longer comfort care.   Post extubation yesterday Latoya [name] developed resp distress, hypoxia and increased WOB - thick secretions that she was unable to clear.  Pt has now transitioned to comfort care, non rebreather and morphine drip.  06/05/16 Pt remains on ventilator - ongoing agitation issues - propofol added yesterday due to uncontrolled agitation on continuous fentanyl Maryclare Labrador, RN 06/14/2016, 9:45 AM

## 2016-06-14 NOTE — Progress Notes (Signed)
PULMONARY / CRITICAL CARE MEDICINE   Name: Latoya [name] MRN: [id number] DOB: [birth year]    ADMISSION DATE:  06/04/2016  CHIEF COMPLAINT:  Abdominal Pain  HISTORY OF PRESENT ILLNESS:   Latoya [name] is a 70 y/o woman with MMP, most notable for antiphospholipid syndrome on AC (hx of one provoked DVT), as well as CAD and asthma who presented to the ED with sudden onset of chills and abdominal pain. In the ED her clinical status declined, becoming less responsive, more tachycardic and more tachypneic. Imaging studies suggested an enlarged gallbladder.  SUBJECTIVE:  No acute events. Doing well on weaning trials  VITAL SIGNS: BP 140/63   Pulse 87   Temp 98.3 F (36.8 C) (Oral)   Resp (!) 9   Ht 5' (1.524 m)   Wt 182 lb 1.6 oz (82.6 kg)   SpO2 100%   BMI 35.56 kg/m    HEMODYNAMICS: CVP:  [8 mmHg] 8 mmHg  VENTILATOR SETTINGS: FiO2 (%):  [80 %-100 %] 80 %  INTAKE / OUTPUT: I/O last 3 completed shifts: In: 1986.5 [I.V.:1257.8; NG/GT:728.8] Out: 3865 [Urine:3115; Drains:350; Stool:400]  PHYSICAL EXAMINATION: General:  Elderly woman, No distress  Neuro:  Awake, Follows commands. No focal deficits HEENT:  No JVD, thyromegaly Cardiovascular:  RRR, Nl S1/S2, No MRG Lungs:  resps even non labored on vent, diminished throughout, few bibasilar crackles  Abdomen:  Obese, NT, ND and +BS Musculoskeletal:  No joint swelling Skin:  No visible rashes  LABS:  BMET  Recent Labs Lab 06/12/16 0345 06/13/16 0440 06/14/16 0500  NA 148* 150* 149*  K 3.4* 3.5 3.2*  CL 103 106 104  CO2 '30 31 31  ' BUN 110* 121* 121*  CREATININE 4.16* 4.09* 3.91*  GLUCOSE 142* 126* 128*   Electrolytes  Recent Labs Lab 06/08/16 0432  06/09/16 0405  06/12/16 0345 06/13/16 0440 06/14/16 0500  CALCIUM 7.9*  < > 7.9*  < > 8.3* 8.0* 8.2*  MG 2.9*  --  2.7*  --   --   --   --   PHOS 2.4*  < > 3.5  < > 6.3* 6.7* 8.4*  < > = values in this interval not displayed. CBC  Recent Labs Lab 06/11/16 0515  06/12/16 0345 06/13/16 0440  WBC 18.0* 13.7* 10.1  HGB 8.9* 9.3* 8.3*  HCT 27.1* 28.0* 25.3*  PLT 113* 145* 175   Coag's  Recent Labs Lab 06/12/16 0345 06/13/16 0440 06/14/16 0500  APTT 69* 60* 58*   Sepsis Markers No results for input(s): LATICACIDVEN, PROCALCITON, O2SATVEN in the last 168 hours. ABG  Recent Labs Lab 06/08/16 2320 06/09/16 0500 06/12/16 0340  PHART 7.393 7.378 7.435  PCO2ART 38.3 42.4 43.4  PO2ART 84.6 98.9 131*   Liver Enzymes  Recent Labs Lab 06/11/16 0515 06/12/16 0345 06/13/16 0440 06/14/16 0500  AST 18  --   --   --   ALT 34  --   --   --   ALKPHOS 105  --   --   --   BILITOT 0.7  --   --   --   ALBUMIN 2.0*  2.0* 2.1* 2.0* 2.3*   Cardiac Enzymes No results for input(s): TROPONINI, PROBNP in the last 168 hours. Glucose  Recent Labs Lab 06/12/16 1543 06/12/16 2000 06/13/16 0031 06/13/16 0420 06/13/16 0734 06/13/16 1123  GLUCAP 182* 149* 141* 121* 116* 113*   Imaging Dg Chest Port 1 View  Result Date: 06/14/2016 CLINICAL DATA:  70 y/o  F; acute  respiratory failure. EXAM: PORTABLE CHEST 1 VIEW COMPARISON:  Chest radiograph 06/12/2016. FINDINGS: Right central venous catheter tip projects over lower SVC. Interval removal of enteric tube. Worsening airspace opacities diffusely and small effusions probably representing pulmonary edema, possibly pneumonia. Surgical hardware partially visualized of cervical and thoracolumbar spinal fusion. No acute osseous abnormality. IMPRESSION: Worsening diffuse airspace opacities and small pleural effusions probably representing pulmonary edema, possibly pneumonia. Electronically Signed   By: Kristine Garbe M.D.   On: 06/14/2016 05:45     STUDIES:  CTA ABD with enlarged galbladder and enlarging hepatic cyst. RUQ [place] noted  CULTURES: Blood 8/14 >>E. Coli and enterococcous and klebsiella. Urine 8/14 >>NTD. Abscess 8/14>>>E.Coli and enterococcus (both pan sens)   ANTIBIOTICS: Vanc 8/14  >> 8/16 Zosyn 8/14 >>8/16 Unasyn 8/16>>> 8/21  SIGNIFICANT EVENTS: Intubated in ED after initial evaluation IR perc drain 8/14  LINES/TUBES: ETT 7.5 mm 8/14>>>8/18, 8/18>>> 8/24 Foley 8/14>>> R IJ TLC 8/14>>> Gall bladder drain (IR) 8/14>>>  DISCUSSION: 70 y/o woman with respiratory failure, severe sepsis of presumed biliary origin and AKI. Extubated yesterday.  Still with tenuous resp status.   ASSESSMENT / PLAN:  PULMONARY A: Acute hypoxic respiratory failure with pulmonary edema and possible aspiration. Moderate persistent asthma P:   Extubated.  Budesonide/brovana with prn xopenex  CARDIOVASCULAR A:  CAD. HTN. Evolving septic shock - improved.  A-fib with RVR. P:  Amio PO lopressor IV prn for HR > 125  RENAL A:   Lactic Acidosis Acute oliguric renal failure  - oliguric in setting septic shock - Scr still rising slowly but UOP improving with lasix  Hypokalemia  P:   KVO IVF. Good urine output. Continue lasix F/u chem  No acute indication for dialysis and family does not want dialysis   GASTROINTESTINAL A:   Abnormal LFTs Probable cholangitis P:   Drain placed in by IR. Swallow eval Keep NPO No MRCP due to large burden of metallic objects in the patient's body  ?further intervention needed - surgery following peripherally   HEMATOLOGIC A:   Hx of Antiphospholipid Ab Syndrome Hx of provoked DVT On rivaroxaban Hx of allergy to Heparin products P:  Bivalirudin per pharmacy  CBC in AM. Monitor coags.  INFECTIOUS A:   Severe sepsis, probable biliary origin P:   Finished 10 days abx PTC per IR.  ENDOCRINE A:   Hypothyroidism P:   Cont home syndroid. TSH ok  NEUROLOGIC A:   Off sedation On morphine drip P:   Wean off morphine drip. Use PRN  FAMILY  - Updates: Husband updated. I had a long discussion with him today. Surprisingly Latoya [name] has pulled through the night and she looks better today. I think we should give her a  chance to pull through this. I will not withdraw care on her. We will continue all medical management. She is still DNR, DNI and no dialysis. If she deteriorates again then she will be full comfort only. - Inter-disciplinary family meet or Palliative Care meeting due by:  8/21.  Critical care time- 35 mins.  Marshell Garfinkel MD [facility] Pager [phone] If no answer or after 3pm call: [phone] 06/14/2016, 11:00 AM

## 2016-06-14 NOTE — Progress Notes (Signed)
S: CO RUQ pain O:BP 140/63   Pulse 100   Temp 98.5 F (36.9 C) (Oral)   Resp (!) 9   Ht 5' (1.524 m)   Wt 82.6 kg (182 lb 1.6 oz)   SpO2 100%   BMI 35.56 kg/m   Intake/Output Summary (Last 24 hours) at 06/14/16 0700 Last data filed at 06/14/16 0600  Gross per 24 hour  Intake          1287.79 ml  Output             2125 ml  Net          -837.21 ml   Weight change: 0 kg (0 lb) Gen: Non rebreather mask CVS: Sl tachy, reg Resp: Basilar crackles Abd: + BS Soft, mild upper quadrant tenderness Ext:  No edema legs  + edema hands NEURO: Awake and alert, follows commands, difficulty talking   . amiodarone  100 mg Oral BID  . antiseptic oral rinse  7 mL Mouth Rinse Q4H  . arformoterol  15 mcg Nebulization BID  . budesonide (PULMICORT) nebulizer solution  0.5 mg Nebulization BID  . feeding supplement (VITAL HIGH PROTEIN)  1,000 mL Per Tube Q24H  . free water  200 mL Per Tube Q6H  . insulin aspart  0-9 Units Subcutaneous Q4H  . levothyroxine  75 mcg Per Tube Daily  . metoprolol tartrate  25 mg Oral BID  . multivitamin  15 mL Oral Daily  . pantoprazole sodium  40 mg Per Tube BID   Dg Chest Port 1 View  Result Date: 06/14/2016 CLINICAL DATA:  70 y/o  F; acute respiratory failure. EXAM: PORTABLE CHEST 1 VIEW COMPARISON:  Chest radiograph 06/12/2016. FINDINGS: Right central venous catheter tip projects over lower SVC. Interval removal of enteric tube. Worsening airspace opacities diffusely and small effusions probably representing pulmonary edema, possibly pneumonia. Surgical hardware partially visualized of cervical and thoracolumbar spinal fusion. No acute osseous abnormality. IMPRESSION: Worsening diffuse airspace opacities and small pleural effusions probably representing pulmonary edema, possibly pneumonia. Electronically Signed   By: Kristine Garbe M.D.   On: 06/14/2016 05:45   BMET    Component Value Date/Time   NA 149 (H) 06/14/2016 0500   K 3.2 (L) 06/14/2016 0500   CL 104 06/14/2016 0500   CO2 31 06/14/2016 0500   GLUCOSE 128 (H) 06/14/2016 0500   BUN 121 (H) 06/14/2016 0500   CREATININE 3.91 (H) 06/14/2016 0500   CREATININE 0.50 02/12/2013 1831   CALCIUM 8.2 (L) 06/14/2016 0500   GFRNONAA 11 (L) 06/14/2016 0500   GFRAA 13 (L) 06/14/2016 0500   CBC    Component Value Date/Time   WBC 10.1 06/13/2016 0440   RBC 3.01 (L) 06/13/2016 0440   HGB 8.3 (L) 06/13/2016 0440   HCT 25.3 (L) 06/13/2016 0440   PLT 175 06/13/2016 0440   MCV 84.1 06/13/2016 0440   MCV 85.6 12/30/2014 1756   MCH 27.6 06/13/2016 0440   MCHC 32.8 06/13/2016 0440   RDW 15.2 06/13/2016 0440   LYMPHSABS 1.8 07/22/2014 1624   MONOABS 0.4 07/22/2014 1624   EOSABS 0.1 07/22/2014 1624   BASOSABS 0.0 07/22/2014 1624     Assessment:  1.  ARF secondary to Gm neg sepsis, hypotension and IV contrast, non oliguric.  Scr sl better 2. Enterococcal/E coli sepsis probably secondary to cholecystitis 3. Hypokalemia 4. Mild Hypernatremia 5. A fib, paroxysmal.  Now NSR  Plan: 1.  Note plan for "comfort care" yet labs done.  K  is low and should be replaced if plan is more than just comfort.   Note that she is on morphine gtt.  If plan is truly for just comfort care then I would DC labs.   Daelon Dunivan T

## 2016-06-14 NOTE — Progress Notes (Signed)
[facility] for Bivalirudin  Indication: atrial fibrillation   Allergies  Allergen Reactions  . Hornet Venom Anaphylaxis  . Pork-Derived Products Anaphylaxis  . Chlorhexidine Itching    Reports that it is the dye in it  . Enoxaparin Sodium     REACTION: thrombocytopenia  . Heparin     REACTION: thrombocytopenia  . Lactose Intolerance (Gi)   . Other     Sensitive to dye in Betadine & Chlorohexadine   . Povidone-Iodine     Sensitivity- but if its wiped off she is able to tolerate betadine   . Indomethacin Rash  . Phenylpropanolamine Rash    Other Reaction: other reaction  . Povidone Iodine Rash    Patient Measurements: Height: 5' (152.4 cm) Weight: 182 lb 1.6 oz (82.6 kg) IBW/kg (Calculated) : 45.5  Vital Signs: Temp: 98.6 F (37 C) (08/24 1700) Temp Source: Axillary (08/24 1700) Pulse Rate: 87 (08/24 0700)  Labs:  Recent Labs  06/12/16 0345 06/13/16 0440 06/14/16 0500 06/14/16 1717  HGB 9.3* 8.3*  --   --   HCT 28.0* 25.3*  --   --   PLT 145* 175  --   --   APTT 69* 60* 58* 64*  CREATININE 4.16* 4.09* 3.91*  --     Estimated Creatinine Clearance: 12.9 mL/min (by C-G formula based on SCr of 3.91 mg/dL).   Assessment: Patient is a 70 yo female who presented 8/14 with sepsis of presumed biliary origin. She was on rivaroxaban PTA for AFib with last dose 8/13PM. She also has h/o antiphospholipid syndrome w/ h/o DVT/PE. She has documented allergy to both heparin and enoxaparin. LFTs and T bili were elevated on admission, therefore not using argatroban.   APTT is therapeutic this evening.  No bleeding or complications noted per chart notes.  Goal of Therapy:  Goal aPTT: 50-85 seconds Monitor platelets by anticoagulation protocol: Yes   Plan:  -Continue bivalirudin 0.05 mg / kg / hr IV infusion -Monitor aPTT every 12 hours -Monitor CBC, renal function -Follow up ability to transition back to oral anticoagulation when  appropriate   Demetrius Charity, PharmD Acute Care Pharmacy Resident  Pager: [phone] 06/14/2016

## 2016-06-14 NOTE — Progress Notes (Signed)
Patient ID: Latoya CLEMON, female   DOB: 1946/01/22, 70 y.o.   MRN: [id number] Social visit with mutual patient. Awake, alert, conversant. Husband present. Expecting transfer to floor today.   Verdis Prime RN BSN Neurosurgery

## 2016-06-14 NOTE — Progress Notes (Signed)
[facility] Progress Note Patient Name: Latoya [name] [facility] DOB: 07/05/1946 MRN: [id number]   Date of Service  06/14/2016  HPI/Events of Note  AFIB with RVR - Ventricular rate = 122 and BP = 160/92.   eICU Interventions  Will order: 1. Diltiazem IV infusion. Titrate to HR 65-105.     Intervention Category Major Interventions: Arrhythmia - evaluation and management  [name],Steven Cornelia Copa 06/14/2016, 6:25 PM

## 2016-06-14 NOTE — Progress Notes (Signed)
Neb given, pt has weak non-productive cough, scattered rhonchi heard throughout. Pt is stable at this time. RN at bedside

## 2016-06-14 NOTE — Progress Notes (Signed)
SLP Cancellation Note  Patient Details Name: Latoya [name] MRN: [id number] DOB: 1946-02-07   Cancelled treatment:       Reason Eval/Treat Not Completed: Other (comment) Pt now comfort care. Per MD, swallow evaluation no longer needed. Will sign off.   Germain Osgood 06/14/2016, 9:06 AM  Germain Osgood, M.A. CCC-SLP [phone]

## 2016-06-14 NOTE — Progress Notes (Signed)
[facility] for Bivalirudin  Indication: atrial fibrillation   Allergies  Allergen Reactions  . Hornet Venom Anaphylaxis  . Pork-Derived Products Anaphylaxis  . Chlorhexidine Itching    Reports that it is the dye in it  . Enoxaparin Sodium     REACTION: thrombocytopenia  . Heparin     REACTION: thrombocytopenia  . Lactose Intolerance (Gi)   . Other     Sensitive to dye in Betadine & Chlorohexadine   . Povidone-Iodine     Sensitivity- but if its wiped off she is able to tolerate betadine   . Indomethacin Rash  . Phenylpropanolamine Rash    Other Reaction: other reaction  . Povidone Iodine Rash    Patient Measurements: Height: 5' (152.4 cm) Weight: 182 lb 1.6 oz (82.6 kg) IBW/kg (Calculated) : 45.5  Vital Signs: Temp: 98.3 F (36.8 C) (08/24 0808) Temp Source: Oral (08/24 0808) BP: 140/63 (08/24 0420) Pulse Rate: 87 (08/24 0700)  Labs:  Recent Labs  06/12/16 0345 06/13/16 0440 06/14/16 0500  HGB 9.3* 8.3*  --   HCT 28.0* 25.3*  --   PLT 145* 175  --   APTT 69* 60* 58*  CREATININE 4.16* 4.09* 3.91*    Estimated Creatinine Clearance: 12.9 mL/min (by C-G formula based on SCr of 3.91 mg/dL).   Assessment: Patient is a 70 yo female who presented 8/14 with sepsis of presumed biliary origin. She was on rivaroxaban PTA for AFib with last dose 8/13PM. She also has h/o antiphospholipid syndrome w/ h/o DVT/PE. She has documented allergy to both heparin and enoxaparin. LFTs and T bili were elevated on admission, therefore not using argatroban.   APTT is therapeutic today at 58s. The aPTT levels are slowly trending down, likely due to her improving renal function. Will continue current bivalirudin rate and monitor aPTT levels every 12 hours.  Note: Because patient's bivalirudin drip is running at a slow rate, she has not been using a full bag before it expires (has 24 hour expiration).   Goal of Therapy:  Goal aPTT: 50-85  seconds Monitor platelets by anticoagulation protocol: Yes   Plan:  -Continue bivalirudin 0.05 mg / kg / hr IV infusion -Monitor aPTT every 12 hours -Monitor CBC, renal function -Follow up ability to transition back to oral anticoagulation when appropriate   Demetrius Charity, PharmD Acute Care Pharmacy Resident  Pager: [phone] 06/14/2016

## 2016-06-14 NOTE — Evaluation (Signed)
Clinical/Bedside Swallow Evaluation Patient Details  Name: Latoya [name] MRN: [id number] Date of Birth: 02-Sep-1946  Today's Date: 06/14/2016 Time: SLP Start Time (ACUTE ONLY): 1215 SLP Stop Time (ACUTE ONLY): 1232 SLP Time Calculation (min) (ACUTE ONLY): 17 min  Past Medical History:  Past Medical History:  Diagnosis Date  . Anemia, iron deficiency   . Antiphospholipid syndrome (HCC)    with hypercoagulable state  . Asthma    extrinsic; moderate, persistant, nml spirometry 2010, nml CXR 1/08  . Bladder troubles    REPORTS INFECTIONS ON OCCASION DUE TO URETHRA MEATUS STRICTURE AT BIRTH   . Blood dyscrasia    antiphospholipid disorder  . CAD (coronary artery disease)    50% mid LAD, 80% ostial D1 and moderate 80% mid circ by vath 2003  . Cancer ([place])    basal cell removed fr. L arm   . Complication of anesthesia    cardiac arrest- in OR, at age 9 (89)y.o. during Scalenotomy in her early 20's  . Coronary heart disease   . Drug allergy    heparin/lovenox  . DVT (deep venous thrombosis) ([place])   . Erosive gastritis 1994  . Family history of adverse reaction to anesthesia    some family members have had trouble waking up  . GERD (gastroesophageal reflux disease)   . H/O hiatal hernia   . HLD (hyperlipidemia)   . HTN (hypertension)    [name] at [facility], manages pt.  LOV 03/2014  . Hypothyroidism   . Liver spot    cyst- - no biopsy, but told that its benign   . PAF (paroxysmal atrial fibrillation) (HCC)    not on coumadin therapy  . Pulmonary embolism ([place])    related to back surgery with prior coumadin use, now off  . Spondylosis, lumbosacral    ARTHRITIS- OA    Past Surgical History:  Past Surgical History:  Procedure Laterality Date  . ABDOMINAL HYSTERECTOMY     partial abdominal -1998  . BACK SURGERY     x13 cervical and lumbar thoracic spine surgery  . BREAST ENHANCEMENT SURGERY    . BREAST SURGERY     all benign cysts x3   . CARDIAC CATHETERIZATION     2005  . cleft lip and palate repair     70 yo   . hysterectomy    . IR GENERIC HISTORICAL  06/04/2016   IR PERC CHOLECYSTOSTOMY 06/04/2016 Greggory Keen, MD [facility]  . JOINT REPLACEMENT    . POSTERIOR CERVICAL FUSION/FORAMINOTOMY Right 08/31/2014   Procedure: Right cervical six-seven foraminotomy, Cervical five-Thoracic one fusion, Cervical six-seven lateral mass screws;  Surgeon: Erline Levine, MD;  Location: [facility];  Service: Neurosurgery;  Laterality: Right;  . spina bifida repair     70 yo   . TONSILLECTOMY    . TOTAL KNEE ARTHROPLASTY     left   HPI:  70 y/o woman with respiratory failure, severe sepsis of presumed biliary origin and AKI. She required intubation 8/14-8/18, and again 8/18-8/23. PMH: PE, PAF, HTN, HLD, HH, GERD, CAD, asthma, spondylosis requiring cervical fusion C6-C7, and cleft lip/partial palate s/p surgical intervention as a child   Assessment / Plan / Recommendation Clinical Impression  Pt has evidence of an acute dysphagia s/p prolonged intubation with generalized deconditioning and current respiratory status further impacting overall function. One ice chip provided with delayed cough response. Additional bolus could not be attempted, as pt had prolonged cough response and desaturations upon further attempts to remove mask. Educated  pt/spouse about her high risk for aspiration at this time, with need for additional time post-extubation to allow for safer intake. Recommend that pt remain NPO pending additional SLP f/u.    Aspiration Risk  Severe aspiration risk    Diet Recommendation NPO   Medication Administration: Via alternative means    Other  Recommendations Oral Care Recommendations: Oral care QID   Follow up Recommendations   (tba)    Frequency and Duration min 2x/week  2 weeks       Prognosis Prognosis for Safe Diet Advancement: Fair Barriers to Reach Goals: Other (Comment) (respiratory status)      Swallow Study   General HPI: 70 y/o  woman with respiratory failure, severe sepsis of presumed biliary origin and AKI. She required intubation 8/14-8/18, and again 8/18-8/23. PMH: PE, PAF, HTN, HLD, HH, GERD, CAD, asthma, spondylosis requiring cervical fusion C6-C7, and cleft lip/partial palate s/p surgical intervention as a child Previous Swallow Assessment: MBS 03/2014 recommending Dys 3 diet and thin liquids with concern for primary esophageal difficulties Diet Prior to this Study: Thin liquids Temperature Spikes Noted: No Respiratory Status: Other (comment) (PRB) History of Recent Intubation: Yes Length of Intubations (days): 10 days (across 2 intubations) Date extubated: 06/13/16 Behavior/Cognition: Alert;Cooperative Oral Care Completed by SLP: Yes Patient Positioning: Upright in bed Baseline Vocal Quality: Low vocal intensity Volitional Cough: Weak    Oral/Motor/Sensory Function Overall Oral Motor/Sensory Function: Generalized oral weakness   Ice Chips Ice chips: Impaired Presentation: Spoon Pharyngeal Phase Impairments: Cough - Delayed   Thin Liquid Thin Liquid: Not tested    Nectar Thick Nectar Thick Liquid: Not tested   Honey Thick Honey Thick Liquid: Not tested   Puree Puree: Not tested   Solid   GO   Solid: Not tested        Germain Osgood 06/14/2016,2:30 PM  Germain Osgood, M.A. CCC-SLP [phone]

## 2016-06-15 ENCOUNTER — Inpatient Hospital Stay (HOSPITAL_COMMUNITY): Payer: Medicare Other

## 2016-06-15 LAB — BASIC METABOLIC PANEL
Anion gap: 17 — ABNORMAL HIGH (ref 5–15)
BUN: 111 mg/dL — ABNORMAL HIGH (ref 6–20)
CALCIUM: 8.7 mg/dL — AB (ref 8.9–10.3)
CO2: 29 mmol/L (ref 22–32)
CREATININE: 3.69 mg/dL — AB (ref 0.44–1.00)
Chloride: 105 mmol/L (ref 101–111)
GFR calc non Af Amer: 12 mL/min — ABNORMAL LOW (ref >60)
GFR, EST AFRICAN AMERICAN: 13 mL/min — AB (ref >60)
Glucose, Bld: 116 mg/dL — ABNORMAL HIGH (ref 65–99)
Potassium: 3 mmol/L — ABNORMAL LOW (ref 3.5–5.1)
SODIUM: 151 mmol/L — AB (ref 135–145)

## 2016-06-15 LAB — APTT
aPTT: 69 seconds — ABNORMAL HIGH (ref 24–36)
aPTT: 83 seconds — ABNORMAL HIGH (ref 24–36)

## 2016-06-15 LAB — GLUCOSE, CAPILLARY
GLUCOSE-CAPILLARY: 116 mg/dL — AB (ref 65–99)
GLUCOSE-CAPILLARY: 102 mg/dL — AB (ref 65–99)
Glucose-Capillary: 101 mg/dL — ABNORMAL HIGH (ref 65–99)
Glucose-Capillary: 111 mg/dL — ABNORMAL HIGH (ref 65–99)
Glucose-Capillary: 164 mg/dL — ABNORMAL HIGH (ref 65–99)
Glucose-Capillary: 170 mg/dL — ABNORMAL HIGH (ref 65–99)

## 2016-06-15 LAB — CBC
HEMATOCRIT: 29.5 % — AB (ref 36.0–46.0)
Hemoglobin: 9.4 g/dL — ABNORMAL LOW (ref 12.0–15.0)
MCH: 27.6 pg (ref 26.0–34.0)
MCHC: 31.9 g/dL (ref 30.0–36.0)
MCV: 86.8 fL (ref 78.0–100.0)
PLATELETS: 201 10*3/uL (ref 150–400)
RBC: 3.4 MIL/uL — ABNORMAL LOW (ref 3.87–5.11)
RDW: 15.5 % (ref 11.5–15.5)
WBC: 15.7 10*3/uL — ABNORMAL HIGH (ref 4.0–10.5)

## 2016-06-15 LAB — RENAL FUNCTION PANEL
ALBUMIN: 2.3 g/dL — AB (ref 3.5–5.0)
Anion gap: 19 — ABNORMAL HIGH (ref 5–15)
BUN: 111 mg/dL — AB (ref 6–20)
CALCIUM: 8.7 mg/dL — AB (ref 8.9–10.3)
CO2: 29 mmol/L (ref 22–32)
CREATININE: 3.63 mg/dL — AB (ref 0.44–1.00)
Chloride: 104 mmol/L (ref 101–111)
GFR calc Af Amer: 14 mL/min — ABNORMAL LOW (ref >60)
GFR calc non Af Amer: 12 mL/min — ABNORMAL LOW (ref >60)
GLUCOSE: 114 mg/dL — AB (ref 65–99)
PHOSPHORUS: 7.1 mg/dL — AB (ref 2.5–4.6)
Potassium: 3 mmol/L — ABNORMAL LOW (ref 3.5–5.1)
SODIUM: 152 mmol/L — AB (ref 135–145)

## 2016-06-15 LAB — PHOSPHORUS: PHOSPHORUS: 7.1 mg/dL — AB (ref 2.5–4.6)

## 2016-06-15 LAB — MAGNESIUM: Magnesium: 2.2 mg/dL (ref 1.7–2.4)

## 2016-06-15 MED ORDER — DEXTROSE 5 % IV SOLN
INTRAVENOUS | Status: DC
  Administered 2016-06-15 – 2016-06-20 (×9): via INTRAVENOUS

## 2016-06-15 MED ORDER — POTASSIUM CHLORIDE 10 MEQ/50ML IV SOLN
10.0000 meq | INTRAVENOUS | Status: AC
  Administered 2016-06-15 (×4): 10 meq via INTRAVENOUS
  Filled 2016-06-15 (×8): qty 50

## 2016-06-15 MED ORDER — AMIODARONE HCL IN DEXTROSE 360-4.14 MG/200ML-% IV SOLN
30.0000 mg/h | INTRAVENOUS | Status: DC
  Administered 2016-06-15 – 2016-06-20 (×11): 30 mg/h via INTRAVENOUS
  Filled 2016-06-15 (×16): qty 200

## 2016-06-15 MED ORDER — METOPROLOL TARTRATE 5 MG/5ML IV SOLN
5.0000 mg | Freq: Four times a day (QID) | INTRAVENOUS | Status: DC
  Administered 2016-06-15 – 2016-06-20 (×21): 5 mg via INTRAVENOUS
  Filled 2016-06-15 (×23): qty 5

## 2016-06-15 NOTE — Progress Notes (Signed)
PULMONARY / CRITICAL CARE MEDICINE   Name: Latoya [name] MRN: [id number] DOB: 01/14/1946    ADMISSION DATE:  06/04/2016  CHIEF COMPLAINT:  Abdominal Pain  HISTORY OF PRESENT ILLNESS:   [name] is a 70 y/o woman with MMP, most notable for antiphospholipid syndrome on AC (hx of one provoked DVT), as well as CAD and asthma who presented to the ED with sudden onset of chills and abdominal pain. In the ED her clinical status declined, becoming less responsive, more tachycardic and more tachypneic. Imaging studies suggested an enlarged gallbladder.  SUBJECTIVE:  Off morphine drip and venti mask. Maintained on HFNC.  VITAL SIGNS: BP (!) 127/56   Pulse (!) 102   Temp 98.6 F (37 C)   Resp 11   Ht 5' (1.524 m)   Wt 170 lb 10.2 oz (77.4 kg)   SpO2 98%   BMI 33.33 kg/m    HEMODYNAMICS: CVP:  [5 mmHg-6 mmHg] 5 mmHg  VENTILATOR SETTINGS:    INTAKE / OUTPUT: I/O last 3 completed shifts: In: 1771.1 [I.V.:1331.1; NG/GT:240; IV [id number]:768] Out: 6100 [Urine:5700; Drains:400]  PHYSICAL EXAMINATION: General:  Elderly woman, No distress  Neuro:  Awake, Follows commands. No focal deficits HEENT:  No JVD, thyromegaly Cardiovascular:  RRR, Nl S1/S2, No MRG Lungs:  resps even non labored on vent, diminished throughout, few bibasilar crackles  Abdomen:  Obese, NT, ND and +BS Musculoskeletal:  No joint swelling Skin:  No visible rashes  LABS:  BMET  Recent Labs Lab 06/13/16 0440 06/14/16 0500 06/15/16 0340  NA 150* 149* 152*  151*  K 3.5 3.2* 3.0*  3.0*  CL 106 104 104  105  CO2 '31 31 29  29  ' BUN 121* 121* 111*  111*  CREATININE 4.09* 3.91* 3.63*  3.69*  GLUCOSE 126* 128* 114*  116*   Electrolytes  Recent Labs Lab 06/09/16 0405  06/13/16 0440 06/14/16 0500 06/15/16 0340  CALCIUM 7.9*  < > 8.0* 8.2* 8.7*  8.7*  MG 2.7*  --   --   --  2.2  PHOS 3.5  < > 6.7* 8.4* 7.1*  7.1*  < > = values in this interval not displayed. CBC  Recent Labs Lab  06/12/16 0345 06/13/16 0440 06/15/16 0340  WBC 13.7* 10.1 15.7*  HGB 9.3* 8.3* 9.4*  HCT 28.0* 25.3* 29.5*  PLT 145* 175 201   Coag's  Recent Labs Lab 06/14/16 0500 06/14/16 1717 06/15/16 0340  APTT 58* 64* 69*   Sepsis Markers No results for input(s): LATICACIDVEN, PROCALCITON, O2SATVEN in the last 168 hours. ABG  Recent Labs Lab 06/08/16 2320 06/09/16 0500 06/12/16 0340  PHART 7.393 7.378 7.435  PCO2ART 38.3 42.4 43.4  PO2ART 84.6 98.9 131*   Liver Enzymes  Recent Labs Lab 06/11/16 0515  06/13/16 0440 06/14/16 0500 06/15/16 0340  AST 18  --   --   --   --   ALT 34  --   --   --   --   ALKPHOS 105  --   --   --   --   BILITOT 0.7  --   --   --   --   ALBUMIN 2.0*  2.0*  < > 2.0* 2.3* 2.3*  < > = values in this interval not displayed. Cardiac Enzymes No results for input(s): TROPONINI, PROBNP in the last 168 hours. Glucose  Recent Labs Lab 06/14/16 1203 06/14/16 1645 06/14/16 1948 06/15/16 0008 06/15/16 0317 06/15/16 0734  GLUCAP 108* 98 86 101*  116* 102*   Imaging Dg Chest Port 1 View  Result Date: 06/15/2016 CLINICAL DATA:  70 y/o  F; acute respiratory failure. EXAM: PORTABLE CHEST 1 VIEW COMPARISON:  06/14/2016 chest radiograph. FINDINGS: Mild interval improvement of the lungs bilaterally. Persistent diffuse patchy opacities of left greater than right lungs may represent pulmonary edema or pneumonia. Right central venous catheter tip projects over mid SVC. Cervical fusion hardware and thoracolumbar partially visualized fusion mass noted. Stable probable small effusions. IMPRESSION: Mild improvement in aeration of the lungs. Persistent opacities may represent edema or pneumonia. Stable probable small effusions. Electronically Signed   By: Kristine Garbe M.D.   On: 06/15/2016 06:35     STUDIES:  CTA ABD with enlarged galbladder and enlarging hepatic cyst. RUQ [place] noted  CULTURES: Blood 8/14 >>E. Coli and enterococcous and  klebsiella. Urine 8/14 >>NTD. Abscess 8/14>>>E.Coli and enterococcus (both pan sens)   ANTIBIOTICS: Vanc 8/14 >> 8/16 Zosyn 8/14 >>8/16 Unasyn 8/16>>> 8/21  SIGNIFICANT EVENTS: Intubated in ED after initial evaluation IR perc drain 8/14  LINES/TUBES: ETT 7.5 mm 8/14>>>8/18, 8/18>>> 8/24 Foley 8/14>>> R IJ TLC 8/14>>> Gall bladder drain (IR) 8/14>>>  DISCUSSION: 70 y/o woman with respiratory failure, severe sepsis of presumed biliary origin and AKI. Extubated 8/23. Slowly improving resp status.   ASSESSMENT / PLAN:  PULMONARY A: Acute hypoxic respiratory failure with pulmonary edema and possible aspiration. Moderate persistent asthma P:   Extubated.  Budesonide/brovana with prn xopenex  CARDIOVASCULAR A:  CAD. HTN. Evolving septic shock - improved.  A-fib with RVR. P:  Amio IV for a fib lopressor IV  RENAL A:   Lactic Acidosis Acute oliguric renal failure  - oliguric in setting septic shock - Scr still rising slowly but UOP improving with lasix  Hypokalemia  P:   Start D5W at 75 for hypernatremia Good urine output. Continue lasix Replete K No acute indication for dialysis and family does not want dialysis. Cr is improving.  GASTROINTESTINAL A:   Abnormal LFTs Probable cholangitis P:   Drain placed in by IR. Swallow eval > She has increasing strength. Hopeful to be cleared for PO soon.  Keep NPO No MRCP due to large burden of metallic objects in the patient's body  ?further intervention needed - surgery following peripherally   HEMATOLOGIC A:   Hx of Antiphospholipid Ab Syndrome Hx of provoked DVT On rivaroxaban Hx of allergy to Heparin products P:  Bivalirudin per pharmacy  CBC in AM. Monitor coags.  INFECTIOUS A:   Severe sepsis, probable biliary origin P:   Finished 10 days abx PTC per IR.  ENDOCRINE A:   Hypothyroidism P:   Cont home syndroid. TSH ok  NEUROLOGIC A:   Off sedation P:   Minimize sedating meds  FAMILY  -  Updates: Husband updated. I had a long discussion with him 8/25 and again updated him on 8/25. Surprisingly Latoya [name] has pulled through the night and she looks better today. I think we should give her a chance to pull through this. I will not withdraw care on her. We will continue all medical management. She is still DNR, DNI and no dialysis. If she deteriorates again then she will be full comfort only. - Inter-disciplinary family meet or Palliative Care meeting due by:  8/21.  Marshell Garfinkel MD [facility] Pager [phone] If no answer or after 3pm call: [phone] 06/15/2016, 11:23 AM

## 2016-06-15 NOTE — Progress Notes (Signed)
S: Breathing better wo face mask O:BP 133/67   Pulse (!) 110   Temp 98.4 F (36.9 C)   Resp 20   Ht 5' (1.524 m)   Wt 77.4 kg (170 lb 10.2 oz)   SpO2 99%   BMI 33.33 kg/m   Intake/Output Summary (Last 24 hours) at 06/15/16 0732 Last data filed at 06/15/16 0600  Gross per 24 hour  Intake          1003.28 ml  Output             4600 ml  Net         -3596.72 ml   Weight change: -5.2 kg (-11 lb 7.4 oz) Gen: Awake and alert CVS: irreg, irreg, tachy Resp: Basilar crackles Abd: + BS Soft, mild upper quadrant tenderness Ext:  No edema legs  + edema hands NEURO: Moves all extremities, Ox3   . amiodarone  100 mg Oral BID  . antiseptic oral rinse  7 mL Mouth Rinse Q4H  . arformoterol  15 mcg Nebulization BID  . budesonide (PULMICORT) nebulizer solution  0.5 mg Nebulization BID  . famotidine (PEPCID) IV  20 mg Intravenous Q24H  . feeding supplement (VITAL HIGH PROTEIN)  1,000 mL Per Tube Q24H  . free water  200 mL Per Tube Q6H  . furosemide  80 mg Intravenous Q12H  . insulin aspart  0-9 Units Subcutaneous Q4H  . levothyroxine  37 mcg Intravenous Daily  . metoprolol  2.5 mg Intravenous Q6H  . multivitamin  15 mL Oral Daily  . potassium chloride  10 mEq Intravenous Q1 Hr x 4   Dg Chest Port 1 View  Result Date: 06/15/2016 CLINICAL DATA:  70 y/o  F; acute respiratory failure. EXAM: PORTABLE CHEST 1 VIEW COMPARISON:  06/14/2016 chest radiograph. FINDINGS: Mild interval improvement of the lungs bilaterally. Persistent diffuse patchy opacities of left greater than right lungs may represent pulmonary edema or pneumonia. Right central venous catheter tip projects over mid SVC. Cervical fusion hardware and thoracolumbar partially visualized fusion mass noted. Stable probable small effusions. IMPRESSION: Mild improvement in aeration of the lungs. Persistent opacities may represent edema or pneumonia. Stable probable small effusions. Electronically Signed   By: Kristine Garbe M.D.    On: 06/15/2016 06:35   Dg Chest Port 1 View  Result Date: 06/14/2016 CLINICAL DATA:  70 y/o  F; acute respiratory failure. EXAM: PORTABLE CHEST 1 VIEW COMPARISON:  Chest radiograph 06/12/2016. FINDINGS: Right central venous catheter tip projects over lower SVC. Interval removal of enteric tube. Worsening airspace opacities diffusely and small effusions probably representing pulmonary edema, possibly pneumonia. Surgical hardware partially visualized of cervical and thoracolumbar spinal fusion. No acute osseous abnormality. IMPRESSION: Worsening diffuse airspace opacities and small pleural effusions probably representing pulmonary edema, possibly pneumonia. Electronically Signed   By: Kristine Garbe M.D.   On: 06/14/2016 05:45   BMET    Component Value Date/Time   NA 152 (H) 06/15/2016 0340   NA 151 (H) 06/15/2016 0340   K 3.0 (L) 06/15/2016 0340   K 3.0 (L) 06/15/2016 0340   CL 104 06/15/2016 0340   CL 105 06/15/2016 0340   CO2 29 06/15/2016 0340   CO2 29 06/15/2016 0340   GLUCOSE 114 (H) 06/15/2016 0340   GLUCOSE 116 (H) 06/15/2016 0340   BUN 111 (H) 06/15/2016 0340   BUN 111 (H) 06/15/2016 0340   CREATININE 3.63 (H) 06/15/2016 0340   CREATININE 3.69 (H) 06/15/2016 0340   CREATININE 0.50 02/12/2013  1831   CALCIUM 8.7 (L) 06/15/2016 0340   CALCIUM 8.7 (L) 06/15/2016 0340   GFRNONAA 12 (L) 06/15/2016 0340   GFRNONAA 12 (L) 06/15/2016 0340   GFRAA 14 (L) 06/15/2016 0340   GFRAA 13 (L) 06/15/2016 0340   CBC    Component Value Date/Time   WBC 15.7 (H) 06/15/2016 0340   RBC 3.40 (L) 06/15/2016 0340   HGB 9.4 (L) 06/15/2016 0340   HCT 29.5 (L) 06/15/2016 0340   PLT 201 06/15/2016 0340   MCV 86.8 06/15/2016 0340   MCV 85.6 12/30/2014 1756   MCH 27.6 06/15/2016 0340   MCHC 31.9 06/15/2016 0340   RDW 15.5 06/15/2016 0340   LYMPHSABS 1.8 07/22/2014 1624   MONOABS 0.4 07/22/2014 1624   EOSABS 0.1 07/22/2014 1624   BASOSABS 0.0 07/22/2014 1624     Assessment:  1.   ARF secondary to Gm neg sepsis, hypotension and IV contrast, non oliguric.  Scr sl better 2. Enterococcal/E coli sepsis probably secondary to cholecystitis 3. Hypokalemia 4. Hypernatremia 5. A fib, paroxysmal.  Now NSR  Plan: 1.  Renal fx cont to improve.  I would be careful with lasix given her hyperNa 2. Recheck labs in AM 3. Note K being replaced   Justise Ehmann T

## 2016-06-15 NOTE — Progress Notes (Signed)
[facility] for Bivalirudin  Indication: atrial fibrillation   Allergies  Allergen Reactions  . Hornet Venom Anaphylaxis  . Pork-Derived Products Anaphylaxis  . Chlorhexidine Itching    Reports that it is the dye in it  . Enoxaparin Sodium     REACTION: thrombocytopenia  . Heparin     REACTION: thrombocytopenia  . Lactose Intolerance (Gi)   . Other     Sensitive to dye in Betadine & Chlorohexadine   . Povidone-Iodine     Sensitivity- but if its wiped off she is able to tolerate betadine   . Indomethacin Rash  . Phenylpropanolamine Rash    Other Reaction: other reaction  . Povidone Iodine Rash    Patient Measurements: Height: 5' (152.4 cm) Weight: 170 lb 10.2 oz (77.4 kg) IBW/kg (Calculated) : 45.5  Vital Signs: Temp: 98.6 F (37 C) (08/25 0700) Temp Source: Core (Comment) (08/25 0400) BP: 127/56 (08/25 0700) Pulse Rate: 102 (08/25 0700)  Labs:  Recent Labs  06/13/16 0440 06/14/16 0500 06/14/16 1717 06/15/16 0340  HGB 8.3*  --   --  9.4*  HCT 25.3*  --   --  29.5*  PLT 175  --   --  201  APTT 60* 58* 64* 69*  CREATININE 4.09* 3.91*  --  3.63*  3.69*    Estimated Creatinine Clearance: 13.5 mL/min (by C-G formula based on SCr of 3.63 mg/dL).   Assessment: Patient is a 70 yo female who presented 8/14 with sepsis of presumed biliary origin. She was on rivaroxaban PTA for AFib with last dose 8/13PM. She also has h/o antiphospholipid syndrome w/ h/o DVT/PE. She has documented allergy to both heparin and enoxaparin. LFTs and T bili were elevated on admission, therefore not using argatroban.   APTT is therapeutic today at 69s. Hemoglobin and platelet count stable. Renal function is slowly improving.  Goal of Therapy:  Goal aPTT: 50-85 seconds Monitor platelets by anticoagulation protocol: Yes   Plan:  -Continue bivalirudin 0.05 mg / kg / hr IV infusion -Monitor aPTT every 12 hours while renal function changing -Monitor  CBC, renal function -Follow up ability to transition back to oral anticoagulation when appropriate   Demetrius Charity, PharmD Acute Care Pharmacy Resident  Pager: [phone] 06/15/2016

## 2016-06-15 NOTE — Progress Notes (Signed)
[place] Progress Note Patient Name: Latoya [name] [facility] DOB: [birth year] MRN: [id number]   Date of Service  06/15/2016  HPI/Events of Note  Hypokalemia  eICU Interventions  Potassium replaced     Intervention Category Intermediate Interventions: Electrolyte abnormality - evaluation and management  Mical Brun 06/15/2016, 4:18 AM

## 2016-06-15 NOTE — Progress Notes (Signed)
Speech Language Pathology Treatment: Dysphagia  Patient Details Name: Latoya [name] MRN: [id number] DOB: November 19, 1945 Today's Date: 06/15/2016 Time: 0927-0958 SLP Time Calculation (min) (ACUTE ONLY): 31 min  Assessment / Plan / Recommendation Clinical Impression  Today pt is on nasal cannula with SpO2 remaining at 100% throughout trials, although RR intermittently fluctuates into the low 30s. Vocal quality remains hoarse but with increased vocal intensity and strength of cough. Ice chip trials elicit delayed coughing which is productive of blood-tinged secretions. Overall she shows some improvements from yesterday but she is not yet ready for PO diet. Will continue to follow for readiness. Continue to anticipate good prognosis with increased time post-extubation.   HPI HPI: 71 y/o woman with respiratory failure, severe sepsis of presumed biliary origin and AKI. She required intubation 8/14-8/18, and again 8/18-8/23. PMH: PE, PAF, HTN, HLD, HH, GERD, CAD, asthma, spondylosis requiring cervical fusion C6-C7, and cleft lip/partial palate s/p surgical intervention as a child      SLP Plan  Continue with current plan of care     Recommendations  Diet recommendations: NPO Medication Administration: Via alternative means             Oral Care Recommendations: Oral care QID Follow up Recommendations:  (tba) Plan: Continue with current plan of care     GO                Germain Osgood 06/15/2016, 10:06 AM  Germain Osgood, M.A. CCC-SLP [phone]

## 2016-06-15 NOTE — Progress Notes (Signed)
[facility] for Bivalirudin  Indication: atrial fibrillation   Allergies  Allergen Reactions  . Hornet Venom Anaphylaxis  . Pork-Derived Products Anaphylaxis  . Chlorhexidine Itching    Reports that it is the dye in it  . Enoxaparin Sodium     REACTION: thrombocytopenia  . Heparin     REACTION: thrombocytopenia  . Lactose Intolerance (Gi)   . Other     Sensitive to dye in Betadine & Chlorohexadine   . Povidone-Iodine     Sensitivity- but if its wiped off she is able to tolerate betadine   . Indomethacin Rash  . Phenylpropanolamine Rash    Other Reaction: other reaction  . Povidone Iodine Rash    Patient Measurements: Height: 5' (152.4 cm) Weight: 170 lb 10.2 oz (77.4 kg) IBW/kg (Calculated) : 45.5  Vital Signs: Temp: 98.1 F (36.7 C) (08/25 1400) Temp Source: Core (Comment) (08/25 1200) BP: 172/68 (08/25 1900) Pulse Rate: 82 (08/25 1900)  Labs:  Recent Labs  06/13/16 0440 06/14/16 0500 06/14/16 1717 06/15/16 0340 06/15/16 1700  HGB 8.3*  --   --  9.4*  --   HCT 25.3*  --   --  29.5*  --   PLT 175  --   --  201  --   APTT 60* 58* 64* 69* 83*  CREATININE 4.09* 3.91*  --  3.63*  3.69*  --     Estimated Creatinine Clearance: 13.5 mL/min (by C-G formula based on SCr of 3.63 mg/dL).   Assessment: Patient is a 70 yo female who presented 8/14 with sepsis of presumed biliary origin. She was on rivaroxaban PTA for AFib with last dose 8/13PM. She also has h/o antiphospholipid syndrome w/ h/o DVT/PE. She has documented allergy to both heparin and enoxaparin. LFTs and T bili were elevated on admission, therefore not using argatroban.   APTT continues on the higher end of goal  Goal of Therapy:  Goal aPTT: 50-85 seconds Monitor platelets by anticoagulation protocol: Yes   Plan: aPTT= 83 -Continue bivalirudin 0.05 mg / kg / hr IV infusion -Monitor aPTT every 12 hours   Hildred Laser, Pharm D 06/15/2016 7:33 PM

## 2016-06-16 ENCOUNTER — Inpatient Hospital Stay (HOSPITAL_COMMUNITY): Payer: Medicare Other

## 2016-06-16 LAB — RENAL FUNCTION PANEL
ALBUMIN: 2.4 g/dL — AB (ref 3.5–5.0)
Anion gap: 13 (ref 5–15)
BUN: 93 mg/dL — AB (ref 6–20)
CALCIUM: 9.1 mg/dL (ref 8.9–10.3)
CO2: 35 mmol/L — ABNORMAL HIGH (ref 22–32)
CREATININE: 3.24 mg/dL — AB (ref 0.44–1.00)
Chloride: 101 mmol/L (ref 101–111)
GFR, EST AFRICAN AMERICAN: 16 mL/min — AB (ref >60)
GFR, EST NON AFRICAN AMERICAN: 14 mL/min — AB (ref >60)
Glucose, Bld: 147 mg/dL — ABNORMAL HIGH (ref 65–99)
PHOSPHORUS: 5.3 mg/dL — AB (ref 2.5–4.6)
Potassium: 2.4 mmol/L — CL (ref 3.5–5.1)
SODIUM: 149 mmol/L — AB (ref 135–145)

## 2016-06-16 LAB — CBC
HEMATOCRIT: 27.6 % — AB (ref 36.0–46.0)
Hemoglobin: 8.7 g/dL — ABNORMAL LOW (ref 12.0–15.0)
MCH: 27.3 pg (ref 26.0–34.0)
MCHC: 31.5 g/dL (ref 30.0–36.0)
MCV: 86.5 fL (ref 78.0–100.0)
PLATELETS: 190 10*3/uL (ref 150–400)
RBC: 3.19 MIL/uL — ABNORMAL LOW (ref 3.87–5.11)
RDW: 15.3 % (ref 11.5–15.5)
WBC: 12.9 10*3/uL — AB (ref 4.0–10.5)

## 2016-06-16 LAB — GLUCOSE, CAPILLARY
GLUCOSE-CAPILLARY: 118 mg/dL — AB (ref 65–99)
GLUCOSE-CAPILLARY: 121 mg/dL — AB (ref 65–99)
GLUCOSE-CAPILLARY: 141 mg/dL — AB (ref 65–99)
GLUCOSE-CAPILLARY: 143 mg/dL — AB (ref 65–99)
GLUCOSE-CAPILLARY: 146 mg/dL — AB (ref 65–99)
Glucose-Capillary: 127 mg/dL — ABNORMAL HIGH (ref 65–99)
Glucose-Capillary: 147 mg/dL — ABNORMAL HIGH (ref 65–99)

## 2016-06-16 LAB — APTT
APTT: 82 s — AB (ref 24–36)
APTT: 73 s — AB (ref 24–36)

## 2016-06-16 MED ORDER — ONDANSETRON HCL 4 MG/2ML IJ SOLN
4.0000 mg | Freq: Four times a day (QID) | INTRAMUSCULAR | Status: DC | PRN
  Administered 2016-06-16 – 2016-06-21 (×8): 4 mg via INTRAVENOUS
  Filled 2016-06-16 (×8): qty 2

## 2016-06-16 MED ORDER — ORAL CARE MOUTH RINSE
15.0000 mL | Freq: Two times a day (BID) | OROMUCOSAL | Status: DC
  Administered 2016-06-17 – 2016-06-21 (×6): 15 mL via OROMUCOSAL

## 2016-06-16 MED ORDER — POTASSIUM CHLORIDE 10 MEQ/50ML IV SOLN
10.0000 meq | INTRAVENOUS | Status: AC
Start: 2016-06-16 — End: 2016-06-16
  Administered 2016-06-16 (×4): 10 meq via INTRAVENOUS
  Filled 2016-06-16 (×4): qty 50

## 2016-06-16 NOTE — Progress Notes (Signed)
S: Abd pain better O:BP (!) 138/57   Pulse 61   Temp 98.1 F (36.7 C)   Resp 12   Ht 5' (1.524 m)   Wt 76.6 kg (168 lb 14 oz)   SpO2 100%   BMI 32.98 kg/m   Intake/Output Summary (Last 24 hours) at 06/16/16 0737 Last data filed at 06/16/16 0700  Gross per 24 hour  Intake          2125.59 ml  Output             4400 ml  Net         -2274.41 ml   Weight change: -0.8 kg (-1 lb 12.2 oz) Gen: Awake and alert CVS: RRR Resp: Basilar crackles Abd: + BS Soft, mild upper quadrant tenderness Ext:  No edema legs  + edema hands NEURO: Moves all extremities, Ox3   . antiseptic oral rinse  7 mL Mouth Rinse Q4H  . arformoterol  15 mcg Nebulization BID  . budesonide (PULMICORT) nebulizer solution  0.5 mg Nebulization BID  . famotidine (PEPCID) IV  20 mg Intravenous Q24H  . furosemide  80 mg Intravenous Q12H  . insulin aspart  0-9 Units Subcutaneous Q4H  . levothyroxine  37 mcg Intravenous Daily  . metoprolol  5 mg Intravenous Q6H  . potassium chloride  10 mEq Intravenous Q1 Hr x 4   Dg Chest Port 1 View  Result Date: 06/15/2016 CLINICAL DATA:  70 y/o  F; acute respiratory failure. EXAM: PORTABLE CHEST 1 VIEW COMPARISON:  06/14/2016 chest radiograph. FINDINGS: Mild interval improvement of the lungs bilaterally. Persistent diffuse patchy opacities of left greater than right lungs may represent pulmonary edema or pneumonia. Right central venous catheter tip projects over mid SVC. Cervical fusion hardware and thoracolumbar partially visualized fusion mass noted. Stable probable small effusions. IMPRESSION: Mild improvement in aeration of the lungs. Persistent opacities may represent edema or pneumonia. Stable probable small effusions. Electronically Signed   By: Kristine Garbe M.D.   On: 06/15/2016 06:35   BMET    Component Value Date/Time   NA 149 (H) 06/16/2016 0444   K 2.4 (LL) 06/16/2016 0444   CL 101 06/16/2016 0444   CO2 35 (H) 06/16/2016 0444   GLUCOSE 147 (H) 06/16/2016  0444   BUN 93 (H) 06/16/2016 0444   CREATININE 3.24 (H) 06/16/2016 0444   CREATININE 0.50 02/12/2013 1831   CALCIUM 9.1 06/16/2016 0444   GFRNONAA 14 (L) 06/16/2016 0444   GFRAA 16 (L) 06/16/2016 0444   CBC    Component Value Date/Time   WBC 12.9 (H) 06/16/2016 0445   RBC 3.19 (L) 06/16/2016 0445   HGB 8.7 (L) 06/16/2016 0445   HCT 27.6 (L) 06/16/2016 0445   PLT 190 06/16/2016 0445   MCV 86.5 06/16/2016 0445   MCV 85.6 12/30/2014 1756   MCH 27.3 06/16/2016 0445   MCHC 31.5 06/16/2016 0445   RDW 15.3 06/16/2016 0445   LYMPHSABS 1.8 07/22/2014 1624   MONOABS 0.4 07/22/2014 1624   EOSABS 0.1 07/22/2014 1624   BASOSABS 0.0 07/22/2014 1624     Assessment:  1.  ARF secondary to Gm neg sepsis, hypotension and IV contrast, non oliguric.  Scr sl better 2. Enterococcal/E coli sepsis probably secondary to cholecystitis 3. Hypokalemia 4. Hypernatremia on D5W and lasix 5. A fib, paroxysmal.  Now NSR  Plan: 1.  Renal fx cont to improve.   2. Recheck labs in AM 3. Note K being replaced   Ashelyn Mccravy T

## 2016-06-16 NOTE — Progress Notes (Signed)
[facility] for Bivalirudin  Indication: atrial fibrillation   Allergies  Allergen Reactions  . Hornet Venom Anaphylaxis  . Pork-Derived Products Anaphylaxis  . Chlorhexidine Itching    Reports that it is the dye in it  . Enoxaparin Sodium     REACTION: thrombocytopenia  . Heparin     REACTION: thrombocytopenia  . Lactose Intolerance (Gi)   . Other     Sensitive to dye in Betadine & Chlorohexadine   . Povidone-Iodine     Sensitivity- but if its wiped off she is able to tolerate betadine   . Indomethacin Rash  . Phenylpropanolamine Rash    Other Reaction: other reaction  . Povidone Iodine Rash    Patient Measurements: Height: 5' (152.4 cm) Weight: 168 lb 14 oz (76.6 kg) IBW/kg (Calculated) : 45.5  Vital Signs: Temp: 98.1 F (36.7 C) (08/26 0900) Temp Source: Core (Comment) (08/26 0400) BP: 150/72 (08/26 0900) Pulse Rate: 77 (08/26 0900)  Labs:  Recent Labs  06/14/16 0500  06/15/16 0340 06/15/16 1700 06/16/16 0444 06/16/16 0445  HGB  --   --  9.4*  --   --  8.7*  HCT  --   --  29.5*  --   --  27.6*  PLT  --   --  201  --   --  190  APTT 58*  < > 69* 83*  --  82*  CREATININE 3.91*  --  3.63*  3.69*  --  3.24*  --   < > = values in this interval not displayed.  Estimated Creatinine Clearance: 15 mL/min (by C-G formula based on SCr of 3.24 mg/dL).   Assessment: Patient is a 70 yo female who presented 8/14 with sepsis of presumed biliary origin. She was on rivaroxaban PTA for AFib with last dose 8/13PM. She also has h/o antiphospholipid syndrome w/ h/o DVT/PE. She has documented allergy to both heparin and enoxaparin. LFTs and T bili were elevated on admission, therefore not using argatroban.   APTT today is 82 (therapeutic, higher end of goal). H/H stable, Plt wnl.  Goal of Therapy:  Goal aPTT: 50-85 seconds Monitor platelets by anticoagulation protocol: Yes   Plan:  Continue bivalirudin 0.05 mg / kg / hr IV  infusion Monitor aPTT every 12 hours  Monitor CBC and s/sx of bleeding  Dimitri Ped, PharmD. PGY-2 Pharmacy Resident Pager: [phone] 06/16/2016 9:43 AM

## 2016-06-16 NOTE — Progress Notes (Signed)
PULMONARY / CRITICAL CARE MEDICINE   Name: Latoya [name] MRN: [id number] DOB: 11-Mar-1946    ADMISSION DATE:  06/04/2016  CHIEF COMPLAINT:  Abdominal Pain  HISTORY OF PRESENT ILLNESS:   Latoya [name] is a 70 y/o woman with MMP, most notable for antiphospholipid syndrome on AC (hx of one provoked DVT), as well as CAD and asthma who presented to the ED with sudden onset of chills and abdominal pain. In the ED her clinical status declined, becoming less responsive, more tachycardic and more tachypneic. Imaging studies suggested an enlarged gallbladder.  SUBJECTIVE:  No events overnight, finally able to sleep.  Off morphine drip.  VITAL SIGNS: BP (!) 153/71   Pulse 86   Temp 97.7 F (36.5 C)   Resp 20   Ht 5' (1.524 m)   Wt 76.6 kg (168 lb 14 oz)   SpO2 100%   BMI 32.98 kg/m    HEMODYNAMICS:    VENTILATOR SETTINGS:    INTAKE / OUTPUT: I/O last 3 completed shifts: In: 2716.5 [I.V.:2466.5; IV Piggyback:250] Out: 7000 [Urine:5550; Drains:1450]  PHYSICAL EXAMINATION: General:  Elderly woman, No distress, sleeping but arousable. Neuro:  Follows commands. No focal deficits HEENT:  No JVD, thyromegaly Cardiovascular:  RRR, Nl S1/S2, No MRG Lungs:  Resps even non labored on vent, diminished throughout, few bibasilar crackles  Abdomen:  Obese, NT, ND and +BS Musculoskeletal:  No joint swelling Skin:  No visible rashes  LABS:  BMET  Recent Labs Lab 06/14/16 0500 06/15/16 0340 06/16/16 0444  NA 149* 152*  151* 149*  K 3.2* 3.0*  3.0* 2.4*  CL 104 104  105 101  CO2 '31 29  29 ' 35*  BUN 121* 111*  111* 93*  CREATININE 3.91* 3.63*  3.69* 3.24*  GLUCOSE 128* 114*  116* 147*   Electrolytes  Recent Labs Lab 06/14/16 0500 06/15/16 0340 06/16/16 0444  CALCIUM 8.2* 8.7*  8.7* 9.1  MG  --  2.2  --   PHOS 8.4* 7.1*  7.1* 5.3*   CBC  Recent Labs Lab 06/13/16 0440 06/15/16 0340 06/16/16 0445  WBC 10.1 15.7* 12.9*  HGB 8.3* 9.4* 8.7*  HCT 25.3* 29.5* 27.6*   PLT 175 201 190   Coag's  Recent Labs Lab 06/15/16 0340 06/15/16 1700 06/16/16 0445  APTT 69* 83* 82*   Sepsis Markers No results for input(s): LATICACIDVEN, PROCALCITON, O2SATVEN in the last 168 hours. ABG  Recent Labs Lab 06/12/16 0340  PHART 7.435  PCO2ART 43.4  PO2ART 131*   Liver Enzymes  Recent Labs Lab 06/11/16 0515  06/14/16 0500 06/15/16 0340 06/16/16 0444  AST 18  --   --   --   --   ALT 34  --   --   --   --   ALKPHOS 105  --   --   --   --   BILITOT 0.7  --   --   --   --   ALBUMIN 2.0*  2.0*  < > 2.3* 2.3* 2.4*  < > = values in this interval not displayed. Cardiac Enzymes No results for input(s): TROPONINI, PROBNP in the last 168 hours. Glucose  Recent Labs Lab 06/15/16 1537 06/15/16 2004 06/16/16 0007 06/16/16 0334 06/16/16 0807 06/16/16 1212  GLUCAP 164* 170* 146* 127* 141* 143*   Imaging Dg Chest Port 1 View  Result Date: 06/16/2016 CLINICAL DATA:  Acute respiratory failure EXAM: PORTABLE CHEST 1 VIEW COMPARISON:  June 15, 2016 FINDINGS: The heart size and mediastinal contours  are stable. Patchy consolidation throughout bilateral lungs are identified unchanged compared prior exam. There is probably small left pleural effusion unchanged. Right central venous line is identified distal tip in the superior vena cava unchanged. The visualized skeletal structures are stable. IMPRESSION: Persistent patchy consolidation throughout bilateral lungs unchanged can be due to either edema or pneumonia. Persistent small left pleural effusion. Electronically Signed   By: Abelardo Diesel M.D.   On: 06/16/2016 07:35     STUDIES:  CTA ABD with enlarged galbladder and enlarging hepatic cyst. RUQ [place] noted  CULTURES: Blood 8/14 >>E. Coli and enterococcous and klebsiella. Urine 8/14 >>NTD. Abscess 8/14>>>E.Coli and enterococcus (both pan sens)   ANTIBIOTICS: Vanc 8/14 >> 8/16 Zosyn 8/14 >>8/16 Unasyn 8/16>>> 8/21  SIGNIFICANT EVENTS: Intubated in ED  after initial evaluation IR perc drain 8/14  LINES/TUBES: ETT 7.5 mm 8/14>>>8/18, 8/18>>> 8/24 Foley 8/14>>> R IJ TLC 8/14>>> Gall bladder drain (IR) 8/14>>>  I reviewed CXR myself, persistent but improving patch infiltrate.   DISCUSSION: 70 y/o woman with respiratory failure, severe sepsis of presumed biliary origin and AKI. Extubated 8/23. Slowly improving resp status.   ASSESSMENT / PLAN:  PULMONARY A: Acute hypoxic respiratory failure with pulmonary edema and possible aspiration. Moderate persistent asthma P:   Extubated.  Budesonide/brovana with prn xopenex Monit for airway protection.  CARDIOVASCULAR A:  CAD. HTN. Evolving septic shock - improved.  A-fib with RVR. P:  Amio IV for a fib, when able to take PO will change to PO amio. Lopressor IV  RENAL A:   Lactic Acidosis Acute oliguric renal failure  - oliguric in setting septic shock - Scr still rising slowly but UOP improving with lasix  Hypokalemia  P:   D5W at 75 for hypernatremia Good urine output. Continue lasix Replete K No acute indication for dialysis and family does not want dialysis. UOP is improving.  GASTROINTESTINAL A:   Abnormal LFTs Probable cholangitis P:   Drain placed in by IR. SLP per speech pathology. NPO until cleared. No MRCP due to large burden of metallic objects in the patient's body  ?further intervention needed - surgery following peripherally   HEMATOLOGIC A:   Hx of Antiphospholipid Ab Syndrome Hx of provoked DVT On rivaroxaban Hx of allergy to Heparin products P:  Bivalirudin per pharmacy  CBC in AM. Monitor coags.  INFECTIOUS A:   Severe sepsis, probable biliary origin P:   Finished 10 days abx PTC per IR.  ENDOCRINE A:   Hypothyroidism P:   Cont home syndroid. TSH ok  NEUROLOGIC A:   Off sedation P:   Minimize sedating meds  FAMILY  - Updates: Husband updated bedside.  Patient is making improvement.  PCCM signing off.  Discussed with  PCCM-NP and bedside RN.  Rush Farmer, M.D. [facility] Pulmonary/Critical Care Medicine. Pager: [phone]. After hours pager: [phone].

## 2016-06-16 NOTE — Progress Notes (Signed)
CRITICAL VALUE ALERT  Critical value received:  K 2.4  Date of notification:  06/16/2016  Time of notification:  05:29  Critical value read back:Yes.    Nurse who received alert:  BShepherd, RN  MD notified (1st page):  [place], V.  Time of first page:  05:30  MD notified (2nd page):  Time of second page:  Responding MD:  Governor Rooks.  Time MD responded:  05:30

## 2016-06-16 NOTE — Progress Notes (Signed)
Sterile water Humidity provided for HFNC. Pt is on 5L and tolerating it well, pt is stable at this time sleeping comfortably no complications or distress noted.

## 2016-06-16 NOTE — Progress Notes (Signed)
Speech Language Pathology Treatment: Dysphagia  Patient Details Name: Latoya [name] MRN: [id number] DOB: [birth year] Today's Date: 06/16/2016 Time: 0910-0950 SLP Time Calculation (min) (ACUTE ONLY): 40 min  Assessment / Plan / Recommendation Clinical Impression  Pt continues with improvement in respiratory function and vocal quality today.  She does admit to no hoarseness prior to hospital admit but baseline swallow deficits requiring her to "be careful" with intake.    Sluggish but bilateral palatal elevation noted.  Pt observed with ice chips, nectar, thin and puree.  Swallow was timely but pt presents with decreased laryngeal elevation palpated at bedside with swallow.  Sensation of pharyngeal residuals reported with puree requiring pt to consume liquids.  Intermittent coughing/wet voice noted with approximately 24% of boluses - uncertain if due to pharyngeal secretions mixing with residuals or aspiration/penetration related.  Discussed options with pt and concerns and she prefers MBS to assure swallow.  Pt with improvement with swallowing and respiratory status at this time but with intubation x2 during this hospitalization, baseline mild dysphagia, cleft palate/lip, cervical spine surgery and esophageal dysphagia - would advise to proceed with MBS prior to po administration.  Recommend pt be allowed ice chips pending MBS.     HPI HPI: 70 y/o woman with respiratory failure, severe sepsis of presumed biliary origin and AKI. She required intubation 8/14-8/18, and again 8/18-8/23. PMH: PE, PAF, HTN, HLD, HH, GERD, CAD, asthma, spondylosis requiring cervical fusion C6-C7, and cleft lip/partial palate s/p surgical intervention as a child      SLP Plan  MBS     Recommendations  Diet recommendations: NPO (ice chips) Compensations: Slow rate (single ice chip boluses) Postural Changes and/or Swallow Maneuvers: Seated upright 90 degrees             Oral Care Recommendations: Oral care  BID Follow up Recommendations:  (tbd) Plan: MBS     GO                Claudie Fisherman, [place] [facility] SLP [phone]

## 2016-06-16 NOTE — Progress Notes (Signed)
[place] for Bivalirudin  Indication: atrial fibrillation   Allergies  Allergen Reactions  . Hornet Venom Anaphylaxis  . Pork-Derived Products Anaphylaxis  . Chlorhexidine Itching    Reports that it is the dye in it  . Enoxaparin Sodium     REACTION: thrombocytopenia  . Heparin     REACTION: thrombocytopenia  . Lactose Intolerance (Gi)   . Other     Sensitive to dye in Betadine & Chlorohexadine   . Povidone-Iodine     Sensitivity- but if its wiped off she is able to tolerate betadine   . Indomethacin Rash  . Phenylpropanolamine Rash    Other Reaction: other reaction  . Povidone Iodine Rash    Patient Measurements: Height: 5' (152.4 cm) Weight: 168 lb 14 oz (76.6 kg) IBW/kg (Calculated) : 45.5  Vital Signs: Temp: 97 F (36.1 C) (08/26 1300) BP: 136/58 (08/26 1700) Pulse Rate: 69 (08/26 1700)  Labs:  Recent Labs  06/14/16 0500  06/15/16 0340 06/15/16 1700 06/16/16 0444 06/16/16 0445 06/16/16 1726  HGB  --   --  9.4*  --   --  8.7*  --   HCT  --   --  29.5*  --   --  27.6*  --   PLT  --   --  201  --   --  190  --   APTT 58*  < > 69* 83*  --  82* 73*  CREATININE 3.91*  --  3.63*  3.69*  --  3.24*  --   --   < > = values in this interval not displayed.  Estimated Creatinine Clearance: 15 mL/min (by C-G formula based on SCr of 3.24 mg/dL).   Assessment: Patient is a 70 yo female who presented 8/14 with sepsis of presumed biliary origin. She was on rivaroxaban PTA for AFib with last dose 8/13PM. She also has h/o antiphospholipid syndrome w/ h/o DVT/PE. She has documented allergy to both heparin and enoxaparin. LFTs and T bili were elevated on admission, therefore not using argatroban.   APTT today is 82 (therapeutic, higher end of goal). H/H stable, Plt wnl. PM PTT stable at 73 seconds  Goal of Therapy:  Goal aPTT: 50-85 seconds Monitor platelets by anticoagulation protocol: Yes   Plan:  Continue bivalirudin 0.05 mg  / kg / hr IV infusion Monitor aPTT every 12 hours  Monitor CBC and s/sx of bleeding  Thank you Anette Guarneri, PharmD [phone] 06/16/2016 6:04 PM

## 2016-06-17 DIAGNOSIS — K3184 Gastroparesis: Secondary | ICD-10-CM

## 2016-06-17 DIAGNOSIS — K8309 Other cholangitis: Secondary | ICD-10-CM

## 2016-06-17 DIAGNOSIS — D1803 Hemangioma of intra-abdominal structures: Secondary | ICD-10-CM

## 2016-06-17 DIAGNOSIS — R6520 Severe sepsis without septic shock: Secondary | ICD-10-CM

## 2016-06-17 LAB — GLUCOSE, CAPILLARY
GLUCOSE-CAPILLARY: 132 mg/dL — AB (ref 65–99)
GLUCOSE-CAPILLARY: 130 mg/dL — AB (ref 65–99)
GLUCOSE-CAPILLARY: 120 mg/dL — AB (ref 65–99)
Glucose-Capillary: 117 mg/dL — ABNORMAL HIGH (ref 65–99)
Glucose-Capillary: 122 mg/dL — ABNORMAL HIGH (ref 65–99)

## 2016-06-17 LAB — APTT
APTT: 74 s — AB (ref 24–36)
APTT: 70 s — AB (ref 24–36)

## 2016-06-17 LAB — RENAL FUNCTION PANEL
ALBUMIN: 2.3 g/dL — AB (ref 3.5–5.0)
Anion gap: 10 (ref 5–15)
BUN: 76 mg/dL — AB (ref 6–20)
CALCIUM: 8.6 mg/dL — AB (ref 8.9–10.3)
CO2: 37 mmol/L — AB (ref 22–32)
CREATININE: 2.81 mg/dL — AB (ref 0.44–1.00)
Chloride: 96 mmol/L — ABNORMAL LOW (ref 101–111)
GFR calc Af Amer: 19 mL/min — ABNORMAL LOW (ref >60)
GFR calc non Af Amer: 16 mL/min — ABNORMAL LOW (ref >60)
GLUCOSE: 122 mg/dL — AB (ref 65–99)
PHOSPHORUS: 5.3 mg/dL — AB (ref 2.5–4.6)
Potassium: 2.5 mmol/L — CL (ref 3.5–5.1)
SODIUM: 143 mmol/L (ref 135–145)

## 2016-06-17 LAB — POTASSIUM: Potassium: 3.9 mmol/L (ref 3.5–5.1)

## 2016-06-17 LAB — MAGNESIUM
MAGNESIUM: 1.6 mg/dL — AB (ref 1.7–2.4)
MAGNESIUM: 1.8 mg/dL (ref 1.7–2.4)

## 2016-06-17 MED ORDER — MAGNESIUM SULFATE IN D5W 1-5 GM/100ML-% IV SOLN
1.0000 g | Freq: Once | INTRAVENOUS | Status: AC
  Administered 2016-06-17: 1 g via INTRAVENOUS
  Filled 2016-06-17: qty 100

## 2016-06-17 MED ORDER — POTASSIUM CHLORIDE 20 MEQ/15ML (10%) PO SOLN
40.0000 meq | Freq: Once | ORAL | Status: AC
  Administered 2016-06-17: 40 meq via ORAL
  Filled 2016-06-17: qty 30

## 2016-06-17 MED ORDER — POTASSIUM CHLORIDE 10 MEQ/50ML IV SOLN
10.0000 meq | INTRAVENOUS | Status: AC
  Administered 2016-06-17 (×6): 10 meq via INTRAVENOUS
  Filled 2016-06-17 (×6): qty 50

## 2016-06-17 NOTE — Progress Notes (Signed)
S: Abd pain better O:BP (!) 144/60   Pulse 75   Temp 97.9 F (36.6 C)   Resp 14   Ht 5' (1.524 m)   Wt 76.6 kg (168 lb 14 oz)   SpO2 100%   BMI 32.98 kg/m   Intake/Output Summary (Last 24 hours) at 06/17/16 [id number] Last data filed at 06/17/16 0800  Gross per 24 hour  Intake          2120.36 ml  Output             4290 ml  Net         -2169.64 ml   Weight change:  Gen: Awake and alert CVS: RRR Resp: Basilar crackles Abd: + BS Soft, mild upper quadrant tenderness Ext:  No edema legs  + edema hands NEURO: Moves all extremities, Ox3   . arformoterol  15 mcg Nebulization BID  . budesonide (PULMICORT) nebulizer solution  0.5 mg Nebulization BID  . famotidine (PEPCID) IV  20 mg Intravenous Q24H  . furosemide  80 mg Intravenous Q12H  . insulin aspart  0-9 Units Subcutaneous Q4H  . levothyroxine  37 mcg Intravenous Daily  . mouth rinse  15 mL Mouth Rinse BID  . metoprolol  5 mg Intravenous Q6H  . potassium chloride  10 mEq Intravenous Q1 Hr x 6   Dg Chest Port 1 View  Result Date: 06/16/2016 CLINICAL DATA:  Acute respiratory failure EXAM: PORTABLE CHEST 1 VIEW COMPARISON:  June 15, 2016 FINDINGS: The heart size and mediastinal contours are stable. Patchy consolidation throughout bilateral lungs are identified unchanged compared prior exam. There is probably small left pleural effusion unchanged. Right central venous line is identified distal tip in the superior vena cava unchanged. The visualized skeletal structures are stable. IMPRESSION: Persistent patchy consolidation throughout bilateral lungs unchanged can be due to either edema or pneumonia. Persistent small left pleural effusion. Electronically Signed   By: Abelardo Diesel M.D.   On: 06/16/2016 07:35   Dg Swallowing Func-speech Pathology  Result Date: 06/16/2016 Objective Swallowing Evaluation: Type of Study: MBS-Modified Barium Swallow Study Patient Details Name: ALAYASIA FRANTA MRN: [id number] Date of Birth: 1946-02-13 Today's  Date: 06/16/2016 Time: SLP Start Time (ACUTE ONLY): 1330-SLP Stop Time (ACUTE ONLY): 1355 SLP Time Calculation (min) (ACUTE ONLY): 25 min Past Medical History: Past Medical History: Diagnosis Date . Anemia, iron deficiency  . Antiphospholipid syndrome (HCC)   with hypercoagulable state . Asthma   extrinsic; moderate, persistant, nml spirometry 2010, nml CXR 1/08 . Bladder troubles   REPORTS INFECTIONS ON OCCASION DUE TO URETHRA MEATUS STRICTURE AT BIRTH  . Blood dyscrasia   antiphospholipid disorder . CAD (coronary artery disease)   50% mid LAD, 80% ostial D1 and moderate 80% mid circ by vath 2003 . Cancer ([place])   basal cell removed fr. L arm  . Complication of anesthesia   cardiac arrest- in OR, at age 9 (82)y.o. during Scalenotomy in her early 20's . Coronary heart disease  . Drug allergy   heparin/lovenox . DVT (deep venous thrombosis) ([place])  . Erosive gastritis 1994 . Family history of adverse reaction to anesthesia   some family members have had trouble waking up . GERD (gastroesophageal reflux disease)  . H/O hiatal hernia  . HLD (hyperlipidemia)  . HTN (hypertension)   [name] at [facility], manages pt.  LOV 03/2014 . Hypothyroidism  . Liver spot   cyst- - no biopsy, but told that its benign  . PAF (paroxysmal atrial fibrillation) ([place])  not on coumadin therapy . Pulmonary embolism ([place])   related to back surgery with prior coumadin use, now off . Spondylosis, lumbosacral   ARTHRITIS- OA  Past Surgical History: Past Surgical History: Procedure Laterality Date . ABDOMINAL HYSTERECTOMY    partial abdominal -1998 . BACK SURGERY    x13 cervical and lumbar thoracic spine surgery . BREAST ENHANCEMENT SURGERY   . BREAST SURGERY    all benign cysts x3  . CARDIAC CATHETERIZATION    2005 . cleft lip and palate repair    70 yo  . hysterectomy   . IR GENERIC HISTORICAL  06/04/2016  IR PERC CHOLECYSTOSTOMY 06/04/2016 Greggory Keen, MD [facility]-INTER[facility] RAD . JOINT REPLACEMENT   . POSTERIOR CERVICAL FUSION/FORAMINOTOMY Right  08/31/2014  Procedure: Right cervical six-seven foraminotomy, Cervical five-Thoracic one fusion, Cervical six-seven lateral mass screws;  Surgeon: Erline Levine, MD;  Location: [facility];  Service: Neurosurgery;  Laterality: Right; . spina bifida repair    69 yo  . TONSILLECTOMY   . TOTAL KNEE ARTHROPLASTY    left HPI: 70 y/o woman with respiratory failure, severe sepsis of presumed biliary origin and AKI. She required intubation 8/14-8/18, and again 8/18-8/23. PMH: PE, PAF, HTN, HLD, HH, GERD, CAD, asthma, spondylosis requiring cervical fusion C6-C7, and cleft lip/partial palate s/p surgical intervention as a child Subjective: Alert, cooperative Assessment / Plan / Recommendation CHL IP CLINICAL IMPRESSIONS 06/16/2016 Therapy Diagnosis Mild oral phase dysphagia;Moderate oral phase dysphagia;Moderate pharyngeal phase dysphagia Clinical Impression Patient presents with a mild-mod oral and moderate pharyngeal dysphagia which appears to be largely motor-based, but with some impact of sensory impairment at pharyngeal phase. Patient's oral dysphagia is characterized by decreased ability to masticate and orally manipulate and move solid texture bolus anterior to posterior swallow initiation delay to vallecular sinus, and patient unable to transit pill anterior to posterior and had to spit it out. Patient's pharyngeal phase dysphagia is characterized by swallow initiation delays to vallecular sinus with solid and liquid boluses, moderate vallecular residuals from regular and puree solids, which slowly cleared with subsequent swallows and sips of liquids. Despite SLP instructing patient to take small sips, she took large volume sips of liquids, which led to flash penetration of nectar thick liquids and consistent penetration without full clearance with thin liquids. She did still exhibit flash penetration with thin liquids when SLP gave her a controlled, small sip. Nectar thick liquids resulted in increased residuals, and  did not significantly reduce risk of penetration, and so thin liquids are recommended, with small sips. Impact on safety and function Moderate aspiration risk   CHL IP TREATMENT RECOMMENDATION 06/16/2016 Treatment Recommendations Therapy as outlined in treatment plan below   Prognosis 06/16/2016 Prognosis for Safe Diet Advancement Good Barriers to Reach Goals -- Barriers/Prognosis Comment -- CHL IP DIET RECOMMENDATION 06/16/2016 SLP Diet Recommendations Dysphagia 2 (Fine chop) solids;Thin liquid Liquid Administration via Cup;No straw Medication Administration Crushed with puree Compensations Slow rate;Small sips/bites;Follow solids with liquid;Clear throat intermittently Postural Changes Seated upright at 90 degrees   CHL IP OTHER RECOMMENDATIONS 06/16/2016 Recommended Consults -- Oral Care Recommendations Oral care BID Other Recommendations --   CHL IP FOLLOW UP RECOMMENDATIONS 06/16/2016 Follow up Recommendations Skilled Nursing facility   [facility] IP FREQUENCY AND DURATION 06/16/2016 Speech Therapy Frequency (ACUTE ONLY) min 2x/week Treatment Duration 2 weeks      CHL IP ORAL PHASE 06/16/2016 Oral Phase Impaired Oral - Pudding Teaspoon -- Oral - Pudding Cup -- Oral - Honey Teaspoon -- Oral - Honey Cup -- Oral -  Nectar Teaspoon -- Oral - Nectar Cup -- Oral - Nectar Straw -- Oral - Thin Teaspoon -- Oral - Thin Cup -- Oral - Thin Straw -- Oral - Puree Weak lingual manipulation;Delayed oral transit Oral - Mech Soft -- Oral - Regular Impaired mastication;Delayed oral transit Oral - Multi-Consistency -- Oral - Pill Other (Comment);Reduced posterior propulsion Oral Phase - Comment --  CHL IP PHARYNGEAL PHASE 06/16/2016 Pharyngeal Phase Impaired Pharyngeal- Pudding Teaspoon -- Pharyngeal -- Pharyngeal- Pudding Cup -- Pharyngeal -- Pharyngeal- Honey Teaspoon -- Pharyngeal -- Pharyngeal- Honey Cup -- Pharyngeal -- Pharyngeal- Nectar Teaspoon -- Pharyngeal -- Pharyngeal- Nectar Cup Pharyngeal residue - valleculae;Pharyngeal residue -  pyriform;Pharyngeal residue - posterior pharnyx;Penetration/Aspiration during swallow Pharyngeal Material enters airway, remains ABOVE vocal cords then ejected out Pharyngeal- Nectar Straw -- Pharyngeal -- Pharyngeal- Thin Teaspoon -- Pharyngeal -- Pharyngeal- Thin Cup Delayed swallow initiation-vallecula;Reduced airway/laryngeal closure;Penetration/Aspiration during swallow;Pharyngeal residue - valleculae;Pharyngeal residue - pyriform Pharyngeal Material enters airway, remains ABOVE vocal cords and not ejected out Pharyngeal- Thin Straw Delayed swallow initiation-vallecula;Delayed swallow initiation-pyriform sinuses;Reduced airway/laryngeal closure;Penetration/Aspiration during swallow;Pharyngeal residue - posterior pharnyx Pharyngeal Material enters airway, remains ABOVE vocal cords and not ejected out Pharyngeal- Puree Delayed swallow initiation-vallecula;Pharyngeal residue - valleculae Pharyngeal -- Pharyngeal- Mechanical Soft -- Pharyngeal -- Pharyngeal- Regular Delayed swallow initiation-vallecula;Pharyngeal residue - valleculae Pharyngeal -- Pharyngeal- Multi-consistency -- Pharyngeal -- Pharyngeal- Pill -- Pharyngeal -- Pharyngeal Comment --  CHL IP CERVICAL ESOPHAGEAL PHASE 06/16/2016 Cervical Esophageal Phase WFL Pudding Teaspoon -- Pudding Cup -- Honey Teaspoon -- Honey Cup -- Nectar Teaspoon -- Nectar Cup -- Nectar Straw -- Thin Teaspoon -- Thin Cup -- Thin Straw -- Puree -- Mechanical Soft -- Regular -- Multi-consistency -- Pill -- Cervical Esophageal Comment -- CHL IP GO 03/26/2014 Functional Assessment Tool Used Clinical judgement Functional Limitations Swallowing Swallow Current Status [id number]) CI Swallow Goal Status [id number]) CI Swallow Discharge Status [id number]) CI Motor Speech Current Status [id number]) (None) Motor Speech Goal Status UK:[id number]) (None) Motor Speech Goal Status [id number]) (None) Spoken Language Comprehension Current Status [id number]) (None) Spoken Language Comprehension Goal Status [id number]) (None)  Spoken Language Comprehension Discharge Status [id number]) (None) Spoken Language Expression Current Status [id number]) (None) Spoken Language Expression Goal Status [id number]) (None) Spoken Language Expression Discharge Status [phone]) (None) Attention Current Status [id number]) (None) Attention Goal Status [id number]:[id number]) (None) Attention Discharge Status [id number]) (None) Memory Current Status [id number]) (None) Memory Goal Status [id number]) (None) Memory Discharge Status QC:[id number]) (None) Voice Current Status [id number]) (None) Voice Goal Status [id number]) (None) Voice Discharge Status [id number]) (None) Other Speech-Language Pathology Functional Limitation [phone]) (None) Other Speech-Language Pathology Functional Limitation Goal Status [id number]) (None) Other Speech-Language Pathology Functional Limitation Discharge Status [phone]) (None)      Sonia Baller, MA, CCC-SLP 06/16/16 5:04 PM         BMET    Component Value Date/Time   NA 143 06/17/2016 0500   K 2.5 (LL) 06/17/2016 0500   CL 96 (L) 06/17/2016 0500   CO2 37 (H) 06/17/2016 0500   GLUCOSE 122 (H) 06/17/2016 0500   BUN 76 (H) 06/17/2016 0500   CREATININE 2.81 (H) 06/17/2016 0500   CREATININE 0.50 02/12/2013 1831   CALCIUM 8.6 (L) 06/17/2016 0500   GFRNONAA 16 (L) 06/17/2016 0500   GFRAA 19 (L) 06/17/2016 0500   CBC    Component Value Date/Time   WBC 12.9 (H) 06/16/2016 0445   RBC 3.19 (L) 06/16/2016 0445   HGB 8.7 (L) 06/16/2016 0445   HCT 27.6 (L) 06/16/2016 0445   PLT 190 06/16/2016 0445  MCV 86.5 06/16/2016 0445   MCV 85.6 12/30/2014 1756   MCH 27.3 06/16/2016 0445   MCHC 31.5 06/16/2016 0445   RDW 15.3 06/16/2016 0445   LYMPHSABS 1.8 07/22/2014 1624   MONOABS 0.4 07/22/2014 1624   EOSABS 0.1 07/22/2014 1624   BASOSABS 0.0 07/22/2014 1624     Assessment:  1.  ARF secondary to Gm neg sepsis, hypotension and IV contrast, non oliguric.  Scr sl better 2. Enterococcal/E coli sepsis probably secondary to cholecystitis 3. Hypokalemia 4. Hypernatremia on D5W and  lasix 5. A fib, paroxysmal.  Now NSR  Plan: 1.  Renal fx cont to improve.  I will sign off.  Call if further renal issues 2. Replace K as you are doing 3. Check Mag level  Nalany Steedley T

## 2016-06-17 NOTE — Progress Notes (Signed)
CRITICAL VALUE ALERT  Critical value received:  K 2.5  Date of notification:  06/17/2016  Time of notification:  06:10  Critical value read back:Yes.    Nurse who received alert:  BShepherd, RN  MD notified (1st page):  Eulas Post, [place].  Time of first page:  06:19  MD notified (2nd page):  Time of second page:  Responding MD:  Eulas Post, Delane Ginger.  Time MD responded:  06:20

## 2016-06-17 NOTE — Progress Notes (Signed)
PROGRESS NOTE    Latoya [name]  [id number] DOB: 08/15/1946 DOA: 06/04/2016 PCP: Thressa Sheller, MD   Brief Narrative:  70 y/o WF PMHx Antiphospholipid Syndrome on AC (hx of one provoked DVT/PE), as well as CAD native artery, HTN,  PAF, HLD,, Erosive gastritis , Asthma,Spondylosis, lumbosacral.  Who presented to the ED with sudden onset of chills and abdominal pain. In the ED her clinical status declined, becoming less responsive, more tachycardic and more tachypneic. Imaging studies suggested an enlarged gallbladder.   Subjective: 8/27 last 24 hours afebrile   Assessment & Plan:   Principal Problem:   Ascending cholangitis Active Problems:   PAROXYSMAL ATRIAL FIBRILLATION   Sepsis ([place])   Hemangioma of liver   Severe sepsis (HCC)   Septic shock (HCC)   Acute respiratory failure with hypoxia (HCC)   Enterococcal bacteremia   Bacteremia due to Klebsiella pneumoniae   Bacteremia due to Escherichia coli   Acute respiratory failure (HCC)   Cholangitis   Gastroparesis  Severe sepsis, probable biliary origin -Continue current antibiotics   Moderate persistent asthma  Chronic systolic and diastolic CHF -Lasix 80 mg BID -Hydralazine PRN -Metoprolol 5 mg IV QID -Strict in and out -Daily weight  CAD native artery   HTN -Controlled  Paroxysmal Atrial Fibrillation -Currently in normal sinus rhythm -Continue Angiomax per pharmacy -Continue amiodarone drip: Consider transitioning to PO when patient's nausea control  Acute renal failure -Baseline Cr unk Lab Results  Component Value Date   CREATININE 2.81 (H) 06/17/2016   CREATININE 3.24 (H) 06/16/2016   CREATININE 3.63 (H) 06/15/2016   CREATININE 3.69 (H) 06/15/2016    Antiphospholipid Ab Syndrome -Angiomax, Xarelto held for procedure and Hx of allergy to Heparin products   Hypothyroidism -Synthroid 37 g daily  Hypokalemia  -Potassium goal> 4 -Potassium PO 40 mEq -Repeat K/Mg  lab  Hypomagnesemia  -Magnesium goal> 2 -Magnesium IV 1 g      DVT prophylaxis: Angiomax Code Status: DO NOT RESUSCITATE Family Communication: None Disposition Plan: SNF?   Consultants:  PC CM Dr. Edrick Oh, nephrology Dr. Erroll Luna CCS    Procedures/Significant Events:  8/14 ultrasound abdomen RUQ:-. 7.9 x 7.8 x 8.1 cm complex hepatic mass, 8/14 CT angiogram abdomen and pelvis:-2. Diffuse gallbladder distention, and dilatation of the common bile duct to 1.3 cm in diameter,concerning for distal obstruction. - Intrahepatic biliary ductal dilatation- 7.1 cm right hepatic mass, with prominent peripheral vasculature, likely reflects a large benign hemangioma. This has increased from 4.3 cm in 2007. - Chronic rupture of the patient's right-sided breast implant, stable from 2007, with leakage into the right lateral breast, 8/14 [place] Percutaneous transhepatic cholecystostomy. 8/20 echocardiogram:- LVEF =  50%. -(grade 2 diastolic dysfunction). - Mitral valve: moderate regurgitation -- Left atrium: moderately dilated.     Cultures Blood 8/14 >> positive E. Coli and enterococcous and klebsiella. Urine 8/14 >>NTD. Abscess 8/14>>> positive E.Coli and enterococcus (both pan sens)      Antimicrobials: Unasyn 8/17>> 8/21 Vanc 8/14 >> 8/16 Zosyn 8/14 >>8/17     Devices    LINES / TUBES:  ETT 7.5 mm 8/14>>>8/18, 8/18>>> 8/24 Foley 8/14>>> R IJ TLC 8/14>>> Gall bladder drain (IR) 8/14>>>    Continuous Infusions: . amiodarone 30 mg/hr (06/17/16 1200)  . bivalirudin (ANGIOMAX) infusion 0.5 mg/mL (Non-ACS indications) 0.05 mg/kg/hr (06/17/16 1200)  . dextrose 75 mL/hr at 06/17/16 0900     Objective: Vitals:   06/17/16 1400 06/17/16 1500 06/17/16 1600 06/17/16 1657  BP: (!) 116/53 (!) 115/56 Marland Kitchen)  124/52 (!) 129/45  Pulse: 67 63 69 76  Resp: 17 18 (!) 24 20  Temp:    98 F (36.7 C)  TempSrc:    Oral  SpO2: 99% 100% 97% 95%  Weight:      Height:         Intake/Output Summary (Last 24 hours) at 06/17/16 1926 Last data filed at 06/17/16 1600  Gross per 24 hour  Intake           2782.1 ml  Output             2440 ml  Net            342.1 ml   Filed Weights   06/14/16 0249 06/15/16 0430 06/16/16 0500  Weight: 82.6 kg (182 lb 1.6 oz) 77.4 kg (170 lb 10.2 oz) 76.6 kg (168 lb 14 oz)    Examination:  General: Patient sleepy but arousable, oriented 4, No acute respiratory distress Eyes: negative scleral hemorrhage, negative anisocoria, negative icterus ENT: Negative Runny nose, negative gingival bleeding, Neck:  Negative scars, masses, torticollis, lymphadenopathy, JVD right IJ covered and clean negative sign of infection Lungs: Clear to auscultation bilaterally without wheezes or crackles Cardiovascular: Regular rate and rhythm without murmur gallop or rub normal S1 and S2 Abdomen: negative abdominal pain, nondistended, positive soft, bowel sounds, no rebound, no ascites, no appreciable mass, right gallbladder drain in place draining watery brown fluid, negative sign of blood/pus Extremities: No significant cyanosis, clubbing, or edema bilateral lower extremities Skin: Negative rashes, lesions, ulcers Psychiatric:  Negative depression, negative anxiety, negative fatigue, negative mania Central nervous system:  Cranial nerves II through XII intact, tongue/uvula midline, patient with limited cervical range of motion secondary to multiple C-spine surgeries, in all extremities muscle strength 5/5, sensation intact throughout, negative dysarthria, negative expressive aphasia, negative receptive aphasia.  .     Data Reviewed: Care during the described time interval was provided by me .  I have reviewed this patient's available data, including medical history, events of note, physical examination, and all test results as part of my evaluation. I have personally reviewed and interpreted all radiology studies.  CBC:  Recent Labs Lab  06/14/16 0515 06/12/16 0345 06/13/16 0440 06/15/16 0340 06/16/16 0445  WBC 18.0* 13.7* 10.1 15.7* 12.9*  HGB 8.9* 9.3* 8.3* 9.4* 8.7*  HCT 27.1* 28.0* 25.3* 29.5* 27.6*  MCV 82.6 83.3 84.1 86.8 86.5  PLT 113* 145* 175 201 [id number]   Basic Metabolic Panel:  Recent Labs Lab 06/13/16 0440 06/14/16 0500 06/15/16 0340 06/16/16 0444 06/17/16 0500 06/17/16 0834 06/17/16 1535  NA 150* 149* 152*  151* 149* 143  --   --   K 3.5 3.2* 3.0*  3.0* 2.4* 2.5*  --  3.9  CL 106 104 104  105 101 96*  --   --   CO2 31 31 29  29  35* 37*  --   --   GLUCOSE 126* 128* 114*  116* 147* 122*  --   --   BUN 121* 121* 111*  111* 93* 76*  --   --   CREATININE 4.09* 3.91* 3.63*  3.69* 3.24* 2.81*  --   --   CALCIUM 8.0* 8.2* 8.7*  8.7* 9.1 8.6*  --   --   MG  --   --  2.2  --   --  1.6* 1.8  PHOS 6.7* 8.4* 7.1*  7.1* 5.3* 5.3*  --   --    GFR: Estimated Creatinine Clearance: 17.3  mL/min (by C-G formula based on SCr of 2.81 mg/dL). Liver Function Tests:  Recent Labs Lab 06/11/16 0515  06/13/16 0440 06/14/16 0500 06/15/16 0340 06/16/16 0444 06/17/16 0500  AST 18  --   --   --   --   --   --   ALT 34  --   --   --   --   --   --   ALKPHOS 105  --   --   --   --   --   --   BILITOT 0.7  --   --   --   --   --   --   PROT 5.2*  --   --   --   --   --   --   ALBUMIN 2.0*  2.0*  < > 2.0* 2.3* 2.3* 2.4* 2.3*  < > = values in this interval not displayed. No results for input(s): LIPASE, AMYLASE in the last 168 hours. No results for input(s): AMMONIA in the last 168 hours. Coagulation Profile: No results for input(s): INR, PROTIME in the last 168 hours. Cardiac Enzymes: No results for input(s): CKTOTAL, CKMB, CKMBINDEX, TROPONINI in the last 168 hours. BNP (last 3 results) No results for input(s): PROBNP in the last 8760 hours. HbA1C: No results for input(s): HGBA1C in the last 72 hours. CBG:  Recent Labs Lab 06/16/16 2353 06/17/16 0400 06/17/16 0754 06/17/16 1248 06/17/16 1626   GLUCAP 118* 117* 132* 130* 122*   Lipid Profile: No results for input(s): CHOL, HDL, LDLCALC, TRIG, CHOLHDL, LDLDIRECT in the last 72 hours. Thyroid Function Tests: No results for input(s): TSH, T4TOTAL, FREET4, T3FREE, THYROIDAB in the last 72 hours. Anemia Panel: No results for input(s): VITAMINB12, FOLATE, FERRITIN, TIBC, IRON, RETICCTPCT in the last 72 hours. Urine analysis:    Component Value Date/Time   COLORURINE YELLOW 06/07/2016 1223   APPEARANCEUR CLOUDY (A) 06/07/2016 1223   LABSPEC >1.030 (H) 06/07/2016 1223   PHURINE 5.5 06/07/2016 1223   GLUCOSEU NEGATIVE 06/07/2016 1223   HGBUR LARGE (A) 06/07/2016 1223   BILIRUBINUR NEGATIVE 06/07/2016 1223   BILIRUBINUR neg 02/08/2014 1955   KETONESUR NEGATIVE 06/07/2016 1223   PROTEINUR 30 (A) 06/07/2016 1223   UROBILINOGEN 0.2 02/08/2014 1955   UROBILINOGEN 2.0 (H) 02/06/2009 1620   NITRITE NEGATIVE 06/07/2016 1223   LEUKOCYTESUR TRACE (A) 06/07/2016 1223   Sepsis Labs: @LABRCNTIP (procalcitonin:4,lacticidven:4)  ) Recent Results (from the past 240 hour(s))  Culture, respiratory (NON-Expectorated)     Status: None   Collection Time: 06/09/16  3:20 AM  Result Value Ref Range Status   Specimen Description TRACHEAL ASPIRATE  Final   Special Requests NONE  Final   Gram Stain   Final    ABUNDANT WBC PRESENT, PREDOMINANTLY PMN RARE YEAST    Culture FEW CANDIDA ALBICANS RARE CANDIDA GLABRATA   Final   Report Status 06/11/2016 FINAL  Final  C difficile quick scan w PCR reflex     Status: None   Collection Time: 06/11/16 10:52 AM  Result Value Ref Range Status   C Diff antigen NEGATIVE NEGATIVE Final   C Diff toxin NEGATIVE NEGATIVE Final   C Diff interpretation No C. difficile detected.  Final         Radiology Studies: Dg Chest Port 1 View  Result Date: 06/16/2016 CLINICAL DATA:  Acute respiratory failure EXAM: PORTABLE CHEST 1 VIEW COMPARISON:  June 15, 2016 FINDINGS: The heart size and mediastinal contours  are stable. Patchy consolidation throughout bilateral  lungs are identified unchanged compared prior exam. There is probably small left pleural effusion unchanged. Right central venous line is identified distal tip in the superior vena cava unchanged. The visualized skeletal structures are stable. IMPRESSION: Persistent patchy consolidation throughout bilateral lungs unchanged can be due to either edema or pneumonia. Persistent small left pleural effusion. Electronically Signed   By: Abelardo Diesel M.D.   On: 06/16/2016 07:35   Dg Swallowing Func-speech Pathology  Result Date: 06/16/2016 Objective Swallowing Evaluation: Type of Study: MBS-Modified Barium Swallow Study Patient Details Name: Latoya [name] MRN: [id number] Date of Birth: 01-Sep-1946 Today's Date: 06/16/2016 Time: SLP Start Time (ACUTE ONLY): 1330-SLP Stop Time (ACUTE ONLY): 1355 SLP Time Calculation (min) (ACUTE ONLY): 25 min Past Medical History: Past Medical History: Diagnosis Date . Anemia, iron deficiency  . Antiphospholipid syndrome (HCC)   with hypercoagulable state . Asthma   extrinsic; moderate, persistant, nml spirometry 2010, nml CXR 1/08 . Bladder troubles   REPORTS INFECTIONS ON OCCASION DUE TO URETHRA MEATUS STRICTURE AT BIRTH  . Blood dyscrasia   antiphospholipid disorder . CAD (coronary artery disease)   50% mid LAD, 80% ostial D1 and moderate 80% mid circ by vath 2003 . Cancer ([place])   basal cell removed fr. L arm  . Complication of anesthesia   cardiac arrest- in OR, at age 45 (14)y.o. during Scalenotomy in her early 20's . Coronary heart disease  . Drug allergy   heparin/lovenox . DVT (deep venous thrombosis) ([place])  . Erosive gastritis 1994 . Family history of adverse reaction to anesthesia   some family members have had trouble waking up . GERD (gastroesophageal reflux disease)  . H/O hiatal hernia  . HLD (hyperlipidemia)  . HTN (hypertension)   [name] at [facility], manages pt.  LOV 03/2014 . Hypothyroidism  . Liver spot   cyst- - no  biopsy, but told that its benign  . PAF (paroxysmal atrial fibrillation) (HCC)   not on coumadin therapy . Pulmonary embolism ([place])   related to back surgery with prior coumadin use, now off . Spondylosis, lumbosacral   ARTHRITIS- OA  Past Surgical History: Past Surgical History: Procedure Laterality Date . ABDOMINAL HYSTERECTOMY    partial abdominal -1998 . BACK SURGERY    x13 cervical and lumbar thoracic spine surgery . BREAST ENHANCEMENT SURGERY   . BREAST SURGERY    all benign cysts x3  . CARDIAC CATHETERIZATION    2005 . cleft lip and palate repair    70 yo  . hysterectomy   . IR GENERIC HISTORICAL  06/04/2016  IR PERC CHOLECYSTOSTOMY 06/04/2016 Greggory Keen, MD [facility] . JOINT REPLACEMENT   . POSTERIOR CERVICAL FUSION/FORAMINOTOMY Right 08/31/2014  Procedure: Right cervical six-seven foraminotomy, Cervical five-Thoracic one fusion, Cervical six-seven lateral mass screws;  Surgeon: Erline Levine, MD;  Location: [facility];  Service: Neurosurgery;  Laterality: Right; . spina bifida repair    70 yo  . TONSILLECTOMY   . TOTAL KNEE ARTHROPLASTY    left HPI: 70 y/o woman with respiratory failure, severe sepsis of presumed biliary origin and AKI. She required intubation 8/14-8/18, and again 8/18-8/23. PMH: PE, PAF, HTN, HLD, HH, GERD, CAD, asthma, spondylosis requiring cervical fusion C6-C7, and cleft lip/partial palate s/p surgical intervention as a child Subjective: Alert, cooperative Assessment / Plan / Recommendation CHL IP CLINICAL IMPRESSIONS 06/16/2016 Therapy Diagnosis Mild oral phase dysphagia;Moderate oral phase dysphagia;Moderate pharyngeal phase dysphagia Clinical Impression Patient presents with a mild-mod oral and moderate pharyngeal dysphagia which appears to be largely motor-based,  but with some impact of sensory impairment at pharyngeal phase. Patient's oral dysphagia is characterized by decreased ability to masticate and orally manipulate and move solid texture bolus anterior to posterior swallow  initiation delay to vallecular sinus, and patient unable to transit pill anterior to posterior and had to spit it out. Patient's pharyngeal phase dysphagia is characterized by swallow initiation delays to vallecular sinus with solid and liquid boluses, moderate vallecular residuals from regular and puree solids, which slowly cleared with subsequent swallows and sips of liquids. Despite SLP instructing patient to take small sips, she took large volume sips of liquids, which led to flash penetration of nectar thick liquids and consistent penetration without full clearance with thin liquids. She did still exhibit flash penetration with thin liquids when SLP gave her a controlled, small sip. Nectar thick liquids resulted in increased residuals, and did not significantly reduce risk of penetration, and so thin liquids are recommended, with small sips. Impact on safety and function Moderate aspiration risk   CHL IP TREATMENT RECOMMENDATION 06/16/2016 Treatment Recommendations Therapy as outlined in treatment plan below   Prognosis 06/16/2016 Prognosis for Safe Diet Advancement Good Barriers to Reach Goals -- Barriers/Prognosis Comment -- CHL IP DIET RECOMMENDATION 06/16/2016 SLP Diet Recommendations Dysphagia 2 (Fine chop) solids;Thin liquid Liquid Administration via Cup;No straw Medication Administration Crushed with puree Compensations Slow rate;Small sips/bites;Follow solids with liquid;Clear throat intermittently Postural Changes Seated upright at 90 degrees   CHL IP OTHER RECOMMENDATIONS 06/16/2016 Recommended Consults -- Oral Care Recommendations Oral care BID Other Recommendations --   CHL IP FOLLOW UP RECOMMENDATIONS 06/16/2016 Follow up Recommendations Skilled Nursing facility   [facility] IP FREQUENCY AND DURATION 06/16/2016 Speech Therapy Frequency (ACUTE ONLY) min 2x/week Treatment Duration 2 weeks      CHL IP ORAL PHASE 06/16/2016 Oral Phase Impaired Oral - Pudding Teaspoon -- Oral - Pudding Cup -- Oral - Honey Teaspoon --  Oral - Honey Cup -- Oral - Nectar Teaspoon -- Oral - Nectar Cup -- Oral - Nectar Straw -- Oral - Thin Teaspoon -- Oral - Thin Cup -- Oral - Thin Straw -- Oral - Puree Weak lingual manipulation;Delayed oral transit Oral - Mech Soft -- Oral - Regular Impaired mastication;Delayed oral transit Oral - Multi-Consistency -- Oral - Pill Other (Comment);Reduced posterior propulsion Oral Phase - Comment --  CHL IP PHARYNGEAL PHASE 06/16/2016 Pharyngeal Phase Impaired Pharyngeal- Pudding Teaspoon -- Pharyngeal -- Pharyngeal- Pudding Cup -- Pharyngeal -- Pharyngeal- Honey Teaspoon -- Pharyngeal -- Pharyngeal- Honey Cup -- Pharyngeal -- Pharyngeal- Nectar Teaspoon -- Pharyngeal -- Pharyngeal- Nectar Cup Pharyngeal residue - valleculae;Pharyngeal residue - pyriform;Pharyngeal residue - posterior pharnyx;Penetration/Aspiration during swallow Pharyngeal Material enters airway, remains ABOVE vocal cords then ejected out Pharyngeal- Nectar Straw -- Pharyngeal -- Pharyngeal- Thin Teaspoon -- Pharyngeal -- Pharyngeal- Thin Cup Delayed swallow initiation-vallecula;Reduced airway/laryngeal closure;Penetration/Aspiration during swallow;Pharyngeal residue - valleculae;Pharyngeal residue - pyriform Pharyngeal Material enters airway, remains ABOVE vocal cords and not ejected out Pharyngeal- Thin Straw Delayed swallow initiation-vallecula;Delayed swallow initiation-pyriform sinuses;Reduced airway/laryngeal closure;Penetration/Aspiration during swallow;Pharyngeal residue - posterior pharnyx Pharyngeal Material enters airway, remains ABOVE vocal cords and not ejected out Pharyngeal- Puree Delayed swallow initiation-vallecula;Pharyngeal residue - valleculae Pharyngeal -- Pharyngeal- Mechanical Soft -- Pharyngeal -- Pharyngeal- Regular Delayed swallow initiation-vallecula;Pharyngeal residue - valleculae Pharyngeal -- Pharyngeal- Multi-consistency -- Pharyngeal -- Pharyngeal- Pill -- Pharyngeal -- Pharyngeal Comment --  CHL IP CERVICAL ESOPHAGEAL  PHASE 06/16/2016 Cervical Esophageal Phase WFL Pudding Teaspoon -- Pudding Cup -- Honey Teaspoon -- Honey Cup -- Nectar Teaspoon -- Nectar Cup --  Nectar Straw -- Thin Teaspoon -- Thin Cup -- Thin Straw -- Puree -- Mechanical Soft -- Regular -- Multi-consistency -- Pill -- Cervical Esophageal Comment -- CHL IP GO 03/26/2014 Functional Assessment Tool Used Clinical judgement Functional Limitations Swallowing Swallow Current Status [id number]) CI Swallow Goal Status [id number]) CI Swallow Discharge Status [id number]) CI Motor Speech Current Status [id number]) (None) Motor Speech Goal Status UK:060616) (None) Motor Speech Goal Status [id number]) (None) Spoken Language Comprehension Current Status [id number]) (None) Spoken Language Comprehension Goal Status [id number]) (None) Spoken Language Comprehension Discharge Status [phone]) (None) Spoken Language Expression Current Status [id number]) (None) Spoken Language Expression Goal Status [id number]) (None) Spoken Language Expression Discharge Status [phone]) (None) Attention Current Status [id number]) (None) Attention Goal Status [id number]:7266000) (None) Attention Discharge Status [id number]) (None) Memory Current Status [id number]) (None) Memory Goal Status [id number]) (None) Memory Discharge Status QC:115444) (None) Voice Current Status [id number]) (None) Voice Goal Status [id number]) (None) Voice Discharge Status [id number]) (None) Other Speech-Language Pathology Functional Limitation [phone]) (None) Other Speech-Language Pathology Functional Limitation Goal Status [id number]) (None) Other Speech-Language Pathology Functional Limitation Discharge Status [phone]) (None)      Sonia Baller, MA, CCC-SLP 06/16/16 5:04 PM              Scheduled Meds: . arformoterol  15 mcg Nebulization BID  . budesonide (PULMICORT) nebulizer solution  0.5 mg Nebulization BID  . famotidine (PEPCID) IV  20 mg Intravenous Q24H  . furosemide  80 mg Intravenous Q12H  . insulin aspart  0-9 Units Subcutaneous Q4H  . levothyroxine  37 mcg Intravenous Daily  . mouth rinse   15 mL Mouth Rinse BID  . metoprolol  5 mg Intravenous Q6H   Continuous Infusions: . amiodarone 30 mg/hr (06/17/16 1200)  . bivalirudin (ANGIOMAX) infusion 0.5 mg/mL (Non-ACS indications) 0.05 mg/kg/hr (06/17/16 1200)  . dextrose 75 mL/hr at 06/17/16 0900     LOS: 13 days    Time spent: 40 minutes    Yasha Tibbett, Geraldo Docker, MD [facility] Pager [phone]   If 7PM-7AM, please contact night-coverage [email or web address] Password [facility] 06/17/2016, 7:26 PM     `

## 2016-06-17 NOTE — Progress Notes (Signed)
PT Cancellation Note  Patient Details Name: Latoya [name] MRN: [id number] DOB: December 03, 1945   Cancelled Treatment:    Reason Eval/Treat Not Completed: Fatigue/lethargy limiting ability to participate;Patient declined, no reason specified.  Husband declined PT this pm.  States patient is having a bad day and is trying to get some sleep.  Will return tomorrow for PT evaluation.   Despina Pole 06/17/2016, 6:43 PM Carita Pian. Sanjuana Kava, [place] Pager [phone]

## 2016-06-17 NOTE — Progress Notes (Signed)
PT Cancellation Note  Patient Details Name: Latoya [name] MRN: [id number] DOB: 04/05/1946   Cancelled Treatment:    Reason Eval/Treat Not Completed: Medical issues which prohibited therapy. Patient reports feeling poorly.  Complains of nausea and feels unable to participate with PT at this time.  Will return at later time to attempt PT evaluation.   Despina Pole 06/17/2016, 11:31 AM Carita Pian. Sanjuana Kava, [place] Pager [phone]

## 2016-06-17 NOTE — Progress Notes (Addendum)
[place] for Bivalirudin  Indication: atrial fibrillation   Allergies  Allergen Reactions  . Hornet Venom Anaphylaxis  . Pork-Derived Products Anaphylaxis  . Chlorhexidine Itching    Reports that it is the dye in it  . Enoxaparin Sodium     REACTION: thrombocytopenia  . Heparin     REACTION: thrombocytopenia  . Lactose Intolerance ([place])   . Other     Sensitive to dye in Betadine & Chlorohexadine   . Povidone-Iodine     Sensitivity- but if its wiped off she is able to tolerate betadine   . Indomethacin Rash  . Phenylpropanolamine Rash    Other Reaction: other reaction  . Povidone Iodine Rash    Patient Measurements: Height: 5' (152.4 cm) Weight: 168 lb 14 oz (76.6 kg) IBW/kg (Calculated) : 45.5  Vital Signs: Temp: 97.9 F (36.6 C) (08/27 0700) BP: 139/58 (08/27 0800) Pulse Rate: 71 (08/27 0800)  Labs:  Recent Labs  06/15/16 0340  06/16/16 0444 06/16/16 0445 06/16/16 1726 06/17/16 0500  HGB 9.4*  --   --  8.7*  --   --   HCT 29.5*  --   --  27.6*  --   --   PLT 201  --   --  190  --   --   APTT 69*  < >  --  82* 73* 74*  CREATININE 3.63*  3.69*  --  3.24*  --   --  2.81*  < > = values in this interval not displayed.  Estimated Creatinine Clearance: 17.3 mL/min (by C-G formula based on SCr of 2.81 mg/dL).   Assessment: Patient is a 70 yo female who presented 8/14 with sepsis of presumed biliary origin. She was on rivaroxaban PTA for AFib with last dose 8/13PM. She also has h/o antiphospholipid syndrome w/ h/o DVT/PE. She has documented allergy to both heparin and enoxaparin. LFTs and T bili were elevated on admission, therefore not using argatroban.   APTT today is 74 (therapeutic). H/H stable, Plt wnl.  Goal of Therapy:  Goal aPTT: 50-85 seconds Monitor platelets by anticoagulation protocol: Yes   Plan:  Continue bivalirudin 0.05 mg / kg / hr IV infusion Monitor aPTT every 12 hours  Monitor CBC and s/sx of  bleeding F/u ability to change to PO (Xarelto PTA)  Dimitri Ped, PharmD. PGY-2 Pharmacy Resident Pager: [phone] 06/17/2016 9:47 AM   8/27 1600 PM PM PTT therapeutic  No change in bivalirudin rate  Thank you Anette Guarneri, PharmD [phone]

## 2016-06-18 DIAGNOSIS — E876 Hypokalemia: Secondary | ICD-10-CM

## 2016-06-18 DIAGNOSIS — I5043 Acute on chronic combined systolic (congestive) and diastolic (congestive) heart failure: Secondary | ICD-10-CM

## 2016-06-18 LAB — CBC WITH DIFFERENTIAL/PLATELET
BASOS ABS: 0 10*3/uL (ref 0.0–0.1)
Basophils Relative: 0 %
Eosinophils Absolute: 0.2 10*3/uL (ref 0.0–0.7)
Eosinophils Relative: 2 %
HEMATOCRIT: 26.7 % — AB (ref 36.0–46.0)
HEMOGLOBIN: 8.5 g/dL — AB (ref 12.0–15.0)
LYMPHS PCT: 13 %
Lymphs Abs: 1.5 10*3/uL (ref 0.7–4.0)
MCH: 27.2 pg (ref 26.0–34.0)
MCHC: 31.8 g/dL (ref 30.0–36.0)
MCV: 85.6 fL (ref 78.0–100.0)
MONO ABS: 0.9 10*3/uL (ref 0.1–1.0)
Monocytes Relative: 8 %
NEUTROS ABS: 9.1 10*3/uL — AB (ref 1.7–7.7)
NEUTROS PCT: 77 %
Platelets: 249 10*3/uL (ref 150–400)
RBC: 3.12 MIL/uL — AB (ref 3.87–5.11)
RDW: 14.8 % (ref 11.5–15.5)
WBC: 11.8 10*3/uL — AB (ref 4.0–10.5)

## 2016-06-18 LAB — COMPREHENSIVE METABOLIC PANEL
ALBUMIN: 2.5 g/dL — AB (ref 3.5–5.0)
ALT: 16 U/L (ref 14–54)
ANION GAP: 12 (ref 5–15)
AST: 14 U/L — ABNORMAL LOW (ref 15–41)
Alkaline Phosphatase: 83 U/L (ref 38–126)
BILIRUBIN TOTAL: 1.1 mg/dL (ref 0.3–1.2)
BUN: 65 mg/dL — ABNORMAL HIGH (ref 6–20)
CO2: 33 mmol/L — ABNORMAL HIGH (ref 22–32)
Calcium: 8.6 mg/dL — ABNORMAL LOW (ref 8.9–10.3)
Chloride: 91 mmol/L — ABNORMAL LOW (ref 101–111)
Creatinine, Ser: 2.74 mg/dL — ABNORMAL HIGH (ref 0.44–1.00)
GFR calc non Af Amer: 17 mL/min — ABNORMAL LOW (ref >60)
GFR, EST AFRICAN AMERICAN: 19 mL/min — AB (ref >60)
GLUCOSE: 130 mg/dL — AB (ref 65–99)
POTASSIUM: 3.1 mmol/L — AB (ref 3.5–5.1)
Sodium: 136 mmol/L (ref 135–145)
TOTAL PROTEIN: 6.2 g/dL — AB (ref 6.5–8.1)

## 2016-06-18 LAB — GLUCOSE, CAPILLARY
GLUCOSE-CAPILLARY: 137 mg/dL — AB (ref 65–99)
GLUCOSE-CAPILLARY: 155 mg/dL — AB (ref 65–99)
Glucose-Capillary: 108 mg/dL — ABNORMAL HIGH (ref 65–99)
Glucose-Capillary: 130 mg/dL — ABNORMAL HIGH (ref 65–99)
Glucose-Capillary: 134 mg/dL — ABNORMAL HIGH (ref 65–99)
Glucose-Capillary: 165 mg/dL — ABNORMAL HIGH (ref 65–99)

## 2016-06-18 LAB — PHOSPHORUS: PHOSPHORUS: 4.7 mg/dL — AB (ref 2.5–4.6)

## 2016-06-18 LAB — MAGNESIUM: Magnesium: 1.9 mg/dL (ref 1.7–2.4)

## 2016-06-18 LAB — APTT: aPTT: 73 seconds — ABNORMAL HIGH (ref 24–36)

## 2016-06-18 MED ORDER — SODIUM CHLORIDE 0.9% FLUSH
10.0000 mL | INTRAVENOUS | Status: DC | PRN
  Administered 2016-06-20 – 2016-06-21 (×2): 10 mL
  Filled 2016-06-18 (×2): qty 40

## 2016-06-18 MED ORDER — OXYCODONE HCL 5 MG PO TABS
5.0000 mg | ORAL_TABLET | Freq: Three times a day (TID) | ORAL | Status: DC | PRN
  Administered 2016-06-18 – 2016-06-21 (×8): 5 mg via ORAL
  Filled 2016-06-18 (×8): qty 1

## 2016-06-18 MED ORDER — POTASSIUM CHLORIDE 10 MEQ/50ML IV SOLN
10.0000 meq | INTRAVENOUS | Status: AC
  Administered 2016-06-18: 10 meq via INTRAVENOUS

## 2016-06-18 MED ORDER — POTASSIUM CHLORIDE 10 MEQ/50ML IV SOLN
10.0000 meq | INTRAVENOUS | Status: AC
  Administered 2016-06-18 (×3): 10 meq via INTRAVENOUS
  Filled 2016-06-18 (×4): qty 50

## 2016-06-18 NOTE — Progress Notes (Addendum)
[place] for Bivalirudin  Indication: atrial fibrillation   Allergies  Allergen Reactions  . Hornet Venom Anaphylaxis  . Pork-Derived Products Anaphylaxis  . Chlorhexidine Itching    Reports that it is the dye in it  . Enoxaparin Sodium     REACTION: thrombocytopenia  . Heparin     REACTION: thrombocytopenia  . Lactose Intolerance (Gi)   . Other     Sensitive to dye in Betadine & Chlorohexadine   . Povidone-Iodine     Sensitivity- but if its wiped off she is able to tolerate betadine   . Indomethacin Rash  . Phenylpropanolamine Rash    Other Reaction: other reaction  . Povidone Iodine Rash    Patient Measurements: Height: 5' (152.4 cm) Weight: 187 lb 8 oz (85 kg) IBW/kg (Calculated) : 45.5  Vital Signs: Temp: 97.7 F (36.5 C) (08/28 0441) Temp Source: Oral (08/28 0441) BP: 145/50 (08/28 0441) Pulse Rate: 76 (08/28 0441)  Assessment: Patient is a 70 yo female who presented 8/14 with sepsis of presumed biliary origin. On Bival for hx afib (Xarelto PTA) Of note patient with antiphospholipid syndrome which can prolong the aPTT at baseline but no baseline level on file. Chart progress notes mention hx HIT but allergies mention thrombocytopenia (no HIT testing noted in our records) and anaphylaxis to pork products). aPTT 73 (goal 50-85) Talked with RN this am, will replace bag with new one since bag only gets 24 hrs and patient not using full bag in a 24 hr period. Note: pt was on warfarin in past but was stopped d/t trouble regulating INRs per 2/28 cards note. Hgb low but stable at 8.5, plts wnl.   Goal of Therapy:  Goal aPTT: 50-85 seconds Monitor platelets by anticoagulation protocol: Yes   Plan:  Continue Bivalirudin at 0.05 mg/kg/hr (3.78mg /hr) Monitor aPTT daily, CBC, s/s of bleed F/U ability to take PO to transition back to [place], PharmD, [place] Pharmacist Pager [phone] 06/18/2016 11:06 AM

## 2016-06-18 NOTE — Care Management Important Message (Signed)
Important Message  Patient Details  Name: Latoya [name] MRN: [id number] Date of Birth: [birth year]   Medicare Important Message Given:  Yes    Nathen May 06/18/2016, 12:07 PM

## 2016-06-18 NOTE — Progress Notes (Signed)
Patient lying in bed, no needs at this time. Pain meds prev given, pain is improving. Call light within reach.

## 2016-06-18 NOTE — Evaluation (Signed)
Physical Therapy Evaluation Patient Details Name: Latoya [name] MRN: [id number] DOB: [birth year] Today's Date: 06/18/2016   History of Present Illness  70 yo female with onset of septic shock and pleural effusion, has hemangioma of liver and SOB, acute respiratory failure, pulmonary edema and lactic acidosis.  PMHx:  CAD, a-fib with RVR, CHF  Clinical Impression  Pt was seen for initial mobility but not able to sit up bedside effectively with PT due to weakness.  Her plan is to work toward more sitting and standing tolerance and then will be faster to complete her SNF care.  Follow acutely for strengthening of LE's as well, to manage position of LE's for pain decrease.      Follow Up Recommendations SNF    Equipment Recommendations  None recommended by PT    Recommendations for Other Services Rehab consult     Precautions / Restrictions Precautions Precautions: Fall (telemetry, O2) Restrictions Weight Bearing Restrictions: No      Mobility  Bed Mobility Overal bed mobility: Needs Assistance Bed Mobility: Supine to Sit;Sit to Supine     Supine to sit: Mod assist;Max assist Sit to supine: Mod assist;Max assist   General bed mobility comments: pt unable to assist effectively with UE's  Transfers                 General transfer comment: pt was too tired to move herself with PT to stand  Ambulation/Gait             General Gait Details: unable but I previously  Science writer    Modified Rankin (Stroke Patients Only)       Balance Overall balance assessment: Needs assistance Sitting-balance support: Feet supported Sitting balance-Leahy Scale: Poor                                       Pertinent Vitals/Pain Pain Assessment: Faces Faces Pain Scale: Hurts little more Pain Location: B leg joints from OA Pain Descriptors / Indicators: Aching Pain Intervention(s): Limited activity within patient's  tolerance;Monitored during session;Repositioned    Home Living Family/patient expects to be discharged to:: Skilled nursing facility Living Arrangements: Spouse/significant other Available Help at Discharge: Family;Available 24 hours/day Type of Home: House                Prior Function Level of Independence: Independent               Hand Dominance        Extremity/Trunk Assessment                         Communication   Communication: No difficulties  Cognition Arousal/Alertness: Lethargic Behavior During Therapy: Flat affect Overall Cognitive Status: No family/caregiver present to determine baseline cognitive functioning       Memory: Decreased recall of precautions              General Comments General comments (skin integrity, edema, etc.): Pt was up to side of bed with some generalized weakness impeding standing and transition to chair (pt declined to try)    Exercises        Assessment/Plan    PT Assessment Patient needs continued PT services  PT Diagnosis Generalized weakness   PT Problem List Decreased strength;Decreased range of motion;Decreased activity tolerance;Decreased balance;Decreased mobility;Decreased coordination;Decreased  safety awareness;Cardiopulmonary status limiting activity;Pain;Decreased skin integrity  PT Treatment Interventions DME instruction;Gait training;Functional mobility training;Therapeutic activities;Therapeutic exercise;Balance training;Neuromuscular re-education;Patient/family education   PT Goals (Current goals can be found in the Care Plan section) Acute Rehab PT Goals Patient Stated Goal: to try to move PT Goal Formulation: With patient Time For Goal Achievement: 07/02/16 Potential to Achieve Goals: Good    Frequency Min 2X/week   Barriers to discharge Other (comment) (will need 2 person assist to succeed at home)      Co-evaluation               End of Session Equipment Utilized  During Treatment: Oxygen Activity Tolerance: Patient tolerated treatment well;Patient limited by fatigue;Patient limited by lethargy Patient left: in bed;with call bell/phone within reach;with bed alarm set Nurse Communication: Mobility status         Time: [id number] PT Time Calculation (min) (ACUTE ONLY): 32 min   Charges:   PT Evaluation $PT Eval Moderate Complexity: 1 Procedure PT Treatments $Therapeutic Activity: 8-22 mins   PT G Codes:        Ramond Dial 07-18-2016, 11:27 AM    Mee Hives, PT MS Acute Rehab Dept. Number: [place] [facility] [place]

## 2016-06-18 NOTE — Progress Notes (Signed)
PROGRESS NOTE    Latoya [name]  [id number] DOB: 09-Jul-1946 DOA: 06/04/2016 PCP: Thressa Sheller, MD   Brief Narrative:  70 y/o WF PMHx Antiphospholipid Syndrome on AC (hx of one provoked DVT/PE), as well as CAD native artery, HTN,  PAF, HLD,, Erosive gastritis , Asthma,Spondylosis, lumbosacral.  Who presented to the ED with sudden onset of chills and abdominal pain. In the ED her clinical status declined, becoming less responsive, more tachycardic and more tachypneic. Imaging studies suggested an enlarged gallbladder.   Subjective: Remains afebrile, no CP or significant SOB. Almost reaching euvolemic state. Still with nausea, but no vomiting. Patient with very poor appetite.  Assessment & Plan:   Principal Problem:   Ascending cholangitis Active Problems:   PAROXYSMAL ATRIAL FIBRILLATION   Sepsis ([place])   Hemangioma of liver   Severe sepsis (HCC)   Septic shock (HCC)   Acute respiratory failure with hypoxia (HCC)   Enterococcal bacteremia   Bacteremia due to Klebsiella pneumoniae   Bacteremia due to Escherichia coli   Acute respiratory failure (HCC)   Cholangitis   Gastroparesis  Severe sepsis, probable biliary origin -s/p cholecystostomy  -has completed antibiotics therapy -will follow general surgery rec's  Moderate persistent asthma Continue PRN nebulizer -no wheezing currently -breathing stable  Chronic systolic and diastolic CHF -Lasix 80 mg BID for one more day; close to euvolemic state -continue Hydralazine PRN -Metoprolol 5 mg IV QID -Strict in and out -Daily weight  CAD native artery   HTN -Controlled  Paroxysmal Atrial Fibrillation -Currently has remained in normal sinus rhythm -Continue Angiomax per pharmacy -Continue amiodarone drip: Consider transitioning to PO when patient's PO status is acceptable and no further nausea/vomiting.  Acute renal failure -Baseline Cr unk -improving  -appreciate nephrology rec's Lab Results  Component  Value Date   CREATININE 2.74 (H) 06/18/2016   CREATININE 2.81 (H) 06/17/2016   CREATININE 3.24 (H) 06/16/2016   Antiphospholipid Ab Syndrome -continue Angiomax, Xarelto held for procedure; will resume when ok by surgery -patient allergic to heparin products   Hypothyroidism -continue Synthroid 37 g daily  Hypokalemia  -Potassium goal> 4 -will replete as needed  -secondary to cholecystostomy drainage and diuresis   Hypomagnesemia  -Magnesium goal> 2 -will continue repletion as needed -most likely secondary to Poor PO intake and also active cholecystostomy drainage   Dysphagia -will follow SPL diet consistency recommendations -currently on dysphagia 2 with thin liquids.  DVT prophylaxis: Angiomax Code Status: DO NOT RESUSCITATE Family Communication: husband at bedside  Disposition Plan: PT recommending SNF when medically stable for rehabilitation.SNF?   Consultants:  PCCM Dr. Edrick Oh, nephrology Dr. Erroll Luna CCS  Procedures/Significant Events:  8/14 ultrasound abdomen RUQ:-. 7.9 x 7.8 x 8.1 cm complex hepatic mass, 8/14 CT angiogram abdomen and pelvis:-2. Diffuse gallbladder distention, and dilatation of the common bile duct to 1.3 cm in diameter,concerning for distal obstruction. - Intrahepatic biliary ductal dilatation- 7.1 cm right hepatic mass, with prominent peripheral vasculature, likely reflects a large benign hemangioma. This has increased from 4.3 cm in 2007. - Chronic rupture of the patient's right-sided breast implant, stable from 2007, with leakage into the right lateral breast, 8/14 [place] Percutaneous transhepatic cholecystostomy. 8/20 echocardiogram:- LVEF =  50%. -(grade 2 diastolic dysfunction). - Mitral valve: moderate regurgitation -- Left atrium: moderately dilated.   Cultures Blood 8/14 >> positive E. Coli and enterococcous and klebsiella. Urine 8/14 >>NTD. Abscess 8/14>>> positive E.Coli and enterococcus (both pan sens)     Antimicrobials: Unasyn 8/17>> 8/21 Vanc 8/14 >> 8/16  Zosyn 8/14 >>8/17   LINES / TUBES:  ETT 7.5 mm 8/14>>>8/18, 8/18>>> 8/24 Foley 8/14>>> R IJ TLC 8/14>>> Gall bladder drain (IR) 8/14>>>   Continuous Infusions: . amiodarone 30 mg/hr (06/18/16 1024)  . bivalirudin (ANGIOMAX) infusion 0.5 mg/mL (Non-ACS indications) 0.05 mg/kg/hr (06/18/16 1358)  . dextrose 75 mL/hr at 06/18/16 1024    Objective: Vitals:   06/17/16 2031 06/18/16 0102 06/18/16 0441 06/18/16 1216  BP:  (!) 135/51 (!) 145/50 (!) 125/58  Pulse: 81 75 76 73  Resp: 16  20 18   Temp:   97.7 F (36.5 C) 98.4 F (36.9 C)  TempSrc:   Oral Oral  SpO2: 95%  97% 97%  Weight:   85 kg (187 lb 8 oz)   Height:        Intake/Output Summary (Last 24 hours) at 06/18/16 1808 Last data filed at 06/18/16 0900  Gross per 24 hour  Intake              240 ml  Output             2370 ml  Net            -2130 ml   Filed Weights   06/16/16 0500 06/17/16 1928 06/18/16 0441  Weight: 76.6 kg (168 lb 14 oz) 81.1 kg (178 lb 12.8 oz) 85 kg (187 lb 8 oz)    Examination:  General: Patient sleepy, no having CP or SOB. Still with very poor appetite. Eyes: negative scleral hemorrhage, negative anisocoria, negative icterus ENT: no thrush, no erythema or exudates Lungs: Clear to auscultation bilaterally Cardiovascular: Regular rate and rhythm without murmur gallop or rub normal S1 and S2 Abdomen: negative abdominal pain, nondistended, positive soft, bowel sounds, no rebound, no ascites, percutaneous cholecystostomy drain in place draining watery brown fluid, negative sign of blood/pus (approx 120-150 CC) Extremities: No significant cyanosis, clubbing, or edema on lower extremities Skin: cholecystostomy tube in place, Negative rashes and no pressure ulcers appreciated. Psychiatric:  Negative depression, negative anxiety, negative fatigue, negative mania Central nervous system:  Cranial nerves II through XII intact, tongue/uvula  midline, patient with limited cervical range of motion secondary to multiple C-spine surgeries, in all extremities muscle strength 4/5, sensation intact throughout, negative dysarthria.  Data Reviewed: Care during the described time interval was provided by me .  I have reviewed this patient's available data, including medical history, events of note, physical examination, and all test results as part of my evaluation. I have personally reviewed and interpreted all radiology studies.  CBC:  Recent Labs Lab 06/12/16 0345 06/13/16 0440 06/15/16 0340 06/16/16 0445 06/18/16 0516  WBC 13.7* 10.1 15.7* 12.9* 11.8*  NEUTROABS  --   --   --   --  9.1*  HGB 9.3* 8.3* 9.4* 8.7* 8.5*  HCT 28.0* 25.3* 29.5* 27.6* 26.7*  MCV 83.3 84.1 86.8 86.5 85.6  PLT 145* 175 201 190 0000000   Basic Metabolic Panel:  Recent Labs Lab 06/14/16 0500 06/15/16 0340 06/16/16 0444 06/17/16 0500 06/17/16 0834 06/17/16 1535 06/18/16 0516  NA 149* 152*  151* 149* 143  --   --  136  K 3.2* 3.0*  3.0* 2.4* 2.5*  --  3.9 3.1*  CL 104 104  105 101 96*  --   --  91*  CO2 31 29  29  35* 37*  --   --  33*  GLUCOSE 128* 114*  116* 147* 122*  --   --  130*  BUN 121* 111*  111* 93* 76*  --   --  65*  CREATININE 3.91* 3.63*  3.69* 3.24* 2.81*  --   --  2.74*  CALCIUM 8.2* 8.7*  8.7* 9.1 8.6*  --   --  8.6*  MG  --  2.2  --   --  1.6* 1.8 1.9  PHOS 8.4* 7.1*  7.1* 5.3* 5.3*  --   --  4.7*   GFR: Estimated Creatinine Clearance: 18.8 mL/min (by C-G formula based on SCr of 2.74 mg/dL).   Liver Function Tests:  Recent Labs Lab 06/14/16 0500 06/15/16 0340 06/16/16 0444 06/17/16 0500 06/18/16 0516  AST  --   --   --   --  14*  ALT  --   --   --   --  16  ALKPHOS  --   --   --   --  83  BILITOT  --   --   --   --  1.1  PROT  --   --   --   --  6.2*  ALBUMIN 2.3* 2.3* 2.4* 2.3* 2.5*   CBG:  Recent Labs Lab 06/18/16 0042 06/18/16 0448 06/18/16 0827 06/18/16 1214 06/18/16 1618  GLUCAP 134* 137* 108*  155* 130*    Urine analysis:    Component Value Date/Time   COLORURINE YELLOW 06/07/2016 1223   APPEARANCEUR CLOUDY (A) 06/07/2016 1223   LABSPEC >1.030 (H) 06/07/2016 1223   PHURINE 5.5 06/07/2016 1223   GLUCOSEU NEGATIVE 06/07/2016 1223   HGBUR LARGE (A) 06/07/2016 1223   BILIRUBINUR NEGATIVE 06/07/2016 1223   BILIRUBINUR neg 02/08/2014 1955   KETONESUR NEGATIVE 06/07/2016 1223   PROTEINUR 30 (A) 06/07/2016 1223   UROBILINOGEN 0.2 02/08/2014 1955   UROBILINOGEN 2.0 (H) 02/06/2009 1620   NITRITE NEGATIVE 06/07/2016 1223   LEUKOCYTESUR TRACE (A) 06/07/2016 1223   Sepsis Labs: @LABRCNTIP (procalcitonin:4,lacticidven:4)  ) Recent Results (from the past 240 hour(s))  Culture, respiratory (NON-Expectorated)     Status: None   Collection Time: 06/09/16  3:20 AM  Result Value Ref Range Status   Specimen Description TRACHEAL ASPIRATE  Final   Special Requests NONE  Final   Gram Stain   Final    ABUNDANT WBC PRESENT, PREDOMINANTLY PMN RARE YEAST    Culture FEW CANDIDA ALBICANS RARE CANDIDA GLABRATA   Final   Report Status 06/11/2016 FINAL  Final  C difficile quick scan w PCR reflex     Status: None   Collection Time: 06/11/16 10:52 AM  Result Value Ref Range Status   C Diff antigen NEGATIVE NEGATIVE Final   C Diff toxin NEGATIVE NEGATIVE Final   C Diff interpretation No C. difficile detected.  Final     Radiology Studies: No results found.    Scheduled Meds: . arformoterol  15 mcg Nebulization BID  . budesonide (PULMICORT) nebulizer solution  0.5 mg Nebulization BID  . famotidine (PEPCID) IV  20 mg Intravenous Q24H  . furosemide  80 mg Intravenous Q12H  . insulin aspart  0-9 Units Subcutaneous Q4H  . levothyroxine  37 mcg Intravenous Daily  . mouth rinse  15 mL Mouth Rinse BID  . metoprolol  5 mg Intravenous Q6H   Continuous Infusions: . amiodarone 30 mg/hr (06/18/16 1024)  . bivalirudin (ANGIOMAX) infusion 0.5 mg/mL (Non-ACS indications) 0.05 mg/kg/hr (06/18/16  1358)  . dextrose 75 mL/hr at 06/18/16 1024     LOS: 14 days    Time spent: 25 minutes    Barton Dubois, MD [facility] Pager [phone]  If 7PM-7AM, please contact night-coverage [email or web address] Password [email or web address] 06/18/2016, 6:08 PM     `

## 2016-06-19 DIAGNOSIS — G894 Chronic pain syndrome: Secondary | ICD-10-CM

## 2016-06-19 DIAGNOSIS — R1310 Dysphagia, unspecified: Secondary | ICD-10-CM

## 2016-06-19 LAB — CBC WITH DIFFERENTIAL/PLATELET
BASOS PCT: 0 %
Basophils Absolute: 0 10*3/uL (ref 0.0–0.1)
Eosinophils Absolute: 0.2 10*3/uL (ref 0.0–0.7)
Eosinophils Relative: 2 %
HCT: 26.2 % — ABNORMAL LOW (ref 36.0–46.0)
Hemoglobin: 8.6 g/dL — ABNORMAL LOW (ref 12.0–15.0)
LYMPHS ABS: 1.2 10*3/uL (ref 0.7–4.0)
LYMPHS PCT: 12 %
MCH: 27.7 pg (ref 26.0–34.0)
MCHC: 32.8 g/dL (ref 30.0–36.0)
MCV: 84.5 fL (ref 78.0–100.0)
MONO ABS: 1.3 10*3/uL — AB (ref 0.1–1.0)
MONOS PCT: 13 %
NEUTROS ABS: 7.6 10*3/uL (ref 1.7–7.7)
NEUTROS PCT: 73 %
PLATELETS: 263 10*3/uL (ref 150–400)
RBC: 3.1 MIL/uL — ABNORMAL LOW (ref 3.87–5.11)
RDW: 14.6 % (ref 11.5–15.5)
WBC: 10.3 10*3/uL (ref 4.0–10.5)

## 2016-06-19 LAB — GLUCOSE, CAPILLARY
GLUCOSE-CAPILLARY: 142 mg/dL — AB (ref 65–99)
GLUCOSE-CAPILLARY: 165 mg/dL — AB (ref 65–99)
Glucose-Capillary: 121 mg/dL — ABNORMAL HIGH (ref 65–99)
Glucose-Capillary: 124 mg/dL — ABNORMAL HIGH (ref 65–99)
Glucose-Capillary: 131 mg/dL — ABNORMAL HIGH (ref 65–99)
Glucose-Capillary: 161 mg/dL — ABNORMAL HIGH (ref 65–99)

## 2016-06-19 LAB — MAGNESIUM: MAGNESIUM: 1.8 mg/dL (ref 1.7–2.4)

## 2016-06-19 LAB — COMPREHENSIVE METABOLIC PANEL
ALBUMIN: 2.5 g/dL — AB (ref 3.5–5.0)
ALK PHOS: 85 U/L (ref 38–126)
ALT: 14 U/L (ref 14–54)
ANION GAP: 11 (ref 5–15)
AST: 14 U/L — ABNORMAL LOW (ref 15–41)
BUN: 56 mg/dL — ABNORMAL HIGH (ref 6–20)
CHLORIDE: 91 mmol/L — AB (ref 101–111)
CO2: 36 mmol/L — AB (ref 22–32)
Calcium: 8.7 mg/dL — ABNORMAL LOW (ref 8.9–10.3)
Creatinine, Ser: 2.69 mg/dL — ABNORMAL HIGH (ref 0.44–1.00)
GFR calc non Af Amer: 17 mL/min — ABNORMAL LOW (ref >60)
GFR, EST AFRICAN AMERICAN: 20 mL/min — AB (ref >60)
GLUCOSE: 120 mg/dL — AB (ref 65–99)
POTASSIUM: 3 mmol/L — AB (ref 3.5–5.1)
SODIUM: 138 mmol/L (ref 135–145)
Total Bilirubin: 0.6 mg/dL (ref 0.3–1.2)
Total Protein: 5.9 g/dL — ABNORMAL LOW (ref 6.5–8.1)

## 2016-06-19 LAB — PHOSPHORUS: Phosphorus: 5.4 mg/dL — ABNORMAL HIGH (ref 2.5–4.6)

## 2016-06-19 LAB — APTT: APTT: 74 s — AB (ref 24–36)

## 2016-06-19 MED ORDER — FUROSEMIDE 40 MG PO TABS
40.0000 mg | ORAL_TABLET | Freq: Two times a day (BID) | ORAL | Status: DC
  Administered 2016-06-19 – 2016-06-20 (×2): 40 mg via ORAL
  Filled 2016-06-19 (×2): qty 1

## 2016-06-19 MED ORDER — GLUCERNA SHAKE PO LIQD: 237.0000 mL | Freq: Two times a day (BID) | ORAL | Status: DC

## 2016-06-19 MED ORDER — POTASSIUM CHLORIDE CRYS ER 20 MEQ PO TBCR
40.0000 meq | EXTENDED_RELEASE_TABLET | Freq: Two times a day (BID) | ORAL | Status: DC
  Administered 2016-06-19 – 2016-06-21 (×4): 40 meq via ORAL
  Filled 2016-06-19 (×4): qty 2

## 2016-06-19 NOTE — Progress Notes (Signed)
Speech Language Pathology Treatment: Dysphagia  Patient Details Name: Latoya [name] MRN: [id number] DOB: 08-05-1946 Today's Date: 06/19/2016 Time: 1140-1156 SLP Time Calculation (min) (ACUTE ONLY): 16 min  Assessment / Plan / Recommendation Clinical Impression  Pt seen for dysphagia followup. Pt able to communicate swallow precautions established after MBSS with min verbal clarification questions. Pt reports very poor appetite and intermittent nausea and was agreeable to minimal [name]. Pt able to observe small cup sips at mod I and managed puree with double swallow. Pt declined any trials of solids. Discussed need to increase pharyngeal strength and pt indicated that she might be willing to trial more advanced textures in the next session. Will follow.    HPI HPI: 70 y/o woman with respiratory failure, severe sepsis of presumed biliary origin and AKI. She required intubation 8/14-8/18, and again 8/18-8/23. PMH: PE, PAF, HTN, HLD, HH, GERD, CAD, asthma, spondylosis requiring cervical fusion C6-C7, and cleft lip/partial palate s/p surgical intervention as a child      SLP Plan        Recommendations  Diet recommendations: Dysphagia 2 (fine chop);Thin liquid Liquids provided via: Cup Medication Administration: Crushed with puree Supervision: Patient able to self feed;Intermittent supervision to cue for compensatory strategies Compensations: Slow rate;Small sips/bites;Follow solids with liquid;Clear throat intermittently Postural Changes and/or Swallow Maneuvers: Seated upright 90 degrees             Oral Care Recommendations: Oral care BID Follow up Recommendations: Skilled Nursing facility     [place], [place] Pager [phone] 06/19/2016, 1:24 PM

## 2016-06-19 NOTE — Progress Notes (Signed)
PROGRESS NOTE    Latoya [name]  [id number] DOB: 1946/07/29 DOA: 06/04/2016 PCP: Thressa Sheller, MD   Brief Narrative:  70 y/o WF PMHx Antiphospholipid Syndrome on AC (hx of one provoked DVT/PE), as well as CAD native artery, HTN,  PAF, HLD, Erosive gastritis , Asthma,Spondylosis, lumbosacral.  Who presented to the ED with sudden onset of chills and abdominal pain. In the ED her clinical status declined, becoming less responsive, more tachycardic and more tachypneic. Imaging studies suggested an enlarged gallbladder.   Subjective: Remains afebrile, no CP or significant SOB. Essentially euvolemic currently. Still with very poor PO intake and extremely weak. Reports some nausea.  Assessment & Plan:   Principal Problem:   Ascending cholangitis Active Problems:   PAROXYSMAL ATRIAL FIBRILLATION   Sepsis ([place])   Hemangioma of liver   Severe sepsis (HCC)   Septic shock (HCC)   Acute respiratory failure with hypoxia (HCC)   Enterococcal bacteremia   Bacteremia due to Klebsiella pneumoniae   Bacteremia due to Escherichia coli   Acute respiratory failure (HCC)   Cholangitis   Gastroparesis  Severe sepsis, probable biliary origin -s/p cholecystostomy  -has completed antibiotics therapy -will follow general surgery rec's  Moderate persistent asthma -Continue PRN nebulizer -no wheezing currently -breathing stable  Chronic systolic and diastolic CHF -Lasix 40mg  PO BID started on 8/29; no crackles and no significant edema -continue Hydralazine PRN -continue Metoprolol 5 mg IV QID; stil with problems swallowing  -Strict in and out -Daily weight  CAD native artery  -no CP -continue monitoring on telemetry   HTN -well Controlled, will monitor   Paroxysmal Atrial Fibrillation -Currently has remained in normal sinus rhythm -Continue Angiomax per pharmacy -Continue amiodarone drip: Consider transitioning to PO when patient's PO status is acceptable and no further  nausea/vomiting.  Acute renal failure -Baseline Cr unk -continue improving  -appreciate nephrology rec's  Lab Results  Component Value Date   CREATININE 2.69 (H) 06/19/2016   CREATININE 2.74 (H) 06/18/2016   CREATININE 2.81 (H) 06/17/2016   Antiphospholipid Ab Syndrome -continue Angiomax, Xarelto held for procedure; will resume when ok by surgery -patient allergic to heparin products   Hypothyroidism -continue Synthroid 37 g daily  Hypokalemia  -Potassium goal> 4 -will replete as needed  -secondary to cholecystostomy drainage and diuresis   Hypomagnesemia  -Magnesium goal> 2 -will continue repletion as needed -most likely secondary to Poor PO intake and also active cholecystostomy drainage   Dysphagia -will follow SPL diet consistency recommendations -currently on dysphagia 2 with thin liquids. -poor appetite   Chronic pain and multiple cervical surgeries  -will continue use of oxycodone and slowly resume home pain regimen as needed   DVT prophylaxis: Angiomax Code Status: DO NOT RESUSCITATE Family Communication: husband at bedside  Disposition Plan: PT recommending SNF when medically stable for rehabilitation. Hopefully in the next 48-72 hours.   Consultants:  PCCM Dr. Edrick Oh, nephrology Dr. Erroll Luna CCS  Procedures/Significant Events:  8/14 ultrasound abdomen RUQ:-. 7.9 x 7.8 x 8.1 cm complex hepatic mass, 8/14 CT angiogram abdomen and pelvis:-2. Diffuse gallbladder distention, and dilatation of the common bile duct to 1.3 cm in diameter,concerning for distal obstruction. - Intrahepatic biliary ductal dilatation- 7.1 cm right hepatic mass, with prominent peripheral vasculature, likely reflects a large benign hemangioma. This has increased from 4.3 cm in 2007. - Chronic rupture of the patient's right-sided breast implant, stable from 2007, with leakage into the right lateral breast, 8/14 [place] Percutaneous transhepatic cholecystostomy. 8/20  echocardiogram:- LVEF =  50%. -(grade 2 diastolic dysfunction). - Mitral valve: moderate regurgitation -- Left atrium: moderately dilated.   Cultures Blood 8/14 >> positive E. Coli and enterococcous and klebsiella. Urine 8/14 >>NTD. Abscess 8/14>>> positive E.Coli and enterococcus (both pan sens)    Antimicrobials: Unasyn 8/17>> 8/21 Vanc 8/14 >> 8/16 Zosyn 8/14 >>8/17   LINES / TUBES:  ETT 7.5 mm 8/14>>>8/18, 8/18>>> 8/24 Foley 8/14>>> R IJ TLC 8/14>>> Gall bladder drain (IR) 8/14>>>   Continuous Infusions: . amiodarone 30 mg/hr (06/19/16 1114)  . bivalirudin (ANGIOMAX) infusion 0.5 mg/mL (Non-ACS indications) 0.05 mg/kg/hr (06/19/16 1113)  . dextrose 75 mL/hr at 06/18/16 2322    Objective: Vitals:   06/18/16 1931 06/18/16 1933 06/18/16 1938 06/19/16 0427  BP:   (!) 149/51 (!) 148/62  Pulse:   77 76  Resp:   20   Temp:   98.1 F (36.7 C) 97.9 F (36.6 C)  TempSrc:   Oral Oral  SpO2: 94% 94% 93% 96%  Weight:    78.6 kg (173 lb 3.2 oz)  Height:        Intake/Output Summary (Last 24 hours) at 06/19/16 1704 Last data filed at 06/19/16 1130  Gross per 24 hour  Intake                0 ml  Output             3850 ml  Net            -3850 ml   Filed Weights   06/17/16 1928 06/18/16 0441 06/19/16 0427  Weight: 81.1 kg (178 lb 12.8 oz) 85 kg (187 lb 8 oz) 78.6 kg (173 lb 3.2 oz)    Examination:  General: Patient oriented X3, in no distress, moving 4 limbs, no having CP or SOB. Still with very poor appetite and intermittent episodes of nausea. Eyes: negative scleral hemorrhage, negative anisocoria, negative icterus ENT: no thrush, no erythema or exudates Lungs: Clear to auscultation bilaterally Cardiovascular: Regular rate and rhythm without murmur gallop or rub normal S1 and S2 Abdomen: negative abdominal pain, nondistended, positive soft, bowel sounds, no rebound, no ascites, percutaneous cholecystostomy drain in place draining watery brown fluid, negative  sign of blood/pus (approx 120-150 CC) Extremities: No significant cyanosis, clubbing, or edema on lower extremities Skin: cholecystostomy tube in place, Negative rashes and no pressure ulcers appreciated. Psychiatric:  Negative depression, negative anxiety, negative fatigue, negative mania Central nervous system:  Cranial nerves II through XII intact, tongue/uvula midline, patient with limited cervical range of motion secondary to multiple C-spine surgeries, in all extremities muscle strength 4/5, sensation intact throughout, negative dysarthria.  Data Reviewed: Care during the described time interval was provided by me .  I have reviewed this patient's available data, including medical history, events of note, physical examination, and all test results as part of my evaluation. I have personally reviewed and interpreted all radiology studies.  CBC:  Recent Labs Lab 06/13/16 0440 06/15/16 0340 06/16/16 0445 06/18/16 0516 06/19/16 0420  WBC 10.1 15.7* 12.9* 11.8* 10.3  NEUTROABS  --   --   --  9.1* 7.6  HGB 8.3* 9.4* 8.7* 8.5* 8.6*  HCT 25.3* 29.5* 27.6* 26.7* 26.2*  MCV 84.1 86.8 86.5 85.6 84.5  PLT 175 201 190 249 [id number]   Basic Metabolic Panel:  Recent Labs Lab 06/15/16 0340 06/16/16 0444 06/17/16 0500 06/17/16 0834 06/17/16 1535 06/18/16 0516 06/19/16 0420  NA 152*  151* 149* 143  --   --  136 138  K 3.0*  3.0* 2.4* 2.5*  --  3.9 3.1* 3.0*  CL 104  105 101 96*  --   --  91* 91*  CO2 29  29 35* 37*  --   --  33* 36*  GLUCOSE 114*  116* 147* 122*  --   --  130* 120*  BUN 111*  111* 93* 76*  --   --  65* 56*  CREATININE 3.63*  3.69* 3.24* 2.81*  --   --  2.74* 2.69*  CALCIUM 8.7*  8.7* 9.1 8.6*  --   --  8.6* 8.7*  MG 2.2  --   --  1.6* 1.8 1.9 1.8  PHOS 7.1*  7.1* 5.3* 5.3*  --   --  4.7* 5.4*   GFR: Estimated Creatinine Clearance: 18.3 mL/min (by C-G formula based on SCr of 2.69 mg/dL).   Liver Function Tests:  Recent Labs Lab 06/15/16 0340 06/16/16 0444  06/17/16 0500 06/18/16 0516 06/19/16 0420  AST  --   --   --  14* 14*  ALT  --   --   --  16 14  ALKPHOS  --   --   --  83 85  BILITOT  --   --   --  1.1 0.6  PROT  --   --   --  6.2* 5.9*  ALBUMIN 2.3* 2.4* 2.3* 2.5* 2.5*   CBG:  Recent Labs Lab 06/19/16 0049 06/19/16 0423 06/19/16 0923 06/19/16 1141 06/19/16 1613  GLUCAP 121* 124* 131* 161* 142*    Urine analysis:    Component Value Date/Time   COLORURINE YELLOW 06/07/2016 1223   APPEARANCEUR CLOUDY (A) 06/07/2016 1223   LABSPEC >1.030 (H) 06/07/2016 1223   PHURINE 5.5 06/07/2016 1223   GLUCOSEU NEGATIVE 06/07/2016 1223   HGBUR LARGE (A) 06/07/2016 1223   BILIRUBINUR NEGATIVE 06/07/2016 1223   BILIRUBINUR neg 02/08/2014 1955   KETONESUR NEGATIVE 06/07/2016 1223   PROTEINUR 30 (A) 06/07/2016 1223   UROBILINOGEN 0.2 02/08/2014 1955   UROBILINOGEN 2.0 (H) 02/06/2009 1620   NITRITE NEGATIVE 06/07/2016 1223   LEUKOCYTESUR TRACE (A) 06/07/2016 1223   Sepsis Labs: @LABRCNTIP (procalcitonin:4,lacticidven:4)  ) Recent Results (from the past 240 hour(s))  C difficile quick scan w PCR reflex     Status: None   Collection Time: 06/11/16 10:52 AM  Result Value Ref Range Status   C Diff antigen NEGATIVE NEGATIVE Final   C Diff toxin NEGATIVE NEGATIVE Final   C Diff interpretation No C. difficile detected.  Final     Radiology Studies: No results found.    Scheduled Meds: . arformoterol  15 mcg Nebulization BID  . budesonide (PULMICORT) nebulizer solution  0.5 mg Nebulization BID  . famotidine (PEPCID) IV  20 mg Intravenous Q24H  . feeding supplement (GLUCERNA SHAKE)  237 mL Oral BID BM  . furosemide  80 mg Intravenous Q12H  . insulin aspart  0-9 Units Subcutaneous Q4H  . levothyroxine  37 mcg Intravenous Daily  . mouth rinse  15 mL Mouth Rinse BID  . metoprolol  5 mg Intravenous Q6H   Continuous Infusions: . amiodarone 30 mg/hr (06/19/16 1114)  . bivalirudin (ANGIOMAX) infusion 0.5 mg/mL (Non-ACS  indications) 0.05 mg/kg/hr (06/19/16 1113)  . dextrose 75 mL/hr at 06/18/16 2322     LOS: 15 days    Time spent: 25 minutes    Barton Dubois, MD [facility] Pager [phone]   If 7PM-7AM, please contact night-coverage [email or web address] Password [email or web address] 06/19/2016, 5:04 PM     `

## 2016-06-19 NOTE — Progress Notes (Signed)
[place] for Bivalirudin  Indication: atrial fibrillation   Allergies  Allergen Reactions  . [place] Venom Anaphylaxis  . Pork-Derived Products Anaphylaxis  . Chlorhexidine Itching    Reports that it is the dye in it  . Enoxaparin Sodium     REACTION: thrombocytopenia  . Heparin     REACTION: thrombocytopenia  . Lactose Intolerance (Gi)   . Other     Sensitive to dye in Betadine & Chlorohexadine   . Povidone-Iodine     Sensitivity- but if its wiped off she is able to tolerate betadine   . Indomethacin Rash  . Phenylpropanolamine Rash    Other Reaction: other reaction  . Povidone Iodine Rash    Patient Measurements: Height: 5' (152.4 cm) Weight: 173 lb 3.2 oz (78.6 kg) IBW/kg (Calculated) : 45.5  Vital Signs: Temp: 97.9 F (36.6 C) (08/29 0427) Temp Source: Oral (08/29 0427) BP: 148/62 (08/29 0427) Pulse Rate: 76 (08/29 0427)  Assessment: Patient is a 70 yo female who presented 8/14 with sepsis of presumed biliary origin. On  Angiomax for hx afib (Xarelto PTA) Pt with antiphospholipid syndrome which can prolong the aPTT at baseline but baseline is unknown. Chart progress notes mention hx HIT but allergies mention thrombocytopenia (no HIT testing noted in our records) and anaphylaxis to pork products). aPTT  therapeutic at 74 (goal 7-85) Talked with RN this am, will replace bag with new one since bag only gets 24 hrs and patient not using full bag in a 24 hr period. Note: pt was on warfarin in past but was stopped d/t trouble regulating INRs per 2/28 cards note. Hgb low but stable at 8.6, plts wnl.   Goal of Therapy:  Goal aPTT: 50-85 seconds Monitor platelets by anticoagulation protocol: Yes   Plan:  Continue Bivalirudin at 0.05 mg/kg/hr (3.78mg /hr) Monitor aPTT daily, CBC, s/s of bleed F/U ability to take PO to transition back to [place], PharmD, [place] Pharmacist Pager [phone] 06/19/2016 10:21  AM

## 2016-06-19 NOTE — Progress Notes (Addendum)
Nutrition Follow-up  DOCUMENTATION CODES:   Obesity unspecified  INTERVENTION:    Glucerna Shake po BID, each supplement provides 220 kcal and 10 grams of protein  NUTRITION DIAGNOSIS:   Inadequate oral intake now related to dysphagia as evidenced by meal completion < 50%, ongoing  GOAL:   Patient will meet greater than or equal to 90% of their needs, progressing  MONITOR:   PO intake, Supplement acceptance, Labs, Weight trends, Skin  ASSESSMENT:   87 yof who has multiple medical problems including apl syndrome on xarelto and cad presented to er with sudden chills and what edp described as epigastric pain.  She became less responsive in er and eventually was intubated. She underwent ruq [place] that shows really nl gb without stones and small amount fluid and ct that shows distended gb with some fluid.  She also appears to have right hepatic hemangioma.  Pt extubated 8/23. TF (Vital High Protein formula) via OGT discontinued. S/p MBSS 8/26 >> SLP recommending Dys 2, thin liquid diet. PO intake variable at 25-50% per flowsheet records. Pt feeling nauseous, however, no vomiting. Would benefit from oral nutrition supplements >> will order. PT recommending SNF placement.  Diet Order:  DIET DYS 2 Room service appropriate? Yes; Fluid consistency: Thin  Skin:  Wound (see comment) (MSAD abdomen, groin)  Last BM:  06/12/2016  Height:   Ht Readings from Last 1 Encounters:  06/04/16 5' (1.524 m)    Weight:   Wt Readings from Last 1 Encounters:  06/19/16 173 lb 3.2 oz (78.6 kg)    Ideal Body Weight:  45.5 kg  BMI:  Body mass index is 33.83 kg/m.  Estimated Nutritional Needs:   Kcal:  1500-1700  Protein:  70-80 gm  Fluid:  1.5-1.7 L  EDUCATION NEEDS:   No education needs identified at this time  Arthur Holms, RD, LDN Pager #: [phone] After-Hours Pager #: [phone]

## 2016-06-19 NOTE — Progress Notes (Signed)
Follow up with  CCS 2 weeks after discharge   Consult IR   For orders about drain and drain care at discharge

## 2016-06-20 DIAGNOSIS — I480 Paroxysmal atrial fibrillation: Secondary | ICD-10-CM

## 2016-06-20 LAB — CBC WITH DIFFERENTIAL/PLATELET
Basophils Absolute: 0 10*3/uL (ref 0.0–0.1)
Basophils Relative: 0 %
EOS PCT: 2 %
Eosinophils Absolute: 0.2 10*3/uL (ref 0.0–0.7)
HEMATOCRIT: 26.7 % — AB (ref 36.0–46.0)
Hemoglobin: 8.7 g/dL — ABNORMAL LOW (ref 12.0–15.0)
Lymphocytes Relative: 19 %
Lymphs Abs: 1.7 10*3/uL (ref 0.7–4.0)
MCH: 27.4 pg (ref 26.0–34.0)
MCHC: 32.6 g/dL (ref 30.0–36.0)
MCV: 84.2 fL (ref 78.0–100.0)
MONO ABS: 1.3 10*3/uL — AB (ref 0.1–1.0)
MONOS PCT: 15 %
NEUTROS ABS: 5.5 10*3/uL (ref 1.7–7.7)
Neutrophils Relative %: 64 %
Platelets: 267 10*3/uL (ref 150–400)
RBC: 3.17 MIL/uL — ABNORMAL LOW (ref 3.87–5.11)
RDW: 14.6 % (ref 11.5–15.5)
WBC: 8.7 10*3/uL (ref 4.0–10.5)

## 2016-06-20 LAB — COMPREHENSIVE METABOLIC PANEL
ALBUMIN: 2.7 g/dL — AB (ref 3.5–5.0)
ALT: 15 U/L (ref 14–54)
ANION GAP: 11 (ref 5–15)
AST: 16 U/L (ref 15–41)
Alkaline Phosphatase: 88 U/L (ref 38–126)
BILIRUBIN TOTAL: 0.7 mg/dL (ref 0.3–1.2)
BUN: 48 mg/dL — AB (ref 6–20)
CALCIUM: 8.9 mg/dL (ref 8.9–10.3)
CO2: 35 mmol/L — AB (ref 22–32)
Chloride: 91 mmol/L — ABNORMAL LOW (ref 101–111)
Creatinine, Ser: 2.89 mg/dL — ABNORMAL HIGH (ref 0.44–1.00)
GFR calc Af Amer: 18 mL/min — ABNORMAL LOW (ref >60)
GFR calc non Af Amer: 16 mL/min — ABNORMAL LOW (ref >60)
GLUCOSE: 104 mg/dL — AB (ref 65–99)
Potassium: 3.2 mmol/L — ABNORMAL LOW (ref 3.5–5.1)
SODIUM: 137 mmol/L (ref 135–145)
Total Protein: 5.9 g/dL — ABNORMAL LOW (ref 6.5–8.1)

## 2016-06-20 LAB — GLUCOSE, CAPILLARY
GLUCOSE-CAPILLARY: 113 mg/dL — AB (ref 65–99)
GLUCOSE-CAPILLARY: 138 mg/dL — AB (ref 65–99)
GLUCOSE-CAPILLARY: 110 mg/dL — AB (ref 65–99)
Glucose-Capillary: 122 mg/dL — ABNORMAL HIGH (ref 65–99)
Glucose-Capillary: 112 mg/dL — ABNORMAL HIGH (ref 65–99)
Glucose-Capillary: 104 mg/dL — ABNORMAL HIGH (ref 65–99)

## 2016-06-20 LAB — MAGNESIUM: Magnesium: 1.8 mg/dL (ref 1.7–2.4)

## 2016-06-20 LAB — APTT: aPTT: 78 seconds — ABNORMAL HIGH (ref 24–36)

## 2016-06-20 MED ORDER — LEVOTHYROXINE SODIUM 75 MCG PO TABS
75.0000 ug | ORAL_TABLET | Freq: Every day | ORAL | Status: DC
  Administered 2016-06-21: 75 ug via ORAL
  Filled 2016-06-20: qty 1

## 2016-06-20 MED ORDER — AMIODARONE HCL 200 MG PO TABS
200.0000 mg | ORAL_TABLET | Freq: Two times a day (BID) | ORAL | Status: DC
  Administered 2016-06-20 – 2016-06-21 (×3): 200 mg via ORAL
  Filled 2016-06-20 (×3): qty 1

## 2016-06-20 MED ORDER — TRAZODONE HCL 50 MG PO TABS
25.0000 mg | ORAL_TABLET | Freq: Every day | ORAL | Status: DC
  Administered 2016-06-20: 25 mg via ORAL
  Filled 2016-06-20: qty 1

## 2016-06-20 MED ORDER — FAMOTIDINE 20 MG PO TABS
20.0000 mg | ORAL_TABLET | Freq: Every day | ORAL | Status: DC
  Administered 2016-06-21: 20 mg via ORAL
  Filled 2016-06-20: qty 1

## 2016-06-20 MED ORDER — METOPROLOL TARTRATE 50 MG PO TABS
50.0000 mg | ORAL_TABLET | Freq: Two times a day (BID) | ORAL | Status: DC
  Administered 2016-06-20 – 2016-06-21 (×2): 50 mg via ORAL
  Filled 2016-06-20 (×2): qty 1

## 2016-06-20 MED ORDER — RIVAROXABAN 15 MG PO TABS
15.0000 mg | ORAL_TABLET | Freq: Every day | ORAL | Status: DC
  Administered 2016-06-20: 15 mg via ORAL
  Filled 2016-06-20: qty 1

## 2016-06-20 MED ORDER — METOPROLOL TARTRATE 5 MG/5ML IV SOLN
5.0000 mg | Freq: Four times a day (QID) | INTRAVENOUS | Status: DC | PRN
  Filled 2016-06-20: qty 5

## 2016-06-20 MED ORDER — SODIUM CHLORIDE 0.9 % IV SOLN
INTRAVENOUS | Status: DC
Start: 2016-06-20 — End: 2016-06-21
  Administered 2016-06-20: 13:00:00 via INTRAVENOUS

## 2016-06-20 MED ORDER — METOPROLOL TARTRATE 50 MG PO TABS: 50.0000 mg | ORAL_TABLET | Freq: Two times a day (BID) | ORAL | Status: DC

## 2016-06-20 NOTE — Care Management Note (Signed)
Case Management Note Marvetta Gibbons RN, BSN Unit 2W-Case Manager [phone]  Patient Details  Name: MILENIA BANTER MRN: [id number] Date of Birth: 1946/02/20  Subjective/Objective: Admitted with severe sepsis, s/p gallbladder drain                   Action/Plan: PTA pt lived at home with husband per PT recommendations - plan for SNF at discharge- CSW following for placement needs  Expected Discharge Date:                  Expected Discharge Plan:  Skilled Nursing Facility  In-House Referral:  Clinical Social Work  Discharge planning Services  CM Consult  Post Acute Care Choice:    Choice offered to:     DME Arranged:    DME Agency:     HH Arranged:    [place] Agency:     Status of Service:  In process, will continue to follow  If discussed at Long Length of Stay Meetings, dates discussed:  8/29. 8/31  Additional Comments:    Dawayne Patricia, RN 06/20/2016, 2:51 PM

## 2016-06-20 NOTE — Progress Notes (Signed)
Speech Language Pathology Treatment: Dysphagia  Patient Details Name: Latoya [name] MRN: [id number] DOB: Feb 03, 1946 Today's Date: 06/20/2016 Time: [id number] SLP Time Calculation (min) (ACUTE ONLY): 11 min  Assessment / Plan / Recommendation Clinical Impression  Skilled treatment session focused on addressing dysphagia goals. SLP facilitated session by providing set-up of Dys.2 textures and thin liquids via cup as well as Min verbal cues to monitor portion control.  Patient Mod I for pacing and use of other safe swallow strategies with overt cough in 2/10 trials, which was suspected to be related to size of boluses.  Patient appeared overall brighter today, willing to participate, and reported that she surprised herself by doing better today.  Recommend to continue with current plan of care of now. SLP to continue to follow to ensure that motivation and tolerance continues prior to advancement.       HPI HPI: 70 y/o woman with respiratory failure, severe sepsis of presumed biliary origin and AKI. She required intubation 8/14-8/18, and again 8/18-8/23. PMH: PE, PAF, HTN, HLD, HH, GERD, CAD, asthma, spondylosis requiring cervical fusion C6-C7, and cleft lip/partial palate s/p surgical intervention as a child      SLP Plan  Continue with current plan of care     Recommendations  Diet recommendations: Dysphagia 2 (fine chop);Thin liquid Liquids provided via: Cup;No straw Medication Administration: Crushed with puree Supervision: Patient able to self feed;Intermittent supervision to cue for compensatory strategies Compensations: Slow rate;Small sips/bites;Follow solids with liquid;Clear throat intermittently Postural Changes and/or Swallow Maneuvers: Seated upright 90 degrees             Oral Care Recommendations: Oral care BID Follow up Recommendations: Skilled Nursing facility Plan: Continue with current plan of care     GO               Carmelia Roller.,  CCC-SLP [id number]  [place] 06/20/2016, 4:33 PM

## 2016-06-20 NOTE — Progress Notes (Signed)
PROGRESS NOTE    Latoya [name]  [id number] DOB: 01/13/1946 DOA: 06/04/2016 PCP: Thressa Sheller, MD   Brief Narrative:  70 y/o WF PMHx Antiphospholipid Syndrome on AC (hx of unprovoked DVT/PE), as well as CAD native artery, HTN,  PAF, HLD, Erosive gastritis , Asthma,Spondylosis, lumbosacral. Who presented to the ED with sudden onset of chills and abdominal pain. In the ED her clinical status declined, becoming less responsive, more tachycardic and more tachypneic. Imaging studies suggested an enlarged gallbladder.  Subjective: Remains afebrile, no CP or significant SOB. Essentially euvolemic currently. Still with very poor PO intake and extremely weak. Reports some nausea.  Assessment & Plan:   Principal Problem:   Ascending cholangitis Active Problems:   PAROXYSMAL ATRIAL FIBRILLATION   Sepsis ([place])   Hemangioma of liver   Severe sepsis (HCC)   Septic shock (HCC)   Acute respiratory failure with hypoxia (HCC)   Enterococcal bacteremia   Bacteremia due to Klebsiella pneumoniae   Bacteremia due to Escherichia coli   Acute respiratory failure (HCC)   Cholangitis   Gastroparesis   Severe sepsis, probable biliary origin -s/p cholecystostomy  -has completed antibiotics therapy -will follow general surgery rec's  Escherichia coli and enterococcal bacteremia -Causing severe sepsis, source is likely mullerian origin. -Completed total 2 weeks of antibiotics.  Moderate persistent asthma -Continue PRN nebulizer -no wheezing currently -breathing stable  Chronic systolic and diastolic CHF -Lasix 40mg  PO BID started on 8/29; no crackles and no significant edema -continue Hydralazine PRN -continue Metoprolol 5 mg IV QID; stil with problems swallowing  -Strict in and out -Daily weight  CAD native artery  -no CP -continue monitoring on telemetry   HTN -well Controlled, will monitor   Paroxysmal Atrial Fibrillation -Currently has remained in normal sinus  rhythm -Continue Angiomax per pharmacy -Continue amiodarone drip: Consider transitioning to PO when patient's PO status is acceptable and no further nausea/vomiting.  Acute renal failure -Baseline creatinine is normal at 0.68 on 06/04/16, this reach peak of 4.1 on 02/11/16 -Multifactorial secondary to sepsis, bacteremia and hypertension. -This is improving, continue to monitor  Antiphospholipid Ab Syndrome -On Angiomax, was on Xarelto at home will restart oral anticoagulation. -patient allergic to heparin products   Hypothyroidism -continue Synthroid 37 g daily  Hypokalemia  -Potassium goal> 4 -will replete as needed  -secondary to cholecystostomy drainage and diuresis   Hypomagnesemia  -Magnesium goal> 2 -will continue repletion as needed -most likely secondary to Poor PO intake and also active cholecystostomy drainage   Dysphagia -will follow SPL diet consistency recommendations -currently on dysphagia 2 with thin liquids. -poor appetite, hopefully can take oral medications.   Chronic pain and multiple cervical surgeries  -will continue use of oxycodone and slowly resume home pain regimen as needed   DVT prophylaxis: Angiomax Code Status: DO NOT RESUSCITATE Family Communication: husband at bedside  Disposition Plan: PT recommending SNF when medically stable for rehabilitation. Hopefully in the next 48-72 hours.   Consultants:  PCCM Dr. Edrick Oh, nephrology Dr. Erroll Luna CCS  Procedures/Significant Events:  8/14 ultrasound abdomen RUQ:-. 7.9 x 7.8 x 8.1 cm complex hepatic mass, 8/14 CT angiogram abdomen and pelvis:-2. Diffuse gallbladder distention, and dilatation of the common bile duct to 1.3 cm in diameter,concerning for distal obstruction. - Intrahepatic biliary ductal dilatation- 7.1 cm right hepatic mass, with prominent peripheral vasculature, likely reflects a large benign hemangioma. This has increased from 4.3 cm in 2007. - Chronic rupture of the  patient's right-sided breast implant, stable from 2007, with leakage  into the right lateral breast, 8/14 [place] Percutaneous transhepatic cholecystostomy. 8/20 echocardiogram:- LVEF =  50%. -(grade 2 diastolic dysfunction). - Mitral valve: moderate regurgitation -- Left atrium: moderately dilated.   Cultures Blood 8/14 >> positive E. Coli and enterococcous and klebsiella. Urine 8/14 >>NTD. Abscess 8/14>>> positive E.Coli and enterococcus (both pan sens)    Antimicrobials: Unasyn 8/17>> 8/21 Vanc 8/14 >> 8/16 Zosyn 8/14 >>8/17   LINES / TUBES:  ETT 7.5 mm 8/14>>>8/18, 8/18>>> 8/24 Foley 8/14>>> R IJ TLC 8/14>>> Gall bladder drain (IR) 8/14>>>   Continuous Infusions: . amiodarone 30 mg/hr (06/20/16 0943)  . bivalirudin (ANGIOMAX) infusion 0.5 mg/mL (Non-ACS indications) 0.05 mg/kg/hr (06/19/16 1113)  . dextrose 75 mL/hr at 06/20/16 0231    Objective: Vitals:   06/20/16 0509 06/20/16 0524 06/20/16 0847 06/20/16 0850  BP: (!) 155/58     Pulse: 68     Resp: 17     Temp: 97.5 F (36.4 C)     TempSrc: Oral     SpO2: 100%  97% 97%  Weight:  75.6 kg (166 lb 11.2 oz)    Height:        Intake/Output Summary (Last 24 hours) at 06/20/16 1222 Last data filed at 06/20/16 1158  Gross per 24 hour  Intake               10 ml  Output             1495 ml  Net            -1485 ml   Filed Weights   06/18/16 0441 06/19/16 0427 06/20/16 0524  Weight: 85 kg (187 lb 8 oz) 78.6 kg (173 lb 3.2 oz) 75.6 kg (166 lb 11.2 oz)    Examination:  General: Patient oriented X3, in no distress, moving 4 limbs, no having CP or SOB. Still with very poor appetite and intermittent episodes of nausea. Eyes: negative scleral hemorrhage, negative anisocoria, negative icterus ENT: no thrush, no erythema or exudates Lungs: Clear to auscultation bilaterally Cardiovascular: Regular rate and rhythm without murmur gallop or rub normal S1 and S2 Abdomen: negative abdominal pain, nondistended, positive soft,  bowel sounds, no rebound, no ascites, percutaneous cholecystostomy drain in place draining watery brown fluid, negative sign of blood/pus (approx 120-150 CC) Extremities: No significant cyanosis, clubbing, or edema on lower extremities Skin: cholecystostomy tube in place, Negative rashes and no pressure ulcers appreciated. Psychiatric:  Negative depression, negative anxiety, negative fatigue, negative mania Central nervous system:  Cranial nerves II through XII intact, tongue/uvula midline, patient with limited cervical range of motion secondary to multiple C-spine surgeries, in all extremities muscle strength 4/5, sensation intact throughout, negative dysarthria.  Data Reviewed: Care during the described time interval was provided by me .  I have reviewed this patient's available data, including medical history, events of note, physical examination, and all test results as part of my evaluation. I have personally reviewed and interpreted all radiology studies.  CBC:  Recent Labs Lab 06/15/16 0340 06/16/16 0445 06/18/16 0516 06/19/16 0420 06/20/16 0436  WBC 15.7* 12.9* 11.8* 10.3 8.7  NEUTROABS  --   --  9.1* 7.6 5.5  HGB 9.4* 8.7* 8.5* 8.6* 8.7*  HCT 29.5* 27.6* 26.7* 26.2* 26.7*  MCV 86.8 86.5 85.6 84.5 84.2  PLT 201 190 249 263 [id number]   Basic Metabolic Panel:  Recent Labs Lab 06/15/16 0340 06/16/16 0444 06/17/16 0500 06/17/16 0834 06/17/16 1535 06/18/16 0516 06/19/16 0420 06/20/16 0436  NA 152*  151* 149*  143  --   --  136 138 137  K 3.0*  3.0* 2.4* 2.5*  --  3.9 3.1* 3.0* 3.2*  CL 104  105 101 96*  --   --  91* 91* 91*  CO2 29  29 35* 37*  --   --  33* 36* 35*  GLUCOSE 114*  116* 147* 122*  --   --  130* 120* 104*  BUN 111*  111* 93* 76*  --   --  65* 56* 48*  CREATININE 3.63*  3.69* 3.24* 2.81*  --   --  2.74* 2.69* 2.89*  CALCIUM 8.7*  8.7* 9.1 8.6*  --   --  8.6* 8.7* 8.9  MG 2.2  --   --  1.6* 1.8 1.9 1.8 1.8  PHOS 7.1*  7.1* 5.3* 5.3*  --   --  4.7* 5.4*   --    GFR: Estimated Creatinine Clearance: 16.7 mL/min (by C-G formula based on SCr of 2.89 mg/dL).   Liver Function Tests:  Recent Labs Lab 06/16/16 0444 06/17/16 0500 06/18/16 0516 06/19/16 0420 06/20/16 0436  AST  --   --  14* 14* 16  ALT  --   --  16 14 15   ALKPHOS  --   --  83 85 88  BILITOT  --   --  1.1 0.6 0.7  PROT  --   --  6.2* 5.9* 5.9*  ALBUMIN 2.4* 2.3* 2.5* 2.5* 2.7*   CBG:  Recent Labs Lab 06/19/16 2022 06/20/16 0130 06/20/16 0506 06/20/16 0748 06/20/16 1122  GLUCAP 165* 122* 112* 113* 138*    Urine analysis:    Component Value Date/Time   COLORURINE YELLOW 06/07/2016 1223   APPEARANCEUR CLOUDY (A) 06/07/2016 1223   LABSPEC >1.030 (H) 06/07/2016 1223   PHURINE 5.5 06/07/2016 1223   GLUCOSEU NEGATIVE 06/07/2016 1223   HGBUR LARGE (A) 06/07/2016 1223   BILIRUBINUR NEGATIVE 06/07/2016 1223   BILIRUBINUR neg 02/08/2014 1955   KETONESUR NEGATIVE 06/07/2016 1223   PROTEINUR 30 (A) 06/07/2016 1223   UROBILINOGEN 0.2 02/08/2014 1955   UROBILINOGEN 2.0 (H) 02/06/2009 1620   NITRITE NEGATIVE 06/07/2016 1223   LEUKOCYTESUR TRACE (A) 06/07/2016 1223   Sepsis Labs: @LABRCNTIP (procalcitonin:4,lacticidven:4)  ) Recent Results (from the past 240 hour(s))  C difficile quick scan w PCR reflex     Status: None   Collection Time: 06/11/16 10:52 AM  Result Value Ref Range Status   C Diff antigen NEGATIVE NEGATIVE Final   C Diff toxin NEGATIVE NEGATIVE Final   C Diff interpretation No C. difficile detected.  Final     Radiology Studies: No results found.    Scheduled Meds: . arformoterol  15 mcg Nebulization BID  . budesonide (PULMICORT) nebulizer solution  0.5 mg Nebulization BID  . famotidine (PEPCID) IV  20 mg Intravenous Q24H  . feeding supplement (GLUCERNA SHAKE)  237 mL Oral BID BM  . furosemide  40 mg Oral BID  . insulin aspart  0-9 Units Subcutaneous Q4H  . levothyroxine  37 mcg Intravenous Daily  . mouth rinse  15 mL Mouth Rinse BID  .  metoprolol  5 mg Intravenous Q6H  . potassium chloride  40 mEq Oral BID   Continuous Infusions: . amiodarone 30 mg/hr (06/20/16 0943)  . bivalirudin (ANGIOMAX) infusion 0.5 mg/mL (Non-ACS indications) 0.05 mg/kg/hr (06/19/16 1113)  . dextrose 75 mL/hr at 06/20/16 0231     LOS: 16 days    Time spent: 25 minutes    Deondria Puryear  A, MD [facility] Pager [phone]  If 7PM-7AM, please contact night-coverage [email or web address] Password [email or web address] 06/20/2016, 12:22 PM     `

## 2016-06-20 NOTE — Progress Notes (Signed)
Physical Therapy Treatment Patient Details Name: CHASTINE ANIELLO MRN: [id number] DOB: 07-16-1946 Today's Date: 06/20/2016    History of Present Illness 70 yo female with onset of septic shock and pleural effusion, has hemangioma of liver and SOB, acute respiratory failure, pulmonary edema and lactic acidosis.  PMHx:  CAD, a-fib with RVR, CHF    PT Comments    Patient slowly progressing.  Today able to achieve OOB.  Very limited by weakness and limited activity tolerance.  Will continue to benefit from skilled PT in the acute setting to allow d/c home following SNF level rehab stay.   Follow Up Recommendations  SNF     Equipment Recommendations  None recommended by PT    Recommendations for Other Services       Precautions / Restrictions Precautions Precautions: Fall    Mobility  Bed Mobility Overal bed mobility: Needs Assistance Bed Mobility: Supine to Sit;Sidelying to Sit   Sidelying to sit: Mod assist       General bed mobility comments: already on L side, assist and increased time cues for rail use to come up to sit  Transfers Overall transfer level: Needs assistance   Transfers: Sit to/from Stand;Stand Pivot Transfers Sit to Stand: +2 physical assistance;Mod assist Stand pivot transfers: +2 physical assistance;Max assist       General transfer comment: lifting assist from bed, assist with pt with flexed posture to take pivotal steps to chair  Ambulation/Gait                 Stairs            Wheelchair Mobility    Modified Rankin (Stroke Patients Only)       Balance                                    Cognition Arousal/Alertness: Awake/alert Behavior During Therapy: WFL for tasks assessed/performed Overall Cognitive Status: Within Functional Limits for tasks assessed                      Exercises General Exercises - Upper Extremity Shoulder Flexion: AAROM;Both;5 reps;Seated Elbow Flexion: AAROM;Both;5  reps;Seated Elbow Extension: AAROM;5 reps;Both;Seated General Exercises - Lower Extremity Long Arc Quad: AROM;Both;5 reps;Seated Toe Raises: AROM;Both;5 reps;Seated Heel Raises: AROM;Both;5 reps;Seated    General Comments        Pertinent Vitals/Pain Faces Pain Scale: Hurts little more Pain Location: joints and back Pain Descriptors / Indicators: Aching;Tightness Pain Intervention(s): Monitored during session;Repositioned    Home Living                      Prior Function            PT Goals (current goals can now be found in the care plan section) Progress towards PT goals: Progressing toward goals    Frequency  Min 2X/week    PT Plan Current plan remains appropriate    Co-evaluation             End of Session Equipment Utilized During Treatment: Gait belt Activity Tolerance: Patient limited by fatigue Patient left: in chair;with call bell/phone within reach;with chair alarm set;with nursing/sitter in room     Time: [id number] PT Time Calculation (min) (ACUTE ONLY): 20 min  Charges:  $Therapeutic Activity: 8-22 mins  G CodesReginia Naas 06/20/2016, 2:59 PM  Magda Kiel, [place] 06/20/2016

## 2016-06-20 NOTE — Clinical Social Work Note (Signed)
Clinical Social Work Assessment  Patient Details  Name: Latoya [name] MRN: [id number] Date of Birth: [birth year]  Date of referral:  06/20/16               Reason for consult:  Discharge Planning                Permission sought to share information with:  Facility Sport and exercise psychologist, Family Supports Permission granted to share information::  Yes, Verbal Permission Granted  Name::     [place]::  SNF  Relationship::  Husband  Contact Information:     Housing/Transportation Living arrangements for the past 2 months:  Single Family Home Source of Information:  Spouse Patient Interpreter Needed:  None Criminal Activity/Legal Involvement Pertinent to Current Situation/Hospitalization:  No - Comment as needed Significant Relationships:  Spouse Lives with:  Spouse Do you feel safe going back to the place where you live?  Yes Need for family participation in patient care:  Yes (Comment)  Care giving concerns:  Patient's husband reported patient is agreeable to short term rehab stay. He reported the patient has arthritis and it is important for the patient to keep moving.    Social Worker assessment / plan:  CSW met with patient at beside to complete assessment. Patient was sleeping comfortably in bed. Patient's husband was also at bedside. CSW explained PT recommendation for SNF placement. CSW explained SNF search and placement process to the  patient's husband and answered his questions. CSW will follow up with bed offers.  Employment status:  Retired Forensic scientist:  Medicare PT Recommendations:    Information / Referral to community resources:  [place]  Patient/Family's Response to care:  The patient's husband is happy with the care the patient has received.   Patient/Family's Understanding of and Emotional Response to Diagnosis, Current Treatment, and Prognosis:  The patient's husband has a good understanding of why the patient was admitted.  He understands the care plan and what the patient will need post discharge.  Emotional Assessment Appearance:  Appears stated age Attitude/Demeanor/Rapport:  Unable to Assess Affect (typically observed):  Unable to Assess Orientation:   (unable to assess.) Alcohol / Substance use:  Not Applicable Psych involvement (Current and /or in the community):  No (Comment)  Discharge Needs  Concerns to be addressed:  Discharge Planning Concerns Readmission within the last 30 days:  No Current discharge risk:  Physical Impairment Barriers to Discharge:  Continued Medical Work up   [employer], [place] 06/20/2016, 4:03 PM

## 2016-06-20 NOTE — Progress Notes (Addendum)
[place] for Bivalirudin / Xarelto Indication: atrial fibrillation   Allergies  Allergen Reactions  . Hornet Venom Anaphylaxis  . Pork-Derived Products Anaphylaxis  . Chlorhexidine Itching    Reports that it is the dye in it  . Enoxaparin Sodium     REACTION: thrombocytopenia  . Heparin     REACTION: thrombocytopenia  . Lactose Intolerance (Gi)   . Other     Sensitive to dye in Betadine & Chlorohexadine   . Povidone-Iodine     Sensitivity- but if its wiped off she is able to tolerate betadine   . Indomethacin Rash  . Phenylpropanolamine Rash    Other Reaction: other reaction  . Povidone Iodine Rash    Patient Measurements: Height: 5' (152.4 cm) Weight: 166 lb 11.2 oz (75.6 kg) IBW/kg (Calculated) : 45.5  Vital Signs: Temp: 97.5 F (36.4 C) (08/30 0509) Temp Source: Oral (08/30 0509) BP: 155/58 (08/30 0509) Pulse Rate: 68 (08/30 0509)  Assessment: Patient is a 70 yo female who presented 8/14 with sepsis of presumed biliary origin. On  Angiomax for hx afib (Xarelto PTA) Pt with antiphospholipid syndrome which can prolong the aPTT at baseline but baseline is unknown. Chart progress notes mention hx HIT but allergies mention thrombocytopenia (no HIT testing noted in our records) and anaphylaxis to pork products). aPTT  therapeutic at 78 (goal 26-85) Talked with RN this am, will replace bag with new one since bag only gets 24 hrs and patient not using full bag in a 24 hr period. Note: pt was on warfarin in past but was stopped d/t trouble regulating INRs per 2/28 cards note. Hgb low but stable at 8.7, plts wnl.   Goal of Therapy:  Goal aPTT: 50-85 seconds Monitor platelets by anticoagulation protocol: Yes   Plan:  Continue bivalirudin at 0.05 mg/kg/hr (3.78mg /hr) Monitor aPTT daily, CBC, s/s of bleed F/U ability to take PO to transition back to Xarelto  Consider transitioning back to Xarelto today since starting to take PO  again?  Elenor Quinones, PharmD, BCPS Clinical Pharmacist Pager [phone] 06/20/2016 8:03 AM   ADDENDUM:  Dr. Hartford Poli would like to stop Angiomax gtt and restart PTA Xarelto. SCr still elevated from AKI at 2.89, CrCl ~54ml/min.   Plan: Restart Xarelto at lower dose of 15mg  PO daily  Stop Angiomax gtt when first dose given Monitor CBC, s/s of bleed  Elenor Quinones, PharmD, [facility] Clinical Pharmacist Pager [phone] 06/20/2016 12:42 PM

## 2016-06-20 NOTE — Consult Note (Signed)
   [facility] CM Inpatient Consult   06/20/2016  Latoya [name] [facility] January 03, 1946 [id number]  Patient screened for potential [facility] Care Management services. Chart reviewed. Noted discharge plan is for  SNF. Spoke with inpatient RNCM to confirm.  There are no identifiable [facility] Care Management needs at this time. If patient's post hospital needs change, please place a [facility] Care Management consult.  For questions please contact:  Marthenia Rolling, MSN-Ed, RN,BSN [facility] Liaison [phone], [place], RN,BSN [facility] Liaison [phone]

## 2016-06-21 DIAGNOSIS — R1312 Dysphagia, oropharyngeal phase: Secondary | ICD-10-CM | POA: Diagnosis not present

## 2016-06-21 DIAGNOSIS — R1013 Epigastric pain: Secondary | ICD-10-CM | POA: Diagnosis not present

## 2016-06-21 DIAGNOSIS — R070 Pain in throat: Secondary | ICD-10-CM | POA: Diagnosis not present

## 2016-06-21 DIAGNOSIS — D6489 Other specified anemias: Secondary | ICD-10-CM | POA: Diagnosis not present

## 2016-06-21 DIAGNOSIS — R1084 Generalized abdominal pain: Secondary | ICD-10-CM | POA: Diagnosis not present

## 2016-06-21 DIAGNOSIS — M7989 Other specified soft tissue disorders: Secondary | ICD-10-CM | POA: Diagnosis not present

## 2016-06-21 DIAGNOSIS — A419 Sepsis, unspecified organism: Secondary | ICD-10-CM | POA: Diagnosis not present

## 2016-06-21 DIAGNOSIS — R279 Unspecified lack of coordination: Secondary | ICD-10-CM | POA: Diagnosis not present

## 2016-06-21 DIAGNOSIS — R1310 Dysphagia, unspecified: Secondary | ICD-10-CM | POA: Diagnosis not present

## 2016-06-21 DIAGNOSIS — K810 Acute cholecystitis: Secondary | ICD-10-CM | POA: Diagnosis not present

## 2016-06-21 DIAGNOSIS — R110 Nausea: Secondary | ICD-10-CM | POA: Diagnosis not present

## 2016-06-21 DIAGNOSIS — I480 Paroxysmal atrial fibrillation: Secondary | ICD-10-CM | POA: Diagnosis not present

## 2016-06-21 DIAGNOSIS — M545 Low back pain: Secondary | ICD-10-CM | POA: Diagnosis not present

## 2016-06-21 DIAGNOSIS — Z48815 Encounter for surgical aftercare following surgery on the digestive system: Secondary | ICD-10-CM | POA: Diagnosis not present

## 2016-06-21 DIAGNOSIS — M6281 Muscle weakness (generalized): Secondary | ICD-10-CM | POA: Diagnosis not present

## 2016-06-21 DIAGNOSIS — K830 Cholangitis: Secondary | ICD-10-CM | POA: Diagnosis not present

## 2016-06-21 DIAGNOSIS — R2681 Unsteadiness on feet: Secondary | ICD-10-CM | POA: Diagnosis not present

## 2016-06-21 DIAGNOSIS — E038 Other specified hypothyroidism: Secondary | ICD-10-CM | POA: Diagnosis not present

## 2016-06-21 DIAGNOSIS — J9601 Acute respiratory failure with hypoxia: Secondary | ICD-10-CM | POA: Diagnosis not present

## 2016-06-21 DIAGNOSIS — J4540 Moderate persistent asthma, uncomplicated: Secondary | ICD-10-CM | POA: Diagnosis not present

## 2016-06-21 DIAGNOSIS — R7881 Bacteremia: Secondary | ICD-10-CM | POA: Diagnosis not present

## 2016-06-21 DIAGNOSIS — K819 Cholecystitis, unspecified: Secondary | ICD-10-CM | POA: Diagnosis not present

## 2016-06-21 LAB — CBC
HCT: 26.9 % — ABNORMAL LOW (ref 36.0–46.0)
Hemoglobin: 8.9 g/dL — ABNORMAL LOW (ref 12.0–15.0)
MCH: 28.2 pg (ref 26.0–34.0)
MCHC: 33.1 g/dL (ref 30.0–36.0)
MCV: 85.1 fL (ref 78.0–100.0)
PLATELETS: 264 10*3/uL (ref 150–400)
RBC: 3.16 MIL/uL — AB (ref 3.87–5.11)
RDW: 14.7 % (ref 11.5–15.5)
WBC: 8 10*3/uL (ref 4.0–10.5)

## 2016-06-21 LAB — COMPREHENSIVE METABOLIC PANEL
ALK PHOS: 95 U/L (ref 38–126)
ALT: 18 U/L (ref 14–54)
AST: 18 U/L (ref 15–41)
Albumin: 2.9 g/dL — ABNORMAL LOW (ref 3.5–5.0)
Anion gap: 11 (ref 5–15)
BUN: 41 mg/dL — AB (ref 6–20)
CALCIUM: 9 mg/dL (ref 8.9–10.3)
CHLORIDE: 99 mmol/L — AB (ref 101–111)
CO2: 31 mmol/L (ref 22–32)
CREATININE: 2.86 mg/dL — AB (ref 0.44–1.00)
GFR, EST AFRICAN AMERICAN: 18 mL/min — AB (ref >60)
GFR, EST NON AFRICAN AMERICAN: 16 mL/min — AB (ref >60)
Glucose, Bld: 89 mg/dL (ref 65–99)
Potassium: 3.2 mmol/L — ABNORMAL LOW (ref 3.5–5.1)
SODIUM: 141 mmol/L (ref 135–145)
Total Bilirubin: 0.9 mg/dL (ref 0.3–1.2)
Total Protein: 6.2 g/dL — ABNORMAL LOW (ref 6.5–8.1)

## 2016-06-21 LAB — GLUCOSE, CAPILLARY
GLUCOSE-CAPILLARY: 81 mg/dL (ref 65–99)
GLUCOSE-CAPILLARY: 115 mg/dL — AB (ref 65–99)
Glucose-Capillary: 88 mg/dL (ref 65–99)
Glucose-Capillary: 86 mg/dL (ref 65–99)

## 2016-06-21 LAB — MAGNESIUM: Magnesium: 1.8 mg/dL (ref 1.7–2.4)

## 2016-06-21 MED ORDER — POTASSIUM CHLORIDE CRYS ER 20 MEQ PO TBCR: 60.0000 meq | EXTENDED_RELEASE_TABLET | Freq: Once | ORAL | Status: DC

## 2016-06-21 MED ORDER — RIVAROXABAN 15 MG PO TABS: 15.0000 mg | ORAL_TABLET | Freq: Every day | ORAL | Status: DC

## 2016-06-21 MED ORDER — FENTANYL 37.5 MCG/HR TD PT72: 1.0000 | MEDICATED_PATCH | TRANSDERMAL | 0 refills | Status: DC

## 2016-06-21 MED ORDER — AMIODARONE HCL 200 MG PO TABS: 200.0000 mg | ORAL_TABLET | Freq: Every day | ORAL | Status: DC

## 2016-06-21 MED ORDER — HYDROMORPHONE HCL 4 MG PO TABS: 4.0000 mg | ORAL_TABLET | ORAL | 0 refills | Status: DC | PRN

## 2016-06-21 NOTE — [place] FL2 (Signed)
[place] MEDICAID FL2 LEVEL OF CARE SCREENING TOOL     IDENTIFICATION  Patient Name: Latoya [name] [facility] Birthdate: Jun 22, 1946 Sex: female Admission Date (Current Location): 06/04/2016  [facility] and [place] Number:  Herbalist and Address:  [facility] [place] [address], [place], [place] [address]      Provider Number: [id number]  Attending Physician Name and Address:  Verlee Monte, MD  Relative Name and Phone Number:       Current Level of Care: Hospital Recommended Level of Care: [place] Prior Approval Number:    Date Approved/Denied:   PASRR Number: [id number]  Discharge Plan: SNF    Current Diagnoses: Patient Active Problem List   Diagnosis Date Noted  . Cholangitis   . Gastroparesis   . Acute respiratory failure ([place])   . Enterococcal bacteremia 06/06/2016  . Bacteremia due to Klebsiella pneumoniae 06/06/2016  . Bacteremia due to Escherichia coli 06/06/2016  . Septic shock ([place])   . Acute respiratory failure with hypoxia ([place])   . Ascending cholangitis 06/04/2016  . Sepsis ([place]) 06/04/2016  . Hemangioma of liver 06/04/2016  . Severe sepsis ([place]) 06/04/2016  . Cervical pseudoarthrosis ([place]) 08/31/2014  . Anaphylaxis, mild, due to wasp envenomation 04/12/2013  . Chest pain, atypical 04/11/2013  . PAD (peripheral artery disease) ([place]) 06/20/2011  . ANEMIA, IRON DEFICIENCY, MICROCYTIC 06/01/2010  . Primary hypercoagulable state ([place]) 06/01/2010  . BACK PAIN, CHRONIC 06/01/2010  . ABDOMINAL PAIN -GENERALIZED 06/01/2010  . CAROTID ARTERY DISEASE 05/25/2010  . CAD, NATIVE VESSEL 11/15/2009  . MURMUR 11/15/2009  . CAROTID BRUIT, LEFT 11/15/2009  . HYPERTENSION 03/18/2009  . History of pulmonary embolism 03/18/2009  . PAROXYSMAL ATRIAL FIBRILLATION 03/18/2009  . DVT 03/18/2009  . Asthma 03/18/2009  . GERD 03/18/2009  . SPONDYLOSIS, LUMBAR 03/18/2009  . HYPERLIPIDEMIA 11/29/2008  . CORONARY HEART DISEASE  11/29/2008    Orientation RESPIRATION BLADDER Height & Weight     Self, Time, Situation, Place  O2 (1 L/min) Indwelling catheter Weight: 171 lb 12.8 oz (77.9 kg) Height:  5' (152.4 cm)  BEHAVIORAL SYMPTOMS/MOOD NEUROLOGICAL BOWEL NUTRITION STATUS   (none)  (none) Continent Diet (DYS 2)  AMBULATORY STATUS COMMUNICATION OF NEEDS Skin   Extensive Assist Verbally Normal                       Personal Care Assistance Level of Assistance  Feeding, Dressing, Bathing Bathing Assistance: Limited assistance Feeding assistance: Independent Dressing Assistance: Limited assistance     Functional Limitations Info  Sight, Hearing, Speech Sight Info: Adequate Hearing Info: Adequate Speech Info: Adequate    SPECIAL CARE FACTORS FREQUENCY  PT (By licensed PT), OT (By licensed OT)     PT Frequency: 5/ week OT Frequency: 5/ week            Contractures Contractures Info: Not present    Additional Factors Info   ([place]) Code Status Info: DNR Allergies Info: Hornet Venom, Pork-derived Products, Chlorhexidine, Enoxaparin Sodium, Heparin, Lactose Intolerance (Gi), Other, Povidone-iodine, Indomethacin, Phenylpropanolamine, Povidone Iodine Psychotropic Info: traZODone (DESYREL) tablet 25 mg Dose: 25 mg Freq: Daily at bedtime Route: PO Insulin Sliding Scale Info: insulin aspart (novoLOG) injection 0-9 Units Dose: 0-9 Units Freq: Every 4 hours Route: [place]       Current Medications (06/21/2016):  This is the current hospital active medication list Current Facility-Administered Medications  Medication Dose Route Frequency Provider Last Rate Last Dose  . 0.9 %  sodium chloride infusion  Intravenous Continuous Verlee Monte, MD 75 mL/hr at 06/20/16 1311    . amiodarone (PACERONE) tablet 200 mg  200 mg Oral BID Verlee Monte, MD   200 mg at 06/21/16 0945  . arformoterol (BROVANA) nebulizer solution 15 mcg  15 mcg Nebulization BID Chesley Mires, MD   15 mcg at 06/21/16 0926  . budesonide  (PULMICORT) nebulizer solution 0.5 mg  0.5 mg Nebulization BID Chesley Mires, MD   0.5 mg at 06/21/16 0930  . famotidine (PEPCID) tablet 20 mg  20 mg Oral Daily Cecilio Asper Batchelder, RPH   20 mg at 06/21/16 0945  . feeding supplement (GLUCERNA SHAKE) (GLUCERNA SHAKE) liquid 237 mL  237 mL Oral BID BM Barton Dubois, MD      . hydrALAZINE (APRESOLINE) injection 5 mg  5 mg Intravenous Q4H PRN Praveen Mannam, MD   5 mg at 06/13/16 1334  . insulin aspart (novoLOG) injection 0-9 Units  0-9 Units Subcutaneous Q4H Praveen Mannam, MD   1 Units at 06/20/16 1226  . levalbuterol (XOPENEX) nebulizer solution 0.63 mg  0.63 mg Nebulization Q6H PRN Chesley Mires, MD      . levothyroxine (SYNTHROID, LEVOTHROID) tablet 75 mcg  75 mcg Oral QAC breakfast Reginia Naas, RPH   75 mcg at 06/21/16 0531  . MEDLINE mouth rinse  15 mL Mouth Rinse BID Rush Farmer, MD   15 mL at 06/21/16 1000  . metoprolol (LOPRESSOR) injection 5 mg  5 mg Intravenous Q6H PRN Verlee Monte, MD      . metoprolol (LOPRESSOR) tablet 50 mg  50 mg Oral BID Verlee Monte, MD   50 mg at 06/21/16 0945  . ondansetron (ZOFRAN) injection 4 mg  4 mg Intravenous Q6H PRN Erick Colace, NP   4 mg at 06/21/16 0045  . oxyCODONE (Oxy IR/ROXICODONE) immediate release tablet 5 mg  5 mg Oral Q8H PRN Barton Dubois, MD   5 mg at 06/21/16 0525  . potassium chloride SA (K-DUR,KLOR-CON) CR tablet 40 mEq  40 mEq Oral BID Barton Dubois, MD   40 mEq at 06/21/16 0944  . Rivaroxaban (XARELTO) tablet 15 mg  15 mg Oral Q supper Reginia Naas, RPH   15 mg at 06/20/16 1407  . sodium chloride flush (NS) 0.9 % injection 10-40 mL  10-40 mL Intracatheter PRN Luz Brazen, MD   10 mL at 06/21/16 0441  . traZODone (DESYREL) tablet 25 mg  25 mg Oral QHS Verlee Monte, MD   25 mg at 06/20/16 2239     Discharge Medications: Please see discharge summary for a list of discharge medications.  Relevant Imaging Results:  Relevant Lab Results:   Additional  Information [id number]  Samule Dry, LCSW

## 2016-06-21 NOTE — Progress Notes (Signed)
Speech Language Pathology Treatment: Dysphagia  Patient Details Name: TATASHA MI MRN: [id number] DOB: [birth year] Today's Date: 06/21/2016 Time: [id number] SLP Time Calculation (min) (ACUTE ONLY): 17 min  Assessment / Plan / Recommendation Clinical Impression  Pt seen for dysphagia followup. Pt continues with brighter affect and improved energy this date. Agreeable to upgraded PO trial. Pt requires increased time for mastication of advanced textures and is not ready for full upgrade, but would be okay for occasional upgraded items ex. Crackers with chicken salad. Mod I with observation of swallowing precautions.  Recommend: Continue with Dys 2 currently, but will follow and upgrade as pt's strength and endurance continues to improve. Pt and spouse verbalized understanding of recommendations and POC.   HPI HPI: 70 y/o woman with respiratory failure, severe sepsis of presumed biliary origin and AKI. She required intubation 8/14-8/18, and again 8/18-8/23. PMH: PE, PAF, HTN, HLD, HH, GERD, CAD, asthma, spondylosis requiring cervical fusion C6-C7, and cleft lip/partial palate s/p surgical intervention as a child      SLP Plan  Continue with current plan of care     Recommendations  Diet recommendations: Dysphagia 2 (fine chop);Thin liquid Liquids provided via: Cup;No straw Medication Administration: Crushed with puree Supervision: Patient able to self feed;Intermittent supervision to cue for compensatory strategies Compensations: Slow rate;Small sips/bites;Follow solids with liquid;Clear throat intermittently Postural Changes and/or Swallow Maneuvers: Seated upright 90 degrees             Oral Care Recommendations: Oral care BID Follow up Recommendations: Skilled Nursing facility Plan: Continue with current plan of care     [place] MA, [place] Pager [phone] 06/21/2016, 9:32 AM

## 2016-06-21 NOTE — Progress Notes (Signed)
Patient education given for removal of RIJ, patient verbalised understanding, RIJ dc as ordered

## 2016-06-21 NOTE — Discharge Summary (Signed)
Physician Discharge Summary  Latoya [name] [facility] [id number] DOB: [birth year] DOA: 06/04/2016  PCP: Thressa Sheller, MD  Admit date: 06/04/2016 Discharge date: 06/21/2016  Admitted From: Home Disposition: SNF  Recommendations for Outpatient Follow-up:  1. Follow up with PCP in 1-2 weeks 2. Please obtain BMP/CBC in 3 days, especially potassium. 3. Follow-up with interventional radiology, percutaneous cholecystostomy tube should be removed in 6 weeks, this is placed on 06/04/2016  Home Health: NA Equipment/Devices:NA  Discharge Condition: Stable CODE STATUS: Full Diet recommendation: Dysphagia 2 with thin liquids, SLP to follow  Brief/Interim Summary: 70 y/o WF PMHxAntiphospholipid Syndrome on AC (hx of unprovoked DVT/PE), as well as CADnative artery, HTN, PAF, HLD, Erosive gastritis , Asthma,Spondylosis, lumbosacral. Who presented to the ED with sudden onset of chills and abdominal pain. In the ED her clinical status declined, becoming less responsive, more tachycardic and more tachypneic. Imaging studies suggested an enlarged gallbladder.  Discharge Diagnoses:  Principal Problem:   Ascending cholangitis Active Problems:   PAROXYSMAL ATRIAL FIBRILLATION   Sepsis ([place])   Hemangioma of liver   Severe sepsis (HCC)   Septic shock (HCC)   Acute respiratory failure with hypoxia (HCC)   Enterococcal bacteremia   Bacteremia due to Klebsiella pneumoniae   Bacteremia due to Escherichia coli   Acute respiratory failure (HCC)   Cholangitis   Gastroparesis   Severe sepsis, probable biliary origin -S/p percutaneous cholecystomy drain placed on 06/04/16 by IR, per their recommendation keep for 6 weeks. -has completed antibiotics therapy -Continue the percutaneous biliary drain, follow-up with interventional radiology.  Escherichia coli and enterococcal bacteremia -Causing severe sepsis, source is likely biliary in origin. -Completed total 2 weeks of antibiotics.  Moderate persistent  asthma -Continue PRN nebulizer -no wheezing currently -breathing stable  Chronic systolic and diastolic CHF -Lasix 40mg  PO BID started on 8/29; no crackles and no significant edema, this is discontinued. -continue Hydralazine PRN -continue Metoprolol 5 mg IV QID; stil with problems swallowing  -Strict in and out -Daily weight  CAD native artery  -no CP -continue monitoring on telemetry   HTN -well Controlled, will monitor   Paroxysmal Atrial Fibrillation -Currently has remained in normal sinus rhythm -Continue Angiomax per pharmacy -Continue amiodarone drip: Consider transitioning to PO when patient's PO status is acceptable and no further nausea/vomiting.  Acute renal failure -Baseline creatinine is normal at 0.68 on 06/04/16, this reach peak of 4.1 on 02/11/16 -Multifactorial secondary to sepsis, bacteremia and hypertension. -This is improving, patient has mild fluid overload from IV fluid but she is in the recovery phase from acute renal failure. -She is putting a lot of urine without diuresis, follow closely especially potassium and magnesium.  Antiphospholipid Ab Syndrome -On Angiomax, was on Xarelto at home will restart oral anticoagulation. -patient allergic to heparin products   Hypothyroidism -continue Synthroid 37 g daily  Hypokalemia  -Potassium is 3.2 on day of discharge given 60 mEq. -Follow potassium closely.  Hypomagnesemia  -Magnesium goal> 2 -will continue repletion as needed -most likely secondary to Poor PO intake and also active cholecystostomy drainage   Dysphagia -will follow SPL diet consistency recommendations -currently on dysphagia 2 with thin liquids. -poor appetite, hopefully can take oral medications.   Chronic pain and multiple cervical surgeries  -will continue use of oxycodone and slowly resume home pain regimen as needed   Acute hypoxic respiratory failure -Ventilator dependent, patient was intubated and mechanically  ventilated, extubated on 8/23 doing well since then.  Discharge Instructions  Discharge Instructions    Diet - low  sodium heart healthy    Complete by:  As directed   Increase activity slowly    Complete by:  As directed       Medication List    STOP taking these medications   cyclobenzaprine 10 MG tablet Commonly known as:  FLEXERIL   diazepam 5 MG tablet Commonly known as:  VALIUM     TAKE these medications   albuterol 108 (90 Base) MCG/ACT inhaler Commonly known as:  PROVENTIL HFA;VENTOLIN HFA Inhale 2 puffs into the lungs every 6 (six) hours as needed for wheezing or shortness of breath.   amiodarone 200 MG tablet Commonly known as:  PACERONE Take 1 tablet (200 mg total) by mouth daily.   aspirin 81 MG EC tablet Take 81 mg by mouth daily.   ASTELIN 0.1 % nasal spray Generic drug:  azelastine Place 1 spray into the nose 2 (two) times daily. Use in each nostril as directed For allergies   atorvastatin 40 MG tablet Commonly known as:  LIPITOR Take 40 mg by mouth daily.   atorvastatin 20 MG tablet Commonly known as:  LIPITOR Take 1 tablet (20 mg total) by mouth daily.   beclomethasone 80 MCG/ACT inhaler Commonly known as:  QVAR Inhale 2 puffs into the lungs 2 (two) times daily. What changed:  when to take this   CALCIUM 500 +D 500-400 MG-UNIT Tabs Generic drug:  Calcium Carb-Cholecalciferol Take 1 tablet by mouth 2 (two) times daily.   CLARITIN-D 12 HOUR PO Take 1 tablet by mouth 2 (two) times daily.   docusate sodium 100 MG capsule Commonly known as:  COLACE Take 200 mg by mouth 2 (two) times daily.   EPINEPHrine 0.3 mg/0.3 mL Soaj injection Commonly known as:  EPIPEN Inject 0.3 mLs (0.3 mg total) into the muscle once. What changed:  when to take this  reasons to take this   esomeprazole 40 MG capsule Commonly known as:  NEXIUM Take 40 mg by mouth 2 (two) times daily.   FentaNYL 37.5 MCG/HR Pt72 Place 1 patch onto the skin every 3 (three)  days.   FLUTTER Devi Use as directed   Glucosamine-Chondroitin 750-600 MG Tabs Take 1 tablet by mouth 2 (two) times daily.   HYDROmorphone 4 MG tablet Commonly known as:  DILAUDID Take 1 tablet (4 mg total) by mouth every 4 (four) hours as needed. For pain What changed:  how much to take   levothyroxine 75 MCG tablet Commonly known as:  SYNTHROID, LEVOTHROID Take 75 mcg by mouth daily before breakfast.   lidocaine 5 % Commonly known as:  LIDODERM Place 1 patch onto the skin daily. Remove & Discard patch within 12 hours or as directed by MD   Melatonin 2.5 MG Caps Take 2.5-5 mg by mouth at bedtime.   metaxalone 800 MG tablet Commonly known as:  SKELAXIN Take 800 mg by mouth every 8 (eight) hours as needed. For muscle pain   metoprolol 50 MG tablet Commonly known as:  LOPRESSOR Take 1 tablet (50 mg total) by mouth 2 (two) times daily.   montelukast 10 MG tablet Commonly known as:  SINGULAIR Take 10 mg by mouth at bedtime.   multivitamin with minerals Tabs tablet Take 1 tablet by mouth daily.   NITROSTAT 0.4 MG SL tablet Generic drug:  nitroGLYCERIN DISSOLVE 1 TABLET UNDER TONGUE AS NEEDED FOR CHEST PAIN,MAY REPEAT IN5 MINUTES FOR 2 DOSES.   Rivaroxaban 15 MG Tabs tablet Commonly known as:  XARELTO Take 1 tablet (15 mg total)  by mouth daily with supper. What changed:  medication strength  how much to take   triamcinolone 55 MCG/ACT nasal inhaler Commonly known as:  NASACORT Place 1-2 sprays into the nose 2 (two) times daily.      Follow-up [facility], [place]. Schedule an appointment as soon as possible for a visit in 3 week(s).   Specialty:  General Surgery Contact information: [place] [address] [phone]         Allergies  Allergen Reactions  . Hornet Venom Anaphylaxis  . Pork-Derived Products Anaphylaxis  . Chlorhexidine Itching    Reports that it is the dye in it   . Enoxaparin Sodium     REACTION: thrombocytopenia  . Heparin     REACTION: thrombocytopenia  . Lactose Intolerance ([place])   . Other     Sensitive to dye in Betadine & Chlorohexadine   . Povidone-Iodine     Sensitivity- but if its wiped off she is able to tolerate betadine   . Indomethacin Rash  . Phenylpropanolamine Rash    Other Reaction: other reaction  . Povidone Iodine Rash    Consultations:  Gen. Surgery.  Nephrology.  Interventional radiology   Procedures/Studies: Dg Chest 2 View  Result Date: 06/04/2016 CLINICAL DATA:  Mid chest pain with shortness of breath and chills tonight. EXAM: CHEST  2 VIEW COMPARISON:  12/30/2014 FINDINGS: Postoperative changes in the cervical and thoracolumbar spine. Thoracolumbar scoliosis. Shallow inspiration. Heart size is obscured by the abdominal contents but does not appear enlarged grossly. Linear scarring in the right mid and lower lung appears similar to previous study. Elevation of the right hemidiaphragm. No focal consolidation or edema. No blunting of costophrenic angles. No pneumothorax. IMPRESSION: No evidence of active pulmonary disease. Stable appearance of chronic changes since previous study. Electronically Signed   By: Lucienne Capers M.D.   On: 06/04/2016 00:32   Dg Abd 1 View  Result Date: 06/07/2016 CLINICAL DATA:  Feeding tube placement. EXAM: ABDOMEN - 1 VIEW; DG NASO G TUBE PLC W/FL-NO RAD COMPARISON:  06/04/2016. FINDINGS: The feeding tube was placed under fluoroscopic guidance by [employer], RT. A single spot image of the mid abdomen was obtained after injection of barium through the tube. This demonstrates the tube extending through the stomach and into the proximal small bowel with its tip obscured by overlying spinal hardware. The injected contrast is filling the proximal jejunum. IMPRESSION: Feeding tube tip in the proximal jejunum, just beyond the ligament of Treitz, obscured by overlying hardware. Electronically  Signed   By: Claudie Revering M.D.   On: 06/07/2016 10:21   Ir Perc Cholecystostomy  Result Date: 06/04/2016 INDICATION: Acute cholecystitis, septic shock EXAM: CHOLECYSTOSTOMY MEDICATIONS: Patient is already receiving IV antibiotics. ANESTHESIA/SEDATION: Moderate Sedation Time: None. The patient's level of consciousness and vital signs were monitored continuously by radiology nursing throughout the procedure under my direct supervision. FLUOROSCOPY TIME:  Fluoroscopy Time: None. COMPLICATIONS: None immediate. PROCEDURE: Informed written consent was obtained from the patient's family after a thorough discussion of the procedural risks, benefits and alternatives. All questions were addressed. Maximal Sterile Barrier Technique was utilized including caps, mask, sterile gowns, sterile gloves, sterile drape, hand hygiene and skin antiseptic. A timeout was performed prior to the initiation of the procedure. Previous imaging reviewed. Procedure was performed at the bedside in the ICU because of the patient's clinical status. Preliminary ultrasound performed. The distended gallbladder in the right upper quadrant was  localized between the lower ribs. Under sterile conditions and local anesthesia, ultrasound percutaneous transhepatic needle access performed of the gallbladder. Needle position confirmed in the gallbladder. Images obtained for documentation. There was return of bile. Sample sent for Gram stain and culture. 018 guidewire advanced followed by the Accustick dilator set. This allowed insertion of the Accustick dilator set and exchange of the guidewire for an Amplatz guidewire. Tract dilatation performed to insert a 10 [place] drain. Catheter position confirmed with ultrasound. Syringe aspiration yielded additional bile. Catheter secured with a Prolene suture and connected to external gravity drainage bag. Sterile dressing applied. No immediate complication. Patient tolerated the procedure well. IMPRESSION:  Successful ultrasound percutaneous transhepatic cholecystostomy. Electronically Signed   By: Jerilynn Mages.  Shick M.D.   On: 06/04/2016 16:25   Dg Chest Port 1 View  Result Date: 06/16/2016 CLINICAL DATA:  Acute respiratory failure EXAM: PORTABLE CHEST 1 VIEW COMPARISON:  June 15, 2016 FINDINGS: The heart size and mediastinal contours are stable. Patchy consolidation throughout bilateral lungs are identified unchanged compared prior exam. There is probably small left pleural effusion unchanged. Right central venous line is identified distal tip in the superior vena cava unchanged. The visualized skeletal structures are stable. IMPRESSION: Persistent patchy consolidation throughout bilateral lungs unchanged can be due to either edema or pneumonia. Persistent small left pleural effusion. Electronically Signed   By: Abelardo Diesel M.D.   On: 06/16/2016 07:35   Dg Chest Port 1 View  Result Date: 06/15/2016 CLINICAL DATA:  70 y/o  F; acute respiratory failure. EXAM: PORTABLE CHEST 1 VIEW COMPARISON:  06/14/2016 chest radiograph. FINDINGS: Mild interval improvement of the lungs bilaterally. Persistent diffuse patchy opacities of left greater than right lungs may represent pulmonary edema or pneumonia. Right central venous catheter tip projects over mid SVC. Cervical fusion hardware and thoracolumbar partially visualized fusion mass noted. Stable probable small effusions. IMPRESSION: Mild improvement in aeration of the lungs. Persistent opacities may represent edema or pneumonia. Stable probable small effusions. Electronically Signed   By: Kristine Garbe M.D.   On: 06/15/2016 06:35   Dg Chest Port 1 View  Result Date: 06/14/2016 CLINICAL DATA:  70 y/o  F; acute respiratory failure. EXAM: PORTABLE CHEST 1 VIEW COMPARISON:  Chest radiograph 06/12/2016. FINDINGS: Right central venous catheter tip projects over lower SVC. Interval removal of enteric tube. Worsening airspace opacities diffusely and small effusions  probably representing pulmonary edema, possibly pneumonia. Surgical hardware partially visualized of cervical and thoracolumbar spinal fusion. No acute osseous abnormality. IMPRESSION: Worsening diffuse airspace opacities and small pleural effusions probably representing pulmonary edema, possibly pneumonia. Electronically Signed   By: Kristine Garbe M.D.   On: 06/14/2016 05:45   Dg Chest Port 1 View  Result Date: 06/12/2016 CLINICAL DATA:  70 y/o  F; acute respiratory failure. EXAM: PORTABLE CHEST 1 VIEW COMPARISON:  Chest radiograph dated 06/11/2016. FINDINGS: Stable mid and basilar opacities and small effusions. No pneumothorax. Endotracheal tube unchanged in position. Right central venous catheter tip projects over lower SVC. Enteric tube extends below the field of view and abdomen. Anterior cervical fusion hardware and lower thoracic fusion hardware noted. IMPRESSION: Stable bilateral mid and basilar opacities. Small bilateral effusions. Stable lines and tubes. Electronically Signed   By: Kristine Garbe M.D.   On: 06/12/2016 06:14   Dg Chest Portable 1 View  Result Date: 06/11/2016 CLINICAL DATA:  Shortness of breath EXAM: PORTABLE CHEST 1 VIEW COMPARISON:  Chest radiograph 06/10/2016 FINDINGS: Endotracheal tube tip appears to be just above the carina, though  the carina is difficult to visualize. Enteric tube courses beyond the field of view. Multilevel spinal fixation hardware is again noted. Unchanged position of right IJ central venous catheter. There is improved lung aeration, particularly at the left base. Small left pleural effusion again noted. No new areas of consolidation. IMPRESSION: 1. Improved lung aeration, particularly at the left lung base. No new areas of consolidation. 2. Endotracheal tube tip appears to be just above the level of the carina. However, the carina is difficult to visualize on this examination. Electronically Signed   By: Ulyses Jarred M.D.   On:  06/11/2016 06:16   Dg Chest Portable 1 View  Result Date: 06/10/2016 CLINICAL DATA:  Respiratory failure. EXAM: PORTABLE CHEST 1 VIEW COMPARISON:  1 day prior FINDINGS: Patient rotated right. Endotracheal tube terminates 2.6 cm above carina.Right internal jugular line tip at mid SVC. Feeding tube extends beyond the inferior aspect of the film. Normal heart size. Right hemidiaphragm elevation. No definite pleural fluid. No pneumothorax. Left worse than right airspace disease. not significantly changed. IMPRESSION: No change in left greater than right airspace disease which could represent pneumonia and/or pulmonary edema. Electronically Signed   By: Abigail Miyamoto M.D.   On: 06/10/2016 09:26   Dg Chest Port 1 View  Result Date: 06/09/2016 CLINICAL DATA:  Subsequent encounter. Intubated patient. Respiratory distress. EXAM: PORTABLE CHEST 1 VIEW COMPARISON:  06/08/2016 FINDINGS: Patchy airspace opacity in the left mid and lower lung is similar to the prior exam. Opacity in the right lung has mildly improved. There are no new lung opacities. There probable pleural effusions, small. No pneumothorax. Endotracheal tube is stable with its tip 2.7 cm above the carina. Nasal/orogastric tube passes below the diaphragm well into the stomach. Right internal jugular central venous line tip projects in the lower superior vena cava, also stable. IMPRESSION: 1. Mild improvement in lung aeration with decreased lung opacity in the right lung. Stable airspace opacity noted in the left lung. 2. No new abnormalities. 3. Support apparatus is stable and well positioned. Electronically Signed   By: Lajean Manes M.D.   On: 06/09/2016 07:38   Dg Chest Port 1 View  Result Date: 06/08/2016 CLINICAL DATA:  Acute onset of respiratory failure. Endotracheal tube placement. Initial encounter. EXAM: PORTABLE CHEST 1 VIEW COMPARISON:  Chest radiograph performed earlier today at 4:23 a.m. FINDINGS: The patient's endotracheal tube is seen  ending 3 cm above the carina. An enteric tube is noted extending below the diaphragm. The right IJ line is noted ending about the mid to distal SVC. Vascular congestion is noted. Bilateral airspace opacification raises concern for pulmonary edema. A small right pleural effusion is noted. No pneumothorax is identified. The cardiomediastinal silhouette is borderline enlarged. No acute osseous abnormalities are identified. Thoracolumbar spinal fusion hardware is partially imaged. IMPRESSION: 1. Endotracheal tube seen ending 3 cm above the carina. 2. Vascular congestion and borderline cardiomegaly noted. Bilateral airspace opacification raises concern for pulmonary edema. Small right pleural effusion noted. Electronically Signed   By: Garald Balding M.D.   On: 06/08/2016 22:36   Dg Chest Port 1 View  Result Date: 06/08/2016 CLINICAL DATA:  Respiratory failure.  Assess endotracheal tube. EXAM: PORTABLE CHEST 1 VIEW COMPARISON:  06/07/2016 FINDINGS: Endotracheal tube is 2.3 cm above the carina. Nasogastric tube and feeding tube extend into the abdomen. Right jugular central line tip is in the SVC region. There continues to be low lung volumes with bibasilar chest densities. Right basilar chest densities probably represent a  combination of pleural fluid and consolidation. Difficult to exclude mild pulmonary edema. Surgical hardware in the cervical spine and lumbar spine. Negative for a pneumothorax. IMPRESSION: Low lung volumes with persistent bibasilar opacities, right side greater than left. Increased densities in both lungs may represent some pulmonary edema. Support apparatuses as described. Electronically Signed   By: Markus Daft M.D.   On: 06/08/2016 06:58   Dg Chest Port 1 View  Result Date: 06/07/2016 CLINICAL DATA:  70 y/o  F; cholecystitis. EXAM: PORTABLE CHEST 1 VIEW COMPARISON:  Chest x-ray dated 06/06/2016. FINDINGS: Postsurgical changes related to cervical and thoracolumbar fixation partially  visualized. Central venous catheter tip projects over the lower SVC. Endotracheal tube is stable in position. Enteric tube tip projects over mid stomach. Worsening right lung volumes and basilar opacity. Stable left basilar opacity. No pneumothorax. Cardiac silhouette is obscured by basilar opacities. IMPRESSION: Bibasilar opacities increased on the right probably represent atelectasis and effusions. Underlying pneumonia is not excluded. Electronically Signed   By: Kristine Garbe M.D.   On: 06/07/2016 05:33   Dg Chest Port 1 View  Result Date: 06/06/2016 CLINICAL DATA:  Intubation. EXAM: PORTABLE CHEST 1 VIEW COMPARISON:  08/15/ 2017. FINDINGS: Endotracheal tube and NG tube in stable position. Low lung volumes with persist bibasilar atelectasis and/or infiltrates. No interim change. Small right pleural effusion cannot be excluded. No pneumothorax. Heart size stable. Prior cervical thoracolumbar spine. IMPRESSION: 1. Lines tubes in position. 2. Low lung volumes with bibasilar atelectasis and/or infiltrates, no interim change. Small right pleural effusion cannot be excluded Electronically Signed   By: [name]  Register   On: 06/06/2016 06:55   Dg Chest Port 1 View  Result Date: 06/05/2016 CLINICAL DATA:  Shortness of breath. EXAM: PORTABLE CHEST 1 VIEW COMPARISON:  06/04/2016. FINDINGS: Endotracheal tube, NG tube, right IJ line stable position. Heart size normal. Bibasilar atelectasis and/or infiltrates. Small bilateral pleural effusions. No pneumothorax . Right upper quadrant drainage catheter noted. Prior cervical and thoracolumbar spine fusion . IMPRESSION: 1. Lines and tubes in stable position. 2. Persistent bibasilar atelectasis and/or infiltrates. No change from prior exam. Small bilateral pleural effusions noted on today's exam. Electronically Signed   By: [name]  Register   On: 06/05/2016 07:05   Dg Chest Port 1 View  Result Date: 06/04/2016 CLINICAL DATA:  70 year old female with a  history of line placement EXAM: PORTABLE CHEST 1 VIEW COMPARISON:  06/04/2016 FINDINGS: Significant right rotation the patient limits evaluation. Endotracheal tube terminates suitably above the carina, measuring 4.2 cm. Interval placement of right IJ approach central venous catheter which appears to terminate superior vena cava. No pneumothorax. Mixed interstitial and airspace opacities bilateral lungs, similar to the prior. Surgical changes of cervical region in the thoracolumbar junction, incompletely imaged. IMPRESSION: Interval placement of right IJ approach central venous catheter without complicating features. Tip appears to terminate superior vena cava. Unchanged endotracheal tube. Similar appearance of mixed interstitial and airspace disease bilateral lung bases with low lung volumes. Signed, Dulcy Fanny. Earleen Newport, DO [facility] Electronically Signed   By: Corrie Mckusick D.O.   On: 06/04/2016 14:28   Dg Chest Port 1 View  Result Date: 06/04/2016 CLINICAL DATA:  Initial evaluation status post intubation. EXAM: PORTABLE CHEST 1 VIEW COMPARISON:  Prior radiograph from 06/03/2016. FINDINGS: There has been interval placement of an endotracheal tube with tip position 2.3 cm above the carina. Enteric tube courses in the the abdomen. Cardiac and mediastinal silhouettes are stable. Persistent elevation of the  right hemidiaphragm with associated right basilar atelectasis/ scarring. Probable left basilar atelectasis present as well. There is increased vascular congestion as compared to prior without frank pulmonary edema. No pneumothorax. Osseous structures unchanged.  Spinal fixation hardware noted. IMPRESSION: 1. Tip of the endotracheal to 2.3 cm above the carina. 2. Slightly increased vascular congestion as compared to prior without overt pulmonary edema. 3. Otherwise stable appearance of the chest. Electronically Signed   By: Jeannine Boga M.D.   On:  06/04/2016 06:23   Dg Abd Portable 1v  Result Date: 06/04/2016 CLINICAL DATA:  Check gastric catheter placement EXAM: PORTABLE ABDOMEN - 1 VIEW COMPARISON:  None. FINDINGS: Gastric catheter is now noted within the stomach. Significant previous orthopedic hardware is noted within the lumbosacral spine. Fractured screw is again noted extending into the ilium on the left stable from the previous exam. No acute bony abnormality is noted. No free air is seen. IMPRESSION: Gastric catheter within the stomach as described. Electronically Signed   By: Inez Catalina M.D.   On: 06/04/2016 08:29   Dg Addison Bailey G Tube Plc W/fl-no Rad  Result Date: 06/07/2016 CLINICAL DATA:  NASO G TUBE PLACEMENT WITH FLUORO Fluoroscopy was utilized by the requesting physician.  No radiographic interpretation.   Dg Swallowing Func-speech Pathology  Result Date: 06/16/2016 Objective Swallowing Evaluation: Type of Study: MBS-Modified Barium Swallow Study Patient Details Name: HOANG TAL MRN: [id number] Date of Birth: 01/21/1946 Today's Date: 06/16/2016 Time: SLP Start Time (ACUTE ONLY): 1330-SLP Stop Time (ACUTE ONLY): 1355 SLP Time Calculation (min) (ACUTE ONLY): 25 min Past Medical History: Past Medical History: Diagnosis Date . Anemia, iron deficiency  . Antiphospholipid syndrome (HCC)   with hypercoagulable state . Asthma   extrinsic; moderate, persistant, nml spirometry 2010, nml CXR 1/08 . Bladder troubles   REPORTS INFECTIONS ON OCCASION DUE TO URETHRA MEATUS STRICTURE AT BIRTH  . Blood dyscrasia   antiphospholipid disorder . CAD (coronary artery disease)   50% mid LAD, 80% ostial D1 and moderate 80% mid circ by vath 2003 . Cancer ([place])   basal cell removed fr. L arm  . Complication of anesthesia   cardiac arrest- in OR, at age 77 (76)y.o. during Scalenotomy in her early 20's . Coronary heart disease  . Drug allergy   heparin/lovenox . DVT (deep venous thrombosis) ([place])  . Erosive gastritis 1994 . Family history of adverse reaction  to anesthesia   some family members have had trouble waking up . GERD (gastroesophageal reflux disease)  . H/O hiatal hernia  . HLD (hyperlipidemia)  . HTN (hypertension)   [name] at [facility], manages pt.  LOV 03/2014 . Hypothyroidism  . Liver spot   cyst- - no biopsy, but told that its benign  . PAF (paroxysmal atrial fibrillation) (HCC)   not on coumadin therapy . Pulmonary embolism ([place])   related to back surgery with prior coumadin use, now off . Spondylosis, lumbosacral   ARTHRITIS- OA  Past Surgical History: Past Surgical History: Procedure Laterality Date . ABDOMINAL HYSTERECTOMY    partial abdominal -1998 . BACK SURGERY    x13 cervical and lumbar thoracic spine surgery . BREAST ENHANCEMENT SURGERY   . BREAST SURGERY    all benign cysts x3  . CARDIAC CATHETERIZATION    2005 . cleft lip and palate repair    70 yo  . hysterectomy   . IR GENERIC HISTORICAL  06/04/2016  IR PERC CHOLECYSTOSTOMY 06/04/2016 Greggory Keen, MD [facility] . JOINT REPLACEMENT   . POSTERIOR CERVICAL FUSION/FORAMINOTOMY Right  08/31/2014  Procedure: Right cervical six-seven foraminotomy, Cervical five-Thoracic one fusion, Cervical six-seven lateral mass screws;  Surgeon: Erline Levine, MD;  Location: [facility];  Service: Neurosurgery;  Laterality: Right; . spina bifida repair    70 yo  . TONSILLECTOMY   . TOTAL KNEE ARTHROPLASTY    left HPI: 70 y/o woman with respiratory failure, severe sepsis of presumed biliary origin and AKI. She required intubation 8/14-8/18, and again 8/18-8/23. PMH: PE, PAF, HTN, HLD, HH, GERD, CAD, asthma, spondylosis requiring cervical fusion C6-C7, and cleft lip/partial palate s/p surgical intervention as a child Subjective: Alert, cooperative Assessment / Plan / Recommendation CHL IP CLINICAL IMPRESSIONS 06/16/2016 Therapy Diagnosis Mild oral phase dysphagia;Moderate oral phase dysphagia;Moderate pharyngeal phase dysphagia Clinical Impression Patient presents with a mild-mod oral and moderate pharyngeal  dysphagia which appears to be largely motor-based, but with some impact of sensory impairment at pharyngeal phase. Patient's oral dysphagia is characterized by decreased ability to masticate and orally manipulate and move solid texture bolus anterior to posterior swallow initiation delay to vallecular sinus, and patient unable to transit pill anterior to posterior and had to spit it out. Patient's pharyngeal phase dysphagia is characterized by swallow initiation delays to vallecular sinus with solid and liquid boluses, moderate vallecular residuals from regular and puree solids, which slowly cleared with subsequent swallows and sips of liquids. Despite SLP instructing patient to take small sips, she took large volume sips of liquids, which led to flash penetration of nectar thick liquids and consistent penetration without full clearance with thin liquids. She did still exhibit flash penetration with thin liquids when SLP gave her a controlled, small sip. Nectar thick liquids resulted in increased residuals, and did not significantly reduce risk of penetration, and so thin liquids are recommended, with small sips. Impact on safety and function Moderate aspiration risk   CHL IP TREATMENT RECOMMENDATION 06/16/2016 Treatment Recommendations Therapy as outlined in treatment plan below   Prognosis 06/16/2016 Prognosis for Safe Diet Advancement Good Barriers to Reach Goals -- Barriers/Prognosis Comment -- CHL IP DIET RECOMMENDATION 06/16/2016 SLP Diet Recommendations Dysphagia 2 (Fine chop) solids;Thin liquid Liquid Administration via Cup;No straw Medication Administration Crushed with puree Compensations Slow rate;Small sips/bites;Follow solids with liquid;Clear throat intermittently Postural Changes Seated upright at 90 degrees   CHL IP OTHER RECOMMENDATIONS 06/16/2016 Recommended Consults -- Oral Care Recommendations Oral care BID Other Recommendations --   CHL IP FOLLOW UP RECOMMENDATIONS 06/16/2016 Follow up  Recommendations Skilled Nursing facility   [facility] IP FREQUENCY AND DURATION 06/16/2016 Speech Therapy Frequency (ACUTE ONLY) min 2x/week Treatment Duration 2 weeks      CHL IP ORAL PHASE 06/16/2016 Oral Phase Impaired Oral - Pudding Teaspoon -- Oral - Pudding Cup -- Oral - Honey Teaspoon -- Oral - Honey Cup -- Oral - Nectar Teaspoon -- Oral - Nectar Cup -- Oral - Nectar Straw -- Oral - Thin Teaspoon -- Oral - Thin Cup -- Oral - Thin Straw -- Oral - Puree Weak lingual manipulation;Delayed oral transit Oral - Mech Soft -- Oral - Regular Impaired mastication;Delayed oral transit Oral - Multi-Consistency -- Oral - Pill Other (Comment);Reduced posterior propulsion Oral Phase - Comment --  CHL IP PHARYNGEAL PHASE 06/16/2016 Pharyngeal Phase Impaired Pharyngeal- Pudding Teaspoon -- Pharyngeal -- Pharyngeal- Pudding Cup -- Pharyngeal -- Pharyngeal- Honey Teaspoon -- Pharyngeal -- Pharyngeal- Honey Cup -- Pharyngeal -- Pharyngeal- Nectar Teaspoon -- Pharyngeal -- Pharyngeal- Nectar Cup Pharyngeal residue - valleculae;Pharyngeal residue - pyriform;Pharyngeal residue - posterior pharnyx;Penetration/Aspiration during swallow Pharyngeal Material enters airway, remains  ABOVE vocal cords then ejected out Pharyngeal- Nectar Straw -- Pharyngeal -- Pharyngeal- Thin Teaspoon -- Pharyngeal -- Pharyngeal- Thin Cup Delayed swallow initiation-vallecula;Reduced airway/laryngeal closure;Penetration/Aspiration during swallow;Pharyngeal residue - valleculae;Pharyngeal residue - pyriform Pharyngeal Material enters airway, remains ABOVE vocal cords and not ejected out Pharyngeal- Thin Straw Delayed swallow initiation-vallecula;Delayed swallow initiation-pyriform sinuses;Reduced airway/laryngeal closure;Penetration/Aspiration during swallow;Pharyngeal residue - posterior pharnyx Pharyngeal Material enters airway, remains ABOVE vocal cords and not ejected out Pharyngeal- Puree Delayed swallow initiation-vallecula;Pharyngeal residue - valleculae  Pharyngeal -- Pharyngeal- Mechanical Soft -- Pharyngeal -- Pharyngeal- Regular Delayed swallow initiation-vallecula;Pharyngeal residue - valleculae Pharyngeal -- Pharyngeal- Multi-consistency -- Pharyngeal -- Pharyngeal- Pill -- Pharyngeal -- Pharyngeal Comment --  CHL IP CERVICAL ESOPHAGEAL PHASE 06/16/2016 Cervical Esophageal Phase WFL Pudding Teaspoon -- Pudding Cup -- Honey Teaspoon -- Honey Cup -- Nectar Teaspoon -- Nectar Cup -- Nectar Straw -- Thin Teaspoon -- Thin Cup -- Thin Straw -- Puree -- Mechanical Soft -- Regular -- Multi-consistency -- Pill -- Cervical Esophageal Comment -- CHL IP GO 03/26/2014 Functional Assessment Tool Used Clinical judgement Functional Limitations Swallowing Swallow Current Status [id number]) CI Swallow Goal Status [id number]) CI Swallow Discharge Status [id number]) CI Motor Speech Current Status [id number]) (None) Motor Speech Goal Status [id number]) (None) Motor Speech Goal Status [id number]) (None) Spoken Language Comprehension Current Status [id number]:[id number]) (None) Spoken Language Comprehension Goal Status [id number]) (None) Spoken Language Comprehension Discharge Status [id number]) (None) Spoken Language Expression Current Status [id number]) (None) Spoken Language Expression Goal Status [id number]) (None) Spoken Language Expression Discharge Status [phone]) (None) Attention Current Status LV:[id number]) (None) Attention Goal Status FV:[id number]) (None) Attention Discharge Status [id number]) (None) Memory Current Status AE:130515) (None) Memory Goal Status GI:[id number]) (None) Memory Discharge Status [id number]) (None) Voice Current Status [id number]) (None) Voice Goal Status [id number]) (None) Voice Discharge Status [id number]) (None) Other Speech-Language Pathology Functional Limitation [phone]) (None) Other Speech-Language Pathology Functional Limitation Goal Status [id number]) (None) Other Speech-Language Pathology Functional Limitation Discharge Status [phone]) (None)      Sonia Baller, MA, CCC-SLP 06/16/16 5:04 PM         Ct Angio Abd/pel W And/or Wo  Contrast  Result Date: 06/04/2016 CLINICAL DATA:  Acute onset of generalized chest and abdominal pain. Initial encounter. EXAM: CTA ABDOMEN AND PELVIS wITHOUT AND WITH CONTRAST TECHNIQUE: Multidetector CT imaging of the abdomen and pelvis was performed using the standard protocol during bolus administration of intravenous contrast. Multiplanar reconstructed images and MIPs were obtained and reviewed to evaluate the vascular anatomy. CONTRAST:  100 mL of Isovue 370 IV contrast COMPARISON:  CT of the abdomen and pelvis from 11/19/2005 FINDINGS: Patchy bibasilar airspace opacities may reflect pneumonia, or possibly scarring. Bilateral breast implants are noted; there is chronic rupture of the right-sided breast implant, with leakage into the right lateral breast. The chronically ruptured appearance is stable from 2007. There is no evidence of aortic dissection. There is no evidence of aneurysmal dilatation. Mild calcification is seen along the distal abdominal aorta and its branches. The celiac trunk, superior mesenteric artery, bilateral renal arteries and inferior mesenteric artery appear patent. The inferior vena cava is grossly unremarkable in appearance. Note is made of a retroaortic left renal vein. A 7.1 cm hypodense mass is noted at the right hepatic lobe, with prominent peripheral vasculature, likely reflecting a large hemangioma. The spleen is unremarkable in appearance. The gallbladder is diffusely distended, with trace pericholecystic fluid, and dilatation of the common bile duct to 1.3 cm in diameter, concerning for distal obstruction. Prominent intrahepatic biliary ducts are noted. The pancreas and adrenal glands  are grossly unremarkable in appearance. The kidneys are unremarkable in appearance. There is no evidence of hydronephrosis. No renal or ureteral stones are seen. No perinephric stranding is appreciated. No free fluid is identified. The small bowel is unremarkable in appearance. The stomach is  within normal limits. No acute vascular abnormalities are seen. The appendix is not definitely characterized; there is no evidence of appendicitis. The sigmoid colon is somewhat thick walled, though this would could reflect intraluminal contents. Would correlate for any associated symptoms. The bladder is mildly distended and grossly unremarkable. The patient is status post hysterectomy. No suspicious adnexal masses are seen. No inguinal lymphadenopathy is seen. No acute osseous abnormalities are identified. Thoracolumbar spinal fusion hardware is noted, with underlying decompression at the lower lumbar spine. Multilevel vacuum phenomenon is noted along the lower thoracic spine, with associated diffuse sclerosis. Review of the MIP images confirms the above findings. IMPRESSION: 1. No evidence of aortic dissection. No evidence of aneurysmal dilatation. Mild calcification along the distal abdominal aorta and its branches. 2. Diffuse gallbladder distention, with trace pericholecystic fluid, and dilatation of the common bile duct to 1.3 cm in diameter, concerning for distal obstruction. Intrahepatic biliary ductal dilatation noted. MRCP would be helpful for further evaluation, to assess for underlying mass. 3. Somewhat thick-walled appearance to the sigmoid colon. This could simply reflect intraluminal contents, though would correlate for any associated symptoms. 4. Patchy bibasilar airspace opacities may reflect pneumonia, or possibly scarring. 5. 7.1 cm right hepatic mass, with prominent peripheral vasculature, likely reflects a large benign hemangioma. This has increased from 4.3 cm in 2007. 6. Chronic rupture of the patient's right-sided breast implant, stable from 2007, with leakage into the right lateral breast, better characterized than in 2007. 7. Mild degenerative change along the lower thoracic spine. Electronically Signed   By: Garald Balding M.D.   On: 06/04/2016 03:26   US Abdomen Limited Ruq  Result  Date: 06/04/2016 CLINICAL DATA:  Initial evaluation for EXAM: US ABDOMEN LIMITED - RIGHT UPPER QUADRANT COMPARISON:  Comparison made with concomitant CT of the abdomen and pelvis performed on the same day. FINDINGS: Gallbladder: No gallstones or wall thickening visualized. No sonographic Murphy sign noted by sonographer. Small amount of free pericholecystic fluid noted. Common bile duct: Diameter: 7 mm.  Felt to be within normal limits for age. Liver: Complex mass measuring 7.9 x 7.8 x 8.1 cm. This is better evaluated on concomitant CT. IMPRESSION: 1. Small amount of free pericholecystic fluid without other sonographic features to suggest acute cholecystitis. This may be related to overall volume status. No cholelithiasis or biliary dilatation. 2. 7.9 x 7.8 x 8.1 cm complex hepatic mass, better evaluated on concomitant CT the abdomen and pelvis. Electronically Signed   By: Jeannine Boga M.D.   On: 06/04/2016 03:03    (Echo, Carotid, EGD, Colonoscopy, ERCP)    Subjective:   Discharge Exam: Vitals:   06/21/16 0447 06/21/16 0905  BP: (!) 148/55 (!) 135/102  Pulse: 84 88  Resp: 18 18  Temp: 97.6 F (36.4 C)    Vitals:   06/20/16 2041 06/21/16 0447 06/21/16 0905 06/21/16 0928  BP:  (!) 148/55 (!) 135/102   Pulse: 76 84 88   Resp: 16 18 18    Temp:  97.6 F (36.4 C)    TempSrc:  Oral    SpO2: 98% 93%  97%  Weight:  77.9 kg (171 lb 12.8 oz)    Height:        General: Pt is alert,  awake, not in acute distress Cardiovascular: RRR, S1/S2 +, no rubs, no gallops Respiratory: CTA bilaterally, no wheezing, no rhonchi Abdominal: Soft, NT, ND, bowel sounds + Extremities: no edema, no cyanosis    The results of significant diagnostics from this hospitalization (including imaging, microbiology, ancillary and laboratory) are listed below for reference.     Microbiology: No results found for this or any previous visit (from the past 240 hour(s)).   Labs: BNP (last 3 results) No  results for input(s): BNP in the last 8760 hours. Basic Metabolic Panel:  Recent Labs Lab 06/15/16 0340 06/16/16 0444 06/17/16 0500  06/17/16 1535 06/18/16 0516 06/19/16 0420 06/20/16 0436 06/21/16 0442  NA 152*  151* 149* 143  --   --  136 138 137 141  K 3.0*  3.0* 2.4* 2.5*  --  3.9 3.1* 3.0* 3.2* 3.2*  CL 104  105 101 96*  --   --  91* 91* 91* 99*  CO2 29  29 35* 37*  --   --  33* 36* 35* 31  GLUCOSE 114*  116* 147* 122*  --   --  130* 120* 104* 89  BUN 111*  111* 93* 76*  --   --  65* 56* 48* 41*  CREATININE 3.63*  3.69* 3.24* 2.81*  --   --  2.74* 2.69* 2.89* 2.86*  CALCIUM 8.7*  8.7* 9.1 8.6*  --   --  8.6* 8.7* 8.9 9.0  MG 2.2  --   --   < > 1.8 1.9 1.8 1.8 1.8  PHOS 7.1*  7.1* 5.3* 5.3*  --   --  4.7* 5.4*  --   --   < > = values in this interval not displayed. Liver Function Tests:  Recent Labs Lab 06/17/16 0500 06/18/16 0516 06/19/16 0420 06/20/16 0436 06/21/16 0442  AST  --  14* 14* 16 18  ALT  --  16 14 15 18   ALKPHOS  --  83 85 88 95  BILITOT  --  1.1 0.6 0.7 0.9  PROT  --  6.2* 5.9* 5.9* 6.2*  ALBUMIN 2.3* 2.5* 2.5* 2.7* 2.9*   No results for input(s): LIPASE, AMYLASE in the last 168 hours. No results for input(s): AMMONIA in the last 168 hours. CBC:  Recent Labs Lab 06/16/16 0445 06/18/16 0516 06/19/16 0420 06/20/16 0436 06/21/16 0442  WBC 12.9* 11.8* 10.3 8.7 8.0  NEUTROABS  --  9.1* 7.6 5.5  --   HGB 8.7* 8.5* 8.6* 8.7* 8.9*  HCT 27.6* 26.7* 26.2* 26.7* 26.9*  MCV 86.5 85.6 84.5 84.2 85.1  PLT 190 249 263 267 264   Cardiac Enzymes: No results for input(s): CKTOTAL, CKMB, CKMBINDEX, TROPONINI in the last 168 hours. BNP: Invalid input(s): POCBNP CBG:  Recent Labs Lab 06/20/16 1710 06/20/16 1945 06/21/16 0146 06/21/16 0453 06/21/16 0805  GLUCAP 110* 104* 81 88 86   D-Dimer No results for input(s): DDIMER in the last 72 hours. Hgb A1c No results for input(s): HGBA1C in the last 72 hours. Lipid Profile No results for  input(s): CHOL, HDL, LDLCALC, TRIG, CHOLHDL, LDLDIRECT in the last 72 hours. Thyroid function studies No results for input(s): TSH, T4TOTAL, T3FREE, THYROIDAB in the last 72 hours.  Invalid input(s): FREET3 Anemia work up No results for input(s): VITAMINB12, FOLATE, FERRITIN, TIBC, IRON, RETICCTPCT in the last 72 hours. Urinalysis    Component Value Date/Time   COLORURINE YELLOW 06/07/2016 1223   APPEARANCEUR CLOUDY (A) 06/07/2016 1223   LABSPEC >1.030 (H) 06/07/2016  [place] 5.5 06/07/2016 [place] 06/07/2016 1223   HGBUR LARGE (A) 06/07/2016 [place] 06/07/2016 1223   BILIRUBINUR neg 02/08/2014 1955   KETONESUR NEGATIVE 06/07/2016 1223   PROTEINUR 30 (A) 06/07/2016 1223   UROBILINOGEN 0.2 02/08/2014 1955   UROBILINOGEN 2.0 (H) 02/06/2009 1620   NITRITE NEGATIVE 06/07/2016 1223   LEUKOCYTESUR TRACE (A) 06/07/2016 1223   Sepsis Labs Invalid input(s): PROCALCITONIN,  WBC,  LACTICIDVEN Microbiology No results found for this or any previous visit (from the past 240 hour(s)).   Time coordinating discharge: Over 30 minutes  SIGNED:   Birdie Hopes, MD  [facility] 06/21/2016, 11:11 AM Pager   If 7PM-7AM, please contact night-coverage [email or web address] Password [email or web address]

## 2016-06-21 NOTE — Clinical Social Work Placement (Signed)
   CLINICAL SOCIAL WORK PLACEMENT  NOTE  Date:  06/21/2016  Patient Details  Name: Latoya [name] MRN: [id number] Date of Birth: 02/18/1946  Clinical Social Work is seeking post-discharge placement for this patient at the [place] level of care (*CSW will initial, date and re-position this form in  chart as items are completed):  Yes   Patient/family provided with [place] Work Department's list of facilities offering this level of care within the geographic area requested by the patient (or if unable, by the patient's family).  Yes   Patient/family informed of their freedom to choose among providers that offer the needed level of care, that participate in Medicare, Medicaid or managed care program needed by the patient, have an available bed and are willing to accept the patient.  Yes   Patient/family informed of [facility]'s ownership interest in [facility] and [facility], as well as of the fact that they are under no obligation to receive care at these facilities.  PASRR submitted to EDS on 06/21/16     PASRR number received on 06/21/16     Existing PASRR number confirmed on       FL2 transmitted to all facilities in geographic area requested by pt/family on 06/21/16     FL2 transmitted to all facilities within larger geographic area on       Patient informed that his/her managed care company has contracts with or will negotiate with certain facilities, including the following:        Yes   Patient/family informed of bed offers received.  Patient chooses bed at [place], [place]     Physician recommends and patient chooses bed at      Patient to be transferred to [place] on 06/21/16.  Patient to be transferred to facility by ambulance     Patient family notified on 06/21/16 of transfer.  Name of family member notified:  [place] Please prepare priority discharge summary, including  medications, Please prepare prescriptions, Please sign FL2, Please sign DNR     Additional Comment:  Per MD patient is ready to discharge to [place], [place]. RN, patient, patient's family, and facility notified of discharge. RN given phone number for report and transport packet is on patient's chart. Ambulance transport requested. CSW signing off.   _______________________________________________ Samule Dry, LCSW 06/21/2016, 3:17 PM

## 2016-06-21 NOTE — Care Management Important Message (Signed)
Important Message  Patient Details  Name: Latoya [name] MRN: [id number] Date of Birth: 25-Feb-1946   Medicare Important Message Given:  Yes    Nathen May 06/21/2016, 10:49 AM

## 2016-06-21 NOTE — Progress Notes (Addendum)
Report called to the nurse at [facility]. Patient in a stable condition,patient belongings at bedside, iv removed tele dc ccmd notified, patient taken off the off the unit by the EMS

## 2016-06-24 DIAGNOSIS — R070 Pain in throat: Secondary | ICD-10-CM | POA: Diagnosis not present

## 2016-06-24 DIAGNOSIS — K830 Cholangitis: Secondary | ICD-10-CM | POA: Diagnosis not present

## 2016-06-24 DIAGNOSIS — D6489 Other specified anemias: Secondary | ICD-10-CM | POA: Diagnosis not present

## 2016-06-24 DIAGNOSIS — R1013 Epigastric pain: Secondary | ICD-10-CM | POA: Diagnosis not present

## 2016-06-24 DIAGNOSIS — R110 Nausea: Secondary | ICD-10-CM | POA: Diagnosis not present

## 2016-06-24 DIAGNOSIS — I480 Paroxysmal atrial fibrillation: Secondary | ICD-10-CM | POA: Diagnosis not present

## 2016-06-24 DIAGNOSIS — M545 Low back pain: Secondary | ICD-10-CM | POA: Diagnosis not present

## 2016-06-24 DIAGNOSIS — E038 Other specified hypothyroidism: Secondary | ICD-10-CM | POA: Diagnosis not present

## 2016-06-28 ENCOUNTER — Other Ambulatory Visit: Payer: Self-pay | Admitting: General Surgery

## 2016-06-28 ENCOUNTER — Other Ambulatory Visit (HOSPITAL_COMMUNITY): Payer: Self-pay | Admitting: General Surgery

## 2016-06-28 DIAGNOSIS — K8309 Other cholangitis: Secondary | ICD-10-CM

## 2016-07-13 DIAGNOSIS — K819 Cholecystitis, unspecified: Secondary | ICD-10-CM | POA: Diagnosis not present

## 2016-07-17 ENCOUNTER — Ambulatory Visit
Admission: RE | Admit: 2016-07-17 | Discharge: 2016-07-17 | Disposition: A | Discharge status: Home / Self Care | Payer: Medicare Other | Source: Ambulatory Visit | Attending: General Surgery | Admitting: General Surgery

## 2016-07-17 DIAGNOSIS — K8309 Other cholangitis: Secondary | ICD-10-CM

## 2016-07-17 DIAGNOSIS — K810 Acute cholecystitis: Secondary | ICD-10-CM | POA: Diagnosis not present

## 2016-07-17 DIAGNOSIS — K830 Cholangitis: Secondary | ICD-10-CM | POA: Diagnosis not present

## 2016-07-17 HISTORY — PX: IR GENERIC HISTORICAL: IMG1180011

## 2016-07-17 NOTE — Progress Notes (Signed)
Referring Physician(s): Dr Erroll Luna  Chief Complaint: The patient is seen in follow up today s/p  8/14: Successful ultrasound percutaneous transhepatic cholecystostomy.  History of present illness:  Returns today for percutaneous cholecystostomy drain injection and evaluation. Pt denies N/V/D Denies fevers; chills Eating well Was sen by Dr Brantley Stage Ludwig Clarks 9/22  Not flushing drain Output bilious: approx 50 cc daily   Past Medical History:  Diagnosis Date  . Anemia, iron deficiency   . Antiphospholipid syndrome (HCC)    with hypercoagulable state  . Asthma    extrinsic; moderate, persistant, nml spirometry 2010, nml CXR 1/08  . Bladder troubles    REPORTS INFECTIONS ON OCCASION DUE TO URETHRA MEATUS STRICTURE AT BIRTH   . Blood dyscrasia    antiphospholipid disorder  . CAD (coronary artery disease)    50% mid LAD, 80% ostial D1 and moderate 80% mid circ by vath 2003  . Cancer ([place])    basal cell removed fr. L arm   . Complication of anesthesia    cardiac arrest- in OR, at age 54 (38)y.o. during Scalenotomy in her early 20's  . Coronary heart disease   . Drug allergy    heparin/lovenox  . DVT (deep venous thrombosis) ([place])   . Erosive gastritis 1994  . Family history of adverse reaction to anesthesia    some family members have had trouble waking up  . GERD (gastroesophageal reflux disease)   . H/O hiatal hernia   . HLD (hyperlipidemia)   . HTN (hypertension)    [name] at [facility], manages pt.  LOV 03/2014  . Hypothyroidism   . Liver spot    cyst- - no biopsy, but told that its benign   . PAF (paroxysmal atrial fibrillation) (HCC)    not on coumadin therapy  . Pulmonary embolism ([place])    related to back surgery with prior coumadin use, now off  . Spondylosis, lumbosacral    ARTHRITIS- OA     Past Surgical History:  Procedure Laterality Date  . ABDOMINAL HYSTERECTOMY     partial abdominal -1998  . BACK SURGERY     x13 cervical and lumbar thoracic  spine surgery  . BREAST ENHANCEMENT SURGERY    . BREAST SURGERY     all benign cysts x3   . CARDIAC CATHETERIZATION     2005  . cleft lip and palate repair     70 yo   . hysterectomy    . IR GENERIC HISTORICAL  06/04/2016   IR PERC CHOLECYSTOSTOMY 06/04/2016 Greggory Keen, MD [facility]  . JOINT REPLACEMENT    . POSTERIOR CERVICAL FUSION/FORAMINOTOMY Right 08/31/2014   Procedure: Right cervical six-seven foraminotomy, Cervical five-Thoracic one fusion, Cervical six-seven lateral mass screws;  Surgeon: Erline Levine, MD;  Location: [facility];  Service: Neurosurgery;  Laterality: Right;  . spina bifida repair     70 yo   . TONSILLECTOMY    . TOTAL KNEE ARTHROPLASTY     left    Allergies: Hornet venom; Pork-derived products; Chlorhexidine; Enoxaparin sodium; Heparin; Lactose intolerance (gi); Other; Povidone-iodine; Indomethacin; Phenylpropanolamine; and Povidone iodine  Medications: Prior to Admission medications   Medication Sig Start Date End Date Taking? Authorizing Provider  albuterol (PROVENTIL HFA;VENTOLIN HFA) 108 (90 BASE) MCG/ACT inhaler Inhale 2 puffs into the lungs every 6 (six) hours as needed for wheezing or shortness of breath.     Historical Provider, MD  amiodarone (PACERONE) 200 MG tablet Take 1 tablet (200 mg total) by mouth daily. 06/21/16  Verlee Monte, MD  aspirin 81 MG EC tablet Take 81 mg by mouth daily.     Historical Provider, MD  atorvastatin (LIPITOR) 20 MG tablet Take 1 tablet (20 mg total) by mouth daily. Patient not taking: Reported on 06/04/2016 11/09/15   Burtis Junes, NP  atorvastatin (LIPITOR) 40 MG tablet Take 40 mg by mouth daily.    Historical Provider, MD  azelastine (ASTELIN) 137 MCG/SPRAY nasal spray Place 1 spray into the nose 2 (two) times daily. Use in each nostril as directed For allergies    Historical Provider, MD  beclomethasone (QVAR) 80 MCG/ACT inhaler Inhale 2 puffs into the lungs 2 (two) times daily. Patient taking differently:  Inhale 2 puffs into the lungs 3 (three) times daily.  06/24/14   Elsie Stain, MD  Calcium Carb-Cholecalciferol (CALCIUM 500 +D) 500-400 MG-UNIT TABS Take 1 tablet by mouth 2 (two) times daily.    Historical Provider, MD  docusate sodium (COLACE) 100 MG capsule Take 200 mg by mouth 2 (two) times daily.     Historical Provider, MD  EPINEPHrine (EPIPEN) 0.3 mg/0.3 mL DEVI Inject 0.3 mLs (0.3 mg total) into the muscle once. Patient taking differently: Inject 0.3 mg into the muscle once as needed.  04/12/13   [name] Rama, MD  esomeprazole (NEXIUM) 40 MG capsule Take 40 mg by mouth 2 (two) times daily.     Historical Provider, MD  FentaNYL 37.5 MCG/HR PT72 Place 1 patch onto the skin every 3 (three) days. 06/21/16   Verlee Monte, MD  Glucosamine-Chondroitin 750-600 MG TABS Take 1 tablet by mouth 2 (two) times daily.    Historical Provider, MD  HYDROmorphone (DILAUDID) 4 MG tablet Take 1 tablet (4 mg total) by mouth every 4 (four) hours as needed. For pain 06/21/16   Verlee Monte, MD  levothyroxine (SYNTHROID, LEVOTHROID) 75 MCG tablet Take 75 mcg by mouth daily before breakfast.     Historical Provider, MD  lidocaine (LIDODERM) 5 % Place 1 patch onto the skin daily. Remove & Discard patch within 12 hours or as directed by MD    Historical Provider, MD  Loratadine-Pseudoephedrine (CLARITIN-D 12 HOUR PO) Take 1 tablet by mouth 2 (two) times daily.     Historical Provider, MD  Melatonin 2.5 MG CAPS Take 2.5-5 mg by mouth at bedtime.    Historical Provider, MD  metaxalone (SKELAXIN) 800 MG tablet Take 800 mg by mouth every 8 (eight) hours as needed. For muscle pain    Historical Provider, MD  metoprolol (LOPRESSOR) 50 MG tablet Take 1 tablet (50 mg total) by mouth 2 (two) times daily. 11/09/15   Burtis Junes, NP  montelukast (SINGULAIR) 10 MG tablet Take 10 mg by mouth at bedtime.     Historical Provider, MD  Multiple Vitamin (MULTIVITAMIN WITH MINERALS) TABS Take 1 tablet by mouth daily.    Historical  Provider, MD  NITROSTAT 0.4 MG SL tablet DISSOLVE 1 TABLET UNDER TONGUE AS NEEDED FOR CHEST PAIN,MAY REPEAT IN5 MINUTES FOR 2 DOSES. 02/24/16   Burnell Blanks, MD  Respiratory Therapy Supplies (FLUTTER) DEVI Use as directed 06/06/15   Tanda Rockers, MD  Rivaroxaban (XARELTO) 15 MG TABS tablet Take 1 tablet (15 mg total) by mouth daily with supper. 06/21/16   Verlee Monte, MD  triamcinolone (NASACORT) 55 MCG/ACT nasal inhaler Place 1-2 sprays into the nose 2 (two) times daily.     Historical Provider, MD     Family History  Problem Relation Age of Onset  .  Heart disease Father     MI   . Heart disease Sister   .       Social History   Social History  . Marital status: Married    Spouse name: N/A  . Number of children: N/A  . Years of education: N/A   Social History Main Topics  . Smoking status: Never Smoker  . Smokeless tobacco: Never Used     Comment: no smoking   . Alcohol use No  . Drug use: No  . Sexual activity: Not on file   Other Topics Concern  . Not on file   Social History Narrative   Lives in [place] with husband; has had miscarriages but no children.    Disabled but exercises nearly every day (can only exericse in water - aerobics)   Takes opioids for pain relief; takes no herbal medications; has a heart healthy diet.      Vital Signs: BP: 144/73; 02 sat: 96% RA; P: 88; Temp: 97.7  Physical Exam  Abdominal: Soft. Bowel sounds are normal.  Skin: Skin is warm and dry.  Site of chole drain is clean and dry; NT No sign of infection No bleeding  Nursing note and vitals reviewed.  Drain injection does show patency of common bile duct --empties to duodenum   Imaging: No results found.  Labs:  CBC:  Recent Labs  06/18/16 0516 06/19/16 0420 06/20/16 0436 06/21/16 0442  WBC 11.8* 10.3 8.7 8.0  HGB 8.5* 8.6* 8.7* 8.9*  HCT 26.7* 26.2* 26.7* 26.9*  PLT 249 263 267 264    COAGS:  Recent Labs  06/04/16 1314  06/17/16 1535 06/18/16 0516  06/19/16 0420 06/20/16 0436  INR 1.58  --   --   --   --   --   APTT  --   < > 70* 73* 74* 78*  < > = values in this interval not displayed.  BMP:  Recent Labs  06/18/16 0516 06/19/16 0420 06/20/16 0436 06/21/16 0442  NA 136 138 137 141  K 3.1* 3.0* 3.2* 3.2*  CL 91* 91* 91* 99*  CO2 33* 36* 35* 31  GLUCOSE 130* 120* 104* 89  BUN 65* 56* 48* 41*  CALCIUM 8.6* 8.7* 8.9 9.0  CREATININE 2.74* 2.69* 2.89* 2.86*  GFRNONAA 17* 17* 16* 16*  GFRAA 19* 20* 18* 18*    LIVER FUNCTION TESTS:  Recent Labs  06/18/16 0516 06/19/16 0420 06/20/16 0436 06/21/16 0442  BILITOT 1.1 0.6 0.7 0.9  AST 14* 14* 16 18  ALT 16 14 15 18   ALKPHOS 83 85 88 95  PROT 6.2* 5.9* 5.9* 6.2*  ALBUMIN 2.5* 2.5* 2.7* 2.9*    Assessment:  Percutaneous cholecystostomy drain was placed 06/04/16 Injection reveals CBD patency and empties to duodenum Dr Earleen Newport has seen and spoken to pt. Plan: pt to follow up with Dr Brantley Stage as to next plan She has good understanding of this plan.   SignedMonia Sabal A 07/17/2016, 1:31 PM   Please refer to Dr. Earleen Newport attestation of this note for management and plan.

## 2016-07-20 ENCOUNTER — Other Ambulatory Visit: Payer: Self-pay | Admitting: Surgery

## 2016-07-20 DIAGNOSIS — K819 Cholecystitis, unspecified: Secondary | ICD-10-CM

## 2016-07-23 ENCOUNTER — Telehealth: Payer: Self-pay | Admitting: Internal Medicine

## 2016-07-23 NOTE — Telephone Encounter (Signed)
That is fine- I actually know her from [facility].  We can try to see her sooner

## 2016-07-23 NOTE — Telephone Encounter (Signed)
Caller name: Yassin,John H Relation to SG:[phone]  Call back number:[phone]  : Reason for call:  spouse scheduled  new patient appointment for 09/05/16 and would like to be seen sooner due to being discharged from rehab. Informed spouse of waiting list but was adamant about me asking the MD. Please advise

## 2016-07-24 NOTE — Telephone Encounter (Signed)
Spouse was extremely grateful appointment scheduled for 08/13/2016 follow up 30 minute slot.

## 2016-07-25 ENCOUNTER — Ambulatory Visit
Admission: RE | Admit: 2016-07-25 | Discharge: 2016-07-25 | Disposition: A | Discharge status: Home / Self Care | Payer: Medicare Other | Source: Ambulatory Visit | Attending: Surgery | Admitting: Surgery

## 2016-07-25 DIAGNOSIS — K819 Cholecystitis, unspecified: Secondary | ICD-10-CM

## 2016-07-25 DIAGNOSIS — I110 Hypertensive heart disease with heart failure: Secondary | ICD-10-CM | POA: Diagnosis not present

## 2016-07-25 DIAGNOSIS — Z7901 Long term (current) use of anticoagulants: Secondary | ICD-10-CM | POA: Diagnosis not present

## 2016-07-25 DIAGNOSIS — I2510 Atherosclerotic heart disease of native coronary artery without angina pectoris: Secondary | ICD-10-CM | POA: Diagnosis not present

## 2016-07-25 DIAGNOSIS — Z7982 Long term (current) use of aspirin: Secondary | ICD-10-CM | POA: Diagnosis not present

## 2016-07-25 DIAGNOSIS — I480 Paroxysmal atrial fibrillation: Secondary | ICD-10-CM | POA: Diagnosis not present

## 2016-07-25 DIAGNOSIS — I5042 Chronic combined systolic (congestive) and diastolic (congestive) heart failure: Secondary | ICD-10-CM | POA: Diagnosis not present

## 2016-07-31 DIAGNOSIS — I5042 Chronic combined systolic (congestive) and diastolic (congestive) heart failure: Secondary | ICD-10-CM | POA: Diagnosis not present

## 2016-07-31 DIAGNOSIS — I480 Paroxysmal atrial fibrillation: Secondary | ICD-10-CM | POA: Diagnosis not present

## 2016-07-31 DIAGNOSIS — I10 Essential (primary) hypertension: Secondary | ICD-10-CM | POA: Diagnosis not present

## 2016-07-31 DIAGNOSIS — I2510 Atherosclerotic heart disease of native coronary artery without angina pectoris: Secondary | ICD-10-CM | POA: Diagnosis not present

## 2016-08-02 ENCOUNTER — Other Ambulatory Visit: Payer: Medicare Other

## 2016-08-03 ENCOUNTER — Encounter (HOSPITAL_COMMUNITY): Payer: Self-pay | Admitting: Emergency Medicine

## 2016-08-03 ENCOUNTER — Emergency Department (HOSPITAL_COMMUNITY): Payer: Medicare Other

## 2016-08-03 DIAGNOSIS — E739 Lactose intolerance, unspecified: Secondary | ICD-10-CM | POA: Diagnosis present

## 2016-08-03 DIAGNOSIS — K830 Cholangitis: Secondary | ICD-10-CM | POA: Diagnosis present

## 2016-08-03 DIAGNOSIS — Z86711 Personal history of pulmonary embolism: Secondary | ICD-10-CM

## 2016-08-03 DIAGNOSIS — J45909 Unspecified asthma, uncomplicated: Secondary | ICD-10-CM | POA: Diagnosis present

## 2016-08-03 DIAGNOSIS — R8271 Bacteriuria: Secondary | ICD-10-CM | POA: Diagnosis not present

## 2016-08-03 DIAGNOSIS — R112 Nausea with vomiting, unspecified: Secondary | ICD-10-CM | POA: Diagnosis present

## 2016-08-03 DIAGNOSIS — Z981 Arthrodesis status: Secondary | ICD-10-CM

## 2016-08-03 DIAGNOSIS — J189 Pneumonia, unspecified organism: Secondary | ICD-10-CM | POA: Diagnosis not present

## 2016-08-03 DIAGNOSIS — R0781 Pleurodynia: Secondary | ICD-10-CM | POA: Diagnosis not present

## 2016-08-03 DIAGNOSIS — I739 Peripheral vascular disease, unspecified: Secondary | ICD-10-CM | POA: Diagnosis present

## 2016-08-03 DIAGNOSIS — I10 Essential (primary) hypertension: Secondary | ICD-10-CM | POA: Diagnosis present

## 2016-08-03 DIAGNOSIS — Z96652 Presence of left artificial knee joint: Secondary | ICD-10-CM | POA: Diagnosis present

## 2016-08-03 DIAGNOSIS — I480 Paroxysmal atrial fibrillation: Secondary | ICD-10-CM | POA: Diagnosis not present

## 2016-08-03 DIAGNOSIS — E785 Hyperlipidemia, unspecified: Secondary | ICD-10-CM | POA: Diagnosis present

## 2016-08-03 DIAGNOSIS — E039 Hypothyroidism, unspecified: Secondary | ICD-10-CM | POA: Diagnosis present

## 2016-08-03 DIAGNOSIS — R51 Headache: Secondary | ICD-10-CM | POA: Diagnosis present

## 2016-08-03 DIAGNOSIS — I2510 Atherosclerotic heart disease of native coronary artery without angina pectoris: Secondary | ICD-10-CM | POA: Diagnosis present

## 2016-08-03 DIAGNOSIS — R0602 Shortness of breath: Secondary | ICD-10-CM | POA: Diagnosis not present

## 2016-08-03 DIAGNOSIS — D6861 Antiphospholipid syndrome: Secondary | ICD-10-CM | POA: Diagnosis not present

## 2016-08-03 DIAGNOSIS — K219 Gastro-esophageal reflux disease without esophagitis: Secondary | ICD-10-CM | POA: Diagnosis present

## 2016-08-03 DIAGNOSIS — D72829 Elevated white blood cell count, unspecified: Secondary | ICD-10-CM | POA: Diagnosis present

## 2016-08-03 DIAGNOSIS — D638 Anemia in other chronic diseases classified elsewhere: Secondary | ICD-10-CM | POA: Diagnosis present

## 2016-08-03 DIAGNOSIS — Z888 Allergy status to other drugs, medicaments and biological substances status: Secondary | ICD-10-CM

## 2016-08-03 DIAGNOSIS — Z79899 Other long term (current) drug therapy: Secondary | ICD-10-CM

## 2016-08-03 DIAGNOSIS — Z87730 Personal history of (corrected) cleft lip and palate: Secondary | ICD-10-CM

## 2016-08-03 DIAGNOSIS — R1011 Right upper quadrant pain: Secondary | ICD-10-CM | POA: Diagnosis present

## 2016-08-03 DIAGNOSIS — Z7901 Long term (current) use of anticoagulants: Secondary | ICD-10-CM

## 2016-08-03 DIAGNOSIS — Z8249 Family history of ischemic heart disease and other diseases of the circulatory system: Secondary | ICD-10-CM

## 2016-08-03 LAB — COMPREHENSIVE METABOLIC PANEL
ALBUMIN: 4.4 g/dL (ref 3.5–5.0)
ALT: 13 U/L — ABNORMAL LOW (ref 14–54)
ANION GAP: 12 (ref 5–15)
AST: 23 U/L (ref 15–41)
Alkaline Phosphatase: 81 U/L (ref 38–126)
BILIRUBIN TOTAL: 0.6 mg/dL (ref 0.3–1.2)
BUN: 12 mg/dL (ref 6–20)
CHLORIDE: 99 mmol/L — AB (ref 101–111)
CO2: 24 mmol/L (ref 22–32)
Calcium: 9.8 mg/dL (ref 8.9–10.3)
Creatinine, Ser: 0.78 mg/dL (ref 0.44–1.00)
GFR calc Af Amer: >60 mL/min (ref >60)
GFR calc non Af Amer: >60 mL/min (ref >60)
GLUCOSE: 123 mg/dL — AB (ref 65–99)
POTASSIUM: 4.1 mmol/L (ref 3.5–5.1)
SODIUM: 135 mmol/L (ref 135–145)
Total Protein: 7.1 g/dL (ref 6.5–8.1)

## 2016-08-03 LAB — CBC WITH DIFFERENTIAL/PLATELET
BASOS ABS: 0 10*3/uL (ref 0.0–0.1)
BASOS PCT: 0 %
EOS ABS: 0.1 10*3/uL (ref 0.0–0.7)
Eosinophils Relative: 1 %
HCT: 34.4 % — ABNORMAL LOW (ref 36.0–46.0)
Hemoglobin: 11.2 g/dL — ABNORMAL LOW (ref 12.0–15.0)
Lymphocytes Relative: 5 %
Lymphs Abs: 0.8 10*3/uL (ref 0.7–4.0)
MCH: 29 pg (ref 26.0–34.0)
MCHC: 32.6 g/dL (ref 30.0–36.0)
MCV: 89.1 fL (ref 78.0–100.0)
MONO ABS: 0.6 10*3/uL (ref 0.1–1.0)
MONOS PCT: 3 %
NEUTROS ABS: 14.9 10*3/uL — AB (ref 1.7–7.7)
Neutrophils Relative %: 91 %
PLATELETS: 197 10*3/uL (ref 150–400)
RBC: 3.86 MIL/uL — ABNORMAL LOW (ref 3.87–5.11)
RDW: 14.8 % (ref 11.5–15.5)
WBC: 16.4 10*3/uL — ABNORMAL HIGH (ref 4.0–10.5)

## 2016-08-03 LAB — I-STAT TROPONIN, ED: Troponin i, poc: 0 ng/mL (ref 0.00–0.08)

## 2016-08-03 LAB — LIPASE, BLOOD: LIPASE: 24 U/L (ref 11–51)

## 2016-08-03 NOTE — ED Triage Notes (Signed)
Pt. reports right ribcage pain with mild SOB and multiple emesis and chills onset this evening , denies fever or chills.

## 2016-08-03 NOTE — ED Notes (Signed)
Delay in lab draw,  Pt in exray

## 2016-08-04 ENCOUNTER — Inpatient Hospital Stay (HOSPITAL_COMMUNITY)
Admission: EM | Admit: 2016-08-04 | Discharge: 2016-08-07 | DRG: 204 | Disposition: A | Discharge status: Home Health | Payer: Medicare Other | Attending: Internal Medicine | Admitting: Internal Medicine

## 2016-08-04 ENCOUNTER — Encounter (HOSPITAL_COMMUNITY): Payer: Self-pay | Admitting: Emergency Medicine

## 2016-08-04 DIAGNOSIS — Z8249 Family history of ischemic heart disease and other diseases of the circulatory system: Secondary | ICD-10-CM | POA: Diagnosis not present

## 2016-08-04 DIAGNOSIS — D6859 Other primary thrombophilia: Secondary | ICD-10-CM | POA: Diagnosis present

## 2016-08-04 DIAGNOSIS — D6861 Antiphospholipid syndrome: Secondary | ICD-10-CM | POA: Diagnosis present

## 2016-08-04 DIAGNOSIS — R7881 Bacteremia: Secondary | ICD-10-CM | POA: Diagnosis not present

## 2016-08-04 DIAGNOSIS — R0781 Pleurodynia: Secondary | ICD-10-CM | POA: Diagnosis present

## 2016-08-04 DIAGNOSIS — E739 Lactose intolerance, unspecified: Secondary | ICD-10-CM | POA: Diagnosis present

## 2016-08-04 DIAGNOSIS — Z7901 Long term (current) use of anticoagulants: Secondary | ICD-10-CM | POA: Diagnosis not present

## 2016-08-04 DIAGNOSIS — Z86711 Personal history of pulmonary embolism: Secondary | ICD-10-CM | POA: Diagnosis not present

## 2016-08-04 DIAGNOSIS — I2510 Atherosclerotic heart disease of native coronary artery without angina pectoris: Secondary | ICD-10-CM | POA: Diagnosis not present

## 2016-08-04 DIAGNOSIS — E039 Hypothyroidism, unspecified: Secondary | ICD-10-CM | POA: Diagnosis not present

## 2016-08-04 DIAGNOSIS — K219 Gastro-esophageal reflux disease without esophagitis: Secondary | ICD-10-CM | POA: Diagnosis present

## 2016-08-04 DIAGNOSIS — I10 Essential (primary) hypertension: Secondary | ICD-10-CM

## 2016-08-04 DIAGNOSIS — R112 Nausea with vomiting, unspecified: Secondary | ICD-10-CM | POA: Diagnosis not present

## 2016-08-04 DIAGNOSIS — Z87730 Personal history of (corrected) cleft lip and palate: Secondary | ICD-10-CM | POA: Diagnosis not present

## 2016-08-04 DIAGNOSIS — K830 Cholangitis: Secondary | ICD-10-CM | POA: Diagnosis present

## 2016-08-04 DIAGNOSIS — R1011 Right upper quadrant pain: Secondary | ICD-10-CM | POA: Diagnosis present

## 2016-08-04 DIAGNOSIS — D638 Anemia in other chronic diseases classified elsewhere: Secondary | ICD-10-CM | POA: Diagnosis not present

## 2016-08-04 DIAGNOSIS — R8271 Bacteriuria: Secondary | ICD-10-CM | POA: Diagnosis present

## 2016-08-04 DIAGNOSIS — Z981 Arthrodesis status: Secondary | ICD-10-CM | POA: Diagnosis not present

## 2016-08-04 DIAGNOSIS — J45909 Unspecified asthma, uncomplicated: Secondary | ICD-10-CM | POA: Diagnosis present

## 2016-08-04 DIAGNOSIS — Z96652 Presence of left artificial knee joint: Secondary | ICD-10-CM | POA: Diagnosis present

## 2016-08-04 DIAGNOSIS — J189 Pneumonia, unspecified organism: Secondary | ICD-10-CM | POA: Diagnosis not present

## 2016-08-04 DIAGNOSIS — D72829 Elevated white blood cell count, unspecified: Secondary | ICD-10-CM | POA: Diagnosis not present

## 2016-08-04 DIAGNOSIS — R51 Headache: Secondary | ICD-10-CM | POA: Diagnosis present

## 2016-08-04 DIAGNOSIS — K8000 Calculus of gallbladder with acute cholecystitis without obstruction: Secondary | ICD-10-CM | POA: Diagnosis not present

## 2016-08-04 DIAGNOSIS — I480 Paroxysmal atrial fibrillation: Secondary | ICD-10-CM

## 2016-08-04 DIAGNOSIS — I739 Peripheral vascular disease, unspecified: Secondary | ICD-10-CM | POA: Diagnosis present

## 2016-08-04 DIAGNOSIS — I4891 Unspecified atrial fibrillation: Secondary | ICD-10-CM | POA: Diagnosis present

## 2016-08-04 DIAGNOSIS — E785 Hyperlipidemia, unspecified: Secondary | ICD-10-CM | POA: Diagnosis present

## 2016-08-04 LAB — CBC WITH DIFFERENTIAL/PLATELET
BASOS ABS: 0 10*3/uL (ref 0.0–0.1)
BASOS PCT: 0 %
EOS ABS: 0 10*3/uL (ref 0.0–0.7)
Eosinophils Relative: 0 %
HEMATOCRIT: 28.6 % — AB (ref 36.0–46.0)
HEMOGLOBIN: 9.5 g/dL — AB (ref 12.0–15.0)
Lymphocytes Relative: 6 %
Lymphs Abs: 1.2 10*3/uL (ref 0.7–4.0)
MCH: 29 pg (ref 26.0–34.0)
MCHC: 33.2 g/dL (ref 30.0–36.0)
MCV: 87.2 fL (ref 78.0–100.0)
Monocytes Absolute: 1.1 10*3/uL — ABNORMAL HIGH (ref 0.1–1.0)
Monocytes Relative: 6 %
NEUTROS PCT: 88 %
Neutro Abs: 17.6 10*3/uL — ABNORMAL HIGH (ref 1.7–7.7)
Platelets: 165 10*3/uL (ref 150–400)
RBC: 3.28 MIL/uL — AB (ref 3.87–5.11)
RDW: 14.7 % (ref 11.5–15.5)
WBC: 19.9 10*3/uL — AB (ref 4.0–10.5)

## 2016-08-04 LAB — BASIC METABOLIC PANEL
ANION GAP: 10 (ref 5–15)
BUN: 11 mg/dL (ref 6–20)
CALCIUM: 9 mg/dL (ref 8.9–10.3)
CHLORIDE: 102 mmol/L (ref 101–111)
CO2: 23 mmol/L (ref 22–32)
CREATININE: 0.69 mg/dL (ref 0.44–1.00)
GFR calc non Af Amer: >60 mL/min (ref >60)
Glucose, Bld: 108 mg/dL — ABNORMAL HIGH (ref 65–99)
Potassium: 3.8 mmol/L (ref 3.5–5.1)
SODIUM: 135 mmol/L (ref 135–145)

## 2016-08-04 LAB — URINE MICROSCOPIC-ADD ON

## 2016-08-04 LAB — HEPATIC FUNCTION PANEL
ALBUMIN: 3.5 g/dL (ref 3.5–5.0)
ALT: 14 U/L (ref 14–54)
AST: 18 U/L (ref 15–41)
Alkaline Phosphatase: 65 U/L (ref 38–126)
BILIRUBIN DIRECT: 0.1 mg/dL (ref 0.1–0.5)
BILIRUBIN TOTAL: 0.8 mg/dL (ref 0.3–1.2)
Indirect Bilirubin: 0.7 mg/dL (ref 0.3–0.9)
TOTAL PROTEIN: 5.8 g/dL — AB (ref 6.5–8.1)

## 2016-08-04 LAB — URINALYSIS, ROUTINE W REFLEX MICROSCOPIC
BILIRUBIN URINE: NEGATIVE
Glucose, UA: NEGATIVE mg/dL
Ketones, ur: NEGATIVE mg/dL
Nitrite: NEGATIVE
PH: 5 (ref 5.0–8.0)
Protein, ur: NEGATIVE mg/dL
SPECIFIC GRAVITY, URINE: 1.019 (ref 1.005–1.030)

## 2016-08-04 LAB — INFLUENZA PANEL BY PCR (TYPE A & B)
H1N1 flu by pcr: NOT DETECTED
INFLAPCR: NEGATIVE
Influenza B By PCR: NEGATIVE

## 2016-08-04 LAB — LACTIC ACID, PLASMA: LACTIC ACID, VENOUS: 1.4 mmol/L (ref 0.5–1.9)

## 2016-08-04 LAB — STREP PNEUMONIAE URINARY ANTIGEN: Strep Pneumo Urinary Antigen: NEGATIVE

## 2016-08-04 MED ORDER — ATORVASTATIN CALCIUM 10 MG PO TABS: 20.0000 mg | ORAL_TABLET | Freq: Every day | ORAL | Status: DC

## 2016-08-04 MED ORDER — PANTOPRAZOLE SODIUM 40 MG PO TBEC
40.0000 mg | DELAYED_RELEASE_TABLET | Freq: Every day | ORAL | Status: DC
  Administered 2016-08-04 – 2016-08-07 (×4): 40 mg via ORAL
  Filled 2016-08-04 (×4): qty 1

## 2016-08-04 MED ORDER — SODIUM CHLORIDE 0.9 % IV SOLN
INTRAVENOUS | Status: AC
  Administered 2016-08-04: 05:00:00 via INTRAVENOUS

## 2016-08-04 MED ORDER — ALBUTEROL SULFATE (2.5 MG/3ML) 0.083% IN NEBU: 2.5000 mg | INHALATION_SOLUTION | Freq: Four times a day (QID) | RESPIRATORY_TRACT | Status: DC | PRN

## 2016-08-04 MED ORDER — BECLOMETHASONE DIPROPIONATE 80 MCG/ACT IN AERS: 2.0000 | INHALATION_SPRAY | RESPIRATORY_TRACT | Status: DC

## 2016-08-04 MED ORDER — ONDANSETRON HCL 4 MG/2ML IJ SOLN: 4.0000 mg | Freq: Four times a day (QID) | INTRAMUSCULAR | Status: DC | PRN

## 2016-08-04 MED ORDER — ATORVASTATIN CALCIUM 40 MG PO TABS
40.0000 mg | ORAL_TABLET | Freq: Every day | ORAL | Status: DC
  Administered 2016-08-04 – 2016-08-07 (×4): 40 mg via ORAL
  Filled 2016-08-04 (×4): qty 1

## 2016-08-04 MED ORDER — NITROGLYCERIN 0.4 MG SL SUBL: 0.4000 mg | SUBLINGUAL_TABLET | SUBLINGUAL | Status: DC | PRN

## 2016-08-04 MED ORDER — ONDANSETRON HCL 4 MG/2ML IJ SOLN
4.0000 mg | Freq: Once | INTRAMUSCULAR | Status: AC
  Administered 2016-08-04: 4 mg via INTRAVENOUS
  Filled 2016-08-04: qty 2

## 2016-08-04 MED ORDER — CALCIUM CARBONATE-VITAMIN D 500-200 MG-UNIT PO TABS
1.0000 | ORAL_TABLET | Freq: Two times a day (BID) | ORAL | Status: DC
  Administered 2016-08-04 – 2016-08-07 (×7): 1 via ORAL
  Filled 2016-08-04 (×7): qty 1

## 2016-08-04 MED ORDER — LEVOTHYROXINE SODIUM 75 MCG PO TABS
75.0000 ug | ORAL_TABLET | Freq: Every day | ORAL | Status: DC
  Administered 2016-08-04 – 2016-08-07 (×4): 75 ug via ORAL
  Filled 2016-08-04 (×4): qty 1

## 2016-08-04 MED ORDER — METOPROLOL TARTRATE 25 MG PO TABS
50.0000 mg | ORAL_TABLET | Freq: Two times a day (BID) | ORAL | Status: DC
  Administered 2016-08-04 – 2016-08-07 (×7): 50 mg via ORAL
  Filled 2016-08-04 (×6): qty 2
  Filled 2016-08-04: qty 4

## 2016-08-04 MED ORDER — GABAPENTIN 300 MG PO CAPS
300.0000 mg | ORAL_CAPSULE | Freq: Every day | ORAL | Status: DC
  Administered 2016-08-05 – 2016-08-06 (×2): 300 mg via ORAL
  Filled 2016-08-04 (×2): qty 1

## 2016-08-04 MED ORDER — SODIUM CHLORIDE 0.9 % IV SOLN
INTRAVENOUS | Status: DC
  Administered 2016-08-04: 01:00:00 via INTRAVENOUS

## 2016-08-04 MED ORDER — FENTANYL 50 MCG/HR TD PT72
50.0000 ug | MEDICATED_PATCH | TRANSDERMAL | Status: DC
  Administered 2016-08-04 – 2016-08-07 (×2): 50 ug via TRANSDERMAL
  Filled 2016-08-04 (×2): qty 1

## 2016-08-04 MED ORDER — MONTELUKAST SODIUM 10 MG PO TABS
10.0000 mg | ORAL_TABLET | Freq: Every day | ORAL | Status: DC
  Administered 2016-08-04 – 2016-08-06 (×3): 10 mg via ORAL
  Filled 2016-08-04 (×3): qty 1

## 2016-08-04 MED ORDER — BUDESONIDE 0.5 MG/2ML IN SUSP
0.5000 mg | Freq: Two times a day (BID) | RESPIRATORY_TRACT | Status: DC
  Administered 2016-08-04 – 2016-08-07 (×7): 0.5 mg via RESPIRATORY_TRACT
  Filled 2016-08-04 (×7): qty 2

## 2016-08-04 MED ORDER — AZELASTINE HCL 0.1 % NA SOLN
1.0000 | Freq: Two times a day (BID) | NASAL | Status: DC
  Administered 2016-08-04 – 2016-08-07 (×5): 1 via NASAL
  Filled 2016-08-04 (×2): qty 30

## 2016-08-04 MED ORDER — VANCOMYCIN HCL IN DEXTROSE 1-5 GM/200ML-% IV SOLN
1000.0000 mg | Freq: Two times a day (BID) | INTRAVENOUS | Status: DC
  Administered 2016-08-04 – 2016-08-05 (×4): 1000 mg via INTRAVENOUS
  Filled 2016-08-04 (×7): qty 200

## 2016-08-04 MED ORDER — ADULT MULTIVITAMIN W/MINERALS CH
1.0000 | ORAL_TABLET | Freq: Every day | ORAL | Status: DC
  Administered 2016-08-04 – 2016-08-07 (×4): 1 via ORAL
  Filled 2016-08-04 (×4): qty 1

## 2016-08-04 MED ORDER — METAXALONE 800 MG PO TABS
800.0000 mg | ORAL_TABLET | Freq: Three times a day (TID) | ORAL | Status: DC | PRN
  Filled 2016-08-04: qty 1

## 2016-08-04 MED ORDER — ONDANSETRON HCL 4 MG PO TABS: 4.0000 mg | ORAL_TABLET | Freq: Four times a day (QID) | ORAL | Status: DC | PRN

## 2016-08-04 MED ORDER — ACETAMINOPHEN 325 MG PO TABS
650.0000 mg | ORAL_TABLET | Freq: Once | ORAL | Status: AC
  Administered 2016-08-04: 650 mg via ORAL
  Filled 2016-08-04: qty 2

## 2016-08-04 MED ORDER — RIVAROXABAN 20 MG PO TABS
20.0000 mg | ORAL_TABLET | Freq: Every day | ORAL | Status: DC
  Administered 2016-08-04 – 2016-08-07 (×4): 20 mg via ORAL
  Filled 2016-08-04 (×4): qty 1

## 2016-08-04 MED ORDER — DOCUSATE SODIUM 100 MG PO CAPS
200.0000 mg | ORAL_CAPSULE | Freq: Two times a day (BID) | ORAL | Status: DC
  Administered 2016-08-04 – 2016-08-07 (×7): 200 mg via ORAL
  Filled 2016-08-04 (×7): qty 2

## 2016-08-04 MED ORDER — ACETAMINOPHEN 650 MG RE SUPP: 650.0000 mg | Freq: Four times a day (QID) | RECTAL | Status: DC | PRN

## 2016-08-04 MED ORDER — ACETAMINOPHEN 325 MG PO TABS
650.0000 mg | ORAL_TABLET | Freq: Four times a day (QID) | ORAL | Status: DC | PRN
  Administered 2016-08-06: 650 mg via ORAL
  Filled 2016-08-04: qty 2

## 2016-08-04 MED ORDER — MAGIC MOUTHWASH: 1.0000 mL | Freq: Four times a day (QID) | ORAL | Status: DC | PRN

## 2016-08-04 MED ORDER — DEXTROSE 5 % IV SOLN
1.0000 g | Freq: Three times a day (TID) | INTRAVENOUS | Status: DC
  Administered 2016-08-04 – 2016-08-07 (×8): 1 g via INTRAVENOUS
  Filled 2016-08-04 (×11): qty 1

## 2016-08-04 MED ORDER — DIAZEPAM 5 MG PO TABS: 5.0000 mg | ORAL_TABLET | Freq: Every evening | ORAL | Status: DC | PRN

## 2016-08-04 MED ORDER — NYSTATIN 100000 UNIT/ML MT SUSP: 5.0000 mL | Freq: Four times a day (QID) | OROMUCOSAL | Status: DC | PRN

## 2016-08-04 MED ORDER — ASPIRIN EC 81 MG PO TBEC
81.0000 mg | DELAYED_RELEASE_TABLET | Freq: Every day | ORAL | Status: DC
  Administered 2016-08-04 – 2016-08-07 (×4): 81 mg via ORAL
  Filled 2016-08-04 (×4): qty 1

## 2016-08-04 MED ORDER — AMIODARONE HCL 200 MG PO TABS
200.0000 mg | ORAL_TABLET | Freq: Every day | ORAL | Status: DC
  Administered 2016-08-05 – 2016-08-07 (×3): 200 mg via ORAL
  Filled 2016-08-04 (×4): qty 1

## 2016-08-04 MED ORDER — PIPERACILLIN-TAZOBACTAM 3.375 G IVPB 30 MIN
3.3750 g | Freq: Once | INTRAVENOUS | Status: AC
  Administered 2016-08-04: 3.375 g via INTRAVENOUS
  Filled 2016-08-04: qty 50

## 2016-08-04 MED ORDER — VANCOMYCIN HCL IN DEXTROSE 1-5 GM/200ML-% IV SOLN
1000.0000 mg | Freq: Once | INTRAVENOUS | Status: AC
  Administered 2016-08-04: 1000 mg via INTRAVENOUS
  Filled 2016-08-04: qty 200

## 2016-08-04 NOTE — ED Notes (Signed)
Pt c/o headache

## 2016-08-04 NOTE — Progress Notes (Signed)
Pharmacy Antibiotic Note  Latoya [name] is a 70 y.o. female admitted on 08/04/2016 with fevers, possible PNA.  Pharmacy has been consulted for Vancomycin and Cefepime  Dosing.  Vancomycin 1 g IV given in ED at  0100  Plan: Vancomycin 1 g IV q12h Cefepime 1 g IV q8h  Height: 5\' 4"  (162.6 cm) Weight: 159 lb 13.3 oz (72.5 kg) IBW/kg (Calculated) : 54.7  Temp (24hrs), Avg:98.7 F (37.1 C), Min:98.3 F (36.8 C), Max:99.3 F (37.4 C)   Recent Labs Lab 08/03/16 2213 08/04/16 0317  WBC 16.4* 19.9*  CREATININE 0.78 0.69    Estimated Creatinine Clearance: 64.8 mL/min (by C-G formula based on SCr of 0.69 mg/dL).    Allergies  Allergen Reactions  . Enoxaparin Sodium Anaphylaxis    REACTION: thrombocytopenia  . Heparin Anaphylaxis    REACTION: thrombocytopenia  . Hornet Venom Anaphylaxis  . Other Anaphylaxis    Sensitive to dye in Betadine & Chlorohexadine   . Pork-Derived Products Anaphylaxis  . Chlorhexidine Itching    Reports that it is the dye in it  . Lactose Intolerance (Gi) Diarrhea and Nausea And Vomiting    Can tolerate most [place] cheeses (Takes Lactaid)  . Indomethacin Rash  . Phenylpropanolamine Hypertension  . Povidone Iodine Rash  . Povidone-Iodine Itching and Rash    Sensitivity- but if its wiped off she is able to tolerate betadine     Caryl Pina 08/04/2016 6:19 AM

## 2016-08-04 NOTE — Progress Notes (Signed)
New Admission Note: pt admitted from [facility] to room 6E20  Arrival Method: via stretcher Mental Orientation: alert and oriented x 4 Telemetry: placed on box 6E20 Assessment: Completed Skin: Intact IV: R wrist Pain: Denies Tubes: None Safety Measures: Safety Fall Prevention Plan has been discussed  Admission: To be completed 6 [place] Orientation: Patient has been orientated to the room, unit and staff.  Family: none at bedside  Orders to be reviewed and implemented. Will continue to monitor the patient. Call light has been placed within reach and bed alarm has been activated.   Mady Gemma, BSN, RN-BC Phone: [phone]

## 2016-08-04 NOTE — Progress Notes (Signed)
PROGRESS NOTE    Latoya [name]  [id number] DOB: 12/10/1945 DOA: 08/04/2016 PCP: Thressa Sheller, MD     Brief Narrative:  Latoya [name] is a 70 y.o. female with paroxysmal atrial fibrillation, moderate CAD, hypertension, asthma, hypothyroidism, hyperlipidemia, antiphospholipid syndrome who was admitted in August 2017 for sepsis secondary to ascending cholangitis and is on biliary drain presents to the ER after patient had 3-4 episodes of nausea vomiting after dinner yesterday and right-sided pleuritic chest pain and patient not feeling well. In the ER x-ray was showing features concerning for pneumonia and lab work showed leukocytosis. Patient states at home, patient had recorded a temperature of 103F. Denies any diarrhea. Patient has a biliary drain from recent cholangitis. Has been followed up by Dr. Brantley Stage, general surgeon. Patient is being admitted for further management of healthcare associated pneumonia (?) and nausea vomiting.   Assessment & Plan:   Principal Problem:   Healthcare-associated pneumonia Active Problems:   Primary hypercoagulable state ([place])   Essential hypertension   CAD, NATIVE VESSEL   PAROXYSMAL ATRIAL FIBRILLATION   Asthma   PAD (peripheral artery disease) (HCC)   Pneumonia   Leukocytosis, nausea, vomiting  -Unclear etiology. Admitted with presumption of HCAP but denies any shortness of breath or cough. Recent ascending cholangitis with biliary drain in place. Follows with Dr. Brantley Stage, general surgeon -Strep pneumo Ag negative  -Legionella Ag pending  -Blood cultures pending  -Continue empiric Vanco and cefepime -Check influenza  -IVF  -General surgery consult  Paroxysmal atrial fibrillation -CHADSVASc 3 -Continue Xarelto, amiodarone, lopressor   Hypothyroidism -Continue Synthroid  Chronic normocytic anemia -Baseline Hgb 8-9 -Stable   Essential HTN -Well controlled  -Continue lopressor   CAD -Continue Asa, lipitor   Asymptomatic  pyuria -Not UTI. Monitor for symptoms    DVT prophylaxis: xarelto Code Status: full Family Communication: husband at bedside Disposition Plan: pending further work up and improvement   Consultants:   None  Procedures:   None  Antimicrobials:   Vanco 10/14 >>  Cefepime 10/14 >>    Subjective: Patient states that she has had nausea and vomiting since Friday. She also admits to generalized weakness, as well as subjective fever. She states that after her drain was placed in August, she had been progressing well. She had had a abdominal ultrasound last week and has a follow-up on Monday for drain removal. She states that she has been emptying her drain twice a day. She denies any specific abdominal pain, although she does note some soreness at the drain site. She denies any shortness of breath or cough in the past week. She states that she was told in the past that she has scarring on her right lung from previous procedures. This is chronic in nature.  Objective: Vitals:   08/04/16 0300 08/04/16 0315 08/04/16 0351 08/04/16 0917  BP: (!) 118/45 111/92 (!) 119/92 (!) 107/40  Pulse: 93 94 88 75  Resp: 24 15 19 18   Temp:   98.5 F (36.9 C) 97.6 F (36.4 C)  TempSrc:   Oral Oral  SpO2: 91% 97% 100% 100%  Weight:   72.5 kg (159 lb 13.3 oz)   Height:   5\' 4"  (1.626 m)     Intake/Output Summary (Last 24 hours) at 08/04/16 1141 Last data filed at 08/04/16 0918  Gross per 24 hour  Intake              240 ml  Output  650 ml  Net             -410 ml   Filed Weights   08/04/16 0351  Weight: 72.5 kg (159 lb 13.3 oz)    Examination:  General exam: Appears calm and comfortable  Respiratory system: Respiratory effort normal. Crackles right lung base, no wheeze.  Cardiovascular system: S1 & S2 heard, RRR. No JVD, murmurs, rubs, gallops or clicks. No pedal edema. Gastrointestinal system: Abdomen is nondistended, soft, minimally TTP RUQ, biliary drain in place draining  about 270mL.  Central nervous system: Alert and oriented. No focal neurological deficits. Extremities: Symmetric 5 x 5 power. Skin: No rashes, lesions or ulcers Psychiatry: Judgement and insight appear normal. Mood & affect appropriate.   Data Reviewed: I have personally reviewed following labs and imaging studies  CBC:  Recent Labs Lab 08/03/16 2213 08/04/16 0317  WBC 16.4* 19.9*  NEUTROABS 14.9* 17.6*  HGB 11.2* 9.5*  HCT 34.4* 28.6*  MCV 89.1 87.2  PLT 197 [id number]   Basic Metabolic Panel:  Recent Labs Lab 08/03/16 2213 08/04/16 0317  NA 135 135  K 4.1 3.8  CL 99* 102  CO2 24 23  GLUCOSE 123* 108*  BUN 12 11  CREATININE 0.78 0.69  CALCIUM 9.8 9.0   GFR: Estimated Creatinine Clearance: 64.8 mL/min (by C-G formula based on SCr of 0.69 mg/dL). Liver Function Tests:  Recent Labs Lab 08/03/16 2213 08/04/16 0317  AST 23 18  ALT 13* 14  ALKPHOS 81 65  BILITOT 0.6 0.8  PROT 7.1 5.8*  ALBUMIN 4.4 3.5    Recent Labs Lab 08/03/16 2213  LIPASE 24   No results for input(s): AMMONIA in the last 168 hours. Coagulation Profile: No results for input(s): INR, PROTIME in the last 168 hours. Cardiac Enzymes: No results for input(s): CKTOTAL, CKMB, CKMBINDEX, TROPONINI in the last 168 hours. BNP (last 3 results) No results for input(s): PROBNP in the last 8760 hours. HbA1C: No results for input(s): HGBA1C in the last 72 hours. CBG: No results for input(s): GLUCAP in the last 168 hours. Lipid Profile: No results for input(s): CHOL, HDL, LDLCALC, TRIG, CHOLHDL, LDLDIRECT in the last 72 hours. Thyroid Function Tests: No results for input(s): TSH, T4TOTAL, FREET4, T3FREE, THYROIDAB in the last 72 hours. Anemia Panel: No results for input(s): VITAMINB12, FOLATE, FERRITIN, TIBC, IRON, RETICCTPCT in the last 72 hours. Sepsis Labs:  Recent Labs Lab 08/04/16 0904  LATICACIDVEN 1.4    No results found for this or any previous visit (from the past 240 hour(s)).      Radiology Studies: Dg Chest 2 View  Result Date: 08/03/2016 CLINICAL DATA:  Right ribcage pain with shortness of breath and emesis EXAM: CHEST  2 VIEW COMPARISON:  06/16/2016 FINDINGS: Right diaphragm is mildly elevated. Streaky and hazy atelectasis or infiltrate at the right base. No large effusion. Heart size stable. No pneumothorax. Partially visualized lower cervical hardware. Posterior stabilization rod and screw fixation of the spine. There is a drain in the right upper quadrant. IMPRESSION: Mildly elevated right diaphragm. Hazy atelectasis or infiltrate at the right lung base. Electronically Signed   By: Donavan Foil M.D.   On: 08/03/2016 22:01   CXR 10/13 was reviewed independently: Elevated right hemidiaphragm. Infiltrate at right base. No pleural effusion. No pneumothorax.  Scheduled Meds: . amiodarone  200 mg Oral Daily  . aspirin EC  81 mg Oral Daily  . atorvastatin  40 mg Oral Daily  . azelastine  1 spray Each Nare BID  .  budesonide (PULMICORT) nebulizer solution  0.5 mg Nebulization BID  . calcium-vitamin D  1 tablet Oral BID  . ceFEPime (MAXIPIME) IV  1 g Intravenous Q8H  . docusate sodium  200 mg Oral BID  . fentaNYL  50 mcg Transdermal Q72H  . gabapentin  300 mg Oral QHS  . levothyroxine  75 mcg Oral QAC breakfast  . metoprolol  50 mg Oral BID  . montelukast  10 mg Oral QHS  . multivitamin with minerals  1 tablet Oral Daily  . pantoprazole  40 mg Oral Daily  . rivaroxaban  20 mg Oral Q breakfast  . vancomycin  1,000 mg Intravenous Q12H   Continuous Infusions: . sodium chloride 100 mL/hr at 08/04/16 0508     LOS: 0 days    Time spent: 40 minutes   Dessa Phi, DO [facility] [email or web address] Password [email or web address] 08/04/2016, 11:41 AM

## 2016-08-04 NOTE — Consult Note (Signed)
Reason for Consult: Cholecystostomy tube, nausea vomiting Referring Physician: Dr Lisabeth Register is an 69 y.o. female.  HPI: 70 year old female with antiphospholipid syndrome, coronary artery disease, hypertension, hyperlipidemia and recent hospitalization in August from the 14th until the 31st for bacteremia and sepsis comes back to the emergency room with 3-4 episodes of nausea and vomiting after dinner Friday evening. She also had some right-sided pleuritic chest pain. Workup in the emergency department revealed an elevated white blood cell count, and a chest x-ray with a possible right lower lung infiltrate. She was admitted for possible healthcare associated pneumonia along with nausea and vomiting. She states that since discharge she has been getting progressively better and stronger. At times she would have some burping and belching after certain foods since discharge. She states that she has to watch what she eats. She has just been having some mild soreness around the drain site but otherwise no abdominal pain. Yesterday she felt feverish all day and had some chills like she did just before coming into the hospital in August she then had several episodes of nausea and vomiting.  Back in August she presented with epigastric pain and became less responsive and was intubated in the emergency department. She had some reported tenderness in her upper abdomen and had elevated LFTs. Ultrasound at that time showed no gallstones and no significant wall thickening. She underwent percutaneous cholecystostomy tube placement. She had Escherichia coli and enterococcal bacteremia. She has been dependent respiratory failure was ultimately discharged to a skilled nursing facility.  She had a follow-up cholangiogram done through her cholecystostomy tube September 26 which showed patency of the cystic and common bile duct. A follow-up ultrasound on October 4 showed sludge without any evidence of gallstones and a  contracted gallbladder. She had seen Dr. Brantley Stage in the office at the end of September. He ordered a repeat ultrasound to see if there is any gallstones. He had recommend keeping the tube a few more weeks and then the goal was to try to remove the tube without having to do cholecystectomy since there is no evidence of cholelithiasis on any imaging  She has never had a capping trial of her cholecystostomy tube  Past Medical History:  Diagnosis Date  . Anemia, iron deficiency   . Antiphospholipid syndrome (HCC)    with hypercoagulable state  . Asthma    extrinsic; moderate, persistant, nml spirometry 2010, nml CXR 1/08  . Bladder troubles    REPORTS INFECTIONS ON OCCASION DUE TO URETHRA MEATUS STRICTURE AT BIRTH   . Blood dyscrasia    antiphospholipid disorder  . CAD (coronary artery disease)    50% mid LAD, 80% ostial D1 and moderate 80% mid circ by vath 2003  . Cancer ([facility])    basal cell removed fr. L arm   . Complication of anesthesia    cardiac arrest- in OR, at age 59 (86)y.o. during Scalenotomy in her early 20's  . Coronary heart disease   . Drug allergy    heparin/lovenox  . DVT (deep venous thrombosis) ([place])   . Erosive gastritis 1994  . Family history of adverse reaction to anesthesia    some family members have had trouble waking up  . GERD (gastroesophageal reflux disease)   . H/O hiatal hernia   . HLD (hyperlipidemia)   . HTN (hypertension)    [name], manages pt.  LOV 03/2014  . Hypothyroidism   . Liver spot    cyst- - no biopsy, but  told that its benign   . PAF (paroxysmal atrial fibrillation) (HCC)    not on coumadin therapy  . Pulmonary embolism ([place])    related to back surgery with prior coumadin use, now off  . Spondylosis, lumbosacral    ARTHRITIS- OA     Past Surgical History:  Procedure Laterality Date  . ABDOMINAL HYSTERECTOMY     partial abdominal -1998  . BACK SURGERY     x13 cervical and lumbar thoracic spine surgery  . BREAST  ENHANCEMENT SURGERY    . BREAST SURGERY     all benign cysts x3   . CARDIAC CATHETERIZATION     2005  . cleft lip and palate repair     70 yo   . hysterectomy    . IR GENERIC HISTORICAL  06/04/2016   IR PERC CHOLECYSTOSTOMY 06/04/2016 Greggory Keen, MD [facility]  . JOINT REPLACEMENT    . POSTERIOR CERVICAL FUSION/FORAMINOTOMY Right 08/31/2014   Procedure: Right cervical six-seven foraminotomy, Cervical five-Thoracic one fusion, Cervical six-seven lateral mass screws;  Surgeon: Erline Levine, MD;  Location: [facility];  Service: Neurosurgery;  Laterality: Right;  . spina bifida repair     70 yo   . TONSILLECTOMY    . TOTAL KNEE ARTHROPLASTY     left    Family History  Problem Relation Age of Onset  . Heart disease Father     MI   . Heart disease Sister     Social History:  reports that she has never smoked. She has never used smokeless tobacco. She reports that she does not drink alcohol or use drugs.  Allergies:  Allergies  Allergen Reactions  . Enoxaparin Sodium Anaphylaxis    REACTION: thrombocytopenia  . Heparin Anaphylaxis    REACTION: thrombocytopenia  . Hornet Venom Anaphylaxis  . Other Anaphylaxis    Sensitive to dye in Betadine & Chlorohexadine   . Pork-Derived Products Anaphylaxis  . Chlorhexidine Itching    Reports that it is the dye in it  . Lactose Intolerance (Gi) Diarrhea and Nausea And Vomiting    Can tolerate most [place] cheeses (Takes Lactaid)  . Indomethacin Rash  . Phenylpropanolamine Hypertension  . Povidone Iodine Rash  . Povidone-Iodine Itching and Rash    Sensitivity- but if its wiped off she is able to tolerate betadine     Medications: I have reviewed the patient's current medications.  Results for orders placed or performed during the hospital encounter of 08/04/16 (from the past 48 hour(s))  CBC with Differential     Status: Abnormal   Collection Time: 08/03/16 10:13 PM  Result Value Ref Range   WBC 16.4 (H) 4.0 - 10.5 K/uL   RBC  3.86 (L) 3.87 - 5.11 MIL/uL   Hemoglobin 11.2 (L) 12.0 - 15.0 g/dL   HCT 34.4 (L) 36.0 - 46.0 %   MCV 89.1 78.0 - 100.0 fL   MCH 29.0 26.0 - 34.0 pg   MCHC 32.6 30.0 - 36.0 g/dL   RDW 14.8 11.5 - 15.5 %   Platelets 197 150 - 400 K/uL   Neutrophils Relative % 91 %   Neutro Abs 14.9 (H) 1.7 - 7.7 K/uL   Lymphocytes Relative 5 %   Lymphs Abs 0.8 0.7 - 4.0 K/uL   Monocytes Relative 3 %   Monocytes Absolute 0.6 0.1 - 1.0 K/uL   Eosinophils Relative 1 %   Eosinophils Absolute 0.1 0.0 - 0.7 K/uL   Basophils Relative 0 %   Basophils Absolute  0.0 0.0 - 0.1 K/uL  Comprehensive metabolic panel     Status: Abnormal   Collection Time: 08/03/16 10:13 PM  Result Value Ref Range   Sodium 135 135 - 145 mmol/L   Potassium 4.1 3.5 - 5.1 mmol/L   Chloride 99 (L) 101 - 111 mmol/L   CO2 24 22 - 32 mmol/L   Glucose, Bld 123 (H) 65 - 99 mg/dL   BUN 12 6 - 20 mg/dL   Creatinine, Ser 0.78 0.44 - 1.00 mg/dL   Calcium 9.8 8.9 - 10.3 mg/dL   Total Protein 7.1 6.5 - 8.1 g/dL   Albumin 4.4 3.5 - 5.0 g/dL   AST 23 15 - 41 U/L   ALT 13 (L) 14 - 54 U/L   Alkaline Phosphatase 81 38 - 126 U/L   Total Bilirubin 0.6 0.3 - 1.2 mg/dL   GFR calc non Af Amer >60 >60 mL/min   GFR calc Af Amer >60 >60 mL/min    Comment: (NOTE) The eGFR has been calculated using the CKD EPI equation. This calculation has not been validated in all clinical situations. eGFR's persistently <60 mL/min signify possible Chronic Kidney Disease.    Anion gap 12 5 - 15  Lipase, blood     Status: None   Collection Time: 08/03/16 10:13 PM  Result Value Ref Range   Lipase 24 11 - 51 U/L  I-Stat Troponin, ED (not at [facility])     Status: None   Collection Time: 08/03/16 10:27 PM  Result Value Ref Range   Troponin i, poc 0.00 0.00 - 0.08 ng/mL   Comment 3            Comment: Due to the release kinetics of cTnI, a negative result within the first hours of the onset of symptoms does not rule out myocardial infarction with certainty. If  myocardial infarction is still suspected, repeat the test at appropriate intervals.   Urinalysis, Routine w reflex microscopic (not at [facility])     Status: Abnormal   Collection Time: 08/04/16  1:30 AM  Result Value Ref Range   Color, Urine AMBER (A) YELLOW    Comment: BIOCHEMICALS MAY BE AFFECTED BY COLOR   APPearance CLOUDY (A) CLEAR   Specific Gravity, Urine 1.019 1.005 - 1.030   pH 5.0 5.0 - 8.0   Glucose, UA NEGATIVE NEGATIVE mg/dL   Hgb urine dipstick SMALL (A) NEGATIVE   Bilirubin Urine NEGATIVE NEGATIVE   Ketones, ur NEGATIVE NEGATIVE mg/dL   Protein, ur NEGATIVE NEGATIVE mg/dL   Nitrite NEGATIVE NEGATIVE   Leukocytes, UA SMALL (A) NEGATIVE  Urine microscopic-add on     Status: Abnormal   Collection Time: 08/04/16  1:30 AM  Result Value Ref Range   Squamous Epithelial / LPF 0-5 (A) NONE SEEN   WBC, UA 0-5 0 - 5 WBC/hpf   RBC / HPF 0-5 0 - 5 RBC/hpf   Bacteria, UA RARE (A) NONE SEEN   Casts HYALINE CASTS (A) NEGATIVE  Strep pneumoniae urinary antigen     Status: None   Collection Time: 08/04/16  1:30 AM  Result Value Ref Range   Strep Pneumo Urinary Antigen NEGATIVE NEGATIVE    Comment:        Infection due to S. pneumoniae cannot be absolutely ruled out since the antigen present may be below the detection limit of the test.   Hepatic function panel     Status: Abnormal   Collection Time: 08/04/16  3:17 AM  Result Value Ref  Range   Total Protein 5.8 (L) 6.5 - 8.1 g/dL   Albumin 3.5 3.5 - 5.0 g/dL   AST 18 15 - 41 U/L   ALT 14 14 - 54 U/L   Alkaline Phosphatase 65 38 - 126 U/L   Total Bilirubin 0.8 0.3 - 1.2 mg/dL   Bilirubin, Direct 0.1 0.1 - 0.5 mg/dL   Indirect Bilirubin 0.7 0.3 - 0.9 mg/dL  Basic metabolic panel     Status: Abnormal   Collection Time: 08/04/16  3:17 AM  Result Value Ref Range   Sodium 135 135 - 145 mmol/L   Potassium 3.8 3.5 - 5.1 mmol/L   Chloride 102 101 - 111 mmol/L   CO2 23 22 - 32 mmol/L   Glucose, Bld 108 (H) 65 - 99 mg/dL   BUN 11  6 - 20 mg/dL   Creatinine, Ser 0.69 0.44 - 1.00 mg/dL   Calcium 9.0 8.9 - 10.3 mg/dL   GFR calc non Af Amer >60 >60 mL/min   GFR calc Af Amer >60 >60 mL/min    Comment: (NOTE) The eGFR has been calculated using the CKD EPI equation. This calculation has not been validated in all clinical situations. eGFR's persistently <60 mL/min signify possible Chronic Kidney Disease.    Anion gap 10 5 - 15  CBC WITH DIFFERENTIAL     Status: Abnormal   Collection Time: 08/04/16  3:17 AM  Result Value Ref Range   WBC 19.9 (H) 4.0 - 10.5 K/uL   RBC 3.28 (L) 3.87 - 5.11 MIL/uL   Hemoglobin 9.5 (L) 12.0 - 15.0 g/dL   HCT 28.6 (L) 36.0 - 46.0 %   MCV 87.2 78.0 - 100.0 fL   MCH 29.0 26.0 - 34.0 pg   MCHC 33.2 30.0 - 36.0 g/dL   RDW 14.7 11.5 - 15.5 %   Platelets 165 150 - 400 K/uL   Neutrophils Relative % 88 %   Neutro Abs 17.6 (H) 1.7 - 7.7 K/uL   Lymphocytes Relative 6 %   Lymphs Abs 1.2 0.7 - 4.0 K/uL   Monocytes Relative 6 %   Monocytes Absolute 1.1 (H) 0.1 - 1.0 K/uL   Eosinophils Relative 0 %   Eosinophils Absolute 0.0 0.0 - 0.7 K/uL   Basophils Relative 0 %   Basophils Absolute 0.0 0.0 - 0.1 K/uL  Lactic acid, plasma     Status: None   Collection Time: 08/04/16  9:04 AM  Result Value Ref Range   Lactic Acid, Venous 1.4 0.5 - 1.9 mmol/L  Influenza panel by PCR (type A & B, H1N1)     Status: None   Collection Time: 08/04/16  3:34 PM  Result Value Ref Range   Influenza A By PCR NEGATIVE NEGATIVE   Influenza B By PCR NEGATIVE NEGATIVE   H1N1 flu by pcr NOT DETECTED NOT DETECTED    Comment:        The Xpert Flu assay (FDA approved for nasal aspirates or washes and nasopharyngeal swab specimens), is intended as an aid in the diagnosis of influenza and should not be used as a sole basis for treatment.     Dg Chest 2 View  Result Date: 08/03/2016 CLINICAL DATA:  Right ribcage pain with shortness of breath and emesis EXAM: CHEST  2 VIEW COMPARISON:  06/16/2016 FINDINGS: Right  diaphragm is mildly elevated. Streaky and hazy atelectasis or infiltrate at the right base. No large effusion. Heart size stable. No pneumothorax. Partially visualized lower cervical hardware. Posterior stabilization  rod and screw fixation of the spine. There is a drain in the right upper quadrant. IMPRESSION: Mildly elevated right diaphragm. Hazy atelectasis or infiltrate at the right lung base. Electronically Signed   By: Donavan Foil M.D.   On: 08/03/2016 22:01    Review of Systems  All other systems reviewed and are negative.  Blood pressure (!) 110/46, pulse 78, temperature 98 F (36.7 C), temperature source Oral, resp. rate 18, height '5\' 4"'  (1.626 m), weight 72.5 kg (159 lb 13.3 oz), SpO2 100 %. Physical Exam  Vitals reviewed. Constitutional: She is oriented to person, place, and time. She appears well-developed and well-nourished. No distress.  Morbidly obese  HENT:  Head: Normocephalic and atraumatic.  Right Ear: External ear normal.  Left Ear: External ear normal.  Eyes: Conjunctivae are normal. No scleral icterus.  Neck: Normal range of motion. Neck supple. No tracheal deviation present. No thyromegaly present.  Cardiovascular: Normal rate and normal heart sounds.   Respiratory: Effort normal and breath sounds normal. No stridor. No respiratory distress. She has no wheezes.  GI: Soft. She exhibits no distension. There is no tenderness. There is no rebound.  Gallbladder drain secure, bile n bag  Musculoskeletal: She exhibits no edema or tenderness.  Lymphadenopathy:    She has no cervical adenopathy.  Neurological: She is alert and oriented to person, place, and time. She exhibits normal muscle tone.  Skin: Skin is warm and dry. No rash noted. She is not diaphoretic. No erythema. No pallor.  Psychiatric: She has a normal mood and affect. Her behavior is normal. Judgment and thought content normal.    Assessment/Plan: History of bacteremia with possible  cholangitis Cholecystostomy tube August 2017 Paroxysmal atrial fibrillation Antiphospholipid syndrome Chronic anticoagulation Coronary artery disease Hypertension elevated white blood cell count Possible healthcare associated pneumonia Hypothyroidism Nausea-vomiting  She had a recent cholangiogram which demonstrated patency of the biliary tree with no signs of cystic duct obstruction.  Not sure of the etiology of her ongoing nausea and vomiting yesterday.  Agree with repeat blood cultures  We'll repeat cholangiogram through cholecystostomy tube to verify that biliary cystic duct is patent.  If cystic duct is patent with consider capping her cholecystostomy tube to see how she tolerates the capping trial. If she does not tolerate a capping trial then she may ultimately need cholecystectomy  Leighton Ruff. Redmond Pulling, MD, FACS General, Bariatric, & Minimally Invasive Surgery [facility], [place]       [facility] 08/04/2016, 7:53 PM

## 2016-08-04 NOTE — ED Provider Notes (Signed)
[facility] DEPT Provider Note   CSN: [id number] Arrival date & time: 08/03/16  2110  By signing my name below, I, Latoya [name], attest that this documentation has been prepared under the direction and in the presence of Livianna Petraglia, MD. Electronically Signed: Reola [name], ED Scribe. 08/04/16. 12:44 AM.  History   Chief Complaint Chief Complaint  Patient presents with  . Ribcage Pain  . Emesis   The history is provided by the patient and medical records. No language interpreter was used.  Emesis   This is a new problem. The current episode started 12 to 24 hours ago. The problem occurs continuously. The problem has not changed since onset.The emesis has an appearance of stomach contents. The maximum temperature recorded prior to her arrival was 102 to 102.9 F. The fever has been present for less than 1 day. Associated symptoms include chills, a fever (Tmax 102) and headaches. Pertinent negatives include no cough and no myalgias. Abdominal pain: RUQ.   HPI Comments: Latoya [name] is a 70 y.o. female with a PMHx of DVT/PE, who presents to the Emergency Department complaining of intermittent episodes of nausea and vomiting onset today PTA. Pt reports associated waxing and waning fever (Tmax 102), RUQ abdominal pain, intermittent chills, and a throbbing headache secondary to the onset of her nausea/vomiting today. Per prior chart review, pt has prior admission on 06/04/16 (~1 month ago) that lasted for ~17 days. At that time she was admitted for septic shock w/ associated respiratory failure and ventilatory therapy placement. Pt states that her symptoms today feel similar to her last admission; however, less severe. Pt had a biliary drain placement to her RUQ during her previous admission which she states has been draining normally since the onset of her symptoms. She denies cough, dysuria, sore throat, ear pain, or any other associated symptoms.   Past Medical History:    Diagnosis Date  . Anemia, iron deficiency   . Antiphospholipid syndrome (HCC)    with hypercoagulable state  . Asthma    extrinsic; moderate, persistant, nml spirometry 2010, nml CXR 1/08  . Bladder troubles    REPORTS INFECTIONS ON OCCASION DUE TO URETHRA MEATUS STRICTURE AT BIRTH   . Blood dyscrasia    antiphospholipid disorder  . CAD (coronary artery disease)    50% mid LAD, 80% ostial D1 and moderate 80% mid circ by vath 2003  . Cancer ([place])    basal cell removed fr. L arm   . Complication of anesthesia    cardiac arrest- in OR, at age 79 (64)y.o. during Scalenotomy in her early 20's  . Coronary heart disease   . Drug allergy    heparin/lovenox  . DVT (deep venous thrombosis) ([place])   . Erosive gastritis 1994  . Family history of adverse reaction to anesthesia    some family members have had trouble waking up  . GERD (gastroesophageal reflux disease)   . H/O hiatal hernia   . HLD (hyperlipidemia)   . HTN (hypertension)    [name] at [facility], manages pt.  LOV 03/2014  . Hypothyroidism   . Liver spot    cyst- - no biopsy, but told that its benign   . PAF (paroxysmal atrial fibrillation) (HCC)    not on coumadin therapy  . Pulmonary embolism ([place])    related to back surgery with prior coumadin use, now off  . Spondylosis, lumbosacral    ARTHRITIS- OA    Patient Active Problem List   Diagnosis Date  Noted  . Cholangitis   . Gastroparesis   . Acute respiratory failure ([place])   . Enterococcal bacteremia 06/06/2016  . Bacteremia due to Klebsiella pneumoniae 06/06/2016  . Bacteremia due to Escherichia coli 06/06/2016  . Septic shock ([place])   . Acute respiratory failure with hypoxia ([place])   . Ascending cholangitis 06/04/2016  . Sepsis ([place]) 06/04/2016  . Hemangioma of liver 06/04/2016  . Severe sepsis ([place]) 06/04/2016  . Cervical pseudoarthrosis ([place]) 08/31/2014  . Anaphylaxis, mild, due to wasp envenomation 04/12/2013  . Chest pain, atypical 04/11/2013  . PAD  (peripheral artery disease) ([place]) 06/20/2011  . ANEMIA, IRON DEFICIENCY, MICROCYTIC 06/01/2010  . Primary hypercoagulable state ([place]) 06/01/2010  . BACK PAIN, CHRONIC 06/01/2010  . ABDOMINAL PAIN -GENERALIZED 06/01/2010  . CAROTID ARTERY DISEASE 05/25/2010  . CAD, NATIVE VESSEL 11/15/2009  . MURMUR 11/15/2009  . CAROTID BRUIT, LEFT 11/15/2009  . HYPERTENSION 03/18/2009  . History of pulmonary embolism 03/18/2009  . PAROXYSMAL ATRIAL FIBRILLATION 03/18/2009  . DVT 03/18/2009  . Asthma 03/18/2009  . GERD 03/18/2009  . SPONDYLOSIS, LUMBAR 03/18/2009  . HYPERLIPIDEMIA 11/29/2008  . CORONARY HEART DISEASE 11/29/2008   Past Surgical History:  Procedure Laterality Date  . ABDOMINAL HYSTERECTOMY     partial abdominal -1998  . BACK SURGERY     x13 cervical and lumbar thoracic spine surgery  . BREAST ENHANCEMENT SURGERY    . BREAST SURGERY     all benign cysts x3   . CARDIAC CATHETERIZATION     2005  . cleft lip and palate repair     70 yo   . hysterectomy    . IR GENERIC HISTORICAL  06/04/2016   IR PERC CHOLECYSTOSTOMY 06/04/2016 Greggory Keen, MD [facility]  . JOINT REPLACEMENT    . POSTERIOR CERVICAL FUSION/FORAMINOTOMY Right 08/31/2014   Procedure: Right cervical six-seven foraminotomy, Cervical five-Thoracic one fusion, Cervical six-seven lateral mass screws;  Surgeon: Erline Levine, MD;  Location: [facility];  Service: Neurosurgery;  Laterality: Right;  . spina bifida repair     70 yo   . TONSILLECTOMY    . TOTAL KNEE ARTHROPLASTY     left   OB History    No data available     Home Medications    Prior to Admission medications   Medication Sig Start Date End Date Taking? Authorizing Provider  albuterol (PROVENTIL HFA;VENTOLIN HFA) 108 (90 BASE) MCG/ACT inhaler Inhale 2 puffs into the lungs every 6 (six) hours as needed for wheezing or shortness of breath.     Historical Provider, MD  amiodarone (PACERONE) 200 MG tablet Take 1 tablet (200 mg total) by mouth daily.  06/21/16   Verlee Monte, MD  aspirin 81 MG EC tablet Take 81 mg by mouth daily.     Historical Provider, MD  atorvastatin (LIPITOR) 20 MG tablet Take 1 tablet (20 mg total) by mouth daily. Patient not taking: Reported on 06/04/2016 11/09/15   Burtis Junes, NP  atorvastatin (LIPITOR) 40 MG tablet Take 40 mg by mouth daily.    Historical Provider, MD  azelastine (ASTELIN) 137 MCG/SPRAY nasal spray Place 1 spray into the nose 2 (two) times daily. Use in each nostril as directed For allergies    Historical Provider, MD  beclomethasone (QVAR) 80 MCG/ACT inhaler Inhale 2 puffs into the lungs 2 (two) times daily. Patient taking differently: Inhale 2 puffs into the lungs 3 (three) times daily.  06/24/14   Elsie Stain, MD  Calcium Carb-Cholecalciferol (CALCIUM 500 +D) 500-400  MG-UNIT TABS Take 1 tablet by mouth 2 (two) times daily.    Historical Provider, MD  docusate sodium (COLACE) 100 MG capsule Take 200 mg by mouth 2 (two) times daily.     Historical Provider, MD  EPINEPHrine (EPIPEN) 0.3 mg/0.3 mL DEVI Inject 0.3 mLs (0.3 mg total) into the muscle once. Patient taking differently: Inject 0.3 mg into the muscle once as needed.  04/12/13   [name] Rama, MD  esomeprazole (NEXIUM) 40 MG capsule Take 40 mg by mouth 2 (two) times daily.     Historical Provider, MD  FentaNYL 37.5 MCG/HR PT72 Place 1 patch onto the skin every 3 (three) days. 06/21/16   Verlee Monte, MD  Glucosamine-Chondroitin 750-600 MG TABS Take 1 tablet by mouth 2 (two) times daily.    Historical Provider, MD  HYDROmorphone (DILAUDID) 4 MG tablet Take 1 tablet (4 mg total) by mouth every 4 (four) hours as needed. For pain 06/21/16   Verlee Monte, MD  levothyroxine (SYNTHROID, LEVOTHROID) 75 MCG tablet Take 75 mcg by mouth daily before breakfast.     Historical Provider, MD  lidocaine (LIDODERM) 5 % Place 1 patch onto the skin daily. Remove & Discard patch within 12 hours or as directed by MD    Historical Provider, MD    Loratadine-Pseudoephedrine (CLARITIN-D 12 HOUR PO) Take 1 tablet by mouth 2 (two) times daily.     Historical Provider, MD  Melatonin 2.5 MG CAPS Take 2.5-5 mg by mouth at bedtime.    Historical Provider, MD  metaxalone (SKELAXIN) 800 MG tablet Take 800 mg by mouth every 8 (eight) hours as needed. For muscle pain    Historical Provider, MD  metoprolol (LOPRESSOR) 50 MG tablet Take 1 tablet (50 mg total) by mouth 2 (two) times daily. 11/09/15   Burtis Junes, NP  montelukast (SINGULAIR) 10 MG tablet Take 10 mg by mouth at bedtime.     Historical Provider, MD  Multiple Vitamin (MULTIVITAMIN WITH MINERALS) TABS Take 1 tablet by mouth daily.    Historical Provider, MD  NITROSTAT 0.4 MG SL tablet DISSOLVE 1 TABLET UNDER TONGUE AS NEEDED FOR CHEST PAIN,MAY REPEAT IN5 MINUTES FOR 2 DOSES. 02/24/16   Burnell Blanks, MD  Respiratory Therapy Supplies (FLUTTER) DEVI Use as directed 06/06/15   Tanda Rockers, MD  Rivaroxaban (XARELTO) 15 MG TABS tablet Take 1 tablet (15 mg total) by mouth daily with supper. 06/21/16   Verlee Monte, MD  triamcinolone (NASACORT) 55 MCG/ACT nasal inhaler Place 1-2 sprays into the nose 2 (two) times daily.     Historical Provider, MD    Family History Family History  Problem Relation Age of Onset  . Heart disease Father     MI   . Heart disease Sister     Social History Social History  Substance Use Topics  . Smoking status: Never Smoker  . Smokeless tobacco: Never Used     Comment: no smoking   . Alcohol use No   Allergies   Hornet venom; Pork-derived products; Chlorhexidine; Enoxaparin sodium; Heparin; Lactose intolerance (gi); Other; Povidone-iodine; Indomethacin; Phenylpropanolamine; and Povidone iodine  Review of Systems Review of Systems  Constitutional: Positive for chills and fever (Tmax 102).  HENT: Negative for ear pain and sore throat.   Respiratory: Negative for cough.   Gastrointestinal: Positive for nausea and vomiting. Abdominal pain: RUQ.   Genitourinary: Negative for dysuria.  Musculoskeletal: Negative for myalgias, neck pain and neck stiffness.  Neurological: Positive for headaches.  All other  systems reviewed and are negative.  Physical Exam Updated Vital Signs BP 140/59 (BP Location: Left Arm)   Pulse 96   Temp 99.3 F (37.4 C) (Oral)   Resp 16   SpO2 100%   Physical Exam  Constitutional: She is oriented to person, place, and time. She appears well-developed and well-nourished. No distress.  HENT:  Head: Normocephalic and atraumatic.  Nose: Nose normal.  Mouth/Throat: Oropharynx is clear and moist. No oropharyngeal exudate.  Eyes: Conjunctivae and EOM are normal. Pupils are equal, round, and reactive to light. Right eye exhibits no discharge. Left eye exhibits no discharge. No scleral icterus.  Neck: Normal range of motion. Neck supple. No JVD present. No tracheal deviation present.  Trachea is midline. No stridor or carotid bruits.   Cardiovascular: Normal rate, regular rhythm, normal heart sounds and intact distal pulses.   No murmur heard. Pulmonary/Chest: Effort normal. No stridor. No respiratory distress. She has no wheezes. She has rales.  Rales noted in the right base.   Abdominal: Soft. Bowel sounds are normal. She exhibits no distension and no mass. There is no tenderness. There is no rebound and no guarding.  Incision site to the RUQ is clean, dry and intact.   Musculoskeletal: Normal range of motion. She exhibits no edema or tenderness.  All compartments are soft. No palpable cords. Intact DP and PT pulses bilaterally.   Lymphadenopathy:    She has no cervical adenopathy.  Neurological: She is alert and oriented to person, place, and time. She has normal reflexes. She displays normal reflexes. She exhibits normal muscle tone.  Skin: Skin is warm and dry. Capillary refill takes less than 2 seconds.  Psychiatric: She has a normal mood and affect. Her behavior is normal.  Nursing note and vitals  reviewed.  ED Treatments / Results  DIAGNOSTIC STUDIES: Oxygen Saturation is 96% on RA, normal by my interpretation.   COORDINATION OF CARE: 12:34 AM-Discussed next steps with pt. Pt verbalized understanding and is agreeable with the plan.   Labs (all labs ordered are listed, but only abnormal results are displayed) Labs Reviewed  CBC WITH DIFFERENTIAL/PLATELET - Abnormal; Notable for the following:       Result Value   WBC 16.4 (*)    RBC 3.86 (*)    Hemoglobin 11.2 (*)    HCT 34.4 (*)    Neutro Abs 14.9 (*)    All other components within normal limits  COMPREHENSIVE METABOLIC PANEL - Abnormal; Notable for the following:    Chloride 99 (*)    Glucose, Bld 123 (*)    ALT 13 (*)    All other components within normal limits  CULTURE, BLOOD (ROUTINE X 2)  CULTURE, BLOOD (ROUTINE X 2)  LIPASE, BLOOD  URINALYSIS, ROUTINE W REFLEX MICROSCOPIC (NOT AT [facility])  Randolm Idol, ED   EKG  EKG Interpretation  Date/Time:  Friday August 03 2016 21:25:43 EDT Ventricular Rate:  95 PR Interval:  168 QRS Duration: 86 QT Interval:  336 QTC Calculation: 422 R Axis:   51 Text Interpretation:  Normal sinus rhythm Confirmed by [facility]  MD, Althia Egolf ([id number]) on 08/04/2016 12:14:41 AM      Radiology Dg Chest 2 View  Result Date: 08/03/2016 CLINICAL DATA:  Right ribcage pain with shortness of breath and emesis EXAM: CHEST  2 VIEW COMPARISON:  06/16/2016 FINDINGS: Right diaphragm is mildly elevated. Streaky and hazy atelectasis or infiltrate at the right base. No large effusion. Heart size stable. No pneumothorax. Partially visualized lower cervical  hardware. Posterior stabilization rod and screw fixation of the spine. There is a drain in the right upper quadrant. IMPRESSION: Mildly elevated right diaphragm. Hazy atelectasis or infiltrate at the right lung base. Electronically Signed   By: Donavan Foil M.D.   On: 08/03/2016 22:01   Procedures Procedures  Results for orders placed or  performed during the hospital encounter of 08/04/16  CBC with Differential  Result Value Ref Range   WBC 16.4 (H) 4.0 - 10.5 K/uL   RBC 3.86 (L) 3.87 - 5.11 MIL/uL   Hemoglobin 11.2 (L) 12.0 - 15.0 g/dL   HCT 34.4 (L) 36.0 - 46.0 %   MCV 89.1 78.0 - 100.0 fL   MCH 29.0 26.0 - 34.0 pg   MCHC 32.6 30.0 - 36.0 g/dL   RDW 14.8 11.5 - 15.5 %   Platelets 197 150 - 400 K/uL   Neutrophils Relative % 91 %   Neutro Abs 14.9 (H) 1.7 - 7.7 K/uL   Lymphocytes Relative 5 %   Lymphs Abs 0.8 0.7 - 4.0 K/uL   Monocytes Relative 3 %   Monocytes Absolute 0.6 0.1 - 1.0 K/uL   Eosinophils Relative 1 %   Eosinophils Absolute 0.1 0.0 - 0.7 K/uL   Basophils Relative 0 %   Basophils Absolute 0.0 0.0 - 0.1 K/uL  Comprehensive metabolic panel  Result Value Ref Range   Sodium 135 135 - 145 mmol/L   Potassium 4.1 3.5 - 5.1 mmol/L   Chloride 99 (L) 101 - 111 mmol/L   CO2 24 22 - 32 mmol/L   Glucose, Bld 123 (H) 65 - 99 mg/dL   BUN 12 6 - 20 mg/dL   Creatinine, Ser 0.78 0.44 - 1.00 mg/dL   Calcium 9.8 8.9 - 10.3 mg/dL   Total Protein 7.1 6.5 - 8.1 g/dL   Albumin 4.4 3.5 - 5.0 g/dL   AST 23 15 - 41 U/L   ALT 13 (L) 14 - 54 U/L   Alkaline Phosphatase 81 38 - 126 U/L   Total Bilirubin 0.6 0.3 - 1.2 mg/dL   GFR calc non Af Amer >60 >60 mL/min   GFR calc Af Amer >60 >60 mL/min   Anion gap 12 5 - 15  Lipase, blood  Result Value Ref Range   Lipase 24 11 - 51 U/L  I-Stat Troponin, ED (not at [facility])  Result Value Ref Range   Troponin i, poc 0.00 0.00 - 0.08 ng/mL   Comment 3           Dg Chest 2 View  Result Date: 08/03/2016 CLINICAL DATA:  Right ribcage pain with shortness of breath and emesis EXAM: CHEST  2 VIEW COMPARISON:  06/16/2016 FINDINGS: Right diaphragm is mildly elevated. Streaky and hazy atelectasis or infiltrate at the right base. No large effusion. Heart size stable. No pneumothorax. Partially visualized lower cervical hardware. Posterior stabilization rod and screw fixation of the spine.  There is a drain in the right upper quadrant. IMPRESSION: Mildly elevated right diaphragm. Hazy atelectasis or infiltrate at the right lung base. Electronically Signed   By: Donavan Foil M.D.   On: 08/03/2016 22:01   Dg Sinus/fist Tube Chk-non Gi  Result Date: 07/17/2016 INDICATION: 70 year old female with a history of percutaneous cholecystostomy EXAM: CHOLECYSTOSTOMY TUBE INJECTION MEDICATIONS: NONE ANESTHESIA/SEDATION: NONE COMPLICATIONS: NONE PROCEDURE: Informed written consent was obtained from the patient after a thorough discussion of the procedural risks, benefits and alternatives. All questions were addressed. Personal protective gear was used. A timeout  was performed prior to the initiation of the procedure. Patient is positioned supine position. A through the tube cholangiogram of percutaneous cholecystostomy was performed. The tube was then attached to gravity drainage. FINDINGS: Through the tube cholangiogram demonstrates contracted gallbladder, with patent cystic duct and common bile duct. Contrast entered the duodenum. Mild reflux into the pancreatic duct. IMPRESSION: Status post through the tube cholangiogram of percutaneous cholecystostomy demonstrating a patent cystic duct and common bile duct. The study is nondiagnostic for identification of residual cholelithiasis. If demonstrating volume of potential cholelithiasis will change treatment plan, a repeat CT would be indicated. Signed, Dulcy Fanny. Earleen Newport, DO [facility] Electronically Signed   By: Corrie Mckusick D.O.   On: 07/17/2016 15:22   US Abdomen Limited Ruq  Result Date: 07/25/2016 CLINICAL DATA:  Cholecystitis.  History of cholecystostomy. EXAM: US ABDOMEN LIMITED - RIGHT UPPER QUADRANT COMPARISON:  Cholecystostomy images 07/17/2016. Ultrasound 06/04/2016. CT 08/14/ 2017. FINDINGS: Gallbladder: The gallbladder is contracted. Sludge is noted in the gallbladder. Gallbladder wall  thickness 2.3 mm. Negative Murphy sign. Small amount of pericholecystic fluid. Common bile duct: Diameter: 6.3 mm Liver: 6.6 x 5.0 x 5.2 cm heterogeneous mass central portion of the liver. As noted on prior CT of 06/04/2016, given its CT characteristics this is most likely a hemangioma. No change. IMPRESSION: 1. The gallbladder is contracted. Sludge is noted in the gallbladder. Small amount of pericholecystic fluid. 2. Stable appearance of 6.6 cm mass right hepatic lobe as noted on prior CT of 06/04/2016. This is most likely a hemangioma given CT characteristics. Electronically Signed   By: [name]  Register   On: 07/25/2016 11:41   Medications Ordered in ED Medications  0.9 %  sodium chloride infusion ( Intravenous New Bag/Given 08/04/16 0054)  vancomycin (VANCOCIN) IVPB 1000 mg/200 mL premix (1,000 mg Intravenous New Bag/Given 08/04/16 0102)  piperacillin-tazobactam (ZOSYN) IVPB 3.375 g (3.375 g Intravenous New Bag/Given 08/04/16 0101)  ondansetron (ZOFRAN) injection 4 mg (4 mg Intravenous Given 08/04/16 0055)    EKG Interpretation  Date/Time:  Friday August 03 2016 21:25:43 EDT Ventricular Rate:  95 PR Interval:  168 QRS Duration: 86 QT Interval:  336 QTC Calculation: 422 R Axis:   51 Text Interpretation:  Normal sinus rhythm Confirmed by [facility]  MD, Emmaline Kluver ([id number]) on 08/04/2016 12:14:41 AM        Initial Impression / Assessment and Plan / ED Course  I have reviewed the triage vital signs and the nursing notes.  Pertinent labs & imaging results that were available during my care of the patient were reviewed by me and considered in my medical decision making (see chart for details). 1:13 AM-Per Dr Hal Hope pt is in-patient Tele Bed 1.   Final Clinical Impressions(s) / ED Diagnoses   Final diagnoses:  None   New Prescriptions New Prescriptions   No medications on file  Will treat for HCAP and admit I personally performed the services described in this documentation,  which was scribed in my presence. The recorded information has been reviewed and is accurate.       Veatrice Kells, MD 08/04/16 [phone]

## 2016-08-04 NOTE — H&P (Signed)
History and Physical    Latoya [name] [id number] DOB: 03-08-1946 DOA: 08/04/2016  PCP: Thressa Sheller, MD  Patient coming from: Home.  Chief Complaint: Not feeling well.  HPI: Latoya [name] is a 70 y.o. female with paroxysmal atrial fibrillation, moderate CAD, hypertension, asthma, hypothyroidism, hyperlipidemia, antiphospholipid syndrome who was admitted in August 2017 for sepsis secondary to ascending cholangitis and is on biliary drain presents to the ER after patient had 3-4 episodes of nausea vomiting after dinner yesterday and right-sided pleuritic chest pain and patient not feeling well. In the ER x-ray was showing features concerning for pneumonia and lab work showed leukocytosis. Patient states at home, patient had recorded a temperature of 103F. Denies any diarrhea. Patient has a biliary drain from recent cholangitis. Has been followed up by Dr. Brantley Stage, general surgeon. Patient is being admitted for further management of healthcare associated pneumonia and nausea vomiting.   ED Course: Patient was started on vancomycin and cefepime for healthcare associated pneumonia.  Review of Systems: As per HPI, rest all negative.   Past Medical History:  Diagnosis Date  . Anemia, iron deficiency   . Antiphospholipid syndrome (HCC)    with hypercoagulable state  . Asthma    extrinsic; moderate, persistant, nml spirometry 2010, nml CXR 1/08  . Bladder troubles    REPORTS INFECTIONS ON OCCASION DUE TO URETHRA MEATUS STRICTURE AT BIRTH   . Blood dyscrasia    antiphospholipid disorder  . CAD (coronary artery disease)    50% mid LAD, 80% ostial D1 and moderate 80% mid circ by vath 2003  . Cancer ([place])    basal cell removed fr. L arm   . Complication of anesthesia    cardiac arrest- in OR, at age 2 (14)y.o. during Scalenotomy in her early 20's  . Coronary heart disease   . Drug allergy    heparin/lovenox  . DVT (deep venous thrombosis) ([place])   . Erosive gastritis 1994  .  Family history of adverse reaction to anesthesia    some family members have had trouble waking up  . GERD (gastroesophageal reflux disease)   . H/O hiatal hernia   . HLD (hyperlipidemia)   . HTN (hypertension)    [name], manages pt.  LOV 03/2014  . Hypothyroidism   . Liver spot    cyst- - no biopsy, but told that its benign   . PAF (paroxysmal atrial fibrillation) (HCC)    not on coumadin therapy  . Pulmonary embolism ([place])    related to back surgery with prior coumadin use, now off  . Spondylosis, lumbosacral    ARTHRITIS- OA     Past Surgical History:  Procedure Laterality Date  . ABDOMINAL HYSTERECTOMY     partial abdominal -1998  . BACK SURGERY     x13 cervical and lumbar thoracic spine surgery  . BREAST ENHANCEMENT SURGERY    . BREAST SURGERY     all benign cysts x3   . CARDIAC CATHETERIZATION     2005  . cleft lip and palate repair     70 yo   . hysterectomy    . IR GENERIC HISTORICAL  06/04/2016   IR PERC CHOLECYSTOSTOMY 06/04/2016 Greggory Keen, MD [facility]  . JOINT REPLACEMENT    . POSTERIOR CERVICAL FUSION/FORAMINOTOMY Right 08/31/2014   Procedure: Right cervical six-seven foraminotomy, Cervical five-Thoracic one fusion, Cervical six-seven lateral mass screws;  Surgeon: Erline Levine, MD;  Location: [facility];  Service: Neurosurgery;  Laterality: Right;  . spina  bifida repair     70 yo   . TONSILLECTOMY    . TOTAL KNEE ARTHROPLASTY     left     reports that she has never smoked. She has never used smokeless tobacco. She reports that she does not drink alcohol or use drugs.  Allergies  Allergen Reactions  . Enoxaparin Sodium Anaphylaxis    REACTION: thrombocytopenia  . Heparin Anaphylaxis    REACTION: thrombocytopenia  . [place] Venom Anaphylaxis  . Other Anaphylaxis    Sensitive to dye in Betadine & Chlorohexadine   . Pork-Derived Products Anaphylaxis  . Chlorhexidine Itching    Reports that it is the dye in it  . Lactose Intolerance  (Gi) Diarrhea and Nausea And Vomiting    Can tolerate most [place] cheeses (Takes Lactaid)  . Indomethacin Rash  . Phenylpropanolamine Hypertension  . Povidone Iodine Rash  . Povidone-Iodine Itching and Rash    Sensitivity- but if its wiped off she is able to tolerate betadine     Family History  Problem Relation Age of Onset  . Heart disease Father     MI   . Heart disease Sister     Prior to Admission medications   Medication Sig Start Date End Date Taking? Authorizing Provider  albuterol (PROVENTIL HFA;VENTOLIN HFA) 108 (90 BASE) MCG/ACT inhaler Inhale 2 puffs into the lungs every 6 (six) hours as needed for wheezing or shortness of breath.    Yes Historical Provider, MD  aspirin 81 MG EC tablet Take 81 mg by mouth daily.    Yes Historical Provider, MD  atorvastatin (LIPITOR) 40 MG tablet Take 40 mg by mouth daily.   Yes Historical Provider, MD  azelastine (ASTELIN) 137 MCG/SPRAY nasal spray Place 1 spray into the nose 2 (two) times daily.    Yes Historical Provider, MD  beclomethasone (QVAR) 80 MCG/ACT inhaler Inhale 2 puffs into the lungs 2 (two) times daily. Patient taking differently: Inhale 2 puffs into the lungs See admin instructions. Two to three times a day 06/24/14  Yes Elsie Stain, MD  Calcium Carb-Cholecalciferol (CALCIUM 500 +D) 500-400 MG-UNIT TABS Take 1 tablet by mouth 2 (two) times daily.   Yes Historical Provider, MD  diazepam (VALIUM) 5 MG tablet Take 5 mg by mouth at bedtime as needed for sleep. 08/03/16  Yes Historical Provider, MD  docusate sodium (COLACE) 100 MG capsule Take 200 mg by mouth 2 (two) times daily.    Yes Historical Provider, MD  EPINEPHrine (EPIPEN) 0.3 mg/0.3 mL DEVI Inject 0.3 mLs (0.3 mg total) into the muscle once. Patient taking differently: Inject 0.3 mg into the muscle once as needed.  04/12/13  Yes Christina P Rama, MD  esomeprazole (NEXIUM) 40 MG capsule Take 40 mg by mouth 2 (two) times daily.    Yes Historical Provider, MD  fentaNYL  (DURAGESIC - DOSED MCG/HR) 12 MCG/HR Place 12 mcg onto the skin every 3 (three) days. IN CONJUNCTION WITH ONE 50 MCG PATCH TO EQUAL A TOTAL OF 62 MICROGRAMS 08/01/16  Yes Historical Provider, MD  fentaNYL (DURAGESIC - DOSED MCG/HR) 50 MCG/HR Place 50 mcg onto the skin every 3 (three) days. IN CONJUNCTION WITH ONE 12 MCG PATCH TO EQUAL A TOTAL OF 62 MICROGRAMS 08/03/16  Yes Historical Provider, MD  gabapentin (NEURONTIN) 600 MG tablet Take 300 mg by mouth at bedtime. 08/03/16  Yes Historical Provider, MD  Glucosamine-Chondroitin 750-600 MG TABS Take 2 tablets by mouth 2 (two) times daily.    Yes Historical Provider, MD  HYDROmorphone (DILAUDID) 4 MG tablet Take 1 tablet (4 mg total) by mouth every 4 (four) hours as needed. For pain Patient taking differently: Take 4-8 mg by mouth every 4 (four) hours as needed (for pain).  06/21/16  Yes Verlee Monte, MD  levothyroxine (SYNTHROID, LEVOTHROID) 75 MCG tablet Take 75 mcg by mouth daily before breakfast.    Yes Historical Provider, MD  lidocaine (LIDODERM) 5 % Place 1 patch onto the skin daily. Remove & Discard patch within 12 hours or as directed by MD   Yes Historical Provider, MD  Loratadine-Pseudoephedrine (CLARITIN-D 12 HOUR PO) Take 1 tablet by mouth 2 (two) times daily.    Yes Historical Provider, MD  metaxalone (SKELAXIN) 800 MG tablet Take 800 mg by mouth every 8 (eight) hours as needed. For muscle pain   Yes Historical Provider, MD  metoprolol (LOPRESSOR) 50 MG tablet Take 1 tablet (50 mg total) by mouth 2 (two) times daily. 11/09/15  Yes Burtis Junes, NP  montelukast (SINGULAIR) 10 MG tablet Take 10 mg by mouth at bedtime.    Yes Historical Provider, MD  Multiple Vitamin (MULTIVITAMIN WITH MINERALS) TABS Take 1 tablet by mouth daily.   Yes Historical Provider, MD  NITROSTAT 0.4 MG SL tablet DISSOLVE 1 TABLET UNDER TONGUE AS NEEDED FOR CHEST PAIN,MAY REPEAT IN5 MINUTES FOR 2 DOSES. 02/24/16  Yes Burnell Blanks, MD  ondansetron (ZOFRAN) 4 MG  tablet Take 4 mg by mouth every 8 (eight) hours as needed for nausea/vomiting. 07/21/16  Yes Historical Provider, MD  Respiratory Therapy Supplies (FLUTTER) DEVI Use as directed 06/06/15  Yes Tanda Rockers, MD  triamcinolone (NASACORT) 55 MCG/ACT nasal inhaler Place 1-2 sprays into the nose 2 (two) times daily.    Yes Historical Provider, MD  XARELTO 20 MG TABS tablet Take 20 mg by mouth daily after breakfast. 07/24/16  Yes Historical Provider, MD  amiodarone (PACERONE) 200 MG tablet Take 1 tablet (200 mg total) by mouth daily. Patient not taking: Reported on 08/04/2016 06/21/16   Verlee Monte, MD  atorvastatin (LIPITOR) 20 MG tablet Take 1 tablet (20 mg total) by mouth daily. Patient not taking: Reported on 08/04/2016 11/09/15   Burtis Junes, NP  FentaNYL 37.5 MCG/HR PT72 Place 1 patch onto the skin every 3 (three) days. Patient not taking: Reported on 08/04/2016 06/21/16   Verlee Monte, MD  Rivaroxaban (XARELTO) 15 MG TABS tablet Take 1 tablet (15 mg total) by mouth daily with supper. Patient not taking: Reported on 08/04/2016 06/21/16   Verlee Monte, MD    Physical Exam: Vitals:   08/03/16 2115 08/03/16 2352 08/04/16 0030  BP: 150/75 140/59 135/62  Pulse: 92 96 101  Resp: 16 16 12   Temp: 98.3 F (36.8 C) 99.3 F (37.4 C)   TempSrc: Oral Oral   SpO2: 97% 100% 95%      Constitutional: Moderately built and nourished. Vitals:   08/03/16 2115 08/03/16 2352 08/04/16 0030  BP: 150/75 140/59 135/62  Pulse: 92 96 101  Resp: 16 16 12   Temp: 98.3 F (36.8 C) 99.3 F (37.4 C)   TempSrc: Oral Oral   SpO2: 97% 100% 95%   Eyes: Anicteric no pallor. ENMT: No discharge from the ears eyes nose or mouth. Neck: No mass felt. No JVD appreciated. Respiratory: No rhonchi or crepitations. Cardiovascular: S1 and S2 heard. Abdomen: Biliary drain seen. Soft nontender bowel sounds present. Musculoskeletal: No edema. No joint effusion. Skin: No rash. Skin appears warm. Neurologic: Alert awake  oriented to  time place and person. Moves all extremities. Psychiatric: Appears normal. Normal affect.   Labs on Admission: I have personally reviewed following labs and imaging studies  CBC:  Recent Labs Lab 08/03/16 2213  WBC 16.4*  NEUTROABS 14.9*  HGB 11.2*  HCT 34.4*  MCV 89.1  PLT [id number]   Basic Metabolic Panel:  Recent Labs Lab 08/03/16 2213  NA 135  K 4.1  CL 99*  CO2 24  GLUCOSE 123*  BUN 12  CREATININE 0.78  CALCIUM 9.8   GFR: CrCl cannot be calculated (Unknown ideal weight.). Liver Function Tests:  Recent Labs Lab 08/03/16 2213  AST 23  ALT 13*  ALKPHOS 81  BILITOT 0.6  PROT 7.1  ALBUMIN 4.4    Recent Labs Lab 08/03/16 2213  LIPASE 24   No results for input(s): AMMONIA in the last 168 hours. Coagulation Profile: No results for input(s): INR, PROTIME in the last 168 hours. Cardiac Enzymes: No results for input(s): CKTOTAL, CKMB, CKMBINDEX, TROPONINI in the last 168 hours. BNP (last 3 results) No results for input(s): PROBNP in the last 8760 hours. HbA1C: No results for input(s): HGBA1C in the last 72 hours. CBG: No results for input(s): GLUCAP in the last 168 hours. Lipid Profile: No results for input(s): CHOL, HDL, LDLCALC, TRIG, CHOLHDL, LDLDIRECT in the last 72 hours. Thyroid Function Tests: No results for input(s): TSH, T4TOTAL, FREET4, T3FREE, THYROIDAB in the last 72 hours. Anemia Panel: No results for input(s): VITAMINB12, FOLATE, FERRITIN, TIBC, IRON, RETICCTPCT in the last 72 hours. Urine analysis:    Component Value Date/Time   COLORURINE AMBER (A) 08/04/2016 0130   APPEARANCEUR CLOUDY (A) 08/04/2016 0130   LABSPEC 1.019 08/04/2016 0130   PHURINE 5.0 08/04/2016 0130   GLUCOSEU NEGATIVE 08/04/2016 0130   HGBUR SMALL (A) 08/04/2016 0130   BILIRUBINUR NEGATIVE 08/04/2016 0130   BILIRUBINUR neg 02/08/2014 1955   KETONESUR NEGATIVE 08/04/2016 0130   PROTEINUR NEGATIVE 08/04/2016 0130   UROBILINOGEN 0.2 02/08/2014 1955    UROBILINOGEN 2.0 (H) 02/06/2009 1620   NITRITE NEGATIVE 08/04/2016 0130   LEUKOCYTESUR SMALL (A) 08/04/2016 0130   Sepsis Labs: @LABRCNTIP (procalcitonin:4,lacticidven:4) )No results found for this or any previous visit (from the past 240 hour(s)).   Radiological Exams on Admission: Dg Chest 2 View  Result Date: 08/03/2016 CLINICAL DATA:  Right ribcage pain with shortness of breath and emesis EXAM: CHEST  2 VIEW COMPARISON:  06/16/2016 FINDINGS: Right diaphragm is mildly elevated. Streaky and hazy atelectasis or infiltrate at the right base. No large effusion. Heart size stable. No pneumothorax. Partially visualized lower cervical hardware. Posterior stabilization rod and screw fixation of the spine. There is a drain in the right upper quadrant. IMPRESSION: Mildly elevated right diaphragm. Hazy atelectasis or infiltrate at the right lung base. Electronically Signed   By: Donavan Foil M.D.   On: 08/03/2016 22:01    EKG: Independently reviewed. Normal sinus rhythm.  Assessment/Plan Principal Problem:   Healthcare-associated pneumonia Active Problems:   Primary hypercoagulable state ([place])   Essential hypertension   CAD, NATIVE VESSEL   PAROXYSMAL ATRIAL FIBRILLATION   Asthma   PAD (peripheral artery disease) (HCC)   Pneumonia    1. Healthcare associated pneumonia - patient is on vancomycin and cefepime. Chest x-ray is concerning for pneumonia and patient also has a right-sided pleuritic chest pain. Patient could also have aspirated after nausea vomiting. Closely observe. 2. Nausea vomiting with recent admission for sepsis from ascending cholangitis with biliary drain - LFTs normal and abdomen appears benign.  Patient does have a right-sided pleuritic chest pain. If nausea vomiting persist will need surgery input. Closely follow LFTs. Has had recent sonogram 10 days ago which did not show any obstruction. 3. Paroxysmal atrial fibrillation - presently in sinus rhythm. Chest 2 vasc score is  more than 2. On Xarelto, amiodarone and beta blockers. 4. Asthma - presently not wheezing. 5. Hypothyroidism on Synthroid. 6. Moderate CAD - has right-sided pleuritic chest pain. More likely from pneumonia or gallbladder related. 7. Chronic anemia - follow CBC. 8. Antiphospholipid syndrome - on Xarelto. 9. Hypertension - continue metoprolol.   DVT prophylaxis: Xarelto. Code Status: Full code.  Family Communication: Discussed with patient.  Disposition Plan: Home.  Consults called: None.  Admission status: Inpatient. Likely stay 2-3 days.    Rise Patience MD [facility] Pager [phone].  If 7PM-7AM, please contact night-coverage [email or web address] Password [email or web address]  08/04/2016, 3:01 AM

## 2016-08-05 LAB — CBC WITH DIFFERENTIAL/PLATELET
BLASTS: 0 %
Band Neutrophils: 0 %
Basophils Absolute: 0 10*3/uL (ref 0.0–0.1)
Basophils Relative: 0 %
EOS PCT: 1 %
Eosinophils Absolute: 0.1 10*3/uL (ref 0.0–0.7)
HEMATOCRIT: 27.2 % — AB (ref 36.0–46.0)
Hemoglobin: 8.8 g/dL — ABNORMAL LOW (ref 12.0–15.0)
LYMPHS ABS: 0.6 10*3/uL — AB (ref 0.7–4.0)
Lymphocytes Relative: 7 %
MCH: 28.6 pg (ref 26.0–34.0)
MCHC: 32.4 g/dL (ref 30.0–36.0)
MCV: 88.3 fL (ref 78.0–100.0)
MONOS PCT: 2 %
Metamyelocytes Relative: 0 %
Monocytes Absolute: 0.2 10*3/uL (ref 0.1–1.0)
Myelocytes: 0 %
NEUTROS ABS: 8 10*3/uL — AB (ref 1.7–7.7)
NEUTROS PCT: 90 %
NRBC: 0 /100{WBCs}
Other: 0 %
PLATELETS: 128 10*3/uL — AB (ref 150–400)
Promyelocytes Absolute: 0 %
RBC: 3.08 MIL/uL — AB (ref 3.87–5.11)
RDW: 14.8 % (ref 11.5–15.5)
WBC: 8.9 10*3/uL (ref 4.0–10.5)

## 2016-08-05 LAB — COMPREHENSIVE METABOLIC PANEL
ALT: 13 U/L — ABNORMAL LOW (ref 14–54)
ANION GAP: 7 (ref 5–15)
AST: 14 U/L — ABNORMAL LOW (ref 15–41)
Albumin: 2.9 g/dL — ABNORMAL LOW (ref 3.5–5.0)
Alkaline Phosphatase: 55 U/L (ref 38–126)
BUN: 9 mg/dL (ref 6–20)
CHLORIDE: 109 mmol/L (ref 101–111)
CO2: 24 mmol/L (ref 22–32)
Calcium: 8.7 mg/dL — ABNORMAL LOW (ref 8.9–10.3)
Creatinine, Ser: 0.66 mg/dL (ref 0.44–1.00)
Glucose, Bld: 95 mg/dL (ref 65–99)
POTASSIUM: 3.3 mmol/L — AB (ref 3.5–5.1)
Sodium: 140 mmol/L (ref 135–145)
Total Bilirubin: 0.7 mg/dL (ref 0.3–1.2)
Total Protein: 5.3 g/dL — ABNORMAL LOW (ref 6.5–8.1)

## 2016-08-05 MED ORDER — POTASSIUM CHLORIDE 10 MEQ/100ML IV SOLN
10.0000 meq | INTRAVENOUS | Status: AC
Start: 2016-08-05 — End: 2016-08-05
  Administered 2016-08-05 (×6): 10 meq via INTRAVENOUS
  Filled 2016-08-05 (×4): qty 100

## 2016-08-05 MED ORDER — POTASSIUM CHLORIDE 10 MEQ/100ML IV SOLN
INTRAVENOUS | Status: AC
  Administered 2016-08-05: 10 meq
  Filled 2016-08-05: qty 100

## 2016-08-05 MED ORDER — POTASSIUM CHLORIDE 10 MEQ/100ML IV SOLN
INTRAVENOUS | Status: AC
  Filled 2016-08-05: qty 100

## 2016-08-05 MED ORDER — HYDROMORPHONE HCL 2 MG PO TABS
2.0000 mg | ORAL_TABLET | Freq: Four times a day (QID) | ORAL | Status: DC | PRN
  Administered 2016-08-05 – 2016-08-06 (×2): 4 mg via ORAL
  Filled 2016-08-05 (×2): qty 2

## 2016-08-05 NOTE — Progress Notes (Signed)
Patient states has hematoma from phlebotomy attempt in left antecubital area.  Area observed to be swollen / indurated.   No active bleeding at site.  Pressure applied for 10 minutes and ice applied.  Pt. States area feels better.  Will monitor.

## 2016-08-05 NOTE — Progress Notes (Signed)
Progress Note: General Surgery Service   Subjective: Pain in right ribs, no nausea or vomiting currently  Objective: Vital signs in last 24 hours: Temp:  [97.7 F (36.5 C)-99.1 F (37.3 C)] 99.1 F (37.3 C) (10/15 0932) Pulse Rate:  [75-92] 77 (10/15 0932) Resp:  [16-18] 16 (10/15 0932) BP: (110-141)/(46-55) 128/48 (10/15 0932) SpO2:  [96 %-100 %] 98 % (10/15 0932) Last BM Date: 08/03/16  Intake/Output from previous day: 10/14 0701 - 10/15 0700 In: 1618.3 [P.O.:530; I.V.:1038.3; IV Piggyback:50] Out: 2000 [Urine:1650; Drains:350] Intake/Output this shift: No intake/output data recorded.  Abd: soft, drain in place with bilious drainage  Extremities: no edema  Neuro: AOx4  Lab Results: CBC   Recent Labs  08/03/16 2213 08/04/16 0317  WBC 16.4* 19.9*  HGB 11.2* 9.5*  HCT 34.4* 28.6*  PLT 197 165   BMET  Recent Labs  08/04/16 0317 08/05/16 0545  NA 135 140  K 3.8 3.3*  CL 102 109  CO2 23 24  GLUCOSE 108* 95  BUN 11 9  CREATININE 0.69 0.66  CALCIUM 9.0 8.7*   PT/INR No results for input(s): LABPROT, INR in the last 72 hours. ABG No results for input(s): PHART, HCO3 in the last 72 hours.  Invalid input(s): PCO2, PO2  Studies/Results:  Anti-infectives: Anti-infectives    Start     Dose/Rate Route Frequency Ordered Stop   08/04/16 1000  vancomycin (VANCOCIN) IVPB 1000 mg/200 mL premix     1,000 mg 200 mL/hr over 60 Minutes Intravenous Every 12 hours 08/04/16 0623     08/04/16 0800  ceFEPIme (MAXIPIME) 1 g in dextrose 5 % 50 mL IVPB     1 g 100 mL/hr over 30 Minutes Intravenous Every 8 hours 08/04/16 0623     08/04/16 0100  vancomycin (VANCOCIN) IVPB 1000 mg/200 mL premix     1,000 mg 200 mL/hr over 60 Minutes Intravenous  Once 08/04/16 0051 08/04/16 0254   08/04/16 0100  piperacillin-tazobactam (ZOSYN) IVPB 3.375 g     3.375 g 100 mL/hr over 30 Minutes Intravenous  Once 08/04/16 0051 08/04/16 0255      Medications: Scheduled Meds: .  amiodarone  200 mg Oral Daily  . aspirin EC  81 mg Oral Daily  . atorvastatin  40 mg Oral Daily  . azelastine  1 spray Each Nare BID  . budesonide (PULMICORT) nebulizer solution  0.5 mg Nebulization BID  . calcium-vitamin D  1 tablet Oral BID  . ceFEPime (MAXIPIME) IV  1 g Intravenous Q8H  . docusate sodium  200 mg Oral BID  . fentaNYL  50 mcg Transdermal Q72H  . gabapentin  300 mg Oral QHS  . levothyroxine  75 mcg Oral QAC breakfast  . metoprolol  50 mg Oral BID  . montelukast  10 mg Oral QHS  . multivitamin with minerals  1 tablet Oral Daily  . pantoprazole  40 mg Oral Daily  . potassium chloride  10 mEq Intravenous Q1 Hr x 6  . rivaroxaban  20 mg Oral Q breakfast  . vancomycin  1,000 mg Intravenous Q12H   Continuous Infusions:  PRN Meds:.acetaminophen **OR** acetaminophen, albuterol, diazepam, magic mouthwash, metaxalone, nitroGLYCERIN, nystatin, ondansetron **OR** ondansetron (ZOFRAN) IV  Assessment/Plan: Patient Active Problem List   Diagnosis Date Noted  . Healthcare-associated pneumonia 08/04/2016  . Leukocytosis 08/04/2016  . Nausea with vomiting 08/04/2016  . Hypothyroidism 08/04/2016  . Anemia of chronic disease 08/04/2016  . Cholangitis   . Gastroparesis   . Acute respiratory failure ([place])   .  Enterococcal bacteremia 06/06/2016  . Bacteremia due to Klebsiella pneumoniae 06/06/2016  . Bacteremia due to Escherichia coli 06/06/2016  . Acute respiratory failure with hypoxia ([place])   . Ascending cholangitis 06/04/2016  . Hemangioma of liver 06/04/2016  . Cervical pseudoarthrosis ([place]) 08/31/2014  . Anaphylaxis, mild, due to wasp envenomation 04/12/2013  . Chest pain, atypical 04/11/2013  . PAD (peripheral artery disease) ([place]) 06/20/2011  . ANEMIA, IRON DEFICIENCY, MICROCYTIC 06/01/2010  . Primary hypercoagulable state ([place]) 06/01/2010  . BACK PAIN, CHRONIC 06/01/2010  . ABDOMINAL PAIN -GENERALIZED 06/01/2010  . CAROTID ARTERY DISEASE 05/25/2010  . CAD, NATIVE  VESSEL 11/15/2009  . MURMUR 11/15/2009  . CAROTID BRUIT, LEFT 11/15/2009  . Essential hypertension 03/18/2009  . History of pulmonary embolism 03/18/2009  . PAROXYSMAL ATRIAL FIBRILLATION 03/18/2009  . DVT 03/18/2009  . Asthma 03/18/2009  . GERD 03/18/2009  . SPONDYLOSIS, LUMBAR 03/18/2009  . HYPERLIPIDEMIA 11/29/2008  . CORONARY HEART DISEASE 11/29/2008  cholecystitis s/p cholecystostomy tube placed in 05/2016 now back with leukocytosis, drain contents without signs of purulence. -tube fluoro check today -continue NPO sips ok -empiric abx -will continue to follow   LOS: 1 day   Mickeal Skinner, MD Pg# [phone] [facility]

## 2016-08-05 NOTE — Progress Notes (Signed)
PROGRESS NOTE    Latoya [name]  [id number] DOB: 08/11/1946 DOA: 08/04/2016 PCP: Thressa Sheller, MD     Brief Narrative:  Latoya [name] is a 70 y.o. female with paroxysmal atrial fibrillation, moderate CAD, hypertension, asthma, hypothyroidism, hyperlipidemia, antiphospholipid syndrome who was admitted in August 2017 for sepsis secondary to ascending cholangitis and is on biliary drain presents to the ER after patient had 3-4 episodes of nausea vomiting after dinner yesterday and right-sided pleuritic chest pain and patient not feeling well. In the ER x-ray was showing features concerning for pneumonia and lab work showed leukocytosis. Patient states at home, patient had recorded a temperature of 103F. Denies any diarrhea. Patient has a biliary drain from recent cholangitis. Has been followed up by Dr. Brantley Stage, general surgeon. Patient is being admitted for further management of healthcare associated pneumonia (?) and nausea vomiting.   Assessment & Plan:   Principal Problem:   Leukocytosis Active Problems:   Primary hypercoagulable state ([place])   Essential hypertension   CAD, NATIVE VESSEL   PAROXYSMAL ATRIAL FIBRILLATION   Nausea with vomiting   Hypothyroidism   Anemia of chronic disease   Leukocytosis, nausea, vomiting  -Unclear etiology. Admitted with presumption of HCAP but denies any shortness of breath or cough. Recent ascending cholangitis with biliary drain in place. Follows with Dr. Brantley Stage, general surgeon -Strep pneumo Ag negative  -Legionella Ag pending  -Blood cultures pending  -Continue empiric Vanco and cefepime -Influenza negative  -IVF  -General surgery following; tube fluoro check ordered and spoke with radiology. This will be done tomorrow.   Paroxysmal atrial fibrillation -CHADSVASc 3 -Continue Xarelto, amiodarone, lopressor   Hypothyroidism -Continue Synthroid  Chronic normocytic anemia -Baseline Hgb 8-9 -Stable   Essential HTN -Well  controlled  -Continue lopressor   CAD -Continue Asa, lipitor   Asymptomatic pyuria -Not UTI. Monitor for symptoms    DVT prophylaxis: xarelto Code Status: full Family Communication: no family at bedside  Disposition Plan: pending further work up and improvement   Consultants:   General Surgery   Procedures:   None  Antimicrobials:   Vanco 10/14 >>  Cefepime 10/14 >>    Subjective: Patient without any new complaints besides being hungry and generalized weakness. She admits to "soreness" around the biliary drain site but no other abdominal pain. No vomiting or diarrhea.   Objective: Vitals:   08/04/16 2106 08/05/16 0606 08/05/16 0902 08/05/16 0932  BP: (!) 132/46 (!) 141/55  (!) 128/48  Pulse: 92 75  77  Resp: 18 17  16   Temp: 97.7 F (36.5 C) 98.7 F (37.1 C)  99.1 F (37.3 C)  TempSrc: Oral Oral  Oral  SpO2: 100% 96% 97% 98%  Weight:      Height:        Intake/Output Summary (Last 24 hours) at 08/05/16 1514 Last data filed at 08/05/16 1220  Gross per 24 hour  Intake          1808.33 ml  Output             1375 ml  Net           433.33 ml   Filed Weights   08/04/16 0351  Weight: 72.5 kg (159 lb 13.3 oz)    Examination:  General exam: Appears calm and comfortable  Respiratory system: Respiratory effort normal. Crackles right lung base, no wheeze.  Cardiovascular system: S1 & S2 heard, RRR. No JVD, murmurs, rubs, gallops or clicks. No pedal edema. Gastrointestinal system: Abdomen is nondistended,  soft, minimally TTP RUQ, biliary drain in place draining Central nervous system: Alert and oriented. No focal neurological deficits. Extremities: Symmetric 5 x 5 power. Skin: No rashes, lesions or ulcers Psychiatry: Judgement and insight appear normal. Mood & affect appropriate.   Data Reviewed: I have personally reviewed following labs and imaging studies  CBC:  Recent Labs Lab 08/03/16 2213 08/04/16 0317 08/05/16 1339  WBC 16.4* 19.9* 8.9    NEUTROABS 14.9* 17.6* 8.0*  HGB 11.2* 9.5* 8.8*  HCT 34.4* 28.6* 27.2*  MCV 89.1 87.2 88.3  PLT 197 165 0000000*   Basic Metabolic Panel:  Recent Labs Lab 08/03/16 2213 08/04/16 0317 08/05/16 0545  NA 135 135 140  K 4.1 3.8 3.3*  CL 99* 102 109  CO2 24 23 24   GLUCOSE 123* 108* 95  BUN 12 11 9   CREATININE 0.78 0.69 0.66  CALCIUM 9.8 9.0 8.7*   GFR: Estimated Creatinine Clearance: 64.8 mL/min (by C-G formula based on SCr of 0.66 mg/dL). Liver Function Tests:  Recent Labs Lab 08/03/16 2213 08/04/16 0317 08/05/16 0545  AST 23 18 14*  ALT 13* 14 13*  ALKPHOS 81 65 55  BILITOT 0.6 0.8 0.7  PROT 7.1 5.8* 5.3*  ALBUMIN 4.4 3.5 2.9*    Recent Labs Lab 08/03/16 2213  LIPASE 24   No results for input(s): AMMONIA in the last 168 hours. Coagulation Profile: No results for input(s): INR, PROTIME in the last 168 hours. Cardiac Enzymes: No results for input(s): CKTOTAL, CKMB, CKMBINDEX, TROPONINI in the last 168 hours. BNP (last 3 results) No results for input(s): PROBNP in the last 8760 hours. HbA1C: No results for input(s): HGBA1C in the last 72 hours. CBG: No results for input(s): GLUCAP in the last 168 hours. Lipid Profile: No results for input(s): CHOL, HDL, LDLCALC, TRIG, CHOLHDL, LDLDIRECT in the last 72 hours. Thyroid Function Tests: No results for input(s): TSH, T4TOTAL, FREET4, T3FREE, THYROIDAB in the last 72 hours. Anemia Panel: No results for input(s): VITAMINB12, FOLATE, FERRITIN, TIBC, IRON, RETICCTPCT in the last 72 hours. Sepsis Labs:  Recent Labs Lab 08/04/16 0904  LATICACIDVEN 1.4    No results found for this or any previous visit (from the past 240 hour(s)).     Radiology Studies: Dg Chest 2 View  Result Date: 08/03/2016 CLINICAL DATA:  Right ribcage pain with shortness of breath and emesis EXAM: CHEST  2 VIEW COMPARISON:  06/16/2016 FINDINGS: Right diaphragm is mildly elevated. Streaky and hazy atelectasis or infiltrate at the right  base. No large effusion. Heart size stable. No pneumothorax. Partially visualized lower cervical hardware. Posterior stabilization rod and screw fixation of the spine. There is a drain in the right upper quadrant. IMPRESSION: Mildly elevated right diaphragm. Hazy atelectasis or infiltrate at the right lung base. Electronically Signed   By: Donavan Foil M.D.   On: 08/03/2016 22:01   CXR 10/13 was reviewed independently: Elevated right hemidiaphragm. Infiltrate at right base. No pleural effusion. No pneumothorax.  Scheduled Meds: . amiodarone  200 mg Oral Daily  . aspirin EC  81 mg Oral Daily  . atorvastatin  40 mg Oral Daily  . azelastine  1 spray Each Nare BID  . budesonide (PULMICORT) nebulizer solution  0.5 mg Nebulization BID  . calcium-vitamin D  1 tablet Oral BID  . ceFEPime (MAXIPIME) IV  1 g Intravenous Q8H  . docusate sodium  200 mg Oral BID  . fentaNYL  50 mcg Transdermal Q72H  . gabapentin  300 mg Oral QHS  .  levothyroxine  75 mcg Oral QAC breakfast  . metoprolol  50 mg Oral BID  . montelukast  10 mg Oral QHS  . multivitamin with minerals  1 tablet Oral Daily  . pantoprazole  40 mg Oral Daily  . rivaroxaban  20 mg Oral Q breakfast  . vancomycin  1,000 mg Intravenous Q12H   Continuous Infusions:     LOS: 1 day    Time spent: 90minutes   Dessa Phi, DO [facility] [email or web address] Password [email or web address] 08/05/2016, 3:14 PM

## 2016-08-06 ENCOUNTER — Encounter (HOSPITAL_COMMUNITY): Payer: Self-pay | Admitting: Interventional Radiology

## 2016-08-06 ENCOUNTER — Inpatient Hospital Stay (HOSPITAL_COMMUNITY): Payer: Medicare Other

## 2016-08-06 HISTORY — PX: IR GENERIC HISTORICAL: IMG1180011

## 2016-08-06 LAB — COMPREHENSIVE METABOLIC PANEL
ALBUMIN: 3.3 g/dL — AB (ref 3.5–5.0)
ALT: 12 U/L — AB (ref 14–54)
AST: 15 U/L (ref 15–41)
Alkaline Phosphatase: 57 U/L (ref 38–126)
Anion gap: 7 (ref 5–15)
BUN: <5 mg/dL — ABNORMAL LOW (ref 6–20)
CHLORIDE: 110 mmol/L (ref 101–111)
CO2: 25 mmol/L (ref 22–32)
CREATININE: 0.66 mg/dL (ref 0.44–1.00)
Calcium: 9.3 mg/dL (ref 8.9–10.3)
GFR calc non Af Amer: >60 mL/min (ref >60)
GLUCOSE: 89 mg/dL (ref 65–99)
Potassium: 3.9 mmol/L (ref 3.5–5.1)
SODIUM: 142 mmol/L (ref 135–145)
Total Bilirubin: 0.9 mg/dL (ref 0.3–1.2)
Total Protein: 6.2 g/dL — ABNORMAL LOW (ref 6.5–8.1)

## 2016-08-06 LAB — LEGIONELLA PNEUMOPHILA SEROGP 1 UR AG: L. PNEUMOPHILA SEROGP 1 UR AG: NEGATIVE

## 2016-08-06 LAB — CBC
HCT: 27.1 % — ABNORMAL LOW (ref 36.0–46.0)
Hemoglobin: 8.8 g/dL — ABNORMAL LOW (ref 12.0–15.0)
MCH: 28.7 pg (ref 26.0–34.0)
MCHC: 32.5 g/dL (ref 30.0–36.0)
MCV: 88.3 fL (ref 78.0–100.0)
PLATELETS: 162 10*3/uL (ref 150–400)
RBC: 3.07 MIL/uL — AB (ref 3.87–5.11)
RDW: 14.6 % (ref 11.5–15.5)
WBC: 6.6 10*3/uL (ref 4.0–10.5)

## 2016-08-06 LAB — VANCOMYCIN, TROUGH: Vancomycin Tr: 33 ug/mL (ref 15–20)

## 2016-08-06 MED ORDER — POTASSIUM CHLORIDE CRYS ER 20 MEQ PO TBCR
40.0000 meq | EXTENDED_RELEASE_TABLET | ORAL | Status: AC
Start: 2016-08-06 — End: 2016-08-06
  Administered 2016-08-06: 40 meq via ORAL
  Filled 2016-08-06: qty 2

## 2016-08-06 MED ORDER — IOPAMIDOL (ISOVUE-300) INJECTION 61%
INTRAVENOUS | Status: AC
  Administered 2016-08-06: 20 mL
  Filled 2016-08-06: qty 50

## 2016-08-06 NOTE — Progress Notes (Signed)
PROGRESS NOTE    Latoya [name]  [id number] DOB: 1946/01/12 DOA: 08/04/2016 PCP: Thressa Sheller, MD     Brief Narrative:  Latoya [name] is a 70 y.o. female with paroxysmal atrial fibrillation, moderate CAD, hypertension, asthma, hypothyroidism, hyperlipidemia, antiphospholipid syndrome who was admitted in August 2017 for sepsis secondary to ascending cholangitis and is on biliary drain presents to the ER after patient had 3-4 episodes of nausea vomiting after dinner yesterday and right-sided pleuritic chest pain and patient not feeling well. In the ER x-ray was showing features concerning for pneumonia and lab work showed leukocytosis. Patient states at home, patient had recorded a temperature of 103F. Denies any diarrhea. Patient has a biliary drain from recent cholangitis. Has been followed up by Dr. Brantley Stage, general surgeon. Patient is being admitted for further management of healthcare associated pneumonia (?) and nausea vomiting.   Assessment & Plan:   Principal Problem:   Leukocytosis Active Problems:   Primary hypercoagulable state ([place])   Essential hypertension   CAD, NATIVE VESSEL   PAROXYSMAL ATRIAL FIBRILLATION   Nausea with vomiting   Hypothyroidism   Anemia of chronic disease    Nausea, vomiting - now resolved  -Unclear etiology. Admitted with presumption of HCAP but denies any shortness of breath or cough. Recent ascending cholangitis with biliary drain in place. Follows with Dr. Brantley Stage, general surgeon -Strep pneumo Ag negative, Influenza negative  -Legionella Ag pending  -Blood cultures NGTD  -General surgery following, IR for tube patency  -Continue cefepime, unclear etiology of any infection, but symptoms and leukocytosis improving in either case. Will stop vanco.  -IVF  -Advance diet today   Paroxysmal atrial fibrillation -CHADSVASc 3 -Continue Xarelto, amiodarone, lopressor   Hypothyroidism -Continue Synthroid  Chronic normocytic anemia -Baseline  Hgb 8-9 -Stable   Essential HTN -Well controlled  -Continue lopressor   CAD -Continue Asa, lipitor   Asymptomatic pyuria -Not UTI. Monitor for symptoms    DVT prophylaxis: xarelto Code Status: full Family Communication: spoke w family at bedside  Disposition Plan: home tomorrow as long as stable   Consultants:   General Surgery   Procedures:   None  Antimicrobials:   Vanco 10/14 >> stop 10/16   Cefepime 10/14 >>    Subjective: Patient doing well this morning. She underwent IR to evaluate for patency of her cholecystostomy tube which was unremarkable. She currently is denying any nausea, vomiting, abdominal pain. She states that the symptoms that brought her to the hospital has all resolved. She is hungry and has a great appetite. No shortness of breath or chest pain. No dysuria or diarrhea.  Objective: Vitals:   08/05/16 2154 08/06/16 0500 08/06/16 0838 08/06/16 0942  BP: (!) 137/53 (!) 146/58  (!) 169/66  Pulse: 86 67  74  Resp: 16 18  18   Temp: 98.2 F (36.8 C) 98.4 F (36.9 C)  98.4 F (36.9 C)  TempSrc: Oral Oral  Oral  SpO2: 100% 100% 98% 100%  Weight:      Height:        Intake/Output Summary (Last 24 hours) at 08/06/16 1244 Last data filed at 08/06/16 0950  Gross per 24 hour  Intake             1140 ml  Output             4000 ml  Net            -2860 ml   Filed Weights   08/04/16 0351  Weight: 72.5 kg (  159 lb 13.3 oz)    Examination:  General exam: Appears calm and comfortable  Respiratory system: Respiratory effort normal. Crackles right lung base, no wheeze.  Cardiovascular system: S1 & S2 heard, RRR. No JVD, murmurs, rubs, gallops or clicks. No pedal edema. Gastrointestinal system: Abdomen is nondistended, soft, nonTTP, biliary drain in place clamped  Central nervous system: Alert and oriented. No focal neurological deficits. Extremities: Symmetric 5 x 5 power. Skin: No rashes, lesions or ulcers Psychiatry: Judgement and insight  appear normal. Mood & affect appropriate.   Data Reviewed: I have personally reviewed following labs and imaging studies  CBC:  Recent Labs Lab 08/03/16 2213 08/04/16 0317 08/05/16 1339 08/06/16 0921  WBC 16.4* 19.9* 8.9 6.6  NEUTROABS 14.9* 17.6* 8.0*  --   HGB 11.2* 9.5* 8.8* 8.8*  HCT 34.4* 28.6* 27.2* 27.1*  MCV 89.1 87.2 88.3 88.3  PLT 197 165 128* 0000000   Basic Metabolic Panel:  Recent Labs Lab 08/03/16 2213 08/04/16 0317 08/05/16 0545 08/06/16 0921  NA 135 135 140 142  K 4.1 3.8 3.3* 3.9  CL 99* 102 109 110  CO2 24 23 24 25   GLUCOSE 123* 108* 95 89  BUN 12 11 9  <5*  CREATININE 0.78 0.69 0.66 0.66  CALCIUM 9.8 9.0 8.7* 9.3   GFR: Estimated Creatinine Clearance: 64.8 mL/min (by C-G formula based on SCr of 0.66 mg/dL). Liver Function Tests:  Recent Labs Lab 08/03/16 2213 08/04/16 0317 08/05/16 0545 08/06/16 0921  AST 23 18 14* 15  ALT 13* 14 13* 12*  ALKPHOS 81 65 55 57  BILITOT 0.6 0.8 0.7 0.9  PROT 7.1 5.8* 5.3* 6.2*  ALBUMIN 4.4 3.5 2.9* 3.3*    Recent Labs Lab 08/03/16 2213  LIPASE 24   No results for input(s): AMMONIA in the last 168 hours. Coagulation Profile: No results for input(s): INR, PROTIME in the last 168 hours. Cardiac Enzymes: No results for input(s): CKTOTAL, CKMB, CKMBINDEX, TROPONINI in the last 168 hours. BNP (last 3 results) No results for input(s): PROBNP in the last 8760 hours. HbA1C: No results for input(s): HGBA1C in the last 72 hours. CBG: No results for input(s): GLUCAP in the last 168 hours. Lipid Profile: No results for input(s): CHOL, HDL, LDLCALC, TRIG, CHOLHDL, LDLDIRECT in the last 72 hours. Thyroid Function Tests: No results for input(s): TSH, T4TOTAL, FREET4, T3FREE, THYROIDAB in the last 72 hours. Anemia Panel: No results for input(s): VITAMINB12, FOLATE, FERRITIN, TIBC, IRON, RETICCTPCT in the last 72 hours. Sepsis Labs:  Recent Labs Lab 08/04/16 [id number]  LATICACIDVEN 1.4    Recent Results (from  the past 240 hour(s))  Blood culture (routine x 2)     Status: None (Preliminary result)   Collection Time: 08/04/16  1:00 AM  Result Value Ref Range Status   Specimen Description BLOOD RIGHT WRIST  Final   Special Requests BOTTLES DRAWN AEROBIC AND ANAEROBIC 5ML  Final   Culture NO GROWTH 1 DAY  Final   Report Status PENDING  Incomplete  Blood culture (routine x 2)     Status: None (Preliminary result)   Collection Time: 08/04/16  1:56 AM  Result Value Ref Range Status   Specimen Description BLOOD LEFT HAND  Final   Special Requests BOTTLES DRAWN AEROBIC AND ANAEROBIC 5ML  Final   Culture NO GROWTH 1 DAY  Final   Report Status PENDING  Incomplete       Radiology Studies: Ir Perc Cholecystostomy  Result Date: 08/06/2016 INDICATION: Status post percutaneous cholecystostomy tube  placement on 06/04/2016 to treat acalculous cholecystitis and acute sepsis. EXAM: CHOLECYSTOSTOMY COMPARISON:  Prior cholangiogram through the catheter on 07/17/2016. MEDICATIONS: None ANESTHESIA/SEDATION: None FLUOROSCOPY TIME:  Fluoroscopy Time: 1 minutes.  (10 mGy). CONTRAST:  15 mL [id number] COMPLICATIONS: None. PROCEDURE: Informed written consent was obtained from the patient after a thorough discussion of the procedural risks, benefits and alternatives. All questions were addressed. Maximal Sterile Barrier Technique was utilized including caps, mask, sterile gowns, sterile gloves, sterile drape, hand hygiene and skin antiseptic. A timeout was performed prior to the initiation of the procedure. Contrast was injected through the pre-existing percutaneous cholecystostomy tube. Fluoroscopic spot images were saved. The tube was then flushed with saline and capped. A new StatLock device was applied at the skin exit site and new sterile dressing applied. FINDINGS: With injection of the cholecystostomy tube, there is opacification of a decompressed gallbladder. There is prompt drainage of contrast via a patent cystic duct  into the common bile duct. The common bile duct is normally patent with contrast flow noted into the duodenum. No filling defects, obstruction or contrast extravasation identified. IMPRESSION: Normal patency of the cystic duct and common bile duct with cholangiogram performed through the pre-existing percutaneous cholecystostomy tube. The catheter was capped for a trial of internal drainage. Electronically Signed   By: Aletta Edouard M.D.   On: 08/06/2016 11:39   CXR 10/13 was reviewed independently: Elevated right hemidiaphragm. Infiltrate at right base. No pleural effusion. No pneumothorax.  Scheduled Meds: . amiodarone  200 mg Oral Daily  . aspirin EC  81 mg Oral Daily  . atorvastatin  40 mg Oral Daily  . azelastine  1 spray Each Nare BID  . budesonide (PULMICORT) nebulizer solution  0.5 mg Nebulization BID  . calcium-vitamin D  1 tablet Oral BID  . ceFEPime (MAXIPIME) IV  1 g Intravenous Q8H  . docusate sodium  200 mg Oral BID  . fentaNYL  50 mcg Transdermal Q72H  . gabapentin  300 mg Oral QHS  . levothyroxine  75 mcg Oral QAC breakfast  . metoprolol  50 mg Oral BID  . montelukast  10 mg Oral QHS  . multivitamin with minerals  1 tablet Oral Daily  . pantoprazole  40 mg Oral Daily  . potassium chloride  40 mEq Oral Q4H  . rivaroxaban  20 mg Oral Q breakfast   Continuous Infusions:     LOS: 2 days    Time spent: 25minutes   Dessa Phi, DO [facility] [email or web address] Password [email or web address] 08/06/2016, 12:44 PM

## 2016-08-06 NOTE — Progress Notes (Addendum)
  Subjective: Stable and alert. Tolerating diet somewhat better Bilious drainage from cholecystostomy tube looks clear and noninfected Await IR injection of cholecystostomy tube today.  Objective: Vital signs in last 24 hours: Temp:  [98.2 F (36.8 C)-99.6 F (37.6 C)] 98.4 F (36.9 C) (10/16 0500) Pulse Rate:  [67-86] 67 (10/16 0500) Resp:  [16-18] 18 (10/16 0500) BP: (128-146)/(48-58) 146/58 (10/16 0500) SpO2:  [98 %-100 %] 98 % (10/16 0838) Last BM Date: 08/03/16  Intake/Output from previous day: 10/15 0701 - 10/16 0700 In: 1090 [P.O.:740; IV Piggyback:350] Out: 3725 [Urine:2875; Drains:850] Intake/Output this shift: No intake/output data recorded.   EXAM: General appearance: Alert.  Pleasant.  Cooperative.  Mental status normal.  Nontoxic. GI: Abdomen soft.  Nondistended.  Right upper quadrant drain with clear bile.  Does not look infected.  Benign exam.  Lab Results:   Recent Labs  08/04/16 0317 08/05/16 1339  WBC 19.9* 8.9  HGB 9.5* 8.8*  HCT 28.6* 27.2*  PLT 165 128*   BMET  Recent Labs  08/04/16 0317 08/05/16 0545  NA 135 140  K 3.8 3.3*  CL 102 109  CO2 23 24  GLUCOSE 108* 95  BUN 11 9  CREATININE 0.69 0.66  CALCIUM 9.0 8.7*   PT/INR No results for input(s): LABPROT, INR in the last 72 hours. ABG No results for input(s): PHART, HCO3 in the last 72 hours.  Invalid input(s): PCO2, PO2  Studies/Results: No results found.  Anti-infectives: Anti-infectives    Start     Dose/Rate Route Frequency Ordered Stop   08/04/16 1000  vancomycin (VANCOCIN) IVPB 1000 mg/200 mL premix     1,000 mg 200 mL/hr over 60 Minutes Intravenous Every 12 hours 08/04/16 0623     08/04/16 0800  ceFEPIme (MAXIPIME) 1 g in dextrose 5 % 50 mL IVPB     1 g 100 mL/hr over 30 Minutes Intravenous Every 8 hours 08/04/16 0623     08/04/16 0100  vancomycin (VANCOCIN) IVPB 1000 mg/200 mL premix     1,000 mg 200 mL/hr over 60 Minutes Intravenous  Once 08/04/16 0051  08/04/16 0254   08/04/16 0100  piperacillin-tazobactam (ZOSYN) IVPB 3.375 g     3.375 g 100 mL/hr over 30 Minutes Intravenous  Once 08/04/16 0051 08/04/16 0255      Assessment/Plan:  Cholecystostomy tube placed August 2017. Clinically I do not think this is the source of her sepsis Needs IR to inject drain today to see if cystic duct is still patent.  I discussed this with her R.N. Gerald Stabs) who will see this is done If cystic duct still patent, we will consider clamping of cholecystostomy tube to see if she tolerates. This decision is not urgent, so we will await resolution of her other medical problems.  Possible right lower lobe pneumonia. Primary hypercoagulable state-on xarelto Nausea and vomiting.  Seems to have resolved. Hypertension  coronary artery disease Paroxysmal atrial fibrillation Anemia History Escherichia coli and enterococcal bacteremia, August 2017     LOS: 2 days    Fanny Skates M 08/06/2016

## 2016-08-06 NOTE — Procedures (Signed)
Interventional Radiology Procedure Note  Procedure:  Cholangiogram through perc choly tube   Complications: None  Estimated Blood Loss: None  Findings:  Cystic and common bile ducts widely patent.  Perc choly flushed and capped for trial of internal drainage.  Venetia Night. Kathlene Cote, M.D Pager:  [phone]

## 2016-08-07 DIAGNOSIS — D6859 Other primary thrombophilia: Secondary | ICD-10-CM

## 2016-08-07 LAB — CBC
HCT: 30.5 % — ABNORMAL LOW (ref 36.0–46.0)
Hemoglobin: 10.1 g/dL — ABNORMAL LOW (ref 12.0–15.0)
MCH: 29 pg (ref 26.0–34.0)
MCHC: 33.1 g/dL (ref 30.0–36.0)
MCV: 87.6 fL (ref 78.0–100.0)
PLATELETS: 225 10*3/uL (ref 150–400)
RBC: 3.48 MIL/uL — ABNORMAL LOW (ref 3.87–5.11)
RDW: 14.2 % (ref 11.5–15.5)
WBC: 7.4 10*3/uL (ref 4.0–10.5)

## 2016-08-07 MED ORDER — NYSTATIN 100000 UNIT/ML MT SUSP: 5.0000 mL | Freq: Four times a day (QID) | OROMUCOSAL | 0 refills | Status: DC | PRN

## 2016-08-07 MED ORDER — NYSTATIN 100000 UNIT/GM EX POWD: CUTANEOUS | 0 refills | Status: DC

## 2016-08-07 MED ORDER — MAGIC MOUTHWASH: 1.0000 mL | Freq: Four times a day (QID) | ORAL | 0 refills | Status: DC | PRN

## 2016-08-07 MED ORDER — LEVOFLOXACIN 750 MG PO TABS: 750.0000 mg | ORAL_TABLET | Freq: Every day | ORAL | 0 refills | Status: AC

## 2016-08-07 MED ORDER — LEVOFLOXACIN 750 MG PO TABS
750.0000 mg | ORAL_TABLET | Freq: Every day | ORAL | Status: DC
  Administered 2016-08-07: 750 mg via ORAL
  Filled 2016-08-07: qty 1

## 2016-08-07 NOTE — Discharge Summary (Signed)
Physician Discharge Summary  Latoya [name] [facility] [id number] DOB: 1946-06-15 DOA: 08/04/2016  PCP: Latoya Sheller, MD  Admit date: 08/04/2016 Discharge date: 08/07/2016  Admitted From: Home Disposition:  Home  Recommendations for Outpatient Follow-up:  1. Follow up with PCP as previously scheduled 2. Follow up with Dr. Brantley Stage (general Surgery) on Friday.  3. Follow up on final blood culture results  Home Health: No  Equipment/Devices: None   Discharge Condition: Stable CODE STATUS: Full  Diet recommendation: general   Brief/Interim Summary: Latoya [name] a 70 y.o.femalewith paroxysmal atrial fibrillation, moderate CAD, hypertension, asthma, hypothyroidism, hyperlipidemia, antiphospholipid syndrome who was admitted in August 2017 for sepsis secondary to ascending cholangitis and is on biliary drain presents to the ER after patient had 3-4 episodes of nausea vomiting after dinner and right-sided pleuritic chest pain and patient not feeling well. In the ER x-ray was showing features concerning for pneumonia and lab work showed leukocytosis. Patient states at home, patient had recorded a temperature of 103F. Denies any diarrhea. Patient has a biliary drain fromrecent cholangitis. Has been followed up by Dr. Brantley Stage, general surgeon. Patient was initially admitted for HCAP, but due to lack of her symptoms (No cough, or shortness of breath) consistent with pneumonia, this was not considered source of her sepsis. She was initially started on vancomycin as well as cefepime. She was evaluated by general surgery and underwent IR cholangiogram through per chole tube to assess patency of her cystic and common bile ducts. They were widely patent. Her biliary tube was capped. Her symptoms of nausea, vomiting resolved. On the day of discharge, she was tolerating diet. It is unclear what caused her leukocytosis, nausea, vomiting. She will be discharged with empiric Levaquin and she should follow-up  with her general surgeon as well as PCP.   Discharge Diagnoses:  Principal Problem:   Nausea with vomiting Active Problems:   Primary hypercoagulable state ([place])   Essential hypertension   CAD, NATIVE VESSEL   PAROXYSMAL ATRIAL FIBRILLATION   Leukocytosis   Hypothyroidism   Anemia of chronic disease  Leukocytosis, nausea, vomiting  -Unclear etiology. Admitted with presumption of HCAP but denies any shortness of breath or cough. Recent ascending cholangitis with biliary drain in place. Follows with Dr. Brantley Stage, general surgeon  -Strep pneumo Ag negative  -Legionella Ag negative  -Blood cultures NGTD  -Influenza negative  -Appreciate general surgery -Status post IR cholangiogram: widely patent cystic and common bile duct -All symptoms resolved  -Stop vanco, cefepime. Will discharge with empiric levaquin for total 7 day tx (last day 10/20)   Paroxysmal atrial fibrillation -CHADSVASc 3 -Continue Xarelto, amiodarone, lopressor   Hypothyroidism -Continue Synthroid  Chronic normocytic anemia -Baseline Hgb 8-9 -Stable   Essential HTN -Well controlled  -Continue lopressor   CAD -Continue Asa, lipitor   Asymptomatic pyuria -Not UTI. Monitor for symptoms    Discharge Instructions  Discharge Instructions    Call MD for:  persistant nausea and vomiting    Complete by:  As directed    Call MD for:  severe uncontrolled pain    Complete by:  As directed    Call MD for:  temperature >100.4    Complete by:  As directed    Diet - low sodium heart healthy    Complete by:  As directed    Increase activity slowly    Complete by:  As directed        Medication List    TAKE these medications   albuterol 108 (90 Base) MCG/ACT  inhaler Commonly known as:  PROVENTIL HFA;VENTOLIN HFA Inhale 2 puffs into the lungs every 6 (six) hours as needed for wheezing or shortness of breath.   amiodarone 200 MG tablet Commonly known as:  PACERONE Take 1 tablet (200 mg total) by  mouth daily.   aspirin 81 MG EC tablet Take 81 mg by mouth daily.   ASTELIN 0.1 % nasal spray Generic drug:  azelastine Place 1 spray into the nose 2 (two) times daily.   atorvastatin 40 MG tablet Commonly known as:  LIPITOR Take 40 mg by mouth daily. What changed:  Another medication with the same name was removed. Continue taking this medication, and follow the directions you see here.   beclomethasone 80 MCG/ACT inhaler Commonly known as:  QVAR Inhale 2 puffs into the lungs 2 (two) times daily. What changed:  when to take this  additional instructions   CALCIUM 500 +D 500-400 MG-UNIT Tabs Generic drug:  Calcium Carb-Cholecalciferol Take 1 tablet by mouth 2 (two) times daily.   CLARITIN-D 12 HOUR PO Take 1 tablet by mouth 2 (two) times daily.   diazepam 5 MG tablet Commonly known as:  VALIUM Take 5 mg by mouth at bedtime as needed for sleep.   docusate sodium 100 MG capsule Commonly known as:  COLACE Take 200 mg by mouth 2 (two) times daily.   EPINEPHrine 0.3 mg/0.3 mL Soaj injection Commonly known as:  EPIPEN Inject 0.3 mLs (0.3 mg total) into the muscle once. What changed:  when to take this  reasons to take this   esomeprazole 40 MG capsule Commonly known as:  NEXIUM Take 40 mg by mouth 2 (two) times daily.   fentaNYL 12 MCG/HR Commonly known as:  DURAGESIC - dosed mcg/hr Place 12 mcg onto the skin every 3 (three) days. IN CONJUNCTION WITH ONE 50 MCG PATCH TO EQUAL A TOTAL OF 62 MICROGRAMS What changed:  Another medication with the same name was removed. Continue taking this medication, and follow the directions you see here.   fentaNYL 50 MCG/HR Commonly known as:  DURAGESIC - dosed mcg/hr Place 50 mcg onto the skin every 3 (three) days. IN CONJUNCTION WITH ONE 12 MCG PATCH TO EQUAL A TOTAL OF 62 MICROGRAMS What changed:  Another medication with the same name was removed. Continue taking this medication, and follow the directions you see here.    FLUTTER Devi Use as directed   gabapentin 600 MG tablet Commonly known as:  NEURONTIN Take 300 mg by mouth at bedtime.   Glucosamine-Chondroitin 750-600 MG Tabs Take 2 tablets by mouth 2 (two) times daily.   HYDROmorphone 4 MG tablet Commonly known as:  DILAUDID Take 1 tablet (4 mg total) by mouth every 4 (four) hours as needed. For pain What changed:  how much to take  reasons to take this  additional instructions   levofloxacin 750 MG tablet Commonly known as:  LEVAQUIN Take 1 tablet (750 mg total) by mouth daily.   levothyroxine 75 MCG tablet Commonly known as:  SYNTHROID, LEVOTHROID Take 75 mcg by mouth daily before breakfast.   lidocaine 5 % Commonly known as:  LIDODERM Place 1 patch onto the skin daily. Remove & Discard patch within 12 hours or as directed by MD   magic mouthwash Soln Take 1 mL by mouth 4 (four) times daily as needed for mouth pain.   metaxalone 800 MG tablet Commonly known as:  SKELAXIN Take 800 mg by mouth every 8 (eight) hours as needed. For muscle pain  metoprolol 50 MG tablet Commonly known as:  LOPRESSOR Take 1 tablet (50 mg total) by mouth 2 (two) times daily.   montelukast 10 MG tablet Commonly known as:  SINGULAIR Take 10 mg by mouth at bedtime.   multivitamin with minerals Tabs tablet Take 1 tablet by mouth daily.   NITROSTAT 0.4 MG SL tablet Generic drug:  nitroGLYCERIN DISSOLVE 1 TABLET UNDER TONGUE AS NEEDED FOR CHEST PAIN,MAY REPEAT IN5 MINUTES FOR 2 DOSES.   nystatin 100000 UNIT/ML suspension Commonly known as:  MYCOSTATIN Take 5 mLs (500,000 Units total) by mouth 4 (four) times daily as needed (oral thrush).   nystatin powder Commonly known as:  MYCOSTATIN/NYSTOP Apply topically to skin folds as needed, up to 4 times daily   ondansetron 4 MG tablet Commonly known as:  ZOFRAN Take 4 mg by mouth every 8 (eight) hours as needed for nausea/vomiting.   triamcinolone 55 MCG/ACT nasal inhaler Commonly known as:   NASACORT Place 1-2 sprays into the nose 2 (two) times daily.   XARELTO 20 MG Tabs tablet Generic drug:  rivaroxaban Take 20 mg by mouth daily after breakfast. What changed:  Another medication with the same name was removed. Continue taking this medication, and follow the directions you see here.      Follow-up Information    Latoya Sheller, MD .   Specialty:  Internal Medicine Contact information: [place], [place] [address] [phone]        Turner Daniels., MD .   Specialty:  General Surgery Contact information: [address] [place] [address] [phone]          Allergies  Allergen Reactions  . Enoxaparin Sodium Anaphylaxis    REACTION: thrombocytopenia  . Heparin Anaphylaxis    REACTION: thrombocytopenia  . Hornet Venom Anaphylaxis  . Other Anaphylaxis    Sensitive to dye in Betadine & Chlorohexadine   . Pork-Derived Products Anaphylaxis  . Chlorhexidine Itching    Reports that it is the dye in it  . Lactose Intolerance (Gi) Diarrhea and Nausea And Vomiting    Can tolerate most [place] cheeses (Takes Lactaid)  . Indomethacin Rash  . Phenylpropanolamine Hypertension  . Povidone Iodine Rash  . Povidone-Iodine Itching and Rash    Sensitivity- but if its wiped off she is able to tolerate betadine     Consultations:  General Surgery    Procedures/Studies: Dg Chest 2 View  Result Date: 08/03/2016 CLINICAL DATA:  Right ribcage pain with shortness of breath and emesis EXAM: CHEST  2 VIEW COMPARISON:  06/16/2016 FINDINGS: Right diaphragm is mildly elevated. Streaky and hazy atelectasis or infiltrate at the right base. No large effusion. Heart size stable. No pneumothorax. Partially visualized lower cervical hardware. Posterior stabilization rod and screw fixation of the spine. There is a drain in the right upper quadrant. IMPRESSION: Mildly elevated right diaphragm. Hazy atelectasis or infiltrate at the right  lung base. Electronically Signed   By: Donavan Foil M.D.   On: 08/03/2016 22:01   Ir Perc Cholecystostomy  Result Date: 08/06/2016 INDICATION: Status post percutaneous cholecystostomy tube placement on 06/04/2016 to treat acalculous cholecystitis and acute sepsis. EXAM: CHOLECYSTOSTOMY COMPARISON:  Prior cholangiogram through the catheter on 07/17/2016. MEDICATIONS: None ANESTHESIA/SEDATION: None FLUOROSCOPY TIME:  Fluoroscopy Time: 1 minutes.  (10 mGy). CONTRAST:  15 mL [id number] COMPLICATIONS: None. PROCEDURE: Informed written consent was obtained from the patient after a thorough discussion of the procedural risks, benefits and alternatives. All questions were addressed. Maximal Sterile Barrier  Technique was utilized including caps, mask, sterile gowns, sterile gloves, sterile drape, hand hygiene and skin antiseptic. A timeout was performed prior to the initiation of the procedure. Contrast was injected through the pre-existing percutaneous cholecystostomy tube. Fluoroscopic spot images were saved. The tube was then flushed with saline and capped. A new StatLock device was applied at the skin exit site and new sterile dressing applied. FINDINGS: With injection of the cholecystostomy tube, there is opacification of a decompressed gallbladder. There is prompt drainage of contrast via a patent cystic duct into the common bile duct. The common bile duct is normally patent with contrast flow noted into the duodenum. No filling defects, obstruction or contrast extravasation identified. IMPRESSION: Normal patency of the cystic duct and common bile duct with cholangiogram performed through the pre-existing percutaneous cholecystostomy tube. The catheter was capped for a trial of internal drainage. Electronically Signed   By: Aletta Edouard M.D.   On: 08/06/2016 11:39   Dg Sinus/fist Tube Chk-non Gi  Result Date: 07/17/2016 INDICATION: 70 year old female with a history of percutaneous cholecystostomy EXAM:  CHOLECYSTOSTOMY TUBE INJECTION MEDICATIONS: NONE ANESTHESIA/SEDATION: NONE COMPLICATIONS: NONE PROCEDURE: Informed written consent was obtained from the patient after a thorough discussion of the procedural risks, benefits and alternatives. All questions were addressed. Personal protective gear was used. A timeout was performed prior to the initiation of the procedure. Patient is positioned supine position. A through the tube cholangiogram of percutaneous cholecystostomy was performed. The tube was then attached to gravity drainage. FINDINGS: Through the tube cholangiogram demonstrates contracted gallbladder, with patent cystic duct and common bile duct. Contrast entered the duodenum. Mild reflux into the pancreatic duct. IMPRESSION: Status post through the tube cholangiogram of percutaneous cholecystostomy demonstrating a patent cystic duct and common bile duct. The study is nondiagnostic for identification of residual cholelithiasis. If demonstrating volume of potential cholelithiasis will change treatment plan, a repeat CT would be indicated. Signed, Dulcy Fanny. Earleen Newport, DO [facility] Radiology Electronically Signed   By: Corrie Mckusick D.O.   On: 07/17/2016 15:22   US Abdomen Limited Ruq  Result Date: 07/25/2016 CLINICAL DATA:  Cholecystitis.  History of cholecystostomy. EXAM: US ABDOMEN LIMITED - RIGHT UPPER QUADRANT COMPARISON:  Cholecystostomy images 07/17/2016. Ultrasound 06/04/2016. CT 08/14/ 2017. FINDINGS: Gallbladder: The gallbladder is contracted. Sludge is noted in the gallbladder. Gallbladder wall thickness 2.3 mm. Negative Murphy sign. Small amount of pericholecystic fluid. Common bile duct: Diameter: 6.3 mm Liver: 6.6 x 5.0 x 5.2 cm heterogeneous mass central portion of the liver. As noted on prior CT of 06/04/2016, given its CT characteristics this is most likely a hemangioma. No change. IMPRESSION: 1. The gallbladder is contracted. Sludge is noted  in the gallbladder. Small amount of pericholecystic fluid. 2. Stable appearance of 6.6 cm mass right hepatic lobe as noted on prior CT of 06/04/2016. This is most likely a hemangioma given CT characteristics. Electronically Signed   By: [name]  Register   On: 07/25/2016 11:41     Subjective: Feeling well today. Tolerating diet. Denies any fevers, chest pain, shortness of breath, nausea or vomiting. Having normal bowel movements and denies any abdominal pain.  Discharge Exam: Vitals:   08/06/16 2033 08/07/16 0450  BP: (!) 157/64 (!) 141/58  Pulse: 82 68  Resp: 19 18  Temp: 98 F (36.7 C) 98.6 F (37 C)   Vitals:   08/06/16 1939 08/06/16 2033 08/07/16 0450 08/07/16 0742  BP:  (!) 157/64 (!) 141/58   Pulse:  82 68  Resp:  19 18   Temp:  98 F (36.7 C) 98.6 F (37 C)   TempSrc:  Oral Oral   SpO2: 98% 100% 97% 96%  Weight:  75.8 kg (167 lb 3.2 oz)    Height:         General: Pt is alert, awake, not in acute distress Cardiovascular: RRR, S1/S2 +, no rubs, no gallops Respiratory: CTA bilaterally, no wheezing, no rhonchi Abdominal: Soft, NT, ND, bowel sounds + Extremities: no edema, no cyanosis    The results of significant diagnostics from this hospitalization (including imaging, microbiology, ancillary and laboratory) are listed below for reference.     Microbiology: Recent Results (from the past 240 hour(s))  Blood culture (routine x 2)     Status: None (Preliminary result)   Collection Time: 08/04/16  1:00 AM  Result Value Ref Range Status   Specimen Description BLOOD RIGHT WRIST  Final   Special Requests BOTTLES DRAWN AEROBIC AND ANAEROBIC 5ML  Final   Culture NO GROWTH 2 DAYS  Final   Report Status PENDING  Incomplete  Blood culture (routine x 2)     Status: None (Preliminary result)   Collection Time: 08/04/16  1:56 AM  Result Value Ref Range Status   Specimen Description BLOOD LEFT HAND  Final   Special Requests BOTTLES DRAWN AEROBIC AND ANAEROBIC 5ML  Final    Culture NO GROWTH 2 DAYS  Final   Report Status PENDING  Incomplete     Labs: BNP (last 3 results) No results for input(s): BNP in the last 8760 hours. Basic Metabolic Panel:  Recent Labs Lab 08/03/16 2213 08/04/16 0317 08/05/16 0545 08/06/16 0921  NA 135 135 140 142  K 4.1 3.8 3.3* 3.9  CL 99* 102 109 110  CO2 24 23 24 25   GLUCOSE 123* 108* 95 89  BUN 12 11 9  <5*  CREATININE 0.78 0.69 0.66 0.66  CALCIUM 9.8 9.0 8.7* 9.3   Liver Function Tests:  Recent Labs Lab 08/03/16 2213 08/04/16 0317 08/05/16 0545 08/06/16 0921  AST 23 18 14* 15  ALT 13* 14 13* 12*  ALKPHOS 81 65 55 57  BILITOT 0.6 0.8 0.7 0.9  PROT 7.1 5.8* 5.3* 6.2*  ALBUMIN 4.4 3.5 2.9* 3.3*    Recent Labs Lab 08/03/16 2213  LIPASE 24   No results for input(s): AMMONIA in the last 168 hours. CBC:  Recent Labs Lab 08/03/16 2213 08/04/16 0317 08/05/16 1339 08/06/16 0921  WBC 16.4* 19.9* 8.9 6.6  NEUTROABS 14.9* 17.6* 8.0*  --   HGB 11.2* 9.5* 8.8* 8.8*  HCT 34.4* 28.6* 27.2* 27.1*  MCV 89.1 87.2 88.3 88.3  PLT 197 165 128* 162   Cardiac Enzymes: No results for input(s): CKTOTAL, CKMB, CKMBINDEX, TROPONINI in the last 168 hours. BNP: Invalid input(s): POCBNP CBG: No results for input(s): GLUCAP in the last 168 hours. D-Dimer No results for input(s): DDIMER in the last 72 hours. Hgb A1c No results for input(s): HGBA1C in the last 72 hours. Lipid Profile No results for input(s): CHOL, HDL, LDLCALC, TRIG, CHOLHDL, LDLDIRECT in the last 72 hours. Thyroid function studies No results for input(s): TSH, T4TOTAL, T3FREE, THYROIDAB in the last 72 hours.  Invalid input(s): FREET3 Anemia work up No results for input(s): VITAMINB12, FOLATE, FERRITIN, TIBC, IRON, RETICCTPCT in the last 72 hours. Urinalysis    Component Value Date/Time   COLORURINE AMBER (A) 08/04/2016 0130   APPEARANCEUR CLOUDY (A) 08/04/2016 0130   LABSPEC 1.019 08/04/2016 0130   PHURINE  5.0 08/04/2016 0130   GLUCOSEU  NEGATIVE 08/04/2016 0130   HGBUR SMALL (A) 08/04/2016 0130   BILIRUBINUR NEGATIVE 08/04/2016 0130   BILIRUBINUR neg 02/08/2014 1955   KETONESUR NEGATIVE 08/04/2016 0130   PROTEINUR NEGATIVE 08/04/2016 0130   UROBILINOGEN 0.2 02/08/2014 1955   UROBILINOGEN 2.0 (H) 02/06/2009 1620   NITRITE NEGATIVE 08/04/2016 0130   LEUKOCYTESUR SMALL (A) 08/04/2016 0130   Sepsis Labs Invalid input(s): PROCALCITONIN,  WBC,  LACTICIDVEN Microbiology Recent Results (from the past 240 hour(s))  Blood culture (routine x 2)     Status: None (Preliminary result)   Collection Time: 08/04/16  1:00 AM  Result Value Ref Range Status   Specimen Description BLOOD RIGHT WRIST  Final   Special Requests BOTTLES DRAWN AEROBIC AND ANAEROBIC 5ML  Final   Culture NO GROWTH 2 DAYS  Final   Report Status PENDING  Incomplete  Blood culture (routine x 2)     Status: None (Preliminary result)   Collection Time: 08/04/16  1:56 AM  Result Value Ref Range Status   Specimen Description BLOOD LEFT HAND  Final   Special Requests BOTTLES DRAWN AEROBIC AND ANAEROBIC 5ML  Final   Culture NO GROWTH 2 DAYS  Final   Report Status PENDING  Incomplete     Time coordinating discharge: Over 30 minutes  SIGNED:  Dessa Phi, DO [facility] Pager [phone]  If 7PM-7AM, please contact night-coverage [email or web address] Password [email or web address] 08/07/2016, 12:27 PM

## 2016-08-07 NOTE — Progress Notes (Signed)
Patient ID: Latoya [name], female   DOB: [birth year], 70 y.o.   MRN: [id number]  [facility] Surgery Progress Note     Subjective: Feeling well this morning. Denies any abdominal pain, nausea, or vomiting. Likely going home today. Cholecystostomy tube injected yesterday and found to be patent; tube clamped.  Objective: Vital signs in last 24 hours: Temp:  [98 F (36.7 C)-98.7 F (37.1 C)] 98.6 F (37 C) (10/17 0450) Pulse Rate:  [68-83] 68 (10/17 0450) Resp:  [18-19] 18 (10/17 0450) BP: (141-167)/(58-86) 141/58 (10/17 0450) SpO2:  [96 %-100 %] 96 % (10/17 0742) Weight:  [167 lb 3.2 oz (75.8 kg)] 167 lb 3.2 oz (75.8 kg) (10/16 2033) Last BM Date: 08/05/16  Intake/Output from previous day: 10/16 0701 - 10/17 0700 In: 620 [P.O.:470; IV Piggyback:150] Out: 1925 [Urine:1825; Drains:100] Intake/Output this shift: Total I/O In: 270 [P.O.:270] Out: 0   PE: Gen:  Alert, NAD, pleasant Abd: Soft, NT/ND, +BS, cholecystostomy tube clamped  Lab Results:   Recent Labs  08/05/16 1339 08/06/16 0921  WBC 8.9 6.6  HGB 8.8* 8.8*  HCT 27.2* 27.1*  PLT 128* 162   BMET  Recent Labs  08/05/16 0545 08/06/16 0921  NA 140 142  K 3.3* 3.9  CL 109 110  CO2 24 25  GLUCOSE 95 89  BUN 9 <5*  CREATININE 0.66 0.66  CALCIUM 8.7* 9.3   PT/INR No results for input(s): LABPROT, INR in the last 72 hours. CMP     Component Value Date/Time   NA 142 08/06/2016 0921   K 3.9 08/06/2016 0921   CL 110 08/06/2016 0921   CO2 25 08/06/2016 0921   GLUCOSE 89 08/06/2016 0921   BUN <5 (L) 08/06/2016 0921   CREATININE 0.66 08/06/2016 0921   CREATININE 0.50 02/12/2013 1831   CALCIUM 9.3 08/06/2016 0921   PROT 6.2 (L) 08/06/2016 0921   ALBUMIN 3.3 (L) 08/06/2016 0921   AST 15 08/06/2016 0921   ALT 12 (L) 08/06/2016 0921   ALKPHOS 57 08/06/2016 0921   BILITOT 0.9 08/06/2016 0921   GFRNONAA >60 08/06/2016 0921   GFRAA >60 08/06/2016 0921   Lipase     Component Value Date/Time   LIPASE 24 08/03/2016 2213       Studies/Results: Ir Perc Cholecystostomy  Result Date: 08/06/2016 INDICATION: Status post percutaneous cholecystostomy tube placement on 06/04/2016 to treat acalculous cholecystitis and acute sepsis. EXAM: CHOLECYSTOSTOMY COMPARISON:  Prior cholangiogram through the catheter on 07/17/2016. MEDICATIONS: None ANESTHESIA/SEDATION: None FLUOROSCOPY TIME:  Fluoroscopy Time: 1 minutes.  (10 mGy). CONTRAST:  15 mL [id number] COMPLICATIONS: None. PROCEDURE: Informed written consent was obtained from the patient after a thorough discussion of the procedural risks, benefits and alternatives. All questions were addressed. Maximal Sterile Barrier Technique was utilized including caps, mask, sterile gowns, sterile gloves, sterile drape, hand hygiene and skin antiseptic. A timeout was performed prior to the initiation of the procedure. Contrast was injected through the pre-existing percutaneous cholecystostomy tube. Fluoroscopic spot images were saved. The tube was then flushed with saline and capped. A new StatLock device was applied at the skin exit site and new sterile dressing applied. FINDINGS: With injection of the cholecystostomy tube, there is opacification of a decompressed gallbladder. There is prompt drainage of contrast via a patent cystic duct into the common bile duct. The common bile duct is normally patent with contrast flow noted into the duodenum. No filling defects, obstruction or contrast extravasation identified. IMPRESSION: Normal patency of the cystic duct and common bile  duct with cholangiogram performed through the pre-existing percutaneous cholecystostomy tube. The catheter was capped for a trial of internal drainage. Electronically Signed   By: Aletta Edouard M.D.   On: 08/06/2016 11:39    Anti-infectives: Anti-infectives    Start     Dose/Rate Route Frequency Ordered Stop   08/07/16 1000  levofloxacin (LEVAQUIN) tablet 750 mg     750 mg Oral Daily  08/07/16 0737     08/04/16 1000  vancomycin (VANCOCIN) IVPB 1000 mg/200 mL premix  Status:  Discontinued     1,000 mg 200 mL/hr over 60 Minutes Intravenous Every 12 hours 08/04/16 0623 08/06/16 1020   08/04/16 0800  ceFEPIme (MAXIPIME) 1 g in dextrose 5 % 50 mL IVPB  Status:  Discontinued     1 g 100 mL/hr over 30 Minutes Intravenous Every 8 hours 08/04/16 0623 08/07/16 0737   08/04/16 0100  vancomycin (VANCOCIN) IVPB 1000 mg/200 mL premix     1,000 mg 200 mL/hr over 60 Minutes Intravenous  Once 08/04/16 0051 08/04/16 0254   08/04/16 0100  piperacillin-tazobactam (ZOSYN) IVPB 3.375 g     3.375 g 100 mL/hr over 30 Minutes Intravenous  Once 08/04/16 0051 08/04/16 0255       Assessment/Plan Cholecystostomy tube placed August 2017. - likely not the source of her sepsis - drain injected yesterday in IR and found cystic and common bile ducts patent - cholecystostomy tube clamped yesterday  Possible right lower lobe pneumonia Primary hypercoagulable state-on xarelto Nausea and vomiting. Hypertension  Coronary artery disease Paroxysmal atrial fibrillation Anemia History Escherichia coli and enterococcal bacteremia, August 2017   Plan - keep cholecystostomy tube clamped. Follow-up as scheduled with Dr. Brantley Stage this Friday.   LOS: 3 days    Jerrye Beavers , [facility] Surgery 08/07/2016, 10:03 AM Pager: [phone] Consults: [phone] Mon-Fri 7:00 am-4:30 pm Sat-Sun 7:00 am-11:30 am  Agree with above.  Alphonsa Overall, MD, [facility] Surgery Pager: [phone] Office phone:  [phone]

## 2016-08-07 NOTE — Progress Notes (Signed)
Pt being discharged home via wheelchair with family. Pt alert and oriented x4. VSS. Pt c/o no pain at this time. No signs of respiratory distress. Education complete and care plans resolved. IV removed with catheter intact and pt tolerated well. No further issues at this time. Pt to follow up with PCP. Tamberlyn Midgley R, RN 

## 2016-08-07 NOTE — Care Management Note (Signed)
Case Management Note  Patient Details  Name: Latoya [name] MRN: [id number] Date of Birth: 10/07/1946  Subjective/Objective:      CM following for progression and d/c planning.              Action/Plan: 08/07/2016 Pt active with [facility] ( [facility] for [facility] and [place].   Expected Discharge Date:   08/07/2016               Expected Discharge Plan:  [place]  In-House Referral:  NA  Discharge planning Services  CM Consult  Post Acute Care Choice:  Home Health Choice offered to:  Patient  DME Arranged:  N/A DME Agency:  NA  HH Arranged:  RN, PT [place] Agency:  [facility]now [facility])  Status of Service:  Completed, signed off  If discussed at [place] of Stay Meetings, dates discussed:    Additional Comments:  Adron Bene, RN 08/07/2016, 3:42 PM

## 2016-08-08 ENCOUNTER — Encounter (HOSPITAL_COMMUNITY): Payer: Self-pay

## 2016-08-09 LAB — CULTURE, BLOOD (ROUTINE X 2)
CULTURE: NO GROWTH
Culture: NO GROWTH

## 2016-08-09 NOTE — Care Management (Signed)
Late entry: 08/09/2016  Received call from [place] of [facility] home [facility]  And this pt has declined [facility] services.  Jasmine Pang RN MPH, case manager, [phone]

## 2016-08-13 ENCOUNTER — Encounter: Payer: Self-pay | Admitting: Family Medicine

## 2016-08-13 ENCOUNTER — Ambulatory Visit (INDEPENDENT_AMBULATORY_CARE_PROVIDER_SITE_OTHER): Payer: Medicare Other | Admitting: Family Medicine

## 2016-08-13 VITALS — BP 118/72 | HR 76 | Temp 98.5°F | Ht 61.0 in

## 2016-08-13 DIAGNOSIS — I6523 Occlusion and stenosis of bilateral carotid arteries: Secondary | ICD-10-CM

## 2016-08-13 DIAGNOSIS — K830 Cholangitis: Secondary | ICD-10-CM

## 2016-08-13 DIAGNOSIS — K8309 Other cholangitis: Secondary | ICD-10-CM

## 2016-08-13 DIAGNOSIS — R5381 Other malaise: Secondary | ICD-10-CM | POA: Diagnosis not present

## 2016-08-13 NOTE — Progress Notes (Signed)
[place] at [facility] [address], [place], [place] [address] [phone]  Date:  08/13/2016   Name:  Latoya [name]   DOB:  17-Mar-1946   MRN:  [id number]  PCP:  Thressa Sheller, MD    Chief Complaint: Follow-up (Pt here for rehab follow up. )   History of Present Illness:  Latoya [name] is a 70 y.o. very pleasant female patient who presents with the following:  I have seen this pt at [facility], last about 2 years ago. She is here today to follow-up from recent hospital stays; she was in the hospital in August from 8/14 to 8/31 for ascending cholangitis.  She was very ill and developed severe sepsis, has a per-cutaneous cholecystomy drain in place. She was then in rehab for most of September to regain strength.  She went back to the hospital for 3 days about 10 days ago with illness- at first thought to be pneumonia but then the reason for her sx was less clear.  They are still not quite sure why she got sick with apparent infection again, but in any case she again improving and gaining strength She finished her levaquin last week. She is feeling "fine," she is no longer vomiting.  No fever or chills since she got home.  She is eating "very carefully," a low fat diet to take it easy on her gallbladder.   She is now using her scooter to get around- she has had this for a long time (10 years) due to her back problems.  She does not always use this and I have not seen her in this in the past.    Dr. Vertell Limber is her NSG  She sees Dr. Brantley Stage this week.   They think that they will be able to remove her drain and then have her gallbladder out in the near future.    She is s/p hysterectomy.  She has anti- phospholipid ab syndrome.   This is why she was not able to have kids- she had recurrent MC.  She has participated in a study of this condition which she hopes will help other women with the same problem.   She has intermittent a fib and is able to  feel when she is in fib. She is not currently in a fib She is on amiodarone, lopressor, xarelto  No urinary sx, no cough  Patient Active Problem List   Diagnosis Date Noted  . Healthcare-associated pneumonia 08/04/2016  . Leukocytosis 08/04/2016  . Nausea with vomiting 08/04/2016  . Hypothyroidism 08/04/2016  . Anemia of chronic disease 08/04/2016  . Cholangitis   . Gastroparesis   . Acute respiratory failure ([place])   . Enterococcal bacteremia 06/06/2016  . Bacteremia due to Klebsiella pneumoniae 06/06/2016  . Bacteremia due to Escherichia coli 06/06/2016  . Acute respiratory failure with hypoxia ([place])   . Ascending cholangitis 06/04/2016  . Hemangioma of liver 06/04/2016  . Cervical pseudoarthrosis ([place]) 08/31/2014  . Anaphylaxis, mild, due to wasp envenomation 04/12/2013  . Chest pain, atypical 04/11/2013  . PAD (peripheral artery disease) ([place]) 06/20/2011  . ANEMIA, IRON DEFICIENCY, MICROCYTIC 06/01/2010  . Primary hypercoagulable state ([place]) 06/01/2010  . BACK PAIN, CHRONIC 06/01/2010  . ABDOMINAL PAIN -GENERALIZED 06/01/2010  . CAROTID ARTERY DISEASE 05/25/2010  . CAD, NATIVE VESSEL 11/15/2009  . MURMUR 11/15/2009  . CAROTID BRUIT, LEFT 11/15/2009  . Essential hypertension 03/18/2009  . History of pulmonary embolism 03/18/2009  .  PAROXYSMAL ATRIAL FIBRILLATION 03/18/2009  . DVT 03/18/2009  . Asthma 03/18/2009  . GERD 03/18/2009  . SPONDYLOSIS, LUMBAR 03/18/2009  . HYPERLIPIDEMIA 11/29/2008  . CORONARY HEART DISEASE 11/29/2008    Past Medical History:  Diagnosis Date  . Anemia, iron deficiency   . Antiphospholipid syndrome (HCC)    with hypercoagulable state  . Asthma    extrinsic; moderate, persistant, nml spirometry 2010, nml CXR 1/08  . Bladder troubles    REPORTS INFECTIONS ON OCCASION DUE TO URETHRA MEATUS STRICTURE AT BIRTH   . Blood dyscrasia    antiphospholipid disorder  . CAD (coronary artery disease)    50% mid LAD, 80% ostial D1 and moderate 80%  mid circ by vath 2003  . Cancer ([place])    basal cell removed fr. L arm   . Complication of anesthesia    cardiac arrest- in OR, at age 15 (58)y.o. during Scalenotomy in her early 20's  . Coronary heart disease   . Drug allergy    heparin/lovenox  . DVT (deep venous thrombosis) ([place])   . Erosive gastritis 1994  . Family history of adverse reaction to anesthesia    some family members have had trouble waking up  . GERD (gastroesophageal reflux disease)   . H/O hiatal hernia   . HLD (hyperlipidemia)   . HTN (hypertension)    [name] [facility], manages pt.  LOV 03/2014  . Hypothyroidism   . Liver spot    cyst- - no biopsy, but told that its benign   . PAF (paroxysmal atrial fibrillation) (HCC)    not on coumadin therapy  . Pulmonary embolism ([place])    related to back surgery with prior coumadin use, now off  . Spondylosis, lumbosacral    ARTHRITIS- OA     Past Surgical History:  Procedure Laterality Date  . ABDOMINAL HYSTERECTOMY     partial abdominal -1998  . BACK SURGERY     x13 cervical and lumbar thoracic spine surgery  . BREAST ENHANCEMENT SURGERY    . BREAST SURGERY     all benign cysts x3   . CARDIAC CATHETERIZATION     2005  . cleft lip and palate repair     70 yo   . hysterectomy    . IR GENERIC HISTORICAL  06/04/2016   IR PERC CHOLECYSTOSTOMY 06/04/2016 Greggory Keen, MD [facility]  . IR GENERIC HISTORICAL  08/06/2016   IR CHOLANGIOGRAM EXISTING TUBE 08/06/2016 Aletta Edouard, MD [facility]  . JOINT REPLACEMENT    . POSTERIOR CERVICAL FUSION/FORAMINOTOMY Right 08/31/2014   Procedure: Right cervical six-seven foraminotomy, Cervical five-Thoracic one fusion, Cervical six-seven lateral mass screws;  Surgeon: Erline Levine, MD;  Location: [facility];  Service: Neurosurgery;  Laterality: Right;  . spina bifida repair     70 yo   . TONSILLECTOMY    . TOTAL KNEE ARTHROPLASTY     left    Social History  Substance Use Topics  . Smoking status: Never Smoker   . Smokeless tobacco: Never Used     Comment: no smoking   . Alcohol use No    Family History  Problem Relation Age of Onset  . Heart disease Father     MI   . Heart disease Sister     Allergies  Allergen Reactions  . Enoxaparin Sodium Anaphylaxis  . Heparin Anaphylaxis  . [place] Venom Anaphylaxis  . Other Anaphylaxis    Sensitive to dye in Betadine & Chlorohexadine   . Pork-Derived Products Anaphylaxis  .  Chlorhexidine Itching    Reports that it is the dye in it  . Lactose Intolerance (Gi) Diarrhea and Nausea And Vomiting    Can tolerate most [place] cheeses (Takes Lactaid)  . Indomethacin Rash  . Phenylpropanolamine Hypertension  . Povidone Iodine Rash  . Povidone-Iodine Itching and Rash    Sensitivity- but if its wiped off she is able to tolerate betadine     Medication list has been reviewed and updated.  Current Outpatient Prescriptions on File Prior to Visit  Medication Sig Dispense Refill  . albuterol (PROVENTIL HFA;VENTOLIN HFA) 108 (90 BASE) MCG/ACT inhaler Inhale 2 puffs into the lungs every 6 (six) hours as needed for wheezing or shortness of breath.     Marland Kitchen amiodarone (PACERONE) 200 MG tablet Take 1 tablet (200 mg total) by mouth daily. (Patient not taking: Reported on 08/04/2016)    . aspirin 81 MG EC tablet Take 81 mg by mouth daily.     Marland Kitchen atorvastatin (LIPITOR) 40 MG tablet Take 40 mg by mouth daily.    Marland Kitchen azelastine (ASTELIN) 137 MCG/SPRAY nasal spray Place 1 spray into the nose 2 (two) times daily.     . beclomethasone (QVAR) 80 MCG/ACT inhaler Inhale 2 puffs into the lungs 2 (two) times daily. (Patient taking differently: Inhale 2 puffs into the lungs See admin instructions. Two to three times a day) 1 Inhaler 11  . Calcium Carb-Cholecalciferol (CALCIUM 500 +D) 500-400 MG-UNIT TABS Take 1 tablet by mouth 2 (two) times daily.    . diazepam (VALIUM) 5 MG tablet Take 5 mg by mouth at bedtime as needed for sleep.    Marland Kitchen docusate sodium (COLACE) 100 MG capsule Take  200 mg by mouth 2 (two) times daily.     Marland Kitchen EPINEPHrine (EPIPEN) 0.3 mg/0.3 mL DEVI Inject 0.3 mLs (0.3 mg total) into the muscle once. (Patient taking differently: Inject 0.3 mg into the muscle once as needed. ) 1 Device 2  . esomeprazole (NEXIUM) 40 MG capsule Take 40 mg by mouth 2 (two) times daily.     . fentaNYL (DURAGESIC - DOSED MCG/HR) 12 MCG/HR Place 12 mcg onto the skin every 3 (three) days. IN CONJUNCTION WITH ONE 50 MCG PATCH TO EQUAL A TOTAL OF 62 MICROGRAMS    . fentaNYL (DURAGESIC - DOSED MCG/HR) 50 MCG/HR Place 50 mcg onto the skin every 3 (three) days. IN CONJUNCTION WITH ONE 12 MCG PATCH TO EQUAL A TOTAL OF 62 MICROGRAMS    . gabapentin (NEURONTIN) 600 MG tablet Take 300 mg by mouth at bedtime.    . Glucosamine-Chondroitin 750-600 MG TABS Take 2 tablets by mouth 2 (two) times daily.     Marland Kitchen HYDROmorphone (DILAUDID) 4 MG tablet Take 1 tablet (4 mg total) by mouth every 4 (four) hours as needed. For pain (Patient taking differently: Take 4-8 mg by mouth every 4 (four) hours as needed (for pain). ) 10 tablet 0  . levothyroxine (SYNTHROID, LEVOTHROID) 75 MCG tablet Take 75 mcg by mouth daily before breakfast.     . lidocaine (LIDODERM) 5 % Place 1 patch onto the skin daily. Remove & Discard patch within 12 hours or as directed by MD    . Loratadine-Pseudoephedrine (CLARITIN-D 12 HOUR PO) Take 1 tablet by mouth 2 (two) times daily.     . magic mouthwash SOLN Take 1 mL by mouth 4 (four) times daily as needed for mouth pain. 15 mL 0  . metaxalone (SKELAXIN) 800 MG tablet Take 800 mg by mouth every  8 (eight) hours as needed. For muscle pain    . metoprolol (LOPRESSOR) 50 MG tablet Take 1 tablet (50 mg total) by mouth 2 (two) times daily. 180 tablet 3  . montelukast (SINGULAIR) 10 MG tablet Take 10 mg by mouth at bedtime.     . Multiple Vitamin (MULTIVITAMIN WITH MINERALS) TABS Take 1 tablet by mouth daily.    Marland Kitchen NITROSTAT 0.4 MG SL tablet DISSOLVE 1 TABLET UNDER TONGUE AS NEEDED FOR CHEST  PAIN,MAY REPEAT IN5 MINUTES FOR 2 DOSES. 25 tablet 0  . nystatin (MYCOSTATIN) 100000 UNIT/ML suspension Take 5 mLs (500,000 Units total) by mouth 4 (four) times daily as needed (oral thrush). 60 mL 0  . nystatin (MYCOSTATIN/NYSTOP) powder Apply topically to skin folds as needed, up to 4 times daily 15 g 0  . ondansetron (ZOFRAN) 4 MG tablet Take 4 mg by mouth every 8 (eight) hours as needed for nausea/vomiting.    Marland Kitchen Respiratory Therapy Supplies (FLUTTER) DEVI Use as directed 1 each 0  . triamcinolone (NASACORT) 55 MCG/ACT nasal inhaler Place 1-2 sprays into the nose 2 (two) times daily.     Alveda Reasons 20 MG TABS tablet Take 20 mg by mouth daily after breakfast.     No current facility-administered medications on file prior to visit.     Review of Systems:  As per HPI- otherwise negative.   Physical Examination: Blood pressure 118/72, pulse 76, temperature 98.5 F (36.9 C), temperature source Oral, height 5\' 1"  (1.549 m), SpO2 97 %.  Vitals:   08/13/16 1112  Height: 5\' 1"  (1.549 m)   There is no height or weight on file to calculate BMI. Ideal Body Weight: Weight in (lb) to have BMI = 25: 132  GEN: WDWN, NAD, Non-toxic, A & O x 3, looks well, accompanied by her husband today HEENT: Atraumatic, Normocephalic. Neck supple. No masses, No LAD. Ears and Nose: No external deformity. CV: RRR, No M/G/R. No JVD. No thrill. No extra heart sounds. PULM: CTA B, no wheezes, crackles, rhonchi. No retractions. No resp. distress. No accessory muscle use. ABD: S, NT, ND. No rebound. No HSM.  RUQ biliary drain in place, site appears clean and non- infected.   EXTR: No c/c/e NEURO sitting in her scooter today.  PSYCH: Normally interactive. Conversant. Not depressed or anxious appearing.  Calm demeanor.     Assessment and Plan: Ascending cholangitis  Debility  Here today to follow-up from recent hospital stay; she was admitted for 3 days for a non- specific illness and likely infection of some  kind.  However more significantly back in August she was very ill, septic, with ascending cholangitis.  She has a biliary drain that is functioning well, plan to have her gallbladder removed as soon as she is well enough.  She is working on regaining her strength and protein stores.   Her recent CMP and CBC were ok, she is anemic but will not recheck today- last labs only 6 days ago.  Plan to see her in 2 months for a recheck- she hopes that her gallbladder will be out by then Flu shot today See patient instructions for more details.     Signed Lamar Blinks, MD

## 2016-08-13 NOTE — Patient Instructions (Addendum)
Please keep me posted and let me know if you have any symptoms of recurrent illness.  I will be hoping that you continue to do well!  I think your idea of using a small tegaderm over your drain is fine.    Please come and see me in 2 months or so to check on your progress   Flu shot given today

## 2016-08-13 NOTE — Progress Notes (Signed)
Pre visit review using our clinic review tool, if applicable. No additional management support is needed unless otherwise documented below in the visit note. 

## 2016-08-17 DIAGNOSIS — K819 Cholecystitis, unspecified: Secondary | ICD-10-CM | POA: Diagnosis not present

## 2016-08-23 DIAGNOSIS — M4722 Other spondylosis with radiculopathy, cervical region: Secondary | ICD-10-CM | POA: Diagnosis not present

## 2016-08-23 DIAGNOSIS — M963 Postlaminectomy kyphosis: Secondary | ICD-10-CM | POA: Diagnosis not present

## 2016-08-23 DIAGNOSIS — Z79891 Long term (current) use of opiate analgesic: Secondary | ICD-10-CM | POA: Diagnosis not present

## 2016-08-23 DIAGNOSIS — G894 Chronic pain syndrome: Secondary | ICD-10-CM | POA: Diagnosis not present

## 2016-08-24 DIAGNOSIS — J309 Allergic rhinitis, unspecified: Secondary | ICD-10-CM | POA: Diagnosis not present

## 2016-08-24 DIAGNOSIS — Z91030 Bee allergy status: Secondary | ICD-10-CM | POA: Diagnosis not present

## 2016-08-24 DIAGNOSIS — J4530 Mild persistent asthma, uncomplicated: Secondary | ICD-10-CM | POA: Diagnosis not present

## 2016-09-01 IMAGING — CR DG CHEST 2V
2 series · 2 of 2 positions shown · non-contrast
Comparison: 07/20/2014.

CLINICAL DATA: Shortness of breath.

EXAM:
CHEST  2 VIEW

[w chest pa]
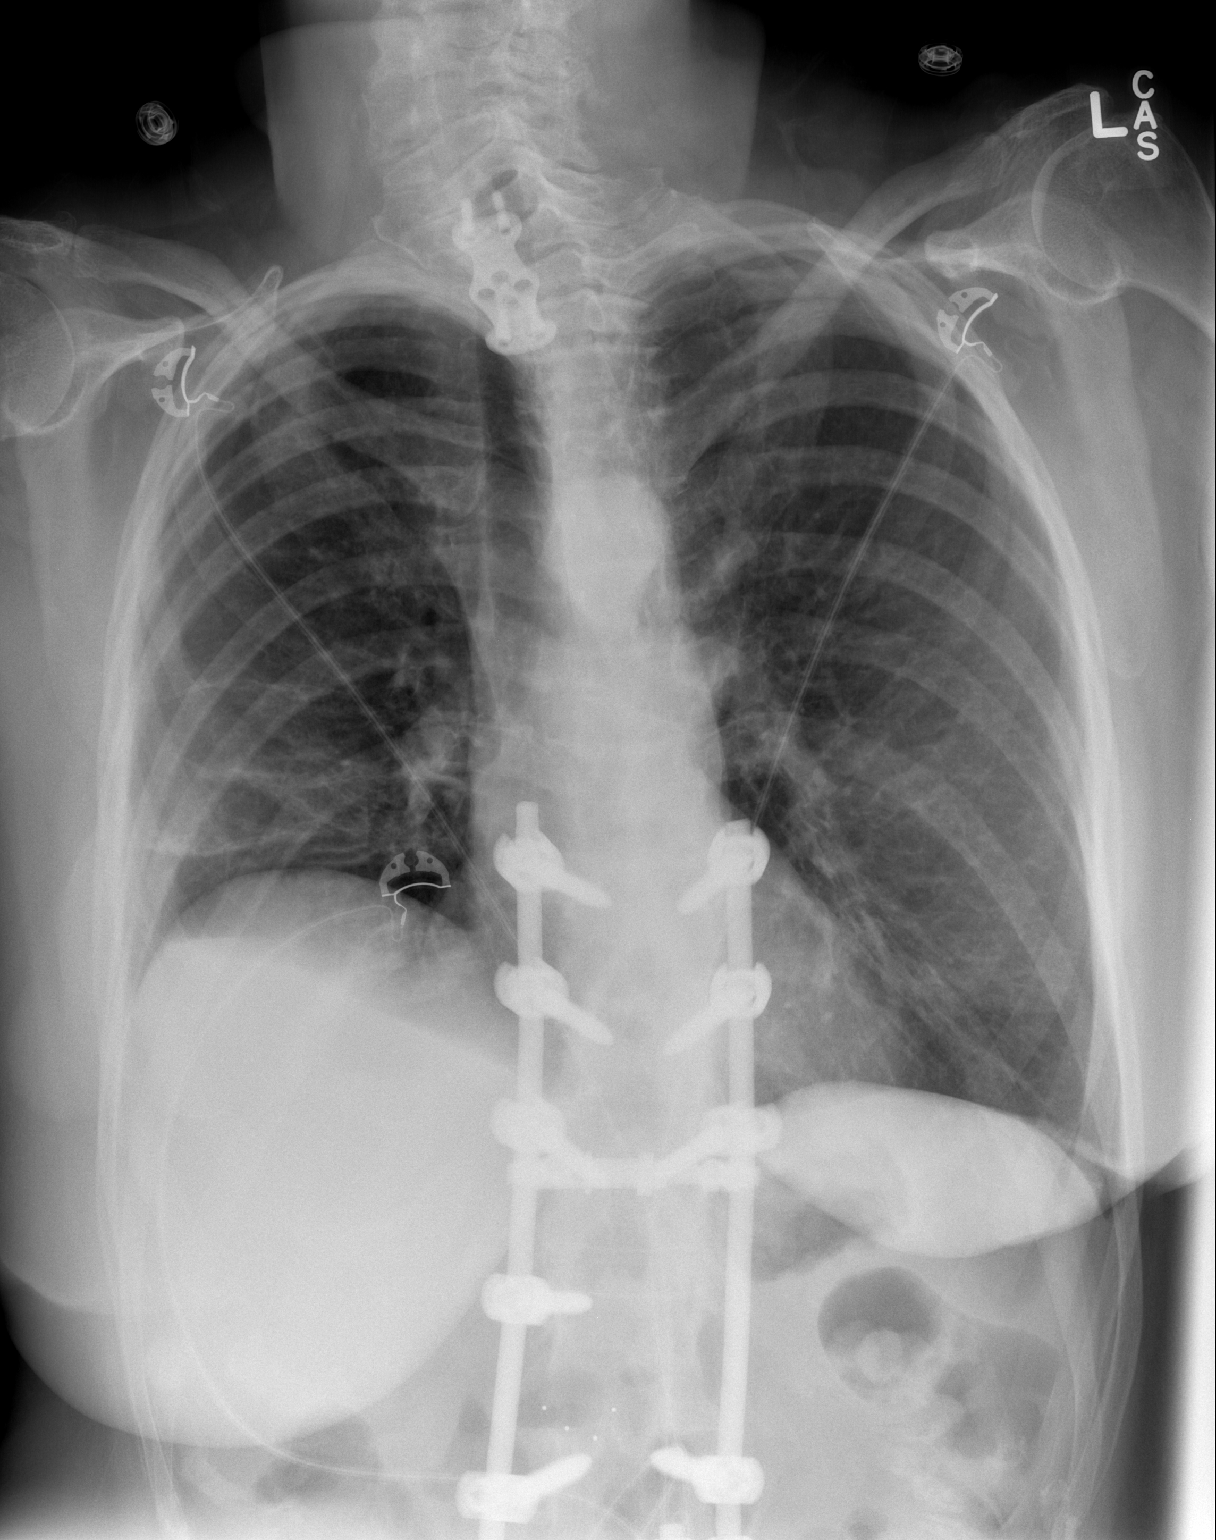

[w chest lat]
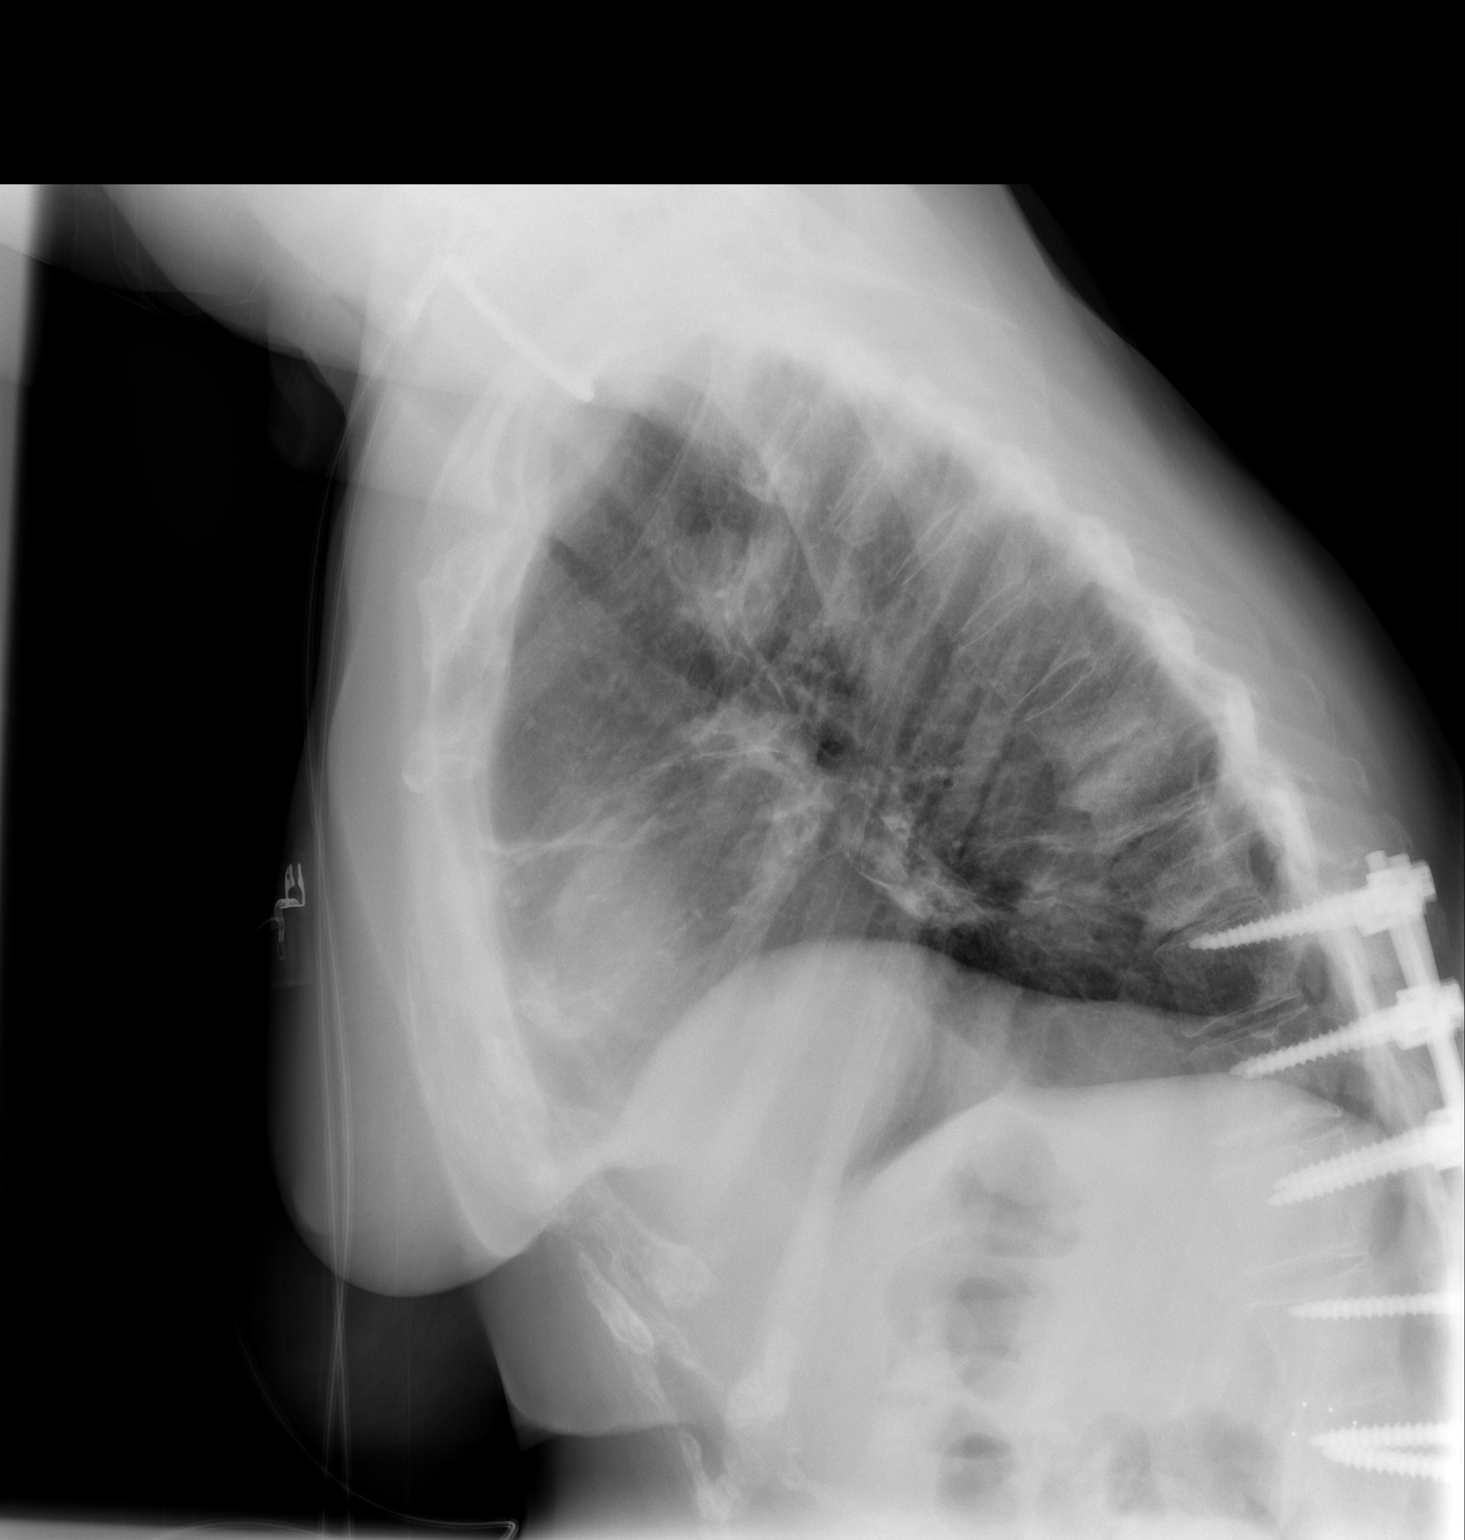

[2 of 2 positions shown; findings below may reference images not displayed]

FINDINGS: Normal sized heart. No significant change in linear density in the
right lower lung zone. The previously seen right pleural fluid is no
longer demonstrated. Clear left lung. Thoracolumbar and cervical
spine fixation hardware.
IMPRESSION: 1. No acute abnormality.
2. Stable linear atelectasis or scarring on the right.
3. Resolved right pleural effusion.

## 2016-09-05 ENCOUNTER — Ambulatory Visit: Payer: Medicare Other | Admitting: Family Medicine

## 2016-09-07 ENCOUNTER — Ambulatory Visit: Payer: Self-pay | Admitting: Surgery

## 2016-09-07 DIAGNOSIS — K811 Chronic cholecystitis: Secondary | ICD-10-CM | POA: Diagnosis not present

## 2016-09-07 NOTE — H&P (Signed)
Latoya [name] 09/07/2016 11:17 AM Location: [facility] Patient #: [id number] DOB: 22-Apr-1946 Married / Language: English / Race: White Female  History of Present Illness Latoya Moores [name] MD; 09/07/2016 12:26 PM) Patient words: Patient returns for follow-up after cholecystostomy tube placement in August. She is recovering nicely and is much less debilitated. She is in good spirits and is eager to start back swelling. Denies any significant abdominal pain. Drain site is clean and is not giving her too much trouble.  The patient is a 70 year old female.   Other Problems Latoya [name], [place]; 09/07/2016 11:18 AM) Arthritis Asthma Atrial Fibrillation Gastroesophageal Reflux Disease General anesthesia - complications Hypercholesterolemia Pulmonary Embolism / Blood Clot in Legs Transfusion history  Past Surgical History Latoya [name], CMA; 09/07/2016 11:18 AM) Breast Mass; Local Excision Bilateral. Knee Surgery Left. Oral Surgery Spinal Surgery - Neck Spinal Surgery Midback Tonsillectomy  Diagnostic Studies History Latoya [name], [place]; 09/07/2016 11:18 AM) Colonoscopy 1-5 years ago Mammogram within last year Pap Smear >5 years ago  Allergies Latoya [name], CMA; 09/07/2016 11:18 AM) Betadine *ANTISEPTICS & DISINFECTANTS* Chlorhexidine Diacetate *CHEMICALS* Heparin Sodium *CHEMICALS* Indomethacin *ANALGESICS - ANTI-INFLAMMATORY* Lovenox *ANTICOAGULANTS* Phenylpropanolamine-Aspirin *COUGH/COLD/ALLERGY* LACTOSE PORK BEE VENOM  Medication History Latoya [name], CMA; 09/07/2016 11:19 AM) EPINEPHrine HCl (1MG /ML Solution, Injection AS needed) Active. Nystatin (500000 UNIT Tablet, Oral Four times daily) Active. Qvar (40MCG/ACT Aerosol Soln, Inhalation) Active. Calcium (500MG  Tablet, Oral) Active. Stool Softener (100MG  Tablet, Oral) Active. Azelastine HCl (0.1% Solution, Nasal) Active. Alavert (10MG  Tablet, Oral TWo times  daily) Active. Lidocaine (5% Patch, External) Active. Nasal (0.65% Solution, Nasal) Active. Metoprolol Tartrate (50MG  Tablet, Oral) Active. Esomeprazole Magnesium (40MG  Capsule DR, Oral) Active. Glucosamine Chondr 500 Complex (Oral TWo times daily) Active. Nasacort Allergy 24HR (55MCG/ACT Aerosol, Nasal) Active. Ferrex 150 (150MG  Capsule, Oral) Active. Synthroid (75MCG Tablet, Oral) Active. FentaNYL (37.5MCG/HR Patch 72HR, Transdermal) Active. Montelukast Sodium (10MG  Tablet, Oral) Active. Melatonin (5MG  Capsule, Oral) Active. Duragesic-12 (12MCG/HR Patch 72HR, Transdermal) Active. Aspirin (81MG  Tablet, Oral) Active. Atorvastatin Calcium (40MG  Tablet, Oral) Active. Multiple Vitamin (Oral) Active. Sorbitol (70% Solution,) Active. Potassium Chloride (20MEQ Tablet ER, Oral) Active. Lasix (20MG  Tablet, Oral) Active. Aldactone (25MG  Tablet, Oral) Active. Medications Reconciled  Social History Latoya [name], [place]; 09/07/2016 11:18 AM) Caffeine use Tea. No alcohol use No drug use Tobacco use Never smoker.  Family History Latoya [name], [place]; 09/07/2016 11:18 AM) Alcohol Abuse Brother. Arthritis Brother, Father. Colon Cancer Sister. Heart Disease Father. Heart disease in female family member before age 2 Heart disease in female family member before age 40 Hypertension Father. Thyroid problems Mother.  Pregnancy / Birth History Latoya [name], [place]; 09/07/2016 11:18 AM) Age at menarche 61 years. Contraceptive History Oral contraceptives. Gravida 2 Maternal age 52-30 Para 0 Regular periods     Review of Systems Latoya [name] CMA; 09/07/2016 11:18 AM) General Present- Appetite Loss and Chills. Not Present- Fatigue, Fever, Night Sweats, Weight Gain and Weight Loss. Skin Present- Dryness. Not Present- Change in Wart/Mole, Hives, Jaundice, New Lesions, Non-Healing Wounds, Rash and Ulcer. HEENT Present- Hoarseness, Seasonal Allergies, Sinus  Pain, Sore Throat and Wears glasses/contact lenses. Not Present- Earache, Hearing Loss, Nose Bleed, Oral Ulcers, Ringing in the Ears, Visual Disturbances and Yellow Eyes. Respiratory Not Present- Bloody sputum, Chronic Cough, Difficulty Breathing, Snoring and Wheezing. Breast Not Present- Breast Mass, Breast Pain, Nipple Discharge and Skin Changes. Cardiovascular Present- Palpitations. Not Present- Chest Pain, Difficulty Breathing Lying Down, Leg Cramps, Rapid Heart Rate, Shortness of Breath and Swelling of Extremities. Gastrointestinal Present- Abdominal Pain, Difficulty  Swallowing, Excessive gas and Vomiting. Not Present- Bloating, Bloody Stool, Change in Bowel Habits, Chronic diarrhea, Constipation, Gets full quickly at meals, Hemorrhoids, Indigestion, Nausea and Rectal Pain. Female Genitourinary Not Present- Frequency, Nocturia, Painful Urination, Pelvic Pain and Urgency. Musculoskeletal Present- Back Pain, Joint Pain and Joint Stiffness. Not Present- Muscle Pain, Muscle Weakness and Swelling of Extremities. Neurological Present- Weakness. Not Present- Decreased Memory, Fainting, Headaches, Numbness, Seizures, Tingling, Tremor and Trouble walking. Psychiatric Not Present- Anxiety, Bipolar, Change in Sleep Pattern, Depression, Fearful and Frequent crying. Endocrine Present- Hair Changes. Not Present- Cold Intolerance, Excessive Hunger, Heat Intolerance, Hot flashes and New Diabetes. Hematology Present- Blood Thinners and Easy Bruising. Not Present- Excessive bleeding, Gland problems, HIV and Persistent Infections.  Vitals Bary Castilla Bradford CMA; 09/07/2016 11:20 AM) 09/07/2016 11:19 AM Weight: 162.4 lb Height: 62in Body Surface Area: 1.75 m Body Mass Index: 29.7 kg/m  Temp.: 98.21F  Pulse: 88 (Regular)  BP: 126/78 (Sitting, Left Arm, Standard)      Physical Exam (Vi Biddinger A. Sacoya Mcgourty MD; 09/07/2016 12:26 PM)  General Mental Status-Alert. General Appearance-Consistent with  stated age. Hydration-Well hydrated. Voice-Normal.  Head and Neck Head-normocephalic, atraumatic with no lesions or palpable masses.  Chest and Lung Exam Chest and lung exam reveals -quiet, even and easy respiratory effort with no use of accessory muscles and on auscultation, normal breath sounds, no adventitious sounds and normal vocal resonance. Inspection Chest Wall - Normal. Back - normal.  Cardiovascular Cardiovascular examination reveals -on palpation PMI is normal in location and amplitude, no palpable S3 or S4. Normal cardiac borders., normal heart sounds, regular rate and rhythm with no murmurs, carotid auscultation reveals no bruits and normal pedal pulses bilaterally.  Abdomen Note: Drains are intact. No significant signs of infection. Abdomen soft nontender.  Musculoskeletal Normal Exam - Left-Upper Extremity Strength Normal and Lower Extremity Strength Normal. Normal Exam - Right-Upper Extremity Strength Normal, Lower Extremity Weakness.    Assessment & Plan (Letzy Gullickson A. Tevyn Codd MD; 09/07/2016 12:27 PM)  CHOLECYSTITIS, CHRONIC (K81.1) Impression: Given her ultrasound findings of sludge and discussed with the tube versus cholecystectomy. She is a much better shape and desires cholecystectomy at this point in time. Risks and benefits of surgery discussed with her and her husband today. The procedure has been discussed with the patient. Risks of laparoscopic cholecystectomy include bleeding, infection, bile duct injury, leak, death, open surgery, diarrhea, other surgery, organ injury, blood vessel injury, DVT, and additional care.  Current Plans You are being scheduled for surgery - Our schedulers will call you.  You should hear from our office's scheduling department within 5 working days about the location, date, and time of surgery. We try to make accommodations for patient's preferences in scheduling surgery, but sometimes the OR schedule or the surgeon's  schedule prevents [place] from making those accommodations.  If you have not heard from our office [phone]) in 5 working days, call the office and ask for your surgeon's nurse.  If you have other questions about your diagnosis, plan, or surgery, call the office and ask for your surgeon's nurse.  Pt Education - Pamphlet Given - Laparoscopic Gallbladder Surgery: discussed with patient and provided information. The anatomy & physiology of hepatobiliary & pancreatic function was discussed. The pathophysiology of gallbladder dysfunction was discussed. Natural history risks without surgery was discussed. I feel the risks of no intervention will lead to serious problems that outweigh the operative risks; therefore, I recommended cholecystectomy to remove the pathology. I explained laparoscopic techniques with possible need for an open approach.  Probable cholangiogram to evaluate the bilary tract was explained as well.  Risks such as bleeding, infection, abscess, leak, injury to other organs, need for further treatment, heart attack, death, and other risks were discussed. I noted a good likelihood this will help address the problem. Possibility that this will not correct all abdominal symptoms was explained. Goals of post-operative recovery were discussed as well. We will work to minimize complications. An educational handout further explaining the pathology and treatment options was given as well. Questions were answered. The patient expresses understanding & wishes to proceed with surgery.  Pt Education - [facility] Laparosopic Post Op HCI ([place]) Pt Education - Laparoscopic Cholecystectomy: gallbladder

## 2016-09-12 ENCOUNTER — Encounter: Payer: Self-pay | Admitting: Cardiovascular Disease

## 2016-09-12 ENCOUNTER — Ambulatory Visit (INDEPENDENT_AMBULATORY_CARE_PROVIDER_SITE_OTHER): Payer: Medicare Other | Admitting: Cardiovascular Disease

## 2016-09-12 VITALS — BP 120/72 | HR 92 | Ht 62.0 in | Wt 166.4 lb

## 2016-09-12 DIAGNOSIS — Z01810 Encounter for preprocedural cardiovascular examination: Secondary | ICD-10-CM

## 2016-09-12 DIAGNOSIS — I6523 Occlusion and stenosis of bilateral carotid arteries: Secondary | ICD-10-CM

## 2016-09-12 DIAGNOSIS — I480 Paroxysmal atrial fibrillation: Secondary | ICD-10-CM | POA: Diagnosis not present

## 2016-09-12 DIAGNOSIS — I10 Essential (primary) hypertension: Secondary | ICD-10-CM

## 2016-09-12 DIAGNOSIS — I2510 Atherosclerotic heart disease of native coronary artery without angina pectoris: Secondary | ICD-10-CM

## 2016-09-12 NOTE — Patient Instructions (Signed)
Medication Instructions:  Your physician recommends that you continue on your current medications as directed. Please refer to the Current Medication list given to you today.   Labwork: none  Testing/Procedures: none  Follow-Up: Your physician recommends that you schedule a follow-up appointment in: 6 months.  Please call us in about 3 months to schedule this.   Any Other Special Instructions Will Be Listed Below (If Applicable).     If you need a refill on your cardiac medications before your next appointment, please call your pharmacy.

## 2016-09-12 NOTE — Progress Notes (Signed)
Chief Complaint  Patient presents with  . Coronary Artery Disease    surgical clearance    History of Present Illness: 70 yo female with history of PAF, HTN, hyperlipidemia, anti-phospholipid antibody syndrome, GERD, asthma, CAD here today for cardiac follow up. Cardiac cath 2003 with 30% ostial LM stenosis, 50% mid LAD, 80% moderate sized Diagonal, 80% Circumflex. There was discussion about bypass at that time but medical management was pursued and she has done well with this plan over the years. She also has chronic back pain. She has a hypercoagulable state. She is now on Xarelto. Most recent stress test October 2013 with no ischemia, normal LV function.  Last echo August 2017 with LVEF=50%, moderate mitral regurgitation. Carotid artery dopplers April 2016 with stable 40-59% bilateral stenosis. She was admitted to [facility] August 2017 with sepsis, ascending cholangitis complicated by respiratory failure, renal failure, CHF. Readmitted to [facility] October 2017 with nausea and vomiting.   She tells me today that she has been feeling well. She has had no chest pain or SOB. No LE edema. She has some soreness in her abdomen at the location of her drain. No bleeding issues on Xarelto.  Primary Care Physician: Thressa Sheller, MD  Past Medical History:  Diagnosis Date  . Anemia, iron deficiency   . Antiphospholipid syndrome (HCC)    with hypercoagulable state  . Asthma    extrinsic; moderate, persistant, nml spirometry 2010, nml CXR 1/08  . Bladder troubles    REPORTS INFECTIONS ON OCCASION DUE TO URETHRA MEATUS STRICTURE AT BIRTH   . Blood dyscrasia    antiphospholipid disorder  . CAD (coronary artery disease)    50% mid LAD, 80% ostial D1 and moderate 80% mid circ by vath 2003  . Cancer ([place])    basal cell removed fr. L arm   . Complication of anesthesia    cardiac arrest- in OR, at age 4 (51)y.o. during Scalenotomy in her early 20's  . Coronary heart disease   . Drug allergy      heparin/lovenox  . DVT (deep venous thrombosis) ([place])   . Erosive gastritis 1994  . Family history of adverse reaction to anesthesia    some family members have had trouble waking up  . GERD (gastroesophageal reflux disease)   . H/O hiatal hernia   . HLD (hyperlipidemia)   . HTN (hypertension)    [name] at [facility], manages pt.  LOV 03/2014  . Hypothyroidism   . Liver spot    cyst- - no biopsy, but told that its benign   . PAF (paroxysmal atrial fibrillation) (HCC)    not on coumadin therapy  . Pulmonary embolism ([place])    related to back surgery with prior coumadin use, now off  . Spondylosis, lumbosacral    ARTHRITIS- OA     Past Surgical History:  Procedure Laterality Date  . ABDOMINAL HYSTERECTOMY     partial abdominal -1998  . BACK SURGERY     x13 cervical and lumbar thoracic spine surgery  . BREAST ENHANCEMENT SURGERY    . BREAST SURGERY     all benign cysts x3   . CARDIAC CATHETERIZATION     2005  . cleft lip and palate repair     70 yo   . hysterectomy    . IR GENERIC HISTORICAL  06/04/2016   IR PERC CHOLECYSTOSTOMY 06/04/2016 Greggory Keen, MD [facility]  . IR GENERIC HISTORICAL  08/06/2016   IR CHOLANGIOGRAM EXISTING TUBE 08/06/2016 Aletta Edouard, MD  [facility]  . JOINT REPLACEMENT    . POSTERIOR CERVICAL FUSION/FORAMINOTOMY Right 08/31/2014   Procedure: Right cervical six-seven foraminotomy, Cervical five-Thoracic one fusion, Cervical six-seven lateral mass screws;  Surgeon: Erline Levine, MD;  Location: [facility];  Service: Neurosurgery;  Laterality: Right;  . spina bifida repair     70 yo   . TONSILLECTOMY    . TOTAL KNEE ARTHROPLASTY     left    Current Outpatient Prescriptions  Medication Sig Dispense Refill  . albuterol (PROVENTIL HFA;VENTOLIN HFA) 108 (90 BASE) MCG/ACT inhaler Inhale 2 puffs into the lungs every 6 (six) hours as needed for wheezing or shortness of breath.     Marland Kitchen aspirin 81 MG EC tablet Take 81 mg by mouth daily.     Marland Kitchen  atorvastatin (LIPITOR) 40 MG tablet Take 40 mg by mouth daily.    Marland Kitchen azelastine (ASTELIN) 137 MCG/SPRAY nasal spray Place 1 spray into the nose 2 (two) times daily.     . beclomethasone (QVAR) 80 MCG/ACT inhaler Inhale 2 puffs into the lungs 3 (three) times daily.    . Calcium Carb-Cholecalciferol (CALCIUM 500 +D) 500-400 MG-UNIT TABS Take 1 tablet by mouth 2 (two) times daily.    . diazepam (VALIUM) 5 MG tablet Take 5 mg by mouth at bedtime as needed for sleep.    Marland Kitchen docusate sodium (COLACE) 100 MG capsule Take 200 mg by mouth 2 (two) times daily.     Marland Kitchen EPINEPHrine 0.3 mg/0.3 mL IJ SOAJ injection Inject 0.3 mg into the muscle once. As needed for anaphylaxis    . esomeprazole (NEXIUM) 40 MG capsule Take 40 mg by mouth 2 (two) times daily.     . fentaNYL (DURAGESIC - DOSED MCG/HR) 12 MCG/HR Place 12 mcg onto the skin every 3 (three) days. IN CONJUNCTION WITH ONE 50 MCG PATCH TO EQUAL A TOTAL OF 62 MICROGRAMS    . fentaNYL (DURAGESIC - DOSED MCG/HR) 50 MCG/HR Place 50 mcg onto the skin every 3 (three) days. IN CONJUNCTION WITH ONE 12 MCG PATCH TO EQUAL A TOTAL OF 62 MICROGRAMS    . gabapentin (NEURONTIN) 600 MG tablet Take 300 mg by mouth at bedtime.    . Glucosamine-Chondroitin 750-600 MG TABS Take 2 tablets by mouth 2 (two) times daily.     Marland Kitchen HYDROmorphone (DILAUDID) 4 MG tablet Take 4-8 mg by mouth every 4 (four) hours as needed (pain).    Marland Kitchen levothyroxine (SYNTHROID, LEVOTHROID) 75 MCG tablet Take 75 mcg by mouth daily before breakfast.     . lidocaine (LIDODERM) 5 % Place 1 patch onto the skin daily. Remove & Discard patch within 12 hours or as directed by MD    . Loratadine-Pseudoephedrine (CLARITIN-D 12 HOUR PO) Take 1 tablet by mouth 2 (two) times daily.     . metaxalone (SKELAXIN) 800 MG tablet Take 800 mg by mouth every 8 (eight) hours as needed. For muscle pain    . metoprolol (LOPRESSOR) 50 MG tablet Take 1 tablet (50 mg total) by mouth 2 (two) times daily. 180 tablet 3  . montelukast  (SINGULAIR) 10 MG tablet Take 10 mg by mouth at bedtime.     . Multiple Vitamin (MULTIVITAMIN WITH MINERALS) TABS Take 1 tablet by mouth daily.    Marland Kitchen NITROSTAT 0.4 MG SL tablet DISSOLVE 1 TABLET UNDER TONGUE AS NEEDED FOR CHEST PAIN,MAY REPEAT IN5 MINUTES FOR 2 DOSES. 25 tablet 0  . nystatin (MYCOSTATIN/NYSTOP) powder Apply topically to skin folds as needed, up to  4 times daily 15 g 0  . Respiratory Therapy Supplies (FLUTTER) DEVI Use as directed 1 each 0  . triamcinolone (NASACORT) 55 MCG/ACT nasal inhaler Place 1-2 sprays into the nose 2 (two) times daily.     Alveda Reasons 20 MG TABS tablet Take 20 mg by mouth daily after breakfast.     No current facility-administered medications for this visit.     Allergies  Allergen Reactions  . Enoxaparin Sodium Anaphylaxis  . Heparin Anaphylaxis  . Hornet Venom Anaphylaxis  . Other Anaphylaxis    Sensitive to dye in Betadine & Chlorohexadine   . Pork-Derived Products Anaphylaxis  . Chlorhexidine Itching    Reports that it is the dye in it  . Lactose Intolerance (Gi) Diarrhea and Nausea And Vomiting    Can tolerate most [place] cheeses (Takes Lactaid)  . Indomethacin Rash  . Phenylpropanolamine Hypertension  . Povidone Iodine Rash  . Povidone-Iodine Itching and Rash    Sensitivity- but if its wiped off she is able to tolerate betadine     Social History   Social History  . Marital status: Married    Spouse name: N/A  . Number of children: N/A  . Years of education: N/A   Occupational History  . Not on file.   Social History Main Topics  . Smoking status: Never Smoker  . Smokeless tobacco: Never Used     Comment: no smoking   . Alcohol use No  . Drug use: No  . Sexual activity: Not on file   Other Topics Concern  . Not on file   Social History Narrative   Lives in [place] with husband; has had miscarriages but no children.    Disabled but exercises nearly every day (can only exericse in water - aerobics)   Takes opioids for pain  relief; takes no herbal medications; has a heart healthy diet.     Family History  Problem Relation Age of Onset  . Heart disease Father     MI   . Heart disease Sister     Review of Systems:  As stated in the HPI and otherwise negative.   BP 120/72   Pulse 92   Ht 5\' 2"  (1.575 m)   Wt 166 lb 6.4 oz (75.5 kg)   BMI 30.43 kg/m   Physical Examination: General: Well developed, well nourished, NAD  HEENT: OP clear, mucus membranes moist  SKIN: warm, dry. No rashes. Neuro: No focal deficits  Musculoskeletal: Muscle strength 5/5 all ext  Psychiatric: Mood and affect normal  Neck: No JVD, no carotid bruits, no thyromegaly, no lymphadenopathy.  Lungs:Clear bilaterally, no wheezes, rhonci, crackles Cardiovascular: Regular rate and rhythm. No murmurs, gallops or rubs. Abdomen:Soft. Bowel sounds present. Non-tender.  Extremities: No lower extremity edema. Pulses are trace in the bilateral DP/PT.    Echo August 2017: Left ventricle: The cavity size was normal. Wall thickness was at   the upper limits of normal. The estimated ejection fraction was   50%. Wall motion was normal; there were no regional wall motion   abnormalities. Features are consistent with a pseudonormal left   ventricular filling pattern, with concomitant abnormal relaxation   and increased filling pressure (grade 2 diastolic dysfunction). - Mitral valve: Mildly thickened leaflets . There was moderate   regurgitation directed posteriorly. - Left atrium: The atrium was moderately dilated. - Tricuspid valve: There was trivial regurgitation. - Pulmonary arteries: Systolic pressure could not be accurately   estimated. - Pericardium, extracardiac: There was  no pericardial effusion. Impressions: - Upper normal wall thickness with LVEF approximately 50%. Grade 2   diastolic dysfunction with increased LV filling pressure.  Carotid artery dopplers April 2016: Moderate bilateral stenosis, 40-59% stenosis bilateral  ICA  Stress myoview: 08/13/12: Stress Procedure: The patient received IV Lexiscan 0.4 mg over 15-seconds. Technetium 58m Sestamibi injected at 30-seconds. There were significant changes with Lexiscan. The patient complained of chest pain with Lexiscan. Quantitative spect images were obtained after a 45 minute delay.  The patient complained of persistent headache with flushing of face and neck after Lexiscan that was relieved quickly after Aminophylline 75 mg IVP given.  Stress ECG: No significant change from baseline ECG  QPS  Raw Data Images: Patient motion noted.  Stress Images: lateral wall not interpretable due to bowel artifact  Rest Images: lateral wall not interpretable due to bowel artifact  Subtraction (SDS): Normal  Transient Ischemic Dilatation (Normal <1.22): 1.03  Lung/Heart Ratio (Normal <0.45): 0.45  Quantitative Gated Spect Images  QGS EDV: 95 ml  QGS ESV: 50 ml  Impression  Exercise Capacity: Lexiscan with no exercise.  BP Response: Normal blood pressure response.  Clinical Symptoms: Headache requiring amynophylline  ECG Impression: No significant ST segment change suggestive of ischemia.  Comparison with Prior Nuclear Study: No images to compare  Overall Impression: Apical thinning no obvious ischemia Lateral wall not interpretable due to a large area of bowel artifact on both resting and stress images  LV Ejection Fraction: 47%. LV Wall Motion: Apical hypokinesis  Cardiac cath 09/22/02: 1. Ventriculography was performed in the RAO projection. Overall ejection  fraction was 70%. No segmental wall motion abnormalities were  identified.  2. The left main coronary artery has, what appears to be, about 30%  narrowing at the ostium. In most views there does appear to be a patent  ostium; however, some tapered narrowing cannot be excluded.  3. The LAD proper has about 40-50% narrowing, at most, in the mid portion  beyond the diagonal takeoff. The vessel is calcified. The  diagonal  takeoff itself has a long 80% stenosis at the ostium extending out into  the mid portion of the diagonal. The distal diagonal does appear to be  suitable for grafting.  4. The large circumflex has about 70-80% focal narrowing prior to the  bifurcation of the marginal.  5. The right coronary artery has some mild luminal irregularities but no  high-grade stenoses.  IMPRESSION:  1. Coronary artery disease with significant involvement and circumflex  involvement.  2. Multiple medical issues, as described above.  EKG:  EKG is not  ordered today. The ekg ordered today demonstrates   Recent Labs: 06/10/2016: TSH 3.666 06/21/2016: Magnesium 1.8 08/06/2016: ALT 12; BUN <5; Creatinine, Ser 0.66; Potassium 3.9; Sodium 142 08/07/2016: Hemoglobin 10.1; Platelets 225   Lipid Panel Followed in primary care   Wt Readings from Last 3 Encounters:  09/12/16 166 lb 6.4 oz (75.5 kg)  08/06/16 167 lb 3.2 oz (75.8 kg)  06/21/16 171 lb 12.8 oz (77.9 kg)     Other studies Reviewed: Additional studies/ records that were reviewed today include: . Review of the above records demonstrates:    Assessment and Plan:   1. CAD: No recent chest pain suggestive of angina. Echo August 2017 with normal LV function. She is known to have moderate CAD by cath in 2003. Continue current medical management   2. PAROXYSMAL ATRIAL FIBRILLATION: She appears to be in sinus today. Occasional palpitations. Will continue amiodarone that was  started in the hospital in the setting of sepsis. Will continue Xarelto for anticoagulation.   3. HYPERTENSION: BP is controlled. No changes  4. CAROTID ARTERY DISEASE: Stable by dopplers April 2016. Repeat April 2018.   5. Hyperlipidemia: Continue statin. Lipids followed in primary care.   6. Pre-operative Cardiovascular examination: She has no chest pain, dyspnea. She is limited by her joint issues. Echo August 2017 with normal LV function. She can proceed with her planned  surgical procedure. OK to hold Xarelto 2 days before her planned procedure.   Current medicines are reviewed at length with the patient today.  The patient does not have concerns regarding medicines.  The following changes have been made:  no change  Labs/ tests ordered today include:  No orders of the defined types were placed in this encounter.    Disposition:   FU with me in 6 months   Signed, Lauree Chandler, MD 09/12/2016 1:37 PM    [facility] [address], [place], [place]  [address] Phone: [phone]; Fax: [phone]

## 2016-09-20 DIAGNOSIS — M963 Postlaminectomy kyphosis: Secondary | ICD-10-CM | POA: Diagnosis not present

## 2016-09-20 DIAGNOSIS — G894 Chronic pain syndrome: Secondary | ICD-10-CM | POA: Diagnosis not present

## 2016-09-20 DIAGNOSIS — M4722 Other spondylosis with radiculopathy, cervical region: Secondary | ICD-10-CM | POA: Diagnosis not present

## 2016-09-20 DIAGNOSIS — Z79891 Long term (current) use of opiate analgesic: Secondary | ICD-10-CM | POA: Diagnosis not present

## 2016-09-25 ENCOUNTER — Telehealth: Payer: Self-pay | Admitting: Family Medicine

## 2016-09-25 DIAGNOSIS — E119 Type 2 diabetes mellitus without complications: Secondary | ICD-10-CM | POA: Insufficient documentation

## 2016-09-25 DIAGNOSIS — D6859 Other primary thrombophilia: Secondary | ICD-10-CM

## 2016-09-25 DIAGNOSIS — R7303 Prediabetes: Secondary | ICD-10-CM

## 2016-09-25 NOTE — Telephone Encounter (Signed)
Received records from [facility], will abstract/ scan

## 2016-09-26 ENCOUNTER — Encounter (HOSPITAL_COMMUNITY)
Admission: RE | Admit: 2016-09-26 | Discharge: 2016-09-26 | Disposition: A | Discharge status: Home / Self Care | Payer: Medicare Other | Source: Ambulatory Visit | Attending: Surgery | Admitting: Surgery

## 2016-09-26 ENCOUNTER — Encounter (HOSPITAL_COMMUNITY): Payer: Self-pay

## 2016-09-26 DIAGNOSIS — I480 Paroxysmal atrial fibrillation: Secondary | ICD-10-CM | POA: Diagnosis not present

## 2016-09-26 DIAGNOSIS — Z86718 Personal history of other venous thrombosis and embolism: Secondary | ICD-10-CM | POA: Insufficient documentation

## 2016-09-26 DIAGNOSIS — I10 Essential (primary) hypertension: Secondary | ICD-10-CM | POA: Insufficient documentation

## 2016-09-26 DIAGNOSIS — K219 Gastro-esophageal reflux disease without esophagitis: Secondary | ICD-10-CM | POA: Diagnosis not present

## 2016-09-26 DIAGNOSIS — Z86711 Personal history of pulmonary embolism: Secondary | ICD-10-CM | POA: Diagnosis not present

## 2016-09-26 DIAGNOSIS — Z01812 Encounter for preprocedural laboratory examination: Secondary | ICD-10-CM | POA: Diagnosis not present

## 2016-09-26 DIAGNOSIS — J45909 Unspecified asthma, uncomplicated: Secondary | ICD-10-CM | POA: Insufficient documentation

## 2016-09-26 DIAGNOSIS — E785 Hyperlipidemia, unspecified: Secondary | ICD-10-CM | POA: Insufficient documentation

## 2016-09-26 DIAGNOSIS — Z9889 Other specified postprocedural states: Secondary | ICD-10-CM | POA: Insufficient documentation

## 2016-09-26 DIAGNOSIS — I2510 Atherosclerotic heart disease of native coronary artery without angina pectoris: Secondary | ICD-10-CM | POA: Insufficient documentation

## 2016-09-26 HISTORY — DX: Cardiac murmur, unspecified: R01.1

## 2016-09-26 LAB — COMPREHENSIVE METABOLIC PANEL
ALBUMIN: 3.9 g/dL (ref 3.5–5.0)
ALK PHOS: 61 U/L (ref 38–126)
ALT: 14 U/L (ref 14–54)
AST: 25 U/L (ref 15–41)
Anion gap: 12 (ref 5–15)
BUN: 16 mg/dL (ref 6–20)
CALCIUM: 9.9 mg/dL (ref 8.9–10.3)
CO2: 24 mmol/L (ref 22–32)
CREATININE: 0.76 mg/dL (ref 0.44–1.00)
Chloride: 103 mmol/L (ref 101–111)
GFR calc non Af Amer: >60 mL/min (ref >60)
GLUCOSE: 91 mg/dL (ref 65–99)
Potassium: 4.4 mmol/L (ref 3.5–5.1)
SODIUM: 139 mmol/L (ref 135–145)
Total Bilirubin: 0.5 mg/dL (ref 0.3–1.2)
Total Protein: 7.1 g/dL (ref 6.5–8.1)

## 2016-09-26 LAB — CBC WITH DIFFERENTIAL/PLATELET
Basophils Absolute: 0 10*3/uL (ref 0.0–0.1)
Basophils Relative: 1 %
EOS ABS: 0.4 10*3/uL (ref 0.0–0.7)
Eosinophils Relative: 6 %
HEMATOCRIT: 35.2 % — AB (ref 36.0–46.0)
HEMOGLOBIN: 11.6 g/dL — AB (ref 12.0–15.0)
LYMPHS ABS: 2.3 10*3/uL (ref 0.7–4.0)
Lymphocytes Relative: 32 %
MCH: 28.5 pg (ref 26.0–34.0)
MCHC: 33 g/dL (ref 30.0–36.0)
MCV: 86.5 fL (ref 78.0–100.0)
Monocytes Absolute: 0.7 10*3/uL (ref 0.1–1.0)
Monocytes Relative: 9 %
NEUTROS ABS: 3.9 10*3/uL (ref 1.7–7.7)
NEUTROS PCT: 52 %
Platelets: 231 10*3/uL (ref 150–400)
RBC: 4.07 MIL/uL (ref 3.87–5.11)
RDW: 12.9 % (ref 11.5–15.5)
WBC: 7.3 10*3/uL (ref 4.0–10.5)

## 2016-09-26 MED ORDER — CEFAZOLIN SODIUM 10 G IJ SOLR: 3.0000 g | INTRAMUSCULAR | Status: DC

## 2016-09-26 NOTE — Pre-Procedure Instructions (Signed)
    Yasmine Segler Wecker  09/26/2016      [id number] - [place], [place] - [address] [place] [address] Phone: [phone] Fax: [phone]  [facility] #[id number] - [place], [place] [address] [place] [address] Phone: [phone] Fax: [phone]  [place], [place] [address] Phone: [phone] Fax: [phone]    Your procedure is scheduled  Thursday 10/04/2016  Report to [facility] Admitting at  1:00 P.M.  Call this number if you have problems the morning of surgery:  [phone]   Remember:  Do not eat food or drink liquids after midnight.  Take these medicines the morning of surgery with A SIP OF WATER  Atorvastatin, astelin, flexeril valium, colace, nexium, gabapentin, synthroid, lopressor, metaxalone, dilaudid, albuterol beclomethasone and singulair.  Discontinue all herbal vitamin and fish oil supplements, all aspirin, ibuprofen, naproxen products and other NASIDS 5 days prior to surgery   Do not wear jewelry, make-up or nail polish.  Do not wear lotions, powders, or perfumes, or deoderant.  Do not shave 48 hours prior to surgery.    Do not bring valuables to the hospital.  [facility] is not responsible for any belongings or valuables.  Contacts, dentures or bridgework may not be worn into surgery.  Leave your suitcase in the car.  After surgery it may be brought to your room.  For patients admitted to the hospital, discharge time will be determined by your treatment team.  Patients discharged the day of surgery will not be allowed to drive home.   Name and phone number of your driver:   Special instructions:    Please read over the following fact sheets that you were given. Pain Booklet, Coughing and Deep Breathing and Surgical Site Infection Prevention

## 2016-09-27 NOTE — Progress Notes (Signed)
Anesthesia Chart Review:  Pt is a 70 year old female scheduled for laparoscopic cholecystectomy on 10/04/2016 with Erroll Luna, MD.   - Cardiologist is Lauree Chandler, MD who cleared pt at last office visit 09/12/16.  - PCP is Thressa Sheller, MD  PMH includes:  CAD, PAF, HTN, hyperlipidemia, asthma, PE, DVT, antiphospholipid syndrome (hypercoaguable state), GERD. Never smoker. BMI 30. S/p cervical foraminotomy, fusion 08/31/14. S/p lumbar fusion 09/30/12.   BP (!) 174/62   Pulse 78   Temp 36.8 C (Oral)   Resp 20   Ht 5\' 2"  (1.575 m)   Wt 164 lb 9 oz (74.6 kg)   SpO2 100%   BMI 30.10 kg/m    Anesthesia history: pt reports cardiac arrest in OR at age 69  Medications include: Albuterol, ASA, Lipitor, Qvar, Nexium, fentanyl patch, levothyroxin, metoprolol, xarelto. Pt to hold xarelto 2 days before surgery.   Preoperative labs reviewed.    CXR 08/03/16: Mildly elevated right diaphragm. Hazy atelectasis or infiltrate at the right lung base.  EKG 08/03/16: NSR  Echo 06/10/16:  - Left ventricle: The cavity size was normal. Wall thickness was at the upper limits of normal. The estimated ejection fraction was 50%. Wall motion was normal; there were no regional wall motion abnormalities. Features are consistent with a pseudonormal left ventricular filling pattern, with concomitant abnormal relaxation and increased filling pressure (grade 2 diastolic dysfunction). - Mitral valve: Mildly thickened leaflets . There was moderate regurgitation directed posteriorly. - Left atrium: The atrium was moderately dilated. - Tricuspid valve: There was trivial regurgitation. - Pulmonary arteries: Systolic pressure could not be accurately estimated. - Pericardium, extracardiac: There was no pericardial effusion.  Carotid duplex 01/21/15: Stable 40-59% bilateral ICA stenosis, over serial exams.  Nuclear stress test 08/13/12: Apical thinning no obvious ischemia Lateral wall not interpretable due  to a large area of bowel artifact on both resting and stress images. LV Ejection Fraction: 47%.  LV Wall Motion:  Apical hypokinesis  Cardiac cath 09/22/02:  1. Overall ejection fraction was 70%.  No segmental wall motion abnormalities were identified. 2. LM has about 30% narrowing at the ostium.  In most views there does appear to be a patent ostium; however, some tapered narrowing cannot be excluded. 3.LAD 40-50% narrowing in the mid portion  beyond the diagonal takeoff.  The vessel is calcified.  The diagonal     takeoff long 80% stenosis at the ostium extending out into the mid portion of the diagonal.  The distal diagonal does appear to be suitable for grafting. 4. CX has 70-80% focal narrowing prior to the bifurcation of the marginal. 5. RCA has some mild luminal irregularities but no high-grade stenoses.  If no changes, I anticipate pt can proceed with surgery as scheduled.   Willeen Cass, FNP-BC [facility]/Anesthesiology Phone: [phone] 09/27/2016 2:37 PM

## 2016-09-28 ENCOUNTER — Encounter: Payer: Self-pay | Admitting: Family Medicine

## 2016-10-04 ENCOUNTER — Encounter (HOSPITAL_COMMUNITY)
Admission: RE | Disposition: A | Discharge status: Home / Self Care | Payer: Self-pay | Source: Ambulatory Visit | Attending: Surgery

## 2016-10-04 ENCOUNTER — Encounter (HOSPITAL_COMMUNITY): Payer: Self-pay | Admitting: Certified Registered"

## 2016-10-04 ENCOUNTER — Ambulatory Visit (HOSPITAL_COMMUNITY): Payer: Medicare Other | Admitting: Emergency Medicine

## 2016-10-04 ENCOUNTER — Ambulatory Visit (HOSPITAL_COMMUNITY)
Admission: RE | Admit: 2016-10-04 | Discharge: 2016-10-04 | Disposition: A | Discharge status: Home / Self Care | Payer: Medicare Other | Source: Ambulatory Visit | Attending: Surgery | Admitting: Surgery

## 2016-10-04 ENCOUNTER — Ambulatory Visit (HOSPITAL_COMMUNITY): Payer: Medicare Other | Admitting: Anesthesiology

## 2016-10-04 DIAGNOSIS — I10 Essential (primary) hypertension: Secondary | ICD-10-CM | POA: Diagnosis not present

## 2016-10-04 DIAGNOSIS — E7800 Pure hypercholesterolemia, unspecified: Secondary | ICD-10-CM | POA: Diagnosis not present

## 2016-10-04 DIAGNOSIS — K811 Chronic cholecystitis: Secondary | ICD-10-CM | POA: Diagnosis not present

## 2016-10-04 DIAGNOSIS — Z79899 Other long term (current) drug therapy: Secondary | ICD-10-CM | POA: Insufficient documentation

## 2016-10-04 DIAGNOSIS — I480 Paroxysmal atrial fibrillation: Secondary | ICD-10-CM | POA: Insufficient documentation

## 2016-10-04 DIAGNOSIS — J45909 Unspecified asthma, uncomplicated: Secondary | ICD-10-CM | POA: Diagnosis not present

## 2016-10-04 DIAGNOSIS — Z7982 Long term (current) use of aspirin: Secondary | ICD-10-CM | POA: Insufficient documentation

## 2016-10-04 DIAGNOSIS — Z86718 Personal history of other venous thrombosis and embolism: Secondary | ICD-10-CM | POA: Insufficient documentation

## 2016-10-04 DIAGNOSIS — I2510 Atherosclerotic heart disease of native coronary artery without angina pectoris: Secondary | ICD-10-CM | POA: Insufficient documentation

## 2016-10-04 HISTORY — PX: CHOLECYSTECTOMY: SHX55

## 2016-10-04 LAB — PROTIME-INR
INR: 1.02
PROTHROMBIN TIME: 13.4 s (ref 11.4–15.2)

## 2016-10-04 SURGERY — LAPAROSCOPIC CHOLECYSTECTOMY
Anesthesia: General | Site: Abdomen

## 2016-10-04 MED ORDER — SODIUM CHLORIDE 0.9 % IR SOLN
Status: DC | PRN
  Administered 2016-10-04: 1000 mL

## 2016-10-04 MED ORDER — FENTANYL CITRATE (PF) 100 MCG/2ML IJ SOLN
INTRAMUSCULAR | Status: AC
  Administered 2016-10-04: 25 ug via INTRAVENOUS
  Filled 2016-10-04: qty 2

## 2016-10-04 MED ORDER — OXYCODONE HCL 5 MG PO TABS: 5.0000 mg | ORAL_TABLET | ORAL | Status: DC | PRN

## 2016-10-04 MED ORDER — FENTANYL CITRATE (PF) 100 MCG/2ML IJ SOLN: INTRAMUSCULAR | Status: DC | PRN

## 2016-10-04 MED ORDER — MIDAZOLAM HCL 5 MG/5ML IJ SOLN
INTRAMUSCULAR | Status: DC | PRN
  Administered 2016-10-04: 2 mg via INTRAVENOUS

## 2016-10-04 MED ORDER — 0.9 % SODIUM CHLORIDE (POUR BTL) OPTIME
TOPICAL | Status: DC | PRN
  Administered 2016-10-04: 1000 mL

## 2016-10-04 MED ORDER — BUPIVACAINE-EPINEPHRINE 0.25% -1:200000 IJ SOLN
INTRAMUSCULAR | Status: DC | PRN
  Administered 2016-10-04: 6 mL

## 2016-10-04 MED ORDER — PROPOFOL 10 MG/ML IV BOLUS
INTRAVENOUS | Status: DC | PRN
  Administered 2016-10-04: 90 mg via INTRAVENOUS

## 2016-10-04 MED ORDER — CEFAZOLIN SODIUM-DEXTROSE 2-3 GM-% IV SOLR
INTRAVENOUS | Status: DC | PRN
  Administered 2016-10-04: 2 g via INTRAVENOUS

## 2016-10-04 MED ORDER — PROPOFOL 10 MG/ML IV BOLUS
INTRAVENOUS | Status: AC
  Filled 2016-10-04: qty 20

## 2016-10-04 MED ORDER — FENTANYL CITRATE (PF) 100 MCG/2ML IJ SOLN
25.0000 ug | INTRAMUSCULAR | Status: DC | PRN
  Administered 2016-10-04 (×2): 25 ug via INTRAVENOUS
  Administered 2016-10-04: 50 ug via INTRAVENOUS

## 2016-10-04 MED ORDER — LIDOCAINE HCL (CARDIAC) 20 MG/ML IV SOLN
INTRAVENOUS | Status: DC | PRN
  Administered 2016-10-04: 100 mg via INTRAVENOUS

## 2016-10-04 MED ORDER — MIDAZOLAM HCL 2 MG/2ML IJ SOLN
INTRAMUSCULAR | Status: AC
  Filled 2016-10-04: qty 2

## 2016-10-04 MED ORDER — FENTANYL CITRATE (PF) 100 MCG/2ML IJ SOLN
INTRAMUSCULAR | Status: AC
  Filled 2016-10-04: qty 2

## 2016-10-04 MED ORDER — SUGAMMADEX SODIUM 200 MG/2ML IV SOLN
INTRAVENOUS | Status: DC | PRN
  Administered 2016-10-04: 200 mg via INTRAVENOUS

## 2016-10-04 MED ORDER — IOPAMIDOL (ISOVUE-300) INJECTION 61%
INTRAVENOUS | Status: AC
  Filled 2016-10-04: qty 50

## 2016-10-04 MED ORDER — BUPIVACAINE-EPINEPHRINE (PF) 0.25% -1:200000 IJ SOLN
INTRAMUSCULAR | Status: AC
  Filled 2016-10-04: qty 30

## 2016-10-04 MED ORDER — ROCURONIUM BROMIDE 100 MG/10ML IV SOLN
INTRAVENOUS | Status: DC | PRN
Start: 2016-10-04 — End: 2016-10-04
  Administered 2016-10-04: 50 mg via INTRAVENOUS

## 2016-10-04 MED ORDER — FENTANYL CITRATE (PF) 100 MCG/2ML IJ SOLN
INTRAMUSCULAR | Status: DC | PRN
  Administered 2016-10-04 (×2): 50 ug via INTRAVENOUS

## 2016-10-04 MED ORDER — OXYCODONE-ACETAMINOPHEN 5-325 MG PO TABS: 1.0000 | ORAL_TABLET | ORAL | 0 refills | Status: DC | PRN

## 2016-10-04 MED ORDER — XARELTO 20 MG PO TABS: 20.0000 mg | ORAL_TABLET | Freq: Every day | ORAL | 0 refills | Status: DC

## 2016-10-04 MED ORDER — ONDANSETRON HCL 4 MG/2ML IJ SOLN
INTRAMUSCULAR | Status: DC | PRN
  Administered 2016-10-04: 4 mg via INTRAVENOUS

## 2016-10-04 MED ORDER — LACTATED RINGERS IV SOLN
INTRAVENOUS | Status: DC
  Administered 2016-10-04 (×2): via INTRAVENOUS

## 2016-10-04 SURGICAL SUPPLY — 38 items
APPLIER CLIP ROT 10 11.4 M/L (STAPLE) ×3
BLADE SURG ROTATE 9660 (MISCELLANEOUS) IMPLANT
CANISTER SUCTION 2500CC (MISCELLANEOUS) ×3 IMPLANT
CHLORAPREP W/TINT 26ML (MISCELLANEOUS) ×3 IMPLANT
CLIP APPLIE ROT 10 11.4 M/L (STAPLE) ×1 IMPLANT
COVER SURGICAL LIGHT HANDLE (MISCELLANEOUS) ×3 IMPLANT
DERMABOND ADVANCED (GAUZE/BANDAGES/DRESSINGS) ×2
DERMABOND ADVANCED .7 DNX12 (GAUZE/BANDAGES/DRESSINGS) ×1 IMPLANT
DRAPE WARM FLUID 44X44 (DRAPE) IMPLANT
ELECT CAUTERY BLADE 6.4 (BLADE) ×3 IMPLANT
ELECT REM PT RETURN 9FT ADLT (ELECTROSURGICAL) ×3
ELECTRODE REM PT RTRN 9FT ADLT (ELECTROSURGICAL) ×1 IMPLANT
GLOVE BIO SURGEON STRL SZ8 (GLOVE) ×3 IMPLANT
GLOVE BIOGEL PI IND STRL 8 (GLOVE) ×1 IMPLANT
GLOVE BIOGEL PI INDICATOR 8 (GLOVE) ×2
GOWN STRL REUS W/ TWL LRG LVL3 (GOWN DISPOSABLE) ×2 IMPLANT
GOWN STRL REUS W/ TWL XL LVL3 (GOWN DISPOSABLE) ×2 IMPLANT
GOWN STRL REUS W/TWL LRG LVL3 (GOWN DISPOSABLE) ×4
GOWN STRL REUS W/TWL XL LVL3 (GOWN DISPOSABLE) ×4
HEMOSTAT SNOW SURGICEL 2X4 (HEMOSTASIS) ×3 IMPLANT
KIT BASIN OR (CUSTOM PROCEDURE TRAY) ×3 IMPLANT
KIT ROOM TURNOVER OR (KITS) ×3 IMPLANT
NS IRRIG 1000ML POUR BTL (IV SOLUTION) ×3 IMPLANT
PAD ARMBOARD 7.5X6 YLW CONV (MISCELLANEOUS) ×3 IMPLANT
PENCIL BUTTON HOLSTER BLD 10FT (ELECTRODE) ×3 IMPLANT
POUCH SPECIMEN RETRIEVAL 10MM (ENDOMECHANICALS) ×3 IMPLANT
SCISSORS LAP 5X35 DISP (ENDOMECHANICALS) ×3 IMPLANT
SET IRRIG TUBING LAPAROSCOPIC (IRRIGATION / IRRIGATOR) ×3 IMPLANT
SLEEVE ENDOPATH XCEL 5M (ENDOMECHANICALS) ×3 IMPLANT
SPECIMEN JAR SMALL (MISCELLANEOUS) ×3 IMPLANT
SUT MNCRL AB 4-0 PS2 18 (SUTURE) ×3 IMPLANT
TOWEL OR 17X24 6PK STRL BLUE (TOWEL DISPOSABLE) ×3 IMPLANT
TOWEL OR 17X26 10 PK STRL BLUE (TOWEL DISPOSABLE) ×3 IMPLANT
TRAY LAPAROSCOPIC MC (CUSTOM PROCEDURE TRAY) ×3 IMPLANT
TROCAR XCEL BLUNT TIP 100MML (ENDOMECHANICALS) ×3 IMPLANT
TROCAR XCEL NON-BLD 11X100MML (ENDOMECHANICALS) ×3 IMPLANT
TROCAR XCEL NON-BLD 5MMX100MML (ENDOMECHANICALS) ×3 IMPLANT
TUBING INSUFFLATION (TUBING) ×3 IMPLANT

## 2016-10-04 NOTE — Interval H&P Note (Signed)
History and Physical Interval Note:  10/04/2016 11:34 AM  Latoya [name]  has presented today for surgery, with the diagnosis of CHRONIC CHOLECYSTITIS  The various methods of treatment have been discussed with the patient and family. After consideration of risks, benefits and other options for treatment, the patient has consented to  Procedure(s): LAPAROSCOPIC CHOLECYSTECTOMY (N/A) as a surgical intervention .  The patient's history has been reviewed, patient examined, no change in status, stable for surgery.  I have reviewed the patient's chart and labs.  Questions were answered to the patient's satisfaction.     [name] A.

## 2016-10-04 NOTE — Anesthesia Preprocedure Evaluation (Addendum)
Anesthesia Evaluation  Patient identified by MRN, date of birth, ID band Patient awake    Reviewed: Allergy & Precautions, H&P , Patient's Chart, lab work & pertinent test results  Airway Mallampati: II  TM Distance: >3 FB Neck ROM: Limited    Dental no notable dental hx.    Pulmonary    Pulmonary exam normal breath sounds clear to auscultation       Cardiovascular Exercise Tolerance: Good hypertension,  Rhythm:regular Rate:Normal     Neuro/Psych    GI/Hepatic   Endo/Other    Renal/GU      Musculoskeletal   Abdominal   Peds  Hematology   Anesthesia Other Findings Asthma    (paroxysmal atrial fibrillation)  CAD Coronary heart disease     GERD (gastroesophageal reflux disease)   Complication of anesthesia  cardiac arrest- in OR, at age 70 (27)y.o. during Scalenotomy in her early 20's Family history of adverse reaction to anesthesia  some family members have had trouble HTN   03/2014 Heart murmur         Reproductive/Obstetrics                           Anesthesia Physical Anesthesia Plan  ASA: III  Anesthesia Plan: General   Post-op Pain Management:    Induction: Intravenous  Airway Management Planned: Oral ETT and Video Laryngoscope Planned  Additional Equipment:   Intra-op Plan:   Post-operative Plan: Extubation in OR  Informed Consent: I have reviewed the patients History and Physical, chart, labs and discussed the procedure including the risks, benefits and alternatives for the proposed anesthesia with the patient or authorized representative who has indicated his/her understanding and acceptance.   Dental Advisory Given and Dental advisory given  Plan Discussed with: CRNA and Surgeon  Anesthesia Plan Comments: (  Discussed general anesthesia, including possible nausea, instrumentation of airway, sore throat,pulmonary aspiration, etc. I asked if the were any  outstanding questions, or  concerns before we proceeded. )        Anesthesia Quick Evaluation

## 2016-10-04 NOTE — Op Note (Signed)
Laparoscopic Cholecystectomy  Procedure Note  Indications: This patient presents with symptomatic gallbladder disease and will undergo laparoscopic cholecystectomy. Pt had a percutaneous cholecystostomy tube placed in August 2017 for ascending cholangitis and sepsis.  She has recovered.  Opted for drain removal vs cholecystectomy. She opted for cholecystectomy.  The procedure has been discussed with the patient. Operative and non operative treatments have been discussed. Risks of surgery include bleeding, infection,  Common bile duct injury,  Injury to the stomach,liver, colon,small intestine, abdominal wall,  Diaphragm,  Major blood vessels,  And the need for an open procedure.  Other risks include worsening of medical problems, death,  DVT and pulmonary embolism, and cardiovascular events.   Medical options have also been discussed. The patient has been informed of long term expectations of surgery and non surgical options,  The patient agrees to proceed.    Pre-operative Diagnosis: Calculus of gallbladder without mention of cholecystitis or obstruction  Post-operative Diagnosis: Same  Surgeon: Sakari Alkhatib A.   Assistants: Deon Pilling RNFA   Anesthesia: General endotracheal anesthesia and Local anesthesia 0.25.% bupivacaine, with epinephrine  ASA Class: 3  Procedure Details  The patient was seen again in the Holding Room. The risks, benefits, complications, treatment options, and expected outcomes were discussed with the patient. The possibilities of reaction to medication, pulmonary aspiration, perforation of viscus, bleeding, recurrent infection, finding a normal gallbladder, the need for additional procedures, failure to diagnose a condition, the possible need to convert to an open procedure, and creating a complication requiring transfusion or operation were discussed with the patient. The patient and/or family concurred with the proposed plan, giving informed consent. The site of surgery  properly noted/marked. The patient was taken to Operating Room, identified as Latoya [name] [facility] and the procedure verified as Laparoscopic Cholecystectomy with Intraoperative Cholangiograms. A Time Out was held and the above information confirmed.  Prior to the induction of general anesthesia, antibiotic prophylaxis was administered. General endotracheal anesthesia was then administered and tolerated well. After the induction, the abdomen was prepped in the usual sterile fashion.The percutaneous drain was removed.  The patient was positioned in the supine position with the left arm comfortably tucked, along with some reverse Trendelenburg.  Local anesthetic agent was injected into the skin near the umbilicus and an incision made. The midline fascia was incised and the Hasson technique was used to introduce a 12 mm port under direct vision. There was some bleeding from the left rectus muscle controlled with cautery and clips.   It was secured with a figure of eight Vicryl suture placed in the usual fashion. Pneumoperitoneum was then created with CO2 and tolerated well without any adverse changes in the patient's vital signs. Additional trocars were introduced under direct vision with an 11 mm trocar in the epigastrium and 2 5 mm trocars in the right upper quadrant. All skin incisions were infiltrated with a local anesthetic agent before making the incision and placing the trocars.   The gallbladder was identified, the fundus grasped and retracted cephalad. Adhesions were lysed bluntly and with the electrocautery where indicated, taking care not to injure any adjacent organs or viscus. The infundibulum was grasped and retracted laterally, exposing the peritoneum overlying the triangle of Calot. This was then divided and exposed in a blunt fashion. The cystic duct was clearly identified and bluntly dissected circumferentially. The junctions of the gallbladder, cystic duct and common bile duct were clearly identified  prior to the division of any linear structure.   A cholangiogram was not done  since her preop drain study showed no stones and a patent  CBD.   The cystic duct was then  ligated with surgical clips  on the patient side and  clipped on the gallbladder side and divided. The cystic artery was identified, dissected free, ligated with clips and divided as well. Posterior cystic artery clipped and divided.  The gallbladder was dissected from the liver bed in retrograde fashion with the electrocautery. The gallbladder was removed. The liver bed was irrigated and inspected. Hemostasis was achieved with the electrocautery. Copious irrigation was utilized and was repeatedly aspirated until clear all particulate matter. Hemostasis was achieved with no signs of bleeding or bile leakage.  Pneumoperitoneum was completely reduced after viewing removal of the trocars under direct vision. The wound was thoroughly irrigated and the fascia was then closed with a figure of eight suture; the skin was then closed with 4 O monocryl  and a sterile dressing was applied.  Instrument, sponge, and needle counts were correct at closure and at the conclusion of the case.   Findings:   Cholelithiasis  Estimated Blood Loss: less than 100 mL         Drains: none         Total IV Fluids:800 mL         Specimens: Gallbladder           Complications: None; patient tolerated the procedure well.         Disposition: PACU - hemodynamically stable.         Condition: stable

## 2016-10-04 NOTE — Discharge Instructions (Signed)
[facility] ______[facility] °LAPAROSCOPIC SURGERY: POST OP INSTRUCTIONS °Always review your discharge instruction sheet given to you by the facility where your surgery was performed. °IF YOU HAVE DISABILITY OR FAMILY LEAVE FORMS, YOU MUST BRING THEM TO THE OFFICE FOR PROCESSING.   °DO NOT GIVE THEM TO YOUR DOCTOR. ° °1. A prescription for pain medication may be given to you upon discharge.  Take your pain medication as prescribed, if needed.  If narcotic pain medicine is not needed, then you may take acetaminophen (Tylenol) or ibuprofen (Advil) as needed. °2. Take your usually prescribed medications unless otherwise directed. °3. If you need a refill on your pain medication, please contact your pharmacy.  They will contact our office to request authorization. Prescriptions will not be filled after 5pm or on week-ends. °4. You should follow a light diet the first few days after arrival home, such as soup and crackers, etc.  Be sure to include lots of fluids daily. °5. Most patients will experience some swelling and bruising in the area of the incisions.  Ice packs will help.  Swelling and bruising can take several days to resolve.  °6. It is common to experience some constipation if taking pain medication after surgery.  Increasing fluid intake and taking a stool softener (such as Colace) will usually help or prevent this problem from occurring.  A mild laxative (Milk of Magnesia or Miralax) should be taken according to package instructions if there are no bowel movements after 48 hours. °7. Unless discharge instructions indicate otherwise, you may remove your bandages 24-48 hours after surgery, and you may shower at that time.  You may have steri-strips (small skin tapes) in place directly over the incision.  These strips should be left on the skin for 7-10 days.  If your surgeon used skin glue on the incision, you may shower in 24 hours.  The glue will flake off over the next 2-3 weeks.  Any sutures or  staples will be removed at the office during your follow-up visit. °8. ACTIVITIES:  You may resume regular (light) daily activities beginning the next day--such as daily self-care, walking, climbing stairs--gradually increasing activities as tolerated.  You may have sexual intercourse when it is comfortable.  Refrain from any heavy lifting or straining until approved by your doctor. °a. You may drive when you are no longer taking prescription pain medication, you can comfortably wear a seatbelt, and you can safely maneuver your car and apply brakes. °b. RETURN TO WORK:  __________________________________________________________ °9. You should see your doctor in the office for a follow-up appointment approximately 2-3 weeks after your surgery.  Make sure that you call for this appointment within a day or two after you arrive home to insure a convenient appointment time. °10. OTHER INSTRUCTIONS: __________________________________________________________________________________________________________________________ __________________________________________________________________________________________________________________________ °WHEN TO CALL YOUR DOCTOR: °1. Fever over 101.0 °2. Inability to urinate °3. Continued bleeding from incision. °4. Increased pain, redness, or drainage from the incision. °5. Increasing abdominal pain ° °The clinic staff is available to answer your questions during regular business hours.  Please don’t hesitate to call and ask to speak to one of the nurses for clinical concerns.  If you have a medical emergency, go to the nearest emergency room or call 911.  A surgeon from [facility] is always on call at the hospital. °[address], [address], [place], [place]  [address] ? [address] [address], [place], [place]   [address] °[phone] ? [phone] ? [phone] [phone] °Web site:   [email or web address] °

## 2016-10-04 NOTE — Anesthesia Procedure Notes (Signed)
Procedure Name: Intubation Date/Time: 10/04/2016 1:00 PM Performed by: Manuela Schwartz B Pre-anesthesia Checklist: Patient identified, Emergency Drugs available, Suction available, Patient being monitored and Timeout performed Patient Re-evaluated:Patient Re-evaluated prior to inductionOxygen Delivery Method: Circle system utilized Preoxygenation: Pre-oxygenation with 100% oxygen Intubation Type: IV induction Ventilation: Mask ventilation without difficulty Grade View: Grade I Tube type: Oral Tube size: 7.0 mm Number of attempts: 1 Airway Equipment and Method: Stylet and Video-laryngoscopy (elective glidescope ) Placement Confirmation: ETT inserted through vocal cords under direct vision,  positive ETCO2 and breath sounds checked- equal and bilateral Secured at: 21 cm Tube secured with: Tape Dental Injury: Teeth and Oropharynx as per pre-operative assessment

## 2016-10-04 NOTE — H&P (View-Only) (Signed)
Latoya [name] [name] 09/07/2016 11:17 AM Location: [name] Patient #: [id number] DOB: June 13, 1946 Married / Language: English / Race: White Female  History of Present Illness Latoya [name] [name] A. Latoya [name] [name] Damron MD; 09/07/2016 12:26 PM) Patient words: Patient returns for follow-up after cholecystostomy tube placement in August. She is recovering nicely and is much less debilitated. She is in good spirits and is eager to start back swelling. Denies any significant abdominal pain. Drain site is clean and is not giving her too much trouble.  The patient is a 70 year old female.   Other Problems Latoya [name] [name], [place]; 09/07/2016 11:18 AM) Arthritis Asthma Atrial Fibrillation Gastroesophageal Reflux Disease General anesthesia - complications Hypercholesterolemia Pulmonary Embolism / Blood Clot in Legs Transfusion history  Past Surgical History Latoya [name] [name], CMA; 09/07/2016 11:18 AM) Breast Mass; Local Excision Bilateral. Knee [name] Left. Latoya [name] [name] Spinal [name] - Neck Spinal [name] Midback Tonsillectomy  Diagnostic Studies History Latoya [name] [name], [place]; 09/07/2016 11:18 AM) Colonoscopy 1-5 years ago Mammogram within last year Pap Smear >5 years ago  Allergies Latoya [name] [name], CMA; 09/07/2016 11:18 AM) Betadine *ANTISEPTICS & DISINFECTANTS* Chlorhexidine Diacetate *CHEMICALS* Heparin Sodium *CHEMICALS* Indomethacin *ANALGESICS - ANTI-INFLAMMATORY* Lovenox *ANTICOAGULANTS* Phenylpropanolamine-Aspirin *COUGH/COLD/ALLERGY* LACTOSE PORK BEE VENOM  Medication History Latoya [name] [name], CMA; 09/07/2016 11:19 AM) EPINEPHrine HCl (1MG /ML Solution, Injection AS needed) Active. Nystatin (500000 UNIT Tablet, Latoya [name] Latoya [name] [name]) Active. Qvar (40MCG/ACT Aerosol Soln, Inhalation) Active. Calcium (500MG  Tablet, Latoya [name]) Active. Stool Softener (100MG  Tablet, Latoya [name]) Active. Azelastine HCl (0.1% Solution, Nasal) Active. Alavert (10MG  Tablet, Latoya [name] TWo times  [name]) Active. Lidocaine (5% Patch, External) Active. Nasal (0.65% Solution, Nasal) Active. Metoprolol Tartrate (50MG  Tablet, Latoya [name]) Active. Esomeprazole Magnesium (40MG  Capsule DR, Latoya [name]) Active. Glucosamine Chondr 500 Complex (Latoya [name] TWo times [name]) Active. Nasacort Allergy 24HR (55MCG/ACT Aerosol, Nasal) Active. Ferrex 150 (150MG  Capsule, Latoya [name]) Active. Synthroid (75MCG Tablet, Latoya [name]) Active. FentaNYL (37.5MCG/HR Patch 72HR, Transdermal) Active. Montelukast Sodium (10MG  Tablet, Latoya [name]) Active. Melatonin (5MG  Capsule, Latoya [name]) Active. Duragesic-12 (12MCG/HR Patch 72HR, Transdermal) Active. Aspirin (81MG  Tablet, Latoya [name]) Active. Atorvastatin Calcium (40MG  Tablet, Latoya [name]) Active. Multiple Vitamin (Latoya [name]) Active. Sorbitol (70% Solution,) Active. Potassium Chloride (20MEQ Tablet ER, Latoya [name]) Active. Lasix (20MG  Tablet, Latoya [name]) Active. Aldactone (25MG  Tablet, Latoya [name]) Active. Medications Reconciled  Social History Latoya [name] [name], [place]; 09/07/2016 11:18 AM) Caffeine use Tea. No alcohol use No drug use Tobacco use Never smoker.  Family History Latoya [name] [name], [place]; 09/07/2016 11:18 AM) Alcohol Abuse Brother. Arthritis Brother, Father. Colon Cancer Sister. Heart Disease Father. Heart disease in female family member before age 70 Heart disease in female family member before age 29 Hypertension Father. Thyroid problems Mother.  Pregnancy / Birth History Latoya [name] [name], [place]; 09/07/2016 11:18 AM) Age at menarche 40 years. Contraceptive History Latoya [name] contraceptives. Gravida 2 Maternal age 47-30 Para 0 Regular periods     Review of Systems Latoya [name] [name] CMA; 09/07/2016 11:18 AM) General Present- Appetite Loss and Chills. Not Present- Fatigue, Fever, Night Sweats, Weight Gain and Weight Loss. Skin Present- Dryness. Not Present- Change in Wart/Mole, Hives, Jaundice, New Lesions, Non-Healing Wounds, Rash and Ulcer. HEENT Present- Hoarseness, Seasonal Allergies, Sinus  Pain, Sore Throat and Wears glasses/contact lenses. Not Present- Earache, Hearing Loss, Nose Bleed, Latoya [name] Ulcers, Ringing in the Ears, Visual Disturbances and Yellow Eyes. Respiratory Not Present- Bloody sputum, Chronic Cough, Difficulty Breathing, Snoring and Wheezing. Breast Not Present- Breast Mass, Breast Pain, Nipple Discharge and Skin Changes. Cardiovascular Present- Palpitations. Not Present- Chest Pain, Difficulty Breathing Lying Down, Leg Cramps, Rapid Heart Rate, Shortness of Breath and Swelling of Extremities. Gastrointestinal Present- Abdominal Pain, Difficulty  Swallowing, Excessive gas and Vomiting. Not Present- Bloating, Bloody Stool, Change in Bowel Habits, Chronic diarrhea, Constipation, Gets full quickly at meals, Hemorrhoids, Indigestion, Nausea and Rectal Pain. Female Genitourinary Not Present- Frequency, Nocturia, Painful Urination, Pelvic Pain and Urgency. Musculoskeletal Present- Back Pain, Joint Pain and Joint Stiffness. Not Present- Muscle Pain, Muscle Weakness and Swelling of Extremities. Neurological Present- Weakness. Not Present- Decreased Memory, Fainting, Headaches, Numbness, Seizures, Tingling, Tremor and Trouble walking. Psychiatric Not Present- Anxiety, Bipolar, Change in Sleep Pattern, Depression, Fearful and Frequent crying. Endocrine Present- Hair Changes. Not Present- Cold Intolerance, Excessive Hunger, Heat Intolerance, Hot flashes and New Diabetes. Hematology Present- Blood Thinners and Easy Bruising. Not Present- Excessive bleeding, Gland problems, HIV and Persistent Infections.  Vitals Latoya [name] [name] Latoya [name] [name] CMA; 09/07/2016 11:20 AM) 09/07/2016 11:19 AM Weight: 162.4 lb Height: 62in Body Surface Area: 1.75 m Body Mass Index: 29.7 kg/m  Temp.: 98.73F  Pulse: 88 (Regular)  BP: 126/78 (Sitting, Left Arm, Standard)      Physical Exam (Latoya [name] [name] A. Akyra Bouchie MD; 09/07/2016 12:26 PM)  General Mental Status-Alert. General Appearance-Consistent with  stated age. Hydration-Well hydrated. Voice-Normal.  Head and Neck Head-normocephalic, atraumatic with no lesions or palpable masses.  Chest and Lung Exam Chest and lung exam reveals -quiet, even and easy respiratory effort with no use of accessory muscles and on auscultation, normal breath sounds, no adventitious sounds and normal vocal resonance. Inspection Chest Wall - Normal. Back - normal.  Cardiovascular Cardiovascular examination reveals -on palpation PMI is normal in location and amplitude, no palpable S3 or S4. Normal cardiac borders., normal heart sounds, regular rate and rhythm with no murmurs, carotid auscultation reveals no bruits and normal pedal pulses bilaterally.  Abdomen Note: Drains are intact. No significant signs of infection. Abdomen soft nontender.  Musculoskeletal Normal Exam - Left-Upper Extremity Strength Normal and Lower Extremity Strength Normal. Normal Exam - Right-Upper Extremity Strength Normal, Lower Extremity Weakness.    Assessment & Plan (Allyanna Appleman A. Shahara Hartsfield MD; 09/07/2016 12:27 PM)  CHOLECYSTITIS, CHRONIC (K81.1) Impression: Given her ultrasound findings of sludge and discussed with the tube versus cholecystectomy. She is a much better shape and desires cholecystectomy at this point in time. Risks and benefits of [name] discussed with her and her husband today. The procedure has been discussed with the patient. Risks of laparoscopic cholecystectomy include bleeding, infection, bile duct injury, leak, death, open [name], diarrhea, other [name], organ injury, blood vessel injury, DVT, and additional care.  Current Plans You are being scheduled for [name] - Our schedulers will call you.  You should hear from our office's scheduling department within 5 working days about the location, date, and time of [name]. We try to make accommodations for patient's preferences in scheduling [name], but sometimes the OR schedule or the surgeon's  schedule prevents [place] from making those accommodations.  If you have not heard from our office [phone]) in 5 working days, call the office and ask for your surgeon's nurse.  If you have other questions about your diagnosis, plan, or [name], call the office and ask for your surgeon's nurse.  Pt Education - Pamphlet Given - Laparoscopic Gallbladder [name]: discussed with patient and provided information. The anatomy & physiology of hepatobiliary & pancreatic function was discussed. The pathophysiology of gallbladder dysfunction was discussed. Natural history risks without [name] was discussed. I feel the risks of no intervention will lead to serious problems that outweigh the operative risks; therefore, I recommended cholecystectomy to remove the pathology. I explained laparoscopic techniques with possible need for an open approach.  Probable cholangiogram to evaluate the bilary tract was explained as well.  Risks such as bleeding, infection, abscess, leak, injury to other organs, need for further treatment, heart attack, death, and other risks were discussed. I noted a good likelihood this will help address the problem. Possibility that this will not correct all abdominal symptoms was explained. Goals of post-operative recovery were discussed as well. We will work to minimize complications. An educational handout further explaining the pathology and treatment options was given as well. Questions were answered. The patient expresses understanding & wishes to proceed with [name].  Pt Education - [facility] Laparosopic Post Op HCI (Gross) Pt Education - Laparoscopic Cholecystectomy: gallbladder

## 2016-10-04 NOTE — Anesthesia Preprocedure Evaluation (Signed)
Anesthesia Evaluation  Patient identified by MRN, date of birth, ID band Patient awake    Reviewed: Allergy & Precautions, H&P , Patient's Chart, lab work & pertinent test results, reviewed documented beta blocker date and time   Airway Mallampati: II  TM Distance: >3 FB Neck ROM: full    Dental no notable dental hx.    Pulmonary    Pulmonary exam normal breath sounds clear to auscultation       Cardiovascular hypertension,  Rhythm:regular Rate:Normal     Neuro/Psych    GI/Hepatic   Endo/Other    Renal/GU      Musculoskeletal   Abdominal   Peds  Hematology   Anesthesia Other Findings Asthma    Pulmonary embolism DVT (deep venous thrombosis) (HCC)    PAF (paroxysmal atrial fibrillation) (HCC)  not on coumadin therapy HLD (hyperlipidemia)    CAD  Anemia,  50% EF echo GERD  Complication of anesthesia  cardiac arrest- in OR, at age 70 (70)y.o. during Scalenotomy in her early 50's Family history of adverse reaction to anesthesia  some family members have had trouble waking up Blood dyscrasia  HTN (hypertension)      Reproductive/Obstetrics                             Anesthesia Physical Anesthesia Plan  ASA: II  Anesthesia Plan: General   Post-op Pain Management:    Induction: Intravenous  Airway Management Planned: Oral ETT  Additional Equipment:   Intra-op Plan:   Post-operative Plan: Extubation in OR  Informed Consent: I have reviewed the patients History and Physical, chart, labs and discussed the procedure including the risks, benefits and alternatives for the proposed anesthesia with the patient or authorized representative who has indicated his/her understanding and acceptance.   Dental Advisory Given and Dental advisory given  Plan Discussed with: CRNA and Surgeon  Anesthesia Plan Comments: (  Discussed general anesthesia, including possible nausea,  instrumentation of airway, sore throat,pulmonary aspiration, etc. I asked if the were any outstanding questions, or  concerns before we proceeded. )        Anesthesia Quick Evaluation

## 2016-10-04 NOTE — Transfer of Care (Signed)
Immediate Anesthesia Transfer of Care Note  Patient: Latoya [name]  Procedure(s) Performed: Procedure(s): LAPAROSCOPIC CHOLECYSTECTOMY (N/A)  Patient Location: PACU  Anesthesia Type:General  Level of Consciousness: awake, alert  and oriented  Airway & Oxygen Therapy: Patient Spontanous Breathing and Patient connected to nasal cannula oxygen  Post-op Assessment: Report given to RN and Post -op Vital signs reviewed and stable  Post vital signs: Reviewed and stable  Last Vitals:  Vitals:   10/04/16 1417 10/04/16 1418  BP:  (!) 151/71  Pulse:  72  Resp:  12  Temp: 36.7 C     Last Pain:  Vitals:   10/04/16 1417  TempSrc:   PainSc: 0-No pain         Complications: No apparent anesthesia complications

## 2016-10-04 NOTE — Anesthesia Postprocedure Evaluation (Signed)
Anesthesia Post Note  Patient: Latoya [name]  Procedure(s) Performed: Procedure(s) (LRB): LAPAROSCOPIC CHOLECYSTECTOMY (N/A)  Patient location during evaluation: PACU Anesthesia Type: General Level of consciousness: awake and alert Pain management: pain level controlled Vital Signs Assessment: post-procedure vital signs reviewed and stable Respiratory status: spontaneous breathing, nonlabored ventilation, respiratory function stable and patient connected to nasal cannula oxygen Cardiovascular status: blood pressure returned to baseline and stable Postop Assessment: no signs of nausea or vomiting Anesthetic complications: no Comments: Comfortable; no complaints    Last Vitals:  Vitals:   10/04/16 1433 10/04/16 1448  BP: (!) 171/74 (!) 179/76  Pulse: 74 72  Resp: 12 14  Temp:      Last Pain:  Vitals:   10/04/16 1417  TempSrc:   PainSc: 0-No pain                 Dorrance Sellick EDWARD

## 2016-10-05 ENCOUNTER — Encounter (HOSPITAL_COMMUNITY): Payer: Self-pay | Admitting: Surgery

## 2016-10-10 ENCOUNTER — Ambulatory Visit: Payer: Medicare Other | Admitting: Family Medicine

## 2016-10-14 ENCOUNTER — Encounter: Payer: Self-pay | Admitting: Surgery

## 2016-10-14 ENCOUNTER — Telehealth: Payer: Self-pay | Admitting: Surgery

## 2016-10-14 NOTE — Telephone Encounter (Signed)
Ms. Heyen had a lap chole (after perc drain) by Dr. Brantley Stage on 10/04/2016.  She has some redness and drainage from her umbilicus.  I told her to treat this with warm soaks and dress the wound.  She will check with our office on 10/16/2016 if she is not better.   Alphonsa Overall, MD, [facility] Pager: [phone] Office phone:  [phone]

## 2016-10-24 ENCOUNTER — Ambulatory Visit: Payer: Medicare Other | Admitting: Family Medicine

## 2016-10-25 ENCOUNTER — Encounter: Payer: Self-pay | Admitting: General Surgery

## 2016-11-05 DIAGNOSIS — M4722 Other spondylosis with radiculopathy, cervical region: Secondary | ICD-10-CM | POA: Diagnosis not present

## 2016-11-05 DIAGNOSIS — G894 Chronic pain syndrome: Secondary | ICD-10-CM | POA: Diagnosis not present

## 2016-11-05 DIAGNOSIS — M963 Postlaminectomy kyphosis: Secondary | ICD-10-CM | POA: Diagnosis not present

## 2016-11-05 DIAGNOSIS — Z79891 Long term (current) use of opiate analgesic: Secondary | ICD-10-CM | POA: Diagnosis not present

## 2016-11-21 ENCOUNTER — Ambulatory Visit (INDEPENDENT_AMBULATORY_CARE_PROVIDER_SITE_OTHER): Payer: Medicare Other | Admitting: Family Medicine

## 2016-11-21 ENCOUNTER — Encounter: Payer: Self-pay | Admitting: Family Medicine

## 2016-11-21 VITALS — BP 106/78 | HR 78 | Temp 97.8°F | Ht 62.0 in | Wt 166.0 lb

## 2016-11-21 DIAGNOSIS — E039 Hypothyroidism, unspecified: Secondary | ICD-10-CM | POA: Diagnosis not present

## 2016-11-21 DIAGNOSIS — Z1159 Encounter for screening for other viral diseases: Secondary | ICD-10-CM

## 2016-11-21 DIAGNOSIS — D649 Anemia, unspecified: Secondary | ICD-10-CM

## 2016-11-21 DIAGNOSIS — B372 Candidiasis of skin and nail: Secondary | ICD-10-CM

## 2016-11-21 MED ORDER — NYSTATIN 100000 UNIT/GM EX POWD: Freq: Four times a day (QID) | CUTANEOUS | 2 refills | Status: DC

## 2016-11-21 MED ORDER — LEVOTHYROXINE SODIUM 75 MCG PO TABS: 75.0000 ug | ORAL_TABLET | Freq: Every day | ORAL | 3 refills | Status: DC

## 2016-11-21 NOTE — Progress Notes (Signed)
Pre visit review using our clinic review tool, if applicable. No additional management support is needed unless otherwise documented below in the visit note. 

## 2016-11-21 NOTE — Patient Instructions (Addendum)
Use the nystatin as needed for your rash We will check on your labs today- will look at your anemia level, and I will be in touch with your labs  I will also check your thyroid today and will let you know if we need to adjust your dose of medication I agree that staying in more until the flu is over may be a good idea

## 2016-11-21 NOTE — Progress Notes (Addendum)
[place] [facility] [address] [place], [place] [address] [id number] [phone]  Date:  11/21/2016   Name:  Latoya [name]   DOB:  02-Apr-1946   MRN:  [id number]  PCP:  Lamar Blinks, MD    Chief Complaint: Establish Care (Pt here to est care. Pt had gallbladder surgery Dec. 14th and states that she does not have her strength back yet.  )   History of Present Illness:  Latoya [name] is a 71 y.o. very pleasant female patient who presents with the following:  See last OV from the fall-  See partial HPI from that visit to summarize her recent history:  I have seen this pt at [facility], last about 2 years ago. She is here today to follow-up from recent hospital stays; she was in the hospital in August from 8/14 to 8/31 for ascending cholangitis.  She was very ill and developed severe sepsis, has a per-cutaneous cholecystomy drain in place. She was then in rehab for most of September to regain strength.  She went back to the hospital for 3 days about 10 days ago with illness- at first thought to be pneumonia but then the reason for her sx was less clear.  They are still not quite sure why she got sick with apparent infection again, but in any case she again improving and gaining strength She finished her levaquin last week. She is feeling "fine," she is no longer vomiting.  No fever or chills since she got home.  She is eating "very carefully," a low fat diet to take it easy on her gallbladder.   She is now using her scooter to get around- she has had this for a long time (10 years) due to her back problems.  She does not always use this and I have not seen her in this in the past.    Dr. Vertell Limber is her NSG  She sees Dr. Brantley Stage this week.   They think that they will be able to remove her drain and then have her gallbladder out in the near future.    She is s/p hysterectomy.  She has anti- phospholipid ab syndrome.   This is why she was not able to  have kids- she had recurrent MC.  She has participated in a study of this condition which she hopes will help other women with the same problem.   She has intermittent a fib and is able to feel when she is in fib. She is not currently in a fib She is on amiodarone, lopressor, xarelto   She had her gallbladder out on 12/14- she feels that she is recovering well from this operation and getting back to her normal state of health.  She would like to increase her activity level and wonders if she is able to start going to church, the gym, etc.  However there is a large flu outbreak right now and her surgeon has encouraged her to stay home.  I agreed that although generally social interaction and exercise are great, it would be best to wait until the flu recedes from current levels  She is not having much in the way of pain at this time- however she does find that she hs a hard time if she eats any fatty foods.  She is avoiding fatty foods for now She is maintaining her weight  Noted that she has been anemic as of late- her hg was 13-14  in 2016; then dropped over the last several months. Improved to 11.6 last month.   She did a capsule endoscopy but is not sure of the result- it seems like they were not able to recover the capsule to get her results She needs a HD placard today and brings her form  Wt Readings from Last 3 Encounters:  11/21/16 166 lb (75.3 kg)  10/04/16 164 lb 9 oz (74.6 kg)  09/26/16 164 lb 9 oz (74.6 kg)   Flu shot is UTD A few days ago she had a red and swollen area on the right side of her head- it is now resolved. 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 Pre-diabetes 09/25/2016  .  Healthcare-associated pneumonia 08/04/2016  . Leukocytosis 08/04/2016  . Nausea with vomiting 08/04/2016  . Hypothyroidism 08/04/2016  . Anemia of chronic disease 08/04/2016  . Cholangitis   . Gastroparesis   . Acute respiratory failure ([place])   . Enterococcal bacteremia 06/06/2016  . Bacteremia due to Klebsiella pneumoniae 06/06/2016  . Bacteremia due to Escherichia coli 06/06/2016  . Acute respiratory failure with hypoxia ([place])   . Ascending cholangitis 06/04/2016  . Hemangioma of liver 06/04/2016  . Cervical pseudoarthrosis ([place]) 08/31/2014  . Anaphylaxis, mild, due to wasp envenomation 04/12/2013  . Chest pain, atypical 04/11/2013  . PAD (peripheral artery disease) ([place]) 06/20/2011  . ANEMIA, IRON DEFICIENCY, MICROCYTIC 06/01/2010  . Primary hypercoagulable state ([place]) 06/01/2010  . BACK PAIN, CHRONIC 06/01/2010  . ABDOMINAL PAIN -GENERALIZED 06/01/2010  . CAROTID ARTERY DISEASE 05/25/2010  . CAD, NATIVE VESSEL 11/15/2009  . MURMUR 11/15/2009  . CAROTID BRUIT, LEFT 11/15/2009  . Essential hypertension 03/18/2009  . History of pulmonary embolism 03/18/2009  . PAROXYSMAL ATRIAL FIBRILLATION 03/18/2009  . DVT 03/18/2009  . Asthma 03/18/2009  . GERD 03/18/2009  . SPONDYLOSIS, LUMBAR 03/18/2009  . HYPERLIPIDEMIA 11/29/2008  . CORONARY HEART DISEASE 11/29/2008    Past Medical History:  Diagnosis Date  . Anemia, iron deficiency   . Antiphospholipid syndrome (HCC)    with hypercoagulable state  . Asthma    extrinsic; moderate, persistant, nml spirometry 2010, nml CXR 1/08  . Bladder troubles    REPORTS INFECTIONS ON OCCASION DUE TO URETHRA MEATUS STRICTURE AT BIRTH   . Blood dyscrasia    antiphospholipid disorder  . CAD (coronary artery disease)    50% mid LAD, 80% ostial D1 and moderate 80% mid circ by vath 2003  . Cancer ([place])    basal cell removed fr. L arm   . Complication of anesthesia    cardiac arrest- in OR, at age 17 (64)y.o. during Scalenotomy in her early  20's  . Coronary heart disease   . Drug allergy    heparin/lovenox  . DVT (deep venous thrombosis) ([place])   . Erosive gastritis 1994  . Family history of adverse reaction to anesthesia    some family members have had trouble waking up  . GERD (gastroesophageal reflux disease)   . H/O hiatal hernia   . Heart murmur   . HLD (hyperlipidemia)   . HTN (hypertension)    Pt states her high blood pressure readings are  due tot he method of measurementsMcAl[facility]aney at [employer], manages pt.  LOV 03/2014  . Hypothyroidism   . Liver spot    cyst- - no biopsy, but told that its benign   . PAF (paroxysmal atrial fibrillation) (HCC)    not on coumadin therapy  . Pulmonary embolism ([place])    related to back surgery with prior coumadin use, now off  . Spondylosis, lumbosacral    ARTHRITIS- OA     Past Surgical History:  Procedure Laterality Date  . ABDOMINAL HYSTERECTOMY     partial abdominal -1998  . BACK SURGERY     x13 cervical and lumbar thoracic spine surgery  . BREAST ENHANCEMENT SURGERY    . BREAST SURGERY     all benign cysts x3   . CARDIAC CATHETERIZATION     2005  . CHOLECYSTECTOMY N/A 10/04/2016   Procedure: LAPAROSCOPIC CHOLECYSTECTOMY;  Surgeon: Erroll Luna, MD;  Location: [facility] OR;  Service: General;  Laterality: N/A;  . cleft lip and palate repair     71 yo   . hysterectomy    . IR GENERIC HISTORICAL  06/04/2016   IR PERC CHOLECYSTOSTOMY 06/04/2016 Greggory Keen, MD [facility]-INTER[facility] RAD  . IR GENERIC HISTORICAL  08/06/2016   IR CHOLANGIOGRAM EXISTING TUBE 08/06/2016 Aletta Edouard, MD [facility]  . IR GENERIC HISTORICAL  07/17/2016   IR RADIOLOGIST EVAL & MGMT 07/17/2016 [facility] INTER[facility] RAD  . JOINT REPLACEMENT    . POSTERIOR CERVICAL FUSION/FORAMINOTOMY Right 08/31/2014   Procedure: Right cervical six-seven foraminotomy, Cervical five-Thoracic one fusion, Cervical six-seven lateral mass screws;  Surgeon: Erline Levine, MD;  Location: [facility];  Service: Neurosurgery;  Laterality:  Right;  . spina bifida repair     71 yo   . TONSILLECTOMY    . TOTAL KNEE ARTHROPLASTY     left    Social History  Substance Use Topics  . Smoking status: Never Smoker  . Smokeless tobacco: Never Used     Comment: no smoking   . Alcohol use No    Family History  Problem Relation Age of Onset  . Heart disease Father     MI   . Heart disease Sister     Allergies  Allergen Reactions  . Chlorhexidine Anaphylaxis and Itching    Sensitive to dye in Betadine & Chlorohexadine   . Enoxaparin Sodium Anaphylaxis  . Heparin Anaphylaxis  . Hornet Venom Anaphylaxis    European Hornets  . Other Anaphylaxis and Itching    Sensitive to dye in Betadine & Chlorohexadine   . Pork-Derived Products Anaphylaxis  . Povidone-Iodine Itching and Rash    NOTE: Patient has had anaphylactic reaction to Betadine due to dyes in product. Sensitivity-" but if its wiped off she is able to tolerate betadine"   . Indomethacin Rash  . Lactose Intolerance (Gi) Diarrhea and Nausea And Vomiting    Can tolerate most [place] cheeses (Takes Lactaid)  . Phenylpropanolamine Hypertension    Medication list has been reviewed and updated.  Current Outpatient Prescriptions on File Prior to Visit  Medication Sig Dispense Refill  . albuterol (PROVENTIL HFA;VENTOLIN HFA) 108 (90 BASE) MCG/ACT inhaler Inhale 2 puffs into the lungs every 6 (six) hours as needed for wheezing or shortness of breath.     Marland Kitchen aspirin 81 MG EC tablet Take 81 mg by mouth daily.     Marland Kitchen atorvastatin (LIPITOR) 40 MG tablet Take 40 mg by mouth daily.    Marland Kitchen azelastine (ASTELIN) 137 MCG/SPRAY nasal spray Place 1  spray into the nose 2 (two) times daily.     . beclomethasone (QVAR) 80 MCG/ACT inhaler Inhale 2 puffs into the lungs 3 (three) times daily.    . Calcium Carb-Cholecalciferol (CALCIUM 500 +D) 500-400 MG-UNIT TABS Take 1 tablet by mouth 2 (two) times daily.    . cyclobenzaprine (FLEXERIL) 10 MG tablet Take 10 mg by mouth 3 (three) times daily  as needed for muscle spasms.    . diazepam (VALIUM) 5 MG tablet Take 5 mg by mouth at bedtime as needed for sleep.    Marland Kitchen docusate sodium (COLACE) 100 MG capsule Take 200 mg by mouth 2 (two) times daily.     Marland Kitchen EPINEPHrine 0.3 mg/0.3 mL IJ SOAJ injection Inject 0.3 mg into the muscle once. As needed for anaphylaxis    . esomeprazole (NEXIUM) 40 MG capsule Take 40 mg by mouth at bedtime.     . fentaNYL (DURAGESIC - DOSED MCG/HR) 50 MCG/HR Place 50 mcg onto the skin every 3 (three) days. IN CONJUNCTION WITH ONE 12 MCG PATCH TO EQUAL A TOTAL OF 62 MICROGRAMS    . gabapentin (NEURONTIN) 600 MG tablet Take 300-600 mg by mouth at bedtime.     . Glucosamine-Chondroitin 750-600 MG TABS Take 2 tablets by mouth 2 (two) times daily.     Marland Kitchen HYDROmorphone (DILAUDID) 4 MG tablet Take 4-8 mg by mouth every 4 (four) hours as needed (pain).    Marland Kitchen levothyroxine (SYNTHROID, LEVOTHROID) 75 MCG tablet Take 75 mcg by mouth daily before breakfast.     . lidocaine (LIDODERM) 5 % Place 1 patch onto the skin daily. Remove & Discard patch within 12 hours or as directed by MD    . Loratadine-Pseudoephedrine (CLARITIN-D 12 HOUR PO) Take 1 tablet by mouth 2 (two) times daily.     . Melatonin 5 MG TABS Take 2.5-5 mg by mouth at bedtime as needed.    . metaxalone (SKELAXIN) 800 MG tablet Take 800 mg by mouth every 8 (eight) hours as needed. For muscle pain    . metoprolol (LOPRESSOR) 50 MG tablet Take 1 tablet (50 mg total) by mouth 2 (two) times daily. 180 tablet 3  . montelukast (SINGULAIR) 10 MG tablet Take 10 mg by mouth at bedtime.     . Multiple Vitamin (MULTIVITAMIN WITH MINERALS) TABS Take 1 tablet by mouth daily.    Marland Kitchen NITROSTAT 0.4 MG SL tablet DISSOLVE 1 TABLET UNDER TONGUE AS NEEDED FOR CHEST PAIN,MAY REPEAT IN5 MINUTES FOR 2 DOSES. 25 tablet 0  . oxyCODONE-acetaminophen (ROXICET) 5-325 MG tablet Take 1-2 tablets by mouth every 4 (four) hours as needed. 30 tablet 0  . Respiratory Therapy Supplies (FLUTTER) DEVI Use as  directed 1 each 0  . triamcinolone (NASACORT) 55 MCG/ACT nasal inhaler Place 1-2 sprays into the nose at bedtime.     Alveda Reasons 20 MG TABS tablet Take 1 tablet (20 mg total) by mouth daily after breakfast. 30 tablet 0   No current facility-administered medications on file prior to visit.     Review of Systems: No CP, SOB, rash, vomiting.  Some diarrhea and nausea after fatty foods No fever, chills, ST, cough As per HPI- otherwise negative.   Physical Examination: Vitals:   11/21/16 1403  BP: 106/78  Pulse: 78  Temp: 97.8 F (36.6 C)   Vitals:   11/21/16 1403  Weight: 166 lb (75.3 kg)  Height: 5\' 2"  (1.575 m)   Body mass index is 30.36 kg/m. Ideal Body Weight: Weight in (  lb) to have BMI = 25: 136.4  GEN: WDWN, NAD, Non-toxic, A & O x 3, looks well, overweight HEENT: Atraumatic, Normocephalic. Neck supple. No masses, No LAD.  Bilateral TM wnl, oropharynx normal.  PEERL,EOMI.   Ears and Nose: No external deformity. CV: RRR, No M/G/R. No JVD. No thrill. No extra heart sounds. PULM: CTA B, no wheezes, crackles, rhonchi. No retractions. No resp. distress. No accessory muscle use. ABD: S, NT, ND, +BS. No rebound. No HSM. Surgical wounds are healed EXTR: No c/c/e NEURO - uses a motorized scooter to get around but is able to transfer independently  PSYCH: Normally interactive. Conversant. Not depressed or anxious appearing.  Calm demeanor.    Assessment and Plan: Hypothyroidism, unspecified type - Plan: TSH, levothyroxine (SYNTHROID, LEVOTHROID) 75 MCG tablet  Anemia, unspecified type - Plan: CBC, Comprehensive metabolic panel, Ferritin  Candidal intertrigo - Plan: nystatin (MYCOSTATIN/NYSTOP) powder  Encounter for hepatitis C screening test for low risk patient - Plan: Hepatitis C antibody  Here today for a recheck following lap chole (s/p an episode of ascending cholangitis).  She is very happy to have her health back and credits God with saving her life.   Today will  repeat her CBC, get a TSH and Hep C for screening Refilled her nystatin  Will plan further follow- up pending labs.   Signed Lamar Blinks, MD  Received her labs 2/4.  Noted anemia since she got sick in August of 2017.  This appears to be trending up Colonoscopy was approx 6 years ago.  ferritin on the low side.  Will encourage increase iron intake and recheck in 3 months.  If still low them may need to update colon or at least do stool cards   Results for orders placed or performed in visit on 11/21/16  CBC  Result Value Ref Range   WBC 6.4 4.0 - 10.5 K/uL   RBC 4.12 3.87 - 5.11 Mil/uL   Platelets 269.0 150.0 - 400.0 K/uL   Hemoglobin 11.4 (L) 12.0 - 15.0 g/dL   HCT 34.4 (L) 36.0 - 46.0 %   MCV 83.4 78.0 - 100.0 fl   MCHC 33.2 30.0 - 36.0 g/dL   RDW 16.8 (H) 11.5 - 15.5 %  Comprehensive metabolic panel  Result Value Ref Range   Sodium 139 135 - 145 mEq/L   Potassium 4.4 3.5 - 5.1 mEq/L   Chloride 97 96 - 112 mEq/L   CO2 35 (H) 19 - 32 mEq/L   Glucose, Bld 93 70 - 99 mg/dL   BUN 15 6 - 23 mg/dL   Creatinine, Ser 0.76 0.40 - 1.20 mg/dL   Total Bilirubin 0.5 0.2 - 1.2 mg/dL   Alkaline Phosphatase 80 39 - 117 U/L   AST 22 0 - 37 U/L   ALT 17 0 - 35 U/L   Total Protein 7.5 6.0 - 8.3 g/dL   Albumin 4.5 3.5 - 5.2 g/dL   Calcium 10.5 8.4 - 10.5 mg/dL   GFR 80.02 >60.00 mL/min  TSH  Result Value Ref Range   TSH 4.10 0.35 - 4.50 uIU/mL  Ferritin  Result Value Ref Range   Ferritin 28.6 10.0 - 291.0 ng/mL  Hepatitis C antibody  Result Value Ref Range   HCV Ab NEGATIVE NEGATIVE

## 2016-11-22 DIAGNOSIS — H2513 Age-related nuclear cataract, bilateral: Secondary | ICD-10-CM | POA: Diagnosis not present

## 2016-11-22 LAB — CBC
HCT: 34.4 % — ABNORMAL LOW (ref 36.0–46.0)
HEMOGLOBIN: 11.4 g/dL — AB (ref 12.0–15.0)
MCHC: 33.2 g/dL (ref 30.0–36.0)
MCV: 83.4 fl (ref 78.0–100.0)
PLATELETS: 269 10*3/uL (ref 150.0–400.0)
RBC: 4.12 Mil/uL (ref 3.87–5.11)
RDW: 16.8 % — ABNORMAL HIGH (ref 11.5–15.5)
WBC: 6.4 10*3/uL (ref 4.0–10.5)

## 2016-11-22 LAB — COMPREHENSIVE METABOLIC PANEL
ALT: 17 U/L (ref 0–35)
AST: 22 U/L (ref 0–37)
Albumin: 4.5 g/dL (ref 3.5–5.2)
Alkaline Phosphatase: 80 U/L (ref 39–117)
BUN: 15 mg/dL (ref 6–23)
CALCIUM: 10.5 mg/dL (ref 8.4–10.5)
CHLORIDE: 97 meq/L (ref 96–112)
CO2: 35 meq/L — AB (ref 19–32)
Creatinine, Ser: 0.76 mg/dL (ref 0.40–1.20)
GFR: 80.02 mL/min (ref >60.00)
Glucose, Bld: 93 mg/dL (ref 70–99)
Potassium: 4.4 mEq/L (ref 3.5–5.1)
Sodium: 139 mEq/L (ref 135–145)
Total Bilirubin: 0.5 mg/dL (ref 0.2–1.2)
Total Protein: 7.5 g/dL (ref 6.0–8.3)

## 2016-11-22 LAB — HEPATITIS C ANTIBODY: HCV AB: NEGATIVE

## 2016-11-22 LAB — FERRITIN: FERRITIN: 28.6 ng/mL (ref 10.0–291.0)

## 2016-11-22 LAB — TSH: TSH: 4.1 u[IU]/mL (ref 0.35–4.50)

## 2016-11-25 ENCOUNTER — Encounter: Payer: Self-pay | Admitting: Family Medicine

## 2016-12-04 DIAGNOSIS — Z79891 Long term (current) use of opiate analgesic: Secondary | ICD-10-CM | POA: Diagnosis not present

## 2016-12-04 DIAGNOSIS — M4722 Other spondylosis with radiculopathy, cervical region: Secondary | ICD-10-CM | POA: Diagnosis not present

## 2016-12-04 DIAGNOSIS — M963 Postlaminectomy kyphosis: Secondary | ICD-10-CM | POA: Diagnosis not present

## 2016-12-04 DIAGNOSIS — G894 Chronic pain syndrome: Secondary | ICD-10-CM | POA: Diagnosis not present

## 2016-12-05 ENCOUNTER — Encounter: Payer: Self-pay | Admitting: Family Medicine

## 2016-12-05 DIAGNOSIS — E2839 Other primary ovarian failure: Secondary | ICD-10-CM

## 2016-12-05 DIAGNOSIS — Z1239 Encounter for other screening for malignant neoplasm of breast: Secondary | ICD-10-CM

## 2016-12-17 ENCOUNTER — Telehealth: Payer: Self-pay | Admitting: Cardiovascular Disease

## 2016-12-17 MED ORDER — ATORVASTATIN CALCIUM 40 MG PO TABS: 40.0000 mg | ORAL_TABLET | Freq: Every day | ORAL | 3 refills | Status: DC

## 2016-12-17 NOTE — Telephone Encounter (Signed)
I spoke with pt. She is asking if Dr. Angelena Form will refill atorvastatin.  I told her this would be fine. Will send 90 day prescription to [facility].

## 2016-12-17 NOTE — Telephone Encounter (Signed)
New Message  Pt c/o medication issue:  1. Name of Medication: atorvastatin Lipitor 40 mg tablet once daily  2. How are you currently taking this medication (dosage and times per day)? See above  3. Are you having a reaction (difficulty breathing--STAT)? N/A  4. What is your medication issue? Pt voiced wanting to speak to nurse in regards to having this medication prescribed by cardiologist.  Husband voiced pharmacy he uses just in case if you decide to send [facility].  Please f/u if needed

## 2017-01-01 DIAGNOSIS — M4722 Other spondylosis with radiculopathy, cervical region: Secondary | ICD-10-CM | POA: Diagnosis not present

## 2017-01-01 DIAGNOSIS — Z79891 Long term (current) use of opiate analgesic: Secondary | ICD-10-CM | POA: Diagnosis not present

## 2017-01-01 DIAGNOSIS — M963 Postlaminectomy kyphosis: Secondary | ICD-10-CM | POA: Diagnosis not present

## 2017-01-01 DIAGNOSIS — G894 Chronic pain syndrome: Secondary | ICD-10-CM | POA: Diagnosis not present

## 2017-01-05 ENCOUNTER — Other Ambulatory Visit: Payer: Self-pay | Admitting: Nurse Practitioner

## 2017-01-23 ENCOUNTER — Telehealth: Payer: Self-pay | Admitting: *Deleted

## 2017-01-23 NOTE — Telephone Encounter (Signed)
AWV scheduled for 12/28/16 @3 .

## 2017-01-25 NOTE — Progress Notes (Signed)
Pre visit review using our clinic review tool, if applicable. No additional management support is needed unless otherwise documented below in the visit note. 

## 2017-01-25 NOTE — Progress Notes (Signed)
Subjective:   Latoya [name] is a 71 y.o. female who presents for Medicare Annual (Subsequent) preventive examination.  Review of Systems:  No ROS.  Medicare Wellness Visit.  Cardiac Risk Factors include: advanced age (>44men, >67 women);dyslipidemia;sedentary lifestyle;obesity (BMI >30kg/m2);hypertension   Sleep patterns: feels rested on waking and sleeps 12 hours nightly. She has had interrupted sleep since hospitalization in October, but reports her sleep patterns are improving.  Home Safety/Smoke Alarms: Feels safe in home. Smoke alarms in place.   Living environment; residence and Firearm Safety: Lives w/ husband in [place] house/ trailer, number of outside stairs: 3, equipment: Radio producer, Type: Narrow [address] [place], [place], Type: Conservation officer, nature and [employer], Type: Tub Surveyor, quantity, no firearms. Walks w/ walker at home, but is hoping to progress to being able to walk with a cane again. Grab bars in shower. Uses power wheelchair for long distances. She is in her wheelchair today. Seat Belt Safety/Bike Helmet: Wears seat belt.   Counseling:   Eye Exam- Dr. Luretha Rued yearly.  Dental- Dr. Victorino Dike twice yearly.  Female:   Pap- N/A; hysterectomy     Mammo-  Last 01/30/16: BI-RADS CATEGORY 2: Benign   Dexa scan-  Last 12/13/15: Normal      CCS- last 01/2011, 5 year recall.    Objective:     Vitals: BP (!) 154/92   Pulse 76   Temp 97.5 F (36.4 C) (Oral)   Resp 18   Ht 5\' 2"  (1.575 m)   Wt 168 lb 6.4 oz (76.4 kg)   SpO2 98%   BMI 30.80 kg/m   Body mass index is 30.8 kg/m.   Tobacco History  Smoking Status  . Never Smoker  Smokeless Tobacco  . Never Used    Comment: no smoking      Counseling given: Not Answered   Past Medical History:  Diagnosis Date  . Anemia, iron deficiency   . Antiphospholipid syndrome (HCC)    with hypercoagulable state  . Asthma    extrinsic; moderate, persistant, nml spirometry 2010, nml CXR 1/08  . Bladder troubles    REPORTS  INFECTIONS ON OCCASION DUE TO URETHRA MEATUS STRICTURE AT BIRTH   . Blood dyscrasia    antiphospholipid disorder  . CAD (coronary artery disease)    50% mid LAD, 80% ostial D1 and moderate 80% mid circ by vath 2003  . Cancer ([place])    basal cell removed fr. L arm   . Complication of anesthesia    cardiac arrest- in OR, at age 89 (60)y.o. during Scalenotomy in her early 20's  . Coronary heart disease   . Drug allergy    heparin/lovenox  . DVT (deep venous thrombosis) ([place])   . Erosive gastritis 1994  . Family history of adverse reaction to anesthesia    some family members have had trouble waking up  . GERD (gastroesophageal reflux disease)   . H/O hiatal hernia   . Heart murmur   . HLD (hyperlipidemia)   . HTN (hypertension)    Pt states her high blood pressure readings are due tot he method of measurementsMcAlhaney at [facility], manages pt.  LOV 03/2014  . Hypothyroidism   . Liver spot    cyst- - no biopsy, but told that its benign   . PAF (paroxysmal atrial fibrillation) (HCC)    not on coumadin therapy  . Pulmonary embolism ([place])    related to back surgery with prior coumadin use, now off  . Spondylosis, lumbosacral  ARTHRITIS- OA    Past Surgical History:  Procedure Laterality Date  . ABDOMINAL HYSTERECTOMY     partial abdominal -1998  . BACK SURGERY     x13 cervical and lumbar thoracic spine surgery  . BREAST ENHANCEMENT SURGERY    . BREAST SURGERY     all benign cysts x3   . CARDIAC CATHETERIZATION     2005  . CHOLECYSTECTOMY N/A 10/04/2016   Procedure: LAPAROSCOPIC CHOLECYSTECTOMY;  Surgeon: Erroll Luna, MD;  [facility];  Service: General;  Laterality: N/A;  . cleft lip and palate repair     71 yo   . hysterectomy    . IR GENERIC HISTORICAL  06/04/2016   IR PERC CHOLECYSTOSTOMY 06/04/2016 Greggory Keen, MD [facility]  . IR GENERIC HISTORICAL  08/06/2016   IR CHOLANGIOGRAM EXISTING TUBE 08/06/2016 Aletta Edouard, MD [facility]  . IR GENERIC  HISTORICAL  07/17/2016   IR RADIOLOGIST EVAL & MGMT 07/17/2016 [facility]  . JOINT REPLACEMENT    . POSTERIOR CERVICAL FUSION/FORAMINOTOMY Right 08/31/2014   Procedure: Right cervical six-seven foraminotomy, Cervical five-Thoracic one fusion, Cervical six-seven lateral mass screws;  Surgeon: Erline Levine, MD;  Location: [facility];  Service: Neurosurgery;  Laterality: Right;  . spina bifida repair     71 yo   . TONSILLECTOMY    . TOTAL KNEE ARTHROPLASTY     left   Family History  Problem Relation Age of Onset  . Heart disease Father     MI   . Heart disease Sister    History  Sexual Activity  . Sexual activity: Not on file    Outpatient Encounter Prescriptions as of 01/28/2017  Medication Sig  . albuterol (PROVENTIL HFA;VENTOLIN HFA) 108 (90 BASE) MCG/ACT inhaler Inhale 2 puffs into the lungs every 6 (six) hours as needed for wheezing or shortness of breath.   Marland Kitchen aspirin 81 MG EC tablet Take 81 mg by mouth daily.   Marland Kitchen atorvastatin (LIPITOR) 40 MG tablet Take 1 tablet (40 mg total) by mouth daily.  Marland Kitchen azelastine (ASTELIN) 137 MCG/SPRAY nasal spray Place 1 spray into the nose 2 (two) times daily.   . beclomethasone (QVAR) 80 MCG/ACT inhaler Inhale 2 puffs into the lungs 3 (three) times daily.  . Calcium Carb-Cholecalciferol (CALCIUM 500 +D) 500-400 MG-UNIT TABS Take 1 tablet by mouth 2 (two) times daily.  . cyclobenzaprine (FLEXERIL) 10 MG tablet Take 10 mg by mouth 3 (three) times daily as needed for muscle spasms.  Marland Kitchen docusate sodium (COLACE) 100 MG capsule Take 200 mg by mouth 2 (two) times daily.   Marland Kitchen EPINEPHrine 0.3 mg/0.3 mL IJ SOAJ injection Inject 0.3 mg into the muscle once. As needed for anaphylaxis  . esomeprazole (NEXIUM) 40 MG capsule Take 40 mg by mouth at bedtime.   . fentaNYL (DURAGESIC - DOSED MCG/HR) 50 MCG/HR Place 50 mcg onto the skin every 3 (three) days. IN CONJUNCTION WITH ONE 12 MCG PATCH TO EQUAL A TOTAL OF 62 MICROGRAMS  . gabapentin (NEURONTIN) 600 MG tablet  Take 300-600 mg by mouth at bedtime.   . Glucosamine-Chondroitin 750-600 MG TABS Take 2 tablets by mouth 2 (two) times daily.   Marland Kitchen HYDROmorphone (DILAUDID) 4 MG tablet Take 4-8 mg by mouth every 4 (four) hours as needed (pain).  Marland Kitchen levothyroxine (SYNTHROID, LEVOTHROID) 75 MCG tablet Take 1 tablet (75 mcg total) by mouth daily before breakfast.  . lidocaine (LIDODERM) 5 % Place 1 patch onto the skin daily. Remove & Discard patch  within 12 hours or as directed by MD  . Loratadine-Pseudoephedrine (CLARITIN-D 12 HOUR PO) Take 1 tablet by mouth 2 (two) times daily.   . Melatonin 5 MG TABS Take 2.5-5 mg by mouth at bedtime as needed.  . metaxalone (SKELAXIN) 800 MG tablet Take 800 mg by mouth every 8 (eight) hours as needed. For muscle pain  . metoprolol (LOPRESSOR) 50 MG tablet TAKE 1 TABLET TWICE DAILY.  . montelukast (SINGULAIR) 10 MG tablet Take 10 mg by mouth at bedtime.   . Multiple Vitamin (MULTIVITAMIN WITH MINERALS) TABS Take 1 tablet by mouth daily.  Marland Kitchen NITROSTAT 0.4 MG SL tablet DISSOLVE 1 TABLET UNDER TONGUE AS NEEDED FOR CHEST PAIN,MAY REPEAT IN5 MINUTES FOR 2 DOSES.  Marland Kitchen nystatin (MYCOSTATIN/NYSTOP) powder Apply topically 4 (four) times daily. Use as needed for rash  . oxyCODONE-acetaminophen (ROXICET) 5-325 MG tablet Take 1-2 tablets by mouth every 4 (four) hours as needed.  Marland Kitchen Respiratory Therapy Supplies (FLUTTER) DEVI Use as directed  . triamcinolone (NASACORT) 55 MCG/ACT nasal inhaler Place 1-2 sprays into the nose at bedtime.   Alveda Reasons 20 MG TABS tablet Take 1 tablet (20 mg total) by mouth daily after breakfast.  . diazepam (VALIUM) 5 MG tablet Take 5 mg by mouth at bedtime as needed for sleep.   No facility-administered encounter medications on file as of 01/28/2017.     Activities of Daily Living In your present state of health, do you have any difficulty performing the following activities: 01/28/2017 09/26/2016  Hearing? N N  Vision? N N  Difficulty concentrating or making  decisions? N N  Walking or climbing stairs? Y Y  Dressing or bathing? N N  Doing errands, shopping? N -  Preparing Food and eating ? N -  Using the Toilet? N -  In the past six months, have you accidently leaked urine? Y -  Do you have problems with loss of bowel control? N -  Managing your Medications? N -  Managing your Finances? N -  Housekeeping or managing your Housekeeping? Y -  Some recent data might be hidden    Patient Care Team: Darreld Mclean, MD as PCP - General (Family Medicine) Nicholaus Bloom, MD as Consulting Physician (Anesthesiology) Erline Levine, MD as Consulting Physician (Neurosurgery) Mosetta Anis, MD as Consulting Physician (Allergy) Tanda Rockers, MD as Consulting Physician (Pulmonary Disease) Burnell Blanks, MD as Consulting Physician (Cardiology) Macarthur Critchley, Friend as Consulting Physician (Optometry) Druscilla Brownie, MD as Consulting Physician (Dermatology) Paralee Cancel, MD as Consulting Physician (Hematology and Oncology)    Assessment:    Physical assessment deferred to PCP.  Exercise Activities and Dietary recommendations Current Exercise Habits: The patient does not participate in regular exercise at present, but is planning to restart water aerobics 2-3x weekly.  Diet (meal preparation, eat out, water intake, caffeinated beverages, dairy products, fruits and vegetables): in general, a "healthy" diet  , well balanced, on average, 2 meals per day. Tries to avoid caffeine. Reports she eats too much take out. Does not eat fried food. Reports she likes fruits and vegetables. Drinks fluids throughout the day.  Goals    . Increase physical activity          Return to water aerobics tolerated.      Fall Risk Fall Risk  01/28/2017  Falls in the past year? No   Depression Screen PHQ 2/9 Scores 01/28/2017  PHQ - 2 Score 0     Cognitive Function MMSE - Mini Mental  State Exam 01/28/2017  Orientation to time 5  Orientation to Place 5    Registration 3  Attention/ Calculation 5  Recall 3  Language- name 2 objects 2  Language- repeat 1  Language- follow 3 step command 3  Language- read & follow direction 1  Write a sentence 1  Copy design 1  Total score 30        Immunization History  Administered Date(s) Administered  . Influenza Split 07/22/2016  . Influenza-Unspecified 07/22/2013, 06/22/2014  . Pneumococcal Conjugate-13 08/09/2014  . Pneumococcal-Unspecified 07/22/2013  . Tdap 01/28/2017   Screening Tests Health Maintenance  Topic Date Due  . INFLUENZA VACCINE  05/22/2017  . MAMMOGRAM  01/29/2018  . COLONOSCOPY  01/20/2021  . TETANUS/TDAP  01/29/2027  . DEXA SCAN  Completed  . Hepatitis C Screening  Completed  . PNA vac Low Risk Adult  Completed      Plan:    Follow-up w/ PCP as directed.  Bring a copy of your advance directives to your next office visit.  Orders placed for MMG and referral to GI for colonoscopy.   During the course of the visit the patient was educated and counseled about the following appropriate screening and preventive services:   Vaccines to include Pneumococcal, Influenza, Td, HCV  Cardiovascular Disease  Colorectal cancer screening  Bone density screening  Diabetes screening  Glaucoma screening  Mammography  Nutrition counseling   Patient Instructions (the written plan) was given to the patient.   Dorrene German, RN  01/28/2017

## 2017-01-28 ENCOUNTER — Ambulatory Visit (INDEPENDENT_AMBULATORY_CARE_PROVIDER_SITE_OTHER): Payer: Medicare Other | Admitting: Family Medicine

## 2017-01-28 ENCOUNTER — Encounter: Payer: Self-pay | Admitting: Family Medicine

## 2017-01-28 VITALS — BP 154/92 | HR 76 | Temp 97.5°F | Resp 18 | Ht 62.0 in | Wt 168.4 lb

## 2017-01-28 DIAGNOSIS — Z0000 Encounter for general adult medical examination without abnormal findings: Secondary | ICD-10-CM

## 2017-01-28 DIAGNOSIS — Z1211 Encounter for screening for malignant neoplasm of colon: Secondary | ICD-10-CM

## 2017-01-28 DIAGNOSIS — Z23 Encounter for immunization: Secondary | ICD-10-CM

## 2017-01-28 DIAGNOSIS — R7303 Prediabetes: Secondary | ICD-10-CM

## 2017-01-28 DIAGNOSIS — Z862 Personal history of diseases of the blood and blood-forming organs and certain disorders involving the immune mechanism: Secondary | ICD-10-CM

## 2017-01-28 DIAGNOSIS — Z1231 Encounter for screening mammogram for malignant neoplasm of breast: Secondary | ICD-10-CM

## 2017-01-28 DIAGNOSIS — Z1239 Encounter for other screening for malignant neoplasm of breast: Secondary | ICD-10-CM

## 2017-01-28 NOTE — Progress Notes (Signed)
[place] [facility] [address], [place], [place] [address] [phone]  Date:  01/28/2017   Name:  Latoya [name]   DOB:  [birth year]   MRN:  [id number]  PCP:  Lamar Blinks, MD    Chief Complaint: Medicare Wellness (w/ RN ) and Follow-up (Routine follow-up. Would like to have iron level checked.)   History of Present Illness:  Latoya [name] is a 71 y.o. very pleasant female patient who presents with the following:  Is doing better, compared to her last visit in January.    Feels like she is getting stronger.  Has finished physcial therapy.  Doing range of motion exercises.  Is a member of a gym, but their pool is being fixed.  Will be going back for pool exercise, once the pool heater is fixed.  Is going to check out a dance fitness program.  Uses a walker sometimes inside the house.  When going out, long distances, she uses a scooter.  No real differences in significant bleeding problems, while on Xarelto.  She will have an occasional nosebleed or bruise  Denies SOB.  Started back using QVAR last week.  Hasn't used Aluberol recently.  Denies any pain today.  Working on improving urinary incontinence.  Avoids caffeine.  Wears a pad, only at night.  Denies any GI symptoms.  Has not been checking her blood pressure at home.  She has stopped taking her valium. She would like to come off the fentanyl patch eventually but knows that this will need to be weaned.  She is working with her pain management doctor Her appetite is good Her Hg has been improving steadily and she would like to check on this and her iron level today  Her A1c has been in the pre-diabetes range in the last   She is also coming due for a mammogram and would like to have this done at [facility]- we ordered it for her Finally, she is due for a tetanus shot and would like to have this done today  Lab Results  Component Value Date   HGBA1C 5.8 12/21/2015      Patient Active Problem List   Diagnosis Date Noted  . Pre-diabetes 09/25/2016  . Healthcare-associated pneumonia 08/04/2016  . Leukocytosis 08/04/2016  . Nausea with vomiting 08/04/2016  . Hypothyroidism 08/04/2016  . Anemia of chronic disease 08/04/2016  . Cholangitis   . Gastroparesis   . Acute respiratory failure ([place])   . Enterococcal bacteremia 06/06/2016  . Bacteremia due to Klebsiella pneumoniae 06/06/2016  . Bacteremia due to Escherichia coli 06/06/2016  . Acute respiratory failure with hypoxia ([place])   . Ascending cholangitis 06/04/2016  . Hemangioma of liver 06/04/2016  . Cervical pseudoarthrosis ([place]) 08/31/2014  . Anaphylaxis, mild, due to wasp envenomation 04/12/2013  . Chest pain, atypical 04/11/2013  . PAD (peripheral artery disease) ([place]) 06/20/2011  . ANEMIA, IRON DEFICIENCY, MICROCYTIC 06/01/2010  . Primary hypercoagulable state ([place]) 06/01/2010  . BACK PAIN, CHRONIC 06/01/2010  . ABDOMINAL PAIN -GENERALIZED 06/01/2010  . CAROTID ARTERY DISEASE 05/25/2010  . CAD, NATIVE VESSEL 11/15/2009  . MURMUR 11/15/2009  . CAROTID BRUIT, LEFT 11/15/2009  . Essential hypertension 03/18/2009  . History of pulmonary embolism 03/18/2009  . PAROXYSMAL ATRIAL FIBRILLATION 03/18/2009  . DVT 03/18/2009  . Asthma 03/18/2009  . GERD 03/18/2009  . SPONDYLOSIS, LUMBAR 03/18/2009  . HYPERLIPIDEMIA 11/29/2008  . CORONARY HEART DISEASE 11/29/2008    Past Medical History:  Diagnosis Date  . Anemia, iron deficiency   . Antiphospholipid syndrome (HCC)    with hypercoagulable state  . Asthma    extrinsic; moderate, persistant, nml spirometry 2010, nml CXR 1/08  . Bladder troubles    REPORTS INFECTIONS ON OCCASION DUE TO URETHRA MEATUS STRICTURE AT BIRTH   . Blood dyscrasia    antiphospholipid disorder  . CAD (coronary artery disease)    50% mid LAD, 80% ostial D1 and moderate 80% mid circ by vath 2003  . Cancer ([place])    basal cell removed fr. L arm   . Complication of  anesthesia    cardiac arrest- in OR, at age 38 (40)y.o. during Scalenotomy in her early 20's  . Coronary heart disease   . Drug allergy    heparin/lovenox  . DVT (deep venous thrombosis) ([place])   . Erosive gastritis 1994  . Family history of adverse reaction to anesthesia    some family members have had trouble waking up  . GERD (gastroesophageal reflux disease)   . H/O hiatal hernia   . Heart murmur   . HLD (hyperlipidemia)   . HTN (hypertension)    Pt states her high blood pressure readings are due tot he method of measurementsMcAlhaney at [facility], manages pt.  LOV 03/2014  . Hypothyroidism   . Liver spot    cyst- - no biopsy, but told that its benign   . PAF (paroxysmal atrial fibrillation) (HCC)    not on coumadin therapy  . Pulmonary embolism ([place])    related to back surgery with prior coumadin use, now off  . Spondylosis, lumbosacral    ARTHRITIS- OA     Past Surgical History:  Procedure Laterality Date  . ABDOMINAL HYSTERECTOMY     partial abdominal -1998  . BACK SURGERY     x13 cervical and lumbar thoracic spine surgery  . BREAST ENHANCEMENT SURGERY    . BREAST SURGERY     all benign cysts x3   . CARDIAC CATHETERIZATION     2005  . CHOLECYSTECTOMY N/A 10/04/2016   Procedure: LAPAROSCOPIC CHOLECYSTECTOMY;  Surgeon: Erroll Luna, MD;  [facility];  Service: General;  Laterality: N/A;  . cleft lip and palate repair     71 yo   . hysterectomy    . IR GENERIC HISTORICAL  06/04/2016   IR PERC CHOLECYSTOSTOMY 06/04/2016 Greggory Keen, MD [facility]  . IR GENERIC HISTORICAL  08/06/2016   IR CHOLANGIOGRAM EXISTING TUBE 08/06/2016 Aletta Edouard, MD [facility]  . IR GENERIC HISTORICAL  07/17/2016   IR RADIOLOGIST EVAL & MGMT 07/17/2016 [facility]  . JOINT REPLACEMENT    . POSTERIOR CERVICAL FUSION/FORAMINOTOMY Right 08/31/2014   Procedure: Right cervical six-seven foraminotomy, Cervical five-Thoracic one fusion, Cervical six-seven lateral mass screws;   Surgeon: Erline Levine, MD;  Location: [facility];  Service: Neurosurgery;  Laterality: Right;  . spina bifida repair     71 yo   . TONSILLECTOMY    . TOTAL KNEE ARTHROPLASTY     left    Social History  Substance Use Topics  . Smoking status: Never Smoker  . Smokeless tobacco: Never Used     Comment: no smoking   . Alcohol use No    Family History  Problem Relation Age of Onset  . Heart disease Father     MI   . Heart disease Sister     Allergies  Allergen Reactions  . Chlorhexidine Anaphylaxis and Itching    Sensitive to  dye in Betadine & Chlorohexadine   . Enoxaparin Sodium Anaphylaxis  . Heparin Anaphylaxis  . Hornet Venom Anaphylaxis    European Hornets  . Other Anaphylaxis and Itching    Sensitive to dye in Betadine & Chlorohexadine   . Pork-Derived Products Anaphylaxis  . Povidone-Iodine Itching and Rash    NOTE: Patient has had anaphylactic reaction to Betadine due to dyes in product. Sensitivity-" but if its wiped off she is able to tolerate betadine"   . Indomethacin Rash  . Lactose Intolerance (Gi) Diarrhea and Nausea And Vomiting    Can tolerate most [place] cheeses (Takes Lactaid)  . Phenylpropanolamine Hypertension    Medication list has been reviewed and updated.  Current Outpatient Prescriptions on File Prior to Visit  Medication Sig Dispense Refill  . albuterol (PROVENTIL HFA;VENTOLIN HFA) 108 (90 BASE) MCG/ACT inhaler Inhale 2 puffs into the lungs every 6 (six) hours as needed for wheezing or shortness of breath.     Marland Kitchen aspirin 81 MG EC tablet Take 81 mg by mouth daily.     Marland Kitchen atorvastatin (LIPITOR) 40 MG tablet Take 1 tablet (40 mg total) by mouth daily. 90 tablet 3  . azelastine (ASTELIN) 137 MCG/SPRAY nasal spray Place 1 spray into the nose 2 (two) times daily.     . beclomethasone (QVAR) 80 MCG/ACT inhaler Inhale 2 puffs into the lungs 3 (three) times daily.    . Calcium Carb-Cholecalciferol (CALCIUM 500 +D) 500-400 MG-UNIT TABS Take 1 tablet by  mouth 2 (two) times daily.    . cyclobenzaprine (FLEXERIL) 10 MG tablet Take 10 mg by mouth 3 (three) times daily as needed for muscle spasms.    Marland Kitchen docusate sodium (COLACE) 100 MG capsule Take 200 mg by mouth 2 (two) times daily.     Marland Kitchen EPINEPHrine 0.3 mg/0.3 mL IJ SOAJ injection Inject 0.3 mg into the muscle once. As needed for anaphylaxis    . esomeprazole (NEXIUM) 40 MG capsule Take 40 mg by mouth at bedtime.     . fentaNYL (DURAGESIC - DOSED MCG/HR) 50 MCG/HR Place 50 mcg onto the skin every 3 (three) days. IN CONJUNCTION WITH ONE 12 MCG PATCH TO EQUAL A TOTAL OF 62 MICROGRAMS    . gabapentin (NEURONTIN) 600 MG tablet Take 300-600 mg by mouth at bedtime.     . Glucosamine-Chondroitin 750-600 MG TABS Take 2 tablets by mouth 2 (two) times daily.     Marland Kitchen HYDROmorphone (DILAUDID) 4 MG tablet Take 4-8 mg by mouth every 4 (four) hours as needed (pain).    Marland Kitchen levothyroxine (SYNTHROID, LEVOTHROID) 75 MCG tablet Take 1 tablet (75 mcg total) by mouth daily before breakfast. 90 tablet 3  . lidocaine (LIDODERM) 5 % Place 1 patch onto the skin daily. Remove & Discard patch within 12 hours or as directed by MD    . Loratadine-Pseudoephedrine (CLARITIN-D 12 HOUR PO) Take 1 tablet by mouth 2 (two) times daily.     . Melatonin 5 MG TABS Take 2.5-5 mg by mouth at bedtime as needed.    . metaxalone (SKELAXIN) 800 MG tablet Take 800 mg by mouth every 8 (eight) hours as needed. For muscle pain    . metoprolol (LOPRESSOR) 50 MG tablet TAKE 1 TABLET TWICE DAILY. 180 tablet 0  . montelukast (SINGULAIR) 10 MG tablet Take 10 mg by mouth at bedtime.     . Multiple Vitamin (MULTIVITAMIN WITH MINERALS) TABS Take 1 tablet by mouth daily.    Marland Kitchen NITROSTAT 0.4 MG SL tablet DISSOLVE  1 TABLET UNDER TONGUE AS NEEDED FOR CHEST PAIN,MAY REPEAT IN5 MINUTES FOR 2 DOSES. 25 tablet 0  . nystatin (MYCOSTATIN/NYSTOP) powder Apply topically 4 (four) times daily. Use as needed for rash 45 g 2  . oxyCODONE-acetaminophen (ROXICET) 5-325 MG  tablet Take 1-2 tablets by mouth every 4 (four) hours as needed. 30 tablet 0  . Respiratory Therapy Supplies (FLUTTER) DEVI Use as directed 1 each 0  . triamcinolone (NASACORT) 55 MCG/ACT nasal inhaler Place 1-2 sprays into the nose at bedtime.     Alveda Reasons 20 MG TABS tablet Take 1 tablet (20 mg total) by mouth daily after breakfast. 30 tablet 0  . diazepam (VALIUM) 5 MG tablet Take 5 mg by mouth at bedtime as needed for sleep.     No current facility-administered medications on file prior to visit.     Review of Systems:  As per HPI- otherwise negative.   Physical Examination: Vitals:   01/28/17 1516  BP: (!) 154/92  Pulse: 76  Resp: 18  Temp: 97.5 F (36.4 C)   Vitals:   01/28/17 1516  Weight: 168 lb 6.4 oz (76.4 kg)  Height: 5\' 2"  (1.575 m)   Body mass index is 30.8 kg/m. Ideal Body Weight: Weight in (lb) to have BMI = 25: 136.4  GEN: WDWN, NAD, Non-toxic, A & O x 3, overweight, looks well HEENT: Atraumatic, Normocephalic. Neck supple. No masses, No LAD. Ears and Nose: No external deformity. CV: RRR, No M/G/R. No JVD. No thrill. No extra heart sounds. PULM: CTA B, no wheezes, crackles, rhonchi. No retractions. No resp. distress. No accessory muscle use. ABD: S, NT, ND, +BS. No rebound. No HSM. EXTR: No c/c/e PSYCH: Normally interactive. Conversant. Not depressed or anxious appearing.  Calm demeanor.  Using her scooter as per her normal   Assessment and Plan:  Screening for breast cancer - Plan: MM Digital Screening  Need for Tdap vaccination - Plan: Tdap vaccine greater than or equal to 7yo IM  Screening for malignant neoplasm of colon - Plan: Ambulatory referral to Gastroenterology  Pre-diabetes - Plan: Hemoglobin A1c  History of anemia - Plan: Ferritin, CBC  Encounter for Medicare annual wellness exam  Here today for her medicare wellness exam and also a follow-up visit Labs pending as above She is due for a colonoscopy so will refer to GI Will also  refer for her mammogram tdap updated today  BP Readings from Last 3 Encounters:  01/28/17 (!) 154/92  11/21/16 106/78  10/04/16 (!) 164/70     Signed Lamar Blinks, MD I have reviewed medicare wellness exam documentation by RN staff and agree

## 2017-01-28 NOTE — Patient Instructions (Signed)
It was good to see you today!  Take care and I will be in touch with your labs, we will also schedule you for a mammogram

## 2017-01-29 ENCOUNTER — Encounter: Payer: Self-pay | Admitting: Family Medicine

## 2017-01-29 DIAGNOSIS — Z79891 Long term (current) use of opiate analgesic: Secondary | ICD-10-CM | POA: Diagnosis not present

## 2017-01-29 DIAGNOSIS — M4722 Other spondylosis with radiculopathy, cervical region: Secondary | ICD-10-CM | POA: Diagnosis not present

## 2017-01-29 DIAGNOSIS — G894 Chronic pain syndrome: Secondary | ICD-10-CM | POA: Diagnosis not present

## 2017-01-29 DIAGNOSIS — M963 Postlaminectomy kyphosis: Secondary | ICD-10-CM | POA: Diagnosis not present

## 2017-01-29 LAB — CBC
HEMATOCRIT: 40.9 % (ref 36.0–46.0)
Hemoglobin: 13.8 g/dL (ref 12.0–15.0)
MCHC: 33.6 g/dL (ref 30.0–36.0)
MCV: 84.8 fl (ref 78.0–100.0)
Platelets: 260 10*3/uL (ref 150.0–400.0)
RBC: 4.83 Mil/uL (ref 3.87–5.11)
RDW: 13.5 % (ref 11.5–15.5)
WBC: 6.1 10*3/uL (ref 4.0–10.5)

## 2017-01-29 LAB — FERRITIN: Ferritin: 14.2 ng/mL (ref 10.0–291.0)

## 2017-01-29 LAB — HEMOGLOBIN A1C: Hgb A1c MFr Bld: 5.7 % (ref 4.6–6.5)

## 2017-01-30 ENCOUNTER — Encounter: Payer: Self-pay | Admitting: Family Medicine

## 2017-01-30 DIAGNOSIS — Z1231 Encounter for screening mammogram for malignant neoplasm of breast: Secondary | ICD-10-CM | POA: Diagnosis not present

## 2017-02-12 ENCOUNTER — Telehealth: Payer: Self-pay | Admitting: *Deleted

## 2017-02-12 MED ORDER — RIVAROXABAN 20 MG PO TABS: 20.0000 mg | ORAL_TABLET | Freq: Every day | ORAL | 1 refills | Status: DC

## 2017-02-12 NOTE — Telephone Encounter (Signed)
Order History  Historical Medication  Date/Time Action Taken User Additional Information  08/04/16 [phone] Historical Med Noted Dessie Coma, CPhT   08/04/16 8828 Taking [place], CPhT   08/04/16 0300 Order for Admission Rise Patience, MD ToOrder:[id number]  08/07/16 1224 Resume at Discharge Shon Millet, DO   09/12/16 1227 Taking [place], [place]   09/21/16 2006 Taking [address] Eustace Moore, CPhT   10/04/16 1406 Modify at Discharge/Discontinue Erroll Luna, MD [phone]  This Order Has Been Discontinued   Order Status Reason By On  Discontinued None Erroll Luna, MD 10/04/16 1406      Medication Detail    Disp Refills Start End   XARELTO 20 MG TABS tablet (Discontinued)   07/24/2016 10/04/2016   Sig - Route: Take 20 mg by mouth daily after breakfast. - Oral   Class: Historical Med    XARELTO 20 MG TABS tablet  Medication  Date: 10/04/2016 Department: [facility] Ordering/Authorizing: Erroll Luna, MD  Order Providers   Prescribing Provider Encounter Provider  Erroll Luna, MD None  Medication Detail    Disp Refills Start End   XARELTO 20 MG TABS tablet 30 tablet 0 10/04/2016    Sig - Route: Take 1 tablet (20 mg total) by mouth daily after breakfast. - Oral   Notes to Pharmacy: [facility] on sunday   E-Prescribing Status: Receipt confirmed by pharmacy (10/04/2016 2:07 PM EST)   Pharmacy   [facility]   Pt has never had Xarelto 20 mg filled by Dr Angelena Form, it was put in her record 09/12/16 from an outside source, then [employer] gave last RX 10/04/16 @ discharge, I do not know where she has been getting it since then, no records/notes seen as to [place], Pharmacy requested refill from Dr Angelena Form, please advise.

## 2017-02-12 NOTE — Telephone Encounter (Signed)
Reviewed with Eino Farber, PharmD and pt on appropriate dose of Xarelto.  Will send refill to pharmacy.

## 2017-02-21 ENCOUNTER — Ambulatory Visit: Payer: Medicare Other | Admitting: Family Medicine

## 2017-02-26 ENCOUNTER — Encounter: Payer: Self-pay | Admitting: Cardiovascular Disease

## 2017-02-27 ENCOUNTER — Encounter: Payer: Self-pay | Admitting: Family Medicine

## 2017-03-06 ENCOUNTER — Telehealth: Payer: Self-pay | Admitting: Emergency Medicine

## 2017-03-06 NOTE — Telephone Encounter (Signed)
Patient tried generic and states internist couldn't get patient levels up therefore brand was prescribe, please advise

## 2017-03-06 NOTE — Telephone Encounter (Signed)
Received refill request for Synthroid 75 MCG tablet from [facility]. Per pharmacy Prior Josem Kaufmann is required. I have already called  [phone] to get the prior auth started. Just need to find out if the pt has ever tried to generic before. Prior Josem Kaufmann will only fill the brand if there is some reason the pt can not take the generic.   Tried to contact pt to find out if she's ever had the generic of Synthroid before. If so find out the reason why the generic did not work for her. If she's never tried the generic I was informed that it is possible insurance will deny the brand name if there is no reason why the pt can't use the generic instead.  Call [phone] to finish prior auth. Case ID: [id number]

## 2017-03-07 NOTE — Telephone Encounter (Signed)
Called back to finish prior auth with information provided by pt. Prior Latoya [name] has been Approved. Informed that pt will be notified by phone call of the approval.

## 2017-03-14 ENCOUNTER — Ambulatory Visit (INDEPENDENT_AMBULATORY_CARE_PROVIDER_SITE_OTHER): Payer: Medicare Other | Admitting: Cardiovascular Disease

## 2017-03-14 ENCOUNTER — Encounter: Payer: Self-pay | Admitting: Cardiovascular Disease

## 2017-03-14 VITALS — BP 130/80 | HR 74 | Resp 12 | Ht 62.0 in | Wt 175.4 lb

## 2017-03-14 DIAGNOSIS — I6523 Occlusion and stenosis of bilateral carotid arteries: Secondary | ICD-10-CM

## 2017-03-14 DIAGNOSIS — I10 Essential (primary) hypertension: Secondary | ICD-10-CM | POA: Diagnosis not present

## 2017-03-14 DIAGNOSIS — I480 Paroxysmal atrial fibrillation: Secondary | ICD-10-CM

## 2017-03-14 DIAGNOSIS — I2510 Atherosclerotic heart disease of native coronary artery without angina pectoris: Secondary | ICD-10-CM

## 2017-03-14 NOTE — Patient Instructions (Signed)
Medication Instructions Your physician recommends that you continue on your current medications as directed. Please refer to the Current Medication list given to you today.   Labwork: none  Testing/Procedures: Your physician has requested that you have a carotid duplex. This test is an ultrasound of the carotid arteries in your neck. It looks at blood flow through these arteries that supply the brain with blood. Allow one hour for this exam. There are no restrictions or special instructions.    Follow-Up: Your physician recommends that you schedule a follow-up appointment in: 6 months. Please call our office in about 3 months to schedule this appointment.     Any Other Special Instructions Will Be Listed Below (If Applicable).     If you need a refill on your cardiac medications before your next appointment, please call your pharmacy.

## 2017-03-14 NOTE — Progress Notes (Signed)
Chief Complaint  Patient presents with  . Follow-up    CAD    History of Present Illness: 71 yo female with history of PAF, HTN, hyperlipidemia, anti-phospholipid antibody syndrome, GERD, asthma, CAD here today for cardiac follow up. Cardiac cath in 2003 with moderate CAD (30% ostial LM stenosis, 50% mid LAD, 80% moderate sized Diagonal, 80% Circumflex). She has had no cath since then. She has chronic back pain. She has a hypercoagulable state and is on Xarelto. Most recent stress test October 2013 with no ischemia, normal LV function.  Last echo August 2017 with LVEF=50%, moderate mitral regurgitation. Carotid artery dopplers April 2016 with stable 40-59% bilateral stenosis. She was admitted to [facility] August 2017 with sepsis, ascending cholangitis complicated by respiratory failure, renal failure, CHF.  She underwent lap cholecystectomy 10/04/16.   She is here today for follow up. The patient denies any chest pain, dyspnea, palpitations, lower extremity edema, orthopnea, PND, dizziness, near syncope or syncope.   Primary Care Physician: Darreld Mclean, MD  Past Medical History:  Diagnosis Date  . Anemia, iron deficiency   . Antiphospholipid syndrome (HCC)    with hypercoagulable state  . Asthma    extrinsic; moderate, persistant, nml spirometry 2010, nml CXR 1/08  . Bladder troubles    REPORTS INFECTIONS ON OCCASION DUE TO URETHRA MEATUS STRICTURE AT BIRTH   . Blood dyscrasia    antiphospholipid disorder  . CAD (coronary artery disease)    50% mid LAD, 80% ostial D1 and moderate 80% mid circ by vath 2003  . Cancer ([place])    basal cell removed fr. L arm   . Complication of anesthesia    cardiac arrest- in OR, at age 71 (43)y.o. during Scalenotomy in her early 20's  . Coronary heart disease   . Drug allergy    heparin/lovenox  . DVT (deep venous thrombosis) ([place])   . Erosive gastritis 1994  . Family history of adverse reaction to anesthesia    some family members  have had trouble waking up  . GERD (gastroesophageal reflux disease)   . H/O hiatal hernia   . Heart murmur   . HLD (hyperlipidemia)   . HTN (hypertension)    Pt states her high blood pressure readings are due tot he method of measurementsMcAlhaney at [employer], manages pt.  LOV 03/2014  . Hypothyroidism   . Liver spot    cyst- - no biopsy, but told that its benign   . PAF (paroxysmal atrial fibrillation) (HCC)    not on coumadin therapy  . Pulmonary embolism ([place])    related to back surgery with prior coumadin use, now off  . Spondylosis, lumbosacral    ARTHRITIS- OA     Past Surgical History:  Procedure Laterality Date  . ABDOMINAL HYSTERECTOMY     partial abdominal -1998  . BACK SURGERY     x13 cervical and lumbar thoracic spine surgery  . BREAST ENHANCEMENT SURGERY    . BREAST SURGERY     all benign cysts x3   . CARDIAC CATHETERIZATION     2005  . CHOLECYSTECTOMY N/A 10/04/2016   Procedure: LAPAROSCOPIC CHOLECYSTECTOMY;  Surgeon: Erroll Luna, MD;  Location: [facility] OR;  Service: General;  Laterality: N/A;  . cleft lip and palate repair     71 yo   . hysterectomy    . IR GENERIC HISTORICAL  06/04/2016   IR PERC CHOLECYSTOSTOMY 06/04/2016 Greggory Keen, MD [facility]INTER[facility] RAD  . IR GENERIC HISTORICAL  08/06/2016  IR CHOLANGIOGRAM EXISTING TUBE 08/06/2016 Aletta Edouard, MD [facility]  . IR GENERIC HISTORICAL  07/17/2016   IR RADIOLOGIST EVAL & MGMT 07/17/2016 [facility] INTER[facility] RAD  . JOINT REPLACEMENT    . POSTERIOR CERVICAL FUSION/FORAMINOTOMY Right 08/31/2014   Procedure: Right cervical six-seven foraminotomy, Cervical five-Thoracic one fusion, Cervical six-seven lateral mass screws;  Surgeon: Erline Levine, MD;  Location: [facility];  Service: Neurosurgery;  Laterality: Right;  . spina bifida repair     71 yo   . TONSILLECTOMY    . TOTAL KNEE ARTHROPLASTY     left    Current Outpatient Prescriptions  Medication Sig Dispense Refill  . albuterol (PROVENTIL HFA;VENTOLIN HFA) 108  (90 BASE) MCG/ACT inhaler Inhale 2 puffs into the lungs every 6 (six) hours as needed for wheezing or shortness of breath.     Marland Kitchen aspirin 81 MG EC tablet Take 81 mg by mouth daily.     Marland Kitchen atorvastatin (LIPITOR) 40 MG tablet Take 1 tablet (40 mg total) by mouth daily. 90 tablet 3  . azelastine (ASTELIN) 137 MCG/SPRAY nasal spray Place 1 spray into the nose 2 (two) times daily.     . beclomethasone (QVAR) 80 MCG/ACT inhaler Inhale 2 puffs into the lungs 3 (three) times daily.    . Calcium Carb-Cholecalciferol (CALCIUM 500 +D) 500-400 MG-UNIT TABS Take 1 tablet by mouth 2 (two) times daily.    . cyclobenzaprine (FLEXERIL) 10 MG tablet Take 10 mg by mouth 3 (three) times daily as needed for muscle spasms.    Marland Kitchen docusate sodium (COLACE) 100 MG capsule Take 200 mg by mouth 2 (two) times daily.     Marland Kitchen EPINEPHrine 0.3 mg/0.3 mL IJ SOAJ injection Inject 0.3 mg into the muscle once. As needed for anaphylaxis    . esomeprazole (NEXIUM) 40 MG capsule Take 40 mg by mouth at bedtime.     . fentaNYL (DURAGESIC - DOSED MCG/HR) 50 MCG/HR Place 50 mcg onto the skin every 3 (three) days. IN CONJUNCTION WITH ONE 12 MCG PATCH TO EQUAL A TOTAL OF 62 MICROGRAMS    . gabapentin (NEURONTIN) 600 MG tablet Take 300-600 mg by mouth at bedtime.     . Glucosamine-Chondroitin 750-600 MG TABS Take 2 tablets by mouth 2 (two) times daily.     Marland Kitchen HYDROmorphone (DILAUDID) 4 MG tablet Take 4-8 mg by mouth every 4 (four) hours as needed (pain).    . iron polysaccharides (NIFEREX) 150 MG capsule Take 150 mg by mouth daily.    Marland Kitchen levothyroxine (SYNTHROID, LEVOTHROID) 75 MCG tablet Take 1 tablet (75 mcg total) by mouth daily before breakfast. 90 tablet 3  . lidocaine (LIDODERM) 5 % Place 1 patch onto the skin daily. Remove & Discard patch within 12 hours or as directed by MD    . Loratadine-Pseudoephedrine (CLARITIN-D 12 HOUR PO) Take 1 tablet by mouth 2 (two) times daily.     . Melatonin 5 MG TABS Take 2.5-5 mg by mouth at bedtime as needed.     . metaxalone (SKELAXIN) 800 MG tablet Take 800 mg by mouth every 8 (eight) hours as needed. For muscle pain    . metoprolol (LOPRESSOR) 50 MG tablet TAKE 1 TABLET TWICE DAILY. 180 tablet 0  . Multiple Vitamin (MULTIVITAMIN WITH MINERALS) TABS Take 1 tablet by mouth daily.    Marland Kitchen NITROSTAT 0.4 MG SL tablet DISSOLVE 1 TABLET UNDER TONGUE AS NEEDED FOR CHEST PAIN,MAY REPEAT IN5 MINUTES FOR 2 DOSES. 25 tablet 0  . nystatin (MYCOSTATIN/NYSTOP) powder  Apply topically 4 (four) times daily. Use as needed for rash 45 g 2  . oxyCODONE-acetaminophen (ROXICET) 5-325 MG tablet Take 1-2 tablets by mouth every 4 (four) hours as needed. 30 tablet 0  . Respiratory Therapy Supplies (FLUTTER) DEVI Use as directed 1 each 0  . rivaroxaban (XARELTO) 20 MG TABS tablet Take 1 tablet (20 mg total) by mouth daily with supper. 90 tablet 1  . triamcinolone (NASACORT) 55 MCG/ACT nasal inhaler Place 1-2 sprays into the nose at bedtime.      No current facility-administered medications for this visit.     Allergies  Allergen Reactions  . Chlorhexidine Anaphylaxis and Itching    Sensitive to dye in Betadine & Chlorohexadine   . Enoxaparin Sodium Anaphylaxis  . Heparin Anaphylaxis  . Hornet Venom Anaphylaxis    European Hornets  . Other Anaphylaxis and Itching    Sensitive to dye in Betadine & Chlorohexadine   . Pork-Derived Products Anaphylaxis  . Povidone-Iodine Itching and Rash    NOTE: Patient has had anaphylactic reaction to Betadine due to dyes in product. Sensitivity-" but if its wiped off she is able to tolerate betadine"   . Indomethacin Rash  . Lactose Intolerance (Gi) Diarrhea and Nausea And Vomiting    Can tolerate most [place] cheeses (Takes Lactaid)  . Phenylpropanolamine Hypertension    Social History   Social History  . Marital status: Married    Spouse name: N/A  . Number of children: N/A  . Years of education: N/A   Occupational History  . Not on file.   Social History Main Topics  .  Smoking status: Never Smoker  . Smokeless tobacco: Never Used     Comment: no smoking   . Alcohol use No  . Drug use: No  . Sexual activity: Not on file   Other Topics Concern  . Not on file   Social History Narrative   Lives in [place] with husband; has had miscarriages but no children.    Disabled but exercises nearly every day (can only exericse in water - aerobics)   Takes opioids for pain relief; takes no herbal medications; has a heart healthy diet.     Family History  Problem Relation Age of Onset  . Heart disease Father        MI   . Heart disease Sister     Review of Systems:  As stated in the HPI and otherwise negative.   BP 130/80   Pulse 74   Resp 12   Ht 5\' 2"  (1.575 m)   Wt 175 lb 6.4 oz (79.6 kg)   SpO2 99%   BMI 32.08 kg/m   Physical Examination: General: Well developed, well nourished, NAD  HEENT: OP clear, mucus membranes moist  SKIN: warm, dry. No rashes. Neuro: No focal deficits  Musculoskeletal: Muscle strength 5/5 all ext  Psychiatric: Mood and affect normal  Neck: No JVD, no carotid bruits, no thyromegaly, no lymphadenopathy.  Lungs:Clear bilaterally, no wheezes, rhonci, crackles Cardiovascular: Regular rate and rhythm. No murmurs, gallops or rubs. Abdomen:Soft. Bowel sounds present. Non-tender.  Extremities: No lower extremity edema. Pulses are 2 + in the bilateral DP/PT.  Echo August 2017: Left ventricle: The cavity size was normal. Wall thickness was at   the upper limits of normal. The estimated ejection fraction was   50%. Wall motion was normal; there were no regional wall motion   abnormalities. Features are consistent with a pseudonormal left   ventricular filling pattern, with concomitant  abnormal relaxation   and increased filling pressure (grade 2 diastolic dysfunction). - Mitral valve: Mildly thickened leaflets . There was moderate   regurgitation directed posteriorly. - Left atrium: The atrium was moderately dilated. - Tricuspid  valve: There was trivial regurgitation. - Pulmonary arteries: Systolic pressure could not be accurately   estimated. - Pericardium, extracardiac: There was no pericardial effusion. Impressions: - Upper normal wall thickness with LVEF approximately 50%. Grade 2   diastolic dysfunction with increased LV filling pressure.  Carotid artery dopplers April 2016: Moderate bilateral stenosis, 40-59% stenosis bilateral ICA  Stress myoview: 08/13/12: Stress Procedure: The patient received IV Lexiscan 0.4 mg over 15-seconds. Technetium 26m Sestamibi injected at 30-seconds. There were significant changes with Lexiscan. The patient complained of chest pain with Lexiscan. Quantitative spect images were obtained after a 45 minute delay.  The patient complained of persistent headache with flushing of face and neck after Lexiscan that was relieved quickly after Aminophylline 75 mg IVP given.  Stress ECG: No significant change from baseline ECG  QPS  Raw Data Images: Patient motion noted.  Stress Images: lateral wall not interpretable due to bowel artifact  Rest Images: lateral wall not interpretable due to bowel artifact  Subtraction (SDS): Normal  Transient Ischemic Dilatation (Normal <1.22): 1.03  Lung/Heart Ratio (Normal <0.45): 0.45  Quantitative Gated Spect Images  QGS EDV: 95 ml  QGS ESV: 50 ml  Impression  Exercise Capacity: Lexiscan with no exercise.  BP Response: Normal blood pressure response.  Clinical Symptoms: Headache requiring amynophylline  ECG Impression: No significant ST segment change suggestive of ischemia.  Comparison with Prior Nuclear Study: No images to compare  Overall Impression: Apical thinning no obvious ischemia Lateral wall not interpretable due to a large area of bowel artifact on both resting and stress images  LV Ejection Fraction: 47%. LV Wall Motion: Apical hypokinesis  Cardiac cath 09/22/02: 1. Ventriculography was performed in the RAO projection. Overall  ejection  fraction was 70%. No segmental wall motion abnormalities were  identified.  2. The left main coronary artery has, what appears to be, about 30%  narrowing at the ostium. In most views there does appear to be a patent  ostium; however, some tapered narrowing cannot be excluded.  3. The LAD proper has about 40-50% narrowing, at most, in the mid portion  beyond the diagonal takeoff. The vessel is calcified. The diagonal  takeoff itself has a long 80% stenosis at the ostium extending out into  the mid portion of the diagonal. The distal diagonal does appear to be  suitable for grafting.  4. The large circumflex has about 70-80% focal narrowing prior to the  bifurcation of the marginal.  5. The right coronary artery has some mild luminal irregularities but no  high-grade stenoses.  IMPRESSION:  1. Coronary artery disease with significant involvement and circumflex  involvement.  2. Multiple medical issues, as described above.  EKG:  EKG is not ordered today. The ekg ordered today demonstrates   Recent Labs: 06/21/2016: Magnesium 1.8 11/21/2016: ALT 17; BUN 15; Creatinine, Ser 0.76; Potassium 4.4; Sodium 139; TSH 4.10 01/28/2017: Hemoglobin 13.8; Platelets 260.0   Lipid Panel Lipid Panel     Component Value Date/Time   CHOL 170 12/21/2015   TRIG 211 (H) 06/07/2016 0415   HDL 59 12/21/2015   CHOLHDL 4 08/05/2009 0935   VLDL 51.8 (H) 08/05/2009 0935   LDLDIRECT 98.6 08/05/2009 0935     Wt Readings from Last 3 Encounters:  03/14/17 175 lb 6.4  oz (79.6 kg)  01/28/17 168 lb 6.4 oz (76.4 kg)  11/21/16 166 lb (75.3 kg)     Other studies Reviewed: Additional studies/ records that were reviewed today include: . Review of the above records demonstrates:    Assessment and Plan:   1. CAD without angina: No chest pain suggestive of angina. LV function normal by echo 2017. Moderate CAD by cath in 2003. Will continue ASA, statin, beta blocker.   2. Atrial fib, paroxysmal:  Sinus today. Continue beta blocker and Xarelto.    3. HYPERTENSION: BP controlled. No changes  4. CAROTID ARTERY DISEASE: Moderate bilateral carotid disease by dopplers 2016. Repeat now.   5. Hyperlipidemia: Lipids followed in primary care per pt. Continue statin.   Current medicines are reviewed at length with the patient today.  The patient does not have concerns regarding medicines.  The following changes have been made:  no change  Labs/ tests ordered today include:  No orders of the defined types were placed in this encounter.  Disposition:   FU with me in 6 months  Signed, Lauree Chandler, MD 03/14/2017 3:18 PM    [facility] [address], [place], [place]  [address] Phone: [phone]; Fax: [phone]

## 2017-03-20 DIAGNOSIS — M963 Postlaminectomy kyphosis: Secondary | ICD-10-CM | POA: Diagnosis not present

## 2017-03-20 DIAGNOSIS — Z79891 Long term (current) use of opiate analgesic: Secondary | ICD-10-CM | POA: Diagnosis not present

## 2017-03-20 DIAGNOSIS — G894 Chronic pain syndrome: Secondary | ICD-10-CM | POA: Diagnosis not present

## 2017-03-20 DIAGNOSIS — M4722 Other spondylosis with radiculopathy, cervical region: Secondary | ICD-10-CM | POA: Diagnosis not present

## 2017-03-25 ENCOUNTER — Ambulatory Visit (HOSPITAL_COMMUNITY)
Admission: RE | Admit: 2017-03-25 | Discharge: 2017-03-25 | Disposition: A | Discharge status: Home / Self Care | Payer: Medicare Other | Source: Ambulatory Visit | Attending: Cardiovascular Disease | Admitting: Cardiovascular Disease

## 2017-03-25 DIAGNOSIS — I2510 Atherosclerotic heart disease of native coronary artery without angina pectoris: Secondary | ICD-10-CM | POA: Diagnosis not present

## 2017-03-25 DIAGNOSIS — I739 Peripheral vascular disease, unspecified: Secondary | ICD-10-CM | POA: Diagnosis not present

## 2017-03-25 DIAGNOSIS — Z87891 Personal history of nicotine dependence: Secondary | ICD-10-CM | POA: Diagnosis not present

## 2017-03-25 DIAGNOSIS — I6523 Occlusion and stenosis of bilateral carotid arteries: Secondary | ICD-10-CM | POA: Diagnosis not present

## 2017-03-25 DIAGNOSIS — E785 Hyperlipidemia, unspecified: Secondary | ICD-10-CM | POA: Diagnosis not present

## 2017-03-25 DIAGNOSIS — I10 Essential (primary) hypertension: Secondary | ICD-10-CM | POA: Insufficient documentation

## 2017-04-01 DIAGNOSIS — L570 Actinic keratosis: Secondary | ICD-10-CM | POA: Diagnosis not present

## 2017-04-01 DIAGNOSIS — L820 Inflamed seborrheic keratosis: Secondary | ICD-10-CM | POA: Diagnosis not present

## 2017-04-01 DIAGNOSIS — Z85828 Personal history of other malignant neoplasm of skin: Secondary | ICD-10-CM | POA: Diagnosis not present

## 2017-04-06 ENCOUNTER — Other Ambulatory Visit: Payer: Self-pay | Admitting: Nurse Practitioner

## 2017-04-10 DIAGNOSIS — Z79891 Long term (current) use of opiate analgesic: Secondary | ICD-10-CM | POA: Diagnosis not present

## 2017-04-10 DIAGNOSIS — M4722 Other spondylosis with radiculopathy, cervical region: Secondary | ICD-10-CM | POA: Diagnosis not present

## 2017-04-10 DIAGNOSIS — M963 Postlaminectomy kyphosis: Secondary | ICD-10-CM | POA: Diagnosis not present

## 2017-04-10 DIAGNOSIS — G894 Chronic pain syndrome: Secondary | ICD-10-CM | POA: Diagnosis not present

## 2017-04-20 ENCOUNTER — Other Ambulatory Visit: Payer: Self-pay | Admitting: Cardiovascular Disease

## 2017-04-28 NOTE — Progress Notes (Addendum)
[facility] [place], [place], [place] [address] [phone]  Date:  04/29/2017   Name:  Latoya [name]   DOB:  09-29-1946   MRN:  [id number]  PCP:  Darreld Mclean, MD    Chief Complaint: Follow-up   History of Present Illness:  Latoya [name] is a 71 y.o. very pleasant female patient who presents with the following:  Here today for a follow-up visit I last saw her in April of this year She saw Dr. Angelena Form in May-  71 yo female with history of PAF, HTN, hyperlipidemia, anti-phospholipid antibody syndrome, GERD, asthma, CAD here today for cardiac follow up. Cardiac cath in 2003 with moderate CAD (30% ostial LM stenosis, 50% mid LAD, 80% moderate sized Diagonal, 80% Circumflex). She has had no cath since then. She has chronic back pain. She has a hypercoagulable state and is on Xarelto. Most recent stress test October 2013 with no ischemia, normal LV function.  Last echo August 2017 with LVEF=50%, moderate mitral regurgitation. Carotid artery dopplers April 2016 with stable 40-59% bilateral stenosis. She was admitted to [facility] August 2017 with sepsis, ascending cholangitis complicated by respiratory failure, renal failure, CHF.  She underwent lap cholecystectomy 10/04/16.   She is getting her strength back and is back in her garden working She had her carotid dopplers just recently which were stable  She is decreasing her fentanyl dose; they are down to 37.5 mcg, was at 62 last visit.  This is managed by Dr. Myles Rosenthal.   He also prescribes her po Dilaudid which she uses PRN  Lab Results  Component Value Date   HGBA1C 5.7 01/28/2017   She does have pre-diabetes- recent A1c as above She uses melatonin and gabapentin for sleep.  These are working well for her She is ok on refills today Her allergies have not bothered her really this year   She is on xarelto for her hypercoagulable state She did have atrial fib  in the past but not recently She does not need her albuterol at all right now Uses qvar as needed- more so when her allergies are worse    BP Readings from Last 3 Encounters:  04/29/17 (!) 154/88  03/14/17 130/80  01/28/17 (!) 154/92   At home her BP tends to run 110/ 78-80 She checks it about once a week Wt Readings from Last 3 Encounters:  04/29/17 177 lb 14.1 oz (80.7 kg)  03/14/17 175 lb 6.4 oz (79.6 kg)  01/28/17 168 lb 6.4 oz (76.4 kg)   She stopped using iron at this time as her hg is back to normal She did have a GI bleed we think in the past due to NSAIDs She did have erosions in her stomach and duodenum in approx 2010/ 2011  She does take a joint supplement which she feels does help her She is on synthroid brand name 64   Patient Active Problem List   Diagnosis Date Noted  . Pre-diabetes 09/25/2016  . Leukocytosis 08/04/2016  . Nausea with vomiting 08/04/2016  . Hypothyroidism 08/04/2016  . Anemia of chronic disease 08/04/2016  . Cholangitis   . Gastroparesis   . Enterococcal bacteremia 06/06/2016  . Bacteremia due to Klebsiella pneumoniae 06/06/2016  . Bacteremia due to Escherichia coli 06/06/2016  . Ascending cholangitis 06/04/2016  . Hemangioma of liver 06/04/2016  . Cervical pseudoarthrosis ([place]) 08/31/2014  . Anaphylaxis, mild, due to wasp envenomation 04/12/2013  .  Chest pain, atypical 04/11/2013  . PAD (peripheral artery disease) ([place]) 06/20/2011  . ANEMIA, IRON DEFICIENCY, MICROCYTIC 06/01/2010  . Primary hypercoagulable state ([place]) 06/01/2010  . BACK PAIN, CHRONIC 06/01/2010  . ABDOMINAL PAIN -GENERALIZED 06/01/2010  . CAROTID ARTERY DISEASE 05/25/2010  . CAD, NATIVE VESSEL 11/15/2009  . MURMUR 11/15/2009  . CAROTID BRUIT, LEFT 11/15/2009  . Essential hypertension 03/18/2009  . History of pulmonary embolism 03/18/2009  . PAROXYSMAL ATRIAL FIBRILLATION 03/18/2009  . DVT 03/18/2009  . Asthma 03/18/2009  . GERD 03/18/2009  . SPONDYLOSIS, LUMBAR  03/18/2009  . HYPERLIPIDEMIA 11/29/2008  . CORONARY HEART DISEASE 11/29/2008    Past Medical History:  Diagnosis Date  . Anemia, iron deficiency   . Antiphospholipid syndrome (HCC)    with hypercoagulable state  . Asthma    extrinsic; moderate, persistant, nml spirometry 2010, nml CXR 1/08  . Bladder troubles    REPORTS INFECTIONS ON OCCASION DUE TO URETHRA MEATUS STRICTURE AT BIRTH   . Blood dyscrasia    antiphospholipid disorder  . CAD (coronary artery disease)    50% mid LAD, 80% ostial D1 and moderate 80% mid circ by vath 2003  . Cancer ([place])    basal cell removed fr. L arm   . Complication of anesthesia    cardiac arrest- in OR, at age 61 (80)y.o. during Scalenotomy in her early 20's  . Coronary heart disease   . Drug allergy    heparin/lovenox  . DVT (deep venous thrombosis) ([place])   . Erosive gastritis 1994  . Family history of adverse reaction to anesthesia    some family members have had trouble waking up  . GERD (gastroesophageal reflux disease)   . H/O hiatal hernia   . Heart murmur   . HLD (hyperlipidemia)   . HTN (hypertension)    Pt states her high blood pressure readings are due tot he method of measurementsMcAlhaney at [facility], manages pt.  LOV 03/2014  . Hypothyroidism   . Liver spot    cyst- - no biopsy, but told that its benign   . PAF (paroxysmal atrial fibrillation) (HCC)    not on coumadin therapy  . Pulmonary embolism ([place])    related to back surgery with prior coumadin use, now off  . Spondylosis, lumbosacral    ARTHRITIS- OA     Past Surgical History:  Procedure Laterality Date  . ABDOMINAL HYSTERECTOMY     partial abdominal -1998  . BACK SURGERY     x13 cervical and lumbar thoracic spine surgery  . BREAST ENHANCEMENT SURGERY    . BREAST SURGERY     all benign cysts x3   . CARDIAC CATHETERIZATION     2005  . CHOLECYSTECTOMY N/A 10/04/2016   Procedure: LAPAROSCOPIC CHOLECYSTECTOMY;  Surgeon: Erroll Luna, MD;  Location: [facility] OR;   Service: General;  Laterality: N/A;  . cleft lip and palate repair     71 yo   . hysterectomy    . IR GENERIC HISTORICAL  06/04/2016   IR PERC CHOLECYSTOSTOMY 06/04/2016 Greggory Keen, MD [facility]INTER[facility]  . IR GENERIC HISTORICAL  08/06/2016   IR CHOLANGIOGRAM EXISTING TUBE 08/06/2016 Aletta Edouard, MD [facility]  . IR GENERIC HISTORICAL  07/17/2016   IR RADIOLOGIST EVAL & MGMT 07/17/2016 [facility] INTERV RAD  . JOINT REPLACEMENT    . POSTERIOR CERVICAL FUSION/FORAMINOTOMY Right 08/31/2014   Procedure: Right cervical six-seven foraminotomy, Cervical five-Thoracic one fusion, Cervical six-seven lateral mass screws;  Surgeon: Erline Levine, MD;  Location: [facility];  Service: Neurosurgery;  Laterality: Right;  . spina bifida repair     71 yo   . TONSILLECTOMY    . TOTAL KNEE ARTHROPLASTY     left    Social History  Substance Use Topics  . Smoking status: Never Smoker  . Smokeless tobacco: Never Used     Comment: no smoking   . Alcohol use No    Family History  Problem Relation Age of Onset  . Heart disease Father        MI   . Heart disease Sister     Allergies  Allergen Reactions  . Chlorhexidine Anaphylaxis and Itching    Sensitive to dye in Betadine & Chlorohexadine   . Enoxaparin Sodium Anaphylaxis  . Heparin Anaphylaxis  . Hornet Venom Anaphylaxis    European Hornets  . Other Anaphylaxis and Itching    Sensitive to dye in Betadine & Chlorohexadine   . Pork-Derived Products Anaphylaxis  . Povidone-Iodine Itching and Rash    NOTE: Patient has had anaphylactic reaction to Betadine due to dyes in product. Sensitivity-" but if its wiped off she is able to tolerate betadine"   . Indomethacin Rash  . Lactose Intolerance (Gi) Diarrhea and Nausea And Vomiting    Can tolerate most [place] cheeses (Takes Lactaid)  . Phenylpropanolamine Hypertension    Medication list has been reviewed and updated.  Current Outpatient Prescriptions on File Prior to Visit  Medication Sig  Dispense Refill  . albuterol (PROVENTIL HFA;VENTOLIN HFA) 108 (90 BASE) MCG/ACT inhaler Inhale 2 puffs into the lungs every 6 (six) hours as needed for wheezing or shortness of breath.     Marland Kitchen aspirin 81 MG EC tablet Take 81 mg by mouth daily.     Marland Kitchen atorvastatin (LIPITOR) 40 MG tablet Take 1 tablet (40 mg total) by mouth daily. 90 tablet 3  . azelastine (ASTELIN) 137 MCG/SPRAY nasal spray Place 1 spray into the nose 2 (two) times daily.     . beclomethasone (QVAR) 80 MCG/ACT inhaler Inhale 2 puffs into the lungs 3 (three) times daily.    . Calcium Carb-Cholecalciferol (CALCIUM 500 +D) 500-400 MG-UNIT TABS Take 1 tablet by mouth 2 (two) times daily.    . cyclobenzaprine (FLEXERIL) 10 MG tablet Take 10 mg by mouth 3 (three) times daily as needed for muscle spasms.    Marland Kitchen docusate sodium (COLACE) 100 MG capsule Take 200 mg by mouth 2 (two) times daily.     Marland Kitchen EPINEPHrine 0.3 mg/0.3 mL IJ SOAJ injection Inject 0.3 mg into the muscle once. As needed for anaphylaxis    . esomeprazole (NEXIUM) 40 MG capsule Take 40 mg by mouth at bedtime.     . fentaNYL (DURAGESIC - DOSED MCG/HR) 50 MCG/HR Place 50 mcg onto the skin every 3 (three) days. IN CONJUNCTION WITH ONE 12 MCG PATCH TO EQUAL A TOTAL OF 62 MICROGRAMS    . gabapentin (NEURONTIN) 600 MG tablet Take 300-600 mg by mouth at bedtime.     . Glucosamine-Chondroitin 750-600 MG TABS Take 2 tablets by mouth 2 (two) times daily.     Marland Kitchen levothyroxine (SYNTHROID, LEVOTHROID) 75 MCG tablet Take 1 tablet (75 mcg total) by mouth daily before breakfast. 90 tablet 3  . Loratadine-Pseudoephedrine (CLARITIN-D 12 HOUR PO) Take 1 tablet by mouth 2 (two) times daily.     . Melatonin 5 MG TABS Take 2.5-5 mg by mouth at bedtime as needed.    . metaxalone (SKELAXIN) 800 MG tablet Take 800 mg by mouth  every 8 (eight) hours as needed. For muscle pain    . metoprolol tartrate (LOPRESSOR) 50 MG tablet TAKE 1 TABLET TWICE DAILY. 180 tablet 0  . Multiple Vitamin (MULTIVITAMIN WITH  MINERALS) TABS Take 1 tablet by mouth daily.    . nitroGLYCERIN (NITROSTAT) 0.4 MG SL tablet DISSOLVE 1 TABLET UNDER TONGUE AS NEEDED FOR CHEST PAIN,MAY REPEAT IN5 MINUTES FOR 2 DOSES. 25 tablet 6  . Respiratory Therapy Supplies (FLUTTER) DEVI Use as directed 1 each 0  . rivaroxaban (XARELTO) 20 MG TABS tablet Take 1 tablet (20 mg total) by mouth daily with supper. 90 tablet 1  . triamcinolone (NASACORT) 55 MCG/ACT nasal inhaler Place 1-2 sprays into the nose at bedtime.      No current facility-administered medications on file prior to visit.     Review of Systems:  As per HPI- otherwise negative.   Physical Examination: Vitals:   04/29/17 1540  BP: (!) 154/88  Pulse: 79  Temp: 98.2 F (36.8 C)   Vitals:   04/29/17 1540  Weight: 177 lb 14.1 oz (80.7 kg)  Height: 5\' 2"  (1.575 m)   Body mass index is 32.53 kg/m. Ideal Body Weight: Weight in (lb) to have BMI = 25: 136.4  GEN: WDWN, NAD, Non-toxic, A & O x 3 HEENT: Atraumatic, Normocephalic. Neck supple. No masses, No LAD. Ears and Nose: No external deformity. CV: RRR, No M/G/R. No JVD. No thrill. No extra heart sounds. PULM: CTA B, no wheezes, crackles, rhonchi. No retractions. No resp. distress. No accessory muscle use. ABD: S, NT, ND, +BS. No rebound. No HSM. EXTR: No c/c/e NEURO Normal gait for pt- she uses a walker and has significant kyphosis PSYCH: Normally interactive. Conversant. Not depressed or anxious appearing.  Calm demeanor.    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

## 2017-04-29 ENCOUNTER — Ambulatory Visit (INDEPENDENT_AMBULATORY_CARE_PROVIDER_SITE_OTHER): Payer: Medicare Other | Admitting: Family Medicine

## 2017-04-29 VITALS — BP 154/88 | HR 79 | Temp 98.2°F | Ht 62.0 in | Wt 177.9 lb

## 2017-04-29 DIAGNOSIS — R7303 Prediabetes: Secondary | ICD-10-CM | POA: Diagnosis not present

## 2017-04-29 DIAGNOSIS — I6523 Occlusion and stenosis of bilateral carotid arteries: Secondary | ICD-10-CM

## 2017-04-29 DIAGNOSIS — I10 Essential (primary) hypertension: Secondary | ICD-10-CM | POA: Diagnosis not present

## 2017-04-29 DIAGNOSIS — E039 Hypothyroidism, unspecified: Secondary | ICD-10-CM

## 2017-04-29 DIAGNOSIS — D6859 Other primary thrombophilia: Secondary | ICD-10-CM

## 2017-04-29 NOTE — Patient Instructions (Signed)
It was good to see you today- take care and I will be in touch with your results asap You may want to have the shingles vaccine series- Shinrix- which is 2 doses. You can have this done at your [facility] if you like

## 2017-04-30 ENCOUNTER — Encounter: Payer: Self-pay | Admitting: Family Medicine

## 2017-04-30 LAB — HEMOGLOBIN A1C: HEMOGLOBIN A1C: 5.5 % (ref 4.6–6.5)

## 2017-04-30 LAB — TSH: TSH: 1.62 u[IU]/mL (ref 0.35–4.50)

## 2017-05-02 DIAGNOSIS — M4722 Other spondylosis with radiculopathy, cervical region: Secondary | ICD-10-CM | POA: Diagnosis not present

## 2017-05-02 DIAGNOSIS — M963 Postlaminectomy kyphosis: Secondary | ICD-10-CM | POA: Diagnosis not present

## 2017-05-02 DIAGNOSIS — Z79891 Long term (current) use of opiate analgesic: Secondary | ICD-10-CM | POA: Diagnosis not present

## 2017-05-02 DIAGNOSIS — G894 Chronic pain syndrome: Secondary | ICD-10-CM | POA: Diagnosis not present

## 2017-06-03 DIAGNOSIS — M963 Postlaminectomy kyphosis: Secondary | ICD-10-CM | POA: Diagnosis not present

## 2017-06-03 DIAGNOSIS — Z79891 Long term (current) use of opiate analgesic: Secondary | ICD-10-CM | POA: Diagnosis not present

## 2017-06-03 DIAGNOSIS — M4722 Other spondylosis with radiculopathy, cervical region: Secondary | ICD-10-CM | POA: Diagnosis not present

## 2017-06-03 DIAGNOSIS — G894 Chronic pain syndrome: Secondary | ICD-10-CM | POA: Diagnosis not present

## 2017-07-02 DIAGNOSIS — M4722 Other spondylosis with radiculopathy, cervical region: Secondary | ICD-10-CM | POA: Diagnosis not present

## 2017-07-02 DIAGNOSIS — M963 Postlaminectomy kyphosis: Secondary | ICD-10-CM | POA: Diagnosis not present

## 2017-07-02 DIAGNOSIS — Z79891 Long term (current) use of opiate analgesic: Secondary | ICD-10-CM | POA: Diagnosis not present

## 2017-07-02 DIAGNOSIS — G894 Chronic pain syndrome: Secondary | ICD-10-CM | POA: Diagnosis not present

## 2017-07-11 ENCOUNTER — Other Ambulatory Visit: Payer: Self-pay | Admitting: Nurse Practitioner

## 2017-07-30 DIAGNOSIS — M4722 Other spondylosis with radiculopathy, cervical region: Secondary | ICD-10-CM | POA: Diagnosis not present

## 2017-07-30 DIAGNOSIS — G894 Chronic pain syndrome: Secondary | ICD-10-CM | POA: Diagnosis not present

## 2017-07-30 DIAGNOSIS — M963 Postlaminectomy kyphosis: Secondary | ICD-10-CM | POA: Diagnosis not present

## 2017-07-30 DIAGNOSIS — Z79891 Long term (current) use of opiate analgesic: Secondary | ICD-10-CM | POA: Diagnosis not present

## 2017-08-08 ENCOUNTER — Other Ambulatory Visit: Payer: Self-pay | Admitting: Cardiovascular Disease

## 2017-08-08 NOTE — Telephone Encounter (Signed)
Xarelto 20mg  refill request received; pt is 71 yrs old, wt-80.7kg, Crea-0.76 on 11/21/2016, Last seen by Dr. Angelena Form on 03/14/17, CrCl-87.28ml/min; will send in refill request to requested pharmacy.

## 2017-08-22 ENCOUNTER — Ambulatory Visit: Payer: Medicare Other

## 2017-08-22 DIAGNOSIS — Z23 Encounter for immunization: Secondary | ICD-10-CM | POA: Diagnosis not present

## 2017-08-26 ENCOUNTER — Ambulatory Visit: Payer: Medicare Other | Admitting: Physician Assistant

## 2017-08-27 ENCOUNTER — Encounter: Payer: Self-pay | Admitting: Family Medicine

## 2017-08-27 DIAGNOSIS — Z79891 Long term (current) use of opiate analgesic: Secondary | ICD-10-CM | POA: Diagnosis not present

## 2017-08-27 DIAGNOSIS — M963 Postlaminectomy kyphosis: Secondary | ICD-10-CM | POA: Diagnosis not present

## 2017-08-27 DIAGNOSIS — G894 Chronic pain syndrome: Secondary | ICD-10-CM | POA: Diagnosis not present

## 2017-08-27 DIAGNOSIS — M4722 Other spondylosis with radiculopathy, cervical region: Secondary | ICD-10-CM | POA: Diagnosis not present

## 2017-08-27 NOTE — Progress Notes (Signed)
[place] at [facility] [address], [place], [place] [address] [phone]  Date:  08/29/2017   Name:  Latoya [name] [facility]   DOB:  Aug 07, 1946   MRN:  [id number]  PCP:  Darreld Mclean, MD    Chief Complaint: Ear clogged (Pt  thinks right ear feels clogged )   History of Present Illness:  Latoya [name] is a 71 y.o. very pleasant female patient who presents with the following:  Last seen here in July as follows: She was admitted to [facility] August 2017 with sepsis, ascending cholangitis complicated by respiratory failure, renal failure, CHF. She underwent lap cholecystectomy 10/04/16.   She is getting her strength back and is back in her garden working She had her carotid dopplers just recently which were stable  She is decreasing her fentanyl dose; they are down to 37.5 mcg, was at 62 last visit.  This is managed by Dr. Myles Rosenthal.   He also prescribes her po Dilaudid which she uses PRN  RecentLabs       Lab Results  Component Value Date   HGBA1C 5.7 01/28/2017     She does have pre-diabetes- recent A1c as above She uses melatonin and gabapentin for sleep.  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 Pre-diabetes 09/25/2016  . Leukocytosis 08/04/2016  . Nausea with vomiting 08/04/2016  . Hypothyroidism 08/04/2016  . Anemia of chronic disease 08/04/2016  . Cholangitis   . Gastroparesis   . Enterococcal bacteremia 06/06/2016  . Bacteremia due to Klebsiella pneumoniae 06/06/2016  . Bacteremia due to Escherichia coli 06/06/2016  . Ascending cholangitis 06/04/2016  . Hemangioma of liver 06/04/2016  . Cervical pseudoarthrosis ([place]) 08/31/2014  . Anaphylaxis, mild, due to wasp envenomation 04/12/2013  . Chest pain, atypical 04/11/2013  . PAD (peripheral artery disease) ([place]) 06/20/2011  . ANEMIA, IRON DEFICIENCY, MICROCYTIC 06/01/2010  . Primary hypercoagulable state ([place]) 06/01/2010  . BACK PAIN, CHRONIC 06/01/2010  . ABDOMINAL PAIN -GENERALIZED 06/01/2010  . CAROTID ARTERY DISEASE 05/25/2010  . CAD, NATIVE VESSEL 11/15/2009  . MURMUR 11/15/2009  . CAROTID BRUIT, LEFT 11/15/2009  . Essential hypertension 03/18/2009  . History of pulmonary embolism 03/18/2009  . PAROXYSMAL  ATRIAL FIBRILLATION 03/18/2009  . DVT 03/18/2009  . Asthma 03/18/2009  . GERD 03/18/2009  . SPONDYLOSIS, LUMBAR 03/18/2009  . HYPERLIPIDEMIA 11/29/2008  . CORONARY HEART DISEASE 11/29/2008    Past Medical History:  Diagnosis Date  . Anemia, iron deficiency   . Antiphospholipid syndrome (HCC)    with hypercoagulable state  . Asthma    extrinsic; moderate, persistant, nml spirometry 2010, nml CXR 1/08  . Bladder troubles    REPORTS INFECTIONS ON OCCASION DUE TO URETHRA MEATUS STRICTURE AT BIRTH   . Blood dyscrasia    antiphospholipid disorder  . CAD (coronary artery disease)    50% mid LAD, 80% ostial D1  and moderate 80% mid circ by vath 2003  . Cancer ([place])    basal cell removed fr. L arm   . Complication of anesthesia    cardiac arrest- in OR, at age 62 (55)y.o. during Scalenotomy in her early 20's  . Coronary heart disease   . Drug allergy    heparin/lovenox  . DVT (deep venous thrombosis) ([place])   . Erosive gastritis 1994  . Family history of adverse reaction to anesthesia    some family members have had trouble waking up  . GERD (gastroesophageal reflux disease)   . H/O hiatal hernia   . Heart murmur   . HLD (hyperlipidemia)   . HTN (hypertension)    Pt states her high blood pressure readings are due tot he method of measurementsMcAlhaney at [facility] pt.  LOV 03/2014  . Hypothyroidism   . Liver spot    cyst- - no biopsy, but told that its benign   . PAF (paroxysmal atrial fibrillation) (HCC)    not on coumadin therapy  . Pulmonary embolism ([place])    related to back surgery with prior coumadin use, now off  . Spondylosis, lumbosacral    ARTHRITIS- OA     Past Surgical History:  Procedure Laterality Date  . ABDOMINAL HYSTERECTOMY     partial abdominal -1998  . BACK SURGERY     x13 cervical and lumbar thoracic spine surgery  . BREAST ENHANCEMENT SURGERY    . BREAST SURGERY     all benign cysts x3   . CARDIAC CATHETERIZATION     2005  . cleft lip and palate repair     70 yo   . hysterectomy    . IR GENERIC HISTORICAL  06/04/2016   IR PERC CHOLECYSTOSTOMY 06/04/2016 Greggory Keen, MD [facility]  . IR GENERIC HISTORICAL  08/06/2016   IR CHOLANGIOGRAM EXISTING TUBE 08/06/2016 Aletta Edouard, MD [facility]  . IR GENERIC HISTORICAL  07/17/2016   IR RADIOLOGIST EVAL & MGMT 07/17/2016 [facility]  . JOINT REPLACEMENT    . spina bifida repair     71 yo   . TONSILLECTOMY    . TOTAL KNEE ARTHROPLASTY     left    Social History   Tobacco Use  . Smoking status: Never Smoker  . Smokeless tobacco: Never Used  . Tobacco comment: no smoking   Substance Use  Topics  . Alcohol use: No  . Drug use: No    Family History  Problem Relation Age of Onset  . Heart disease Father        MI   . Heart disease Sister     Allergies  Allergen Reactions  . Chlorhexidine Anaphylaxis and Itching    Sensitive to dye in Betadine & Chlorohexadine   . Enoxaparin Sodium Anaphylaxis  . Heparin Anaphylaxis  .  Hornet Venom Anaphylaxis    European Hornets  . Other Anaphylaxis and Itching    Sensitive to dye in Betadine & Chlorohexadine   . Pork-Derived Products Anaphylaxis  . Povidone-Iodine Itching and Rash    NOTE: Patient has had anaphylactic reaction to Betadine due to dyes in product. Sensitivity-" but if its wiped off she is able to tolerate betadine"   . Indomethacin Rash  . Lactose Intolerance (Gi) Diarrhea and Nausea And Vomiting    Can tolerate most [place] cheeses (Takes Lactaid)  . Phenylpropanolamine Hypertension    Medication list has been reviewed and updated.  Current Outpatient Medications on File Prior to Visit  Medication Sig Dispense Refill  . albuterol (PROVENTIL HFA;VENTOLIN HFA) 108 (90 BASE) MCG/ACT inhaler Inhale 2 puffs into the lungs every 6 (six) hours as needed for wheezing or shortness of breath.     Marland Kitchen aspirin 81 MG EC tablet Take 81 mg by mouth daily.     Marland Kitchen atorvastatin (LIPITOR) 40 MG tablet Take 1 tablet (40 mg total) by mouth daily. 90 tablet 3  . azelastine (ASTELIN) 137 MCG/SPRAY nasal spray Place 1 spray into the nose 2 (two) times daily.     . beclomethasone (QVAR) 80 MCG/ACT inhaler Inhale 2 puffs into the lungs 3 (three) times daily.    . Calcium Carb-Cholecalciferol (CALCIUM 500 +D) 500-400 MG-UNIT TABS Take 1 tablet by mouth 2 (two) times daily.    . cyclobenzaprine (FLEXERIL) 10 MG tablet Take 10 mg by mouth 3 (three) times daily as needed for muscle spasms.    Marland Kitchen docusate sodium (COLACE) 100 MG capsule Take 200 mg by mouth 2 (two) times daily.     Marland Kitchen EPINEPHrine 0.3 mg/0.3 mL IJ SOAJ injection Inject 0.3 mg  into the muscle once. As needed for anaphylaxis    . esomeprazole (NEXIUM) 40 MG capsule Take 40 mg by mouth at bedtime.     . gabapentin (NEURONTIN) 600 MG tablet Take 300-600 mg by mouth at bedtime.     . Glucosamine-Chondroitin 750-600 MG TABS Take 2 tablets by mouth 2 (two) times daily.     Marland Kitchen levothyroxine (SYNTHROID, LEVOTHROID) 75 MCG tablet Take 1 tablet (75 mcg total) by mouth daily before breakfast. 90 tablet 3  . Loratadine-Pseudoephedrine (CLARITIN-D 12 HOUR PO) Take 1 tablet by mouth 2 (two) times daily.     . Melatonin 5 MG TABS Take 2.5-5 mg by mouth at bedtime as needed.    . metaxalone (SKELAXIN) 800 MG tablet Take 800 mg by mouth every 8 (eight) hours as needed. For muscle pain    . metoprolol tartrate (LOPRESSOR) 50 MG tablet Take 1 tablet (50 mg total) by mouth 2 (two) times daily. 180 tablet 2  . Multiple Vitamin (MULTIVITAMIN WITH MINERALS) TABS Take 1 tablet by mouth daily.    . nitroGLYCERIN (NITROSTAT) 0.4 MG SL tablet DISSOLVE 1 TABLET UNDER TONGUE AS NEEDED FOR CHEST PAIN,MAY REPEAT IN5 MINUTES FOR 2 DOSES. 25 tablet 6  . Respiratory Therapy Supplies (FLUTTER) DEVI Use as directed 1 each 0  . triamcinolone (NASACORT) 55 MCG/ACT nasal inhaler Place 1-2 sprays into the nose at bedtime.     Alveda Reasons 20 MG TABS tablet TAKE 1 TABLET ONCE A DAY WITH SUPPER. 30 tablet 5   No current facility-administered medications on file prior to visit.     Review of Systems:  As per HPI- otherwise negative.   Physical Examination: Vitals:   08/29/17 1501  BP: (!) 164/76  Pulse: 84  Resp: 16  Temp: 98 F (36.7 C)  SpO2: 99%   Vitals:   08/29/17 1501  Weight: 189 lb 6.4 oz (85.9 kg)   Body mass index is 34.64 kg/m. Ideal Body Weight:    GEN: WDWN, NAD, Non-toxic, A & O x 3 HEENT: Atraumatic, Normocephalic. Neck supple. No masses, No LAD. Ears and Nose: No external deformity. CV: RRR, No M/G/R. No JVD. No thrill. No extra heart sounds. PULM: CTA B, no wheezes,  crackles, rhonchi. No retractions. No resp. distress. No accessory muscle use. ABD: S, NT, ND, +BS. No rebound. No HSM. EXTR: No c/c/e NEURO Normal gait for pt- severe kyphosis/ scoliosis PSYCH: Normally interactive. Conversant. Not depressed or anxious appearing.  Calm demeanor.  Overweight, otherwise looks well Right sided cerumen impaction-  Irrigated and resolved   Assessment and Plan: Impacted cerumen of right ear  Elevated BP without diagnosis of hypertension   Resolved cerumen impaction Flu UTD She will see cardiology in less than a month- she will check her BP at home until then and alert me if overly high  Signed Lamar Blinks, MD

## 2017-08-29 ENCOUNTER — Encounter: Payer: Self-pay | Admitting: Family Medicine

## 2017-08-29 ENCOUNTER — Ambulatory Visit (INDEPENDENT_AMBULATORY_CARE_PROVIDER_SITE_OTHER): Payer: Medicare Other | Admitting: Family Medicine

## 2017-08-29 VITALS — BP 152/88 | HR 84 | Temp 98.0°F | Resp 16 | Wt 189.4 lb

## 2017-08-29 DIAGNOSIS — R030 Elevated blood-pressure reading, without diagnosis of hypertension: Secondary | ICD-10-CM

## 2017-08-29 DIAGNOSIS — H6121 Impacted cerumen, right ear: Secondary | ICD-10-CM | POA: Diagnosis not present

## 2017-08-29 DIAGNOSIS — I6523 Occlusion and stenosis of bilateral carotid arteries: Secondary | ICD-10-CM | POA: Diagnosis not present

## 2017-08-29 NOTE — Patient Instructions (Signed)
Please monitor your BP at home- if it continues to run higher than 140/90 please give me a call Otherwise we can visit with you in about 6 months

## 2017-09-16 DIAGNOSIS — J309 Allergic rhinitis, unspecified: Secondary | ICD-10-CM | POA: Diagnosis not present

## 2017-09-16 DIAGNOSIS — Z91030 Bee allergy status: Secondary | ICD-10-CM | POA: Diagnosis not present

## 2017-09-16 DIAGNOSIS — J4530 Mild persistent asthma, uncomplicated: Secondary | ICD-10-CM | POA: Diagnosis not present

## 2017-09-19 ENCOUNTER — Encounter: Payer: Self-pay | Admitting: Physician Assistant

## 2017-09-23 ENCOUNTER — Ambulatory Visit (INDEPENDENT_AMBULATORY_CARE_PROVIDER_SITE_OTHER): Payer: Medicare Other | Admitting: Physician Assistant

## 2017-09-23 ENCOUNTER — Encounter: Payer: Self-pay | Admitting: Physician Assistant

## 2017-09-23 VITALS — BP 154/90 | HR 82 | Ht 62.0 in | Wt 192.8 lb

## 2017-09-23 DIAGNOSIS — I10 Essential (primary) hypertension: Secondary | ICD-10-CM

## 2017-09-23 DIAGNOSIS — I493 Ventricular premature depolarization: Secondary | ICD-10-CM | POA: Diagnosis not present

## 2017-09-23 DIAGNOSIS — I480 Paroxysmal atrial fibrillation: Secondary | ICD-10-CM

## 2017-09-23 DIAGNOSIS — I6523 Occlusion and stenosis of bilateral carotid arteries: Secondary | ICD-10-CM | POA: Insufficient documentation

## 2017-09-23 DIAGNOSIS — I340 Nonrheumatic mitral (valve) insufficiency: Secondary | ICD-10-CM | POA: Diagnosis not present

## 2017-09-23 DIAGNOSIS — I2510 Atherosclerotic heart disease of native coronary artery without angina pectoris: Secondary | ICD-10-CM

## 2017-09-23 DIAGNOSIS — E785 Hyperlipidemia, unspecified: Secondary | ICD-10-CM | POA: Diagnosis not present

## 2017-09-23 MED ORDER — METOPROLOL TARTRATE 100 MG PO TABS: 100.0000 mg | ORAL_TABLET | Freq: Two times a day (BID) | ORAL | 3 refills | Status: DC

## 2017-09-23 NOTE — Patient Instructions (Signed)
Medication Instructions:  Your physician has recommended you make the following change in your medication: 1.  INCREASE the Metoprolol to 100 mg daily   Labwork: TODAY:  BMET, CBC, MAGNESIUM. & TSH  Testing/Procedures: Your physician has requested that you have an echocardiogram. Echocardiography is a painless test that uses sound waves to create images of your heart. It provides your doctor with information about the size and shape of your heart and how well your heart's chambers and valves are working. This procedure takes approximately one hour. There are no restrictions for this procedure.    Follow-Up: Your physician recommends that you schedule a follow-up appointment in: [place] DAYNA Idolina Primer, PA-C  Your physician wants you to follow-up in: [place] DR. Angelena Form  You will receive a reminder letter in the mail two months in advance. If you don't receive a letter, please call our office to schedule the follow-up appointment.    Any Other Special Instructions Will Be Listed Below (If Applicable).   Echocardiogram An echocardiogram, or echocardiography, uses sound waves (ultrasound) to produce an image of your heart. The echocardiogram is simple, painless, obtained within a short period of time, and offers valuable information to your health care provider. The images from an echocardiogram can provide information such as:  Evidence of coronary artery disease (CAD).  Heart size.  Heart muscle function.  Heart valve function.  Aneurysm detection.  Evidence of a past heart attack.  Fluid buildup around the heart.  Heart muscle thickening.  Assess heart valve function.  Tell a health care provider about:  Any allergies you have.  All medicines you are taking, including vitamins, herbs, eye drops, creams, and over-the-counter medicines.  Any problems you or family members have had with anesthetic medicines.  Any blood disorders you have.  Any surgeries you  have had.  Any medical conditions you have.  Whether you are pregnant or may be pregnant. What happens before the procedure? No special preparation is needed. Eat and drink normally. What happens during the procedure?  In order to produce an image of your heart, gel will be applied to your chest and a wand-like tool (transducer) will be moved over your chest. The gel will help transmit the sound waves from the transducer. The sound waves will harmlessly bounce off your heart to allow the heart images to be captured in real-time motion. These images will then be recorded.  You may need an IV to receive a medicine that improves the quality of the pictures. What happens after the procedure? You may return to your normal schedule including diet, activities, and medicines, unless your health care provider tells you otherwise. This information is not intended to replace advice given to you by your health care provider. Make sure you discuss any questions you have with your health care provider. Document Released: 10/05/2000 Document Revised: 05/26/2016 Document Reviewed: 06/15/2013 Elsevier Interactive Patient Education  2017 [employer].   If you need a refill on your cardiac medications before your next appointment, please call your pharmacy.

## 2017-09-23 NOTE — Progress Notes (Signed)
Cardiology Office Note    Date:  09/23/2017  ID:  TIKIA SKILTON, DOB 01-01-1946, MRN [id number] PCP:  Darreld Mclean, MD  Cardiologist:  Dr. Angelena Form   Chief Complaint: 6 month f/u CAD, atrial fib  History of Present Illness:  Latoya [name] is a 71 y.o. female with history of moderate CAD by cath in 2003, PAF, moderate mitral regurgitation, HTN, hyperlipidemia, anti-phospholipid antibody syndrome/hypercoagulable state, remote DVT/PE, mild carotid artery disease, GERD, chronic back pain on narcotics, asthma who presents for follow-up. Last cath was in 2003 with moderate CAD (30% ostial LM stenosis, 50% mid LAD, 80% moderate sized Diagonal, 80% Circumflex). She has had no cath since then. Most recent stress test October 2013 showed apical thinning without obvious ischemia, lateral wall uninterpretable due to large bowel artifact, EF 47%. . Last echo August 2017 showed LVEF 50%, grade 2 DD, moderate mitral regurgitation with moderate LAE. Carotid duplex in 03/2017 showed 1-39% bilateral internal carotid stenosis (high end range on left), >50% LECA stenosis, with recommendation for f/u duplex 03/2018. She had complex admission 05/2016 with sepsis, ascending cholangitis complicated by respiratory failure, renal failure, ?possible CHF. Discharge summary indicates combined HF although LVEF was 50% and Lasix was discontinued per notes due to no crackles or edema. She underwent lap cholecystectomy 10/04/16. Last labs include normal TSH and A1C 04/2017, Hgb 13.8 in 01/2017, K 4.4, normal LFTs, Cr 0.76 in 10/2016; no lipid profile available in last 12 months. Last OV 01/2017 and was doing well, in NSR at that time.   She returns for 6 month follow-up today and says she's doing great. She has not had any chest pain, dyspnea, dizziness, headaches or syncope. Her blood pressure is elevated today - 154/90 upon first check, then recheck 180/88 by me. She has a cuff at home but has not been checking it this year. She  believes this is due to weight gain. She follows with pain management due to chronic back pain (has had 16 surgeries). She usually exercises in the pool but it has been out of commission. She plans on getting back into it in the new year. No unusual bleeding.   Past Medical History:  Diagnosis Date  . Anemia, iron deficiency   . Antiphospholipid syndrome (HCC)    with hypercoagulable state  . Asthma    extrinsic; moderate, persistant, nml spirometry 2010, nml CXR 1/08  . Bladder troubles    REPORTS INFECTIONS ON OCCASION DUE TO URETHRA MEATUS STRICTURE AT BIRTH   . CAD (coronary artery disease)    a. 50% mid LAD, 80% ostial D1 and moderate 80% mid circ by cath 2003. b. nonischemic nuc in 2013.  Marland Kitchen Cancer ([place])    basal cell removed fr. L arm   . Carotid arterial disease ([place])   . Complication of anesthesia    cardiac arrest- in OR, at age 23 (58)y.o. during Scalenotomy in her early 20's  . Drug allergy    heparin/lovenox  . DVT (deep venous thrombosis) ([place])   . Erosive gastritis 1994  . Family history of adverse reaction to anesthesia    some family members have had trouble waking up  . GERD (gastroesophageal reflux disease)   . H/O hiatal hernia   . HLD (hyperlipidemia)   . HTN (hypertension)    Pt states her high blood pressure readings are due tot he method of measurements[facility]aney at [employer], manages pt.  LOV 03/2014  . Hypothyroidism   . Liver spot  cyst- - no biopsy, but told that its benign   . PAF (paroxysmal atrial fibrillation) ([place])   . Pulmonary embolism ([place])    related to back surgery with prior coumadin use, now off  . Spondylosis, lumbosacral    ARTHRITIS- OA     Past Surgical History:  Procedure Laterality Date  . ABDOMINAL HYSTERECTOMY     partial abdominal -1998  . BACK SURGERY     x13 cervical and lumbar thoracic spine surgery  . BREAST ENHANCEMENT SURGERY    . BREAST SURGERY     all benign cysts x3   . CARDIAC CATHETERIZATION     2005  .  CHOLECYSTECTOMY N/A 10/04/2016   Procedure: LAPAROSCOPIC CHOLECYSTECTOMY;  Surgeon: Erroll Luna, MD;  Location: [facility] OR;  Service: General;  Laterality: N/A;  . cleft lip and palate repair     71 yo   . hysterectomy    . IR GENERIC HISTORICAL  06/04/2016   IR PERC CHOLECYSTOSTOMY 06/04/2016 Greggory Keen, MD [facility]  . IR GENERIC HISTORICAL  08/06/2016   IR CHOLANGIOGRAM EXISTING TUBE 08/06/2016 Aletta Edouard, MD [facility]  . IR GENERIC HISTORICAL  07/17/2016   IR RADIOLOGIST EVAL & MGMT 07/17/2016 [facility]  . JOINT REPLACEMENT    . POSTERIOR CERVICAL FUSION/FORAMINOTOMY Right 08/31/2014   Procedure: Right cervical six-seven foraminotomy, Cervical five-Thoracic one fusion, Cervical six-seven lateral mass screws;  Surgeon: Erline Levine, MD;  Location: [facility];  Service: Neurosurgery;  Laterality: Right;  . spina bifida repair     71 yo   . TONSILLECTOMY    . TOTAL KNEE ARTHROPLASTY     left    Current Medications: Current Meds  Medication Sig  . albuterol (PROVENTIL HFA;VENTOLIN HFA) 108 (90 BASE) MCG/ACT inhaler Inhale 2 puffs into the lungs every 6 (six) hours as needed for wheezing or shortness of breath.   Marland Kitchen aspirin 81 MG EC tablet Take 81 mg by mouth daily.   Marland Kitchen atorvastatin (LIPITOR) 40 MG tablet Take 1 tablet (40 mg total) by mouth daily.  Marland Kitchen azelastine (ASTELIN) 137 MCG/SPRAY nasal spray Place 1 spray into the nose 2 (two) times daily.   . beclomethasone (QVAR) 80 MCG/ACT inhaler Inhale 2 puffs into the lungs 3 (three) times daily.  . Calcium Carb-Cholecalciferol (CALCIUM 500 +D) 500-400 MG-UNIT TABS Take 1 tablet by mouth 2 (two) times daily.  . cyclobenzaprine (FLEXERIL) 10 MG tablet Take 10 mg by mouth 3 (three) times daily as needed for muscle spasms.  Marland Kitchen docusate sodium (COLACE) 100 MG capsule Take 200 mg by mouth 2 (two) times daily.   Marland Kitchen EPINEPHrine 0.3 mg/0.3 mL IJ SOAJ injection Inject 0.3 mg into the muscle once. As needed for anaphylaxis  .  esomeprazole (NEXIUM) 40 MG capsule Take 40 mg by mouth at bedtime.   . gabapentin (NEURONTIN) 600 MG tablet Take 300-600 mg by mouth at bedtime.   . Glucosamine-Chondroitin 750-600 MG TABS Take 2 tablets by mouth 2 (two) times daily.   Marland Kitchen levothyroxine (SYNTHROID, LEVOTHROID) 75 MCG tablet Take 1 tablet (75 mcg total) by mouth daily before breakfast.  . Loratadine-Pseudoephedrine (CLARITIN-D 12 HOUR PO) Take 1 tablet by mouth 2 (two) times daily.   . Melatonin 5 MG TABS Take 2.5-5 mg by mouth at bedtime as needed.  . metaxalone (SKELAXIN) 800 MG tablet Take 800 mg by mouth every 8 (eight) hours as needed. For muscle pain  . metoprolol tartrate (LOPRESSOR) 50 MG tablet Take 1 tablet (50 mg total)  by mouth 2 (two) times daily.  . Multiple Vitamin (MULTIVITAMIN WITH MINERALS) TABS Take 1 tablet by mouth daily.  . nitroGLYCERIN (NITROSTAT) 0.4 MG SL tablet DISSOLVE 1 TABLET UNDER TONGUE AS NEEDED FOR CHEST PAIN,MAY REPEAT IN5 MINUTES FOR 2 DOSES.  Marland Kitchen Respiratory Therapy Supplies (FLUTTER) DEVI Use as directed  . triamcinolone (NASACORT) 55 MCG/ACT nasal inhaler Place 1-2 sprays into the nose at bedtime.   Alveda Reasons 20 MG TABS tablet TAKE 1 TABLET ONCE A DAY WITH SUPPER.     Allergies:   Chlorhexidine; Enoxaparin sodium; Heparin; Hornet venom; Other; Pork-derived products; Povidone-iodine; Indomethacin; Lactose intolerance (gi); and Phenylpropanolamine   Social History   Socioeconomic History  . Marital status: Married    Spouse name: None  . Number of children: None  . Years of education: None  . Highest education level: None  Social Needs  . Financial resource strain: None  . Food insecurity - worry: None  . Food insecurity - inability: None  . Transportation needs - medical: None  . Transportation needs - non-medical: None  Occupational History  . None  Tobacco Use  . Smoking status: Never Smoker  . Smokeless tobacco: Never Used  . Tobacco comment: no smoking   Substance and Sexual  Activity  . Alcohol use: No  . Drug use: No  . Sexual activity: None  Other Topics Concern  . None  Social History Narrative   Lives in [place] with husband; has had miscarriages but no children.    Disabled but exercises nearly every day (can only exericse in water - aerobics)   Takes opioids for pain relief; takes no herbal medications; has a heart healthy diet.      Family History:  Family History  Problem Relation Age of Onset  . Heart disease Father        MI   . Heart disease Sister     ROS:    Please see the history of present illness.   All other systems are reviewed and otherwise negative.    PHYSICAL EXAM:   VS:  BP (!) 154/90   Pulse 82   Ht 5\' 2"  (1.575 m)   Wt 192 lb 12.8 oz (87.5 kg)   SpO2 99%   BMI 35.26 kg/m   BMI: Body mass index is 35.26 kg/m. GEN: Well nourished, well developed obese WF, in no acute distress  HEENT: normocephalic, atraumatic Neck: no JVD, carotid bruits, or masses Cardiac: RRR; no murmurs, rubs, or gallops, trace sockline edema due to tight socks (large baseline leg habitus) Respiratory:  clear to auscultation bilaterally, normal work of breathing GI: soft, nontender, nondistended, + BS MS: no deformity or atrophy  Skin: warm and dry, no rash Neuro:  Alert and Oriented x 3, Strength and sensation are intact, follows commands Psych: euthymic mood, full affect  Wt Readings from Last 3 Encounters:  09/23/17 192 lb 12.8 oz (87.5 kg)  08/29/17 189 lb 6.4 oz (85.9 kg)  04/29/17 177 lb 14.1 oz (80.7 kg)      Studies/Labs Reviewed:   EKG:  EKG was ordered today and personally reviewed by me and demonstrates NSR 77-82bpm with nonspecific ST-T changes, minimal ST sagging in I, II, occ PVCs. Two tracings were obtained due to baseline wander (patient tremor). Second tracing shows nonspecific ST change in V2 but this appears to be lead placement.  Recent Labs: 11/21/2016: ALT 17; BUN 15; Creatinine, Ser 0.76; Potassium 4.4; Sodium  139 01/28/2017: Hemoglobin 13.8; Platelets 260.0 04/29/2017: TSH  1.62   Lipid Panel    Component Value Date/Time   CHOL 170 12/21/2015   TRIG 211 (H) 06/07/2016 0415   HDL 59 12/21/2015   CHOLHDL 4 08/05/2009 0935   VLDL 51.8 (H) 08/05/2009 0935   LDLDIRECT 98.6 08/05/2009 0935    Additional studies/ records that were reviewed today include: Summarized above    ASSESSMENT & PLAN:   1. CAD - doing well without angina. Dr. Angelena Form has maintained her on aspirin long term in addition to beta blocker. She is also on statin and states lipids are followed by PCP. Would recommend goal LDL of <70 given her coronary and carotid disease. 2. Paroxysmal atrial fib - no recent palpitations to suggest recurrence. Continue Xarelto. Update BMET/CBC for surveillance. 3. Carotid artery disease - will be due for repeat duplex around 03/2018; will defer to Dr. Angelena Form to arrange as he will be seeing her around that time. 4. HTN - blood pressure is elevated today, confirmed on recheck. She reports multiple family members requiring multiple antihypertensives so she's not surprised. She asks that we go slow with med titration due to prior history of fainting remotely with aggressive titration. Will increase metoprolol to 100mg  BID. I suspect she'll need an additional agent to control pressure. Also discussed importance of weight loss and sodium restriction. Check TSH. 5. Hyperlipidemia - followed by PCP. 6. Mitral regurgitation - asymptomatic. Per guidelines, given moderate MR on last assessment, will update echocardiogram (should be done every 1-2 years). Will optimize BP control. 7. Frequent PVCs - asymptomatic, incidentally noted on EKG today. Will update 2D echocardiogram. Titrate BB as above. If LVEF is depressed, would need to consider Holter to assess PVC burden. Check lytes.  Disposition: F/u with me in 2 weeks for BP check/discuss echo/assess PVCs, and 6 months with Dr. Angelena Form.   Medication  Adjustments/Labs and Tests Ordered: Current medicines are reviewed at length with the patient today.  Concerns regarding medicines are outlined above. Medication changes, Labs and Tests ordered today are summarized above and listed in the Patient Instructions accessible in Encounters.   Signed, Charlie Pitter, PA-C  09/23/2017 3:37 PM    [facility] [address], [place], [place]  [address] Phone: [phone]; Fax: [phone]

## 2017-09-24 LAB — CBC
HEMATOCRIT: 43.1 % (ref 34.0–46.6)
Hemoglobin: 14.7 g/dL (ref 11.1–15.9)
MCH: 29.7 pg (ref 26.6–33.0)
MCHC: 34.1 g/dL (ref 31.5–35.7)
MCV: 87 fL (ref 79–97)
PLATELETS: 249 10*3/uL (ref 150–379)
RBC: 4.95 x10E6/uL (ref 3.77–5.28)
RDW: 13.7 % (ref 12.3–15.4)
WBC: 9.5 10*3/uL (ref 3.4–10.8)

## 2017-09-24 LAB — BASIC METABOLIC PANEL
BUN / CREAT RATIO: 18 (ref 12–28)
BUN: 14 mg/dL (ref 8–27)
CHLORIDE: 101 mmol/L (ref 96–106)
CO2: 21 mmol/L (ref 20–29)
CREATININE: 0.76 mg/dL (ref 0.57–1.00)
Calcium: 10 mg/dL (ref 8.7–10.3)
GFR calc non Af Amer: 80 mL/min/{1.73_m2} (ref >59)
GFR, EST AFRICAN AMERICAN: 92 mL/min/{1.73_m2} (ref >59)
GLUCOSE: 87 mg/dL (ref 65–99)
Potassium: 4.3 mmol/L (ref 3.5–5.2)
SODIUM: 141 mmol/L (ref 134–144)

## 2017-09-24 LAB — MAGNESIUM: Magnesium: 2.2 mg/dL (ref 1.6–2.3)

## 2017-09-24 LAB — TSH: TSH: 1.4 u[IU]/mL (ref 0.450–4.500)

## 2017-10-01 ENCOUNTER — Other Ambulatory Visit (HOSPITAL_COMMUNITY): Payer: Medicare Other

## 2017-10-04 ENCOUNTER — Other Ambulatory Visit: Payer: Self-pay

## 2017-10-04 ENCOUNTER — Encounter: Payer: Self-pay | Admitting: Physician Assistant

## 2017-10-04 ENCOUNTER — Ambulatory Visit (HOSPITAL_COMMUNITY): Payer: Medicare Other | Attending: Internal Medicine

## 2017-10-04 DIAGNOSIS — I10 Essential (primary) hypertension: Secondary | ICD-10-CM

## 2017-10-04 DIAGNOSIS — I480 Paroxysmal atrial fibrillation: Secondary | ICD-10-CM | POA: Diagnosis not present

## 2017-10-04 DIAGNOSIS — I6523 Occlusion and stenosis of bilateral carotid arteries: Secondary | ICD-10-CM

## 2017-10-04 DIAGNOSIS — I493 Ventricular premature depolarization: Secondary | ICD-10-CM

## 2017-10-04 DIAGNOSIS — Z86711 Personal history of pulmonary embolism: Secondary | ICD-10-CM | POA: Insufficient documentation

## 2017-10-04 DIAGNOSIS — I4891 Unspecified atrial fibrillation: Secondary | ICD-10-CM | POA: Insufficient documentation

## 2017-10-04 DIAGNOSIS — I340 Nonrheumatic mitral (valve) insufficiency: Secondary | ICD-10-CM | POA: Diagnosis not present

## 2017-10-04 DIAGNOSIS — Z8249 Family history of ischemic heart disease and other diseases of the circulatory system: Secondary | ICD-10-CM | POA: Diagnosis not present

## 2017-10-04 DIAGNOSIS — I2510 Atherosclerotic heart disease of native coronary artery without angina pectoris: Secondary | ICD-10-CM

## 2017-10-04 DIAGNOSIS — E785 Hyperlipidemia, unspecified: Secondary | ICD-10-CM | POA: Diagnosis not present

## 2017-10-04 NOTE — Progress Notes (Signed)
Cardiology Office Note    Date:  10/07/2017  ID:  Latoya [name], DOB 1946-06-16, MRN [id number] PCP:  Darreld Mclean, MD  Cardiologist:  Dr. Angelena Form   Chief Complaint: f/u BP  History of Present Illness:  Latoya [name] is a 71 y.o. female with history of moderate CAD by cath in 2003, PAF, most recently trivial mitral regurgitation, HTN, hyperlipidemia, anti-phospholipid antibody syndrome/hypercoagulable state, remote DVT/PE, mild carotid artery disease, GERD, chronic back pain on narcotics, asthma who presents for follow-up. Last cath was in 2003 with moderate CAD (30% ostial LM stenosis, 50% mid LAD, 80% moderate sized Diagonal, 80% Circumflex). She has had no cath since then. Most recent stress test October 2013 showed apical thinning without obvious ischemia, lateral wall uninterpretable due to large bowel artifact, EF 47%. Carotid duplex in 03/2017 showed 1-39% bilateral internal carotid stenosis (high end range on left), >50% LECA stenosis, with recommendation for f/u duplex 03/2018. She had complex admission 05/2016 with sepsis, ascending cholangitis complicated by respiratory failure, renal failure, ?possible CHF. Discharge summary indicates combined HF although LVEF was 50% and Lasix was discontinued per notes due to no crackles or edema. She underwent lap cholecystectomy 10/04/16. At f/u on 09/23/17 she was doing great without any symptoms other than chronic back pain. She had not been exercising as usual due to her usual pool location being out of commission. Her blood pressure was running 180/88. She requested slow titration of meds due to prior history of fainting with aggressive titration back in 2003 prior to dx of CAD. EKG also incidentally noted frequent PVCs which was new. Her metoprolol was increased to 100mg  BID. Labs 09/23/17 showed Hgb 14.7, K 4.3, Cr 0.76, Mg 2.2, normal TSH. 2D echo 10/04/17 showed EF 50-55%, grade 2 DD, trivial MR.  She returns for follow-up feeling well. No  new symptoms. No chest pain, SOB. She has mild chronic trace LEE. No orthopnea. Tolerated titration of metoprolol well, except BP remains high. Initial BP by tech 155/79 L arm / 158/89 R arm, but recheck by me was 178/85 in L arm and 182/92 in R arm.   Past Medical History:  Diagnosis Date  . Anemia, iron deficiency   . Antiphospholipid syndrome (HCC)    with hypercoagulable state  . Asthma    extrinsic; moderate, persistant, nml spirometry 2010, nml CXR 1/08  . Bladder troubles    REPORTS INFECTIONS ON OCCASION DUE TO URETHRA MEATUS STRICTURE AT BIRTH   . CAD (coronary artery disease)    a. 50% mid LAD, 80% ostial D1 and moderate 80% mid circ by cath 2003. b. nonischemic nuc in 2013.  Marland Kitchen Cancer ([place])    basal cell removed fr. L arm   . Carotid arterial disease ([place])    a. 03/2017 showed 1-39% bilateral internal carotid stenosis (high end range on left), >50% LECA stenosis, with recommendation for f/u duplex 03/2018.  Marland Kitchen Complication of anesthesia    cardiac arrest- in OR, at age 6 (3)y.o. during Scalenotomy in her early 20's  . Drug allergy    heparin/lovenox  . DVT (deep venous thrombosis) ([place])   . Erosive gastritis 1994  . Family history of adverse reaction to anesthesia    some family members have had trouble waking up  . GERD (gastroesophageal reflux disease)   . H/O hiatal hernia   . HLD (hyperlipidemia)   . HTN (hypertension)    Pt states her high blood pressure readings are due tot he method of [facility]  at [place], manages pt.  LOV 03/2014  . Hypothyroidism   . Liver spot    cyst- - no biopsy, but told that its benign   . Moderate mitral regurgitation   . PAF (paroxysmal atrial fibrillation) ([place])   . Pulmonary embolism ([place])    related to back surgery with prior coumadin use, now off  . PVC's (premature ventricular contractions)    a. noted in 09/2017.  Marland Kitchen Spondylosis, lumbosacral    ARTHRITIS- OA     Past Surgical History:  Procedure Laterality Date    . ABDOMINAL HYSTERECTOMY     partial abdominal -1998  . BACK SURGERY     x13 cervical and lumbar thoracic spine surgery  . BREAST ENHANCEMENT SURGERY    . BREAST SURGERY     all benign cysts x3   . CARDIAC CATHETERIZATION     2005  . CHOLECYSTECTOMY N/A 10/04/2016   Procedure: LAPAROSCOPIC CHOLECYSTECTOMY;  Surgeon: Erroll Luna, MD;  Location: [facility] OR;  Service: General;  Laterality: N/A;  . cleft lip and palate repair     71 yo   . hysterectomy    . IR GENERIC HISTORICAL  06/04/2016   IR PERC CHOLECYSTOSTOMY 06/04/2016 Greggory Keen, MD [facility]  . IR GENERIC HISTORICAL  08/06/2016   IR CHOLANGIOGRAM EXISTING TUBE 08/06/2016 Aletta Edouard, MD [facility]  . IR GENERIC HISTORICAL  07/17/2016   IR RADIOLOGIST EVAL & MGMT 07/17/2016 [facility]  . JOINT REPLACEMENT    . POSTERIOR CERVICAL FUSION/FORAMINOTOMY Right 08/31/2014   Procedure: Right cervical six-seven foraminotomy, Cervical five-Thoracic one fusion, Cervical six-seven lateral mass screws;  Surgeon: Erline Levine, MD;  Location: [facility];  Service: Neurosurgery;  Laterality: Right;  . spina bifida repair     71 yo   . TONSILLECTOMY    . TOTAL KNEE ARTHROPLASTY     left    Current Medications: Current Meds  Medication Sig  . albuterol (PROVENTIL HFA;VENTOLIN HFA) 108 (90 BASE) MCG/ACT inhaler Inhale 2 puffs into the lungs every 6 (six) hours as needed for wheezing or shortness of breath.   Marland Kitchen aspirin 81 MG EC tablet Take 81 mg by mouth daily.   Marland Kitchen atorvastatin (LIPITOR) 40 MG tablet Take 1 tablet (40 mg total) by mouth daily.  Marland Kitchen azelastine (ASTELIN) 137 MCG/SPRAY nasal spray Place 1 spray into the nose 2 (two) times daily.   . beclomethasone (QVAR) 80 MCG/ACT inhaler Inhale 2 puffs into the lungs 3 (three) times daily.  . Calcium Carb-Cholecalciferol (CALCIUM 500 +D) 500-400 MG-UNIT TABS Take 1 tablet by mouth 2 (two) times daily.  . cyclobenzaprine (FLEXERIL) 10 MG tablet Take 10 mg by mouth 3 (three) times  daily as needed for muscle spasms.  Marland Kitchen docusate sodium (COLACE) 100 MG capsule Take 200 mg by mouth 2 (two) times daily.   Marland Kitchen EPINEPHrine 0.3 mg/0.3 mL IJ SOAJ injection Inject 0.3 mg into the muscle once. As needed for anaphylaxis  . esomeprazole (NEXIUM) 40 MG capsule Take 40 mg by mouth at bedtime.   . gabapentin (NEURONTIN) 600 MG tablet Take 300-600 mg by mouth at bedtime.   . Glucosamine-Chondroitin 750-600 MG TABS Take 2 tablets by mouth 2 (two) times daily.   Marland Kitchen levothyroxine (SYNTHROID, LEVOTHROID) 75 MCG tablet Take 1 tablet (75 mcg total) by mouth daily before breakfast.  . Loratadine-Pseudoephedrine (CLARITIN-D 12 HOUR PO) Take 1 tablet by mouth 2 (two) times daily.   . Melatonin 5 MG TABS Take 2.5-5 mg by mouth at  bedtime as needed.  . metaxalone (SKELAXIN) 800 MG tablet Take 800 mg by mouth every 8 (eight) hours as needed. For muscle pain  . metoprolol tartrate (LOPRESSOR) 100 MG tablet Take 1 tablet (100 mg total) by mouth 2 (two) times daily.  . Multiple Vitamin (MULTIVITAMIN WITH MINERALS) TABS Take 1 tablet by mouth daily.  . nitroGLYCERIN (NITROSTAT) 0.4 MG SL tablet DISSOLVE 1 TABLET UNDER TONGUE AS NEEDED FOR CHEST PAIN,MAY REPEAT IN5 MINUTES FOR 2 DOSES.  Marland Kitchen Respiratory Therapy Supplies (FLUTTER) DEVI Use as directed  . triamcinolone (NASACORT) 55 MCG/ACT nasal inhaler Place 1-2 sprays into the nose at bedtime.   Alveda Reasons 20 MG TABS tablet TAKE 1 TABLET ONCE A DAY WITH SUPPER.     Allergies:   Chlorhexidine; Enoxaparin sodium; Heparin; Hornet venom; Other; Pork-derived products; Povidone-iodine; Indomethacin; Lactose intolerance (gi); and Phenylpropanolamine   Social History   Socioeconomic History  . Marital status: Married    Spouse name: None  . Number of children: None  . Years of education: None  . Highest education level: None  Social Needs  . Financial resource strain: None  . Food insecurity - worry: None  . Food insecurity - inability: None  .  Transportation needs - medical: None  . Transportation needs - non-medical: None  Occupational History  . None  Tobacco Use  . Smoking status: Never Smoker  . Smokeless tobacco: Never Used  . Tobacco comment: no smoking   Substance and Sexual Activity  . Alcohol use: No  . Drug use: No  . Sexual activity: None  Other Topics Concern  . None  Social History Narrative   Lives in [place] with husband; has had miscarriages but no children.    Disabled but exercises nearly every day (can only exericse in water - aerobics)   Takes opioids for pain relief; takes no herbal medications; has a heart healthy diet.      Family History:  Family History  Problem Relation Age of Onset  . Heart disease Father        MI   . Heart disease Sister     ROS:   Please see the history of present illness. All other systems are reviewed and otherwise negative.    PHYSICAL EXAM:   VS:  BP (!) 178/85 (BP Location: Left Arm)   Pulse 82   Ht 5\' 2"  (1.575 m)   Wt 193 lb (87.5 kg)   SpO2 100%   BMI 35.30 kg/m   BMI: Body mass index is 35.3 kg/m. GEN: Well nourished, well developed obese WF, in no acute distress  HEENT: normocephalic, atraumatic Neck: no JVD, carotid bruits, or masses Cardiac: RRR; no murmurs, rubs, or gallops, no edema  Respiratory:  clear to auscultation bilaterally, normal work of breathing GI: soft, nontender, nondistended, + BS MS: kyphotic posture Skin: warm and dry, no rash Neuro:  Alert and Oriented x 3, Strength and sensation are intact, follows commands Psych: euthymic mood, full affect  Wt Readings from Last 3 Encounters:  10/07/17 193 lb (87.5 kg)  09/23/17 192 lb 12.8 oz (87.5 kg)  08/29/17 189 lb 6.4 oz (85.9 kg)      Studies/Labs Reviewed:   EKG:  EKG was ordered today and personally reviewed by me and demonstrates baseline wander, NSR 78bpm nonspecific ST-T changes, no PVCs.  Recent Labs: 11/21/2016: ALT 17 09/23/2017: BUN 14; Creatinine, Ser 0.76;  Hemoglobin 14.7; Magnesium 2.2; Platelets 249; Potassium 4.3; Sodium 141; TSH 1.400   Lipid Panel  Component Value Date/Time   CHOL 170 12/21/2015   TRIG 211 (H) 06/07/2016 0415   HDL 59 12/21/2015   CHOLHDL 4 08/05/2009 0935   VLDL 51.8 (H) 08/05/2009 0935   LDLDIRECT 98.6 08/05/2009 0935    Additional studies/ records that were reviewed today include: Summarized above    ASSESSMENT & PLAN:   1. Essential HTN - BP remains markedly elevated upon recheck. Will start chlorthalidone 25mg  daily. I asked her to start 1/2 tablet on day 1 to make sure she tolerates this, then increase to 1 full tablet the next day if feeling well and BP remains elevated. Recheck BMET in 1 week. Recheck BP visit in 2 weeks. Asked her to start using the cuff she bought regularly (daily) and call if she notices BP continuing to run >130/80. Given diastolic dysfunction on echo, need to make sure we are getting good control of BP. If her pressure remains elevated despite med titration, would consider renal duplex or UA for proteinuria. 2. CAD - doing well without angina. Continue current regimen. 3. Paroxysmal atrial fib - no recent palpitations to suggest recurrence. Continue Xarelto. Recent BMET/CBC wnl. 4. Carotid artery disease - will be due for repeat duplex around 03/2018. Will defer to Dr. Angelena Form to arrange as he will be seeing her around that time. At last OV we recommended f/u 6 months. 5. Hyperlipidemia - followed by primary care. Recommend goal LDL <70. 6. Mitral regurgitation - now trivial by echo.  7. Frequent PVCs - quiescent on higher dose of beta blocker.  Disposition: F/u with HTN clinic in 2-3 weeks (me as backup); previous OV we recommended 6 month recall with [facility].   Medication Adjustments/Labs and Tests Ordered: Current medicines are reviewed at length with the patient today.  Concerns regarding medicines are outlined above. Medication changes, Labs and Tests ordered today are  summarized above and listed in the Patient Instructions accessible in Encounters.   Signed, Charlie Pitter, PA-C  10/07/2017 11:55 AM    [address], [place], [place]  [address] Phone: [phone]; Fax: [phone]

## 2017-10-07 ENCOUNTER — Ambulatory Visit (INDEPENDENT_AMBULATORY_CARE_PROVIDER_SITE_OTHER): Payer: Medicare Other | Admitting: Physician Assistant

## 2017-10-07 ENCOUNTER — Encounter: Payer: Self-pay | Admitting: Physician Assistant

## 2017-10-07 VITALS — BP 178/85 | HR 82 | Ht 62.0 in | Wt 193.0 lb

## 2017-10-07 DIAGNOSIS — I2510 Atherosclerotic heart disease of native coronary artery without angina pectoris: Secondary | ICD-10-CM | POA: Diagnosis not present

## 2017-10-07 DIAGNOSIS — I340 Nonrheumatic mitral (valve) insufficiency: Secondary | ICD-10-CM | POA: Diagnosis not present

## 2017-10-07 DIAGNOSIS — I6523 Occlusion and stenosis of bilateral carotid arteries: Secondary | ICD-10-CM | POA: Diagnosis not present

## 2017-10-07 DIAGNOSIS — I480 Paroxysmal atrial fibrillation: Secondary | ICD-10-CM | POA: Diagnosis not present

## 2017-10-07 DIAGNOSIS — I10 Essential (primary) hypertension: Secondary | ICD-10-CM

## 2017-10-07 DIAGNOSIS — E785 Hyperlipidemia, unspecified: Secondary | ICD-10-CM

## 2017-10-07 DIAGNOSIS — I493 Ventricular premature depolarization: Secondary | ICD-10-CM

## 2017-10-07 MED ORDER — CHLORTHALIDONE 25 MG PO TABS: 25.0000 mg | ORAL_TABLET | Freq: Every day | ORAL | 3 refills | Status: DC

## 2017-10-07 NOTE — Patient Instructions (Addendum)
Medication Instructions:  Your physician recommends that you continue on your current medications as directed. Please refer to the Current Medication list given to you today. 1.  START Chlorthalidone 25 mg taking 1 tablet daily   Labwork: 1 WEEK:  BMET  Testing/Procedures: None ordered  Follow-Up: Your physician recommends that you schedule a follow-up appointment in: 2 [place]   Any Other Special Instructions Will Be Listed Below (If Applicable).     If you need a refill on your cardiac medications before your next appointment, please call your pharmacy.

## 2017-10-10 ENCOUNTER — Encounter: Payer: Self-pay | Admitting: Physician Assistant

## 2017-10-11 DIAGNOSIS — M47817 Spondylosis without myelopathy or radiculopathy, lumbosacral region: Secondary | ICD-10-CM | POA: Insufficient documentation

## 2017-10-11 DIAGNOSIS — D509 Iron deficiency anemia, unspecified: Secondary | ICD-10-CM | POA: Insufficient documentation

## 2017-10-11 DIAGNOSIS — I2699 Other pulmonary embolism without acute cor pulmonale: Secondary | ICD-10-CM | POA: Insufficient documentation

## 2017-10-11 DIAGNOSIS — T4145XA Adverse effect of unspecified anesthetic, initial encounter: Secondary | ICD-10-CM | POA: Insufficient documentation

## 2017-10-11 DIAGNOSIS — I480 Paroxysmal atrial fibrillation: Secondary | ICD-10-CM | POA: Insufficient documentation

## 2017-10-11 DIAGNOSIS — C801 Malignant (primary) neoplasm, unspecified: Secondary | ICD-10-CM | POA: Insufficient documentation

## 2017-10-11 DIAGNOSIS — I340 Nonrheumatic mitral (valve) insufficiency: Secondary | ICD-10-CM | POA: Insufficient documentation

## 2017-10-11 DIAGNOSIS — E785 Hyperlipidemia, unspecified: Secondary | ICD-10-CM | POA: Insufficient documentation

## 2017-10-11 DIAGNOSIS — Z8719 Personal history of other diseases of the digestive system: Secondary | ICD-10-CM | POA: Insufficient documentation

## 2017-10-11 DIAGNOSIS — T8859XA Other complications of anesthesia, initial encounter: Secondary | ICD-10-CM | POA: Insufficient documentation

## 2017-10-11 DIAGNOSIS — I779 Disorder of arteries and arterioles, unspecified: Secondary | ICD-10-CM | POA: Insufficient documentation

## 2017-10-11 DIAGNOSIS — K219 Gastro-esophageal reflux disease without esophagitis: Secondary | ICD-10-CM | POA: Insufficient documentation

## 2017-10-11 DIAGNOSIS — Z8489 Family history of other specified conditions: Secondary | ICD-10-CM | POA: Insufficient documentation

## 2017-10-11 DIAGNOSIS — I493 Ventricular premature depolarization: Secondary | ICD-10-CM | POA: Insufficient documentation

## 2017-10-11 DIAGNOSIS — I2510 Atherosclerotic heart disease of native coronary artery without angina pectoris: Secondary | ICD-10-CM | POA: Insufficient documentation

## 2017-10-11 DIAGNOSIS — I739 Peripheral vascular disease, unspecified: Secondary | ICD-10-CM

## 2017-10-11 DIAGNOSIS — D6861 Antiphospholipid syndrome: Secondary | ICD-10-CM | POA: Insufficient documentation

## 2017-10-11 DIAGNOSIS — I82409 Acute embolism and thrombosis of unspecified deep veins of unspecified lower extremity: Secondary | ICD-10-CM | POA: Insufficient documentation

## 2017-10-11 DIAGNOSIS — I10 Essential (primary) hypertension: Secondary | ICD-10-CM | POA: Insufficient documentation

## 2017-10-11 DIAGNOSIS — N329 Bladder disorder, unspecified: Secondary | ICD-10-CM | POA: Insufficient documentation

## 2017-10-11 DIAGNOSIS — L814 Other melanin hyperpigmentation: Secondary | ICD-10-CM | POA: Insufficient documentation

## 2017-10-11 DIAGNOSIS — Z889 Allergy status to unspecified drugs, medicaments and biological substances status: Secondary | ICD-10-CM | POA: Insufficient documentation

## 2017-10-16 ENCOUNTER — Other Ambulatory Visit: Payer: Medicare Other | Admitting: *Deleted

## 2017-10-16 DIAGNOSIS — I10 Essential (primary) hypertension: Secondary | ICD-10-CM | POA: Diagnosis not present

## 2017-10-16 DIAGNOSIS — I2510 Atherosclerotic heart disease of native coronary artery without angina pectoris: Secondary | ICD-10-CM | POA: Diagnosis not present

## 2017-10-16 DIAGNOSIS — I493 Ventricular premature depolarization: Secondary | ICD-10-CM

## 2017-10-16 DIAGNOSIS — I480 Paroxysmal atrial fibrillation: Secondary | ICD-10-CM

## 2017-10-16 DIAGNOSIS — E785 Hyperlipidemia, unspecified: Secondary | ICD-10-CM | POA: Diagnosis not present

## 2017-10-16 DIAGNOSIS — I6523 Occlusion and stenosis of bilateral carotid arteries: Secondary | ICD-10-CM | POA: Diagnosis not present

## 2017-10-16 DIAGNOSIS — I340 Nonrheumatic mitral (valve) insufficiency: Secondary | ICD-10-CM

## 2017-10-17 ENCOUNTER — Telehealth: Payer: Self-pay | Admitting: *Deleted

## 2017-10-17 DIAGNOSIS — I10 Essential (primary) hypertension: Secondary | ICD-10-CM

## 2017-10-17 LAB — BASIC METABOLIC PANEL
BUN / CREAT RATIO: 22 (ref 12–28)
BUN: 19 mg/dL (ref 8–27)
CO2: 30 mmol/L — ABNORMAL HIGH (ref 20–29)
CREATININE: 0.86 mg/dL (ref 0.57–1.00)
Calcium: 10.1 mg/dL (ref 8.7–10.3)
Chloride: 88 mmol/L — ABNORMAL LOW (ref 96–106)
GFR calc Af Amer: 79 mL/min/{1.73_m2} (ref >59)
GFR, EST NON AFRICAN AMERICAN: 69 mL/min/{1.73_m2} (ref >59)
Glucose: 205 mg/dL — ABNORMAL HIGH (ref 65–99)
Potassium: 3 mmol/L — ABNORMAL LOW (ref 3.5–5.2)
SODIUM: 137 mmol/L (ref 134–144)

## 2017-10-17 MED ORDER — POTASSIUM CHLORIDE CRYS ER 20 MEQ PO TBCR: 40.0000 meq | EXTENDED_RELEASE_TABLET | ORAL | 3 refills | Status: DC

## 2017-10-17 MED ORDER — CHLORTHALIDONE 25 MG PO TABS: 12.5000 mg | ORAL_TABLET | Freq: Every day | ORAL | 3 refills | Status: DC

## 2017-10-17 NOTE — Telephone Encounter (Signed)
Pt has been notified of lab results. Pt advised of recommendation per Dr. Angelena Form to start K+, with directions to take 40 meq x 2 doses on day 1; then continue on K+ 40 meq daily; decrease Chlorthalidone to 12.5 mg daily, Rx's have been sent in to [facility]. Bmet 10/24/17. Pt states she was supposed to be set up with the HTN clinic though no appt was made for this. Pt said she is scheduled to see Melina Copa, PA 10/29/17. I explained to the pt that it was probably faster getting an appt with the PA as to I think the HTN was booked out a bit. I advised pt I will touch base with Melina Copa, PA as well as to the HTN Clinic. Advised pt PA will probably wait and reassess when she see's her on 10/29/17.

## 2017-10-17 NOTE — Telephone Encounter (Signed)
Rx K+ was called into [facility] to [employer].

## 2017-10-17 NOTE — Telephone Encounter (Signed)
-----   Message from Charlie Pitter, [place] sent at 10/17/2017  7:47 AM EST ----- Please let patient know labs show potassium is low on new BP medication. Please rx KCl 24meq x 2 doses on day one, then 43meq daily. Cut chlorthalidone down to 12.5mg  daily. Repeat BMET 5-7 days. Thanks. Keep plan for f/u BP check. Dayna Dunn PA-C

## 2017-10-24 ENCOUNTER — Other Ambulatory Visit: Payer: Medicare Other | Admitting: *Deleted

## 2017-10-24 DIAGNOSIS — I10 Essential (primary) hypertension: Secondary | ICD-10-CM | POA: Diagnosis not present

## 2017-10-25 LAB — BASIC METABOLIC PANEL
BUN/Creatinine Ratio: 21 (ref 12–28)
BUN: 17 mg/dL (ref 8–27)
CO2: 26 mmol/L (ref 20–29)
Calcium: 9.9 mg/dL (ref 8.7–10.3)
Chloride: 95 mmol/L — ABNORMAL LOW (ref 96–106)
Creatinine, Ser: 0.81 mg/dL (ref 0.57–1.00)
GFR, EST AFRICAN AMERICAN: 85 mL/min/{1.73_m2} (ref >59)
GFR, EST NON AFRICAN AMERICAN: 74 mL/min/{1.73_m2} (ref >59)
Glucose: 120 mg/dL — ABNORMAL HIGH (ref 65–99)
POTASSIUM: 3.5 mmol/L (ref 3.5–5.2)
Sodium: 141 mmol/L (ref 134–144)

## 2017-10-28 ENCOUNTER — Telehealth: Payer: Self-pay | Admitting: *Deleted

## 2017-10-28 MED ORDER — POTASSIUM CHLORIDE CRYS ER 20 MEQ PO TBCR: 40.0000 meq | EXTENDED_RELEASE_TABLET | Freq: Two times a day (BID) | ORAL | 6 refills | Status: DC

## 2017-10-28 NOTE — Telephone Encounter (Signed)
-----   Message from Charlie Pitter, [place] sent at 10/25/2017  8:11 AM EST ----- Please let patient know labs are stable, potassium improved. If she's definitely taking her KCl 56meq daily, please increase this to BID. Keep f/u as planned. Dayna Dunn PA-C

## 2017-10-28 NOTE — Telephone Encounter (Signed)
-----   Message from Charlie Pitter, [place] sent at 10/25/2017  8:11 AM EST ----- Please let patient know labs are stable, potassium improved. If she's definitely taking her KCl 19meq daily, please increase this to BID. Keep f/u as planned. Dayna Dunn PA-C

## 2017-10-28 NOTE — Progress Notes (Signed)
Cardiology Office Note    Date:  10/29/2017  ID:  Latoya [name], DOB 1946/04/25, MRN [id number] PCP:  Darreld Mclean, MD  Cardiologist:  Dr. 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  Last cath was in 2003 with moderate CAD (30% ostial LM stenosis, 50% mid LAD, 80% moderate sized Diagonal, 80% Circumflex). Most recent stress test October 2013 showed apical thinning without obvious ischemia, lateral wall uninterpretable due to large bowel artifact, EF 47%. Carotid duplex in 03/2017 showed 1-39% bilateral internal carotid stenosis (high end range on left), >50% LECA stenosis, with recommendation for f/u duplex 03/2018. She had complex admission 05/2016 with sepsis, ascending cholangitis complicated by respiratory failure, renal failure, ?possible CHF. Discharge summary indicates combined HF although LVEF was 50% and Lasix was discontinued per notes due to no crackles or edema. She underwent lap cholecystectomy 10/04/16. At f/u on 09/23/17 she was doing great without any symptoms other than chronic back pain. She had not been exercising as usual due to her usual pool location being out of commission. Her blood pressure was running 180/88. She requested slow titration of meds due to prior history of fainting with aggressive titration back in 2003 prior to dx of CAD. EKG also incidentally noted frequent PVCs which was new. Her metoprolol was increased to 100mg  BID. At f/u visit 10/07/17, blood pressure was still high so chlorthalidone was added with slow introduction. Initial f/u BMET showed low potassium and elevated CO2 so we  cut chlorthalidone in half and added potassium. F/u BMET 10/24/16 showed K 3.5, Cr 0.81 (KCl increased to 12meq BID given h/o PVCs). Recent labs otherwise showed 09/23/17 showed Hgb 14.7, Mg 2.2, normal TSH. 2D echo 10/04/17 showed EF 50-55%, grade 2 DD, trivial MR.  She returns for follow-up feeling well on her current regimen. She has a sense of overall wellbeing with improved blood pressures in the 035W systolic at home. Her shoes fit better and her legs are not as tight. No dyspnea, chest pain, or palpitations.   Past Medical History:  Diagnosis Date  . Anemia, iron deficiency   . Antiphospholipid syndrome (HCC)    with hypercoagulable state  . Asthma    extrinsic; moderate, persistant, nml spirometry 2010, nml CXR 1/08  . Bladder troubles    REPORTS INFECTIONS ON OCCASION DUE TO URETHRA MEATUS STRICTURE AT BIRTH   . CAD (coronary artery disease)    a. 50% mid LAD, 80% ostial D1 and moderate 80% mid circ by cath 2003. b. nonischemic nuc in 2013.  Marland Kitchen Cancer ([place])    basal cell removed fr. L arm   . Carotid arterial disease ([place])    a. 03/2017 showed 1-39% bilateral internal carotid stenosis (high end range on left), >50% LECA stenosis, with recommendation for f/u duplex 03/2018.  Marland Kitchen Complication of anesthesia    cardiac arrest- in OR, at age 48 (7)y.o. during Scalenotomy in her early 20's  . Drug allergy    heparin/lovenox  . DVT (deep venous thrombosis) ([place])   . Erosive gastritis 1994  . Family history of adverse reaction to anesthesia    some family members have had trouble waking up  .  GERD (gastroesophageal reflux disease)   . H/O hiatal hernia   . HLD (hyperlipidemia)   . HTN (hypertension)    Pt states her high blood pressure readings are due tot he method of measurementsMcAlhaney at [employer], manages pt.  LOV 03/2014  . Hypothyroidism   . Liver spot    cyst- - no biopsy, but told that its benign   . Moderate mitral regurgitation   . PAF (paroxysmal atrial fibrillation) ([place])    . Pulmonary embolism ([place])    related to back surgery with prior coumadin use, now off  . PVC's (premature ventricular contractions)    a. noted in 09/2017.  Marland Kitchen Spondylosis, lumbosacral    ARTHRITIS- OA     Past Surgical History:  Procedure Laterality Date  . ABDOMINAL HYSTERECTOMY     partial abdominal -1998  . BACK SURGERY     x13 cervical and lumbar thoracic spine surgery  . BREAST ENHANCEMENT SURGERY    . BREAST SURGERY     all benign cysts x3   . CARDIAC CATHETERIZATION     2005  . CHOLECYSTECTOMY N/A 10/04/2016   Procedure: LAPAROSCOPIC CHOLECYSTECTOMY;  Surgeon: Erroll Luna, MD;  Location: [facility] OR;  Service: General;  Laterality: N/A;  . cleft lip and palate repair     72 yo   . hysterectomy    . IR GENERIC HISTORICAL  06/04/2016   IR PERC CHOLECYSTOSTOMY 06/04/2016 Greggory Keen, MD [facility]INTER[facility]  . IR GENERIC HISTORICAL  08/06/2016   IR CHOLANGIOGRAM EXISTING TUBE 08/06/2016 Aletta Edouard, MD [facility]  . IR GENERIC HISTORICAL  07/17/2016   IR RADIOLOGIST EVAL & MGMT 07/17/2016 [facility] INTER[facility] RAD  . JOINT REPLACEMENT    . POSTERIOR CERVICAL FUSION/FORAMINOTOMY Right 08/31/2014   Procedure: Right cervical six-seven foraminotomy, Cervical five-Thoracic one fusion, Cervical six-seven lateral mass screws;  Surgeon: Erline Levine, MD;  Location: [place] [facility];  Service: Neurosurgery;  Laterality: Right;  . spina bifida repair     72 yo   . TONSILLECTOMY    . TOTAL KNEE ARTHROPLASTY     left    Current Medications: Current Meds  Medication Sig  . albuterol (PROVENTIL HFA;VENTOLIN HFA) 108 (90 BASE) MCG/ACT inhaler Inhale 2 puffs into the lungs every 6 (six) hours as needed for wheezing or shortness of breath.   Marland Kitchen aspirin 81 MG EC tablet Take 81 mg by mouth daily.   Marland Kitchen atorvastatin (LIPITOR) 40 MG tablet Take 1 tablet (40 mg total) by mouth daily.  Marland Kitchen azelastine (ASTELIN) 137 MCG/SPRAY nasal spray Place 1 spray into the nose 2 (two) times daily.   . beclomethasone (QVAR)  80 MCG/ACT inhaler Inhale 2 puffs into the lungs 3 (three) times daily.  . Calcium Carb-Cholecalciferol (CALCIUM 500 +D) 500-400 MG-UNIT TABS Take 1 tablet by mouth 2 (two) times daily.  . chlorthalidone (HYGROTON) 25 MG tablet Take 0.5 tablets (12.5 mg total) by mouth daily.  . cyclobenzaprine (FLEXERIL) 10 MG tablet Take 10 mg by mouth 3 (three) times daily as needed for muscle spasms.  Marland Kitchen docusate sodium (COLACE) 100 MG capsule Take 200 mg by mouth 2 (two) times daily.   Marland Kitchen EPINEPHrine 0.3 mg/0.3 mL IJ SOAJ injection Inject 0.3 mg into the muscle once. As needed for anaphylaxis  . esomeprazole (NEXIUM) 40 MG capsule Take 40 mg by mouth at bedtime.   . gabapentin (NEURONTIN) 600 MG tablet Take 300-600 mg by mouth at bedtime.   . Glucosamine-Chondroitin 750-600 MG TABS Take 2 tablets by mouth 2 (  two) times daily.   Marland Kitchen levothyroxine (SYNTHROID, LEVOTHROID) 75 MCG tablet Take 1 tablet (75 mcg total) by mouth daily before breakfast.  . Loratadine-Pseudoephedrine (CLARITIN-D 12 HOUR PO) Take 1 tablet by mouth 2 (two) times daily.   . Melatonin 5 MG TABS Take 2.5-5 mg by mouth at bedtime as needed.  . metaxalone (SKELAXIN) 800 MG tablet Take 800 mg by mouth every 8 (eight) hours as needed. For muscle pain  . metoprolol tartrate (LOPRESSOR) 100 MG tablet Take 1 tablet (100 mg total) by mouth 2 (two) times daily.  . Multiple Vitamin (MULTIVITAMIN WITH MINERALS) TABS Take 1 tablet by mouth daily.  . nitroGLYCERIN (NITROSTAT) 0.4 MG SL tablet DISSOLVE 1 TABLET UNDER TONGUE AS NEEDED FOR CHEST PAIN,MAY REPEAT IN5 MINUTES FOR 2 DOSES.  Marland Kitchen potassium chloride (K-DUR) 20 MEQ tablet Take 2 tablets (40 mEq total) by mouth 2 (two) times daily.  Marland Kitchen Respiratory Therapy Supplies (FLUTTER) DEVI Use as directed  . triamcinolone (NASACORT) 55 MCG/ACT nasal inhaler Place 1-2 sprays into the nose at bedtime.   Alveda Reasons 20 MG TABS tablet TAKE 1 TABLET ONCE A DAY WITH SUPPER.    Allergies:   Chlorhexidine; Enoxaparin  sodium; Heparin; Hornet venom; Other; Pork-derived products; Povidone-iodine; Indomethacin; Lactose intolerance (gi); and Phenylpropanolamine   Social History   Socioeconomic History  . Marital status: Married    Spouse name: None  . Number of children: None  . Years of education: None  . Highest education level: None  Social Needs  . Financial resource strain: None  . Food insecurity - worry: None  . Food insecurity - inability: None  . Transportation needs - medical: None  . Transportation needs - non-medical: None  Occupational History  . None  Tobacco Use  . Smoking status: Never Smoker  . Smokeless tobacco: Never Used  . Tobacco comment: no smoking   Substance and Sexual Activity  . Alcohol use: No  . Drug use: No  . Sexual activity: None  Other Topics Concern  . None  Social History Narrative   Lives in [place] with husband; has had miscarriages but no children.    Disabled but exercises nearly every day (can only exericse in water - aerobics)   Takes opioids for pain relief; takes no herbal medications; has a heart healthy diet.      Family History:  Family History  Problem Relation Age of Onset  . Heart disease Father        MI   . Heart disease Sister     ROS:   Please see the history of present illness.  All other systems are reviewed and otherwise negative.    PHYSICAL EXAM:   VS:  BP 132/72   Pulse 85   Ht 5\' 2"  (1.575 m)   Wt 193 lb (87.5 kg)   SpO2 96%   BMI 35.30 kg/m   BMI: Body mass index is 35.3 kg/m. GEN: Well nourished, well developed elderly WF in no acute distress  HEENT: normocephalic, atraumatic Neck: no JVD, carotid bruits, or masses Cardiac: RRR; no murmurs, rubs, or gallops, soft trace pedal edema bilaterally (large baseline leg habitus) Respiratory:  clear to auscultation bilaterally, normal work of breathing GI: soft, nontender, nondistended, + BS MS: kyphotic posture Skin: warm and dry, no rash Neuro:  Alert and Oriented x 3,  Strength and sensation are intact, follows commands Psych: euthymic mood, full affect  Wt Readings from Last 3 Encounters:  10/29/17 193 lb (87.5 kg)  10/07/17  193 lb (87.5 kg)  09/23/17 192 lb 12.8 oz (87.5 kg)      Studies/Labs Reviewed:   EKG:  EKG was not ordered today  Recent Labs: 11/21/2016: ALT 17 09/23/2017: Hemoglobin 14.7; Magnesium 2.2; Platelets 249; TSH 1.400 10/24/2017: BUN 17; Creatinine, Ser 0.81; Potassium 3.5; Sodium 141   Lipid Panel    Component Value Date/Time   CHOL 170 12/21/2015   TRIG 211 (H) 06/07/2016 0415   HDL 59 12/21/2015   CHOLHDL 4 08/05/2009 0935   VLDL 51.8 (H) 08/05/2009 0935   LDLDIRECT 98.6 08/05/2009 0935    Additional studies/ records that were reviewed today include: Summarized above.    ASSESSMENT & PLAN:   1. Essential HTN - blood pressure much improved on increased metoprolol and chlorthalidone. Metoprolol titration was chosen previously due to PVCs, and chlorthalidone was chosen due to her chronic edema and diastolic dysfunction. (Amlodipine was felt to be less ideal to start with chronic lower extremity edema and history of symptoms of venous insufficiency.) She feels well on the current combination. Continue present regimen. She has had some hypokalemia with the chlorthalidone, prompting dose reduction. She reports dissatisfaction with KCl 41meq tablets. Will switch to the 10 meq tablets to see if they are smaller at her pharmacy. Will continue 40mg  BID for now with recheck in 4-7 days (just started yesterday). If hypokalemia remains an issue, can consider changing diuretic to spironolactone instead.  2. CAD - no recent angina. Dr. Angelena Form has maintained her on aspirin long-term. She is also on statin and beta blocker. 3. Paroxysmal atrial fib - no recent palpitations to suggest recurrence. Continue Xarelto. Recent creatinine and hemoglobin were stable. No evidence of bleeding. 4. Carotid artery disease - will be due for repeat  duplex around 03/2018. Since she'll be seeing Dr. Angelena Form around that time, will defer to him to arrange in follow-up if still appropriate at that time. 5. Hyperlipidemia - followed by primary care. Recommend goal LDL <70. 6. PVCs - quiescent on higher beta blocker dose. LVEF normal.  Disposition: F/u with Dr. Angelena Form in May/June 2019.   Medication Adjustments/Labs and Tests Ordered: Current medicines are reviewed at length with the patient today.  Concerns regarding medicines are outlined above. Medication changes, Labs and Tests ordered today are summarized above and listed in the Patient Instructions accessible in Encounters.   Signed, Charlie Pitter, PA-C  10/29/2017 2:51 PM    [facility] [place], [place], [place]  [address] Phone: [phone]; Fax: [phone]

## 2017-10-28 NOTE — Telephone Encounter (Signed)
-----   Message from Latoya [name], [place] sent at 10/25/2017  8:11 AM EST ----- Please let patient know labs are stable, potassium improved. If she's definitely taking her KCl 56meq daily, please increase this to BID. Keep f/u as planned. Dayna Dunn PA-C

## 2017-10-29 ENCOUNTER — Encounter: Payer: Self-pay | Admitting: Physician Assistant

## 2017-10-29 ENCOUNTER — Ambulatory Visit (INDEPENDENT_AMBULATORY_CARE_PROVIDER_SITE_OTHER): Payer: Medicare Other | Admitting: Physician Assistant

## 2017-10-29 VITALS — BP 132/72 | HR 85 | Ht 62.0 in | Wt 193.0 lb

## 2017-10-29 DIAGNOSIS — R002 Palpitations: Secondary | ICD-10-CM

## 2017-10-29 DIAGNOSIS — I779 Disorder of arteries and arterioles, unspecified: Secondary | ICD-10-CM | POA: Diagnosis not present

## 2017-10-29 DIAGNOSIS — I10 Essential (primary) hypertension: Secondary | ICD-10-CM | POA: Diagnosis not present

## 2017-10-29 DIAGNOSIS — I2510 Atherosclerotic heart disease of native coronary artery without angina pectoris: Secondary | ICD-10-CM

## 2017-10-29 DIAGNOSIS — I480 Paroxysmal atrial fibrillation: Secondary | ICD-10-CM

## 2017-10-29 DIAGNOSIS — E785 Hyperlipidemia, unspecified: Secondary | ICD-10-CM | POA: Diagnosis not present

## 2017-10-29 DIAGNOSIS — I493 Ventricular premature depolarization: Secondary | ICD-10-CM | POA: Diagnosis not present

## 2017-10-29 DIAGNOSIS — I739 Peripheral vascular disease, unspecified: Secondary | ICD-10-CM

## 2017-10-29 MED ORDER — POTASSIUM CHLORIDE ER 10 MEQ PO TBCR: 40.0000 meq | EXTENDED_RELEASE_TABLET | Freq: Two times a day (BID) | ORAL | 3 refills | Status: DC

## 2017-10-29 NOTE — Patient Instructions (Signed)
Medication Instructions:  Your physician has recommended you make the following change in your medication:  1.  CHANGE the Potassium to 10 meq taking 4 tablets twice a day   Labwork: 4-7 DAYS:  BMET   Testing/Procedures: None ordered  Follow-Up: Your physician recommends that you schedule a follow-up appointment in: 3-4 MONTHS WITH DR. Angelena Form   Any Other Special Instructions Will Be Listed Below (If Applicable).     If you need a refill on your cardiac medications before your next appointment, please call your pharmacy.

## 2017-10-30 ENCOUNTER — Telehealth: Payer: Self-pay | Admitting: Physician Assistant

## 2017-10-30 NOTE — Telephone Encounter (Signed)
Pt is calling requesting a 90 day supply of potassium chloride 10 meq. Could you prescribe potassium chloride 20 meq, so pt would not have to take so many pills. Please address

## 2017-10-30 NOTE — Telephone Encounter (Signed)
New Message   *STAT* If patient is at the pharmacy, call can be transferred to refill team.   1. Which medications need to be refilled? (please list name of each medication and dose if known) potassium capsules   2. Which pharmacy/location (including street and city if local pharmacy) is medication to be sent to? [facility]   3. Do they need a 30 day or 90 day supply? [place]

## 2017-10-31 ENCOUNTER — Other Ambulatory Visit: Payer: Self-pay | Admitting: Physician Assistant

## 2017-10-31 MED ORDER — POTASSIUM CHLORIDE ER 10 MEQ PO TBCR: 40.0000 meq | EXTENDED_RELEASE_TABLET | Freq: Two times a day (BID) | ORAL | 3 refills | Status: DC

## 2017-10-31 NOTE — Telephone Encounter (Signed)
Pt's medication was resent to pt's pharmacy because it was a end date that only allows pt to get the medication for 90 days. I resent this medication in again, taking the end date out so pt would be able to get medication for the year. Confirmation received

## 2017-10-31 NOTE — Telephone Encounter (Signed)
Pt's medication was resent to pt's pharmacy because it was a end date that only allows pt to get the medication for 90 days. I resent this medication in again, taking the end date out so pt would be able to get medication for the year. Confirmation received.

## 2017-11-01 ENCOUNTER — Encounter: Payer: Self-pay | Admitting: Physician Assistant

## 2017-11-01 MED ORDER — POTASSIUM CHLORIDE ER 10 MEQ PO TBCR: 40.0000 meq | EXTENDED_RELEASE_TABLET | Freq: Two times a day (BID) | ORAL | 3 refills | Status: DC

## 2017-11-01 NOTE — Addendum Note (Signed)
Addended by: Derl Barrow on: 11/01/2017 09:23 AM   Modules accepted: Orders

## 2017-11-05 ENCOUNTER — Other Ambulatory Visit: Payer: Medicare Other | Admitting: *Deleted

## 2017-11-05 DIAGNOSIS — R002 Palpitations: Secondary | ICD-10-CM

## 2017-11-05 DIAGNOSIS — E785 Hyperlipidemia, unspecified: Secondary | ICD-10-CM | POA: Diagnosis not present

## 2017-11-05 DIAGNOSIS — I10 Essential (primary) hypertension: Secondary | ICD-10-CM | POA: Diagnosis not present

## 2017-11-05 DIAGNOSIS — I779 Disorder of arteries and arterioles, unspecified: Secondary | ICD-10-CM | POA: Diagnosis not present

## 2017-11-05 DIAGNOSIS — I739 Peripheral vascular disease, unspecified: Secondary | ICD-10-CM

## 2017-11-05 DIAGNOSIS — I480 Paroxysmal atrial fibrillation: Secondary | ICD-10-CM | POA: Diagnosis not present

## 2017-11-05 DIAGNOSIS — I2510 Atherosclerotic heart disease of native coronary artery without angina pectoris: Secondary | ICD-10-CM

## 2017-11-05 DIAGNOSIS — I493 Ventricular premature depolarization: Secondary | ICD-10-CM

## 2017-11-06 LAB — BASIC METABOLIC PANEL
BUN/Creatinine Ratio: 21 (ref 12–28)
BUN: 18 mg/dL (ref 8–27)
CALCIUM: 10.3 mg/dL (ref 8.7–10.3)
CO2: 22 mmol/L (ref 20–29)
CREATININE: 0.86 mg/dL (ref 0.57–1.00)
Chloride: 96 mmol/L (ref 96–106)
GFR calc Af Amer: 79 mL/min/{1.73_m2} (ref >59)
GFR, EST NON AFRICAN AMERICAN: 69 mL/min/{1.73_m2} (ref >59)
Glucose: 170 mg/dL — ABNORMAL HIGH (ref 65–99)
Potassium: 4.1 mmol/L (ref 3.5–5.2)
Sodium: 141 mmol/L (ref 134–144)

## 2017-11-13 DIAGNOSIS — M4722 Other spondylosis with radiculopathy, cervical region: Secondary | ICD-10-CM | POA: Diagnosis not present

## 2017-11-13 DIAGNOSIS — G894 Chronic pain syndrome: Secondary | ICD-10-CM | POA: Diagnosis not present

## 2017-11-13 DIAGNOSIS — M963 Postlaminectomy kyphosis: Secondary | ICD-10-CM | POA: Diagnosis not present

## 2017-11-13 DIAGNOSIS — Z79891 Long term (current) use of opiate analgesic: Secondary | ICD-10-CM | POA: Diagnosis not present

## 2017-11-18 ENCOUNTER — Telehealth: Payer: Self-pay | Admitting: Cardiovascular Disease

## 2017-11-18 NOTE — Telephone Encounter (Signed)
   Primary Cardiologist: Dr. Angelena Form  Chart reviewed as part of pre-operative protocol coverage. Given past medical history and time since last visit (10/29/17 seen by Melina Copa and stable from cardiac standpoint), based on ACC/AHA guidelines, Katelynne Revak Mastrogiovanni would be at acceptable risk for the planned procedure without further cardiovascular testing. Tooth extraction. Will route to pharmacy to address Xarelto.    [name], PA-C 11/18/2017, 4:30 PM

## 2017-11-18 NOTE — Telephone Encounter (Signed)
° °  [facility] Pre-operative Risk Assessment    Request for surgical clearance: 1. 3 Extractions  2. When is this surgery scheduled? 12-02-17   3. What type of clearance is required (medical clearance vs. Pharmacy clearance to hold med vs. Both)?   4. Are there any medications that need to be held prior to surgery and how long?Does she need to stop her blood thinner and Aspirin? If so,how long?   5. Practice name and name of physician performing surgery? Dr Purnell Shoemaker   6. What is your office phone and fax number?[phone] and fax is [phone]   7. Anesthesia type (None, local, MAC, general) ?No   Latoya [name] 11/18/2017, 3:48 PM  _________________________________________________________________   (provider comments below)

## 2017-11-18 NOTE — Telephone Encounter (Signed)
Patient with diagnosis of Afib with history of PE/DVT and hypercoagulable state with  on Xarelto for anticoagulation.    Procedure: dental extractions (3) Date of procedure: 12/02/17  CHADS2-VASc score of  5 (CHF, HTN, AGE, DM2 (preDM), stroke/tia x 2, CAD, AGE, female)  CrCl 16ml/min  Per office protocol, patient can hold Xarelto for 24 hours prior to procedure would recommend resume ASAP after procedure given history of multiple VTE and hypercoagulable state.

## 2017-11-19 NOTE — Telephone Encounter (Signed)
  Chart reviewed as part of pre-operative protocol coverage.  I will route this recommendation to the requesting party via Epic fax function and remove from pre-op pool.  Please call with questions.  [place], [place] 11/19/2017, 12:17 PM

## 2017-11-22 ENCOUNTER — Ambulatory Visit (HOSPITAL_BASED_OUTPATIENT_CLINIC_OR_DEPARTMENT_OTHER)
Admission: RE | Admit: 2017-11-22 | Discharge: 2017-11-22 | Disposition: A | Discharge status: Home / Self Care | Payer: Medicare Other | Source: Ambulatory Visit | Attending: Internal Medicine | Admitting: Internal Medicine

## 2017-11-22 ENCOUNTER — Encounter: Payer: Self-pay | Admitting: Internal Medicine

## 2017-11-22 ENCOUNTER — Ambulatory Visit (INDEPENDENT_AMBULATORY_CARE_PROVIDER_SITE_OTHER): Payer: Medicare Other | Admitting: Internal Medicine

## 2017-11-22 VITALS — BP 128/68 | HR 68 | Temp 98.1°F | Resp 16 | Ht 62.0 in | Wt 193.4 lb

## 2017-11-22 DIAGNOSIS — R05 Cough: Secondary | ICD-10-CM | POA: Insufficient documentation

## 2017-11-22 DIAGNOSIS — J9811 Atelectasis: Secondary | ICD-10-CM | POA: Insufficient documentation

## 2017-11-22 DIAGNOSIS — I2510 Atherosclerotic heart disease of native coronary artery without angina pectoris: Secondary | ICD-10-CM

## 2017-11-22 DIAGNOSIS — J4521 Mild intermittent asthma with (acute) exacerbation: Secondary | ICD-10-CM

## 2017-11-22 DIAGNOSIS — R059 Cough, unspecified: Secondary | ICD-10-CM

## 2017-11-22 MED ORDER — DOXYCYCLINE HYCLATE 100 MG PO TABS: 100.0000 mg | ORAL_TABLET | Freq: Two times a day (BID) | ORAL | 0 refills | Status: DC

## 2017-11-22 MED ORDER — BECLOMETHASONE DIPROP HFA 80 MCG/ACT IN AERB: 2.0000 | INHALATION_SPRAY | Freq: Three times a day (TID) | RESPIRATORY_TRACT | 5 refills | Status: DC

## 2017-11-22 MED ORDER — ALBUTEROL SULFATE HFA 108 (90 BASE) MCG/ACT IN AERS: 2.0000 | INHALATION_SPRAY | Freq: Four times a day (QID) | RESPIRATORY_TRACT | 5 refills | Status: DC | PRN

## 2017-11-22 NOTE — Patient Instructions (Signed)
  STOP BY THE FIRST FLOOR:  get the XR    Rest, fluids , tylenol  For cough:  Take Mucinex DM twice a day as needed until better  For nasal congestion: Use OTC   Flonase : 2 nasal sprays on each side of the nose in the morning until you feel better   For chest congestion: Qvar daily Albuterol as needed  Avoid decongestants such as  Pseudoephedrine or phenylephrine    Take the antibiotic as prescribed  (DOXY)  Call if not gradually better over the next  10 days  Call anytime if the symptoms are severe, you have high fever, short of breath, chest pain

## 2017-11-22 NOTE — Progress Notes (Signed)
Pre visit review using our clinic review tool, if applicable. No additional management support is needed unless otherwise documented below in the visit note. 

## 2017-11-22 NOTE — Progress Notes (Signed)
Subjective:    Patient ID: Latoya [name], female    DOB: 06-18-1946, 72 y.o.   MRN: [id number]  DOS:  11/22/2017 Type of visit - description : ACUTE Interval history: Symptoms of started 3 days ago with sore throat, the next day developed cough, chest congestion, anterior chest soreness with cough. Some headaches. She is taking lots of fluids and Tylenol.  Also good compliance with Qvar.  Has not been taking albuterol, up to the last few days she did not feel she needed.    Review of Systems No fever, some chills. Mild shortness of breath, no lower extremity edema Some nausea, no vomiting or diarrhea + Sputum, light green in color, no hemoptysis  Past Medical History:  Diagnosis Date  . Anemia, iron deficiency   . Antiphospholipid syndrome (HCC)    with hypercoagulable state  . Asthma    extrinsic; moderate, persistant, nml spirometry 2010, nml CXR 1/08  . Bladder troubles    REPORTS INFECTIONS ON OCCASION DUE TO URETHRA MEATUS STRICTURE AT BIRTH   . CAD (coronary artery disease)    a. 50% mid LAD, 80% ostial D1 and moderate 80% mid circ by cath 2003. b. nonischemic nuc in 2013.  Marland Kitchen Cancer ([place])    basal cell removed fr. L arm   . Carotid arterial disease ([place])    a. 03/2017 showed 1-39% bilateral internal carotid stenosis (high end range on left), >50% LECA stenosis, with recommendation for f/u duplex 03/2018.  Marland Kitchen Complication of anesthesia    cardiac arrest- in OR, at age 51 (89)y.o. during Scalenotomy in her early 20's  . Drug allergy    heparin/lovenox  . DVT (deep venous thrombosis) ([place])   . Erosive gastritis 1994  . Family history of adverse reaction to anesthesia    some family members have had trouble waking up  . GERD (gastroesophageal reflux disease)   . H/O hiatal hernia   . HLD (hyperlipidemia)   . HTN (hypertension)    Pt states her high blood pressure readings are due tot he method of measurements[facility]aney at [employer], manages pt.  LOV 03/2014  .  Hypothyroidism   . Liver spot    cyst- - no biopsy, but told that its benign   . Moderate mitral regurgitation   . PAF (paroxysmal atrial fibrillation) ([place])   . Pulmonary embolism ([place])    related to back surgery with prior coumadin use, now off  . PVC's (premature ventricular contractions)    a. noted in 09/2017.  Marland Kitchen Spondylosis, lumbosacral    ARTHRITIS- OA     Past Surgical History:  Procedure Laterality Date  . ABDOMINAL HYSTERECTOMY     partial abdominal -1998  . BACK SURGERY     x13 cervical and lumbar thoracic spine surgery  . BREAST ENHANCEMENT SURGERY    . BREAST SURGERY     all benign cysts x3   . CARDIAC CATHETERIZATION     2005  . CHOLECYSTECTOMY N/A 10/04/2016   Procedure: LAPAROSCOPIC CHOLECYSTECTOMY;  Surgeon: Erroll Luna, MD;  Location: [facility] OR;  Service: General;  Laterality: N/A;  . cleft lip and palate repair     72 yo   . hysterectomy    . IR GENERIC HISTORICAL  06/04/2016   IR PERC CHOLECYSTOSTOMY 06/04/2016 Greggory Keen, MD [facility]  . IR GENERIC HISTORICAL  08/06/2016   IR CHOLANGIOGRAM EXISTING TUBE 08/06/2016 Aletta Edouard, MD [facility]  . IR GENERIC HISTORICAL  07/17/2016   IR RADIOLOGIST EVAL & MGMT  07/17/2016 [facility]  . JOINT REPLACEMENT    . POSTERIOR CERVICAL FUSION/FORAMINOTOMY Right 08/31/2014   Procedure: Right cervical six-seven foraminotomy, Cervical five-Thoracic one fusion, Cervical six-seven lateral mass screws;  Surgeon: Erline Levine, MD;  Location: [facility];  Service: Neurosurgery;  Laterality: Right;  . spina bifida repair     72 yo   . TONSILLECTOMY    . TOTAL KNEE ARTHROPLASTY     left    Social History   Socioeconomic History  . Marital status: Married    Spouse name: Not on file  . Number of children: Not on file  . Years of education: Not on file  . Highest education level: Not on file  Social Needs  . Financial resource strain: Not on file  . Food insecurity - worry: Not on file  . Food  insecurity - inability: Not on file  . Transportation needs - medical: Not on file  . Transportation needs - non-medical: Not on file  Occupational History  . Not on file  Tobacco Use  . Smoking status: Never Smoker  . Smokeless tobacco: Never Used  . Tobacco comment: no smoking   Substance and Sexual Activity  . Alcohol use: No  . Drug use: No  . Sexual activity: Not on file  Other Topics Concern  . Not on file  Social History Narrative   Lives in [place] with husband; has had miscarriages but no children.    Disabled but exercises nearly every day (can only exericse in water - aerobics)   Takes opioids for pain relief; takes no herbal medications; has a heart healthy diet.       Allergies as of 11/22/2017      Reactions   Chlorhexidine Anaphylaxis, Itching   Sensitive to dye in Betadine & Chlorohexadine    Enoxaparin Sodium Anaphylaxis   Heparin Anaphylaxis   Hornet Venom Anaphylaxis   [place] Hornets   Other Anaphylaxis, Itching   Sensitive to dye in Betadine & Chlorohexadine    Pork-derived Products Anaphylaxis   Povidone-iodine Itching, Rash   NOTE: Patient has had anaphylactic reaction to Betadine due to dyes in product. Sensitivity-" but if its wiped off she is able to tolerate betadine"    Indomethacin Rash   Lactose Intolerance (gi) Diarrhea, Nausea And Vomiting   Can tolerate most [place] cheeses (Takes Lactaid)   Phenylpropanolamine Hypertension      Medication List        Accurate as of 11/22/17 11:59 PM. Always use your most recent med list.          albuterol 108 (90 Base) MCG/ACT inhaler Commonly known as:  PROVENTIL HFA;VENTOLIN HFA Inhale 2 puffs into the lungs every 6 (six) hours as needed for wheezing or shortness of breath.   aspirin 81 MG EC tablet Take 81 mg by mouth daily.   ASTELIN 0.1 % nasal spray Generic drug:  azelastine Place 1 spray into the nose 2 (two) times daily.   atorvastatin 40 MG tablet Commonly known as:  LIPITOR Take 1  tablet (40 mg total) by mouth daily.   beclomethasone 80 MCG/ACT inhaler Commonly known as:  QVAR REDIHALER Inhale 2 puffs into the lungs 3 (three) times daily.   CALCIUM 500 +D 500-400 MG-UNIT Tabs Generic drug:  Calcium Carb-Cholecalciferol Take 1 tablet by mouth 2 (two) times daily.   chlorthalidone 25 MG tablet Commonly known as:  HYGROTON Take 0.5 tablets (12.5 mg total) by mouth daily.   CLARITIN-D 12 HOUR PO  Take 1 tablet by mouth 2 (two) times daily.   cyclobenzaprine 10 MG tablet Commonly known as:  FLEXERIL Take 10 mg by mouth 3 (three) times daily as needed for muscle spasms.   docusate sodium 100 MG capsule Commonly known as:  COLACE Take 200 mg by mouth 2 (two) times daily.   doxycycline 100 MG tablet Commonly known as:  VIBRA-TABS Take 1 tablet (100 mg total) by mouth 2 (two) times daily.   EPINEPHrine 0.3 mg/0.3 mL Soaj injection Commonly known as:  EPI-PEN Inject 0.3 mg into the muscle once. As needed for anaphylaxis   esomeprazole 40 MG capsule Commonly known as:  NEXIUM Take 40 mg by mouth at bedtime.   FLUTTER Devi Use as directed   gabapentin 600 MG tablet Commonly known as:  NEURONTIN Take 300-600 mg by mouth at bedtime.   Glucosamine-Chondroitin 750-600 MG Tabs Take 2 tablets by mouth 2 (two) times daily.   levothyroxine 75 MCG tablet Commonly known as:  SYNTHROID, LEVOTHROID Take 1 tablet (75 mcg total) by mouth daily before breakfast.   Melatonin 5 MG Tabs Take 2.5-5 mg by mouth at bedtime as needed.   metaxalone 800 MG tablet Commonly known as:  SKELAXIN Take 800 mg by mouth every 8 (eight) hours as needed. For muscle pain   metoprolol tartrate 100 MG tablet Commonly known as:  LOPRESSOR Take 1 tablet (100 mg total) by mouth 2 (two) times daily.   multivitamin with minerals Tabs tablet Take 1 tablet by mouth daily.   nitroGLYCERIN 0.4 MG SL tablet Commonly known as:  NITROSTAT DISSOLVE 1 TABLET UNDER TONGUE AS NEEDED FOR  CHEST PAIN,MAY REPEAT IN5 MINUTES FOR 2 DOSES.   potassium chloride 10 MEQ tablet Commonly known as:  K-DUR Take 4 tablets (40 mEq total) by mouth 2 (two) times daily.   triamcinolone 55 MCG/ACT nasal inhaler Commonly known as:  NASACORT Place 1-2 sprays into the nose at bedtime.   XARELTO 20 MG Tabs tablet Generic drug:  rivaroxaban TAKE 1 TABLET ONCE A DAY WITH SUPPER.          Objective:   Physical Exam BP 128/68 (BP Location: Left Arm, Patient Position: Sitting, Cuff Size: Normal)   Pulse 68   Temp 98.1 F (36.7 C) (Oral)   Resp 16   Ht 5\' 2"  (1.575 m)   Wt 193 lb 6 oz (87.7 kg)   BMI 35.37 kg/m  General:   Well developed, well nourished, sitting in a wheelchair, in no distress. Her hands are cold, we were not able to get O2 sat reading, states she has Raynaud's and that is not an uncommon occurrence. HEENT:  Normocephalic . Face symmetric, atraumatic. Nose congested. Lungs:  Few rhonchi, crackles, right base?  No distress, no increased work of breathing. Heart: RRR,  no murmur.  No pretibial edema bilaterally  Skin: Not pale. Not jaundice Neurologic:  alert & oriented X3.  Speech normal, gait not tested Psych--  Cognition and judgment appear intact.  Cooperative with normal attention span and concentration.  Behavior appropriate. No anxious or depressed appearing.      Assessment & Plan:   72 year old lady with multiple medical problems including CAD, HTN, PAD, asthma paroxysmal A. fib on Xarelto, PEs, DVTs, high cholesterol presents with Cough: Symptoms started 3 days ago, VSS, nontoxic appearing.  Question of crackles at the right base.  She has asthma, she seems congested but no wheezing today. Will recommend a chest x-ray to rule out pneumonia. Also doxycycline,  Qvar daily and albuterol as needed.  Refill sent. Prompt re-eval if not improving. Asthma exacerbation: see above

## 2017-11-26 ENCOUNTER — Telehealth: Payer: Self-pay | Admitting: Cardiovascular Disease

## 2017-12-05 ENCOUNTER — Other Ambulatory Visit: Payer: Self-pay | Admitting: Cardiovascular Disease

## 2017-12-19 ENCOUNTER — Other Ambulatory Visit: Payer: Self-pay | Admitting: Family Medicine

## 2017-12-19 DIAGNOSIS — E039 Hypothyroidism, unspecified: Secondary | ICD-10-CM

## 2017-12-26 DIAGNOSIS — G894 Chronic pain syndrome: Secondary | ICD-10-CM | POA: Diagnosis not present

## 2017-12-26 DIAGNOSIS — M4722 Other spondylosis with radiculopathy, cervical region: Secondary | ICD-10-CM | POA: Diagnosis not present

## 2017-12-26 DIAGNOSIS — M963 Postlaminectomy kyphosis: Secondary | ICD-10-CM | POA: Diagnosis not present

## 2017-12-26 DIAGNOSIS — Z79891 Long term (current) use of opiate analgesic: Secondary | ICD-10-CM | POA: Diagnosis not present

## 2018-01-08 DIAGNOSIS — Z85828 Personal history of other malignant neoplasm of skin: Secondary | ICD-10-CM | POA: Diagnosis not present

## 2018-01-08 DIAGNOSIS — D485 Neoplasm of uncertain behavior of skin: Secondary | ICD-10-CM | POA: Diagnosis not present

## 2018-01-08 DIAGNOSIS — C44519 Basal cell carcinoma of skin of other part of trunk: Secondary | ICD-10-CM | POA: Diagnosis not present

## 2018-01-08 DIAGNOSIS — L820 Inflamed seborrheic keratosis: Secondary | ICD-10-CM | POA: Diagnosis not present

## 2018-01-08 DIAGNOSIS — D225 Melanocytic nevi of trunk: Secondary | ICD-10-CM | POA: Diagnosis not present

## 2018-01-08 DIAGNOSIS — D1801 Hemangioma of skin and subcutaneous tissue: Secondary | ICD-10-CM | POA: Diagnosis not present

## 2018-01-08 DIAGNOSIS — L570 Actinic keratosis: Secondary | ICD-10-CM | POA: Diagnosis not present

## 2018-01-08 DIAGNOSIS — L821 Other seborrheic keratosis: Secondary | ICD-10-CM | POA: Diagnosis not present

## 2018-02-06 ENCOUNTER — Other Ambulatory Visit: Payer: Self-pay | Admitting: Cardiovascular Disease

## 2018-02-06 NOTE — Telephone Encounter (Signed)
Age 72 years Wt 87.7 kg  11/22/2017 Saw Dayna Dunn on 10/29/2017 Saw Dr Angelena Form on 03/14/2017 and has appt to see him 03/05/2018 11/05/2017 SrCr 0.86 09/23/2017 Hgb 14.7 HCT 43.1 CrCl 84.27 Refill done for Xarelto 20 mg daily as requested

## 2018-02-11 DIAGNOSIS — C44519 Basal cell carcinoma of skin of other part of trunk: Secondary | ICD-10-CM | POA: Diagnosis not present

## 2018-02-17 ENCOUNTER — Ambulatory Visit: Payer: Medicare Other | Admitting: Family Medicine

## 2018-02-20 DIAGNOSIS — Z79891 Long term (current) use of opiate analgesic: Secondary | ICD-10-CM | POA: Diagnosis not present

## 2018-02-20 DIAGNOSIS — M963 Postlaminectomy kyphosis: Secondary | ICD-10-CM | POA: Diagnosis not present

## 2018-02-20 DIAGNOSIS — G894 Chronic pain syndrome: Secondary | ICD-10-CM | POA: Diagnosis not present

## 2018-02-20 DIAGNOSIS — M4722 Other spondylosis with radiculopathy, cervical region: Secondary | ICD-10-CM | POA: Diagnosis not present

## 2018-03-05 ENCOUNTER — Ambulatory Visit: Payer: Medicare Other | Admitting: Cardiovascular Disease

## 2018-03-12 ENCOUNTER — Encounter: Payer: Medicare Other | Admitting: Podiatry

## 2018-03-24 ENCOUNTER — Other Ambulatory Visit: Payer: Self-pay

## 2018-03-24 ENCOUNTER — Ambulatory Visit: Payer: Medicare Other | Admitting: Podiatry

## 2018-03-24 DIAGNOSIS — E039 Hypothyroidism, unspecified: Secondary | ICD-10-CM

## 2018-03-24 MED ORDER — LEVOTHYROXINE SODIUM 75 MCG PO TABS: 75.0000 ug | ORAL_TABLET | Freq: Every day | ORAL | 0 refills | Status: DC

## 2018-03-26 ENCOUNTER — Ambulatory Visit (INDEPENDENT_AMBULATORY_CARE_PROVIDER_SITE_OTHER): Payer: Medicare Other

## 2018-03-26 ENCOUNTER — Other Ambulatory Visit: Payer: Self-pay

## 2018-03-26 ENCOUNTER — Encounter: Payer: Self-pay | Admitting: Podiatry

## 2018-03-26 ENCOUNTER — Ambulatory Visit (INDEPENDENT_AMBULATORY_CARE_PROVIDER_SITE_OTHER): Payer: Medicare Other | Admitting: Podiatry

## 2018-03-26 ENCOUNTER — Other Ambulatory Visit: Payer: Self-pay | Admitting: Podiatry

## 2018-03-26 VITALS — BP 148/87 | HR 76

## 2018-03-26 DIAGNOSIS — M79672 Pain in left foot: Secondary | ICD-10-CM | POA: Diagnosis not present

## 2018-03-26 DIAGNOSIS — I2510 Atherosclerotic heart disease of native coronary artery without angina pectoris: Secondary | ICD-10-CM | POA: Diagnosis not present

## 2018-03-26 DIAGNOSIS — M722 Plantar fascial fibromatosis: Secondary | ICD-10-CM

## 2018-03-26 MED ORDER — TRIAMCINOLONE ACETONIDE 10 MG/ML IJ SUSP
10.0000 mg | Freq: Once | INTRAMUSCULAR | Status: AC
Start: 2018-03-26 — End: 2018-03-26
  Administered 2018-03-26: 10 mg

## 2018-03-30 NOTE — Progress Notes (Signed)
Subjective:   Patient ID: Latoya [name], female   DOB: 72 y.o.   MRN: [id number]   HPI Patient presents stating she is had significant limb length discrepancy that she has accommodated with lifts outside of her foot with pain in the left heel which is occurred recently which is making it hard for her to be ambulatory   Review of Systems  All other systems reviewed and are negative.       Objective:  Physical Exam  Constitutional: She appears well-developed and well-nourished.  Cardiovascular: Intact distal pulses.  Pulmonary/Chest: Effort normal.  Musculoskeletal: Normal range of motion.  Neurological: She is alert.  Skin: Skin is warm.  Nursing note and vitals reviewed.   Neurovascular status found to be mildly diminished but intact with diminished muscle strength range of motion noted bilateral.  Patient has moderate elevation of the arch bilateral and is noted to have inflammation and pain of the left plantar heel at the insertional point tendon into the calcaneus with fluid buildup at the insertional point.  Patient is noted to have good digital perfusion is well oriented and does have significant limb length discrepancy with the left leg being shorter     Assessment:  Acute plantar fasciitis left with limb length discrepancy     Plan:  H&P condition reviewed and I injected the left plantar fascia 3 mg Kenalog 5 mg Xylocaine gave instructions on physical therapy.  Patient will be seen back to reevaluate on an as-needed basis and will continue to accommodate limb length discrepancy  X-ray indicates that the patient does have small spur formation with no indication of stress fracture arthritis

## 2018-03-31 ENCOUNTER — Encounter: Payer: Self-pay | Admitting: Cardiovascular Disease

## 2018-03-31 ENCOUNTER — Ambulatory Visit (INDEPENDENT_AMBULATORY_CARE_PROVIDER_SITE_OTHER): Payer: Medicare Other | Admitting: Cardiovascular Disease

## 2018-03-31 ENCOUNTER — Other Ambulatory Visit: Payer: Self-pay | Admitting: Cardiovascular Disease

## 2018-03-31 VITALS — BP 142/80 | HR 81 | Ht 62.0 in | Wt 205.6 lb

## 2018-03-31 DIAGNOSIS — I10 Essential (primary) hypertension: Secondary | ICD-10-CM | POA: Diagnosis not present

## 2018-03-31 DIAGNOSIS — I480 Paroxysmal atrial fibrillation: Secondary | ICD-10-CM | POA: Diagnosis not present

## 2018-03-31 DIAGNOSIS — I493 Ventricular premature depolarization: Secondary | ICD-10-CM

## 2018-03-31 DIAGNOSIS — I6523 Occlusion and stenosis of bilateral carotid arteries: Secondary | ICD-10-CM

## 2018-03-31 DIAGNOSIS — I2510 Atherosclerotic heart disease of native coronary artery without angina pectoris: Secondary | ICD-10-CM | POA: Diagnosis not present

## 2018-03-31 DIAGNOSIS — I5033 Acute on chronic diastolic (congestive) heart failure: Secondary | ICD-10-CM | POA: Diagnosis not present

## 2018-03-31 DIAGNOSIS — E785 Hyperlipidemia, unspecified: Secondary | ICD-10-CM

## 2018-03-31 LAB — BASIC METABOLIC PANEL
BUN/Creatinine Ratio: 30 — ABNORMAL HIGH (ref 12–28)
BUN: 21 mg/dL (ref 8–27)
CHLORIDE: 102 mmol/L (ref 96–106)
CO2: 23 mmol/L (ref 20–29)
Calcium: 10.9 mg/dL — ABNORMAL HIGH (ref 8.7–10.3)
Creatinine, Ser: 0.71 mg/dL (ref 0.57–1.00)
GFR, EST AFRICAN AMERICAN: 99 mL/min/{1.73_m2} (ref >59)
GFR, EST NON AFRICAN AMERICAN: 86 mL/min/{1.73_m2} (ref >59)
Glucose: 128 mg/dL — ABNORMAL HIGH (ref 65–99)
POTASSIUM: 4.8 mmol/L (ref 3.5–5.2)
SODIUM: 139 mmol/L (ref 134–144)

## 2018-03-31 MED ORDER — FUROSEMIDE 40 MG PO TABS: 40.0000 mg | ORAL_TABLET | Freq: Every day | ORAL | 3 refills | Status: DC

## 2018-03-31 NOTE — Progress Notes (Signed)
Chief Complaint  Patient presents with  . Follow-up    CAD    History of Present Illness: 72 yo female with history of PAF, HTN, hyperlipidemia, anti-phospholipid antibody syndrome, GERD, asthma, CAD here today for cardiac follow up. Cardiac cath in 2003 with moderate CAD (30% ostial LM stenosis, 50% mid LAD, 80% moderate sized Diagonal, 80% Circumflex). She has had no cath since then. She has chronic back pain. She has a hypercoagulable state and is on Xarelto. Most recent stress test October 2013 with no ischemia, normal LV function.  Last echo December 2018 with normal LV systolic function, grade 2 diastolic dysfunction and trivial mitral regurgitation. Carotid artery dopplers June 2018 with stable 40-59% bilateral stenosis. She was admitted to [facility] August 2017 with sepsis, ascending cholangitis complicated by respiratory failure, renal failure, CHF.  She underwent lap cholecystectomy 10/04/16. When seen in our office in December 2018, her BP was elevated and PVCs were present. Metoprolol was increased to 100 mg BID and chlorthalidone was added at the next visit.   She is here today for follow up. The patient denies any chest pain, dyspnea, palpitations, lower extremity edema, orthopnea, PND, dizziness, near syncope or syncope.   Primary Care Physician: Darreld Mclean, MD  Past Medical History:  Diagnosis Date  . Anemia, iron deficiency   . Antiphospholipid syndrome (HCC)    with hypercoagulable state  . Asthma    extrinsic; moderate, persistant, nml spirometry 2010, nml CXR 1/08  . Bladder troubles    REPORTS INFECTIONS ON OCCASION DUE TO URETHRA MEATUS STRICTURE AT BIRTH   . CAD (coronary artery disease)    a. 50% mid LAD, 80% ostial D1 and moderate 80% mid circ by cath 2003. b. nonischemic nuc in 2013.  Marland Kitchen Cancer ([place])    basal cell removed fr. L arm   . Carotid arterial disease ([place])    a. 03/2017 showed 1-39% bilateral internal carotid stenosis (high end range on  left), >50% LECA stenosis, with recommendation for f/u duplex 03/2018.  Marland Kitchen Complication of anesthesia    cardiac arrest- in OR, at age 39 (55)y.o. during Scalenotomy in her early 20's  . Drug allergy    heparin/lovenox  . DVT (deep venous thrombosis) ([place])   . Erosive gastritis 1994  . Family history of adverse reaction to anesthesia    some family members have had trouble waking up  . GERD (gastroesophageal reflux disease)   . H/O hiatal hernia   . HLD (hyperlipidemia)   . HTN (hypertension)    Pt states her high blood pressure readings are due tot he method of measurementsMcAlhaney at [employer], manages pt.  LOV 03/2014  . Hypothyroidism   . Liver spot    cyst- - no biopsy, but told that its benign   . Moderate mitral regurgitation   . PAF (paroxysmal atrial fibrillation) ([place])   . Pulmonary embolism ([place])    related to back surgery with prior coumadin use, now off  . PVC's (premature ventricular contractions)    a. noted in 09/2017.  Marland Kitchen Spondylosis, lumbosacral    ARTHRITIS- OA     Past Surgical History:  Procedure Laterality Date  . ABDOMINAL HYSTERECTOMY     partial abdominal -1998  . BACK SURGERY     x13 cervical and lumbar thoracic spine surgery  . BREAST ENHANCEMENT SURGERY    . BREAST SURGERY     all benign cysts x3   . CARDIAC CATHETERIZATION     2005  .  CHOLECYSTECTOMY N/A 10/04/2016   Procedure: LAPAROSCOPIC CHOLECYSTECTOMY;  Surgeon: Erroll Luna, MD;  Location: [facility] OR;  Service: General;  Laterality: N/A;  . cleft lip and palate repair     72 yo   . hysterectomy    . IR GENERIC HISTORICAL  06/04/2016   IR PERC CHOLECYSTOSTOMY 06/04/2016 Greggory Keen, MD [facility]INTER[facility]  . IR GENERIC HISTORICAL  08/06/2016   IR CHOLANGIOGRAM EXISTING TUBE 08/06/2016 Aletta Edouard, MD [facility]  . IR GENERIC HISTORICAL  07/17/2016   IR RADIOLOGIST EVAL & MGMT 07/17/2016 [facility] INTER[facility]  . JOINT REPLACEMENT    . POSTERIOR CERVICAL FUSION/FORAMINOTOMY Right 08/31/2014    Procedure: Right cervical six-seven foraminotomy, Cervical five-Thoracic one fusion, Cervical six-seven lateral mass screws;  Surgeon: Erline Levine, MD;  Location: [facility];  Service: Neurosurgery;  Laterality: Right;  . spina bifida repair     72 yo   . TONSILLECTOMY    . TOTAL KNEE ARTHROPLASTY     left    Current Outpatient Medications  Medication Sig Dispense Refill  . albuterol (PROVENTIL HFA;VENTOLIN HFA) 108 (90 Base) MCG/ACT inhaler Inhale 2 puffs into the lungs every 6 (six) hours as needed for wheezing or shortness of breath. 18 g 5  . aspirin 81 MG EC tablet Take 81 mg by mouth daily.     Marland Kitchen atorvastatin (LIPITOR) 40 MG tablet TAKE 1 TABLET ONCE DAILY. 90 tablet 3  . azelastine (ASTELIN) 137 MCG/SPRAY nasal spray Place 1 spray into the nose 2 (two) times daily.     . beclomethasone (QVAR REDIHALER) 80 MCG/ACT inhaler Inhale 2 puffs into the lungs 3 (three) times daily. 10.6 g 5  . Calcium Carb-Cholecalciferol (CALCIUM 500 +D) 500-400 MG-UNIT TABS Take 1 tablet by mouth 2 (two) times daily.    . cyclobenzaprine (FLEXERIL) 10 MG tablet Take 10 mg by mouth 3 (three) times daily as needed for muscle spasms.    Marland Kitchen docusate sodium (COLACE) 100 MG capsule Take 200 mg by mouth 2 (two) times daily.     Marland Kitchen doxycycline (VIBRA-TABS) 100 MG tablet Take 1 tablet (100 mg total) by mouth 2 (two) times daily. 14 tablet 0  . EPINEPHrine 0.3 mg/0.3 mL IJ SOAJ injection Inject 0.3 mg into the muscle once. As needed for anaphylaxis    . esomeprazole (NEXIUM) 40 MG capsule Take 40 mg by mouth at bedtime.     . gabapentin (NEURONTIN) 600 MG tablet Take 300-600 mg by mouth at bedtime.     . Glucosamine-Chondroitin 750-600 MG TABS Take 2 tablets by mouth 2 (two) times daily.     Marland Kitchen HYDROmorphone (DILAUDID) 4 MG tablet     . levothyroxine (SYNTHROID, LEVOTHROID) 75 MCG tablet Take 1 tablet (75 mcg total) by mouth daily before breakfast. 90 tablet 0  . Loratadine-Pseudoephedrine (CLARITIN-D 12 HOUR PO) Take  1 tablet by mouth 2 (two) times daily.     . Melatonin 5 MG TABS Take 2.5-5 mg by mouth at bedtime as needed.    . metaxalone (SKELAXIN) 800 MG tablet Take 800 mg by mouth every 8 (eight) hours as needed. For muscle pain    . montelukast (SINGULAIR) 10 MG tablet     . Multiple Vitamin (MULTIVITAMIN WITH MINERALS) TABS Take 1 tablet by mouth daily.    . nitroGLYCERIN (NITROSTAT) 0.4 MG SL tablet DISSOLVE 1 TABLET UNDER TONGUE AS NEEDED FOR CHEST PAIN,MAY REPEAT IN5 MINUTES FOR 2 DOSES. 25 tablet 6  . potassium chloride (K-DUR) 10 MEQ tablet Take  4 tablets (40 mEq total) by mouth 2 (two) times daily. 720 tablet 3  . Respiratory Therapy Supplies (FLUTTER) DEVI Use as directed 1 each 0  . triamcinolone (NASACORT) 55 MCG/ACT nasal inhaler Place 1-2 sprays into the nose at bedtime.     . triamcinolone lotion (KENALOG) 0.1 %     . XARELTO 20 MG TABS tablet TAKE 1 TABLET ONCE A DAY WITH SUPPER. 30 tablet 5  . furosemide (LASIX) 40 MG tablet Take 1 tablet (40 mg total) by mouth daily. 90 tablet 3  . metoprolol tartrate (LOPRESSOR) 100 MG tablet Take 1 tablet (100 mg total) by mouth 2 (two) times daily. 180 tablet 3   No current facility-administered medications for this visit.     Allergies  Allergen Reactions  . Chlorhexidine Anaphylaxis and Itching    Sensitive to dye in Betadine & Chlorohexadine   . Enoxaparin Sodium Anaphylaxis  . Heparin Anaphylaxis  . Hornet Venom Anaphylaxis    European Hornets  . Other Anaphylaxis and Itching    Sensitive to dye in Betadine & Chlorohexadine   . Pork-Derived Products Anaphylaxis  . Povidone-Iodine Itching and Rash    NOTE: Patient has had anaphylactic reaction to Betadine due to dyes in product. Sensitivity-" but if its wiped off she is able to tolerate betadine"   . Indomethacin Rash  . Lactose Intolerance (Gi) Diarrhea and Nausea And Vomiting    Can tolerate most [place] cheeses (Takes Lactaid)  . Phenylpropanolamine Hypertension    Social  History   Socioeconomic History  . Marital status: Married    Spouse name: Not on file  . Number of children: Not on file  . Years of education: Not on file  . Highest education level: Not on file  Occupational History  . Not on file  Social Needs  . Financial resource strain: Not on file  . Food insecurity:    Worry: Not on file    Inability: Not on file  . Transportation needs:    Medical: Not on file    Non-medical: Not on file  Tobacco Use  . Smoking status: Never Smoker  . Smokeless tobacco: Never Used  . Tobacco comment: no smoking   Substance and Sexual Activity  . Alcohol use: No  . Drug use: No  . Sexual activity: Not on file  Lifestyle  . Physical activity:    Days per week: Not on file    Minutes per session: Not on file  . Stress: Not on file  Relationships  . Social connections:    Talks on phone: Not on file    Gets together: Not on file    Attends religious service: Not on file    Active member of club or organization: Not on file    Attends meetings of clubs or organizations: Not on file    Relationship status: Not on file  . Intimate partner violence:    Fear of current or ex partner: Not on file    Emotionally abused: Not on file    Physically abused: Not on file    Forced sexual activity: Not on file  Other Topics Concern  . Not on file  Social History Narrative   Lives in [place] with husband; has had miscarriages but no children.    Disabled but exercises nearly every day (can only exericse in water - aerobics)   Takes opioids for pain relief; takes no herbal medications; has a heart healthy diet.     Family History  Problem Relation Age of Onset  . Heart disease Father        MI   . Heart disease Sister     Review of Systems:  As stated in the HPI and otherwise negative.   BP (!) 142/80   Pulse 81   Ht 5\' 2"  (1.575 m)   Wt 205 lb 9.6 oz (93.3 kg)   SpO2 97%   BMI 37.60 kg/m   Physical Examination:  General: Well developed, well  nourished, NAD  HEENT: OP clear, mucus membranes moist  SKIN: warm, dry. No rashes. Neuro: No focal deficits  Musculoskeletal: Muscle strength 5/5 all ext  Psychiatric: Mood and affect normal  Neck: No JVD, no carotid bruits, no thyromegaly, no lymphadenopathy.  Lungs:Clear bilaterally, no wheezes, rhonci, crackles Cardiovascular: Regular rate and rhythm. No murmurs, gallops or rubs. Abdomen:Soft. Bowel sounds present. Non-tender.  Extremities: No lower extremity edema. Pulses are 2 + in the bilateral DP/PT.  Echo December 2018:  - Left ventricle: The cavity size was normal. Wall thickness was   normal. Systolic function was normal. The estimated ejection   fraction was in the range of 50% to 55%. Wall motion was normal;   there were no regional wall motion abnormalities. Features are   consistent with a pseudonormal left ventricular filling pattern,   with concomitant abnormal relaxation and increased filling   pressure (grade 2 diastolic dysfunction).  Cardiac cath 09/22/02: 1. Ventriculography was performed in the RAO projection. Overall ejection  fraction was 70%. No segmental wall motion abnormalities were  identified.  2. The left main coronary artery has, what appears to be, about 30%  narrowing at the ostium. In most views there does appear to be a patent  ostium; however, some tapered narrowing cannot be excluded.  3. The LAD proper has about 40-50% narrowing, at most, in the mid portion  beyond the diagonal takeoff. The vessel is calcified. The diagonal  takeoff itself has a long 80% stenosis at the ostium extending out into  the mid portion of the diagonal. The distal diagonal does appear to be  suitable for grafting.  4. The large circumflex has about 70-80% focal narrowing prior to the  bifurcation of the marginal.  5. The right coronary artery has some mild luminal irregularities but no  high-grade stenoses.  IMPRESSION:  1. Coronary artery disease with significant  involvement and circumflex  involvement.  2. Multiple medical issues, as described above.  EKG:  EKG is not ordered today. The ekg ordered today demonstrates   Recent Labs: 09/23/2017: Hemoglobin 14.7; Magnesium 2.2; Platelets 249; TSH 1.400 11/05/2017: BUN 18; Creatinine, Ser 0.86; Potassium 4.1; Sodium 141   Lipid Panel Lipid Panel     Component Value Date/Time   CHOL 170 12/21/2015   TRIG 211 (H) 06/07/2016 0415   HDL 59 12/21/2015   CHOLHDL 4 08/05/2009 0935   VLDL 51.8 (H) 08/05/2009 0935   LDLDIRECT 98.6 08/05/2009 0935     Wt Readings from Last 3 Encounters:  03/31/18 205 lb 9.6 oz (93.3 kg)  11/22/17 193 lb 6 oz (87.7 kg)  10/29/17 193 lb (87.5 kg)     Other studies Reviewed: Additional studies/ records that were reviewed today include: . Review of the above records demonstrates:    Assessment and Plan:   1. CAD without angina: She is known to have moderate CAD by cath in 2003. She has no chest pain. LV function normal by echo in December 2018. Continue ASA, statin and beta  blocker.    2. Atrial fib, paroxysmal: Sinus today. Continue Xarelto and beta blocker.   3. HYPERTENSION: BP is well controlled. No changes today  4. CAROTID ARTERY DISEASE: Mild bilateral carotid disease by dopplers June 2018. Repeat now  5. Hyperlipidemia: Lipids followed in primary care per pt. Will continue statin  6. Acute on chronic diastolic CHF: Her weight is up 12 lbs over 4 months. She has LE edema. I will stop Chlorthalidone and start Lasix 40 mg daily. Check BMET today and in one week.   Current medicines are reviewed at length with the patient today.  The patient does not have concerns regarding medicines.  The following changes have been made:  no change  Labs/ tests ordered today include:   Orders Placed This Encounter  Procedures  . Basic metabolic panel  . Basic metabolic panel   Disposition:   FU with me in 6 months  Signed, Lauree Chandler, MD 03/31/2018  11:51 AM    [facility] [place], [place], [place]  [address] Phone: [phone]; Fax: [phone]

## 2018-03-31 NOTE — Patient Instructions (Signed)
Medication Instructions:  Your physician has recommended you make the following change in your medication:  1.) stop chlorthalidone 2.) start lasix (furosemide) 40 mg once a day   Labwork: Your physician recommends that you return for lab work in: today [place]) and in [place] (BMET)   Testing/Procedures: Your physician has requested that you have a carotid duplex. This test is an ultrasound of the carotid arteries in your neck. It looks at blood flow through these arteries that supply the brain with blood. Allow one hour for this exam. There are no restrictions or special instructions.    Follow-Up: Your physician wants you to follow-up in: [place], PA-C.  You will receive a reminder letter in the mail two months in advance. If you don't receive a letter, please call our office to schedule the follow-up appointment.   Any Other Special Instructions Will Be Listed Below (If Applicable).     If you need a refill on your cardiac medications before your next appointment, please call your pharmacy.

## 2018-04-01 ENCOUNTER — Telehealth: Payer: Self-pay | Admitting: Cardiovascular Disease

## 2018-04-01 NOTE — Telephone Encounter (Signed)
New Message: ° ° ° ° ° ° °Pt is returning a call for results. °

## 2018-04-01 NOTE — Telephone Encounter (Signed)
Notes recorded by Burnell Blanks, MD on 04/01/2018 at 11:12 AM EDT BMET ok. She can continue the Lasix.   Reviewed lab results and plan of care with patient who verbalized understanding and thanked me for the call.

## 2018-04-03 DIAGNOSIS — Z79891 Long term (current) use of opiate analgesic: Secondary | ICD-10-CM | POA: Diagnosis not present

## 2018-04-03 DIAGNOSIS — M4722 Other spondylosis with radiculopathy, cervical region: Secondary | ICD-10-CM | POA: Diagnosis not present

## 2018-04-03 DIAGNOSIS — M963 Postlaminectomy kyphosis: Secondary | ICD-10-CM | POA: Diagnosis not present

## 2018-04-03 DIAGNOSIS — G894 Chronic pain syndrome: Secondary | ICD-10-CM | POA: Diagnosis not present

## 2018-04-05 ENCOUNTER — Encounter: Payer: Self-pay | Admitting: Family Medicine

## 2018-04-07 ENCOUNTER — Other Ambulatory Visit: Payer: Medicare Other | Admitting: *Deleted

## 2018-04-07 ENCOUNTER — Ambulatory Visit (HOSPITAL_COMMUNITY)
Admission: RE | Admit: 2018-04-07 | Discharge: 2018-04-07 | Disposition: A | Discharge status: Home / Self Care | Payer: Medicare Other | Source: Ambulatory Visit | Attending: Cardiovascular Disease | Admitting: Cardiovascular Disease

## 2018-04-07 ENCOUNTER — Encounter: Payer: Self-pay | Admitting: Family Medicine

## 2018-04-07 DIAGNOSIS — I10 Essential (primary) hypertension: Secondary | ICD-10-CM

## 2018-04-07 DIAGNOSIS — I6523 Occlusion and stenosis of bilateral carotid arteries: Secondary | ICD-10-CM | POA: Diagnosis not present

## 2018-04-07 DIAGNOSIS — I5033 Acute on chronic diastolic (congestive) heart failure: Secondary | ICD-10-CM

## 2018-04-08 LAB — BASIC METABOLIC PANEL
BUN/Creatinine Ratio: 29 — ABNORMAL HIGH (ref 12–28)
BUN: 24 mg/dL (ref 8–27)
CALCIUM: 10.2 mg/dL (ref 8.7–10.3)
CO2: 26 mmol/L (ref 20–29)
CREATININE: 0.82 mg/dL (ref 0.57–1.00)
Chloride: 100 mmol/L (ref 96–106)
GFR calc Af Amer: 83 mL/min/{1.73_m2} (ref >59)
GFR, EST NON AFRICAN AMERICAN: 72 mL/min/{1.73_m2} (ref >59)
Glucose: 153 mg/dL — ABNORMAL HIGH (ref 65–99)
Potassium: 4.5 mmol/L (ref 3.5–5.2)
Sodium: 143 mmol/L (ref 134–144)

## 2018-04-11 NOTE — Telephone Encounter (Signed)
Disregard opened in error °

## 2018-04-15 DIAGNOSIS — Z85828 Personal history of other malignant neoplasm of skin: Secondary | ICD-10-CM | POA: Diagnosis not present

## 2018-04-15 DIAGNOSIS — L905 Scar conditions and fibrosis of skin: Secondary | ICD-10-CM | POA: Diagnosis not present

## 2018-04-16 ENCOUNTER — Ambulatory Visit (INDEPENDENT_AMBULATORY_CARE_PROVIDER_SITE_OTHER): Payer: Medicare Other | Admitting: Family Medicine

## 2018-04-16 ENCOUNTER — Encounter: Payer: Self-pay | Admitting: Family Medicine

## 2018-04-16 VITALS — BP 122/86 | HR 77 | Resp 18 | Ht 62.0 in | Wt 203.0 lb

## 2018-04-16 DIAGNOSIS — E039 Hypothyroidism, unspecified: Secondary | ICD-10-CM | POA: Diagnosis not present

## 2018-04-16 DIAGNOSIS — D6859 Other primary thrombophilia: Secondary | ICD-10-CM | POA: Diagnosis not present

## 2018-04-16 DIAGNOSIS — R7303 Prediabetes: Secondary | ICD-10-CM

## 2018-04-16 DIAGNOSIS — R600 Localized edema: Secondary | ICD-10-CM | POA: Diagnosis not present

## 2018-04-16 DIAGNOSIS — M2170 Unequal limb length (acquired), unspecified site: Secondary | ICD-10-CM

## 2018-04-16 DIAGNOSIS — I6523 Occlusion and stenosis of bilateral carotid arteries: Secondary | ICD-10-CM | POA: Diagnosis not present

## 2018-04-16 DIAGNOSIS — I10 Essential (primary) hypertension: Secondary | ICD-10-CM

## 2018-04-16 DIAGNOSIS — E785 Hyperlipidemia, unspecified: Secondary | ICD-10-CM

## 2018-04-16 NOTE — Patient Instructions (Addendum)
It was good to see you today!  Enjoy your new car!   I might try knee high compression socks for your residual leg swelling I will be in touch with your labs asap

## 2018-04-16 NOTE — Progress Notes (Addendum)
[place] at [facility] [address] [place], [place] [address] [phone]  Date:  04/16/2018   Name:  Latoya [name]   DOB:  09-28-1946   MRN:  [id number]  PCP:  Darreld Mclean, MD    Chief Complaint: Hyperlipidemia (6 month follow up); Coronary Artery Disease; Hypothyroidism; and Atrial Fibrillation   History of Present Illness:  Latoya [name] is a 72 y.o. very pleasant female patient who presents with the following:  Periodic follow-up visit today History of a fib, hyperlipidemia, CAD, hypothyroidism, antiphospholipid syndrome on xarelto  Per cardiology visit about 2 weeks ago:  1. CAD without angina: She is known to have moderate CAD by cath in 2003. She has no chest pain. LV function normal by echo in December 2018. Continue ASA, statin and beta blocker.   2. Atrial fib, paroxysmal: Sinus today. Continue Xarelto and beta blocker.  3. HYPERTENSION: BP is well controlled. No changes today 4. CAROTID ARTERY DISEASE: Mild bilateral carotid disease by dopplers June 2018. Repeat now 5. Hyperlipidemia: Lipids followed in primary care per pt. Will continue statin 6. Acute on chronic diastolic CHF: Her weight is up 12 lbs over 4 months. She has LE edema. I will stop Chlorthalidone and start Lasix 40 mg daily. Check BMET today and in one week  I last saw her in November, as follows.  Last year she was quite ill with ascending cholangitis,  She was admitted to [facility] August 2017 with sepsis, ascending cholangitis complicated by respiratory failure, renal failure, CHF. She underwent lap cholecystectomy 10/04/16. She is getting her strengthback and is back in her garden working She had her carotid dopplersjust recentlywhich were stable  She is decreasing her fentanyl dose; they are down to 37.5 mcg, was at 62 last visit. This is managed by Dr. Myles Rosenthal.  He also prescribes her po Dilaudid which she uses PRN  Lab  Results  Component Value Date   TSH 1.400 09/23/2017   Lab Results  Component Value Date   HGBA1C 5.5 04/29/2017  she is getting a new car as she no longer needs to use her electric scooter.  She is just using a rollator now  She is seeing a podiatrist for her foot issues- Dr. Paulla Dolly.  He has done an injection for a tendon issue She also learned that she has a leg length discrepancy which may contribute to her back pain She was told to get a shoe lift to help resolve her leg length inequality - requests a letter to biomed for this device Her RIGHT leg is shorter, she would like to have her shoe built up on the bottom by [employer] Readings from Last 3 Encounters:  04/16/18 203 lb (92.1 kg)  03/31/18 205 lb 9.6 oz (93.3 kg)  11/22/17 193 lb 6 oz (87.7 kg)   She notes that since she changed from chlorthalidone to lasix her shoes go on more easily.  Her weight is down by just a couple of pounds however. She is taking her potassium also as rx   Patient Active Problem List   Diagnosis Date Noted  . Spondylosis, lumbosacral   . PVC's (premature ventricular contractions)   . Pulmonary embolism ([place])   . PAF (paroxysmal atrial fibrillation) ([place])   . Moderate mitral regurgitation   . Liver spot   . HLD (hyperlipidemia)   . HTN (hypertension)   . H/O hiatal hernia   . GERD (  gastroesophageal reflux disease)   . Family history of adverse reaction to anesthesia   . DVT (deep venous thrombosis) ([place])   . Drug allergy   . Complication of anesthesia   . Carotid arterial disease ([place])   . Cancer ([place])   . CAD (coronary artery disease)   . Bladder troubles   . Antiphospholipid syndrome ([place])   . Anemia, iron deficiency   . Pre-diabetes 09/25/2016  . Leukocytosis 08/04/2016  . Nausea with vomiting 08/04/2016  . Hypothyroidism 08/04/2016  . Anemia of chronic disease 08/04/2016  . Cholangitis   . Gastroparesis   . Enterococcal bacteremia 06/06/2016  . Bacteremia due to Klebsiella  pneumoniae 06/06/2016  . Bacteremia due to Escherichia coli 06/06/2016  . Ascending cholangitis 06/04/2016  . Hemangioma of liver 06/04/2016  . Cervical pseudoarthrosis ([place]) 08/31/2014  . Anaphylaxis, mild, due to wasp envenomation 04/12/2013  . Chest pain, atypical 04/11/2013  . PAD (peripheral artery disease) ([place]) 06/20/2011  . ANEMIA, IRON DEFICIENCY, MICROCYTIC 06/01/2010  . Primary hypercoagulable state ([place]) 06/01/2010  . BACK PAIN, CHRONIC 06/01/2010  . ABDOMINAL PAIN -GENERALIZED 06/01/2010  . CAD in native artery 11/15/2009  . MURMUR 11/15/2009  . CAROTID BRUIT, LEFT 11/15/2009  . Essential hypertension 03/18/2009  . History of pulmonary embolism 03/18/2009  . PAROXYSMAL ATRIAL FIBRILLATION 03/18/2009  . DVT 03/18/2009  . Asthma 03/18/2009  . GERD 03/18/2009  . SPONDYLOSIS, LUMBAR 03/18/2009  . Hyperlipidemia 11/29/2008  . Erosive gastritis 10/22/1992    Past Medical History:  Diagnosis Date  . Anemia, iron deficiency   . Antiphospholipid syndrome (H[facility])    with hypercoagulable state  . Asthma    extrinsic; moderate, persistant, nml spirometry 2010, nml CXR 1/08  . Bladder troubles    REPORTS INFECTIONS ON OCCASION DUE TO URETHRA MEATUS STRICTURE AT BIRTH   . CAD (coronary artery disease)    a. 50% mid LAD, 80% ostial D1 and moderate 80% mid circ by cath 2003. b. nonischemic nuc in 2013.  Marland Kitchen Cancer ([place])    basal cell removed fr. L arm   . Carotid arterial disease ([place])    a. 03/2017 showed 1-39% bilateral internal carotid stenosis (high end range on left), >50% LECA stenosis, with recommendation for f/u duplex 03/2018.  Marland Kitchen Complication of anesthesia    cardiac arrest- in OR, at age 32 (77)y.o. during Scalenotomy in her early 20's  . Drug allergy    heparin/lovenox  . DVT (deep venous thrombosis) ([place])   . Erosive gastritis 1994  . Family history of adverse reaction to anesthesia    some family members have had trouble waking up  . GERD (gastroesophageal  reflux disease)   . H/O hiatal hernia   . HLD (hyperlipidemia)   . HTN (hypertension)    Pt states her high blood pressure readings are due tot he method of measurementsMcAlhaney at [employer], manages pt.  LOV 03/2014  . Hypothyroidism   . Liver spot    cyst- - no biopsy, but told that its benign   . Moderate mitral regurgitation   . PAF (paroxysmal atrial fibrillation) ([place])   . Pulmonary embolism ([employer])    related to back surgery with prior coumadin use, now off  . PVC's (premature ventricular contractions)    a. noted in 09/2017.  Marland Kitchen Spondylosis, lumbosacral    ARTHRITIS- OA     Past Surgical History:  Procedure Laterality Date  . ABDOMINAL HYSTERECTOMY     partial abdominal -1998  . BACK SURGERY  x13 cervical and lumbar thoracic spine surgery  . BREAST ENHANCEMENT SURGERY    . BREAST SURGERY     all benign cysts x3   . CARDIAC CATHETERIZATION     2005  . CHOLECYSTECTOMY N/A 10/04/2016   Procedure: LAPAROSCOPIC CHOLECYSTECTOMY;  Surgeon: Erroll Luna, MD;  Location: [facility] OR;  Service: General;  Laterality: N/A;  . cleft lip and palate repair     72 yo   . hysterectomy    . IR GENERIC HISTORICAL  06/04/2016   IR PERC CHOLECYSTOSTOMY 06/04/2016 Greggory Keen, MD [facility]INTER[facility] RAD  . IR GENERIC HISTORICAL  08/06/2016   IR CHOLANGIOGRAM EXISTING TUBE 08/06/2016 Aletta Edouard, MD [facility]INTER[facility]  . IR GENERIC HISTORICAL  07/17/2016   IR RADIOLOGIST EVAL & MGMT 07/17/2016 [facility] INTERV RAD  . JOINT REPLACEMENT    . POSTERIOR CERVICAL FUSION/FORAMINOTOMY Right 08/31/2014   Procedure: Right cervical six-seven foraminotomy, Cervical five-Thoracic one fusion, Cervical six-seven lateral mass screws;  Surgeon: Erline Levine, MD;  Location: [place] [facility];  Service: Neurosurgery;  Laterality: Right;  . spina bifida repair     72 yo   . TONSILLECTOMY    . TOTAL KNEE ARTHROPLASTY     left    Social History   Tobacco Use  . Smoking status: Never Smoker  . Smokeless tobacco: Never Used  .  Tobacco comment: no smoking   Substance Use Topics  . Alcohol use: No  . Drug use: No    Family History  Problem Relation Age of Onset  . Heart disease Father        MI   . Heart disease Sister     Allergies  Allergen Reactions  . Chlorhexidine Anaphylaxis and Itching    Sensitive to dye in Betadine & Chlorohexadine   . Enoxaparin Sodium Anaphylaxis  . Heparin Anaphylaxis  . Hornet Venom Anaphylaxis    European Hornets  . Pork-Derived Products Anaphylaxis  . Povidone-Iodine Itching and Rash    NOTE: Patient has had anaphylactic reaction to Betadine due to dyes in product. Sensitivity-" but if its wiped off she is able to tolerate betadine"   . Indomethacin Rash  . Lactose Intolerance (Gi) Diarrhea and Nausea And Vomiting    Can tolerate most [place] cheeses (Takes Lactaid)  . Phenylpropanolamine Hypertension    Medication list has been reviewed and updated.  Current Outpatient Medications on File Prior to Visit  Medication Sig Dispense Refill  . albuterol (PROVENTIL HFA;VENTOLIN HFA) 108 (90 Base) MCG/ACT inhaler Inhale 2 puffs into the lungs every 6 (six) hours as needed for wheezing or shortness of breath. 18 g 5  . aspirin 81 MG EC tablet Take 81 mg by mouth daily.     Marland Kitchen atorvastatin (LIPITOR) 40 MG tablet TAKE 1 TABLET ONCE DAILY. 90 tablet 3  . azelastine (ASTELIN) 137 MCG/SPRAY nasal spray Place 1 spray into the nose 2 (two) times daily.     . beclomethasone (QVAR REDIHALER) 80 MCG/ACT inhaler Inhale 2 puffs into the lungs 3 (three) times daily. 10.6 g 5  . Calcium Carb-Cholecalciferol (CALCIUM 500 +D) 500-400 MG-UNIT TABS Take 1 tablet by mouth 2 (two) times daily.    . cyclobenzaprine (FLEXERIL) 10 MG tablet Take 10 mg by mouth 3 (three) times daily as needed for muscle spasms.    Marland Kitchen docusate sodium (COLACE) 100 MG capsule Take 200 mg by mouth 2 (two) times daily.     Marland Kitchen EPINEPHrine 0.3 mg/0.3 mL IJ SOAJ injection Inject 0.3 mg into the muscle  once. As needed for  anaphylaxis    . furosemide (LASIX) 40 MG tablet Take 1 tablet (40 mg total) by mouth daily. 90 tablet 3  . gabapentin (NEURONTIN) 600 MG tablet Take 300-600 mg by mouth at bedtime.     . Glucosamine-Chondroitin 750-600 MG TABS Take 2 tablets by mouth 2 (two) times daily.     Marland Kitchen HYDROmorphone (DILAUDID) 4 MG tablet     . levothyroxine (SYNTHROID, LEVOTHROID) 75 MCG tablet Take 1 tablet (75 mcg total) by mouth daily before breakfast. 90 tablet 0  . Loratadine-Pseudoephedrine (CLARITIN-D 12 HOUR PO) Take 1 tablet by mouth 2 (two) times daily.     . Melatonin 5 MG TABS Take 2.5-5 mg by mouth at bedtime as needed.    . montelukast (SINGULAIR) 10 MG tablet     . Multiple Vitamin (MULTIVITAMIN WITH MINERALS) TABS Take 1 tablet by mouth daily.    . nitroGLYCERIN (NITROSTAT) 0.4 MG SL tablet DISSOLVE 1 TABLET UNDER TONGUE AS NEEDED FOR CHEST PAIN,MAY REPEAT IN5 MINUTES FOR 2 DOSES. 25 tablet 6  . potassium chloride (K-DUR) 10 MEQ tablet Take 4 tablets (40 mEq total) by mouth 2 (two) times daily. 720 tablet 3  . Respiratory Therapy Supplies (FLUTTER) DEVI Use as directed 1 each 0  . triamcinolone (NASACORT) 55 MCG/ACT nasal inhaler Place 1-2 sprays into the nose at bedtime.     . triamcinolone lotion (KENALOG) 0.1 %     . XARELTO 20 MG TABS tablet TAKE 1 TABLET ONCE A DAY WITH SUPPER. 30 tablet 5  . metoprolol tartrate (LOPRESSOR) 100 MG tablet Take 1 tablet (100 mg total) by mouth 2 (two) times daily. 180 tablet 3   No current facility-administered medications on file prior to visit.     Review of Systems:  As per HPI- otherwise negative. No fever or chills No CP or SOB No rashes except for chronic stasis derm of her bilateral ankles.  She is overall feeling well today    Physical Examination: Vitals:   04/16/18 1409  BP: 122/86  Pulse: 77  Resp: 18  SpO2: 97%   Vitals:   04/16/18 1409  Weight: 203 lb (92.1 kg)  Height: 5\' 2"  (1.575 m)   Body mass index is 37.13 kg/m. Ideal Body  Weight: Weight in (lb) to have BMI = 25: 136.4  GEN: WDWN, NAD, Non-toxic, A & O x 3, obese, looks well  HEENT: Atraumatic, Normocephalic. Neck supple. No masses, No LAD. Bilateral TM wnl, oropharynx normal.  PEERL,EOMI.   Ears and Nose: No external deformity. CV: RRR- NOT in fib right now, No M/G/R. No JVD. No thrill. No extra heart sounds. PULM: CTA B, no wheezes, crackles, rhonchi. No retractions. No resp. distress. No accessory muscle use. ABD: S, NT, ND, +BS. No rebound. No HSM. EXTR: No c/c.  Chronic stasis derm and mild LE edema is still present  NEURO Normal gait for pt- she walks bent over her rolling walker  PSYCH: Normally interactive. Conversant. Not depressed or anxious appearing.  Calm demeanor.    Assessment and Plan: Hypothyroidism, unspecified type - Plan: TSH  Primary hypercoagulable state ([place])  Pre-diabetes - Plan: Hemoglobin A1c  Hyperlipidemia, unspecified hyperlipidemia type - Plan: Lipid panel  Essential hypertension  Lower extremity edema  Leg length difference, acquired  Check on TSH today Continue her xarelto for hypercoagulable state and a fib- in SR today however Check A1c, lipids Her BP is under fine control Edema is a bit better with lasix vs  chlorthalidone although weight is not much changed. Pt might try compression socks to knee to help with her ankle swelling  Note for biomed to build up her right shoe for leg length difference   Signed Lamar Blinks, MD  Received her labs 6/27- message to pt  Results for orders placed or performed in visit on 04/16/18  Hemoglobin A1c  Result Value Ref Range   Hgb A1c MFr Bld 6.5 4.6 - 6.5 %  TSH  Result Value Ref Range   TSH 2.10 0.35 - 4.50 uIU/mL  Lipid panel  Result Value Ref Range   Cholesterol 179 0 - 200 mg/dL   Triglycerides 210.0 (H) 0.0 - 149.0 mg/dL   HDL 54.30 >39.00 mg/dL   VLDL 42.0 (H) 0.0 - 40.0 mg/dL   Total CHOL/HDL Ratio 3    NonHDL 124.79   LDL cholesterol, direct  Result  Value Ref Range   Direct LDL 97.0 mg/dL

## 2018-04-17 ENCOUNTER — Encounter: Payer: Self-pay | Admitting: Family Medicine

## 2018-04-17 LAB — LIPID PANEL
CHOL/HDL RATIO: 3
Cholesterol: 179 mg/dL (ref 0–200)
HDL: 54.3 mg/dL (ref >39.00)
NONHDL: 124.79
Triglycerides: 210 mg/dL — ABNORMAL HIGH (ref 0.0–149.0)
VLDL: 42 mg/dL — AB (ref 0.0–40.0)

## 2018-04-17 LAB — LDL CHOLESTEROL, DIRECT: Direct LDL: 97 mg/dL

## 2018-04-17 LAB — TSH: TSH: 2.1 u[IU]/mL (ref 0.35–4.50)

## 2018-04-17 LAB — HEMOGLOBIN A1C: HEMOGLOBIN A1C: 6.5 % (ref 4.6–6.5)

## 2018-04-30 ENCOUNTER — Encounter: Payer: Self-pay | Admitting: Family Medicine

## 2018-05-01 ENCOUNTER — Telehealth: Payer: Self-pay | Admitting: Cardiovascular Disease

## 2018-05-01 NOTE — Telephone Encounter (Signed)
[facility] to clarify request: Dentist requests cardiologist to advise how long patient needs to hold Xarelto prior to tooth implant

## 2018-05-01 NOTE — Telephone Encounter (Signed)
Pt with hypercoagulable state (antiphospholipid syndrome) and hx of recurrent VTE events. Also with afib and CHADS2VASc score of 7 (HTN, age, sex, DM, CAD, VTE). Recommend continuing Xarelto for single dental implant due to elevated stroke risk.

## 2018-05-01 NOTE — Telephone Encounter (Signed)
New Message    1. What dental office are you calling from? [facility] Dr. Purnell Shoemaker  2. What is your office phone number? [phone]   3. What is your fax number? [phone]  4. What type of procedure is the patient having performed? implant  5. What date is procedure scheduled or is the patient there now?  tbd  6. What is your question (ex. Antibiotics prior to procedure, holding medication-we need to know how long dentist wants pt to hold med)? *depends on doctor

## 2018-05-02 NOTE — Telephone Encounter (Signed)
   Primary Cardiologist: Lauree Chandler, MD  Chart reviewed as part of pre-operative protocol coverage.   Per our pharmacy protocol:  Pt with hypercoagulable state (antiphospholipid syndrome) and hx of recurrent VTE events. Also with afib and CHADS2VASc score of 7 (HTN, age, sex, DM, CAD, VTE). Recommend continuing Xarelto for single dental implant due to elevated stroke risk.  I will route this recommendation to the requesting party via Epic fax function and remove from pre-op pool.  Please call with questions.  Daune Perch, NP 05/02/2018, 8:26 AM

## 2018-05-15 DIAGNOSIS — M963 Postlaminectomy kyphosis: Secondary | ICD-10-CM | POA: Diagnosis not present

## 2018-05-15 DIAGNOSIS — M4722 Other spondylosis with radiculopathy, cervical region: Secondary | ICD-10-CM | POA: Diagnosis not present

## 2018-05-15 DIAGNOSIS — G894 Chronic pain syndrome: Secondary | ICD-10-CM | POA: Diagnosis not present

## 2018-05-15 DIAGNOSIS — Z79891 Long term (current) use of opiate analgesic: Secondary | ICD-10-CM | POA: Diagnosis not present

## 2018-05-19 NOTE — Telephone Encounter (Signed)
[place]

## 2018-05-19 NOTE — Telephone Encounter (Signed)
° °  [facility] Pre-operative Risk Assessment    Request for surgical clearance:  What type of surgery is being performed? Sinus Lift in additional to the previous clearance that is scheduled. Per Almyra Free stating pt will need this additional surgery performed during the implant.  1. When is this surgery scheduled? TBD  2. What type of clearance is required (medical clearance vs. Pharmacy clearance to hold med vs. Both)? Both  3. Are there any medications that need to be held prior to surgery and how long? Yes  4. Practice name and name of physician performing surgery? [facility] //Dr Lillie Columbia  5. What is your office phone number [phone]   7.   What is your office fax number [phone]  8.   Anesthesia type (None, local, MAC, general) ? Don't know   Pauline Good 05/19/2018, 2:42 PM  _________________________________________________________________   (provider comments below)

## 2018-05-20 NOTE — Telephone Encounter (Signed)
Pharm:  For single tooth implant they were not going to hold Xarelto but with sinus lift will that change holding the xarelto?  Please advise.

## 2018-05-21 NOTE — Telephone Encounter (Signed)
Patient with diagnosis of Afib with history of PE/DVT and hypercoagulable state with  on Xarelto for anticoagulation.    Procedure: Sinus Lift (bone graft) and dental implant Date of procedure: TBD  CHADS2-VASc score of  4 (HTN, AGE, CAD, female)  Est CrCl > 2ml/min  Per office protocol, patient can hold Xarelto for 24 hours prior to procedure would recommend resume ASAP after procedure given history of multiple VTE and hypercoagulable state.    Hosteen Kienast Rodriguez-Guzman PharmD, BCPS, [place] [address] 05/21/2018 8:43 AM

## 2018-05-21 NOTE — Telephone Encounter (Signed)
   Primary Cardiologist: Lauree Chandler, MD  Chart reviewed as part of pre-operative protocol coverage. Given past medical history and time since last visit, based on ACC/AHA guidelines, Latoya [name] would be at acceptable risk for the planned procedure without further cardiovascular testing. I spoke with pt via phone who denies any cardiac symptoms. No CP or dyspnea. Last seen by Dr. Angelena Form 03/31/18.   Pharmacy has address Xarelto recommendations. Per office protocol, patient can holdXareltofor 24 hoursprior to procedure would recommend resume ASAP after procedure given history of multiple VTE and hypercoagulable state.   I will route this recommendation to the requesting party via Epic fax function and remove from pre-op pool.  Please call with questions.  [name], PA-C 05/21/2018, 3:38 PM

## 2018-06-13 NOTE — Progress Notes (Signed)
Subjective:   Latoya [name] is a 72 y.o. female who presents for Medicare Annual (Subsequent) preventive examination.  Review of Systems: No ROS.  Medicare Wellness Visit. Additional risk factors are reflected in the social history. Cardiac Risk Factors include: advanced age (>63men, >49 women);dyslipidemia;hypertension;obesity (BMI >30kg/m2);sedentary lifestyle Sleep patterns: No issues. Home Safety/Smoke Alarms: Feels safe in home. Smoke alarms in place. Lives with husband in 1 story home. Grab bars in shower.  Uses walker or cane in the house. Shower bench   Female:      Mammo-  Pt states she just has to call and schedule     Dexa scan-   Pt states she just has to call and schedule          CCS- last 2012 with 5 yr recall     Objective:     Vitals: BP 140/88 (BP Location: Left Arm, Patient Position: Sitting, Cuff Size: Normal)   Pulse 83   Ht 5\' 2"  (1.575 m)   Wt 200 lb 12.8 oz (91.1 kg)   SpO2 95%   BMI 36.73 kg/m   Body mass index is 36.73 kg/m.  Advanced Directives 06/16/2018 01/28/2017 09/26/2016 08/03/2016 06/03/2016 01/19/2015 08/09/2014  Does Patient Have a Medical Advance Directive? Yes Yes No No Yes Yes Yes  Type of Advance Directive Living will;Healthcare Power of [place];Living will - - Living will;Healthcare Power of Attorney Living will;Healthcare Power of Attorney Living will;Healthcare Power of Attorney  Does patient want to make changes to medical advance directive? No - Patient declined Yes (MAU/Ambulatory/Procedural Areas - Information given) - - No - Patient declined - -  Copy of [place] in Chart? No - copy requested No - copy requested - - No - copy requested - -  Would patient like information on creating a medical advance directive? - - No - Patient declined - - - -  Pre-existing out of facility DNR order (yellow form or pink MOST form) - - - - - - -    Tobacco Social History   Tobacco Use  Smoking  Status Never Smoker  Smokeless Tobacco Never Used  Tobacco Comment   no smoking      Counseling given: Not Answered Comment: no smoking    Clinical Intake: Pain : No/denies pain   Past Medical History:  Diagnosis Date  . Anemia, iron deficiency   . Antiphospholipid syndrome (HCC)    with hypercoagulable state  . Asthma    extrinsic; moderate, persistant, nml spirometry 2010, nml CXR 1/08  . Bladder troubles    REPORTS INFECTIONS ON OCCASION DUE TO URETHRA MEATUS STRICTURE AT BIRTH   . CAD (coronary artery disease)    a. 50% mid LAD, 80% ostial D1 and moderate 80% mid circ by cath 2003. b. nonischemic nuc in 2013.  Marland Kitchen Cancer ([place])    basal cell removed fr. L arm   . Carotid arterial disease ([place])    a. 03/2017 showed 1-39% bilateral internal carotid stenosis (high end range on left), >50% LECA stenosis, with recommendation for f/u duplex 03/2018.  Marland Kitchen Complication of anesthesia    cardiac arrest- in OR, at age 24 (76)y.o. during Scalenotomy in her early 20's  . Drug allergy    heparin/lovenox  . DVT (deep venous thrombosis) ([place])   . Erosive gastritis 1994  . Family history of adverse reaction to anesthesia    some family members have had trouble waking up  . GERD (gastroesophageal reflux  disease)   . H/O hiatal hernia   . HLD (hyperlipidemia)   . HTN (hypertension)    Pt states her high blood pressure readings are due tot he method of measurements[facility]haney at [facility], manages pt.  LOV 03/2014  . Hypothyroidism   . Liver spot    cyst- - no biopsy, but told that its benign   . Moderate mitral regurgitation   . PAF (paroxysmal atrial fibrillation) ([place])   . Pulmonary embolism ([place])    related to back surgery with prior coumadin use, now off  . PVC's (premature ventricular contractions)    a. noted in 09/2017.  Marland Kitchen Spondylosis, lumbosacral    ARTHRITIS- OA    Past Surgical History:  Procedure Laterality Date  . ABDOMINAL HYSTERECTOMY     partial abdominal -1998  . BACK  SURGERY     x13 cervical and lumbar thoracic spine surgery  . BREAST ENHANCEMENT SURGERY    . BREAST SURGERY     all benign cysts x3   . CARDIAC CATHETERIZATION     2005  . CHOLECYSTECTOMY N/A 10/04/2016   Procedure: LAPAROSCOPIC CHOLECYSTECTOMY;  Surgeon: Erroll Luna, MD;  Location: [facility] OR;  Service: General;  Laterality: N/A;  . cleft lip and palate repair     72 yo   . hysterectomy    . IR GENERIC HISTORICAL  06/04/2016   IR PERC CHOLECYSTOSTOMY 06/04/2016 Greggory Keen, MD [facility]  . IR GENERIC HISTORICAL  08/06/2016   IR CHOLANGIOGRAM EXISTING TUBE 08/06/2016 Aletta Edouard, MD [facility]  . IR GENERIC HISTORICAL  07/17/2016   IR RADIOLOGIST EVAL & MGMT 07/17/2016 [facility]  . JOINT REPLACEMENT    . POSTERIOR CERVICAL FUSION/FORAMINOTOMY Right 08/31/2014   Procedure: Right cervical six-seven foraminotomy, Cervical five-Thoracic one fusion, Cervical six-seven lateral mass screws;  Surgeon: Erline Levine, MD;  Location: [facility];  Service: Neurosurgery;  Laterality: Right;  . spina bifida repair     72 yo   . TONSILLECTOMY    . TOTAL KNEE ARTHROPLASTY     left   Family History  Problem Relation Age of Onset  . Heart disease Father        MI   . Heart disease Sister    Social History   Socioeconomic History  . Marital status: Married    Spouse name: Not on file  . Number of children: Not on file  . Years of education: Not on file  . Highest education level: Not on file  Occupational History  . Not on file  Social Needs  . Financial resource strain: Not on file  . Food insecurity:    Worry: Not on file    Inability: Not on file  . Transportation needs:    Medical: Not on file    Non-medical: Not on file  Tobacco Use  . Smoking status: Never Smoker  . Smokeless tobacco: Never Used  . Tobacco comment: no smoking   Substance and Sexual Activity  . Alcohol use: No  . Drug use: No  . Sexual activity: Not Currently  Lifestyle  . Physical  activity:    Days per week: Not on file    Minutes per session: Not on file  . Stress: Not on file  Relationships  . Social connections:    Talks on phone: Not on file    Gets together: Not on file    Attends religious service: Not on file    Active member of club or organization: Not on  file    Attends meetings of clubs or organizations: Not on file    Relationship status: Not on file  Other Topics Concern  . Not on file  Social History Narrative   Lives in [place] with husband; has had miscarriages but no children.    Disabled but exercises nearly every day (can only exericse in water - aerobics)   Takes opioids for pain relief; takes no herbal medications; has a heart healthy diet.     Outpatient Encounter Medications as of 06/16/2018  Medication Sig  . albuterol (PROVENTIL HFA;VENTOLIN HFA) 108 (90 Base) MCG/ACT inhaler Inhale 2 puffs into the lungs every 6 (six) hours as needed for wheezing or shortness of breath.  Marland Kitchen aspirin 81 MG EC tablet Take 81 mg by mouth daily.   Marland Kitchen atorvastatin (LIPITOR) 40 MG tablet TAKE 1 TABLET ONCE DAILY.  Marland Kitchen azelastine (ASTELIN) 137 MCG/SPRAY nasal spray Place 1 spray into the nose 2 (two) times daily.   . beclomethasone (QVAR REDIHALER) 80 MCG/ACT inhaler Inhale 2 puffs into the lungs 3 (three) times daily.  . Calcium Carb-Cholecalciferol (CALCIUM 500 +D) 500-400 MG-UNIT TABS Take 1 tablet by mouth 2 (two) times daily.  . cyclobenzaprine (FLEXERIL) 10 MG tablet Take 10 mg by mouth 3 (three) times daily as needed for muscle spasms.  Marland Kitchen docusate sodium (COLACE) 100 MG capsule Take 200 mg by mouth 2 (two) times daily.   . furosemide (LASIX) 40 MG tablet Take 1 tablet (40 mg total) by mouth daily.  Marland Kitchen gabapentin (NEURONTIN) 600 MG tablet Take 300-600 mg by mouth at bedtime.   . Glucosamine-Chondroitin 750-600 MG TABS Take 2 tablets by mouth 2 (two) times daily.   Marland Kitchen levothyroxine (SYNTHROID, LEVOTHROID) 75 MCG tablet Take 1 tablet (75 mcg total) by mouth daily  before breakfast.  . Loratadine-Pseudoephedrine (CLARITIN-D 12 HOUR PO) Take 1 tablet by mouth 2 (two) times daily.   . Melatonin 5 MG TABS Take 2.5-5 mg by mouth at bedtime as needed.  . montelukast (SINGULAIR) 10 MG tablet   . Multiple Vitamin (MULTIVITAMIN WITH MINERALS) TABS Take 1 tablet by mouth daily.  . potassium chloride (K-DUR) 10 MEQ tablet Take 4 tablets (40 mEq total) by mouth 2 (two) times daily.  Marland Kitchen Respiratory Therapy Supplies (FLUTTER) DEVI Use as directed  . triamcinolone (NASACORT) 55 MCG/ACT nasal inhaler Place 1-2 sprays into the nose at bedtime.   . triamcinolone lotion (KENALOG) 0.1 %   . XARELTO 20 MG TABS tablet TAKE 1 TABLET ONCE A DAY WITH SUPPER.  Marland Kitchen EPINEPHrine 0.3 mg/0.3 mL IJ SOAJ injection Inject 0.3 mg into the muscle once. As needed for anaphylaxis  . HYDROmorphone (DILAUDID) 4 MG tablet   . metoprolol tartrate (LOPRESSOR) 100 MG tablet Take 1 tablet (100 mg total) by mouth 2 (two) times daily.  . nitroGLYCERIN (NITROSTAT) 0.4 MG SL tablet DISSOLVE 1 TABLET UNDER TONGUE AS NEEDED FOR CHEST PAIN,MAY REPEAT IN5 MINUTES FOR 2 DOSES. (Patient not taking: Reported on 06/16/2018)   No facility-administered encounter medications on file as of 06/16/2018.     Activities of Daily Living In your present state of health, do you have any difficulty performing the following activities: 06/16/2018  Hearing? N  Vision? N  Difficulty concentrating or making decisions? N  Walking or climbing stairs? Y  Dressing or bathing? N  Doing errands, shopping? N  Preparing Food and eating ? N  Using the Toilet? N  In the past six months, have you accidently leaked urine? Darreld Mclean  Do you have problems with loss of bowel control? N  Managing your Medications? N  Managing your Finances? N  Housekeeping or managing your Housekeeping? N  Some recent data might be hidden    Patient Care Team: Copland, Gay Filler, MD as PCP - General (Family Medicine) Burnell Blanks, MD as PCP -  Cardiology (Cardiology) Nicholaus Bloom, MD as Consulting Physician (Anesthesiology) Erline Levine, MD as Consulting Physician (Neurosurgery) Mosetta Anis, MD as Consulting Physician (Allergy) Tanda Rockers, MD as Consulting Physician (Pulmonary Disease) Burnell Blanks, MD as Consulting Physician (Cardiology) Macarthur Critchley, [place] as Consulting Physician (Optometry) Druscilla Brownie, MD as Consulting Physician (Dermatology) Dionne Milo, Rockney Ghee, MD as Consulting Physician (Hematology and Oncology)    Assessment:   This is a routine wellness examination for Latoya [name]. Physical assessment deferred to PCP.  Exercise Activities and Dietary recommendations Current Exercise Habits: The patient does not participate in regular exercise at present, Exercise limited by: neurologic condition(s);orthopedic condition(s)   Diet (meal preparation, eat out, water intake, caffeinated beverages, dairy products, fruits and vegetables): well balanced Breakfast: oatmeal and toast, and juice and hot tea and 2 prunes Lunch: yogurts Dinner: Vegetable plate    Goals    . Increase physical activity     Return to water aerobics tolerated.    . Weight (lb) < 200 lb (90.7 kg)     Goal weight 175lb       Fall Risk Fall Risk  06/16/2018 01/28/2017  Falls in the past year? No No   Depression Screen PHQ 2/9 Scores 06/16/2018 04/16/2018 01/28/2017  PHQ - 2 Score 0 0 0     Cognitive Function MMSE - Mini Mental State Exam 06/16/2018 01/28/2017  Orientation to time 5 5  Orientation to Place 5 5  Registration 3 3  Attention/ Calculation 5 5  Recall 3 3  Language- name 2 objects 2 2  Language- repeat 1 1  Language- follow 3 step command 3 3  Language- read & follow direction 1 1  Write a sentence 1 1  Copy design 1 1  Total score 30 30        Immunization History  Administered Date(s) Administered  . Influenza Split 07/22/2016  . Influenza-Unspecified 07/22/2013, 06/22/2014, 08/22/2017  . Pneumococcal  Conjugate-13 08/09/2014  . Pneumococcal-Unspecified 07/22/2013  . Tdap 01/28/2017   Screening Tests Health Maintenance  Topic Date Due  . INFLUENZA VACCINE  07/14/2018 (Originally 05/22/2018)  . MAMMOGRAM  01/31/2019  . COLONOSCOPY  01/20/2021  . TETANUS/TDAP  01/29/2027  . DEXA SCAN  Completed  . Hepatitis C Screening  Completed  . PNA vac Low Risk Adult  Completed      Plan:    Please schedule your next medicare wellness visit with me in 1 yr.  Continue to eat heart healthy diet (full of fruits, vegetables, whole grains, lean protein, water--limit salt, fat, and sugar intake) and increase physical activity as tolerated.  Bring a copy of your living will and/or healthcare power of attorney to your next office visit.  Continue doing brain stimulating activities (puzzles, reading, adult coloring books, staying active) to keep memory sharp.   Please schedule mammogram and bone density as discussed  I have personally reviewed and noted the following in the patient's chart:   . Medical and social history . Use of alcohol, tobacco or illicit drugs  . Current medications and supplements . Functional ability and status . Nutritional status . Physical activity . Advanced directives . List of other physicians .  Hospitalizations, surgeries, and ER visits in previous 12 months . Vitals . Screenings to include cognitive, depression, and falls . Referrals and appointments  In addition, I have reviewed and discussed with patient certain preventive protocols, quality metrics, and best practice recommendations. A written personalized care plan for preventive services as well as general preventive health recommendations were provided to patient.     Shela Nevin, [place]  06/16/2018

## 2018-06-16 ENCOUNTER — Encounter: Payer: Self-pay | Admitting: *Deleted

## 2018-06-16 ENCOUNTER — Ambulatory Visit (INDEPENDENT_AMBULATORY_CARE_PROVIDER_SITE_OTHER): Payer: Medicare Other | Admitting: *Deleted

## 2018-06-16 VITALS — BP 140/88 | HR 83 | Ht 62.0 in | Wt 200.8 lb

## 2018-06-16 DIAGNOSIS — Z0000 Encounter for general adult medical examination without abnormal findings: Secondary | ICD-10-CM | POA: Diagnosis not present

## 2018-06-16 NOTE — Progress Notes (Signed)
I have reviewed the above MWE by Ms. Britt and agree with her documentation J Abrahan Fulmore MD  

## 2018-06-16 NOTE — Patient Instructions (Addendum)
Please schedule your next medicare wellness visit with me in 1 yr.  Continue to eat heart healthy diet (full of fruits, vegetables, whole grains, lean protein, water--limit salt, fat, and sugar intake) and increase physical activity as tolerated.  Bring a copy of your living will and/or healthcare power of attorney to your next office visit.  Continue doing brain stimulating activities (puzzles, reading, adult coloring books, staying active) to keep memory sharp.   Please schedule mammogram and bone density as discussed   Latoya [name] , Thank you for taking time to come for your Medicare Wellness Visit. I appreciate your ongoing commitment to your health goals. Please review the following plan we discussed and let me know if I can assist you in the future.   These are the goals we discussed: Goals    . Increase physical activity     Return to water aerobics tolerated.    . Weight (lb) < 200 lb (90.7 kg)     Goal weight 175lb       This is a list of the screening recommended for you and due dates:  Health Maintenance  Topic Date Due  . Flu Shot  07/14/2018*  . Mammogram  01/31/2019  . Colon Cancer Screening  01/20/2021  . Tetanus Vaccine  01/29/2027  . DEXA scan (bone density measurement)  Completed  .  Hepatitis C: One time screening is recommended by Center for Disease Control  (CDC) for  adults born from [birth year] through 1964.   Completed  . Pneumonia vaccines  Completed  *Topic was postponed. The date shown is not the original due date.    Health Maintenance for Postmenopausal Women Menopause is a normal process in which your reproductive ability comes to an end. This process happens gradually over a span of months to years, usually between the ages of 75 and 13. Menopause is complete when you have missed 12 consecutive menstrual periods. It is important to talk with your health care provider about some of the most common conditions that affect postmenopausal women, such as heart  disease, cancer, and bone loss (osteoporosis). Adopting a healthy lifestyle and getting preventive care can help to promote your health and wellness. Those actions can also lower your chances of developing some of these common conditions. What should I know about menopause? During menopause, you may experience a number of symptoms, such as:  Moderate-to-severe hot flashes.  Night sweats.  Decrease in sex drive.  Mood swings.  Headaches.  Tiredness.  Irritability.  Memory problems.  Insomnia.  Choosing to treat or not to treat menopausal changes is an individual decision that you make with your health care provider. What should I know about hormone replacement therapy and supplements? Hormone therapy products are effective for treating symptoms that are associated with menopause, such as hot flashes and night sweats. Hormone replacement carries certain risks, especially as you become older. If you are thinking about using estrogen or estrogen with progestin treatments, discuss the benefits and risks with your health care provider. What should I know about heart disease and stroke? Heart disease, heart attack, and stroke become more likely as you age. This may be due, in part, to the hormonal changes that your body experiences during menopause. These can affect how your body processes dietary fats, triglycerides, and cholesterol. Heart attack and stroke are both medical emergencies. There are many things that you can do to help prevent heart disease and stroke:  Have your blood pressure checked at least every 1-2  years. High blood pressure causes heart disease and increases the risk of stroke.  If you are 36-29 years old, ask your health care provider if you should take aspirin to prevent a heart attack or a stroke.  Do not use any tobacco products, including cigarettes, chewing tobacco, or electronic cigarettes. If you need help quitting, ask your health care provider.  It is  important to eat a healthy diet and maintain a healthy weight. ? Be sure to include plenty of vegetables, fruits, low-fat dairy products, and lean protein. ? Avoid eating foods that are high in solid fats, added sugars, or salt (sodium).  Get regular exercise. This is one of the most important things that you can do for your health. ? Try to exercise for at least 150 minutes each week. The type of exercise that you do should increase your heart rate and make you sweat. This is known as moderate-intensity exercise. ? Try to do strengthening exercises at least twice each week. Do these in addition to the moderate-intensity exercise.  Know your numbers.Ask your health care provider to check your cholesterol and your blood glucose. Continue to have your blood tested as directed by your health care provider.  What should I know about cancer screening? There are several types of cancer. Take the following steps to reduce your risk and to catch any cancer development as early as possible. Breast Cancer  Practice breast self-awareness. ? This means understanding how your breasts normally appear and feel. ? It also means doing regular breast self-exams. Let your health care provider know about any changes, no matter how small.  If you are 40 or older, have a clinician do a breast exam (clinical breast exam or CBE) every year. Depending on your age, family history, and medical history, it may be recommended that you also have a yearly breast X-ray (mammogram).  If you have a family history of breast cancer, talk with your health care provider about genetic screening.  If you are at high risk for breast cancer, talk with your health care provider about having an MRI and a mammogram every year.  Breast cancer (BRCA) gene test is recommended for women who have family members with BRCA-related cancers. Results of the assessment will determine the need for genetic counseling and BRCA1 and for BRCA2  testing. BRCA-related cancers include these types: ? Breast. This occurs in males or females. ? Ovarian. ? Tubal. This may also be called fallopian tube cancer. ? Cancer of the abdominal or pelvic lining (peritoneal cancer). ? Prostate. ? Pancreatic.  Cervical, Uterine, and Ovarian Cancer Your health care provider may recommend that you be screened regularly for cancer of the pelvic organs. These include your ovaries, uterus, and vagina. This screening involves a pelvic exam, which includes checking for microscopic changes to the surface of your cervix (Pap test).  For women ages 21-65, health care providers may recommend a pelvic exam and a Pap test every three years. For women ages 63-65, they may recommend the Pap test and pelvic exam, combined with testing for human papilloma virus (HPV), every five years. Some types of HPV increase your risk of cervical cancer. Testing for HPV may also be done on women of any age who have unclear Pap test results.  Other health care providers may not recommend any screening for nonpregnant women who are considered low risk for pelvic cancer and have no symptoms. Ask your health care provider if a screening pelvic exam is right for you.  If you have had past treatment for cervical cancer or a condition that could lead to cancer, you need Pap tests and screening for cancer for at least 20 years after your treatment. If Pap tests have been discontinued for you, your risk factors (such as having a new sexual partner) need to be reassessed to determine if you should start having screenings again. Some women have medical problems that increase the chance of getting cervical cancer. In these cases, your health care provider may recommend that you have screening and Pap tests more often.  If you have a family history of uterine cancer or ovarian cancer, talk with your health care provider about genetic screening.  If you have vaginal bleeding after reaching  menopause, tell your health care provider.  There are currently no reliable tests available to screen for ovarian cancer.  Lung Cancer Lung cancer screening is recommended for adults 6-47 years old who are at high risk for lung cancer because of a history of smoking. A yearly low-dose CT scan of the lungs is recommended if you:  Currently smoke.  Have a history of at least 30 pack-years of smoking and you currently smoke or have quit within the past 15 years. A pack-year is smoking an average of one pack of cigarettes per day for one year.  Yearly screening should:  Continue until it has been 15 years since you quit.  Stop if you develop a health problem that would prevent you from having lung cancer treatment.  Colorectal Cancer  This type of cancer can be detected and can often be prevented.  Routine colorectal cancer screening usually begins at age 70 and continues through age 6.  If you have risk factors for colon cancer, your health care provider may recommend that you be screened at an earlier age.  If you have a family history of colorectal cancer, talk with your health care provider about genetic screening.  Your health care provider may also recommend using home test kits to check for hidden blood in your stool.  A small camera at the end of a tube can be used to examine your colon directly (sigmoidoscopy or colonoscopy). This is done to check for the earliest forms of colorectal cancer.  Direct examination of the colon should be repeated every 5-10 years until age 36. However, if early forms of precancerous polyps or small growths are found or if you have a family history or genetic risk for colorectal cancer, you may need to be screened more often.  Skin Cancer  Check your skin from head to toe regularly.  Monitor any moles. Be sure to tell your health care provider: ? About any new moles or changes in moles, especially if there is a change in a mole's shape or  color. ? If you have a mole that is larger than the size of a pencil eraser.  If any of your family members has a history of skin cancer, especially at a young age, talk with your health care provider about genetic screening.  Always use sunscreen. Apply sunscreen liberally and repeatedly throughout the day.  Whenever you are outside, protect yourself by wearing long sleeves, pants, a wide-brimmed hat, and sunglasses.  What should I know about osteoporosis? Osteoporosis is a condition in which bone destruction happens more quickly than new bone creation. After menopause, you may be at an increased risk for osteoporosis. To help prevent osteoporosis or the bone fractures that can happen because of osteoporosis, the following is  recommended:  If you are 48-58 years old, get at least 1,000 mg of calcium and at least 600 mg of vitamin D per day.  If you are older than age 23 but younger than age 74, get at least 1,200 mg of calcium and at least 600 mg of vitamin D per day.  If you are older than age 35, get at least 1,200 mg of calcium and at least 800 mg of vitamin D per day.  Smoking and excessive alcohol intake increase the risk of osteoporosis. Eat foods that are rich in calcium and vitamin D, and do weight-bearing exercises several times each week as directed by your health care provider. What should I know about how menopause affects my mental health? Depression may occur at any age, but it is more common as you become older. Common symptoms of depression include:  Low or sad mood.  Changes in sleep patterns.  Changes in appetite or eating patterns.  Feeling an overall lack of motivation or enjoyment of activities that you previously enjoyed.  Frequent crying spells.  Talk with your health care provider if you think that you are experiencing depression. What should I know about immunizations? It is important that you get and maintain your immunizations. These include:  Tetanus,  diphtheria, and pertussis (Tdap) booster vaccine.  Influenza every year before the flu season begins.  Pneumonia vaccine.  Shingles vaccine.  Your health care provider may also recommend other immunizations. This information is not intended to replace advice given to you by your health care provider. Make sure you discuss any questions you have with your health care provider. Document Released: 11/30/2005 Document Revised: 04/27/2016 Document Reviewed: 07/12/2015 Elsevier Interactive Patient Education  2018 [employer].

## 2018-06-26 ENCOUNTER — Ambulatory Visit (INDEPENDENT_AMBULATORY_CARE_PROVIDER_SITE_OTHER): Payer: Medicare Other | Admitting: Podiatry

## 2018-06-26 ENCOUNTER — Encounter: Payer: Self-pay | Admitting: Podiatry

## 2018-06-26 DIAGNOSIS — M722 Plantar fascial fibromatosis: Secondary | ICD-10-CM | POA: Diagnosis not present

## 2018-06-26 MED ORDER — TRIAMCINOLONE ACETONIDE 10 MG/ML IJ SUSP
10.0000 mg | Freq: Once | INTRAMUSCULAR | Status: AC
  Administered 2018-06-26: 10 mg

## 2018-06-30 NOTE — Progress Notes (Signed)
Subjective:   Patient ID: Latoya [name], female   DOB: 72 y.o.   MRN: [id number]   HPI Patient presents stating she is still having pain in her left heel with improvement but still sore if she does a lot of walking   ROS      Objective:  Physical Exam  Neurovascular status intact with inflammation pain still noted plantar heel left with improvement with pain upon deep palpation     Assessment:  Acute plantar fasciitis left heel     Plan:  We inject the plantar fascial left 3 mg Kenalog 5 mg Xylocaine and instructed on physical therapy and supportive shoe gear usage.  Reappoint for [place] to recheck again in the next several weeks

## 2018-06-30 NOTE — Progress Notes (Signed)
This encounter was created in error - please disregard.

## 2018-07-16 DIAGNOSIS — M963 Postlaminectomy kyphosis: Secondary | ICD-10-CM | POA: Diagnosis not present

## 2018-07-16 DIAGNOSIS — M4722 Other spondylosis with radiculopathy, cervical region: Secondary | ICD-10-CM | POA: Diagnosis not present

## 2018-07-16 DIAGNOSIS — G894 Chronic pain syndrome: Secondary | ICD-10-CM | POA: Diagnosis not present

## 2018-07-16 DIAGNOSIS — Z79891 Long term (current) use of opiate analgesic: Secondary | ICD-10-CM | POA: Diagnosis not present

## 2018-07-18 ENCOUNTER — Ambulatory Visit: Payer: Self-pay

## 2018-07-18 DIAGNOSIS — M722 Plantar fascial fibromatosis: Secondary | ICD-10-CM

## 2018-07-18 DIAGNOSIS — B351 Tinea unguium: Secondary | ICD-10-CM

## 2018-07-23 NOTE — Progress Notes (Signed)
Patient is here today stating that she has pain in her left heel, this is been ongoing for couple weeks.  She said she is tried injections, physical therapy, and supportive shoe gear, but the pain is not improving.  Recently diagnosed with plantar fasciitis.  Tenderness on palpation mid to medial plantar fascial heel band.  ESWT administered at 4.3 J and tolerated well.  No epat was performed.  Advised patient to utilize her boot and supportive shoes, avoid NSAIDs and ice.  She is to follow-up next week for second treatment.

## 2018-07-25 ENCOUNTER — Ambulatory Visit: Payer: Self-pay

## 2018-07-25 DIAGNOSIS — B351 Tinea unguium: Secondary | ICD-10-CM

## 2018-07-25 DIAGNOSIS — M722 Plantar fascial fibromatosis: Secondary | ICD-10-CM

## 2018-07-29 ENCOUNTER — Telehealth: Payer: Self-pay | Admitting: Podiatry

## 2018-07-29 NOTE — Telephone Encounter (Signed)
Pt is having a lot of pain after EPAT, pt wondering if she should skip her next appt.

## 2018-07-30 NOTE — Telephone Encounter (Signed)
Spoke with patient informing her that she was having a " flare up" reaction to the treatment and that this was normal and should subside in a couple days. If there is no improvement then she is to make an appt to see Dr Paulla Dolly. We will also extended her treatment to next week instead of this week.

## 2018-07-31 ENCOUNTER — Encounter: Payer: Self-pay | Admitting: Cardiovascular Disease

## 2018-07-31 ENCOUNTER — Other Ambulatory Visit: Payer: Medicare Other

## 2018-07-31 ENCOUNTER — Ambulatory Visit (INDEPENDENT_AMBULATORY_CARE_PROVIDER_SITE_OTHER): Payer: Medicare Other | Admitting: Cardiovascular Disease

## 2018-07-31 VITALS — BP 148/80 | HR 79 | Ht 62.0 in | Wt 199.0 lb

## 2018-07-31 DIAGNOSIS — I10 Essential (primary) hypertension: Secondary | ICD-10-CM | POA: Diagnosis not present

## 2018-07-31 DIAGNOSIS — I6523 Occlusion and stenosis of bilateral carotid arteries: Secondary | ICD-10-CM | POA: Diagnosis not present

## 2018-07-31 DIAGNOSIS — E785 Hyperlipidemia, unspecified: Secondary | ICD-10-CM | POA: Diagnosis not present

## 2018-07-31 DIAGNOSIS — I5032 Chronic diastolic (congestive) heart failure: Secondary | ICD-10-CM

## 2018-07-31 DIAGNOSIS — I480 Paroxysmal atrial fibrillation: Secondary | ICD-10-CM

## 2018-07-31 DIAGNOSIS — I2510 Atherosclerotic heart disease of native coronary artery without angina pectoris: Secondary | ICD-10-CM | POA: Diagnosis not present

## 2018-07-31 NOTE — Progress Notes (Signed)
Chief Complaint  Patient presents with  . Follow-up    CAD    History of Present Illness: 72 yo female with history of PAF, HTN, hyperlipidemia, anti-phospholipid antibody syndrome, GERD, asthma, CAD here today for cardiac follow up. Cardiac cath in 2003 with moderate CAD (30% ostial LM stenosis, 50% mid LAD, 80% moderate sized Diagonal, 80% Circumflex). She has had no cath since then. She has chronic back pain. She has a hypercoagulable state and is on Xarelto. Most recent stress test October 2013 with no ischemia, normal LV function.  Last echo December 2018 with normal LV systolic function, grade 2 diastolic dysfunction and trivial mitral regurgitation. Carotid artery dopplers June 2018 with stable 40-59% bilateral stenosis. She was admitted to [facility] August 2017 with sepsis, ascending cholangitis complicated by respiratory failure, renal failure, CHF.  She underwent lap cholecystectomy 10/04/16. When seen in our office in December 2018, her BP was elevated and PVCs were present. Metoprolol was increased to 100 mg BID. She has been on Lasix and her weight has been controlled at home.   She is here today for follow up. The patient denies any chest pain, dyspnea, palpitations, lower extremity edema, orthopnea, PND, dizziness, near syncope or syncope.   Primary Care Physician: Darreld Mclean, MD  Past Medical History:  Diagnosis Date  . Anemia, iron deficiency   . Antiphospholipid syndrome (HCC)    with hypercoagulable state  . Asthma    extrinsic; moderate, persistant, nml spirometry 2010, nml CXR 1/08  . Bladder troubles    REPORTS INFECTIONS ON OCCASION DUE TO URETHRA MEATUS STRICTURE AT BIRTH   . CAD (coronary artery disease)    a. 50% mid LAD, 80% ostial D1 and moderate 80% mid circ by cath 2003. b. nonischemic nuc in 2013.  Marland Kitchen Cancer ([place])    basal cell removed fr. L arm   . Carotid arterial disease ([place])    a. 03/2017 showed 1-39% bilateral internal carotid stenosis  (high end range on left), >50% LECA stenosis, with recommendation for f/u duplex 03/2018.  Marland Kitchen Complication of anesthesia    cardiac arrest- in OR, at age 38 (40)y.o. during Scalenotomy in her early 20's  . Drug allergy    heparin/lovenox  . DVT (deep venous thrombosis) ([place])   . Erosive gastritis 1994  . Family history of adverse reaction to anesthesia    some family members have had trouble waking up  . GERD (gastroesophageal reflux disease)   . H/O hiatal hernia   . HLD (hyperlipidemia)   . HTN (hypertension)    Pt states her high blood pressure readings are due tot he method of measurementsMcAlhaney at [employer], manages pt.  LOV 03/2014  . Hypothyroidism   . Liver spot    cyst- - no biopsy, but told that its benign   . Moderate mitral regurgitation   . PAF (paroxysmal atrial fibrillation) ([place])   . Pulmonary embolism ([place])    related to back surgery with prior coumadin use, now off  . PVC's (premature ventricular contractions)    a. noted in 09/2017.  Marland Kitchen Spondylosis, lumbosacral    ARTHRITIS- OA     Past Surgical History:  Procedure Laterality Date  . ABDOMINAL HYSTERECTOMY     partial abdominal -1998  . BACK SURGERY     x13 cervical and lumbar thoracic spine surgery  . BREAST ENHANCEMENT SURGERY    . BREAST SURGERY     all benign cysts x3   . CARDIAC CATHETERIZATION  2005  . CHOLECYSTECTOMY N/A 10/04/2016   Procedure: LAPAROSCOPIC CHOLECYSTECTOMY;  Surgeon: Erroll Luna, MD;  Location: [facility] OR;  Service: General;  Laterality: N/A;  . cleft lip and palate repair     73 yo   . hysterectomy    . IR GENERIC HISTORICAL  06/04/2016   IR PERC CHOLECYSTOSTOMY 06/04/2016 Greggory Keen, MD [facility]  . IR GENERIC HISTORICAL  08/06/2016   IR CHOLANGIOGRAM EXISTING TUBE 08/06/2016 Aletta Edouard, MD [facility]  . IR GENERIC HISTORICAL  07/17/2016   IR RADIOLOGIST EVAL & MGMT 07/17/2016 [facility]  . JOINT REPLACEMENT    . POSTERIOR CERVICAL FUSION/FORAMINOTOMY Right  08/31/2014   Procedure: Right cervical six-seven foraminotomy, Cervical five-Thoracic one fusion, Cervical six-seven lateral mass screws;  Surgeon: Erline Levine, MD;  Location: [facility];  Service: Neurosurgery;  Laterality: Right;  . spina bifida repair     72 yo   . TONSILLECTOMY    . TOTAL KNEE ARTHROPLASTY     left    Current Outpatient Medications  Medication Sig Dispense Refill  . albuterol (PROVENTIL HFA;VENTOLIN HFA) 108 (90 Base) MCG/ACT inhaler Inhale 2 puffs into the lungs every 6 (six) hours as needed for wheezing or shortness of breath. 18 g 5  . aspirin 81 MG EC tablet Take 81 mg by mouth daily.     Marland Kitchen atorvastatin (LIPITOR) 40 MG tablet TAKE 1 TABLET ONCE DAILY. 90 tablet 3  . azelastine (ASTELIN) 137 MCG/SPRAY nasal spray Place 1 spray into the nose 2 (two) times daily.     . beclomethasone (QVAR REDIHALER) 80 MCG/ACT inhaler Inhale 2 puffs into the lungs 3 (three) times daily. 10.6 g 5  . Calcium Carb-Cholecalciferol (CALCIUM 500 +D) 500-400 MG-UNIT TABS Take 1 tablet by mouth 2 (two) times daily.    . cyclobenzaprine (FLEXERIL) 10 MG tablet Take 10 mg by mouth 3 (three) times daily as needed for muscle spasms.    Marland Kitchen docusate sodium (COLACE) 100 MG capsule Take 200 mg by mouth 2 (two) times daily.     Marland Kitchen EPINEPHrine 0.3 mg/0.3 mL IJ SOAJ injection Inject 0.3 mg into the muscle once. As needed for anaphylaxis    . furosemide (LASIX) 40 MG tablet Take 1 tablet (40 mg total) by mouth daily. 90 tablet 3  . gabapentin (NEURONTIN) 600 MG tablet Take 300-600 mg by mouth at bedtime.     . Glucosamine-Chondroitin 750-600 MG TABS Take 2 tablets by mouth 2 (two) times daily.     Marland Kitchen HYDROmorphone (DILAUDID) 4 MG tablet Take 4 mg by mouth daily.     Marland Kitchen levothyroxine (SYNTHROID, LEVOTHROID) 75 MCG tablet Take 1 tablet (75 mcg total) by mouth daily before breakfast. 90 tablet 0  . Loratadine-Pseudoephedrine (CLARITIN-D 12 HOUR PO) Take 1 tablet by mouth 2 (two) times daily.     . Melatonin 5  MG TABS Take 2.5-5 mg by mouth at bedtime as needed.    . montelukast (SINGULAIR) 10 MG tablet Take 10 mg by mouth daily.     . Multiple Vitamin (MULTIVITAMIN WITH MINERALS) TABS Take 1 tablet by mouth daily.    . nitroGLYCERIN (NITROSTAT) 0.4 MG SL tablet DISSOLVE 1 TABLET UNDER TONGUE AS NEEDED FOR CHEST PAIN,MAY REPEAT IN5 MINUTES FOR 2 DOSES. 25 tablet 6  . potassium chloride (K-DUR) 10 MEQ tablet Take 4 tablets (40 mEq total) by mouth 2 (two) times daily. 720 tablet 3  . Respiratory Therapy Supplies (FLUTTER) DEVI Use as directed 1 each 0  .  triamcinolone (NASACORT) 55 MCG/ACT nasal inhaler Place 1-2 sprays into the nose at bedtime.     . triamcinolone lotion (KENALOG) 0.1 %     . XARELTO 20 MG TABS tablet TAKE 1 TABLET ONCE A DAY WITH SUPPER. 30 tablet 5  . metoprolol tartrate (LOPRESSOR) 100 MG tablet Take 1 tablet (100 mg total) by mouth 2 (two) times daily. 180 tablet 3   No current facility-administered medications for this visit.     Allergies  Allergen Reactions  . Chlorhexidine Anaphylaxis and Itching    Sensitive to dye in Betadine & Chlorohexadine   . Enoxaparin Sodium Anaphylaxis  . Heparin Anaphylaxis  . Hornet Venom Anaphylaxis    European Hornets  . Pork-Derived Products Anaphylaxis  . Povidone-Iodine Itching and Rash    NOTE: Patient has had anaphylactic reaction to Betadine due to dyes in product. Sensitivity-" but if its wiped off she is able to tolerate betadine"   . Indomethacin Rash  . Lactose Intolerance (Gi) Diarrhea and Nausea And Vomiting    Can tolerate most [place] cheeses (Takes Lactaid)  . Phenylpropanolamine Hypertension    Social History   Socioeconomic History  . Marital status: Married    Spouse name: Not on file  . Number of children: Not on file  . Years of education: Not on file  . Highest education level: Not on file  Occupational History  . Not on file  Social Needs  . Financial resource strain: Not on file  . Food insecurity:     Worry: Not on file    Inability: Not on file  . Transportation needs:    Medical: Not on file    Non-medical: Not on file  Tobacco Use  . Smoking status: Never Smoker  . Smokeless tobacco: Never Used  . Tobacco comment: no smoking   Substance and Sexual Activity  . Alcohol use: No  . Drug use: No  . Sexual activity: Not Currently  Lifestyle  . Physical activity:    Days per week: Not on file    Minutes per session: Not on file  . Stress: Not on file  Relationships  . Social connections:    Talks on phone: Not on file    Gets together: Not on file    Attends religious service: Not on file    Active member of club or organization: Not on file    Attends meetings of clubs or organizations: Not on file    Relationship status: Not on file  . Intimate partner violence:    Fear of current or ex partner: Not on file    Emotionally abused: Not on file    Physically abused: Not on file    Forced sexual activity: Not on file  Other Topics Concern  . Not on file  Social History Narrative   Lives in [place] with husband; has had miscarriages but no children.    Disabled but exercises nearly every day (can only exericse in water - aerobics)   Takes opioids for pain relief; takes no herbal medications; has a heart healthy diet.     Family History  Problem Relation Age of Onset  . Heart disease Father        MI   . Heart disease Sister     Review of Systems:  As stated in the HPI and otherwise negative.   BP (!) 148/80   Pulse 79   Ht 5\' 2"  (1.575 m)   Wt 199 lb (90.3 kg)  BMI 36.40 kg/m   Physical Examination:  General: Well developed, well nourished, NAD  HEENT: OP clear, mucus membranes moist  SKIN: warm, dry. No rashes. Neuro: No focal deficits  Musculoskeletal: Muscle strength 5/5 all ext  Psychiatric: Mood and affect normal  Neck: No JVD, no carotid bruits, no thyromegaly, no lymphadenopathy.  Lungs:Clear bilaterally, no wheezes, rhonci, crackles Cardiovascular:  Regular rate and rhythm. No murmurs, gallops or rubs. Abdomen:Soft. Bowel sounds present. Non-tender.  Extremities: No lower extremity edema. Pulses are 2 + in the bilateral DP/PT.  Echo December 2018:  - Left ventricle: The cavity size was normal. Wall thickness was   normal. Systolic function was normal. The estimated ejection   fraction was in the range of 50% to 55%. Wall motion was normal;   there were no regional wall motion abnormalities. Features are   consistent with a pseudonormal left ventricular filling pattern,   with concomitant abnormal relaxation and increased filling   pressure (grade 2 diastolic dysfunction).  Cardiac cath 09/22/02: 1. Ventriculography was performed in the RAO projection. Overall ejection  fraction was 70%. No segmental wall motion abnormalities were  identified.  2. The left main coronary artery has, what appears to be, about 30%  narrowing at the ostium. In most views there does appear to be a patent  ostium; however, some tapered narrowing cannot be excluded.  3. The LAD proper has about 40-50% narrowing, at most, in the mid portion  beyond the diagonal takeoff. The vessel is calcified. The diagonal  takeoff itself has a long 80% stenosis at the ostium extending out into  the mid portion of the diagonal. The distal diagonal does appear to be  suitable for grafting.  4. The large circumflex has about 70-80% focal narrowing prior to the  bifurcation of the marginal.  5. The right coronary artery has some mild luminal irregularities but no  high-grade stenoses.  IMPRESSION:  1. Coronary artery disease with significant involvement and circumflex  involvement.  2. Multiple medical issues, as described above.  EKG:  EKG is ordered today. The ekg ordered today demonstrates Sinus, rate 79 bpm. PACs. LVH  Recent Labs: 09/23/2017: Hemoglobin 14.7; Magnesium 2.2; Platelets 249 04/07/2018: BUN 24; Creatinine, Ser 0.82; Potassium 4.5; Sodium  143 04/16/2018: TSH 2.10   Lipid Panel Lipid Panel     Component Value Date/Time   CHOL 179 04/16/2018 1452   TRIG 210.0 (H) 04/16/2018 1452   HDL 54.30 04/16/2018 1452   CHOLHDL 3 04/16/2018 1452   VLDL 42.0 (H) 04/16/2018 1452   LDLDIRECT 97.0 04/16/2018 1452     Wt Readings from Last 3 Encounters:  07/31/18 199 lb (90.3 kg)  06/16/18 200 lb 12.8 oz (91.1 kg)  04/16/18 203 lb (92.1 kg)     Other studies Reviewed: Additional studies/ records that were reviewed today include: . Review of the above records demonstrates:    Assessment and Plan:   1. CAD without angina: She is known to have moderate CAD by cath in 2003. LV function normal by echo in December 2018.No chest pain. Continue ASA, beta blocker and statin.     2. Atrial fib, paroxysmal: She is sinus today. Continue beta blocker and Xarelto.   3. HYPERTENSION: BP is well controlled. No changes  4. CAROTID ARTERY DISEASE: Mild bilateral carotid disease by dopplers June 2019  5. Hyperlipidemia: Lipids followed in primary care per pt. Continue statin   6. Acute on chronic diastolic CHF: Weight is stable. Continue Lasix.   Current  medicines are reviewed at length with the patient today.  The patient does not have concerns regarding medicines.  The following changes have been made:  no change  Labs/ tests ordered today include:   No orders of the defined types were placed in this encounter.  Disposition:   FU with me in 6 months  Signed, Lauree Chandler, MD 07/31/2018 3:49 PM    [address], [place], [place]  [address] Phone: [phone]; Fax: [phone]

## 2018-07-31 NOTE — Patient Instructions (Signed)

## 2018-08-01 NOTE — Progress Notes (Signed)
Patient is here today stating that she has pain in her left heel, this is been ongoing for couple weeks.  She said she is tried injections, physical therapy, and supportive shoe gear, but the pain is not improving.  Recently diagnosed with plantar fasciitis.  Tenderness on palpation mid to medial plantar fascial heel band.  ESWT administered at 6.4 J and tolerated well.  No epat was performed.  Advised patient to utilize her boot and supportive shoes, avoid NSAIDs and ice.  She is to follow-up next week for 3rd treatment.

## 2018-08-07 ENCOUNTER — Ambulatory Visit (INDEPENDENT_AMBULATORY_CARE_PROVIDER_SITE_OTHER): Payer: Medicare Other | Admitting: Podiatry

## 2018-08-07 ENCOUNTER — Encounter: Payer: Self-pay | Admitting: Podiatry

## 2018-08-07 ENCOUNTER — Other Ambulatory Visit: Payer: Self-pay | Admitting: Cardiovascular Disease

## 2018-08-07 DIAGNOSIS — M722 Plantar fascial fibromatosis: Secondary | ICD-10-CM | POA: Diagnosis not present

## 2018-08-07 DIAGNOSIS — I6523 Occlusion and stenosis of bilateral carotid arteries: Secondary | ICD-10-CM

## 2018-08-07 MED ORDER — METHYLPREDNISOLONE 4 MG PO TBPK: ORAL_TABLET | ORAL | 0 refills | Status: DC

## 2018-08-07 NOTE — Progress Notes (Signed)
Subjective:   Patient ID: Latoya [name], female   DOB: 72 y.o.   MRN: [id number]   HPI Patient presents stating that my left heel has really been sore and it is hard to walk on and it feels like it is very inflamed and I was doing okay with the shockwave but is become very sore recently   ROS      Objective:  Physical Exam  Neurovascular status intact with patient's left heel being very tender with patient having quite a bit of systemic disease which could become contributory towards the pain     Assessment:  Acute inflammatory condition left plantar heel     Plan:  I went ahead today and I applied air fracture walker to completely immobilize placed on steroids 6-day Dosepak and patient will reappoint next week to begin shockwave therapy again.  If any other issues were to occur she is to let us know immediately

## 2018-08-08 ENCOUNTER — Telehealth: Payer: Self-pay | Admitting: *Deleted

## 2018-08-08 NOTE — Telephone Encounter (Signed)
Pt states she was fitted with a boot and the top doesn't close, and her toes hang over the edge, and it hurts. I told pt to come back in for a fitting of another boot and I will inform the assistants in the clinical area to help.

## 2018-08-08 NOTE — Telephone Encounter (Signed)
Pt's husband, [name] states pt's boot is too small and they would like a call back.

## 2018-08-13 ENCOUNTER — Other Ambulatory Visit: Payer: Medicare Other

## 2018-08-14 ENCOUNTER — Ambulatory Visit: Payer: Medicare Other

## 2018-08-14 DIAGNOSIS — M722 Plantar fascial fibromatosis: Secondary | ICD-10-CM

## 2018-08-14 DIAGNOSIS — M79676 Pain in unspecified toe(s): Secondary | ICD-10-CM

## 2018-08-15 ENCOUNTER — Encounter: Payer: Self-pay | Admitting: Family Medicine

## 2018-08-15 DIAGNOSIS — E349 Endocrine disorder, unspecified: Secondary | ICD-10-CM | POA: Diagnosis not present

## 2018-08-15 DIAGNOSIS — Z1231 Encounter for screening mammogram for malignant neoplasm of breast: Secondary | ICD-10-CM | POA: Diagnosis not present

## 2018-08-15 DIAGNOSIS — Z90710 Acquired absence of both cervix and uterus: Secondary | ICD-10-CM | POA: Diagnosis not present

## 2018-08-15 DIAGNOSIS — Z8262 Family history of osteoporosis: Secondary | ICD-10-CM | POA: Diagnosis not present

## 2018-08-15 DIAGNOSIS — Z96652 Presence of left artificial knee joint: Secondary | ICD-10-CM | POA: Diagnosis not present

## 2018-08-15 DIAGNOSIS — R635 Abnormal weight gain: Secondary | ICD-10-CM | POA: Diagnosis not present

## 2018-08-15 DIAGNOSIS — Z23 Encounter for immunization: Secondary | ICD-10-CM | POA: Diagnosis not present

## 2018-08-15 DIAGNOSIS — Z780 Asymptomatic menopausal state: Secondary | ICD-10-CM | POA: Diagnosis not present

## 2018-08-15 NOTE — Progress Notes (Signed)
Patient is here today stating that she has pain in her left heel, this is been ongoing for couple weeks.  She said she is tried injections, physical therapy, and supportive shoe gear, but the pain is not improving.  Recently diagnosed with plantar fasciitis.  Tenderness on palpation mid to medial plantar fascial heel band.  ESWT administered at 7.5 J and tolerated well.  No epat was performed.  Advised patient to utilize her boot and supportive shoes, avoid NSAIDs and ice.  She is to follow-up next week for 4th treatment.

## 2018-08-18 ENCOUNTER — Telehealth: Payer: Self-pay | Admitting: *Deleted

## 2018-08-18 NOTE — Telephone Encounter (Signed)
Received Mammogram results from [place]; forwarded to provider/SLS 10/28  Received Bone Density results from [place]; forwarded to provider/SLS 10/28

## 2018-08-20 ENCOUNTER — Encounter: Payer: Self-pay | Admitting: Family Medicine

## 2018-08-25 ENCOUNTER — Telehealth: Payer: Self-pay | Admitting: *Deleted

## 2018-08-25 NOTE — Telephone Encounter (Signed)
Received Bone Density results from [place]; forwarded to provider/SLS 11/04

## 2018-08-28 ENCOUNTER — Ambulatory Visit: Payer: Self-pay

## 2018-08-28 DIAGNOSIS — M79676 Pain in unspecified toe(s): Secondary | ICD-10-CM

## 2018-08-28 DIAGNOSIS — M722 Plantar fascial fibromatosis: Secondary | ICD-10-CM

## 2018-09-02 NOTE — Progress Notes (Addendum)
Patient is here today stating that she has pain in her left heel, this is been ongoing for couple weeks.  She said she is tried injections, physical therapy, and supportive shoe gear, but the pain is not improving.  Recently diagnosed with plantar fasciitis.  She states that overall she thinks her pain is improving, but she still gets pain in the morning when she walks.  Tenderness on palpation mid to medial plantar fascial heel band.  ESWT administered at 11 J and tolerated well.  No epat was performed.  Advised patient to utilize her boot and supportive shoes, avoid NSAIDs and ice.  She is to follow-up in 2 weeks for 5th treatment if needed

## 2018-09-11 DIAGNOSIS — Z79891 Long term (current) use of opiate analgesic: Secondary | ICD-10-CM | POA: Diagnosis not present

## 2018-09-11 DIAGNOSIS — M4722 Other spondylosis with radiculopathy, cervical region: Secondary | ICD-10-CM | POA: Diagnosis not present

## 2018-09-11 DIAGNOSIS — G894 Chronic pain syndrome: Secondary | ICD-10-CM | POA: Diagnosis not present

## 2018-09-11 DIAGNOSIS — M963 Postlaminectomy kyphosis: Secondary | ICD-10-CM | POA: Diagnosis not present

## 2018-09-17 IMAGING — XA IR CHOLECYSTOSTOMY
12 series · 13 of 13 positions shown · non-contrast
Comparison: Prior cholangiogram through the catheter on 07/17/2016.

INDICATION: Status post percutaneous cholecystostomy tube placement on
06/04/2016 to treat acalculous cholecystitis and acute sepsis.

EXAM:
CHOLECYSTOSTOMY

[Series 1: fl (-) angio · 1 of 1 slices shown (1 of 12)]
[im 1/1]
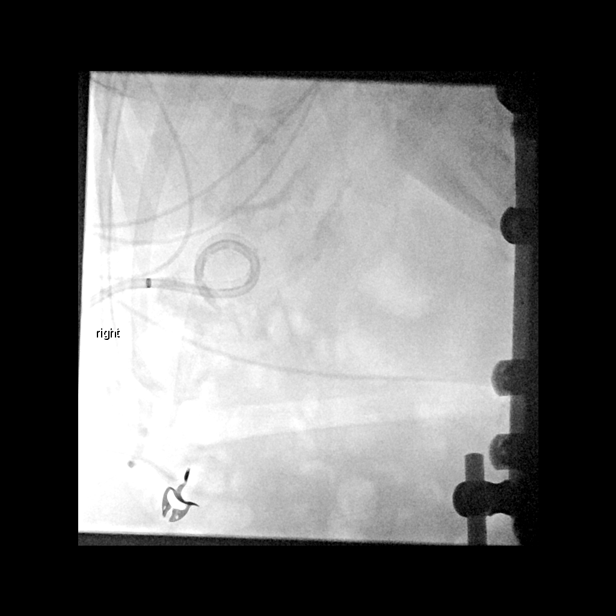

[Series 2: fl (-) angio · 1 of 1 slices shown (2 of 12)]
[im 1/1]
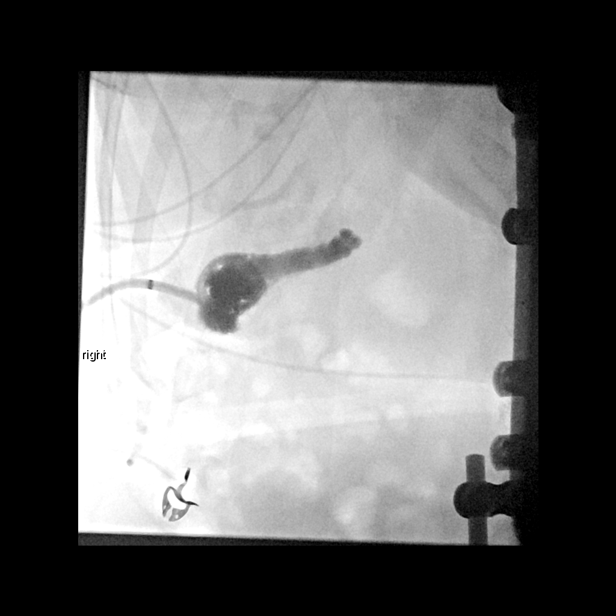

[Series 3: fl (-) angio · 1 of 1 slices shown (3 of 12)]
[im 1/1]
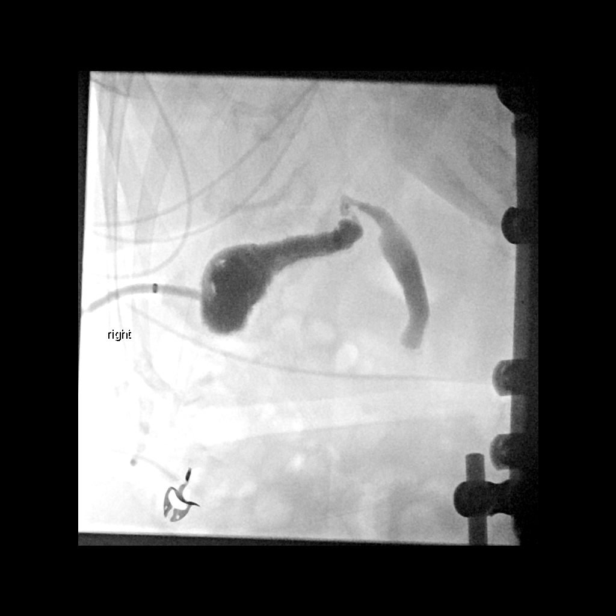

[Series 4: fl (-) angio · 1 of 1 slices shown (4 of 12)]
[im 1/1]
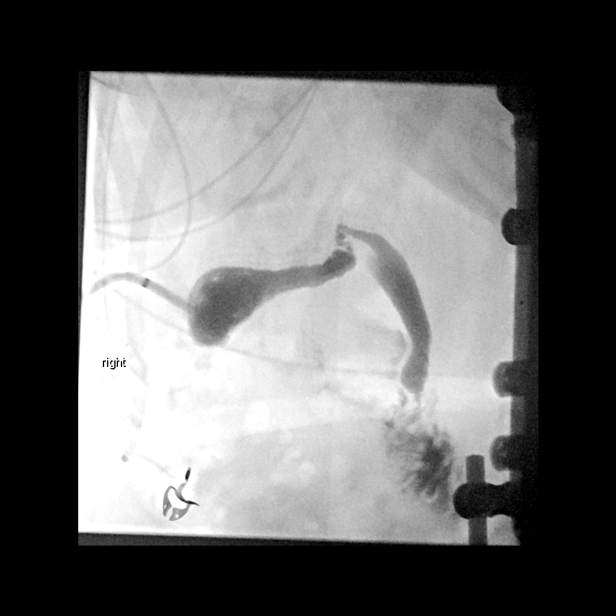

[Series 5: fl (-) angio · 1 of 1 slices shown (5 of 12)]
[im 1/1]
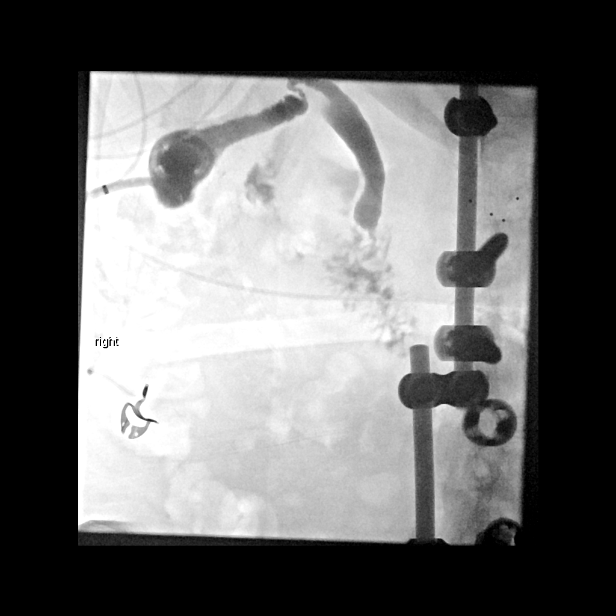

[Series 6: fl (-) angio · 1 of 1 slices shown (6 of 12)]
[im 1/1]
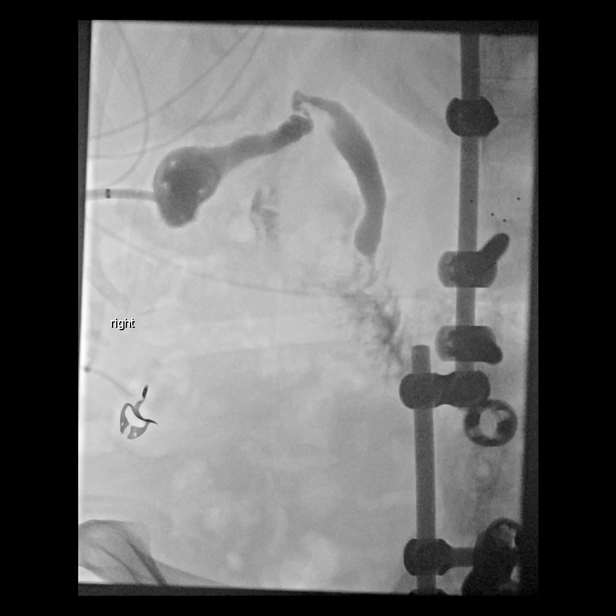

[Series 7: fl (-) angio · 1 of 1 slices shown (7 of 12)]
[im 1/1]
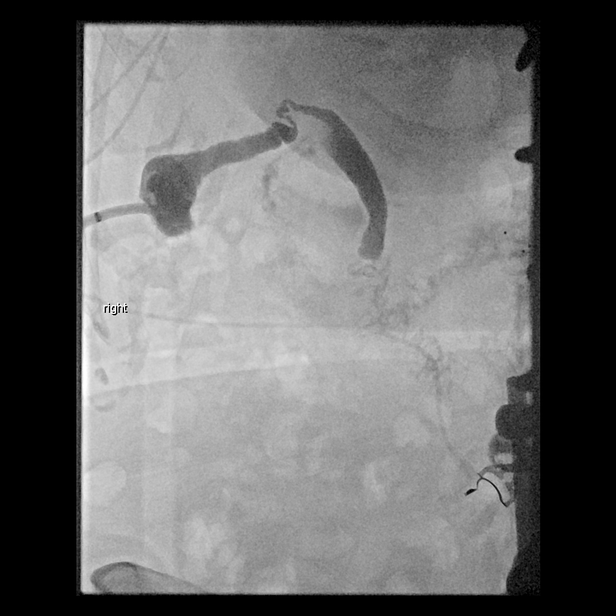

[Series 8: fl (-) angio · 1 of 1 slices shown (8 of 12)]
[im 1/1]
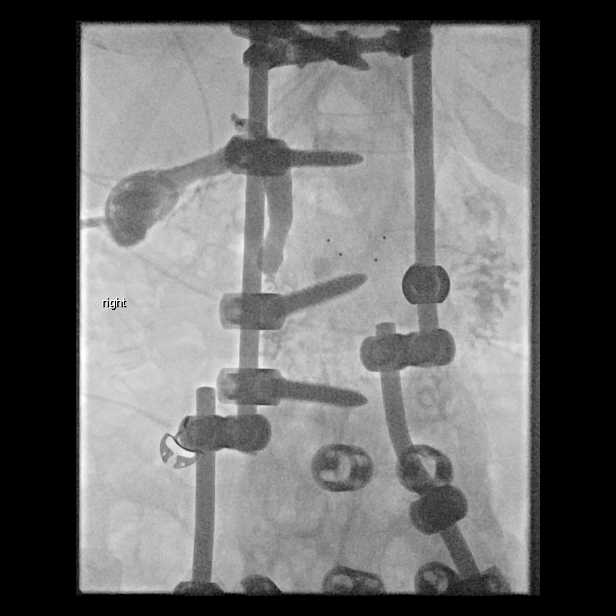

[Series 9: fl (-) angio · 1 of 1 slices shown (9 of 12)]
[im 1/1]
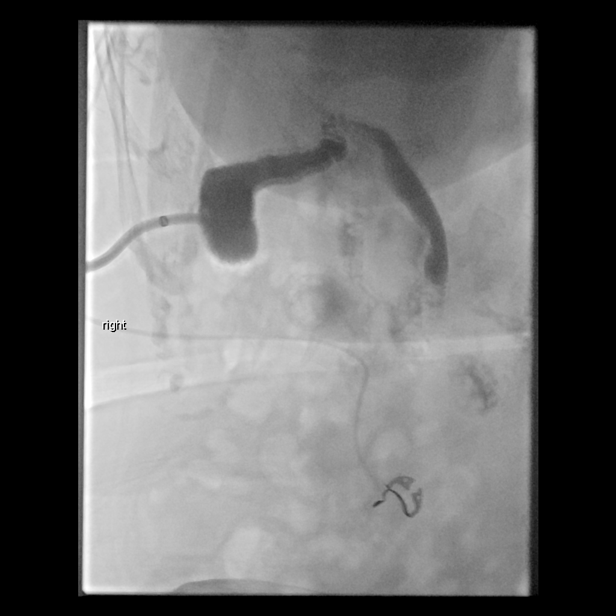

[Series 10: fl (-) angio · 1 of 1 slices shown (10 of 12)]
[im 1/1]
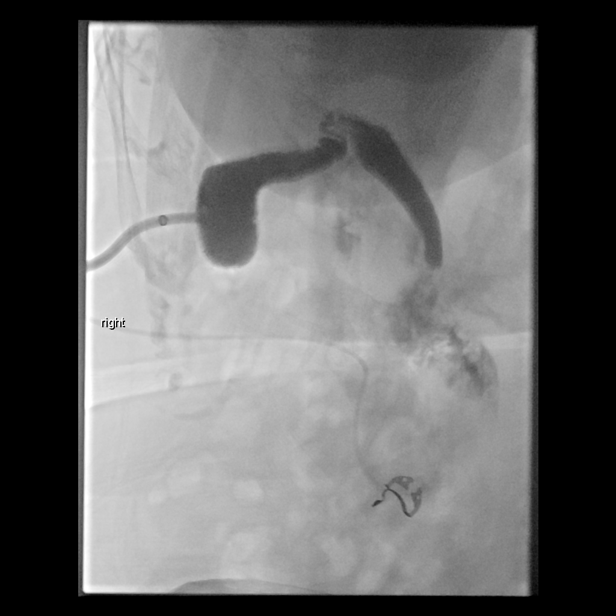

[Series 11: fl (-) angio · 2 of 2 slices shown (11 of 12)]
[im 1/2]
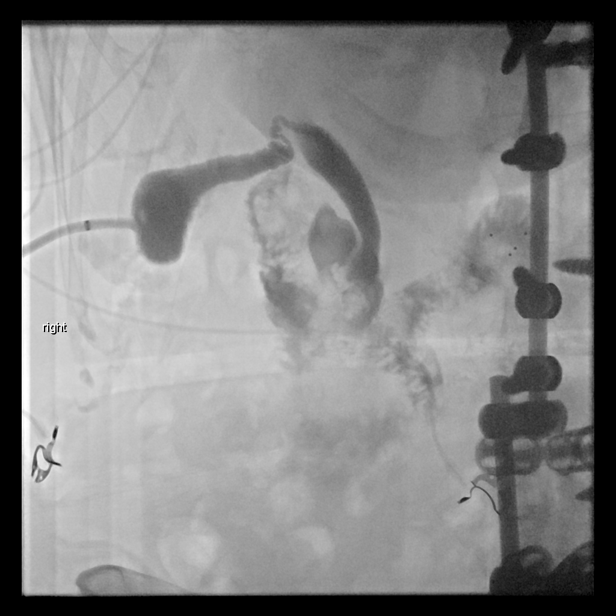
[im 2/2]
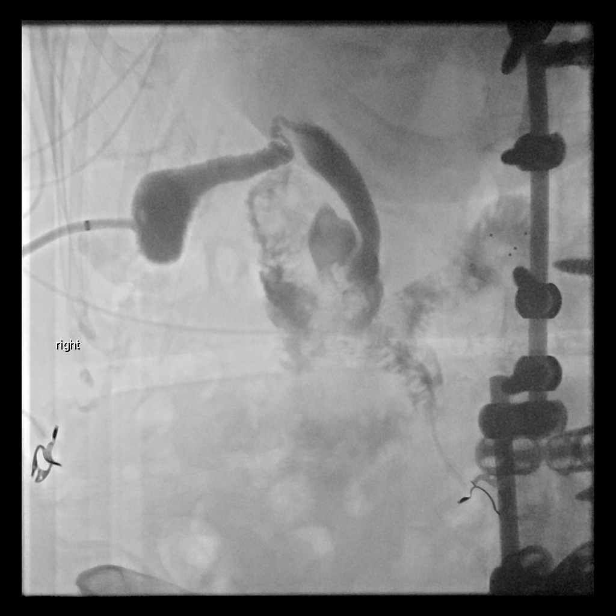

[Series 12: fl (-) angio · 1 of 1 slices shown (12 of 12)]
[im 1/1]
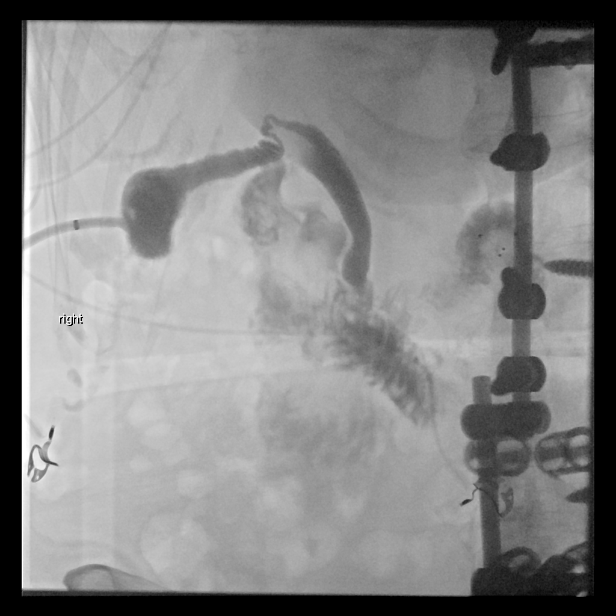

[13 of 13 positions shown; findings below may reference images not displayed]

MEDICATIONS:
None

ANESTHESIA/SEDATION:
None

FLUOROSCOPY TIME:  Fluoroscopy Time: 1 minutes.  (10 mGy).

CONTRAST:  15 mL [id number]

COMPLICATIONS:
None.

PROCEDURE:
Informed written consent was obtained from the patient after a
thorough discussion of the procedural risks, benefits and
alternatives. All questions were addressed. Maximal Sterile Barrier
Technique was utilized including caps, mask, sterile gowns, sterile
gloves, sterile drape, hand hygiene and skin antiseptic. A timeout
was performed prior to the initiation of the procedure.

Contrast was injected through the pre-existing percutaneous
cholecystostomy tube. Fluoroscopic spot images were saved. The tube
was then flushed with saline and capped. A new StatLock device was
applied at the skin exit site and new sterile dressing applied.
FINDINGS: With injection of the cholecystostomy tube, there is opacification
of a decompressed gallbladder. There is prompt drainage of contrast
via a patent cystic duct into the common bile duct. The common bile
duct is normally patent with contrast flow noted into the duodenum.
No filling defects, obstruction or contrast extravasation
identified.
IMPRESSION: Normal patency of the cystic duct and common bile duct with
cholangiogram performed through the pre-existing percutaneous
cholecystostomy tube. The catheter was capped for a trial of
internal drainage.

## 2018-09-22 DIAGNOSIS — J4530 Mild persistent asthma, uncomplicated: Secondary | ICD-10-CM | POA: Diagnosis not present

## 2018-09-22 DIAGNOSIS — Z91030 Bee allergy status: Secondary | ICD-10-CM | POA: Diagnosis not present

## 2018-09-22 DIAGNOSIS — J309 Allergic rhinitis, unspecified: Secondary | ICD-10-CM | POA: Diagnosis not present

## 2018-09-26 ENCOUNTER — Other Ambulatory Visit: Payer: Self-pay | Admitting: Physician Assistant

## 2018-10-09 DIAGNOSIS — L821 Other seborrheic keratosis: Secondary | ICD-10-CM | POA: Diagnosis not present

## 2018-10-09 DIAGNOSIS — Z85828 Personal history of other malignant neoplasm of skin: Secondary | ICD-10-CM | POA: Diagnosis not present

## 2018-10-09 DIAGNOSIS — L570 Actinic keratosis: Secondary | ICD-10-CM | POA: Diagnosis not present

## 2018-10-09 DIAGNOSIS — D1801 Hemangioma of skin and subcutaneous tissue: Secondary | ICD-10-CM | POA: Diagnosis not present

## 2018-10-09 DIAGNOSIS — D225 Melanocytic nevi of trunk: Secondary | ICD-10-CM | POA: Diagnosis not present

## 2018-10-16 ENCOUNTER — Other Ambulatory Visit: Payer: Self-pay | Admitting: Physician Assistant

## 2018-10-20 ENCOUNTER — Ambulatory Visit: Payer: Medicare Other | Admitting: Family Medicine

## 2018-10-30 ENCOUNTER — Ambulatory Visit: Payer: Medicare Other | Admitting: Family Medicine

## 2018-10-31 NOTE — Progress Notes (Deleted)
[place] [facility] [address] [place], [place] [address] [phone]  Date:  11/03/2018   Name:  Latoya [name]   DOB:  [birth year]   MRN:  [id number]  PCP:  Darreld Mclean, MD    Chief Complaint: No chief complaint on file.   History of Present Illness:  Latoya [name] is a 73 y.o. very pleasant female patient who presents with the following:  Patient is here today for periodic follow-up visit. She has history of paroxysmal A. fib, hypertension, CAD, peripheral vascular disease, hypothyroidism, Antiphospholipid syndrome, hyperlipidemia, cholecystectomy in 2017.  At the time of her gallbladder surgery, she was admitted with sepsis and ascending cholangitis f from which she has since recovered She is on chronic Xarelto  She last saw her cardiologist, Dr. Angelena Form, in October. Her most recent cardiac cath was in 2003, and she had a stress in 2013. As of her last cardiology visit, she was not having angina.  They had her continue her beta-blocker, statin, and Xarelto.  Continue Lasix for chronic CHF  I last saw this patient in June 2019, at which time she continued to have foot issues which affected her walking.  She is a podiatry patient.  At that time she had recently learned about a leg length discrepancy was planning on getting a shoe lift for the right foot  Lab Results  Component Value Date   TSH 2.10 04/16/2018   She is due for labs today, thyroid was last checked in June She also was noted to have an A1c at 6.5% in June, will need to repeat this today She has carried a diagnosis of prediabetes, but her A1c is 6.5 again we will change this to diabetes  Question [place]  Patient Active Problem List   Diagnosis Date Noted  . Spondylosis, lumbosacral   . PVC's (premature ventricular contractions)   . Pulmonary embolism ([place])   . PAF (paroxysmal atrial fibrillation) ([place])   . Moderate mitral regurgitation   .  Liver spot   . HLD (hyperlipidemia)   . HTN (hypertension)   . H/O hiatal hernia   . GERD (gastroesophageal reflux disease)   . Family history of adverse reaction to anesthesia   . DVT (deep venous thrombosis) ([place])   . Drug allergy   . Complication of anesthesia   . Carotid arterial disease ([place])   . Cancer ([place])   . CAD (coronary artery disease)   . Bladder troubles   . Antiphospholipid syndrome ([place])   . Anemia, iron deficiency   . Pre-diabetes 09/25/2016  . Leukocytosis 08/04/2016  . Nausea with vomiting 08/04/2016  . Hypothyroidism 08/04/2016  . Anemia of chronic disease 08/04/2016  . Cholangitis   . Gastroparesis   . Enterococcal bacteremia 06/06/2016  . Bacteremia due to Klebsiella pneumoniae 06/06/2016  . Bacteremia due to Escherichia coli 06/06/2016  . Ascending cholangitis 06/04/2016  . Hemangioma of liver 06/04/2016  . Cervical pseudoarthrosis ([place]) 08/31/2014  . Anaphylaxis, mild, due to wasp envenomation 04/12/2013  . Chest pain, atypical 04/11/2013  . PAD (peripheral artery disease) ([place]) 06/20/2011  . ANEMIA, IRON DEFICIENCY, MICROCYTIC 06/01/2010  . Primary hypercoagulable state ([place]) 06/01/2010  . BACK PAIN, CHRONIC 06/01/2010  . ABDOMINAL PAIN -GENERALIZED 06/01/2010  . CAD in native artery 11/15/2009  . MURMUR 11/15/2009  . CAROTID BRUIT, LEFT 11/15/2009  . Essential hypertension 03/18/2009  . History of pulmonary embolism 03/18/2009  . PAROXYSMAL ATRIAL FIBRILLATION 03/18/2009  .  DVT 03/18/2009  . Asthma 03/18/2009  . GERD 03/18/2009  . SPONDYLOSIS, LUMBAR 03/18/2009  . Hyperlipidemia 11/29/2008  . Erosive gastritis 10/22/1992    Past Medical History:  Diagnosis Date  . Anemia, iron deficiency   . Antiphospholipid syndrome (H[facility])    with hypercoagulable state  . Asthma    extrinsic; moderate, persistant, nml spirometry 2010, nml CXR 1/08  . Bladder troubles    REPORTS INFECTIONS ON OCCASION DUE TO URETHRA MEATUS STRICTURE AT BIRTH   . CAD  (coronary artery disease)    a. 50% mid LAD, 80% ostial D1 and moderate 80% mid circ by cath 2003. b. nonischemic nuc in 2013.  Marland Kitchen Cancer ([place])    basal cell removed fr. L arm   . Carotid arterial disease ([place])    a. 03/2017 showed 1-39% bilateral internal carotid stenosis (high end range on left), >50% LECA stenosis, with recommendation for f/u duplex 03/2018.  Marland Kitchen Complication of anesthesia    cardiac arrest- in OR, at age 68 (65)y.o. during Scalenotomy in her early 20's  . Drug allergy    heparin/lovenox  . DVT (deep venous thrombosis) ([place])   . Erosive gastritis 1994  . Family history of adverse reaction to anesthesia    some family members have had trouble waking up  . GERD (gastroesophageal reflux disease)   . H/O hiatal hernia   . HLD (hyperlipidemia)   . HTN (hypertension)    Pt states her high blood pressure readings are due tot he method of measurementsMcAlhaney at [employer], manages pt.  LOV 03/2014  . Hypothyroidism   . Liver spot    cyst- - no biopsy, but told that its benign   . Moderate mitral regurgitation   . PAF (paroxysmal atrial fibrillation) ([place])   . Pulmonary embolism ([place])    related to back surgery with prior coumadin use, now off  . PVC's (premature ventricular contractions)    a. noted in 09/2017.  Marland Kitchen Spondylosis, lumbosacral    ARTHRITIS- OA     Past Surgical History:  Procedure Laterality Date  . ABDOMINAL HYSTERECTOMY     partial abdominal -1998  . BACK SURGERY     x13 cervical and lumbar thoracic spine surgery  . BREAST ENHANCEMENT SURGERY    . BREAST SURGERY     all benign cysts x3   . CARDIAC CATHETERIZATION     2005  . CHOLECYSTECTOMY N/A 10/04/2016   Procedure: LAPAROSCOPIC CHOLECYSTECTOMY;  Surgeon: Erroll Luna, MD;  Location: [facility] OR;  Service: General;  Laterality: N/A;  . cleft lip and palate repair     73 yo   . hysterectomy    . IR GENERIC HISTORICAL  06/04/2016   IR PERC CHOLECYSTOSTOMY 06/04/2016 Greggory Keen, MD [facility]  . IR  GENERIC HISTORICAL  08/06/2016   IR CHOLANGIOGRAM EXISTING TUBE 08/06/2016 Aletta Edouard, MD [facility]  . IR GENERIC HISTORICAL  07/17/2016   IR RADIOLOGIST EVAL & MGMT 07/17/2016 [facility]  . JOINT REPLACEMENT    . POSTERIOR CERVICAL FUSION/FORAMINOTOMY Right 08/31/2014   Procedure: Right cervical six-seven foraminotomy, Cervical five-Thoracic one fusion, Cervical six-seven lateral mass screws;  Surgeon: Erline Levine, MD;  Location: [facility];  Service: Neurosurgery;  Laterality: Right;  . spina bifida repair     73 yo   . TONSILLECTOMY    . TOTAL KNEE ARTHROPLASTY     left    Social History   Tobacco Use  . Smoking status: Never Smoker  . Smokeless tobacco:  Never Used  . Tobacco comment: no smoking   Substance Use Topics  . Alcohol use: No  . Drug use: No    Family History  Problem Relation Age of Onset  . Heart disease Father        MI   . Heart disease Sister     Allergies  Allergen Reactions  . Chlorhexidine Anaphylaxis and Itching    Sensitive to dye in Betadine & Chlorohexadine   . Enoxaparin Sodium Anaphylaxis  . Heparin Anaphylaxis  . Hornet Venom Anaphylaxis    European Hornets  . Pork-Derived Products Anaphylaxis  . Povidone-Iodine Itching and Rash    NOTE: Patient has had anaphylactic reaction to Betadine due to dyes in product. Sensitivity-" but if its wiped off she is able to tolerate betadine"   . Indomethacin Rash  . Lactose Intolerance (Gi) Diarrhea and Nausea And Vomiting    Can tolerate most [place] cheeses (Takes Lactaid)  . Phenylpropanolamine Hypertension    Medication list has been reviewed and updated.  Current Outpatient Medications on File Prior to Visit  Medication Sig Dispense Refill  . albuterol (PROVENTIL HFA;VENTOLIN HFA) 108 (90 Base) MCG/ACT inhaler Inhale 2 puffs into the lungs every 6 (six) hours as needed for wheezing or shortness of breath. 18 g 5  . aspirin 81 MG EC tablet Take 81 mg by mouth daily.     Marland Kitchen  atorvastatin (LIPITOR) 40 MG tablet TAKE 1 TABLET ONCE DAILY. 90 tablet 3  . azelastine (ASTELIN) 137 MCG/SPRAY nasal spray Place 1 spray into the nose 2 (two) times daily.     . beclomethasone (QVAR REDIHALER) 80 MCG/ACT inhaler Inhale 2 puffs into the lungs 3 (three) times daily. 10.6 g 5  . Calcium Carb-Cholecalciferol (CALCIUM 500 +D) 500-400 MG-UNIT TABS Take 1 tablet by mouth 2 (two) times daily.    . cyclobenzaprine (FLEXERIL) 10 MG tablet Take 10 mg by mouth 3 (three) times daily as needed for muscle spasms.    Marland Kitchen docusate sodium (COLACE) 100 MG capsule Take 200 mg by mouth 2 (two) times daily.     Marland Kitchen EPINEPHrine 0.3 mg/0.3 mL IJ SOAJ injection Inject 0.3 mg into the muscle once. As needed for anaphylaxis    . furosemide (LASIX) 40 MG tablet Take 1 tablet (40 mg total) by mouth daily. 90 tablet 3  . gabapentin (NEURONTIN) 600 MG tablet Take 300-600 mg by mouth at bedtime.     . Glucosamine-Chondroitin 750-600 MG TABS Take 2 tablets by mouth 2 (two) times daily.     Marland Kitchen HYDROmorphone (DILAUDID) 4 MG tablet Take 4 mg by mouth daily.     Marland Kitchen levothyroxine (SYNTHROID, LEVOTHROID) 75 MCG tablet Take 1 tablet (75 mcg total) by mouth daily before breakfast. 90 tablet 0  . Loratadine-Pseudoephedrine (CLARITIN-D 12 HOUR PO) Take 1 tablet by mouth 2 (two) times daily.     . Melatonin 5 MG TABS Take 2.5-5 mg by mouth at bedtime as needed.    . methylPREDNISolone (MEDROL DOSEPAK) 4 MG TBPK tablet follow package directions 21 tablet 0  . metoprolol tartrate (LOPRESSOR) 100 MG tablet TAKE 1 TABLET BY MOUTH TWICE DAILY. 180 tablet 3  . montelukast (SINGULAIR) 10 MG tablet Take 10 mg by mouth daily.     . Multiple Vitamin (MULTIVITAMIN WITH MINERALS) TABS Take 1 tablet by mouth daily.    . nitroGLYCERIN (NITROSTAT) 0.4 MG SL tablet DISSOLVE 1 TABLET UNDER TONGUE AS NEEDED FOR CHEST PAIN,MAY REPEAT IN5 MINUTES FOR 2 DOSES. 25 tablet  6  . potassium chloride (K-DUR) 10 MEQ tablet TAKE 4 TABLETS TWICE DAILY. 240  tablet 8  . Respiratory Therapy Supplies (FLUTTER) DEVI Use as directed 1 each 0  . triamcinolone (NASACORT) 55 MCG/ACT nasal inhaler Place 1-2 sprays into the nose at bedtime.     . triamcinolone lotion (KENALOG) 0.1 %     . XARELTO 20 MG TABS tablet TAKE 1 TABLET ONCE A DAY WITH SUPPER. 30 tablet 5   No current facility-administered medications on file prior to visit.     Review of Systems:  As per HPI- otherwise negative.   Physical Examination: There were no vitals filed for this visit. There were no vitals filed for this visit. There is no height or weight on file to calculate BMI. Ideal Body Weight:    GEN: WDWN, NAD, Non-toxic, A & O x 3 HEENT: Atraumatic, Normocephalic. Neck supple. No masses, No LAD. Ears and Nose: No external deformity. CV: RRR, No M/G/R. No JVD. No thrill. No extra heart sounds. PULM: CTA B, no wheezes, crackles, rhonchi. No retractions. No resp. distress. No accessory muscle use. ABD: S, NT, ND, +BS. No rebound. No HSM. EXTR: No c/c/e NEURO Normal gait.  PSYCH: Normally interactive. Conversant. Not depressed or anxious appearing.  Calm demeanor.    Assessment and Plan: ***  Signed Lamar Blinks, MD

## 2018-11-03 ENCOUNTER — Ambulatory Visit: Payer: Medicare Other | Admitting: Family Medicine

## 2018-11-06 DIAGNOSIS — M963 Postlaminectomy kyphosis: Secondary | ICD-10-CM | POA: Diagnosis not present

## 2018-11-06 DIAGNOSIS — G894 Chronic pain syndrome: Secondary | ICD-10-CM | POA: Diagnosis not present

## 2018-11-06 DIAGNOSIS — M4722 Other spondylosis with radiculopathy, cervical region: Secondary | ICD-10-CM | POA: Diagnosis not present

## 2018-11-06 DIAGNOSIS — Z79891 Long term (current) use of opiate analgesic: Secondary | ICD-10-CM | POA: Diagnosis not present

## 2018-11-21 ENCOUNTER — Other Ambulatory Visit: Payer: Self-pay | Admitting: Cardiovascular Disease

## 2018-11-27 ENCOUNTER — Other Ambulatory Visit: Payer: Self-pay | Admitting: Cardiovascular Disease

## 2018-12-02 NOTE — Progress Notes (Signed)
[facility] [address] [place], [place] [address] [phone]  Date:  12/03/2018   Name:  Latoya [name] [facility]   DOB:  Sep 16, 1946   MRN:  [id number]  PCP:  Darreld Mclean, MD    Chief Complaint: Fall (11/26/2018, fell forward, hit shin, brusing left leg, redness, cellulitis)   History of Present Illness:  Latoya [name] is a 73 y.o. very pleasant female patient who presents with the following:  History of paroxysmal atrial fib, hypertension, CAD, PAD, hypercoagulable state/antiphospholipid antibody syndrome with history of DVT and PE She takes Xarelto  Most recent visit with her cardiologist was in October: 1. CAD without angina: She is known to have moderate CAD by cath in 2003. LV function normal by echo in December 2018.No chest pain. Continue ASA, beta blocker and statin.    2. Atrial fib, paroxysmal: She is sinus today. Continue beta blocker and Xarelto.  3. HYPERTENSION: BP is well controlled. No changes 4. CAROTID ARTERY DISEASE: Mild bilateral carotid disease by dopplers June 2019 5. Hyperlipidemia: Lipids followed in primary care per pt. Continue statin  6. Acute on chronic diastolic CHF: Weight is stable. Continue Lasix.   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  She went to step up and tripped, fell forward She hit her left shin on the riser step, got an abrasion which she has treated with bacitracin She developed redness surrounding the wound over the last several days, and her lower leg is swollen and painful  No fever No flu like sx   [place] in 2018 Weight down to 189 at home with Noom weight loss plan prior to this injury- she was 196 She is therefore up a few pounds, and does have history of CHF.  She is on Lasix 40 mg once daily We are not sure if this weight increase is due to swelling in her leg, or if she could have  some fluid on her lungs as well. She has not felt particularly short of breath  She does not feel that she is in atrial fib right now  Wt Readings from Last 3 Encounters:  12/03/18 200 lb (90.7 kg)  07/31/18 199 lb (90.3 kg)  06/16/18 200 lb 12.8 oz (91.1 kg)    Patient Active Problem List   Diagnosis Date Noted  . Spondylosis, lumbosacral   . PVC's (premature ventricular contractions)   . Pulmonary embolism ([place])   . PAF (paroxysmal atrial fibrillation) ([place])   . Moderate mitral regurgitation   . Liver spot   . HLD (hyperlipidemia)   . H/O hiatal hernia   . GERD (gastroesophageal reflux disease)   . Family history of adverse reaction to anesthesia   . DVT (deep venous thrombosis) ([place])   . Drug allergy   . Complication of anesthesia   . Carotid arterial disease ([place])   . Cancer ([place])   . Bladder troubles   . Antiphospholipid syndrome ([place])   . Anemia, iron deficiency   . Pre-diabetes 09/25/2016  . Leukocytosis 08/04/2016  . Nausea with vomiting 08/04/2016  . Hypothyroidism 08/04/2016  . Anemia of chronic disease 08/04/2016  . Cholangitis   . Gastroparesis   . Enterococcal bacteremia 06/06/2016  . Bacteremia due to Klebsiella pneumoniae 06/06/2016  . Bacteremia due to Escherichia coli 06/06/2016  . Ascending cholangitis 06/04/2016  .  Hemangioma of liver 06/04/2016  . Cervical pseudoarthrosis ([place]) 08/31/2014  . Anaphylaxis, mild, due to wasp envenomation 04/12/2013  . Chest pain, atypical 04/11/2013  . PAD (peripheral artery disease) ([place]) 06/20/2011  . ANEMIA, IRON DEFICIENCY, MICROCYTIC 06/01/2010  . Primary hypercoagulable state ([place]) 06/01/2010  . BACK PAIN, CHRONIC 06/01/2010  . ABDOMINAL PAIN -GENERALIZED 06/01/2010  . CAD in native artery 11/15/2009  . MURMUR 11/15/2009  . CAROTID BRUIT, LEFT 11/15/2009  . Essential hypertension 03/18/2009  . History of pulmonary embolism 03/18/2009  . PAROXYSMAL ATRIAL FIBRILLATION 03/18/2009  . DVT 03/18/2009  .  Asthma 03/18/2009  . GERD 03/18/2009  . SPONDYLOSIS, LUMBAR 03/18/2009  . Hyperlipidemia 11/29/2008  . Erosive gastritis 10/22/1992    Past Medical History:  Diagnosis Date  . Anemia, iron deficiency   . Antiphospholipid syndrome (HCC)    with hypercoagulable state  . Asthma    extrinsic; moderate, persistant, nml spirometry 2010, nml CXR 1/08  . Bladder troubles    REPORTS INFECTIONS ON OCCASION DUE TO URETHRA MEATUS STRICTURE AT BIRTH   . CAD (coronary artery disease)    a. 50% mid LAD, 80% ostial D1 and moderate 80% mid circ by cath 2003. b. nonischemic nuc in 2013.  Marland Kitchen Cancer ([place])    basal cell removed fr. L arm   . Carotid arterial disease ([place])    a. 03/2017 showed 1-39% bilateral internal carotid stenosis (high end range on left), >50% LECA stenosis, with recommendation for f/u duplex 03/2018.  Marland Kitchen Complication of anesthesia    cardiac arrest- in OR, at age 10 (33)y.o. during Scalenotomy in her early 20's  . Drug allergy    heparin/lovenox  . DVT (deep venous thrombosis) ([place])   . Erosive gastritis 1994  . Family history of adverse reaction to anesthesia    some family members have had trouble waking up  . GERD (gastroesophageal reflux disease)   . H/O hiatal hernia   . HLD (hyperlipidemia)   . HTN (hypertension)    Pt states her high blood pressure readings are due tot he method of measurementsMcAlhaney at [facility], manages pt.  LOV 03/2014  . Hypothyroidism   . Liver spot    cyst- - no biopsy, but told that its benign   . Moderate mitral regurgitation   . PAF (paroxysmal atrial fibrillation) ([place])   . Pulmonary embolism ([place])    related to back surgery with prior coumadin use, now off  . PVC's (premature ventricular contractions)    a. noted in 09/2017.  Marland Kitchen Spondylosis, lumbosacral    ARTHRITIS- OA     Past Surgical History:  Procedure Laterality Date  . ABDOMINAL HYSTERECTOMY     partial abdominal -1998  . BACK SURGERY     x13 cervical and lumbar thoracic  spine surgery  . BREAST ENHANCEMENT SURGERY    . BREAST SURGERY     all benign cysts x3   . CARDIAC CATHETERIZATION     2005  . CHOLECYSTECTOMY N/A 10/04/2016   Procedure: LAPAROSCOPIC CHOLECYSTECTOMY;  Surgeon: Erroll Luna, MD;  Location: [facility] OR;  Service: General;  Laterality: N/A;  . cleft lip and palate repair     73 yo   . hysterectomy    . IR GENERIC HISTORICAL  06/04/2016   IR PERC CHOLECYSTOSTOMY 06/04/2016 Greggory Keen, MD [facility]INTER[facility]  . IR GENERIC HISTORICAL  08/06/2016   IR CHOLANGIOGRAM EXISTING TUBE 08/06/2016 Aletta Edouard, MD [facility]  . IR GENERIC HISTORICAL  07/17/2016   IR RADIOLOGIST EVAL &  MGMT 07/17/2016 [facility] INTER[facility]  . JOINT REPLACEMENT    . POSTERIOR CERVICAL FUSION/FORAMINOTOMY Right 08/31/2014   Procedure: Right cervical six-seven foraminotomy, Cervical five-Thoracic one fusion, Cervical six-seven lateral mass screws;  Surgeon: Erline Levine, MD;  Location: [facility] ORS;  Service: Neurosurgery;  Laterality: Right;  . spina bifida repair     73 yo   . TONSILLECTOMY    . TOTAL KNEE ARTHROPLASTY     left    Social History   Tobacco Use  . Smoking status: Never Smoker  . Smokeless tobacco: Never Used  . Tobacco comment: no smoking   Substance Use Topics  . Alcohol use: No  . Drug use: No    Family History  Problem Relation Age of Onset  . Heart disease Father        MI   . Heart disease Sister     Allergies  Allergen Reactions  . Chlorhexidine Anaphylaxis and Itching    Sensitive to dye in Betadine & Chlorohexadine   . Enoxaparin Sodium Anaphylaxis  . Heparin Anaphylaxis  . Hornet Venom Anaphylaxis    European Hornets  . Pork-Derived Products Anaphylaxis  . Povidone-Iodine Itching and Rash    NOTE: Patient has had anaphylactic reaction to Betadine due to dyes in product. Sensitivity-" but if its wiped off she is able to tolerate betadine"   . Indomethacin Rash  . Lactose Intolerance (Gi) Diarrhea and Nausea And Vomiting    Can  tolerate most [place] cheeses (Takes Lactaid)  . Phenylpropanolamine Hypertension    Medication list has been reviewed and updated.  Current Outpatient Medications on File Prior to Visit  Medication Sig Dispense Refill  . albuterol (PROVENTIL HFA;VENTOLIN HFA) 108 (90 Base) MCG/ACT inhaler Inhale 2 puffs into the lungs every 6 (six) hours as needed for wheezing or shortness of breath. 18 g 5  . aspirin 81 MG EC tablet Take 81 mg by mouth daily.     Marland Kitchen atorvastatin (LIPITOR) 40 MG tablet TAKE 1 TABLET ONCE DAILY. 90 tablet 2  . azelastine (ASTELIN) 137 MCG/SPRAY nasal spray Place 1 spray into the nose 2 (two) times daily.     . beclomethasone (QVAR REDIHALER) 80 MCG/ACT inhaler Inhale 2 puffs into the lungs 3 (three) times daily. 10.6 g 5  . Calcium Carb-Cholecalciferol (CALCIUM 500 +D) 500-400 MG-UNIT TABS Take 1 tablet by mouth 2 (two) times daily.    . cyclobenzaprine (FLEXERIL) 10 MG tablet Take 10 mg by mouth 3 (three) times daily as needed for muscle spasms.    Marland Kitchen docusate sodium (COLACE) 100 MG capsule Take 200 mg by mouth 2 (two) times daily.     Marland Kitchen EPINEPHrine 0.3 mg/0.3 mL IJ SOAJ injection Inject 0.3 mg into the muscle once. As needed for anaphylaxis    . furosemide (LASIX) 40 MG tablet Take 1 tablet (40 mg total) by mouth daily. 90 tablet 3  . gabapentin (NEURONTIN) 600 MG tablet Take 300-600 mg by mouth at bedtime.     . Glucosamine-Chondroitin 750-600 MG TABS Take 2 tablets by mouth 2 (two) times daily.     Marland Kitchen HYDROmorphone (DILAUDID) 4 MG tablet Take 4 mg by mouth daily.     Marland Kitchen levothyroxine (SYNTHROID, LEVOTHROID) 75 MCG tablet Take 1 tablet (75 mcg total) by mouth daily before breakfast. 90 tablet 0  . Loratadine-Pseudoephedrine (CLARITIN-D 12 HOUR PO) Take 1 tablet by mouth 2 (two) times daily.     . Melatonin 5 MG TABS Take 2.5-5 mg by mouth at  bedtime as needed.    . metoprolol tartrate (LOPRESSOR) 100 MG tablet TAKE 1 TABLET BY MOUTH TWICE DAILY. 180 tablet 3  . montelukast  (SINGULAIR) 10 MG tablet Take 10 mg by mouth daily.     . Multiple Vitamin (MULTIVITAMIN WITH MINERALS) TABS Take 1 tablet by mouth daily.    . nitroGLYCERIN (NITROSTAT) 0.4 MG SL tablet Place 1 tablet (0.4 mg total) under the tongue every 5 (five) minutes as needed for chest pain. 25 tablet 3  . potassium chloride (K-DUR) 10 MEQ tablet TAKE 4 TABLETS TWICE DAILY. 240 tablet 8  . Respiratory Therapy Supplies (FLUTTER) DEVI Use as directed 1 each 0  . triamcinolone (NASACORT) 55 MCG/ACT nasal inhaler Place 1-2 sprays into the nose at bedtime.     . triamcinolone lotion (KENALOG) 0.1 %     . XARELTO 20 MG TABS tablet TAKE 1 TABLET ONCE A DAY WITH SUPPER. 30 tablet 5   No current facility-administered medications on file prior to visit.     Review of Systems:  As per HPI- otherwise negative.   Physical Examination: Vitals:   12/03/18 1126  BP: (!) 144/82  Pulse: 75  Resp: 18  Temp: 97.7 F (36.5 C)  SpO2: 98%   Vitals:   12/03/18 1126  Weight: 200 lb (90.7 kg)  Height: 5\' 2"  (1.575 m)   Body mass index is 36.58 kg/m. Ideal Body Weight: Weight in (lb) to have BMI = 25: 136.4  GEN: WDWN, NAD, Non-toxic, A & O x 3, overweight, kyphosis.  Otherwise appears well HEENT: Atraumatic, Normocephalic. Neck supple. No masses, No LAD. Ears and Nose: No external deformity. CV: RRR-she is in sinus rhythm at this time, No M/G/R. No JVD. No thrill. No extra heart sounds. PULM: CTA B, no wheezes, crackles, rhonchi. No retractions. No resp. distress. No accessory muscle use.Marland Kitchen EXTR: No c/c/e NEURO Normal gait for patient, she uses a walker PSYCH: Normally interactive. Conversant. Not depressed or anxious appearing.  Calm demeanor.  Left leg: There is a small abrasion over the left mid tibia.  With surrounding erythema and mild warmth.  The area is tender.  There is no induration or sign of an abscess Foot is normal  Assessment and Plan: Contusion of left lower leg, initial encounter - Plan:  DG Tibia/Fibula Left  Cellulitis of left lower extremity - Plan: sulfamethoxazole-trimethoprim (BACTRIM DS,SEPTRA DS) 800-160 MG tablet, DISCONTINUED: amoxicillin-clavulanate (AUGMENTIN) 500-125 MG tablet  Chronic diastolic congestive heart failure (HCC) - Plan: DG Chest 2 View  Here today with contusion and resultant cellulitis of the left lower leg.  We will start her on Bactrim twice a day for infection, she will let me know if is not improving.  I encouraged cool compresses and elevation to help reduce swelling.  We will get an x-ray of her left tib-fib We also note that her weight is up several pounds from her most recent home weight.  She does have a history of heart failure, I will obtain a chest x-ray  Signed Lamar Blinks, MD  Received her films as below, gave patient a call No fracture in her shin Most recent renal function in June was normal  By her home scale her weight is up 6 or 7 pounds-we are not sure if her leg swelling can explain this weight change Her chest does show vascular congestion We will increase her diuretics for a few days She is currently taking Lasix 40 mg once a day, I asked her to take  an extra 20 mg 4 to 6 hours after her usual morning dose for the next 2 or 3 days. She is taking potassium 40 mEq twice daily right now.  She asked if she should increase this.  At this point I would maintain the same potassium dose  She will continue daily weights She will come in on Friday for a basic metabolic and BNP so we can follow her progress I will then call her on the phone to check how she is doing Patient prefers her home phone, the connection is better  BP Readings from Last 3 Encounters:  12/03/18 (!) 144/82  07/31/18 (!) 148/80  06/16/18 140/88    Dg Chest 2 View  Result Date: 12/03/2018 CLINICAL DATA:  Posttraumatic lower leg swelling and erythema. History of chronic diastolic congestive heart failure. Weight gain. EXAM: CHEST - 2 VIEW COMPARISON:   11/22/2017 and 08/03/2016. FINDINGS: The heart size and mediastinal contours are stable. There is chronic asymmetric elevation of the right hemidiaphragm with associated linear bibasilar atelectasis or scarring. There is vascular congestion without overt pulmonary edema. There is no confluent airspace opacity, pleural effusion or pneumothorax. No acute osseous findings are evident. Patient is status post multilevel cervical and thoracolumbar fusion. IMPRESSION: Stable appearance of the chest with chronic bibasilar atelectasis or scarring. Vascular congestion without overt pulmonary edema. Electronically Signed   By: Richardean Sale M.D.   On: 12/03/2018 13:10   Dg Tibia/fibula Left  Result Date: 12/03/2018 CLINICAL DATA:  Lower leg injury 1 week ago.  Swelling and erythema. EXAM: LEFT TIBIA AND FIBULA - 2 VIEW COMPARISON:  Foot radiographs 03/26/2018. Knee radiographs 08/19/2005. FINDINGS: Status post left total knee arthroplasty. No evidence of hardware loosening. There is no evidence of acute fracture, dislocation or bone destruction. There are multiple soft tissue calcifications surrounding the knee which have progressed from 2006. There is diffuse edema throughout the lower leg soft tissues. No soft tissue emphysema or foreign body identified. IMPRESSION: 1. Soft tissue edema in the lower leg without evidence of foreign body. 2. No acute osseous findings or evidence of osteomyelitis. Electronically Signed   By: Richardean Sale M.D.   On: 12/03/2018 13:12

## 2018-12-03 ENCOUNTER — Ambulatory Visit (HOSPITAL_BASED_OUTPATIENT_CLINIC_OR_DEPARTMENT_OTHER)
Admission: RE | Admit: 2018-12-03 | Discharge: 2018-12-03 | Disposition: A | Discharge status: Home / Self Care | Payer: Medicare Other | Source: Ambulatory Visit | Attending: Family Medicine | Admitting: Family Medicine

## 2018-12-03 ENCOUNTER — Ambulatory Visit (INDEPENDENT_AMBULATORY_CARE_PROVIDER_SITE_OTHER): Payer: Medicare Other | Admitting: Family Medicine

## 2018-12-03 ENCOUNTER — Encounter: Payer: Self-pay | Admitting: Family Medicine

## 2018-12-03 VITALS — BP 144/82 | HR 75 | Temp 97.7°F | Resp 18 | Ht 62.0 in | Wt 200.0 lb

## 2018-12-03 DIAGNOSIS — I5032 Chronic diastolic (congestive) heart failure: Secondary | ICD-10-CM

## 2018-12-03 DIAGNOSIS — Z5181 Encounter for therapeutic drug level monitoring: Secondary | ICD-10-CM | POA: Diagnosis not present

## 2018-12-03 DIAGNOSIS — S8012XA Contusion of left lower leg, initial encounter: Secondary | ICD-10-CM

## 2018-12-03 DIAGNOSIS — R0989 Other specified symptoms and signs involving the circulatory and respiratory systems: Secondary | ICD-10-CM | POA: Diagnosis not present

## 2018-12-03 DIAGNOSIS — R600 Localized edema: Secondary | ICD-10-CM | POA: Diagnosis not present

## 2018-12-03 DIAGNOSIS — L03116 Cellulitis of left lower limb: Secondary | ICD-10-CM | POA: Diagnosis not present

## 2018-12-03 MED ORDER — SULFAMETHOXAZOLE-TRIMETHOPRIM 800-160 MG PO TABS: 1.0000 | ORAL_TABLET | Freq: Two times a day (BID) | ORAL | 0 refills | Status: DC

## 2018-12-03 MED ORDER — AMOXICILLIN-POT CLAVULANATE 500-125 MG PO TABS: 1.0000 | ORAL_TABLET | Freq: Two times a day (BID) | ORAL | 0 refills | Status: DC

## 2018-12-03 NOTE — Patient Instructions (Signed)
Please stop by the ground floor radiology dept to have x-rays of your left shin.  We will make sure there is no fracture.  For the cellulitis, take the antibiotic twice a day for 7 to 10 days.  If it is not getting better in the next 2 days, please let me know.  Let me know sooner if it is worse or if you have any fever  Elevate your leg when you can, and use cool compresses  We will also get an x-ray of your chest to make sure your lungs do not appear to have fluid

## 2018-12-04 ENCOUNTER — Other Ambulatory Visit: Payer: Self-pay

## 2018-12-04 ENCOUNTER — Ambulatory Visit: Payer: Medicare Other | Admitting: Nurse Practitioner

## 2018-12-04 ENCOUNTER — Encounter (HOSPITAL_COMMUNITY): Payer: Self-pay

## 2018-12-04 ENCOUNTER — Ambulatory Visit: Payer: Self-pay

## 2018-12-04 ENCOUNTER — Emergency Department (HOSPITAL_COMMUNITY)
Admission: EM | Admit: 2018-12-04 | Discharge: 2018-12-04 | Disposition: A | Discharge status: Home / Self Care | Payer: Medicare Other | Attending: Emergency Medicine | Admitting: Emergency Medicine

## 2018-12-04 DIAGNOSIS — I10 Essential (primary) hypertension: Secondary | ICD-10-CM | POA: Insufficient documentation

## 2018-12-04 DIAGNOSIS — L03116 Cellulitis of left lower limb: Secondary | ICD-10-CM | POA: Diagnosis not present

## 2018-12-04 DIAGNOSIS — J45909 Unspecified asthma, uncomplicated: Secondary | ICD-10-CM | POA: Diagnosis not present

## 2018-12-04 MED ORDER — SULFAMETHOXAZOLE-TRIMETHOPRIM 800-160 MG PO TABS: 1.0000 | ORAL_TABLET | Freq: Two times a day (BID) | ORAL | 0 refills | Status: AC

## 2018-12-04 NOTE — ED Provider Notes (Signed)
[facility] Emergency Department Provider Note MRN:  [id number]  Arrival date & time: 12/04/18     Chief Complaint   Referral   History of Present Illness   Latoya [name] is a 73 y.o. year-old female with a history of antiphospholipid syndrome presenting to the ED with chief complaint of referral.  [place] [address] up metal stairs 1 week ago sustaining laceration to left shin.  Since then has developed redness, swelling, pain surrounding this area.  Evaluated by PCP who started Bactrim yesterday.  Has taken 3 doses of antibiotics thus far.  Denies fever, no chest pain or shortness of breath, no abdominal pain.  Pain is constant, moderate, worse with palpation.  Review of Systems  A complete 10 system review of systems was obtained and all systems are negative except as noted in the HPI and PMH.   Patient's Health History    Past Medical History:  Diagnosis Date  . Anemia, iron deficiency   . Antiphospholipid syndrome (HCC)    with hypercoagulable state  . Asthma    extrinsic; moderate, persistant, nml spirometry 2010, nml CXR 1/08  . Bladder troubles    REPORTS INFECTIONS ON OCCASION DUE TO URETHRA MEATUS STRICTURE AT BIRTH   . CAD (coronary artery disease)    a. 50% mid LAD, 80% ostial D1 and moderate 80% mid circ by cath 2003. b. nonischemic nuc in 2013.  Marland Kitchen Cancer ([place])    basal cell removed fr. L arm   . Carotid arterial disease ([place])    a. 03/2017 showed 1-39% bilateral internal carotid stenosis (high end range on left), >50% LECA stenosis, with recommendation for f/u duplex 03/2018.  Marland Kitchen Complication of anesthesia    cardiac arrest- in OR, at age 35 (34)y.o. during Scalenotomy in her early 20's  . Drug allergy    heparin/lovenox  . DVT (deep venous thrombosis) ([place])   . Erosive gastritis 1994  . Family history of adverse reaction to anesthesia    some family members have had trouble waking up  . GERD (gastroesophageal reflux disease)   . H/O hiatal hernia   .  HLD (hyperlipidemia)   . HTN (hypertension)    Pt states her high blood pressure readings are due tot he method of measurementsMcAlhaney at [facility], manages pt.  LOV 03/2014  . Hypothyroidism   . Liver spot    cyst- - no biopsy, but told that its benign   . Moderate mitral regurgitation   . PAF (paroxysmal atrial fibrillation) ([place])   . Pulmonary embolism ([place])    related to back surgery with prior coumadin use, now off  . PVC's (premature ventricular contractions)    a. noted in 09/2017.  Marland Kitchen Spondylosis, lumbosacral    ARTHRITIS- OA     Past Surgical History:  Procedure Laterality Date  . ABDOMINAL HYSTERECTOMY     partial abdominal -1998  . BACK SURGERY     x13 cervical and lumbar thoracic spine surgery  . BREAST ENHANCEMENT SURGERY    . BREAST SURGERY     all benign cysts x3   . CARDIAC CATHETERIZATION     2005  . CHOLECYSTECTOMY N/A 10/04/2016   Procedure: LAPAROSCOPIC CHOLECYSTECTOMY;  Surgeon: Erroll Luna, MD;  Location: [facility] OR;  Service: General;  Laterality: N/A;  . cleft lip and palate repair     73 yo   . hysterectomy    . IR GENERIC HISTORICAL  06/04/2016   IR PERC CHOLECYSTOSTOMY 06/04/2016 Greggory Keen, MD [facility]  .  IR GENERIC HISTORICAL  08/06/2016   IR CHOLANGIOGRAM EXISTING TUBE 08/06/2016 Aletta Edouard, MD [facility]  . IR GENERIC HISTORICAL  07/17/2016   IR RADIOLOGIST EVAL & MGMT 07/17/2016 [facility]  . JOINT REPLACEMENT    . POSTERIOR CERVICAL FUSION/FORAMINOTOMY Right 08/31/2014   Procedure: Right cervical six-seven foraminotomy, Cervical five-Thoracic one fusion, Cervical six-seven lateral mass screws;  Surgeon: Erline Levine, MD;  Location: [facility] ORS;  Service: Neurosurgery;  Laterality: Right;  . spina bifida repair     73 yo   . TONSILLECTOMY    . TOTAL KNEE ARTHROPLASTY     left    Family History  Problem Relation Age of Onset  . Heart disease Father        MI   . Heart disease Sister     Social History   Socioeconomic  History  . Marital status: Married    Spouse name: Not on file  . Number of children: Not on file  . Years of education: Not on file  . Highest education level: Not on file  Occupational History  . Not on file  Social Needs  . Financial resource strain: Not on file  . Food insecurity:    Worry: Not on file    Inability: Not on file  . Transportation needs:    Medical: Not on file    Non-medical: Not on file  Tobacco Use  . Smoking status: Never Smoker  . Smokeless tobacco: Never Used  . Tobacco comment: no smoking   Substance and Sexual Activity  . Alcohol use: No  . Drug use: No  . Sexual activity: Not Currently  Lifestyle  . Physical activity:    Days per week: Not on file    Minutes per session: Not on file  . Stress: Not on file  Relationships  . Social connections:    Talks on phone: Not on file    Gets together: Not on file    Attends religious service: Not on file    Active member of club or organization: Not on file    Attends meetings of clubs or organizations: Not on file    Relationship status: Not on file  . Intimate partner violence:    Fear of current or ex partner: Not on file    Emotionally abused: Not on file    Physically abused: Not on file    Forced sexual activity: Not on file  Other Topics Concern  . Not on file  Social History Narrative   Lives in [place] with husband; has had miscarriages but no children.    Disabled but exercises nearly every day (can only exericse in water - aerobics)   Takes opioids for pain relief; takes no herbal medications; has a heart healthy diet.      Physical Exam  Vital Signs and Nursing Notes reviewed Vitals:   12/04/18 2144 12/04/18 2145  BP: (!) 163/81 (!) 163/81  Pulse: 97 95  Resp: 16 16  Temp: (!) 97.4 F (36.3 C)   SpO2: 100% 100%    CONSTITUTIONAL: Well-appearing, NAD NEURO:  Alert and oriented x 3, no focal deficits EYES:  eyes equal and reactive ENT/NECK:  no LAD, no JVD CARDIO: Regular rate,  well-perfused, normal S1 and S2 PULM:  CTAB no wheezing or rhonchi GI/GU:  normal bowel sounds, non-distended, non-tender MSK/SPINE:  No gross deformities, no edema SKIN: Abrasion to left mid shin with surrounding erythema and induration, no fluctuance, moderately tender to palpation PSYCH:  Appropriate speech and behavior  Diagnostic and Interventional Summary    Labs Reviewed - No data to display  No orders to display    Medications - No data to display   Procedures Critical Care  ED Course and Medical Decision Making  I have reviewed the triage vital signs and the nursing notes.  Pertinent labs & imaging results that were available during my care of the patient were reviewed by me and considered in my medical decision making (see below for details).  Consistent with cellulitis, has longstanding lower extremity edema that is largely unchanged recently.  Has only taken 3 doses of antibiotics, too early to consider this failed outpatient management.  Patient and has been state that the redness is actually improving today.  No fever, nothing to suggest sepsis.  Appropriate for continued outpatient management with Bactrim.  Has close follow-up with PCP.  Was also sent here for question of pulmonary edema, patient is endorsing no shortness of breath, no chest pain, can lay flat with no issue.  I reviewed the chest x-ray in question that was obtained yesterday, which only revealed question of vascular congestion and specifically mentioned no pulmonary edema.  Patient is already been advised to increase her dose of Lasix at home, which she will do.  I personally marked the skin of the left leg, advising her to return if the redness worsens or if she experiences fever.  After the discussed management above, the patient was determined to be safe for discharge.  The patient was in agreement with this plan and all questions regarding their care were answered.  ED return precautions were discussed  and the patient will return to the ED with any significant worsening of condition.  Barth Kirks. Sedonia Small, [place] [email or web address]  Final Clinical Impressions(s) / ED Diagnoses     ICD-10-CM   1. Cellulitis of left lower extremity L03.116     ED Discharge Orders         Ordered    sulfamethoxazole-trimethoprim (BACTRIM DS,SEPTRA DS) 800-160 MG tablet  2 times daily     12/04/18 2243             Maudie Flakes, MD 12/04/18 2321

## 2018-12-04 NOTE — Discharge Instructions (Addendum)
You were evaluated in the Emergency Department and after careful evaluation, we did not find any emergent condition requiring admission or further testing in the hospital.  Your symptoms today seem to be due to cellulitis, which is an infection of the skin.  We feel it is still appropriate to try to treat this at home with oral antibiotics.  Please take a total of 7 to 10 days of Bactrim and follow-up with your regular doctor.  We marked the skin here in the emergency department, please return if the redness spreads past the lines or if you experience fever.  Please return to the Emergency Department if you experience any worsening of your condition.  We encourage you to follow up with a primary care provider.  Thank you for allowing [place] to be a part of your care.

## 2018-12-04 NOTE — ED Triage Notes (Signed)
Pt states she fell through the church risers on Wednesday, injuring left leg, so pt went to PCP. Pt states that she had a chest x-ray at the PCP, which showed borderline pulmonary edema. Pt sent for eval of that. Pt states she is also taking abx for cellulitis in that leg.

## 2018-12-04 NOTE — Telephone Encounter (Signed)
Pt. called to report worsening symptoms of left lower leg.  Had redness and abrasion of left shin from recent fall.  Reported has noted increased redness, swelling, blistering, and pain in left lower leg, from ankle to knee since yesterday. Stated the swelling is moderate to severe.  Applied cold compress as instructed by PCP, and unable to tolerate due to chills.  Has not checked temperature today, but c/o intermittent chills.  Stated has been keeping left LE clean /dry and covered with clean/ dry gauze.   Started on Bactrim DS yesterday.  Advised no available openings with PCP office.  Also advised that she may need to be evaluated at the hospital.  Stated she preferred to be seen in the office.  Appt. scheduled @ 2:30 PM today at [facility] [place] with plan.    Reason for Disposition . [1] Swelling is painful to touch AND [2] fever  Answer Assessment - Initial Assessment Questions 1. ONSET: "When did the swelling start?" (e.g., minutes, hours, days)     Worsening swelling since yesterday 2. LOCATION: "What part of the leg is swollen?"  "Are both legs swollen or just one leg?"     Left leg from ankle to knee  3. SEVERITY: "How bad is the swelling?" (e.g., localized; mild, moderate, severe)  - Localized - small area of swelling localized to one leg  - MILD pedal edema - swelling limited to foot and ankle, pitting edema < 1/4 inch (6 mm) deep, rest and elevation eliminate most or all swelling  - MODERATE edema - swelling of lower leg to knee, pitting edema > 1/4 inch (6 mm) deep, rest and elevation only partially reduce swelling  - SEVERE edema - swelling extends above knee, facial or hand swelling present      severe 4. REDNESS: "Does the swelling look red or infected?"     Red and angry; left shin / lower leg with blisters   5. PAIN: "Is the swelling painful to touch?" If so, ask: "How painful is it?"   (Scale 1-10; mild, moderate or severe)     10/10  6. FEVER: "Do you have a fever?" If  so, ask: "What is it, how was it measured, and when did it start?"      C/o chills ; hasn't checked temperature 7. CAUSE: "What do you think is causing the leg swelling?"     Recent injury to left lower leg  8. MEDICAL HISTORY: "Do you have a history of heart failure, kidney disease, liver failure, or cancer?"     CHF 9. RECURRENT SYMPTOM: "Have you had leg swelling before?" If so, ask: "When was the last time?" "What happened that time?"     *No Answer* 10. OTHER SYMPTOMS: "Do you have any other symptoms?" (e.g., chest pain, difficulty breathing)       Worsening symptoms of left lower leg with swelling, redness, blisters, and pain  11. PREGNANCY: "Is there any chance you are pregnant?" "When was your last menstrual period?"       N/A  Protocols used: LEG SWELLING AND EDEMA-A-AH

## 2018-12-04 NOTE — ED Notes (Signed)
Wound to LLE covered with kerlex and taped.  Pt given instructions and supplies to go home with

## 2018-12-04 NOTE — ED Notes (Signed)
Bed: WA20 Expected date:  Expected time:  Means of arrival:  Comments: [place]

## 2018-12-05 ENCOUNTER — Other Ambulatory Visit: Payer: Medicare Other

## 2018-12-05 ENCOUNTER — Other Ambulatory Visit (INDEPENDENT_AMBULATORY_CARE_PROVIDER_SITE_OTHER): Payer: Medicare Other

## 2018-12-05 ENCOUNTER — Telehealth: Payer: Self-pay | Admitting: *Deleted

## 2018-12-05 ENCOUNTER — Ambulatory Visit: Payer: Self-pay | Admitting: *Deleted

## 2018-12-05 DIAGNOSIS — I5032 Chronic diastolic (congestive) heart failure: Secondary | ICD-10-CM | POA: Diagnosis not present

## 2018-12-05 DIAGNOSIS — Z5181 Encounter for therapeutic drug level monitoring: Secondary | ICD-10-CM | POA: Diagnosis not present

## 2018-12-05 NOTE — Telephone Encounter (Signed)
Patient has a lab appt today at 2pm to check her potassium and she states that she did not take her potassium yesterday because she went into the hospital.  She wanted to know if she should keep appt.   Advise to keep lab appt today.

## 2018-12-05 NOTE — Addendum Note (Signed)
Addended by: Caffie Pinto on: 12/05/2018 03:33 PM   Modules accepted: Orders

## 2018-12-06 LAB — BASIC METABOLIC PANEL
BUN/Creatinine Ratio: 17 (calc) (ref 6–22)
BUN: 18 mg/dL (ref 7–25)
CO2: 28 mmol/L (ref 20–32)
Calcium: 10.1 mg/dL (ref 8.6–10.4)
Chloride: 99 mmol/L (ref 98–110)
Creat: 1.05 mg/dL — ABNORMAL HIGH (ref 0.60–0.93)
Glucose, Bld: 101 mg/dL — ABNORMAL HIGH (ref 65–99)
POTASSIUM: 4.1 mmol/L (ref 3.5–5.3)
Sodium: 139 mmol/L (ref 135–146)

## 2018-12-06 LAB — BRAIN NATRIURETIC PEPTIDE: Brain Natriuretic Peptide: 36 pg/mL (ref <100)

## 2018-12-07 ENCOUNTER — Telehealth: Payer: Self-pay | Admitting: Family Medicine

## 2018-12-07 NOTE — Telephone Encounter (Signed)
Received her labs  Results for orders placed or performed in visit on 12/05/18  B Nat Peptide  Result Value Ref Range   Brain Natriuretic Peptide 36 <100 pg/mL  Basic metabolic panel  Result Value Ref Range   Glucose, Bld 101 (H) 65 - 99 mg/dL   BUN 18 7 - 25 mg/dL   Creat 1.05 (H) 0.60 - 0.93 mg/dL   BUN/Creatinine Ratio 17 6 - 22 (calc)   Sodium 139 135 - 146 mmol/L   Potassium 4.1 3.5 - 5.3 mmol/L   Chloride 99 98 - 110 mmol/L   CO2 28 20 - 32 mmol/L   Calcium 10.1 8.6 - 10.4 mg/dL   Called her to discuss Taking more lasix does seem to be helping with her leg swelling Overall her leg looks better and she is feeling better Her BNP is negative - no suggestion of CHF Her weight continues to bounce up and down by a few lbs She will take an extra 20 of lasix every other day for the next week or so She will keep me posted about her leg and then will see me this week if her leg is not continuing to get better

## 2018-12-11 ENCOUNTER — Encounter: Payer: Self-pay | Admitting: Family Medicine

## 2018-12-18 ENCOUNTER — Encounter: Payer: Self-pay | Admitting: Family Medicine

## 2018-12-18 ENCOUNTER — Other Ambulatory Visit: Payer: Self-pay | Admitting: Family Medicine

## 2018-12-18 DIAGNOSIS — E039 Hypothyroidism, unspecified: Secondary | ICD-10-CM

## 2018-12-19 NOTE — Telephone Encounter (Signed)
Tried refilling medication, for some reason it is hard stopping me for her dose amount. Please advise.

## 2018-12-23 ENCOUNTER — Encounter: Payer: Self-pay | Admitting: Family Medicine

## 2018-12-26 DIAGNOSIS — Z85828 Personal history of other malignant neoplasm of skin: Secondary | ICD-10-CM | POA: Diagnosis not present

## 2018-12-26 DIAGNOSIS — C44519 Basal cell carcinoma of skin of other part of trunk: Secondary | ICD-10-CM | POA: Diagnosis not present

## 2018-12-26 DIAGNOSIS — L905 Scar conditions and fibrosis of skin: Secondary | ICD-10-CM | POA: Diagnosis not present

## 2019-01-12 DIAGNOSIS — M4722 Other spondylosis with radiculopathy, cervical region: Secondary | ICD-10-CM | POA: Diagnosis not present

## 2019-01-12 DIAGNOSIS — Z79891 Long term (current) use of opiate analgesic: Secondary | ICD-10-CM | POA: Diagnosis not present

## 2019-01-12 DIAGNOSIS — M963 Postlaminectomy kyphosis: Secondary | ICD-10-CM | POA: Diagnosis not present

## 2019-01-12 DIAGNOSIS — G894 Chronic pain syndrome: Secondary | ICD-10-CM | POA: Diagnosis not present

## 2019-01-20 ENCOUNTER — Encounter: Payer: Self-pay | Admitting: Family Medicine

## 2019-01-22 MED ORDER — NYSTATIN 100000 UNIT/GM EX CREA: 1.0000 "application " | TOPICAL_CREAM | Freq: Two times a day (BID) | CUTANEOUS | 3 refills | Status: DC

## 2019-01-22 NOTE — Telephone Encounter (Signed)
Please advise on refill on nystatin-I will schedule webex visit for pt.

## 2019-01-22 NOTE — Telephone Encounter (Signed)
I sent in rx Please go ahead and schedule webex!

## 2019-01-23 MED ORDER — NYSTATIN 100000 UNIT/GM EX CREA: 1.0000 "application " | TOPICAL_CREAM | Freq: Two times a day (BID) | CUTANEOUS | 3 refills | Status: DC

## 2019-01-23 NOTE — Addendum Note (Signed)
Addended by: Kem Boroughs D on: 01/23/2019 03:00 PM   Modules accepted: Orders

## 2019-01-29 ENCOUNTER — Encounter: Payer: Self-pay | Admitting: Family Medicine

## 2019-01-29 ENCOUNTER — Other Ambulatory Visit: Payer: Self-pay | Admitting: Cardiovascular Disease

## 2019-01-30 MED ORDER — NYSTATIN 100000 UNIT/GM EX POWD: Freq: Three times a day (TID) | CUTANEOUS | 1 refills | Status: DC

## 2019-01-30 NOTE — Telephone Encounter (Signed)
Last OV 07/31/18 Scr 1.05 on 12/05/2018 Age 73 Weight 90kg Xarleto 20mg  daily sent to pharmacy crcl 61ml/min

## 2019-02-04 ENCOUNTER — Ambulatory Visit: Payer: Medicare Other | Admitting: Family Medicine

## 2019-02-09 ENCOUNTER — Ambulatory Visit: Payer: Medicare Other | Admitting: Cardiovascular Disease

## 2019-02-23 DIAGNOSIS — Z79891 Long term (current) use of opiate analgesic: Secondary | ICD-10-CM | POA: Diagnosis not present

## 2019-02-23 DIAGNOSIS — M963 Postlaminectomy kyphosis: Secondary | ICD-10-CM | POA: Diagnosis not present

## 2019-02-23 DIAGNOSIS — M4722 Other spondylosis with radiculopathy, cervical region: Secondary | ICD-10-CM | POA: Diagnosis not present

## 2019-02-23 DIAGNOSIS — G894 Chronic pain syndrome: Secondary | ICD-10-CM | POA: Diagnosis not present

## 2019-03-25 NOTE — Progress Notes (Signed)
[facility] at [facility] [address], [place], [place] [address] [phone]  Date:  03/26/2019   Name:  Latoya [name] [facility]   DOB:  [birth year]   MRN:  [id number]  PCP:  Darreld Mclean, MD    Chief Complaint: Hypothyroidism (follow up)   History of Present Illness:  Latoya [name] is a 73 y.o. very pleasant female patient who presents with the following:  In person follow-up visit today. History of paroxysmal atrial fibrillation, hypertension, CAD, PAD, hypercoagulable state due to antiphospholipid antibody syndrome, with history of DVT and PE, CHF, prediabetes, hypothyroidism She takes Xarelto for anticoagulation, aspirin, beta-blocker, statin  I saw her in the office in February, after she had hurt her shin during a fall Most recent cardiology visit was in [place] 1. CAD without angina: She is known to have moderate CAD by cath in 2003. LV function normal by echo in December 2018.No chest pain. Continue ASA, beta blocker and statin.    2. Atrial fib, paroxysmal: She is sinus today. Continue beta blocker and Xarelto.  3. HYPERTENSION: BP is well controlled. No changes 4. CAROTID ARTERY DISEASE: Mild bilateral carotid disease by dopplers June 2019 5. Hyperlipidemia: Lipids followed in primary care per pt. Continue statin  6. Acute on chronic diastolic CHF: Weight is stable. Continue Lasix.   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 She continues to follow-up with her pain management group as usual.  She has a cardiology appointment next month, virtual-she plans to ask if she can potentially stop her aspirin.  She is having a lot of dental work and would like to cut down on bleeding risk  Lab Results  Component Value Date   HGBA1C 6.5 04/16/2018   Lab Results  Component Value Date   TSH 2.10 04/16/2018    Patient Active Problem List   Diagnosis Date Noted  . Spondylosis, lumbosacral   . Pulmonary embolism ([place])   . PAF (paroxysmal atrial fibrillation) ([place])   . Moderate mitral regurgitation   . H/O hiatal hernia   . DVT (deep venous thrombosis) ([place])   . Complication of anesthesia   . Carotid arterial disease ([place])   . Cancer ([place])   . Antiphospholipid syndrome ([place])   . Anemia, iron deficiency   . Pre-diabetes 09/25/2016  . Leukocytosis 08/04/2016  . Hypothyroidism 08/04/2016  . Anemia of chronic disease 08/04/2016  . Gastroparesis   . Enterococcal bacteremia 06/06/2016  . Bacteremia due to Klebsiella pneumoniae 06/06/2016  . Bacteremia due to Escherichia coli 06/06/2016  . Ascending cholangitis 06/04/2016  . Hemangioma of liver 06/04/2016  . Cervical pseudoarthrosis ([place]) 08/31/2014  . Chest pain, atypical 04/11/2013  . PAD (peripheral artery disease) ([place]) 06/20/2011  . BACK PAIN, CHRONIC 06/01/2010  . CAD in native artery 11/15/2009  . MURMUR 11/15/2009  .  CAROTID BRUIT, LEFT 11/15/2009  . Essential hypertension 03/18/2009  . History of pulmonary embolism 03/18/2009  . Asthma 03/18/2009  . GERD 03/18/2009  . Hyperlipidemia 11/29/2008  . Erosive gastritis 10/22/1992    Past Medical History:  Diagnosis Date  . Anemia, iron deficiency   . Antiphospholipid syndrome (HCC)    with hypercoagulable state  . Asthma    extrinsic; moderate, persistant, nml spirometry 2010, nml CXR 1/08  . Bladder troubles    REPORTS  INFECTIONS ON OCCASION DUE TO URETHRA MEATUS STRICTURE AT BIRTH   . CAD (coronary artery disease)    a. 50% mid LAD, 80% ostial D1 and moderate 80% mid circ by cath 2003. b. nonischemic nuc in 2013.  Marland Kitchen Cancer ([place])    basal cell removed fr. L arm   . Carotid arterial disease ([place])    a. 03/2017 showed 1-39% bilateral internal carotid stenosis (high end range on left), >50% LECA stenosis, with recommendation for f/u duplex 03/2018.  Marland Kitchen Complication of anesthesia    cardiac arrest- in OR, at age 2 (53)y.o. during Scalenotomy in her early 20's  . Drug allergy    heparin/lovenox  . DVT (deep venous thrombosis) ([place])   . Erosive gastritis 1994  . Family history of adverse reaction to anesthesia    some family members have had trouble waking up  . GERD (gastroesophageal reflux disease)   . H/O hiatal hernia   . HLD (hyperlipidemia)   . HTN (hypertension)    Pt states her high blood pressure readings are due tot he method of measurementsMcAlhaney at [facility], manages pt.  LOV 03/2014  . Hypothyroidism   . Liver spot    cyst- - no biopsy, but told that its benign   . Moderate mitral regurgitation   . PAF (paroxysmal atrial fibrillation) ([place])   . Pulmonary embolism ([place])    related to back surgery with prior coumadin use, now off  . PVC's (premature ventricular contractions)    a. noted in 09/2017.  Marland Kitchen Spondylosis, lumbosacral    ARTHRITIS- OA     Past Surgical History:  Procedure Laterality Date  . ABDOMINAL HYSTERECTOMY     partial abdominal -1998  . BACK SURGERY     x13 cervical and lumbar thoracic spine surgery  . BREAST ENHANCEMENT SURGERY    . BREAST SURGERY     all benign cysts x3   . CARDIAC CATHETERIZATION     2005  . CHOLECYSTECTOMY N/A 10/04/2016   Procedure: LAPAROSCOPIC CHOLECYSTECTOMY;  Surgeon: Erroll Luna, MD;  Location: [facility] OR;  Service: General;  Laterality: N/A;  . cleft lip and palate repair     73 yo   . hysterectomy    . IR GENERIC HISTORICAL  06/04/2016    IR PERC CHOLECYSTOSTOMY 06/04/2016 Greggory Keen, MD [facility]INTER[facility] RAD  . IR GENERIC HISTORICAL  08/06/2016   IR CHOLANGIOGRAM EXISTING TUBE 08/06/2016 Aletta Edouard, MD [facility]  . IR GENERIC HISTORICAL  07/17/2016   IR RADIOLOGIST EVAL & MGMT 07/17/2016 [facility] INTER[facility]  . JOINT REPLACEMENT    . POSTERIOR CERVICAL FUSION/FORAMINOTOMY Right 08/31/2014   Procedure: Right cervical six-seven foraminotomy, Cervical five-Thoracic one fusion, Cervical six-seven lateral mass screws;  Surgeon: Erline Levine, MD;  Location: [place];  Service: Neurosurgery;  Laterality: Right;  . spina bifida repair     73 yo   . TONSILLECTOMY    . TOTAL KNEE ARTHROPLASTY     left    Social History   Tobacco Use  .  Smoking status: Never Smoker  . Smokeless tobacco: Never Used  . Tobacco comment: no smoking   Substance Use Topics  . Alcohol use: No  . Drug use: No    Family History  Problem Relation Age of Onset  . Heart disease Father        MI   . Heart disease Sister     Allergies  Allergen Reactions  . Chlorhexidine Anaphylaxis and Itching    Sensitive to dye in Betadine & Chlorohexadine   . Enoxaparin Sodium Anaphylaxis  . Heparin Anaphylaxis  . Hornet Venom Anaphylaxis    European Hornets  . Pork-Derived Products Anaphylaxis  . Povidone-Iodine Itching and Rash    NOTE: Patient has had anaphylactic reaction to Betadine due to dyes in product. Sensitivity-" but if its wiped off she is able to tolerate betadine"   . Indomethacin Rash  . Lactose Intolerance (Gi) Diarrhea and Nausea And Vomiting    Can tolerate most [place] cheeses (Takes Lactaid)  . Phenylpropanolamine Hypertension    Medication list has been reviewed and updated.  Current Outpatient Medications on File Prior to Visit  Medication Sig Dispense Refill  . albuterol (PROVENTIL HFA;VENTOLIN HFA) 108 (90 Base) MCG/ACT inhaler Inhale 2 puffs into the lungs every 6 (six) hours as needed for wheezing or shortness of breath.  18 g 5  . aspirin 81 MG EC tablet Take 81 mg by mouth daily.     Marland Kitchen atorvastatin (LIPITOR) 40 MG tablet TAKE 1 TABLET ONCE DAILY. (Patient taking differently: Take 40 mg by mouth daily. ) 90 tablet 2  . azelastine (ASTELIN) 137 MCG/SPRAY nasal spray Place 1 spray into the nose 2 (two) times daily.     . beclomethasone (QVAR REDIHALER) 80 MCG/ACT inhaler Inhale 2 puffs into the lungs 3 (three) times daily. 10.6 g 5  . Calcium Carb-Cholecalciferol (CALCIUM 500 +D) 500-400 MG-UNIT TABS Take 1 tablet by mouth 2 (two) times daily.    . cyclobenzaprine (FLEXERIL) 10 MG tablet Take 10 mg by mouth 3 (three) times daily as needed for muscle spasms.    Marland Kitchen docusate sodium (COLACE) 100 MG capsule Take 200 mg by mouth 2 (two) times daily.     Marland Kitchen EPINEPHrine 0.3 mg/0.3 mL IJ SOAJ injection Inject 0.3 mg into the muscle once. As needed for anaphylaxis    . furosemide (LASIX) 40 MG tablet Take 1 tablet (40 mg total) by mouth daily. 90 tablet 3  . gabapentin (NEURONTIN) 600 MG tablet Take 600-750 mg by mouth at bedtime.     . Glucosamine-Chondroitin 750-600 MG TABS Take 2 tablets by mouth 2 (two) times daily.     Marland Kitchen HYDROmorphone (DILAUDID) 4 MG tablet Take 4 mg by mouth daily.     . Loratadine-Pseudoephedrine (CLARITIN-D 12 HOUR PO) Take 1 tablet by mouth 2 (two) times daily.     . Melatonin 5 MG TABS Take 2.5-5 mg by mouth at bedtime as needed.    . metoprolol tartrate (LOPRESSOR) 100 MG tablet TAKE 1 TABLET BY MOUTH TWICE DAILY. (Patient taking differently: Take 100 mg by mouth 2 (two) times daily. ) 180 tablet 3  . montelukast (SINGULAIR) 10 MG tablet Take 10 mg by mouth daily.     . Multiple Vitamin (MULTIVITAMIN WITH MINERALS) TABS Take 1 tablet by mouth daily.    . nitroGLYCERIN (NITROSTAT) 0.4 MG SL tablet Place 1 tablet (0.4 mg total) under the tongue every 5 (five) minutes as needed for chest pain. 25 tablet 3  . nystatin (  MYCOSTATIN/NYSTOP) powder Apply topically 3 (three) times daily. Use as needed for  yeast 30 g 1  . potassium chloride (K-DUR) 10 MEQ tablet TAKE 4 TABLETS TWICE DAILY. (Patient taking differently: Take 40 mEq by mouth 2 (two) times daily. ) 240 tablet 8  . Respiratory Therapy Supplies (FLUTTER) DEVI Use as directed 1 each 0  . SYNTHROID 75 MCG tablet TAKE 1 TABLET BEFORE BREAKFAST. 90 tablet 1  . triamcinolone (NASACORT) 55 MCG/ACT nasal inhaler Place 1-2 sprays into the nose at bedtime.     . triamcinolone lotion (KENALOG) 0.1 %     . XARELTO 20 MG TABS tablet TAKE 1 TABLET ONCE A DAY WITH SUPPER. 30 tablet 5   No current facility-administered medications on file prior to visit.     Review of Systems:  As per HPI- otherwise negative. She has her typical allergies but no other symptoms  - no cough or fever  Physical Examination: Vitals:   03/26/19 1540  BP: 128/80  Pulse: 99  Resp: 18  Temp: 97.8 F (36.6 C)  SpO2: 95%   Vitals:   03/26/19 1540  Weight: 200 lb 6.4 oz (90.9 kg)  Height: 5\' 2"  (1.575 m)   Body mass index is 36.65 kg/m. Ideal Body Weight: Weight in (lb) to have BMI = 25: 136.4  GEN: WDWN, NAD, Non-toxic, A & O x 3, appears her normal self HEENT: Atraumatic, Normocephalic. Neck supple. No masses, No LAD. Ears and Nose: No external deformity. CV: RRR, No M/G/R. No JVD. No thrill. No extra heart sounds. PULM: CTA B, no wheezes, crackles, rhonchi. No retractions. No resp. distress. No accessory muscle use. EXTR: No c/c/e NEURO Normal gait for pt- she is using a walker and has significant kyphosis per her normal PSYCH: Normally interactive. Conversant. Not depressed or anxious appearing.  Calm demeanor.    Assessment and Plan: Hypothyroidism, unspecified type - Plan: TSH  Pre-diabetes - Plan: Comprehensive metabolic panel, Hemoglobin M2L  Chronic diastolic congestive heart failure (HCC)  Medication monitoring encounter  Hyperlipidemia, unspecified hyperlipidemia type - Plan: Lipid panel  Essential hypertension - Plan: CBC,  Comprehensive metabolic panel  CAD in native artery  PAF (paroxysmal atrial fibrillation) (HCC)  Candidal intertrigo - Plan: nystatin (MYCOSTATIN/NYSTOP) powder  Leg length inequality  She needs a letter to have a shoe lift for her shorter right leg- provided letter for her today  Otherwise she is stable today, have ordered labs as above to be drawn in the next few days. We will be in touch with her pending these results Colonoscopy, DEXA scan, mammogram up-to-date Suggested that she have the Shingrix vaccine   Signed Lamar Blinks, MD

## 2019-03-25 NOTE — Patient Instructions (Addendum)
It was great to see you today, I will be in touch with your labs once we get them back Please have your blood drawn at [facility] at your convenience   I would recommend you have the shingles vaccine, called Shingrix, given at your pharmacy at your convenience

## 2019-03-26 ENCOUNTER — Encounter: Payer: Self-pay | Admitting: Family Medicine

## 2019-03-26 ENCOUNTER — Ambulatory Visit (INDEPENDENT_AMBULATORY_CARE_PROVIDER_SITE_OTHER): Payer: Medicare Other | Admitting: Family Medicine

## 2019-03-26 ENCOUNTER — Other Ambulatory Visit: Payer: Self-pay

## 2019-03-26 VITALS — BP 128/80 | HR 99 | Temp 97.8°F | Resp 18 | Ht 62.0 in | Wt 200.4 lb

## 2019-03-26 DIAGNOSIS — R7303 Prediabetes: Secondary | ICD-10-CM

## 2019-03-26 DIAGNOSIS — B372 Candidiasis of skin and nail: Secondary | ICD-10-CM

## 2019-03-26 DIAGNOSIS — I2510 Atherosclerotic heart disease of native coronary artery without angina pectoris: Secondary | ICD-10-CM

## 2019-03-26 DIAGNOSIS — Z5181 Encounter for therapeutic drug level monitoring: Secondary | ICD-10-CM

## 2019-03-26 DIAGNOSIS — I5032 Chronic diastolic (congestive) heart failure: Secondary | ICD-10-CM

## 2019-03-26 DIAGNOSIS — M2170 Unequal limb length (acquired), unspecified site: Secondary | ICD-10-CM | POA: Diagnosis not present

## 2019-03-26 DIAGNOSIS — E039 Hypothyroidism, unspecified: Secondary | ICD-10-CM | POA: Diagnosis not present

## 2019-03-26 DIAGNOSIS — I10 Essential (primary) hypertension: Secondary | ICD-10-CM

## 2019-03-26 DIAGNOSIS — E785 Hyperlipidemia, unspecified: Secondary | ICD-10-CM

## 2019-03-26 DIAGNOSIS — I480 Paroxysmal atrial fibrillation: Secondary | ICD-10-CM

## 2019-03-26 MED ORDER — NYSTATIN 100000 UNIT/GM EX POWD: Freq: Three times a day (TID) | CUTANEOUS | 3 refills | Status: DC

## 2019-03-27 ENCOUNTER — Other Ambulatory Visit: Payer: Self-pay | Admitting: Cardiovascular Disease

## 2019-03-27 ENCOUNTER — Other Ambulatory Visit: Payer: Self-pay | Admitting: Family Medicine

## 2019-03-30 ENCOUNTER — Encounter: Payer: Self-pay | Admitting: Family Medicine

## 2019-03-31 ENCOUNTER — Encounter: Payer: Self-pay | Admitting: Family Medicine

## 2019-04-01 ENCOUNTER — Encounter: Payer: Self-pay | Admitting: Family Medicine

## 2019-04-02 ENCOUNTER — Other Ambulatory Visit: Payer: Medicare Other

## 2019-04-07 ENCOUNTER — Ambulatory Visit: Payer: Medicare Other

## 2019-04-08 ENCOUNTER — Other Ambulatory Visit (INDEPENDENT_AMBULATORY_CARE_PROVIDER_SITE_OTHER): Payer: Medicare Other

## 2019-04-08 ENCOUNTER — Other Ambulatory Visit: Payer: Self-pay

## 2019-04-08 DIAGNOSIS — I10 Essential (primary) hypertension: Secondary | ICD-10-CM

## 2019-04-08 DIAGNOSIS — E785 Hyperlipidemia, unspecified: Secondary | ICD-10-CM | POA: Diagnosis not present

## 2019-04-08 DIAGNOSIS — E039 Hypothyroidism, unspecified: Secondary | ICD-10-CM | POA: Diagnosis not present

## 2019-04-08 DIAGNOSIS — R7303 Prediabetes: Secondary | ICD-10-CM | POA: Diagnosis not present

## 2019-04-08 LAB — LIPID PANEL
Cholesterol: 153 mg/dL (ref 0–200)
HDL: 40.2 mg/dL (ref >39.00)
NonHDL: 112.68
Total CHOL/HDL Ratio: 4
Triglycerides: 226 mg/dL — ABNORMAL HIGH (ref 0.0–149.0)
VLDL: 45.2 mg/dL — ABNORMAL HIGH (ref 0.0–40.0)

## 2019-04-08 LAB — CBC
HCT: 41.9 % (ref 36.0–46.0)
Hemoglobin: 14 g/dL (ref 12.0–15.0)
MCHC: 33.5 g/dL (ref 30.0–36.0)
MCV: 86.2 fl (ref 78.0–100.0)
Platelets: 243 10*3/uL (ref 150.0–400.0)
RBC: 4.86 Mil/uL (ref 3.87–5.11)
RDW: 14.8 % (ref 11.5–15.5)
WBC: 6.7 10*3/uL (ref 4.0–10.5)

## 2019-04-08 LAB — COMPREHENSIVE METABOLIC PANEL
ALT: 24 U/L (ref 0–35)
AST: 21 U/L (ref 0–37)
Albumin: 4.4 g/dL (ref 3.5–5.2)
Alkaline Phosphatase: 96 U/L (ref 39–117)
BUN: 20 mg/dL (ref 6–23)
CO2: 31 mEq/L (ref 19–32)
Calcium: 9.7 mg/dL (ref 8.4–10.5)
Chloride: 98 mEq/L (ref 96–112)
Creatinine, Ser: 0.74 mg/dL (ref 0.40–1.20)
GFR: 77.12 mL/min (ref >60.00)
Glucose, Bld: 135 mg/dL — ABNORMAL HIGH (ref 70–99)
Potassium: 4.6 mEq/L (ref 3.5–5.1)
Sodium: 137 mEq/L (ref 135–145)
Total Bilirubin: 0.6 mg/dL (ref 0.2–1.2)
Total Protein: 6.7 g/dL (ref 6.0–8.3)

## 2019-04-08 LAB — TSH: TSH: 1.49 u[IU]/mL (ref 0.35–4.50)

## 2019-04-08 LAB — LDL CHOLESTEROL, DIRECT: Direct LDL: 77 mg/dL

## 2019-04-08 LAB — HEMOGLOBIN A1C: Hgb A1c MFr Bld: 6.8 % — ABNORMAL HIGH (ref 4.6–6.5)

## 2019-04-09 ENCOUNTER — Encounter: Payer: Self-pay | Admitting: Family Medicine

## 2019-04-09 DIAGNOSIS — Z85828 Personal history of other malignant neoplasm of skin: Secondary | ICD-10-CM | POA: Diagnosis not present

## 2019-04-09 DIAGNOSIS — L821 Other seborrheic keratosis: Secondary | ICD-10-CM | POA: Diagnosis not present

## 2019-04-09 DIAGNOSIS — D1801 Hemangioma of skin and subcutaneous tissue: Secondary | ICD-10-CM | POA: Diagnosis not present

## 2019-04-09 DIAGNOSIS — D225 Melanocytic nevi of trunk: Secondary | ICD-10-CM | POA: Diagnosis not present

## 2019-04-09 DIAGNOSIS — L570 Actinic keratosis: Secondary | ICD-10-CM | POA: Diagnosis not present

## 2019-04-11 ENCOUNTER — Encounter: Payer: Self-pay | Admitting: Family Medicine

## 2019-04-20 DIAGNOSIS — M4722 Other spondylosis with radiculopathy, cervical region: Secondary | ICD-10-CM | POA: Diagnosis not present

## 2019-04-20 DIAGNOSIS — M963 Postlaminectomy kyphosis: Secondary | ICD-10-CM | POA: Diagnosis not present

## 2019-04-20 DIAGNOSIS — G894 Chronic pain syndrome: Secondary | ICD-10-CM | POA: Diagnosis not present

## 2019-04-20 DIAGNOSIS — Z79891 Long term (current) use of opiate analgesic: Secondary | ICD-10-CM | POA: Diagnosis not present

## 2019-04-22 ENCOUNTER — Telehealth: Payer: Self-pay | Admitting: Cardiovascular Disease

## 2019-04-22 NOTE — Telephone Encounter (Signed)
Follow up ° ° °Patient is returning your call. Please call. ° ° ° °

## 2019-04-22 NOTE — Telephone Encounter (Signed)
New Message ° ° ° ° °Left message to confirm appt and answer COVID Questions  °

## 2019-04-22 NOTE — Telephone Encounter (Signed)

## 2019-04-23 ENCOUNTER — Other Ambulatory Visit: Payer: Self-pay

## 2019-04-23 ENCOUNTER — Ambulatory Visit (INDEPENDENT_AMBULATORY_CARE_PROVIDER_SITE_OTHER): Payer: Medicare Other | Admitting: Cardiovascular Disease

## 2019-04-23 ENCOUNTER — Encounter: Payer: Self-pay | Admitting: Cardiovascular Disease

## 2019-04-23 VITALS — BP 144/86 | HR 68 | Ht 62.0 in | Wt 199.4 lb

## 2019-04-23 DIAGNOSIS — I5032 Chronic diastolic (congestive) heart failure: Secondary | ICD-10-CM

## 2019-04-23 DIAGNOSIS — E785 Hyperlipidemia, unspecified: Secondary | ICD-10-CM

## 2019-04-23 DIAGNOSIS — I480 Paroxysmal atrial fibrillation: Secondary | ICD-10-CM | POA: Diagnosis not present

## 2019-04-23 DIAGNOSIS — I2510 Atherosclerotic heart disease of native coronary artery without angina pectoris: Secondary | ICD-10-CM

## 2019-04-23 DIAGNOSIS — I6523 Occlusion and stenosis of bilateral carotid arteries: Secondary | ICD-10-CM

## 2019-04-23 DIAGNOSIS — I10 Essential (primary) hypertension: Secondary | ICD-10-CM

## 2019-04-23 MED ORDER — METOPROLOL TARTRATE 100 MG PO TABS: 100.0000 mg | ORAL_TABLET | Freq: Two times a day (BID) | ORAL | 3 refills | Status: DC

## 2019-04-23 MED ORDER — RIVAROXABAN 20 MG PO TABS: 20.0000 mg | ORAL_TABLET | Freq: Every day | ORAL | 3 refills | Status: DC

## 2019-04-23 MED ORDER — FUROSEMIDE 40 MG PO TABS: 40.0000 mg | ORAL_TABLET | Freq: Every day | ORAL | 3 refills | Status: DC

## 2019-04-23 NOTE — Patient Instructions (Signed)
Medication Instructions:  1) STOP ASPIRIN   Follow-Up: Your provider wants you to follow-up in: 6 months with Dr. Angelena Form. You will receive a reminder letter in the mail two months in advance. If you don't receive a letter, please call our office to schedule the follow-up appointment.

## 2019-04-23 NOTE — Progress Notes (Signed)
Chief Complaint  Patient presents with   Follow-up    CAD    History of Present Illness: 73 yo female with history of PAF, HTN, hyperlipidemia, anti-phospholipid antibody syndrome, GERD, asthma, CAD here today for cardiac follow up. Cardiac cath in 2003 with moderate CAD (30% ostial LM stenosis, 50% mid LAD, 80% moderate sized Diagonal, 80% Circumflex). She has had no cath since then. She has chronic back pain. She has a hypercoagulable state and is on Xarelto. Most recent stress test October 2013 with no ischemia, normal LV function.  Last echo December 2018 with normal LV systolic function, grade 2 diastolic dysfunction and trivial mitral regurgitation. Carotid artery dopplers June 2018 with stable 40-59% bilateral stenosis. She was admitted to [facility] August 2017 with sepsis, ascending cholangitis complicated by respiratory failure, renal failure, CHF.  She underwent lap cholecystectomy 10/04/16. When seen in our office in December 2018, her BP was elevated and PVCs were present. Metoprolol was increased to 100 mg BID.   She  is here today for follow up. The patient denies any chest pain, dyspnea, palpitations, lower extremity edema, orthopnea, PND, dizziness, near syncope or syncope.    Primary Care Physician: Darreld Mclean, MD  Past Medical History:  Diagnosis Date   Anemia, iron deficiency    Antiphospholipid syndrome ([place])    with hypercoagulable state   Asthma    extrinsic; moderate, persistant, nml spirometry 2010, nml CXR 1/08   Bladder troubles    REPORTS INFECTIONS ON OCCASION DUE TO URETHRA MEATUS STRICTURE AT BIRTH    CAD (coronary artery disease)    a. 50% mid LAD, 80% ostial D1 and moderate 80% mid circ by cath 2003. b. nonischemic nuc in 2013.   Cancer ([place])    basal cell removed fr. L arm    Carotid arterial disease ([place])    a. 03/2017 showed 1-39% bilateral internal carotid stenosis (high end range on left), >50% LECA stenosis, with recommendation  for f/u duplex 03/2018.   Complication of anesthesia    cardiac arrest- in OR, at age 41 (29)y.o. during Scalenotomy in her early 20's   Drug allergy    heparin/lovenox   DVT (deep venous thrombosis) ([facility])    Erosive gastritis 1994   Family history of adverse reaction to anesthesia    some family members have had trouble waking up   GERD (gastroesophageal reflux disease)    H/O hiatal hernia    HLD (hyperlipidemia)    HTN (hypertension)    Pt states her high blood pressure readings are due tot he method of measurements[facility] at [employer], manages pt.  LOV 03/2014   Hypothyroidism    Liver spot    cyst- - no biopsy, but told that its benign    Moderate mitral regurgitation    PAF (paroxysmal atrial fibrillation) ([place])    Pulmonary embolism ([place])    related to back surgery with prior coumadin use, now off   PVC's (premature ventricular contractions)    a. noted in 09/2017.   Spondylosis, lumbosacral    ARTHRITIS- OA     Past Surgical History:  Procedure Laterality Date   ABDOMINAL HYSTERECTOMY     partial abdominal -1998   BACK SURGERY     x13 cervical and lumbar thoracic spine surgery   BREAST ENHANCEMENT SURGERY     BREAST SURGERY     all benign cysts x3    CARDIAC CATHETERIZATION     2005   CHOLECYSTECTOMY N/A 10/04/2016  Procedure: LAPAROSCOPIC CHOLECYSTECTOMY;  Surgeon: Erroll Luna, MD;  Location: [facility];  Service: General;  Laterality: N/A;   cleft lip and palate repair     73 yo    hysterectomy     IR GENERIC HISTORICAL  06/04/2016   IR PERC CHOLECYSTOSTOMY 06/04/2016 Greggory Keen, MD [facility]   IR GENERIC HISTORICAL  08/06/2016   IR CHOLANGIOGRAM EXISTING TUBE 08/06/2016 Aletta Edouard, MD [facility]   IR GENERIC HISTORICAL  07/17/2016   IR RADIOLOGIST EVAL & MGMT 07/17/2016 [facility]   JOINT REPLACEMENT     POSTERIOR CERVICAL FUSION/FORAMINOTOMY Right 08/31/2014   Procedure: Right cervical six-seven foraminotomy,  Cervical five-Thoracic one fusion, Cervical six-seven lateral mass screws;  Surgeon: Erline Levine, MD;  Location: [facility];  Service: Neurosurgery;  Laterality: Right;   spina bifida repair     73 yo    TONSILLECTOMY     TOTAL KNEE ARTHROPLASTY     left    Current Outpatient Medications  Medication Sig Dispense Refill   albuterol (PROVENTIL HFA;VENTOLIN HFA) 108 (90 Base) MCG/ACT inhaler Inhale 2 puffs into the lungs every 6 (six) hours as needed for wheezing or shortness of breath. 18 g 5   atorvastatin (LIPITOR) 40 MG tablet TAKE 1 TABLET ONCE DAILY. (Patient taking differently: Take 40 mg by mouth daily. ) 90 tablet 2   azelastine (ASTELIN) 137 MCG/SPRAY nasal spray Place 1 spray into the nose 2 (two) times daily.      beclomethasone (QVAR REDIHALER) 80 MCG/ACT inhaler Inhale 2 puffs into the lungs 3 (three) times daily. 10.6 g 5   Calcium Carb-Cholecalciferol (CALCIUM 500 +D) 500-400 MG-UNIT TABS Take 1 tablet by mouth 2 (two) times daily.     cyclobenzaprine (FLEXERIL) 10 MG tablet Take 10 mg by mouth 3 (three) times daily as needed for muscle spasms.     docusate sodium (COLACE) 100 MG capsule Take 200 mg by mouth 2 (two) times daily.      EPINEPHrine 0.3 mg/0.3 mL IJ SOAJ injection Inject 0.3 mg into the muscle once. As needed for anaphylaxis     furosemide (LASIX) 40 MG tablet Take 1 tablet (40 mg total) by mouth daily. 90 tablet 3   gabapentin (NEURONTIN) 600 MG tablet Take 600-750 mg by mouth at bedtime.      Glucosamine-Chondroitin 750-600 MG TABS Take 2 tablets by mouth 2 (two) times daily.      HYDROmorphone (DILAUDID) 4 MG tablet Take 4 mg by mouth daily.      Loratadine-Pseudoephedrine (CLARITIN-D 12 HOUR PO) Take 1 tablet by mouth 2 (two) times daily.      Melatonin 5 MG TABS Take 2.5-5 mg by mouth at bedtime as needed.     metoprolol tartrate (LOPRESSOR) 100 MG tablet Take 1 tablet (100 mg total) by mouth 2 (two) times daily. 180 tablet 3   montelukast  (SINGULAIR) 10 MG tablet TAKE 1 TABLET ONCE DAILY IN THE EVENING. 90 tablet 1   Multiple Vitamin (MULTIVITAMIN WITH MINERALS) TABS Take 1 tablet by mouth daily.     nitroGLYCERIN (NITROSTAT) 0.4 MG SL tablet Place 1 tablet (0.4 mg total) under the tongue every 5 (five) minutes as needed for chest pain. 25 tablet 3   nystatin (MYCOSTATIN/NYSTOP) powder Apply topically 3 (three) times daily. Use as needed for yeast 60 g 3   potassium chloride (K-DUR) 10 MEQ tablet TAKE 4 TABLETS TWICE DAILY. (Patient taking differently: Take 40 mEq by mouth 2 (two) times daily. ) 240  tablet 8   Respiratory Therapy Supplies (FLUTTER) DEVI Use as directed 1 each 0   SYNTHROID 75 MCG tablet TAKE 1 TABLET BEFORE BREAKFAST. 90 tablet 1   triamcinolone (NASACORT) 55 MCG/ACT nasal inhaler Place 1-2 sprays into the nose at bedtime.      triamcinolone lotion (KENALOG) 0.1 %      rivaroxaban (XARELTO) 20 MG TABS tablet Take 1 tablet (20 mg total) by mouth daily with supper. 90 tablet 3   No current facility-administered medications for this visit.     Allergies  Allergen Reactions   Chlorhexidine Anaphylaxis and Itching    Sensitive to dye in Betadine & Chlorohexadine    Enoxaparin Sodium Anaphylaxis   Heparin Anaphylaxis   Hornet Venom Anaphylaxis    European Hornets   Pork-Derived Products Anaphylaxis   Povidone-Iodine Itching and Rash    NOTE: Patient has had anaphylactic reaction to Betadine due to dyes in product. Sensitivity-" but if its wiped off she is able to tolerate betadine"    Indomethacin Rash   Lactose Intolerance (Gi) Diarrhea and Nausea And Vomiting    Can tolerate most [place] cheeses (Takes Lactaid)   Phenylpropanolamine Hypertension    Social History   Socioeconomic History   Marital status: Married    Spouse name: Not on file   Number of children: Not on file   Years of education: Not on file   Highest education level: Not on file  Occupational History   Not on  file  Social Needs   Financial resource strain: Not on file   Food insecurity    Worry: Not on file    Inability: Not on file   Transportation needs    Medical: Not on file    Non-medical: Not on file  Tobacco Use   Smoking status: Never Smoker   Smokeless tobacco: Never Used   Tobacco comment: no smoking   Substance and Sexual Activity   Alcohol use: No   Drug use: No   Sexual activity: Not Currently  Lifestyle   Physical activity    Days per week: Not on file    Minutes per session: Not on file   Stress: Not on file  Relationships   Social connections    Talks on phone: Not on file    Gets together: Not on file    Attends religious service: Not on file    Active member of club or organization: Not on file    Attends meetings of clubs or organizations: Not on file    Relationship status: Not on file   Intimate partner violence    Fear of current or ex partner: Not on file    Emotionally abused: Not on file    Physically abused: Not on file    Forced sexual activity: Not on file  Other Topics Concern   Not on file  Social History Narrative   Lives in [place] with husband; has had miscarriages but no children.    Disabled but exercises nearly every day (can only exericse in water - aerobics)   Takes opioids for pain relief; takes no herbal medications; has a heart healthy diet.     Family History  Problem Relation Age of Onset   Heart disease Father        MI    Heart disease Sister     Review of Systems:  As stated in the HPI and otherwise negative.   BP (!) 144/86    Pulse 68  Ht 5\' 2"  (1.575 m)    Wt 199 lb 6.4 oz (90.4 kg)    SpO2 97%    BMI 36.47 kg/m   Physical Examination:  General: Well developed, well nourished, NAD  HEENT: OP clear, mucus membranes moist  SKIN: warm, dry. No rashes. Neuro: No focal deficits  Musculoskeletal: Muscle strength 5/5 all ext  Psychiatric: Mood and affect normal  Neck: No JVD, no carotid bruits, no  thyromegaly, no lymphadenopathy.  Lungs:Clear bilaterally, no wheezes, rhonci, crackles Cardiovascular: Regular rate and rhythm. No murmurs, gallops or rubs. Abdomen:Soft. Bowel sounds present. Non-tender.  Extremities: No lower extremity edema. Pulses are 2 + in the bilateral DP/PT.  Echo December 2018:  - Left ventricle: The cavity size was normal. Wall thickness was   normal. Systolic function was normal. The estimated ejection   fraction was in the range of 50% to 55%. Wall motion was normal;   there were no regional wall motion abnormalities. Features are   consistent with a pseudonormal left ventricular filling pattern,   with concomitant abnormal relaxation and increased filling   pressure (grade 2 diastolic dysfunction).  Cardiac cath 09/22/02: 1. Ventriculography was performed in the RAO projection. Overall ejection  fraction was 70%. No segmental wall motion abnormalities were  identified.  2. The left main coronary artery has, what appears to be, about 30%  narrowing at the ostium. In most views there does appear to be a patent  ostium; however, some tapered narrowing cannot be excluded.  3. The LAD proper has about 40-50% narrowing, at most, in the mid portion  beyond the diagonal takeoff. The vessel is calcified. The diagonal  takeoff itself has a long 80% stenosis at the ostium extending out into  the mid portion of the diagonal. The distal diagonal does appear to be  suitable for grafting.  4. The large circumflex has about 70-80% focal narrowing prior to the  bifurcation of the marginal.  5. The right coronary artery has some mild luminal irregularities but no  high-grade stenoses.  IMPRESSION:  1. Coronary artery disease with significant involvement and circumflex  involvement.  2. Multiple medical issues, as described above.  EKG:  EKG is not ordered today. The ekg ordered today demonstrates   Recent Labs: 12/05/2018: Brain Natriuretic Peptide 36 04/08/2019:  ALT 24; BUN 20; Creatinine, Ser 0.74; Hemoglobin 14.0; Platelets 243.0; Potassium 4.6; Sodium 137; TSH 1.49   Lipid Panel Lipid Panel     Component Value Date/Time   CHOL 153 04/08/2019 1412   TRIG 226.0 (H) 04/08/2019 1412   HDL 40.20 04/08/2019 1412   CHOLHDL 4 04/08/2019 1412   VLDL 45.2 (H) 04/08/2019 1412   LDLDIRECT 77.0 04/08/2019 1412     Wt Readings from Last 3 Encounters:  04/23/19 199 lb 6.4 oz (90.4 kg)  03/26/19 200 lb 6.4 oz (90.9 kg)  12/04/18 200 lb (90.7 kg)     Other studies Reviewed: Additional studies/ records that were reviewed today include: . Review of the above records demonstrates:    Assessment and Plan:   1. CAD without angina: She is known to have moderate CAD by cath in 2003. LV function normal by echo in December 2018. She has no chest pain. Will continue statin and beta blocker. She will stop ASA due to easy bruising (also on Xarelto).   2. Atrial fib, paroxysmal: Sinus today. Will continue beta blocker and Xarelto.    3. HYPERTENSION: BP is controlled.   4. CAROTID ARTERY DISEASE: Mild bilateral  carotid disease by dopplers June 2019  5. Hyperlipidemia: Lipids followed in primary care per pt. Continue statin.   6. Chronic diastolic CHF: Weight is stable. Continue Lasix. She will use extra lasix as needed.   Current medicines are reviewed at length with the patient today.  The patient does not have concerns regarding medicines.  The following changes have been made:  no change  Labs/ tests ordered today include:   No orders of the defined types were placed in this encounter.  Disposition:   FU with me in 6 months  Signed, Lauree Chandler, MD 04/23/2019 12:34 PM    [facility] [place], [place], [place]  [address] Phone: [phone]; Fax: [phone]

## 2019-05-15 ENCOUNTER — Other Ambulatory Visit: Payer: Self-pay | Admitting: Family Medicine

## 2019-05-26 ENCOUNTER — Telehealth: Payer: Self-pay | Admitting: Family Medicine

## 2019-05-26 ENCOUNTER — Encounter: Payer: Self-pay | Admitting: Family Medicine

## 2019-05-26 NOTE — Telephone Encounter (Signed)
-----   Message from Willy Eddy, RN sent at 05/26/2019  3:57 PM EDT ----- Regarding: Epic error Good afternoon Shaarav Ripple   Do to an issue with Epic the refill order did not go through or was auto cancelled.  We are working with Epic and hope to have this issue resolved by 06/11/19.  We apologize for any inconvenience this may cause.  Please place a new order for the medication.  05/15/2019 CLARITIN-D 12 HOUR 5-120 MG PO TB12   Thank you,  Zebedee Iba, RN, BSN Ambulatory Analyst

## 2019-06-12 ENCOUNTER — Other Ambulatory Visit: Payer: Self-pay | Admitting: Family Medicine

## 2019-06-12 DIAGNOSIS — E039 Hypothyroidism, unspecified: Secondary | ICD-10-CM

## 2019-06-15 DIAGNOSIS — Z79891 Long term (current) use of opiate analgesic: Secondary | ICD-10-CM | POA: Diagnosis not present

## 2019-06-15 DIAGNOSIS — M4722 Other spondylosis with radiculopathy, cervical region: Secondary | ICD-10-CM | POA: Diagnosis not present

## 2019-06-15 DIAGNOSIS — M963 Postlaminectomy kyphosis: Secondary | ICD-10-CM | POA: Diagnosis not present

## 2019-06-15 DIAGNOSIS — G894 Chronic pain syndrome: Secondary | ICD-10-CM | POA: Diagnosis not present

## 2019-06-18 NOTE — Progress Notes (Addendum)
Virtual Visit via Video Note  I connected with patient on 06/22/19 at  3:45 PM EDT by audio enabled telemedicine application and verified that I am speaking with the correct person using two identifiers.   THIS ENCOUNTER IS A VIRTUAL VISIT DUE TO COVID-19 - PATIENT WAS NOT SEEN IN THE OFFICE. PATIENT HAS CONSENTED TO VIRTUAL VISIT / TELEMEDICINE VISIT   Location of patient: home  Location of provider: office  I discussed the limitations of evaluation and management by telemedicine and the availability of in person appointments. The patient expressed understanding and agreed to proceed.   Subjective:   Latoya [name] is a 73 y.o. female who presents for Medicare Annual (Subsequent) preventive examination.  Review of Systems:    Home Safety/Smoke Alarms: Feels safe in home. Smoke alarms in place.   Lives with husband in 1 story home. Grab bar and bench in shower. Uses cane or walker in the house.  Female:        Mammo- 08/15/18       Dexa scan-08/15/18        CCS- last 2012 w/ 5 yr recall     Objective:     Vitals: Unable to assess. This visit is enabled though telemedicine due to Covid 19.  Advanced Directives 06/22/2019 12/04/2018 06/16/2018 01/28/2017 09/26/2016 08/03/2016 06/03/2016  Does Patient Have a Medical Advance Directive? Yes No Yes Yes No No Yes  Type of Paramedic of [place];Living will - Living will;Healthcare Power of [place];Living will - - Living will;Healthcare Power of Attorney  Does patient want to make changes to medical advance directive? No - Patient declined - No - Patient declined Yes (MAU/Ambulatory/Procedural Areas - Information given) - - No - Patient declined  Copy of [place] in Chart? Yes - validated most recent copy scanned in chart (See row information) - No - copy requested No - copy requested - - No - copy requested  Would patient like information on creating a medical advance  directive? - Yes (ED - Information included in AVS) - - No - Patient declined - -  Pre-existing out of facility DNR order (yellow form or pink MOST form) - - - - - - -    Tobacco Social History   Tobacco Use  Smoking Status Never Smoker  Smokeless Tobacco Never Used  Tobacco Comment   no smoking      Counseling given: Not Answered Comment: no smoking    Clinical Intake:     Pain : No/denies pain   Past Medical History:  Diagnosis Date  . Anemia, iron deficiency   . Antiphospholipid syndrome (HCC)    with hypercoagulable state  . Asthma    extrinsic; moderate, persistant, nml spirometry 2010, nml CXR 1/08  . Bladder troubles    REPORTS INFECTIONS ON OCCASION DUE TO URETHRA MEATUS STRICTURE AT BIRTH   . CAD (coronary artery disease)    a. 50% mid LAD, 80% ostial D1 and moderate 80% mid circ by cath 2003. b. nonischemic nuc in 2013.  Marland Kitchen Cancer ([place])    basal cell removed fr. L arm   . Carotid arterial disease ([place])    a. 03/2017 showed 1-39% bilateral internal carotid stenosis (high end range on left), >50% LECA stenosis, with recommendation for f/u duplex 03/2018.  Marland Kitchen Complication of anesthesia    cardiac arrest- in OR, at age 82 (87)y.o. during Scalenotomy in her early 20's  . Drug allergy  heparin/lovenox  . DVT (deep venous thrombosis) ([place])   . Erosive gastritis 1994  . Family history of adverse reaction to anesthesia    some family members have had trouble waking up  . GERD (gastroesophageal reflux disease)   . H/O hiatal hernia   . HLD (hyperlipidemia)   . HTN (hypertension)    Pt states her high blood pressure readings are due tot he method of [facility] at [facility], manages pt.  LOV 03/2014  . Hypothyroidism   . Liver spot    cyst- - no biopsy, but told that its benign   . Moderate mitral regurgitation   . PAF (paroxysmal atrial fibrillation) ([place])   . Pulmonary embolism ([place])    related to back surgery with prior coumadin use, now off  .  PVC's (premature ventricular contractions)    a. noted in 09/2017.  Marland Kitchen Spondylosis, lumbosacral    ARTHRITIS- OA    Past Surgical History:  Procedure Laterality Date  . ABDOMINAL HYSTERECTOMY     partial abdominal -1998  . BACK SURGERY     x13 cervical and lumbar thoracic spine surgery  . BREAST ENHANCEMENT SURGERY    . BREAST SURGERY     all benign cysts x3   . CARDIAC CATHETERIZATION     2005  . CHOLECYSTECTOMY N/A 10/04/2016   Procedure: LAPAROSCOPIC CHOLECYSTECTOMY;  Surgeon: Erroll Luna, MD;  Location: [facility] OR;  Service: General;  Laterality: N/A;  . cleft lip and palate repair     73 yo   . hysterectomy    . IR GENERIC HISTORICAL  06/04/2016   IR PERC CHOLECYSTOSTOMY 06/04/2016 Greggory Keen, MD [facility]  . IR GENERIC HISTORICAL  08/06/2016   IR CHOLANGIOGRAM EXISTING TUBE 08/06/2016 Aletta Edouard, MD [facility]  . IR GENERIC HISTORICAL  07/17/2016   IR RADIOLOGIST EVAL & MGMT 07/17/2016 [facility]  . JOINT REPLACEMENT    . POSTERIOR CERVICAL FUSION/FORAMINOTOMY Right 08/31/2014   Procedure: Right cervical six-seven foraminotomy, Cervical five-Thoracic one fusion, Cervical six-seven lateral mass screws;  Surgeon: Erline Levine, MD;  Location: [facility];  Service: Neurosurgery;  Laterality: Right;  . spina bifida repair     73 yo   . TONSILLECTOMY    . TOTAL KNEE ARTHROPLASTY     left   Family History  Problem Relation Age of Onset  . Heart disease Father        MI   . Heart disease Sister    Social History   Socioeconomic History  . Marital status: Married    Spouse name: Not on file  . Number of children: Not on file  . Years of education: Not on file  . Highest education level: Not on file  Occupational History  . Not on file  Social Needs  . Financial resource strain: Not on file  . Food insecurity    Worry: Not on file    Inability: Not on file  . Transportation needs    Medical: Not on file    Non-medical: Not on file  Tobacco Use  .  Smoking status: Never Smoker  . Smokeless tobacco: Never Used  . Tobacco comment: no smoking   Substance and Sexual Activity  . Alcohol use: No  . Drug use: No  . Sexual activity: Not Currently  Lifestyle  . Physical activity    Days per week: Not on file    Minutes per session: Not on file  . Stress: Not on file  Relationships  .  Social Herbalist on phone: Not on file    Gets together: Not on file    Attends religious service: Not on file    Active member of club or organization: Not on file    Attends meetings of clubs or organizations: Not on file    Relationship status: Not on file  Other Topics Concern  . Not on file  Social History Narrative   Lives in [place] with husband; has had miscarriages but no children.    Disabled but exercises nearly every day (can only exericse in water - aerobics)   Takes opioids for pain relief; takes no herbal medications; has a heart healthy diet.     Outpatient Encounter Medications as of 06/22/2019  Medication Sig  . albuterol (PROVENTIL HFA;VENTOLIN HFA) 108 (90 Base) MCG/ACT inhaler Inhale 2 puffs into the lungs every 6 (six) hours as needed for wheezing or shortness of breath.  Marland Kitchen atorvastatin (LIPITOR) 40 MG tablet TAKE 1 TABLET ONCE DAILY. (Patient taking differently: Take 40 mg by mouth daily. )  . azelastine (ASTELIN) 137 MCG/SPRAY nasal spray Place 1 spray into the nose 2 (two) times daily.   . beclomethasone (QVAR REDIHALER) 80 MCG/ACT inhaler Inhale 2 puffs into the lungs 3 (three) times daily.  . Calcium Carb-Cholecalciferol (CALCIUM 500 +D) 500-400 MG-UNIT TABS Take 1 tablet by mouth 2 (two) times daily.  . cyclobenzaprine (FLEXERIL) 10 MG tablet Take 10 mg by mouth 3 (three) times daily as needed for muscle spasms.  Marland Kitchen docusate sodium (COLACE) 100 MG capsule Take 200 mg by mouth 2 (two) times daily.   Marland Kitchen EPINEPHrine 0.3 mg/0.3 mL IJ SOAJ injection Inject 0.3 mg into the muscle once. As needed for anaphylaxis  . furosemide  (LASIX) 40 MG tablet Take 1 tablet (40 mg total) by mouth daily.  Marland Kitchen gabapentin (NEURONTIN) 600 MG tablet Take 600-750 mg by mouth at bedtime.   . Glucosamine-Chondroitin 750-600 MG TABS Take 2 tablets by mouth 2 (two) times daily.   Marland Kitchen HYDROmorphone (DILAUDID) 4 MG tablet Take 4 mg by mouth daily.   . Loratadine-Pseudoephedrine (CLARITIN-D 12 HOUR PO) Take 1 tablet by mouth 2 (two) times daily.   . Melatonin 5 MG TABS Take 2.5-5 mg by mouth at bedtime as needed.  . metoprolol tartrate (LOPRESSOR) 100 MG tablet Take 1 tablet (100 mg total) by mouth 2 (two) times daily.  . montelukast (SINGULAIR) 10 MG tablet TAKE 1 TABLET ONCE DAILY IN THE EVENING.  . Multiple Vitamin (MULTIVITAMIN WITH MINERALS) TABS Take 1 tablet by mouth daily.  . nitroGLYCERIN (NITROSTAT) 0.4 MG SL tablet Place 1 tablet (0.4 mg total) under the tongue every 5 (five) minutes as needed for chest pain.  Marland Kitchen nystatin (MYCOSTATIN/NYSTOP) powder Apply topically 3 (three) times daily. Use as needed for yeast  . potassium chloride (K-DUR) 10 MEQ tablet TAKE 4 TABLETS TWICE DAILY. (Patient taking differently: Take 40 mEq by mouth 2 (two) times daily. )  . Respiratory Therapy Supplies (FLUTTER) DEVI Use as directed  . rivaroxaban (XARELTO) 20 MG TABS tablet Take 1 tablet (20 mg total) by mouth daily with supper.  Marland Kitchen SYNTHROID 75 MCG tablet TAKE 1 TABLET BEFORE BREAKFAST.  Marland Kitchen triamcinolone (NASACORT) 55 MCG/ACT nasal inhaler Place 1-2 sprays into the nose at bedtime.   . triamcinolone lotion (KENALOG) 0.1 %    No facility-administered encounter medications on file as of 06/22/2019.     Activities of Daily Living In your present state of health, do you  have any difficulty performing the following activities: 06/22/2019  Hearing? N  Vision? N  Difficulty concentrating or making decisions? N  Walking or climbing stairs? N  Dressing or bathing? N  Doing errands, shopping? N  Preparing Food and eating ? N  Using the Toilet? N  In the past  six months, have you accidently leaked urine? N  Do you have problems with loss of bowel control? N  Managing your Medications? N  Managing your Finances? N  Housekeeping or managing your Housekeeping? N  Some recent data might be hidden    Patient Care Team: Copland, Gay Filler, MD as PCP - General (Family Medicine) Burnell Blanks, MD as PCP - Cardiology (Cardiology) Nicholaus Bloom, MD as Consulting Physician (Anesthesiology) Erline Levine, MD as Consulting Physician (Neurosurgery) Mosetta Anis, MD as Consulting Physician (Allergy) Tanda Rockers, MD as Consulting Physician (Pulmonary Disease) Burnell Blanks, MD as Consulting Physician (Cardiology) Macarthur Critchley, [place] as Consulting Physician (Optometry) Druscilla Brownie, MD as Consulting Physician (Dermatology) Dionne Milo, Rockney Ghee, MD as Consulting Physician (Hematology and Oncology)    Assessment:   This is a routine wellness examination for [place]. Physical assessment deferred to PCP.  Exercise Activities and Dietary recommendations Current Exercise Habits: Home exercise routine, Type of exercise: walking, Time (Minutes): 30, Frequency (Times/Week): 7, Weekly Exercise (Minutes/Week): 210, Exercise limited by: None identified Diet (meal preparation, eat out, water intake, caffeinated beverages, dairy products, fruits and vegetables): in general, a "healthy" diet  , well balanced    Goals    . Increase physical activity     Return to water aerobics tolerated.    . Weight (lb) < 200 lb (90.7 kg)     Goal weight 175lb       Fall Risk Fall Risk  06/22/2019 06/16/2018 01/28/2017  Falls in the past year? 0 No No     Depression Screen PHQ 2/9 Scores 06/22/2019 06/16/2018 04/16/2018 01/28/2017  PHQ - 2 Score 0 0 0 0     Cognitive Function Ad8 score reviewed for issues:  Issues making decisions:no  Less interest in hobbies / activities:no  Repeats questions, stories (family complaining):no  Trouble using  ordinary gadgets (microwave, computer, phone):no  Forgets the month or year: no  Mismanaging finances: no  Remembering appts:no  Daily problems with thinking and/or memory:no Ad8 score is=0     MMSE - Mini Mental State Exam 06/16/2018 01/28/2017  Orientation to time 5 5  Orientation to Place 5 5  Registration 3 3  Attention/ Calculation 5 5  Recall 3 3  Language- name 2 objects 2 2  Language- repeat 1 1  Language- follow 3 step command 3 3  Language- read & follow direction 1 1  Write a sentence 1 1  Copy design 1 1  Total score 30 30        Immunization History  Administered Date(s) Administered  . Influenza Split 07/22/2016  . Influenza-Unspecified 07/22/2013, 06/22/2014, 08/22/2017, 07/17/2018  . Pneumococcal Conjugate-13 08/09/2014  . Pneumococcal-Unspecified 07/22/2013  . Tdap 01/28/2017    Screening Tests Health Maintenance  Topic Date Due  . FOOT EXAM  02/16/1956  . OPHTHALMOLOGY EXAM  02/16/1956  . URINE MICROALBUMIN  02/16/1956  . INFLUENZA VACCINE  05/23/2019  . HEMOGLOBIN A1C  10/08/2019  . MAMMOGRAM  08/15/2020  . COLONOSCOPY  01/20/2021  . TETANUS/TDAP  01/29/2027  . DEXA SCAN  Completed  . Hepatitis C Screening  Completed  . PNA vac Low Risk Adult  Completed  Plan:   See you next year!  Continue to eat heart healthy diet (full of fruits, vegetables, whole grains, lean protein, water--limit salt, fat, and sugar intake) and increase physical activity as tolerated.  Continue doing brain stimulating activities (puzzles, reading, adult coloring books, staying active) to keep memory sharp.     I have personally reviewed and noted the following in the patient's chart:   . Medical and social history . Use of alcohol, tobacco or illicit drugs  . Current medications and supplements . Functional ability and status . Nutritional status . Physical activity . Advanced directives . List of other physicians . Hospitalizations, surgeries, and  ER visits in previous 12 months . Vitals . Screenings to include cognitive, depression, and falls . Referrals and appointments  In addition, I have reviewed and discussed with patient certain preventive protocols, quality metrics, and best practice recommendations. A written personalized care plan for preventive services as well as general preventive health recommendations were provided to patient.     Shela Nevin, RN  06/22/2019   I have read the above [place] note by Ms. Vevelyn Royals, and agree with her documentation Denny Peon MD

## 2019-06-19 ENCOUNTER — Ambulatory Visit: Payer: Medicare Other | Admitting: *Deleted

## 2019-06-22 ENCOUNTER — Encounter: Payer: Self-pay | Admitting: *Deleted

## 2019-06-22 ENCOUNTER — Other Ambulatory Visit: Payer: Self-pay

## 2019-06-22 ENCOUNTER — Ambulatory Visit (INDEPENDENT_AMBULATORY_CARE_PROVIDER_SITE_OTHER): Payer: Medicare Other | Admitting: *Deleted

## 2019-06-22 DIAGNOSIS — Z0000 Encounter for general adult medical examination without abnormal findings: Secondary | ICD-10-CM | POA: Diagnosis not present

## 2019-06-22 NOTE — Patient Instructions (Signed)
See you next year!  Continue to eat heart healthy diet (full of fruits, vegetables, whole grains, lean protein, water--limit salt, fat, and sugar intake) and increase physical activity as tolerated.  Continue doing brain stimulating activities (puzzles, reading, adult coloring books, staying active) to keep memory sharp.    Latoya [name] , Thank you for taking time to come for your Medicare Wellness Visit. I appreciate your ongoing commitment to your health goals. Please review the following plan we discussed and let me know if I can assist you in the future.   These are the goals we discussed: Goals    . Increase physical activity     Return to water aerobics tolerated.    . Weight (lb) < 200 lb (90.7 kg)     Goal weight 175lb       This is a list of the screening recommended for you and due dates:  Health Maintenance  Topic Date Due  . Complete foot exam   02/16/1956  . Eye exam for diabetics  02/16/1956  . Urine Protein Check  02/16/1956  . Flu Shot  05/23/2019  . Hemoglobin A1C  10/08/2019  . Mammogram  08/15/2020  . Colon Cancer Screening  01/20/2021  . Tetanus Vaccine  01/29/2027  . DEXA scan (bone density measurement)  Completed  .  Hepatitis C: One time screening is recommended by Center for Disease Control  (CDC) for  adults born from [birth year] through 1964.   Completed  . Pneumonia vaccines  Completed    Health Maintenance After Age 21 After age 5, you are at a higher risk for certain long-term diseases and infections as well as injuries from falls. Falls are a major cause of broken bones and head injuries in people who are older than age 62. Getting regular preventive care can help to keep you healthy and well. Preventive care includes getting regular testing and making lifestyle changes as recommended by your health care provider. Talk with your health care provider about:  Which screenings and tests you should have. A screening is a test that checks for a disease when  you have no symptoms.  A diet and exercise plan that is right for you. What should I know about screenings and tests to prevent falls? Screening and testing are the best ways to find a health problem early. Early diagnosis and treatment give you the best chance of managing medical conditions that are common after age 67. Certain conditions and lifestyle choices may make you more likely to have a fall. Your health care provider may recommend:  Regular vision checks. Poor vision and conditions such as cataracts can make you more likely to have a fall. If you wear glasses, make sure to get your prescription updated if your vision changes.  Medicine review. Work with your health care provider to regularly review all of the medicines you are taking, including over-the-counter medicines. Ask your health care provider about any side effects that may make you more likely to have a fall. Tell your health care provider if any medicines that you take make you feel dizzy or sleepy.  Osteoporosis screening. Osteoporosis is a condition that causes the bones to get weaker. This can make the bones weak and cause them to break more easily.  Blood pressure screening. Blood pressure changes and medicines to control blood pressure can make you feel dizzy.  Strength and balance checks. Your health care provider may recommend certain tests to check your strength and balance while standing,  walking, or changing positions.  Foot health exam. Foot pain and numbness, as well as not wearing proper footwear, can make you more likely to have a fall.  Depression screening. You may be more likely to have a fall if you have a fear of falling, feel emotionally low, or feel unable to do activities that you used to do.  Alcohol use screening. Using too much alcohol can affect your balance and may make you more likely to have a fall. What actions can I take to lower my risk of falls? General instructions  Talk with your health  care provider about your risks for falling. Tell your health care provider if: ? You fall. Be sure to tell your health care provider about all falls, even ones that seem minor. ? You feel dizzy, sleepy, or off-balance.  Take over-the-counter and prescription medicines only as told by your health care provider. These include any supplements.  Eat a healthy diet and maintain a healthy weight. A healthy diet includes low-fat dairy products, low-fat (lean) meats, and fiber from whole grains, beans, and lots of fruits and vegetables. Home safety  Remove any tripping hazards, such as rugs, cords, and clutter.  Install safety equipment such as grab bars in bathrooms and safety rails on stairs.  Keep rooms and walkways well-lit. Activity   Follow a regular exercise program to stay fit. This will help you maintain your balance. Ask your health care provider what types of exercise are appropriate for you.  If you need a cane or walker, use it as recommended by your health care provider.  Wear supportive shoes that have nonskid soles. Lifestyle  Do not drink alcohol if your health care provider tells you not to drink.  If you drink alcohol, limit how much you have: ? 0-1 drink a day for women. ? 0-2 drinks a day for men.  Be aware of how much alcohol is in your drink. In the [place] one drink equals one typical bottle of beer (12 oz), one-half glass of wine (5 oz), or one shot of hard liquor (1 oz).  Do not use any products that contain nicotine or tobacco, such as cigarettes and e-cigarettes. If you need help quitting, ask your health care provider. Summary  Having a healthy lifestyle and getting preventive care can help to protect your health and wellness after age 34.  Screening and testing are the best way to find a health problem early and help you avoid having a fall. Early diagnosis and treatment give you the best chance for managing medical conditions that are more common for people  who are older than age 67.  Falls are a major cause of broken bones and head injuries in people who are older than age 23. Take precautions to prevent a fall at home.  Work with your health care provider to learn what changes you can make to improve your health and wellness and to prevent falls. This information is not intended to replace advice given to you by your health care provider. Make sure you discuss any questions you have with your health care provider. Document Released: 08/21/2017 Document Revised: 01/29/2019 Document Reviewed: 08/21/2017 Elsevier Patient Education  2020 [employer].

## 2019-07-07 DIAGNOSIS — Z23 Encounter for immunization: Secondary | ICD-10-CM | POA: Diagnosis not present

## 2019-07-09 ENCOUNTER — Other Ambulatory Visit: Payer: Self-pay | Admitting: Physician Assistant

## 2019-07-27 DIAGNOSIS — M963 Postlaminectomy kyphosis: Secondary | ICD-10-CM | POA: Diagnosis not present

## 2019-07-27 DIAGNOSIS — G894 Chronic pain syndrome: Secondary | ICD-10-CM | POA: Diagnosis not present

## 2019-07-27 DIAGNOSIS — Z79891 Long term (current) use of opiate analgesic: Secondary | ICD-10-CM | POA: Diagnosis not present

## 2019-07-27 DIAGNOSIS — M4722 Other spondylosis with radiculopathy, cervical region: Secondary | ICD-10-CM | POA: Diagnosis not present

## 2019-08-13 ENCOUNTER — Encounter: Payer: Self-pay | Admitting: Family Medicine

## 2019-08-13 DIAGNOSIS — H2513 Age-related nuclear cataract, bilateral: Secondary | ICD-10-CM | POA: Diagnosis not present

## 2019-08-13 LAB — HM DIABETES EYE EXAM

## 2019-08-25 DIAGNOSIS — Z79891 Long term (current) use of opiate analgesic: Secondary | ICD-10-CM | POA: Diagnosis not present

## 2019-08-25 DIAGNOSIS — M4722 Other spondylosis with radiculopathy, cervical region: Secondary | ICD-10-CM | POA: Diagnosis not present

## 2019-08-25 DIAGNOSIS — G894 Chronic pain syndrome: Secondary | ICD-10-CM | POA: Diagnosis not present

## 2019-08-25 DIAGNOSIS — M963 Postlaminectomy kyphosis: Secondary | ICD-10-CM | POA: Diagnosis not present

## 2019-08-28 ENCOUNTER — Other Ambulatory Visit: Payer: Self-pay | Admitting: Cardiovascular Disease

## 2019-09-11 ENCOUNTER — Encounter: Payer: Self-pay | Admitting: Family Medicine

## 2019-09-11 DIAGNOSIS — Z1231 Encounter for screening mammogram for malignant neoplasm of breast: Secondary | ICD-10-CM | POA: Diagnosis not present

## 2019-09-18 ENCOUNTER — Other Ambulatory Visit: Payer: Self-pay | Admitting: Physician Assistant

## 2019-09-24 ENCOUNTER — Other Ambulatory Visit: Payer: Self-pay | Admitting: Family Medicine

## 2019-10-07 ENCOUNTER — Other Ambulatory Visit: Payer: Self-pay

## 2019-10-07 NOTE — Progress Notes (Signed)
[facility] [address], [place], [place] [address] [phone]  Date:  10/08/2019   Name:  DANAPAOLA FURBEE   DOB:  01/05/1946   MRN:  [id number]  PCP:  Darreld Mclean, MD    Chief Complaint: Atrial Fibrillation, Hypertension, Hypothyroidism, and Coronary Artery Disease   History of Present Illness:  Latoya [name] is a 73 y.o. very pleasant female patient who presents with the following:  Here today for follow-up visit and labs History of A. fib, DVT and PE due to coagulation disorder, hypertension, CAD, hypothyroidism, diabetes  Last seen by myself about 6 months ago She is followed by pain management for her chronic back issues She notes that her back is ok, but her knee is giving her more trouble Her pain doctor is also a rheumatologist per her report- he is also helping with her knee issue They had talked about a cortisone injection, trying to avoid due to her diabetes She is s/p left knee replacement, her right knee is now the problem She is trying to walk for exercise   She is married, I also see her husband [name]  Needs an A1c today Flu vaccine: [facility] Urine microalbumin: Due today Foot exam is due  BP Readings from Last 3 Encounters:  10/08/19 140/90  04/23/19 (!) 144/86  03/26/19 128/80   Overall Zigmund Daniel is feeling well, has been in good spirits.  She is eager to get a Covid vaccine when possible  Lab Results  Component Value Date   HGBA1C 6.8 (H) 04/08/2019   Lab Results  Component Value Date   TSH 1.49 04/08/2019     Patient Active Problem List   Diagnosis Date Noted  . Spondylosis, lumbosacral   . Pulmonary embolism ([place])   . PAF (paroxysmal atrial fibrillation) ([place])   . Moderate mitral regurgitation   . H/O hiatal hernia   . DVT (deep venous thrombosis) ([place])   . Complication of anesthesia   . Carotid arterial disease ([place])   . Cancer ([place])   . Antiphospholipid syndrome  ([place])   . Anemia, iron deficiency   . Diabetes mellitus without complication ([place]) [id number]  . Leukocytosis 08/04/2016  . Hypothyroidism 08/04/2016  . Anemia of chronic disease 08/04/2016  . Gastroparesis   . Enterococcal bacteremia 06/06/2016  . Bacteremia due to Klebsiella pneumoniae 06/06/2016  . Bacteremia due to Escherichia coli 06/06/2016  . Ascending cholangitis 06/04/2016  . Hemangioma of liver 06/04/2016  . Cervical pseudoarthrosis ([place]) 08/31/2014  . Chest pain, atypical 04/11/2013  . PAD (peripheral artery disease) ([place]) 06/20/2011  . BACK PAIN, CHRONIC 06/01/2010  . CAD in native artery 11/15/2009  . MURMUR 11/15/2009  . CAROTID BRUIT, LEFT 11/15/2009  . Essential hypertension 03/18/2009  . Asthma 03/18/2009  . GERD 03/18/2009  . Hyperlipidemia 11/29/2008  . Erosive gastritis 10/22/1992    Past Medical History:  Diagnosis Date  . Anemia, iron deficiency   . Antiphospholipid syndrome (HCC)    with hypercoagulable state  . Asthma    extrinsic; moderate, persistant, nml spirometry 2010, nml CXR 1/08  . Bladder troubles    REPORTS INFECTIONS ON OCCASION DUE TO URETHRA MEATUS STRICTURE AT BIRTH   . CAD (coronary artery disease)    a. 50% mid LAD, 80% ostial D1 and moderate 80% mid circ by cath 2003. b. nonischemic nuc in 2013.  Marland Kitchen Cancer ([place])    basal cell removed fr. L arm   .  Carotid arterial disease ([place])    a. 03/2017 showed 1-39% bilateral internal carotid stenosis (high end range on left), >50% LECA stenosis, with recommendation for f/u duplex 03/2018.  Marland Kitchen Complication of anesthesia    cardiac arrest- in OR, at age 18 (30)y.o. during Scalenotomy in her early 20's  . Drug allergy    heparin/lovenox  . DVT (deep venous thrombosis) ([place])   . Erosive gastritis 1994  . Family history of adverse reaction to anesthesia    some family members have had trouble waking up  . GERD (gastroesophageal reflux disease)   . H/O hiatal hernia   . HLD (hyperlipidemia)    . HTN (hypertension)    Pt states her high blood pressure readings are due tot he method of measurements[facility]aney at [facility], manages pt.  LOV 03/2014  . Hypothyroidism   . Liver spot    cyst- - no biopsy, but told that its benign   . Moderate mitral regurgitation   . PAF (paroxysmal atrial fibrillation) ([place])   . Pulmonary embolism ([place])    related to back surgery with prior coumadin use, now off  . PVC's (premature ventricular contractions)    a. noted in 09/2017.  Marland Kitchen Spondylosis, lumbosacral    ARTHRITIS- OA     Past Surgical History:  Procedure Laterality Date  . ABDOMINAL HYSTERECTOMY     partial abdominal -1998  . BACK SURGERY     x13 cervical and lumbar thoracic spine surgery  . BREAST ENHANCEMENT SURGERY    . BREAST SURGERY     all benign cysts x3   . CARDIAC CATHETERIZATION     2005  . CHOLECYSTECTOMY N/A 10/04/2016   Procedure: LAPAROSCOPIC CHOLECYSTECTOMY;  Surgeon: Erroll Luna, MD;  Location: [facility] OR;  Service: General;  Laterality: N/A;  . cleft lip and palate repair     73 yo   . hysterectomy    . IR GENERIC HISTORICAL  06/04/2016   IR PERC CHOLECYSTOSTOMY 06/04/2016 Greggory Keen, MD [facility]  . IR GENERIC HISTORICAL  08/06/2016   IR CHOLANGIOGRAM EXISTING TUBE 08/06/2016 Aletta Edouard, MD [facility]  . IR GENERIC HISTORICAL  07/17/2016   IR RADIOLOGIST EVAL & MGMT 07/17/2016 [facility]  . JOINT REPLACEMENT    . POSTERIOR CERVICAL FUSION/FORAMINOTOMY Right 08/31/2014   Procedure: Right cervical six-seven foraminotomy, Cervical five-Thoracic one fusion, Cervical six-seven lateral mass screws;  Surgeon: Erline Levine, MD;  Location: [facility];  Service: Neurosurgery;  Laterality: Right;  . spina bifida repair     73 yo   . TONSILLECTOMY    . TOTAL KNEE ARTHROPLASTY     left    Social History   Tobacco Use  . Smoking status: Never Smoker  . Smokeless tobacco: Never Used  . Tobacco comment: no smoking   Substance Use Topics  . Alcohol use:  No  . Drug use: No    Family History  Problem Relation Age of Onset  . Heart disease Father        MI   . Heart disease Sister     Allergies  Allergen Reactions  . Chlorhexidine Anaphylaxis and Itching    Sensitive to dye in Betadine & Chlorohexadine   . Enoxaparin Sodium Anaphylaxis  . Heparin Anaphylaxis  . Hornet Venom Anaphylaxis    European Hornets  . Pork-Derived Products Anaphylaxis  . Povidone-Iodine Itching and Rash    NOTE: Patient has had anaphylactic reaction to Betadine due to dyes in product. Sensitivity-" but if its wiped  off she is able to tolerate betadine"   . Indomethacin Rash  . Lactose Intolerance (Gi) Diarrhea and Nausea And Vomiting    Can tolerate most [place] cheeses (Takes Lactaid)  . Phenylpropanolamine Hypertension    Medication list has been reviewed and updated.  Current Outpatient Medications on File Prior to Visit  Medication Sig Dispense Refill  . albuterol (PROVENTIL HFA;VENTOLIN HFA) 108 (90 Base) MCG/ACT inhaler Inhale 2 puffs into the lungs every 6 (six) hours as needed for wheezing or shortness of breath. 18 g 5  . atorvastatin (LIPITOR) 40 MG tablet TAKE 1 TABLET ONCE DAILY. 90 tablet 1  . azelastine (ASTELIN) 137 MCG/SPRAY nasal spray Place 1 spray into the nose 2 (two) times daily.     . beclomethasone (QVAR REDIHALER) 80 MCG/ACT inhaler Inhale 2 puffs into the lungs 3 (three) times daily. 10.6 g 5  . Calcium Carb-Cholecalciferol (CALCIUM 500 +D) 500-400 MG-UNIT TABS Take 1 tablet by mouth 2 (two) times daily.    . cyclobenzaprine (FLEXERIL) 10 MG tablet Take 10 mg by mouth 3 (three) times daily as needed for muscle spasms.    Marland Kitchen docusate sodium (COLACE) 100 MG capsule Take 200 mg by mouth 2 (two) times daily.     Marland Kitchen EPINEPHrine 0.3 mg/0.3 mL IJ SOAJ injection Inject 0.3 mg into the muscle once. As needed for anaphylaxis    . furosemide (LASIX) 40 MG tablet Take 1 tablet (40 mg total) by mouth daily. 90 tablet 3  . gabapentin  (NEURONTIN) 600 MG tablet Take 600-750 mg by mouth at bedtime.     . Glucosamine-Chondroitin 750-600 MG TABS Take 2 tablets by mouth 2 (two) times daily.     Marland Kitchen HYDROmorphone (DILAUDID) 4 MG tablet Take 4 mg by mouth daily.     . Loratadine-Pseudoephedrine (CLARITIN-D 12 HOUR PO) Take 1 tablet by mouth 2 (two) times daily.     . Melatonin 5 MG TABS Take 2.5-5 mg by mouth at bedtime as needed.    . metoprolol tartrate (LOPRESSOR) 100 MG tablet TAKE 1 TABLET BY MOUTH TWICE DAILY. 180 tablet 2  . montelukast (SINGULAIR) 10 MG tablet TAKE 1 TABLET ONCE DAILY IN THE EVENING. 90 tablet 1  . Multiple Vitamin (MULTIVITAMIN WITH MINERALS) TABS Take 1 tablet by mouth daily.    . nitroGLYCERIN (NITROSTAT) 0.4 MG SL tablet Place 1 tablet (0.4 mg total) under the tongue every 5 (five) minutes as needed for chest pain. 25 tablet 3  . nystatin (MYCOSTATIN/NYSTOP) powder Apply topically 3 (three) times daily. Use as needed for yeast 60 g 3  . potassium chloride (K-DUR) 10 MEQ tablet Take 4 tablets (40 mEq total) by mouth 2 (two) times daily. 240 tablet 6  . Respiratory Therapy Supplies (FLUTTER) DEVI Use as directed 1 each 0  . rivaroxaban (XARELTO) 20 MG TABS tablet Take 1 tablet (20 mg total) by mouth daily with supper. 90 tablet 3  . SYNTHROID 75 MCG tablet TAKE 1 TABLET BEFORE BREAKFAST. 90 tablet 1  . triamcinolone (NASACORT) 55 MCG/ACT nasal inhaler Place 1-2 sprays into the nose at bedtime.     . triamcinolone lotion (KENALOG) 0.1 %      No current facility-administered medications on file prior to visit.    Review of Systems:  As per HPI- otherwise negative.   Physical Examination: Vitals:   10/08/19 1321 10/08/19 1343  BP: (!) 149/78 140/90  Pulse: 93   Resp: 18   Temp: (!) 95.2 F (35.1 C)   SpO2:  99%    Vitals:   10/08/19 1321  Weight: 200 lb 3.2 oz (90.8 kg)  Height: 5\' 2"  (1.575 m)   Body mass index is 36.62 kg/m. Ideal Body Weight: Weight in (lb) to have BMI = 25: 136.4  GEN:  WDWN, NAD, Non-toxic, A & O x 3, overweight, looks well HEENT: Atraumatic, Normocephalic. Neck supple. No masses, No LAD. Ears and Nose: No external deformity. CV: RRR, No M/G/R. No JVD. No thrill. No extra heart sounds. PULM: CTA B, no wheezes, crackles, rhonchi. No retractions. No resp. distress. No accessory muscle use. ABD: S, NT, ND, +BS. No rebound. No HSM. EXTR: No c/c/e NEURO slow gait, uses walker due to knee and back pain PSYCH: Normally interactive. Conversant. Not depressed or anxious appearing.  Calm demeanor.  Foot exam: see notes   Assessment and Plan: Hypothyroidism, unspecified type - Plan: TSH  Pre-diabetes - Plan: Hemoglobin [id number], Basic metabolic panel, Microalbumin / creatinine urine ratio  Hyperlipidemia, unspecified hyperlipidemia type - Plan: Lipid panel  CAD in native artery  Essential hypertension - Plan: CBC  Mild intermittent asthma with exacerbation - Plan: fluticasone (FLOVENT HFA) 110 MCG/ACT inhaler  Here today for follow-up visit Labs pending as above Patient uses Qvar inhaler for mild asthma.  However, she notes that her inhaler seems to run out before the end of the month, in fact, it often does not even last 2 weeks.  Her pharmacist states this is a common problem.  We will have her try Flovent to see if this is better  Will plan further follow- up pending labs. Encouraged a Shingrix vaccine  Signed Lamar Blinks, MD  Received her labs, message to patient  Results for orders placed or performed in visit on 10/08/19  CBC  Result Value Ref Range   WBC 7.5 4.0 - 10.5 K/uL   RBC 5.27 (H) 3.87 - 5.11 Mil/uL   Platelets 249.0 150.0 - 400.0 K/uL   Hemoglobin 15.5 (H) 12.0 - 15.0 g/dL   HCT 46.0 36.0 - 46.0 %   MCV 87.4 78.0 - 100.0 fl   MCHC 33.7 30.0 - 36.0 g/dL   RDW 13.1 11.5 - 15.5 %  Hemoglobin A1c  Result Value Ref Range   Hgb A1c MFr Bld 6.9 (H) 4.6 - 6.5 %  Basic metabolic panel  Result Value Ref Range   Sodium 138 135 - 145  mEq/L   Potassium 4.3 3.5 - 5.1 mEq/L   Chloride 101 96 - 112 mEq/L   CO2 28 19 - 32 mEq/L   Glucose, Bld 133 (H) 70 - 99 mg/dL   BUN 23 6 - 23 mg/dL   Creatinine, Ser 0.75 0.40 - 1.20 mg/dL   GFR 75.82 >60.00 mL/min   Calcium 10.2 8.4 - 10.5 mg/dL  TSH  Result Value Ref Range   TSH 3.87 0.35 - 4.50 uIU/mL  Lipid panel  Result Value Ref Range   Cholesterol 170 0 - 200 mg/dL   Triglycerides 284.0 (H) 0.0 - 149.0 mg/dL   HDL 45.70 >39.00 mg/dL   VLDL 56.8 (H) 0.0 - 40.0 mg/dL   Total CHOL/HDL Ratio 4    NonHDL 124.77   Microalbumin / creatinine urine ratio  Result Value Ref Range   Microalb, Ur 2.3 (H) 0.0 - 1.9 mg/dL   Creatinine,U 17.7 mg/dL   Microalb Creat Ratio 12.8 0.0 - 30.0 mg/g  LDL cholesterol, direct  Result Value Ref Range   Direct LDL 81.0 mg/dL   Lipitor Lasix Metoprolol Potassium  40 mEq daily Xarelto Synthroid Singulair  A1c is stable

## 2019-10-08 ENCOUNTER — Other Ambulatory Visit: Payer: Self-pay

## 2019-10-08 ENCOUNTER — Ambulatory Visit (INDEPENDENT_AMBULATORY_CARE_PROVIDER_SITE_OTHER): Payer: Medicare Other | Admitting: Family Medicine

## 2019-10-08 ENCOUNTER — Encounter: Payer: Self-pay | Admitting: Family Medicine

## 2019-10-08 VITALS — BP 140/90 | HR 93 | Temp 95.2°F | Resp 18 | Ht 62.0 in | Wt 200.2 lb

## 2019-10-08 DIAGNOSIS — I10 Essential (primary) hypertension: Secondary | ICD-10-CM

## 2019-10-08 DIAGNOSIS — I6523 Occlusion and stenosis of bilateral carotid arteries: Secondary | ICD-10-CM

## 2019-10-08 DIAGNOSIS — R7303 Prediabetes: Secondary | ICD-10-CM

## 2019-10-08 DIAGNOSIS — E039 Hypothyroidism, unspecified: Secondary | ICD-10-CM | POA: Diagnosis not present

## 2019-10-08 DIAGNOSIS — E785 Hyperlipidemia, unspecified: Secondary | ICD-10-CM

## 2019-10-08 DIAGNOSIS — I2510 Atherosclerotic heart disease of native coronary artery without angina pectoris: Secondary | ICD-10-CM | POA: Diagnosis not present

## 2019-10-08 DIAGNOSIS — J4521 Mild intermittent asthma with (acute) exacerbation: Secondary | ICD-10-CM

## 2019-10-08 LAB — TSH: TSH: 3.87 u[IU]/mL (ref 0.35–4.50)

## 2019-10-08 LAB — CBC
HCT: 46 % (ref 36.0–46.0)
Hemoglobin: 15.5 g/dL — ABNORMAL HIGH (ref 12.0–15.0)
MCHC: 33.7 g/dL (ref 30.0–36.0)
MCV: 87.4 fl (ref 78.0–100.0)
Platelets: 249 10*3/uL (ref 150.0–400.0)
RBC: 5.27 Mil/uL — ABNORMAL HIGH (ref 3.87–5.11)
RDW: 13.1 % (ref 11.5–15.5)
WBC: 7.5 10*3/uL (ref 4.0–10.5)

## 2019-10-08 LAB — BASIC METABOLIC PANEL
BUN: 23 mg/dL (ref 6–23)
CO2: 28 mEq/L (ref 19–32)
Calcium: 10.2 mg/dL (ref 8.4–10.5)
Chloride: 101 mEq/L (ref 96–112)
Creatinine, Ser: 0.75 mg/dL (ref 0.40–1.20)
GFR: 75.82 mL/min (ref >60.00)
Glucose, Bld: 133 mg/dL — ABNORMAL HIGH (ref 70–99)
Potassium: 4.3 mEq/L (ref 3.5–5.1)
Sodium: 138 mEq/L (ref 135–145)

## 2019-10-08 LAB — LIPID PANEL
Cholesterol: 170 mg/dL (ref 0–200)
HDL: 45.7 mg/dL (ref >39.00)
NonHDL: 124.77
Total CHOL/HDL Ratio: 4
Triglycerides: 284 mg/dL — ABNORMAL HIGH (ref 0.0–149.0)
VLDL: 56.8 mg/dL — ABNORMAL HIGH (ref 0.0–40.0)

## 2019-10-08 LAB — LDL CHOLESTEROL, DIRECT: Direct LDL: 81 mg/dL

## 2019-10-08 LAB — MICROALBUMIN / CREATININE URINE RATIO
Creatinine,U: 17.7 mg/dL
Microalb Creat Ratio: 12.8 mg/g (ref 0.0–30.0)
Microalb, Ur: 2.3 mg/dL — ABNORMAL HIGH (ref 0.0–1.9)

## 2019-10-08 LAB — HEMOGLOBIN A1C: Hgb A1c MFr Bld: 6.9 % — ABNORMAL HIGH (ref 4.6–6.5)

## 2019-10-08 MED ORDER — FLOVENT HFA 110 MCG/ACT IN AERO: 1.0000 | INHALATION_SPRAY | Freq: Two times a day (BID) | RESPIRATORY_TRACT | 12 refills | Status: DC

## 2019-10-08 MED ORDER — LEVOTHYROXINE SODIUM 75 MCG PO TABS: ORAL_TABLET | ORAL | 1 refills | Status: DC

## 2019-10-08 MED ORDER — CLARITIN-D 12 HOUR 5-120 MG PO TB12: 1.0000 | ORAL_TABLET | Freq: Two times a day (BID) | ORAL | 5 refills | Status: DC

## 2019-10-08 MED ORDER — MONTELUKAST SODIUM 10 MG PO TABS: ORAL_TABLET | ORAL | 1 refills | Status: DC

## 2019-10-08 NOTE — Patient Instructions (Addendum)
Good to see you again today You might want to have the shingles vaccine given at your drug store at your convenience- Shingrix   We can change from [place] to [place] for your asthma- hopefully this inhaler will last longer for you!    Please get your blood drawn today and I will be in touch asap

## 2019-10-09 ENCOUNTER — Encounter: Payer: Self-pay | Admitting: Family Medicine

## 2019-10-22 ENCOUNTER — Ambulatory Visit (INDEPENDENT_AMBULATORY_CARE_PROVIDER_SITE_OTHER): Payer: Medicare Other | Admitting: Cardiovascular Disease

## 2019-10-22 ENCOUNTER — Encounter: Payer: Self-pay | Admitting: Cardiovascular Disease

## 2019-10-22 ENCOUNTER — Other Ambulatory Visit: Payer: Self-pay

## 2019-10-22 VITALS — BP 160/84 | HR 70 | Ht 62.0 in | Wt 177.8 lb

## 2019-10-22 DIAGNOSIS — I2510 Atherosclerotic heart disease of native coronary artery without angina pectoris: Secondary | ICD-10-CM | POA: Diagnosis not present

## 2019-10-22 DIAGNOSIS — I6523 Occlusion and stenosis of bilateral carotid arteries: Secondary | ICD-10-CM

## 2019-10-22 DIAGNOSIS — I10 Essential (primary) hypertension: Secondary | ICD-10-CM | POA: Diagnosis not present

## 2019-10-22 DIAGNOSIS — E785 Hyperlipidemia, unspecified: Secondary | ICD-10-CM

## 2019-10-22 DIAGNOSIS — I480 Paroxysmal atrial fibrillation: Secondary | ICD-10-CM | POA: Diagnosis not present

## 2019-10-22 DIAGNOSIS — I5032 Chronic diastolic (congestive) heart failure: Secondary | ICD-10-CM

## 2019-10-22 MED ORDER — RIVAROXABAN 20 MG PO TABS: 20.0000 mg | ORAL_TABLET | Freq: Every day | ORAL | 1 refills | Status: DC

## 2019-10-22 MED ORDER — METOPROLOL TARTRATE 100 MG PO TABS: 100.0000 mg | ORAL_TABLET | Freq: Two times a day (BID) | ORAL | 3 refills | Status: DC

## 2019-10-22 MED ORDER — FUROSEMIDE 40 MG PO TABS: 40.0000 mg | ORAL_TABLET | Freq: Every day | ORAL | 3 refills | Status: DC

## 2019-10-22 MED ORDER — ATORVASTATIN CALCIUM 40 MG PO TABS: 40.0000 mg | ORAL_TABLET | Freq: Every day | ORAL | 3 refills | Status: DC

## 2019-10-22 NOTE — Patient Instructions (Signed)
Medication Instructions:  Your physician recommends that you continue on your current medications as directed. Please refer to the Current Medication list given to you today.  *If you need a refill on your cardiac medications before your next appointment, please call your pharmacy*  Lab Work: None ordered  If you have labs (blood work) drawn today and your tests are completely normal, you will receive your results only by: Marland Kitchen MyChart Message (if you have MyChart) OR . A paper copy in the mail If you have any lab test that is abnormal or we need to change your treatment, we will call you to review the results.  Testing/Procedures: Your physician has requested that you have a carotid duplex in June 2021. This test is an ultrasound of the carotid arteries in your neck. It looks at blood flow through these arteries that supply the brain with blood. Allow one hour for this exam. There are no restrictions or special instructions.  Follow-Up: At [facility], you and your health needs are our priority.  As part of our continuing mission to provide you with exceptional heart care, we have created designated Provider Care Teams.  These Care Teams include your primary Cardiologist (physician) and Advanced Practice Providers (APPs -  Physician Assistants and Nurse Practitioners) who all work together to provide you with the care you need, when you need it.  Your next appointment:   12 month(s)  The format for your next appointment:   In Person  Provider:   You may see Lauree Chandler, MD or one of the following Advanced Practice Providers on your designated Care Team:    Melina Copa, PA-C  Ermalinda Barrios, PA-C   Other Instructions

## 2019-10-22 NOTE — Progress Notes (Signed)
Chief Complaint  Patient presents with  . Follow-up    CAD    History of Present Illness: 73 yo female with history of PAF, HTN, hyperlipidemia, anti-phospholipid antibody syndrome, GERD, asthma, CAD here today for cardiac follow up. Cardiac cath in 2003 with moderate CAD (30% ostial LM stenosis, 50% mid LAD, 80% moderate sized Diagonal, 80% Circumflex). She has had no cath since then. She has chronic back pain. She has a hypercoagulable state and is on Xarelto. Most recent stress test October 2013 with no ischemia, normal LV function.  Last echo December 2018 with normal LV systolic function, grade 2 diastolic dysfunction and trivial mitral regurgitation. Carotid artery dopplers June 2018 with stable 40-59% bilateral stenosis. She was admitted to [facility] August 2017 with sepsis, ascending cholangitis complicated by respiratory failure, renal failure, CHF.  She underwent lap cholecystectomy 10/04/16. When seen in our office in December 2018, her BP was elevated and PVCs were present. Metoprolol was increased to 100 mg BID.   She is here today for follow up. The patient denies any chest pain, dyspnea, palpitations, lower extremity edema, orthopnea, PND, dizziness, near syncope or syncope.    Primary Care Physician: Darreld Mclean, MD  Past Medical History:  Diagnosis Date  . Anemia, iron deficiency   . Antiphospholipid syndrome (HCC)    with hypercoagulable state  . Asthma    extrinsic; moderate, persistant, nml spirometry 2010, nml CXR 1/08  . Bladder troubles    REPORTS INFECTIONS ON OCCASION DUE TO URETHRA MEATUS STRICTURE AT BIRTH   . CAD (coronary artery disease)    a. 50% mid LAD, 80% ostial D1 and moderate 80% mid circ by cath 2003. b. nonischemic nuc in 2013.  Marland Kitchen Cancer ([place])    basal cell removed fr. L arm   . Carotid arterial disease ([place])    a. 03/2017 showed 1-39% bilateral internal carotid stenosis (high end range on left), >50% LECA stenosis, with recommendation  for f/u duplex 03/2018.  Marland Kitchen Complication of anesthesia    cardiac arrest- in OR, at age 41 (89)y.o. during Scalenotomy in her early 20's  . Drug allergy    heparin/lovenox  . DVT (deep venous thrombosis) ([place])   . Erosive gastritis 1994  . Family history of adverse reaction to anesthesia    some family members have had trouble waking up  . GERD (gastroesophageal reflux disease)   . H/O hiatal hernia   . HLD (hyperlipidemia)   . HTN (hypertension)    Pt states her high blood pressure readings are due tot he method of measurementsMc[facility]haney at [employer], manages pt.  LOV 03/2014  . Hypothyroidism   . Liver spot    cyst- - no biopsy, but told that its benign   . Moderate mitral regurgitation   . PAF (paroxysmal atrial fibrillation) ([place])   . Pulmonary embolism ([place])    related to back surgery with prior coumadin use, now off  . PVC's (premature ventricular contractions)    a. noted in 09/2017.  Marland Kitchen Spondylosis, lumbosacral    ARTHRITIS- OA     Past Surgical History:  Procedure Laterality Date  . ABDOMINAL HYSTERECTOMY     partial abdominal -1998  . BACK SURGERY     x13 cervical and lumbar thoracic spine surgery  . BREAST ENHANCEMENT SURGERY    . BREAST SURGERY     all benign cysts x3   . CARDIAC CATHETERIZATION     2005  . CHOLECYSTECTOMY N/A 10/04/2016   Procedure:  LAPAROSCOPIC CHOLECYSTECTOMY;  Surgeon: Erroll Luna, MD;  Location: [facility];  Service: General;  Laterality: N/A;  . cleft lip and palate repair     73 yo   . hysterectomy    . IR GENERIC HISTORICAL  06/04/2016   IR PERC CHOLECYSTOSTOMY 06/04/2016 Greggory Keen, MD [facility]  . IR GENERIC HISTORICAL  08/06/2016   IR CHOLANGIOGRAM EXISTING TUBE 08/06/2016 Aletta Edouard, MD [facility]  . IR GENERIC HISTORICAL  07/17/2016   IR RADIOLOGIST EVAL & MGMT 07/17/2016 [facility]  . JOINT REPLACEMENT    . POSTERIOR CERVICAL FUSION/FORAMINOTOMY Right 08/31/2014   Procedure: Right cervical six-seven foraminotomy,  Cervical five-Thoracic one fusion, Cervical six-seven lateral mass screws;  Surgeon: Erline Levine, MD;  Location: [facility];  Service: Neurosurgery;  Laterality: Right;  . spina bifida repair     73 yo   . TONSILLECTOMY    . TOTAL KNEE ARTHROPLASTY     left    Current Outpatient Medications  Medication Sig Dispense Refill  . albuterol (PROVENTIL HFA;VENTOLIN HFA) 108 (90 Base) MCG/ACT inhaler Inhale 2 puffs into the lungs every 6 (six) hours as needed for wheezing or shortness of breath. 18 g 5  . atorvastatin (LIPITOR) 40 MG tablet Take 1 tablet (40 mg total) by mouth daily. 90 tablet 3  . azelastine (ASTELIN) 137 MCG/SPRAY nasal spray Place 1 spray into the nose 2 (two) times daily.     . Calcium Carb-Cholecalciferol (CALCIUM 500 +D) 500-400 MG-UNIT TABS Take 1 tablet by mouth 2 (two) times daily.    . cyclobenzaprine (FLEXERIL) 10 MG tablet Take 10 mg by mouth 3 (three) times daily as needed for muscle spasms.    Marland Kitchen docusate sodium (COLACE) 100 MG capsule Take 200 mg by mouth 2 (two) times daily.     Marland Kitchen EPINEPHrine 0.3 mg/0.3 mL IJ SOAJ injection Inject 0.3 mg into the muscle once. As needed for anaphylaxis    . fluticasone (FLOVENT HFA) 110 MCG/ACT inhaler Inhale 1-2 puffs into the lungs 2 (two) times daily. 12 g 12  . furosemide (LASIX) 40 MG tablet Take 1 tablet (40 mg total) by mouth daily. 90 tablet 3  . gabapentin (NEURONTIN) 600 MG tablet Take 600-750 mg by mouth at bedtime.     . Glucosamine-Chondroitin 750-600 MG TABS Take 2 tablets by mouth 2 (two) times daily.     Marland Kitchen HYDROmorphone (DILAUDID) 4 MG tablet Take 4 mg by mouth daily.     Marland Kitchen levothyroxine (SYNTHROID) 75 MCG tablet TAKE 1 TABLET BEFORE BREAKFAST. 90 tablet 1  . loratadine-pseudoephedrine (CLARITIN-D 12 HOUR) 5-120 MG tablet Take 1 tablet by mouth 2 (two) times daily. 30 tablet 5  . Melatonin 5 MG TABS Take 2.5-5 mg by mouth at bedtime as needed.    . metoprolol tartrate (LOPRESSOR) 100 MG tablet Take 1 tablet (100 mg  total) by mouth 2 (two) times daily. 180 tablet 3  . montelukast (SINGULAIR) 10 MG tablet TAKE 1 TABLET ONCE DAILY IN THE EVENING. 90 tablet 1  . Multiple Vitamin (MULTIVITAMIN WITH MINERALS) TABS Take 1 tablet by mouth daily.    . nitroGLYCERIN (NITROSTAT) 0.4 MG SL tablet Place 1 tablet (0.4 mg total) under the tongue every 5 (five) minutes as needed for chest pain. 25 tablet 3  . nystatin (MYCOSTATIN/NYSTOP) powder Apply topically 3 (three) times daily. Use as needed for yeast 60 g 3  . potassium chloride (K-DUR) 10 MEQ tablet Take 4 tablets (40 mEq total) by mouth 2 (  two) times daily. 240 tablet 6  . Respiratory Therapy Supplies (FLUTTER) DEVI Use as directed 1 each 0  . rivaroxaban (XARELTO) 20 MG TABS tablet Take 1 tablet (20 mg total) by mouth daily with supper. 90 tablet 1  . triamcinolone (NASACORT) 55 MCG/ACT nasal inhaler Place 1-2 sprays into the nose at bedtime.     . triamcinolone lotion (KENALOG) 0.1 %      No current facility-administered medications for this visit.    Allergies  Allergen Reactions  . Chlorhexidine Anaphylaxis and Itching    Sensitive to dye in Betadine & Chlorohexadine   . Enoxaparin Sodium Anaphylaxis  . Heparin Anaphylaxis  . Hornet Venom Anaphylaxis    European Hornets  . Pork-Derived Products Anaphylaxis  . Povidone-Iodine Itching and Rash    NOTE: Patient has had anaphylactic reaction to Betadine due to dyes in product. Sensitivity-" but if its wiped off she is able to tolerate betadine"   . Indomethacin Rash  . Lactose Intolerance (Gi) Diarrhea and Nausea And Vomiting    Can tolerate most [place] cheeses (Takes Lactaid)  . Phenylpropanolamine Hypertension    Social History   Socioeconomic History  . Marital status: Married    Spouse name: Not on file  . Number of children: Not on file  . Years of education: Not on file  . Highest education level: Not on file  Occupational History  . Not on file  Tobacco Use  . Smoking status: Never  Smoker  . Smokeless tobacco: Never Used  . Tobacco comment: no smoking   Substance and Sexual Activity  . Alcohol use: No  . Drug use: No  . Sexual activity: Not Currently  Other Topics Concern  . Not on file  Social History Narrative   Lives in [place] with husband; has had miscarriages but no children.    Disabled but exercises nearly every day (can only exericse in water - aerobics)   Takes opioids for pain relief; takes no herbal medications; has a heart healthy diet.    Social Determinants of Health   Financial Resource Strain:   . Difficulty of Paying Living Expenses: Not on file  Food Insecurity:   . Worried About Charity fundraiser in the Last Year: Not on file  . Ran Out of Food in the Last Year: Not on file  Transportation Needs:   . Lack of Transportation (Medical): Not on file  . Lack of Transportation (Non-Medical): Not on file  Physical Activity:   . Days of Exercise per Week: Not on file  . Minutes of Exercise per Session: Not on file  Stress:   . Feeling of Stress : Not on file  Social Connections:   . Frequency of Communication with Friends and Family: Not on file  . Frequency of Social Gatherings with Friends and Family: Not on file  . Attends Religious Services: Not on file  . Active Member of Clubs or Organizations: Not on file  . Attends Archivist Meetings: Not on file  . Marital Status: Not on file  Intimate Partner Violence:   . Fear of Current or Ex-Partner: Not on file  . Emotionally Abused: Not on file  . Physically Abused: Not on file  . Sexually Abused: Not on file    Family History  Problem Relation Age of Onset  . Heart disease Father        MI   . Heart disease Sister     Review of Systems:  As stated  in the HPI and otherwise negative.   BP (!) 160/84   Pulse 70   Ht 5\' 2"  (1.575 m)   Wt 177 lb 12.8 oz (80.6 kg)   SpO2 95%   BMI 32.52 kg/m   Physical Examination:  General: Well developed, well nourished, NAD  HEENT:  OP clear, mucus membranes moist  SKIN: warm, dry. No rashes. Neuro: No focal deficits  Musculoskeletal: Muscle strength 5/5 all ext  Psychiatric: Mood and affect normal  Neck: No JVD, no carotid bruits, no thyromegaly, no lymphadenopathy.  Lungs:Clear bilaterally, no wheezes, rhonci, crackles Cardiovascular: Regular rate and rhythm. No murmurs, gallops or rubs. Abdomen:Soft. Bowel sounds present. Non-tender.  Extremities: No lower extremity edema. Pulses are 2 + in the bilateral DP/PT.  Echo December 2018:  - Left ventricle: The cavity size was normal. Wall thickness was   normal. Systolic function was normal. The estimated ejection   fraction was in the range of 50% to 55%. Wall motion was normal;   there were no regional wall motion abnormalities. Features are   consistent with a pseudonormal left ventricular filling pattern,   with concomitant abnormal relaxation and increased filling   pressure (grade 2 diastolic dysfunction).  Cardiac cath 09/22/02: 1. Ventriculography was performed in the RAO projection. Overall ejection  fraction was 70%. No segmental wall motion abnormalities were  identified.  2. The left main coronary artery has, what appears to be, about 30%  narrowing at the ostium. In most views there does appear to be a patent  ostium; however, some tapered narrowing cannot be excluded.  3. The LAD proper has about 40-50% narrowing, at most, in the mid portion  beyond the diagonal takeoff. The vessel is calcified. The diagonal  takeoff itself has a long 80% stenosis at the ostium extending out into  the mid portion of the diagonal. The distal diagonal does appear to be  suitable for grafting.  4. The large circumflex has about 70-80% focal narrowing prior to the  bifurcation of the marginal.  5. The right coronary artery has some mild luminal irregularities but no  high-grade stenoses.  IMPRESSION:  1. Coronary artery disease with significant involvement and  circumflex  involvement.  2. Multiple medical issues, as described above.  EKG:  EKG is ordered today. The ekg ordered today demonstrates NSR, rate 68 bpm  Recent Labs: 12/05/2018: Brain Natriuretic Peptide 36 04/08/2019: ALT 24 10/08/2019: BUN 23; Creatinine, Ser 0.75; Hemoglobin 15.5; Platelets 249.0; Potassium 4.3; Sodium 138; TSH 3.87   Lipid Panel Lipid Panel     Component Value Date/Time   CHOL 170 10/08/2019 1409   TRIG 284.0 (H) 10/08/2019 1409   HDL 45.70 10/08/2019 1409   CHOLHDL 4 10/08/2019 1409   VLDL 56.8 (H) 10/08/2019 1409   LDLDIRECT 81.0 10/08/2019 1409     Wt Readings from Last 3 Encounters:  10/22/19 177 lb 12.8 oz (80.6 kg)  10/08/19 200 lb 3.2 oz (90.8 kg)  04/23/19 199 lb 6.4 oz (90.4 kg)     Other studies Reviewed: Additional studies/ records that were reviewed today include: . Review of the above records demonstrates:    Assessment and Plan:   1. CAD without angina: She is known to have moderate CAD by cath in 2003. LV function normal by echo in December 2018. No chest pain. Continue statin and beta blocker. No ASA due to easy bruising while on Xarelto as well.    2. Atrial fib, paroxysmal: She is in sinus today. Continue beta  blocker and Xarelto.     3. HYPERTENSION: BP is well controlled at home. No changes  4. CAROTID ARTERY DISEASE: Mild bilateral carotid disease by dopplers June 2019. Repeat in June 2021.   5. Hyperlipidemia: Lipids followed in primary care per pt. Will continue statin.   6. Chronic diastolic CHF: Weight is stable. No evidence of volume overload. Continue Lasix.   Current medicines are reviewed at length with the patient today.  The patient does not have concerns regarding medicines.  The following changes have been made:  no change  Labs/ tests ordered today include:   Orders Placed This Encounter  Procedures  . EKG 12-Lead  . VAS US CAROTID   Disposition:   FU with me in 12  months  Signed, Lauree Chandler, MD 10/22/2019 4:27 PM    [facility] [address], [place], [place]  [address] Phone: [phone]; Fax: [phone]

## 2019-12-05 ENCOUNTER — Ambulatory Visit: Payer: Medicare Other | Attending: Internal Medicine

## 2019-12-05 DIAGNOSIS — Z23 Encounter for immunization: Secondary | ICD-10-CM | POA: Insufficient documentation

## 2019-12-05 NOTE — Progress Notes (Signed)
.    Covid-19 Vaccination Clinic  Name:  Latoya [name]    MRN: [id number] DOB: 1946/07/08  12/05/2019  Latoya [name] was observed post Covid-19 immunization for 30 minutes based on pre-vaccination screening without incidence. She was provided with Vaccine Information Sheet and instruction to access the V-Safe system.   Latoya [name] was instructed to call 911 with any severe reactions post vaccine: Marland Kitchen Difficulty breathing  . Swelling of your face and throat  . A fast heartbeat  . A bad rash all over your body  . Dizziness and weakness    Immunizations Administered    Name Date Dose VIS Date Route   Pfizer COVID-19 Vaccine 12/05/2019  3:12 PM 0.3 mL 10/02/2019 Intramuscular   Manufacturer: [place]   Lot: [id number]   [place]: [id number]

## 2019-12-28 ENCOUNTER — Ambulatory Visit: Payer: Medicare Other | Attending: Internal Medicine

## 2019-12-28 DIAGNOSIS — Z23 Encounter for immunization: Secondary | ICD-10-CM | POA: Insufficient documentation

## 2019-12-28 NOTE — Progress Notes (Signed)
   Covid-19 Vaccination Clinic  Name:  Latoya [name]    MRN: [id number] DOB: 1946-05-18  12/28/2019  [name] was observed post Covid-19 immunization for 15 minutes without incident. She was provided with Vaccine Information Sheet and instruction to access the V-Safe system.   [name] was instructed to call 911 with any severe reactions post vaccine: Marland Kitchen Difficulty breathing  . Swelling of face and throat  . A fast heartbeat  . A bad rash all over body  . Dizziness and weakness   Immunizations Administered    Name Date Dose VIS Date Route   Pfizer COVID-19 Vaccine 12/28/2019  4:00 PM 0.3 mL 10/02/2019 Intramuscular   Manufacturer: [place]   Lot: [id number]   [place]: [id number]

## 2020-01-21 ENCOUNTER — Other Ambulatory Visit: Payer: Self-pay | Admitting: Cardiovascular Disease

## 2020-02-04 ENCOUNTER — Other Ambulatory Visit: Payer: Self-pay | Admitting: Cardiovascular Disease

## 2020-02-25 DIAGNOSIS — Z79891 Long term (current) use of opiate analgesic: Secondary | ICD-10-CM | POA: Diagnosis not present

## 2020-02-25 DIAGNOSIS — G894 Chronic pain syndrome: Secondary | ICD-10-CM | POA: Diagnosis not present

## 2020-02-25 DIAGNOSIS — M4722 Other spondylosis with radiculopathy, cervical region: Secondary | ICD-10-CM | POA: Diagnosis not present

## 2020-02-25 DIAGNOSIS — M963 Postlaminectomy kyphosis: Secondary | ICD-10-CM | POA: Diagnosis not present

## 2020-03-23 ENCOUNTER — Ambulatory Visit (INDEPENDENT_AMBULATORY_CARE_PROVIDER_SITE_OTHER): Payer: Medicare Other | Admitting: Family Medicine

## 2020-03-23 ENCOUNTER — Encounter: Payer: Self-pay | Admitting: Family Medicine

## 2020-03-23 ENCOUNTER — Other Ambulatory Visit: Payer: Self-pay

## 2020-03-23 VITALS — BP 132/80 | HR 90 | Temp 96.0°F | Resp 18 | Ht 62.0 in | Wt 202.0 lb

## 2020-03-23 DIAGNOSIS — E119 Type 2 diabetes mellitus without complications: Secondary | ICD-10-CM

## 2020-03-23 DIAGNOSIS — I739 Peripheral vascular disease, unspecified: Secondary | ICD-10-CM

## 2020-03-23 DIAGNOSIS — G8929 Other chronic pain: Secondary | ICD-10-CM

## 2020-03-23 DIAGNOSIS — I10 Essential (primary) hypertension: Secondary | ICD-10-CM

## 2020-03-23 DIAGNOSIS — I2510 Atherosclerotic heart disease of native coronary artery without angina pectoris: Secondary | ICD-10-CM

## 2020-03-23 DIAGNOSIS — E039 Hypothyroidism, unspecified: Secondary | ICD-10-CM | POA: Diagnosis not present

## 2020-03-23 DIAGNOSIS — M25561 Pain in right knee: Secondary | ICD-10-CM | POA: Diagnosis not present

## 2020-03-23 DIAGNOSIS — E785 Hyperlipidemia, unspecified: Secondary | ICD-10-CM | POA: Diagnosis not present

## 2020-03-23 NOTE — Patient Instructions (Addendum)
It was good to see you again today, I will be in touch with your labs ASAP Please see me in about 6 months for your next visit   Please consider getting your shingles vaccine- Shingrix- at your convenience   We will refer you to see [facility] ortho in [place] about your knee issue

## 2020-03-23 NOTE — Progress Notes (Addendum)
[facility] [place], [place], [place] [address] [phone]  Date:  03/23/2020   Name:  Latoya [name]   DOB:  31-Oct-1945   MRN:  [id number]  PCP:  Darreld Mclean, MD    Chief Complaint: Diabetic Foot Exam   History of Present Illness:  Latoya [name] is a 74 y.o. very pleasant female patient who presents with the following:  Patient here today for 89-month follow-up visit Last seen by myself in December History of A. fib, DVT and PE due to coagulation disorder/antiphospholipid antibody syndrome, hypertension, CAD, hypothyroidism, controlled diabetes  Married to [name] who is also my patient Needs an A1c today Also foot exam Never smoker Covid series up-to-date  She saw cardiology also about 6 months ago, Dr. Angelena Form.  She is actually seeing him next week and will do carotid US at time as well  Most recent cardiac cath was 2003.  Stress test 2013 with no ischemia, normal LV function.  Most recent echo [id number] showed diastolic dysfunction, otherwise okay Per his most recent note 1. CAD without angina: She is known to have moderate CAD by cath in 2003. LV function normal by echo in December 2018. No chest pain. Continue statin and beta blocker. No ASA due to easy bruising while on Xarelto as well.   2. Atrial fib, paroxysmal: She is in sinus today. Continue beta blocker and Xarelto.    3. HYPERTENSION: BP is well controlled at home. No changes 4. CAROTID ARTERY DISEASE: Mild bilateral carotid disease by dopplers June 2019. Repeat in June 2021.  5. Hyperlipidemia: Lipids followed in primary care per pt. Will continue statin.  6. Chronic diastolic CHF: Weight is stable. No evidence of volume overload. Continue Lasix.    Lab Results  Component Value Date   HGBA1C 6.9 (H) 10/08/2019   Lipitor 40 Flovent Lasix 40 daily Synthroid Metoprolol Singulair Potassium 40 mEq twice daily Xarelto Patient Active Problem  List   Diagnosis Date Noted  . Spondylosis, lumbosacral   . Pulmonary embolism ([place])   . PAF (paroxysmal atrial fibrillation) ([place])   . Moderate mitral regurgitation   . H/O hiatal hernia   . DVT (deep venous thrombosis) ([place])   . Complication of anesthesia   . Carotid arterial disease ([place])   . Cancer ([place])   . Antiphospholipid syndrome ([place])   . Anemia, iron deficiency   . Diabetes mellitus without complication ([place]) [id number]  . Leukocytosis 08/04/2016  . Hypothyroidism 08/04/2016  . Anemia of chronic disease 08/04/2016  . Gastroparesis   . Enterococcal bacteremia 06/06/2016  . Bacteremia due to Klebsiella pneumoniae 06/06/2016  . Bacteremia due to Escherichia coli 06/06/2016  . Ascending cholangitis 06/04/2016  . Hemangioma of liver 06/04/2016  . Cervical pseudoarthrosis ([place]) 08/31/2014  . Chest pain, atypical 04/11/2013  . PAD (peripheral artery disease) ([place]) 06/20/2011  . BACK PAIN, CHRONIC 06/01/2010  . CAD in native artery 11/15/2009  . MURMUR 11/15/2009  . CAROTID BRUIT, LEFT 11/15/2009  . Essential hypertension 03/18/2009  . Asthma 03/18/2009  . GERD 03/18/2009  . Hyperlipidemia 11/29/2008  . Erosive gastritis 10/22/1992    Past Medical History:  Diagnosis Date  . Anemia, iron deficiency   . Antiphospholipid syndrome (HCC)    with hypercoagulable state  . Asthma    extrinsic; moderate, persistant, nml spirometry 2010, nml CXR 1/08  . Bladder troubles    REPORTS INFECTIONS ON OCCASION DUE TO URETHRA MEATUS  STRICTURE AT BIRTH   . CAD (coronary artery disease)    a. 50% mid LAD, 80% ostial D1 and moderate 80% mid circ by cath 2003. b. nonischemic nuc in 2013.  Marland Kitchen Cancer ([place])    basal cell removed fr. L arm   . Carotid arterial disease ([place])    a. 03/2017 showed 1-39% bilateral internal carotid stenosis (high end range on left), >50% LECA stenosis, with recommendation for f/u duplex 03/2018.  Marland Kitchen Complication of anesthesia    cardiac arrest- in OR, at age 66  (21)y.o. during Scalenotomy in her early 20's  . Drug allergy    heparin/lovenox  . DVT (deep venous thrombosis) ([place])   . Erosive gastritis 1994  . Family history of adverse reaction to anesthesia    some family members have had trouble waking up  . GERD (gastroesophageal reflux disease)   . H/O hiatal hernia   . HLD (hyperlipidemia)   . HTN (hypertension)    Pt states her high blood pressure readings are due tot he method of measurements[facility] at [facility], manages pt.  LOV 03/2014  . Hypothyroidism   . Liver spot    cyst- - no biopsy, but told that its benign   . Moderate mitral regurgitation   . PAF (paroxysmal atrial fibrillation) ([employer])   . Pulmonary embolism ([place])    related to back surgery with prior coumadin use, now off  . PVC's (premature ventricular contractions)    a. noted in 09/2017.  Marland Kitchen Spondylosis, lumbosacral    ARTHRITIS- OA     Past Surgical History:  Procedure Laterality Date  . ABDOMINAL HYSTERECTOMY     partial abdominal -1998  . BACK SURGERY     x13 cervical and lumbar thoracic spine surgery  . BREAST ENHANCEMENT SURGERY    . BREAST SURGERY     all benign cysts x3   . CARDIAC CATHETERIZATION     2005  . CHOLECYSTECTOMY N/A 10/04/2016   Procedure: LAPAROSCOPIC CHOLECYSTECTOMY;  Surgeon: Erroll Luna, MD;  Location: [facility] OR;  Service: General;  Laterality: N/A;  . cleft lip and palate repair     74 yo   . hysterectomy    . IR GENERIC HISTORICAL  06/04/2016   IR PERC CHOLECYSTOSTOMY 06/04/2016 Greggory Keen, MD [facility]  . IR GENERIC HISTORICAL  08/06/2016   IR CHOLANGIOGRAM EXISTING TUBE 08/06/2016 Aletta Edouard, MD [facility]  . IR GENERIC HISTORICAL  07/17/2016   IR RADIOLOGIST EVAL & MGMT 07/17/2016 [facility] INTER[facility]  . JOINT REPLACEMENT    . POSTERIOR CERVICAL FUSION/FORAMINOTOMY Right 08/31/2014   Procedure: Right cervical six-seven foraminotomy, Cervical five-Thoracic one fusion, Cervical six-seven lateral mass screws;  Surgeon: Erline Levine, MD;  Location: [facility];  Service: Neurosurgery;  Laterality: Right;  . spina bifida repair     74 yo   . TONSILLECTOMY    . TOTAL KNEE ARTHROPLASTY     left    Social History   Tobacco Use  . Smoking status: Never Smoker  . Smokeless tobacco: Never Used  . Tobacco comment: no smoking   Substance Use Topics  . Alcohol use: No  . Drug use: No    Family History  Problem Relation Age of Onset  . Heart disease Father        MI   . Heart disease Sister     Allergies  Allergen Reactions  . Chlorhexidine Anaphylaxis and Itching    Sensitive to dye in Betadine & Chlorohexadine   .  Enoxaparin Sodium Anaphylaxis  . Heparin Anaphylaxis  . Hornet Venom Anaphylaxis    European Hornets  . Pork-Derived Products Anaphylaxis  . Povidone-Iodine Itching and Rash    NOTE: Patient has had anaphylactic reaction to Betadine due to dyes in product. Sensitivity-" but if its wiped off she is able to tolerate betadine"   . Indomethacin Rash  . Lactose Intolerance (Gi) Diarrhea and Nausea And Vomiting    Can tolerate most [place] cheeses (Takes Lactaid)  . Phenylpropanolamine Hypertension    Medication list has been reviewed and updated.  Current Outpatient Medications on File Prior to Visit  Medication Sig Dispense Refill  . albuterol (PROVENTIL HFA;VENTOLIN HFA) 108 (90 Base) MCG/ACT inhaler Inhale 2 puffs into the lungs every 6 (six) hours as needed for wheezing or shortness of breath. 18 g 5  . atorvastatin (LIPITOR) 40 MG tablet Take 1 tablet (40 mg total) by mouth daily. 90 tablet 3  . azelastine (ASTELIN) 137 MCG/SPRAY nasal spray Place 1 spray into the nose 2 (two) times daily.     . Calcium Carb-Cholecalciferol (CALCIUM 500 +D) 500-400 MG-UNIT TABS Take 1 tablet by mouth 2 (two) times daily.    . cyclobenzaprine (FLEXERIL) 10 MG tablet Take 10 mg by mouth 3 (three) times daily as needed for muscle spasms.    Marland Kitchen docusate sodium (COLACE) 100 MG capsule Take 200 mg by mouth 2  (two) times daily.     Marland Kitchen EPINEPHrine 0.3 mg/0.3 mL IJ SOAJ injection Inject 0.3 mg into the muscle once. As needed for anaphylaxis    . fluticasone (FLOVENT HFA) 110 MCG/ACT inhaler Inhale 1-2 puffs into the lungs 2 (two) times daily. 12 g 12  . furosemide (LASIX) 40 MG tablet Take 1 tablet (40 mg total) by mouth daily. 90 tablet 3  . gabapentin (NEURONTIN) 600 MG tablet Take 600-750 mg by mouth at bedtime.     . Glucosamine-Chondroitin 750-600 MG TABS Take 2 tablets by mouth 2 (two) times daily.     Marland Kitchen HYDROmorphone (DILAUDID) 4 MG tablet Take 4 mg by mouth daily.     Marland Kitchen levothyroxine (SYNTHROID) 75 MCG tablet TAKE 1 TABLET BEFORE BREAKFAST. 90 tablet 1  . loratadine-pseudoephedrine (CLARITIN-D 12 HOUR) 5-120 MG tablet Take 1 tablet by mouth 2 (two) times daily. 30 tablet 5  . Melatonin 5 MG TABS Take 2.5-5 mg by mouth at bedtime as needed.    . metoprolol tartrate (LOPRESSOR) 100 MG tablet Take 1 tablet (100 mg total) by mouth 2 (two) times daily. 180 tablet 3  . montelukast (SINGULAIR) 10 MG tablet TAKE 1 TABLET ONCE DAILY IN THE EVENING. 90 tablet 1  . Multiple Vitamin (MULTIVITAMIN WITH MINERALS) TABS Take 1 tablet by mouth daily.    . nitroGLYCERIN (NITROSTAT) 0.4 MG SL tablet DISSOLVE 1 TABLET UNDER TONGUE AS NEEDED FOR CHEST PAIN,MAY REPEAT IN5 MINUTES FOR 2 DOSES. 25 tablet 5  . nystatin (MYCOSTATIN/NYSTOP) powder Apply topically 3 (three) times daily. Use as needed for yeast 60 g 3  . potassium chloride (KLOR-CON) 10 MEQ tablet TAKE 4 TABLETS TWICE DAILY. 240 tablet 6  . Respiratory Therapy Supplies (FLUTTER) DEVI Use as directed 1 each 0  . rivaroxaban (XARELTO) 20 MG TABS tablet Take 1 tablet (20 mg total) by mouth daily with supper. 90 tablet 1  . triamcinolone (NASACORT) 55 MCG/ACT nasal inhaler Place 1-2 sprays into the nose at bedtime.     . triamcinolone lotion (KENALOG) 0.1 %      No current facility-administered  medications on file prior to visit.    Review of Systems:  As  per HPI- otherwise negative.   Physical Examination: Vitals:   03/23/20 1540  BP: 132/80  Pulse: 90  Resp: 18  Temp: (!) 96 F (35.6 C)  SpO2: 97%   Vitals:   03/23/20 1540  Weight: 202 lb (91.6 kg)  Height: 5\' 2"  (1.575 m)   Body mass index is 36.95 kg/m. Ideal Body Weight: Weight in (lb) to have BMI = 25: 136.4  GEN: no acute distress.  Obese, looks well. Uses a walker  HEENT: Atraumatic, Normocephalic.  Ears and Nose: No external deformity. CV: RRR, No M/G/R. No JVD. No thrill. No extra heart sounds. PULM: CTA B, no wheezes, crackles, rhonchi. No retractions. No resp. distress. No accessory muscle use. EXTR: No c/c/e PSYCH: Normally interactive. Conversant.  Feet are swollen, poor circulation with pressure discoloration due to venous pooling, cannot palpate  pulses Normal sensation however  Patient endorses that her feet are not painful, she states no change in their appearance over the last 4 years  Assessment and Plan: Diabetes mellitus without complication ([place]) - Plan: Hemoglobin A1c  Hypothyroidism, unspecified type - Plan: TSH  Hyperlipidemia, unspecified hyperlipidemia type - Plan: Lipid panel  Essential hypertension - Plan: CBC, Comprehensive metabolic panel  CAD in native artery  PAD (peripheral artery disease) (HCC)  Chronic pain of right knee - Plan: Ambulatory referral to Orthopedic Surgery  Here today for routine follow-up visit.  Foot exam performed, routine labs as above Her health nurses are otherwise up-to-date, Covid vaccine is done She will see her cardiologist soon for follow-up She would like to see an orthopedist closer home in [facility] about right knee pain.  I placed this referral for her This visit occurred during the SARS-CoV-2 public health emergency.  Safety protocols were in place, including screening questions prior to the visit, additional usage of staff PPE, and extensive cleaning of exam room while observing appropriate contact  time as indicated for disinfecting solutions.    Signed Lamar Blinks, MD   Received her labs 6/4- message to pt  Results for orders placed or performed in visit on 03/23/20  Hemoglobin A1c  Result Value Ref Range   Hgb A1c MFr Bld 7.0 (H) 4.6 - 6.5 %  CBC  Result Value Ref Range   WBC 7.1 4.0 - 10.5 K/uL   RBC 5.07 3.87 - 5.11 Mil/uL   Platelets 230.0 150.0 - 400.0 K/uL   Hemoglobin 15.2 (H) 12.0 - 15.0 g/dL   HCT 45.2 36.0 - 46.0 %   MCV 89.2 78.0 - 100.0 fl   MCHC 33.7 30.0 - 36.0 g/dL   RDW 13.4 11.5 - 15.5 %  Comprehensive metabolic panel  Result Value Ref Range   Sodium 138 135 - 145 mEq/L   Potassium 4.6 3.5 - 5.1 mEq/L   Chloride 101 96 - 112 mEq/L   CO2 27 19 - 32 mEq/L   Glucose, Bld 172 (H) 70 - 99 mg/dL   BUN 24 (H) 6 - 23 mg/dL   Creatinine, Ser 0.72 0.40 - 1.20 mg/dL   Total Bilirubin 0.5 0.2 - 1.2 mg/dL   Alkaline Phosphatase 78 39 - 117 U/L   AST 19 0 - 37 U/L   ALT 18 0 - 35 U/L   Total Protein 7.4 6.0 - 8.3 g/dL   Albumin 4.9 3.5 - 5.2 g/dL   GFR 79.38 >60.00 mL/min   Calcium 10.1 8.4 - 10.5 mg/dL  Lipid panel  Result Value Ref Range   Cholesterol 186 0 - 200 mg/dL   Triglycerides 387.0 (H) 0.0 - 149.0 mg/dL   HDL 49.50 >39.00 mg/dL   VLDL 77.4 (H) 0.0 - 40.0 mg/dL   Total CHOL/HDL Ratio 4    NonHDL 136.22   TSH  Result Value Ref Range   TSH 3.73 0.35 - 4.50 uIU/mL  LDL cholesterol, direct  Result Value Ref Range   Direct LDL 87.0 mg/dL

## 2020-03-24 ENCOUNTER — Other Ambulatory Visit: Payer: Self-pay | Admitting: Family Medicine

## 2020-03-24 LAB — COMPREHENSIVE METABOLIC PANEL
ALT: 18 U/L (ref 0–35)
AST: 19 U/L (ref 0–37)
Albumin: 4.9 g/dL (ref 3.5–5.2)
Alkaline Phosphatase: 78 U/L (ref 39–117)
BUN: 24 mg/dL — ABNORMAL HIGH (ref 6–23)
CO2: 27 mEq/L (ref 19–32)
Calcium: 10.1 mg/dL (ref 8.4–10.5)
Chloride: 101 mEq/L (ref 96–112)
Creatinine, Ser: 0.72 mg/dL (ref 0.40–1.20)
GFR: 79.38 mL/min (ref >60.00)
Glucose, Bld: 172 mg/dL — ABNORMAL HIGH (ref 70–99)
Potassium: 4.6 mEq/L (ref 3.5–5.1)
Sodium: 138 mEq/L (ref 135–145)
Total Bilirubin: 0.5 mg/dL (ref 0.2–1.2)
Total Protein: 7.4 g/dL (ref 6.0–8.3)

## 2020-03-24 LAB — CBC
HCT: 45.2 % (ref 36.0–46.0)
Hemoglobin: 15.2 g/dL — ABNORMAL HIGH (ref 12.0–15.0)
MCHC: 33.7 g/dL (ref 30.0–36.0)
MCV: 89.2 fl (ref 78.0–100.0)
Platelets: 230 10*3/uL (ref 150.0–400.0)
RBC: 5.07 Mil/uL (ref 3.87–5.11)
RDW: 13.4 % (ref 11.5–15.5)
WBC: 7.1 10*3/uL (ref 4.0–10.5)

## 2020-03-24 LAB — LDL CHOLESTEROL, DIRECT: Direct LDL: 87 mg/dL

## 2020-03-24 LAB — HEMOGLOBIN A1C: Hgb A1c MFr Bld: 7 % — ABNORMAL HIGH (ref 4.6–6.5)

## 2020-03-24 LAB — LIPID PANEL
Cholesterol: 186 mg/dL (ref 0–200)
HDL: 49.5 mg/dL (ref >39.00)
NonHDL: 136.22
Total CHOL/HDL Ratio: 4
Triglycerides: 387 mg/dL — ABNORMAL HIGH (ref 0.0–149.0)
VLDL: 77.4 mg/dL — ABNORMAL HIGH (ref 0.0–40.0)

## 2020-03-24 LAB — TSH: TSH: 3.73 u[IU]/mL (ref 0.35–4.50)

## 2020-03-25 ENCOUNTER — Encounter: Payer: Self-pay | Admitting: Family Medicine

## 2020-03-28 ENCOUNTER — Ambulatory Visit (HOSPITAL_COMMUNITY)
Admission: RE | Admit: 2020-03-28 | Discharge: 2020-03-28 | Disposition: A | Discharge status: Home / Self Care | Payer: Medicare Other | Source: Ambulatory Visit | Attending: Cardiovascular Disease | Admitting: Cardiovascular Disease

## 2020-03-28 ENCOUNTER — Ambulatory Visit (INDEPENDENT_AMBULATORY_CARE_PROVIDER_SITE_OTHER): Payer: Medicare Other | Admitting: Cardiovascular Disease

## 2020-03-28 ENCOUNTER — Encounter: Payer: Self-pay | Admitting: Cardiovascular Disease

## 2020-03-28 ENCOUNTER — Other Ambulatory Visit: Payer: Self-pay

## 2020-03-28 ENCOUNTER — Ambulatory Visit: Payer: Medicare Other | Admitting: Cardiovascular Disease

## 2020-03-28 VITALS — BP 148/70 | HR 90 | Ht 62.0 in | Wt 201.0 lb

## 2020-03-28 DIAGNOSIS — I2510 Atherosclerotic heart disease of native coronary artery without angina pectoris: Secondary | ICD-10-CM | POA: Diagnosis not present

## 2020-03-28 DIAGNOSIS — I5032 Chronic diastolic (congestive) heart failure: Secondary | ICD-10-CM | POA: Diagnosis not present

## 2020-03-28 DIAGNOSIS — I480 Paroxysmal atrial fibrillation: Secondary | ICD-10-CM | POA: Diagnosis not present

## 2020-03-28 DIAGNOSIS — I10 Essential (primary) hypertension: Secondary | ICD-10-CM | POA: Diagnosis not present

## 2020-03-28 DIAGNOSIS — I6523 Occlusion and stenosis of bilateral carotid arteries: Secondary | ICD-10-CM | POA: Insufficient documentation

## 2020-03-28 DIAGNOSIS — E785 Hyperlipidemia, unspecified: Secondary | ICD-10-CM | POA: Diagnosis not present

## 2020-03-28 MED ORDER — ROSUVASTATIN CALCIUM 20 MG PO TABS: 20.0000 mg | ORAL_TABLET | Freq: Every day | ORAL | 3 refills | Status: DC

## 2020-03-28 NOTE — Progress Notes (Signed)
Chief Complaint  Patient presents with  . Follow-up    CAD   History of Present Illness: 74 yo female with history of PAF, HTN, hyperlipidemia, anti-phospholipid antibody syndrome, GERD, asthma, CAD here today for cardiac follow up. Cardiac cath in 2003 with moderate CAD (30% ostial LM stenosis, 50% mid LAD, 80% moderate sized Diagonal, 80% Circumflex). She has had no cath since then. She has chronic back pain. She has a hypercoagulable state and is on Xarelto. Most recent stress test October 2013 with no ischemia, normal LV function.  Last echo December 2018 with normal LV systolic function, grade 2 diastolic dysfunction and trivial mitral regurgitation. Carotid artery dopplers June 2018 with stable 40-59% bilateral stenosis. She was admitted to [facility] August 2017 with sepsis, ascending cholangitis complicated by respiratory failure, renal failure, CHF.  She underwent lap cholecystectomy 10/04/16. When seen in our office in December 2018, her BP was elevated and PVCs were present. Metoprolol was increased to 100 mg BID.   She is here today for follow up. The patient denies any chest pain, dyspnea, palpitations, lower extremity edema, orthopnea, PND, dizziness, near syncope or syncope.    Primary Care Physician: Darreld Mclean, MD  Past Medical History:  Diagnosis Date  . Anemia, iron deficiency   . Antiphospholipid syndrome (HCC)    with hypercoagulable state  . Asthma    extrinsic; moderate, persistant, nml spirometry 2010, nml CXR 1/08  . Bladder troubles    REPORTS INFECTIONS ON OCCASION DUE TO URETHRA MEATUS STRICTURE AT BIRTH   . CAD (coronary artery disease)    a. 50% mid LAD, 80% ostial D1 and moderate 80% mid circ by cath 2003. b. nonischemic nuc in 2013.  Marland Kitchen Cancer ([place])    basal cell removed fr. L arm   . Carotid arterial disease ([place])    a. 03/2017 showed 1-39% bilateral internal carotid stenosis (high end range on left), >50% LECA stenosis, with recommendation for  f/u duplex 03/2018.  Marland Kitchen Complication of anesthesia    cardiac arrest- in OR, at age 21 (27)y.o. during Scalenotomy in her early 20's  . Drug allergy    heparin/lovenox  . DVT (deep venous thrombosis) ([place])   . Erosive gastritis 1994  . Family history of adverse reaction to anesthesia    some family members have had trouble waking up  . GERD (gastroesophageal reflux disease)   . H/O hiatal hernia   . HLD (hyperlipidemia)   . HTN (hypertension)    Pt states her high blood pressure readings are due tot he method of measurementsMcAlhaney at [employer], manages pt.  LOV 03/2014  . Hypothyroidism   . Liver spot    cyst- - no biopsy, but told that its benign   . Moderate mitral regurgitation   . PAF (paroxysmal atrial fibrillation) ([place])   . Pulmonary embolism ([place])    related to back surgery with prior coumadin use, now off  . PVC's (premature ventricular contractions)    a. noted in 09/2017.  Marland Kitchen Spondylosis, lumbosacral    ARTHRITIS- OA     Past Surgical History:  Procedure Laterality Date  . ABDOMINAL HYSTERECTOMY     partial abdominal -1998  . BACK SURGERY     x13 cervical and lumbar thoracic spine surgery  . BREAST ENHANCEMENT SURGERY    . BREAST SURGERY     all benign cysts x3   . CARDIAC CATHETERIZATION     2005  . CHOLECYSTECTOMY N/A 10/04/2016   Procedure: LAPAROSCOPIC  CHOLECYSTECTOMY;  Surgeon: Erroll Luna, MD;  Location: [facility] OR;  Service: General;  Laterality: N/A;  . cleft lip and palate repair     73 yo   . hysterectomy    . IR GENERIC HISTORICAL  06/04/2016   IR PERC CHOLECYSTOSTOMY 06/04/2016 Greggory Keen, MD [facility]INTER[facility]  . IR GENERIC HISTORICAL  08/06/2016   IR CHOLANGIOGRAM EXISTING TUBE 08/06/2016 Aletta Edouard, MD [facility]  . IR GENERIC HISTORICAL  07/17/2016   IR RADIOLOGIST EVAL & MGMT 07/17/2016 [facility] INTER[facility] RAD  . JOINT REPLACEMENT    . POSTERIOR CERVICAL FUSION/FORAMINOTOMY Right 08/31/2014   Procedure: Right cervical six-seven foraminotomy,  Cervical five-Thoracic one fusion, Cervical six-seven lateral mass screws;  Surgeon: Erline Levine, MD;  Location: [facility];  Service: Neurosurgery;  Laterality: Right;  . spina bifida repair     74 yo   . TONSILLECTOMY    . TOTAL KNEE ARTHROPLASTY     left    Current Outpatient Medications  Medication Sig Dispense Refill  . albuterol (PROVENTIL HFA;VENTOLIN HFA) 108 (90 Base) MCG/ACT inhaler Inhale 2 puffs into the lungs every 6 (six) hours as needed for wheezing or shortness of breath. 18 g 5  . azelastine (ASTELIN) 137 MCG/SPRAY nasal spray Place 1 spray into the nose 2 (two) times daily.     . Calcium Carb-Cholecalciferol (CALCIUM 500 +D) 500-400 MG-UNIT TABS Take 1 tablet by mouth 2 (two) times daily.    . cyclobenzaprine (FLEXERIL) 10 MG tablet Take 10 mg by mouth 3 (three) times daily as needed for muscle spasms.    Marland Kitchen docusate sodium (COLACE) 100 MG capsule Take 200 mg by mouth 2 (two) times daily.     Marland Kitchen EPINEPHrine 0.3 mg/0.3 mL IJ SOAJ injection Inject 0.3 mg into the muscle once. As needed for anaphylaxis    . fluticasone (FLOVENT HFA) 110 MCG/ACT inhaler Inhale 1-2 puffs into the lungs 2 (two) times daily. 12 g 12  . furosemide (LASIX) 40 MG tablet Take 1 tablet (40 mg total) by mouth daily. 90 tablet 3  . gabapentin (NEURONTIN) 600 MG tablet Take 600-750 mg by mouth at bedtime.     . Glucosamine-Chondroitin 750-600 MG TABS Take 2 tablets by mouth 2 (two) times daily.     Marland Kitchen HYDROmorphone (DILAUDID) 4 MG tablet Take 4 mg by mouth daily.     Marland Kitchen levothyroxine (SYNTHROID) 75 MCG tablet TAKE 1 TABLET BEFORE BREAKFAST. 90 tablet 1  . loratadine-pseudoephedrine (CLARITIN-D 12 HOUR) 5-120 MG tablet Take 1 tablet by mouth 2 (two) times daily. 30 tablet 5  . Melatonin 5 MG TABS Take 2.5-5 mg by mouth at bedtime as needed.    . metoprolol tartrate (LOPRESSOR) 100 MG tablet Take 1 tablet (100 mg total) by mouth 2 (two) times daily. 180 tablet 3  . montelukast (SINGULAIR) 10 MG tablet TAKE 1  TABLET ONCE DAILY IN THE EVENING. 90 tablet 3  . Multiple Vitamin (MULTIVITAMIN WITH MINERALS) TABS Take 1 tablet by mouth daily.    . nitroGLYCERIN (NITROSTAT) 0.4 MG SL tablet DISSOLVE 1 TABLET UNDER TONGUE AS NEEDED FOR CHEST PAIN,MAY REPEAT IN5 MINUTES FOR 2 DOSES. 25 tablet 5  . nystatin (MYCOSTATIN/NYSTOP) powder Apply topically 3 (three) times daily. Use as needed for yeast 60 g 3  . potassium chloride (KLOR-CON) 10 MEQ tablet TAKE 4 TABLETS TWICE DAILY. 240 tablet 6  . Respiratory Therapy Supplies (FLUTTER) DEVI Use as directed 1 each 0  . rivaroxaban (XARELTO) 20 MG TABS tablet Take 1  tablet (20 mg total) by mouth daily with supper. 90 tablet 1  . triamcinolone (NASACORT) 55 MCG/ACT nasal inhaler Place 1-2 sprays into the nose at bedtime.     . triamcinolone lotion (KENALOG) 0.1 %     . rosuvastatin (CRESTOR) 20 MG tablet Take 1 tablet (20 mg total) by mouth daily. 90 tablet 3   No current facility-administered medications for this visit.    Allergies  Allergen Reactions  . Chlorhexidine Anaphylaxis and Itching    Sensitive to dye in Betadine & Chlorohexadine   . Enoxaparin Sodium Anaphylaxis  . Heparin Anaphylaxis  . Hornet Venom Anaphylaxis    European Hornets  . Pork-Derived Products Anaphylaxis  . Povidone-Iodine Itching and Rash    NOTE: Patient has had anaphylactic reaction to Betadine due to dyes in product. Sensitivity-" but if its wiped off she is able to tolerate betadine"   . Indomethacin Rash  . Lactose Intolerance (Gi) Diarrhea and Nausea And Vomiting    Can tolerate most [place] cheeses (Takes Lactaid)  . Phenylpropanolamine Hypertension    Social History   Socioeconomic History  . Marital status: Married    Spouse name: Not on file  . Number of children: Not on file  . Years of education: Not on file  . Highest education level: Not on file  Occupational History  . Not on file  Tobacco Use  . Smoking status: Never Smoker  . Smokeless tobacco: Never  Used  . Tobacco comment: no smoking   Substance and Sexual Activity  . Alcohol use: No  . Drug use: No  . Sexual activity: Not Currently  Other Topics Concern  . Not on file  Social History Narrative   Lives in [place] with husband; has had miscarriages but no children.    Disabled but exercises nearly every day (can only exericse in water - aerobics)   Takes opioids for pain relief; takes no herbal medications; has a heart healthy diet.    Social Determinants of Health   Financial Resource Strain:   . Difficulty of Paying Living Expenses:   Food Insecurity:   . Worried About Charity fundraiser in the Last Year:   . Arboriculturist in the Last Year:   Transportation Needs:   . Film/video editor (Medical):   Marland Kitchen Lack of Transportation (Non-Medical):   Physical Activity:   . Days of Exercise per Week:   . Minutes of Exercise per Session:   Stress:   . Feeling of Stress :   Social Connections:   . Frequency of Communication with Friends and Family:   . Frequency of Social Gatherings with Friends and Family:   . Attends Religious Services:   . Active Member of Clubs or Organizations:   . Attends Archivist Meetings:   Marland Kitchen Marital Status:   Intimate Partner Violence:   . Fear of Current or Ex-Partner:   . Emotionally Abused:   Marland Kitchen Physically Abused:   . Sexually Abused:     Family History  Problem Relation Age of Onset  . Heart disease Father        MI   . Heart disease Sister     Review of Systems:  As stated in the HPI and otherwise negative.   BP (!) 148/70   Pulse 90   Ht 5\' 2"  (1.575 m)   Wt 201 lb (91.2 kg)   SpO2 98%   BMI 36.76 kg/m   Physical Examination:  General: Well developed,  well nourished, NAD  HEENT: OP clear, mucus membranes moist  SKIN: warm, dry. No rashes. Neuro: No focal deficits  Musculoskeletal: Muscle strength 5/5 all ext  Psychiatric: Mood and affect normal  Neck: No JVD, no carotid bruits, no thyromegaly, no  lymphadenopathy.  Lungs:Clear bilaterally, no wheezes, rhonci, crackles Cardiovascular: Regular rate and rhythm. No murmurs, gallops or rubs. Abdomen:Soft. Bowel sounds present. Non-tender.  Extremities: No lower extremity edema. Pulses are 2 + in the bilateral DP/PT.  Echo December 2018:  - Left ventricle: The cavity size was normal. Wall thickness was   normal. Systolic function was normal. The estimated ejection   fraction was in the range of 50% to 55%. Wall motion was normal;   there were no regional wall motion abnormalities. Features are   consistent with a pseudonormal left ventricular filling pattern,   with concomitant abnormal relaxation and increased filling   pressure (grade 2 diastolic dysfunction).  Cardiac cath 09/22/02: 1. Ventriculography was performed in the RAO projection. Overall ejection  fraction was 70%. No segmental wall motion abnormalities were  identified.  2. The left main coronary artery has, what appears to be, about 30%  narrowing at the ostium. In most views there does appear to be a patent  ostium; however, some tapered narrowing cannot be excluded.  3. The LAD proper has about 40-50% narrowing, at most, in the mid portion  beyond the diagonal takeoff. The vessel is calcified. The diagonal  takeoff itself has a long 80% stenosis at the ostium extending out into  the mid portion of the diagonal. The distal diagonal does appear to be  suitable for grafting.  4. The large circumflex has about 70-80% focal narrowing prior to the  bifurcation of the marginal.  5. The right coronary artery has some mild luminal irregularities but no  high-grade stenoses.  IMPRESSION:  1. Coronary artery disease with significant involvement and circumflex  involvement.  2. Multiple medical issues, as described above.  EKG:  EKG is not ordered today. The ekg ordered today demonstrates   Recent Labs: 03/23/2020: ALT 18; BUN 24; Creatinine, Ser 0.72; Hemoglobin 15.2;  Platelets 230.0; Potassium 4.6; Sodium 138; TSH 3.73   Lipid Panel Lipid Panel     Component Value Date/Time   CHOL 186 03/23/2020 1544   TRIG 387.0 (H) 03/23/2020 1544   HDL 49.50 03/23/2020 1544   CHOLHDL 4 03/23/2020 1544   VLDL 77.4 (H) 03/23/2020 1544   LDLDIRECT 87.0 03/23/2020 1544     Wt Readings from Last 3 Encounters:  03/28/20 201 lb (91.2 kg)  03/23/20 202 lb (91.6 kg)  10/22/19 177 lb 12.8 oz (80.6 kg)     Other studies Reviewed: Additional studies/ records that were reviewed today include: . Review of the above records demonstrates:    Assessment and Plan:   1. CAD without angina: She is known to have moderate CAD by cath in 2003. LV function normal by echo in December 2018. She has no chest pain. Will continue beta blocker and statin. No ASA due to easy bruising while on Xarelto as well.    2. Atrial fib, paroxysmal: She appears to be in sinus today. Will continue Xarelto and beta blocker.      3. HYPERTENSION: BP is controlled at home.   4. CAROTID ARTERY DISEASE: Mild bilateral carotid disease by dopplers today.    5. Hyperlipidemia: Lipids followed in primary care per pt. LDL near goal. Continue statin but will change Lipitor to Crestor to 20 mg  daily.   6. Chronic diastolic CHF: No volume overload. She admits to weight gain due to immobility. Continue Lasix  Current medicines are reviewed at length with the patient today.  The patient does not have concerns regarding medicines.  The following changes have been made:  no change  Labs/ tests ordered today include:   No orders of the defined types were placed in this encounter.  Disposition:   FU with me in 12  months  Signed, Lauree Chandler, MD 03/28/2020 4:43 PM    [facility] [address], [place], [place]  [address] Phone: [phone]; Fax: [phone]

## 2020-03-28 NOTE — Patient Instructions (Signed)
Medication Instructions:  Your physician has recommended you make the following change in your medication:  1.) stop atorvastatin (Lipitor) 2.) start rosuvastatin (Crestor) 20 mg daily  *If you need a refill on your cardiac medications before your next appointment, please call your pharmacy*   Lab Work: None  Testing/Procedures: none   Follow-Up: At [facility], you and your health needs are our priority.  As part of our continuing mission to provide you with exceptional heart care, we have created designated Provider Care Teams.  These Care Teams include your primary Cardiologist (physician) and Advanced Practice Providers (APPs -  Physician Assistants and Nurse Practitioners) who all work together to provide you with the care you need, when you need it.  Your next appointment:   6 month(s)  The format for your next appointment:   In Person  Provider:   You may see Lauree Chandler, MD or one of the following Advanced Practice Providers on your designated Care Team:    Melina Copa, PA-C  Ermalinda Barrios, PA-C    Other Instructions

## 2020-03-30 ENCOUNTER — Encounter: Payer: Self-pay | Admitting: Family Medicine

## 2020-04-07 DIAGNOSIS — M4722 Other spondylosis with radiculopathy, cervical region: Secondary | ICD-10-CM | POA: Diagnosis not present

## 2020-04-07 DIAGNOSIS — G894 Chronic pain syndrome: Secondary | ICD-10-CM | POA: Diagnosis not present

## 2020-04-07 DIAGNOSIS — Z79891 Long term (current) use of opiate analgesic: Secondary | ICD-10-CM | POA: Diagnosis not present

## 2020-04-07 DIAGNOSIS — M963 Postlaminectomy kyphosis: Secondary | ICD-10-CM | POA: Diagnosis not present

## 2020-05-19 DIAGNOSIS — G894 Chronic pain syndrome: Secondary | ICD-10-CM | POA: Diagnosis not present

## 2020-05-19 DIAGNOSIS — M963 Postlaminectomy kyphosis: Secondary | ICD-10-CM | POA: Diagnosis not present

## 2020-05-19 DIAGNOSIS — Z79891 Long term (current) use of opiate analgesic: Secondary | ICD-10-CM | POA: Diagnosis not present

## 2020-05-19 DIAGNOSIS — M4722 Other spondylosis with radiculopathy, cervical region: Secondary | ICD-10-CM | POA: Diagnosis not present

## 2020-05-22 HISTORY — PX: MOHS SURGERY: SUR867

## 2020-05-27 DIAGNOSIS — C44319 Basal cell carcinoma of skin of other parts of face: Secondary | ICD-10-CM | POA: Diagnosis not present

## 2020-06-02 ENCOUNTER — Other Ambulatory Visit: Payer: Self-pay | Admitting: Family Medicine

## 2020-06-02 DIAGNOSIS — E039 Hypothyroidism, unspecified: Secondary | ICD-10-CM

## 2020-06-12 ENCOUNTER — Other Ambulatory Visit: Payer: Self-pay | Admitting: Family Medicine

## 2020-06-12 DIAGNOSIS — B372 Candidiasis of skin and nail: Secondary | ICD-10-CM

## 2020-06-22 NOTE — Progress Notes (Signed)
I connected with Latoya [name] today by telephone and verified that I am speaking with the correct person using two identifiers. Location patient: home Location provider: work Persons participating in the virtual visit: patient, Marine scientist.    I discussed the limitations, risks, security and privacy concerns of performing an evaluation and management service by telephone and the availability of in person appointments. I also discussed with the patient that there may be a patient responsible charge related to this service. The patient expressed understanding and verbally consented to this telephonic visit.    Interactive audio and video telecommunications were attempted between this provider and patient, however failed, due to patient having technical difficulties OR patient did not have access to video capability.  We continued and completed visit with audio only.  Some vital signs may be absent or patient reported.    Subjective:   Latoya [name] is a 74 y.o. female who presents for Medicare Annual (Subsequent) preventive examination.  Review of Systems    Cardiac Risk Factors include: advanced age (>76men, >20 women);diabetes mellitus;dyslipidemia;hypertension     Objective:     Advanced Directives 06/23/2020 06/22/2019 12/04/2018 06/16/2018 01/28/2017 09/26/2016 08/03/2016  Does Patient Have a Medical Advance Directive? Yes Yes No Yes Yes No No  Type of Paramedic of [place];Living will [place];Living will - Living will;Healthcare Power of [place];Living will - -  Does patient want to make changes to medical advance directive? No - Patient declined No - Patient declined - No - Patient declined Yes (MAU/Ambulatory/Procedural Areas - Information given) - -  Copy of [place] in Chart? No - copy requested Yes - validated most recent copy scanned in chart (See row information) - No - copy requested No - copy requested  - -  Would patient like information on creating a medical advance directive? - - Yes (ED - Information included in AVS) - - No - Patient declined -  Pre-existing out of facility DNR order (yellow form or pink MOST form) - - - - - - -    Current Medications (verified) Outpatient Encounter Medications as of 06/23/2020  Medication Sig  . albuterol (PROVENTIL HFA;VENTOLIN HFA) 108 (90 Base) MCG/ACT inhaler Inhale 2 puffs into the lungs every 6 (six) hours as needed for wheezing or shortness of breath.  Marland Kitchen azelastine (ASTELIN) 137 MCG/SPRAY nasal spray Place 1 spray into the nose 2 (two) times daily.   . Calcium Carb-Cholecalciferol (CALCIUM 500 +D) 500-400 MG-UNIT TABS Take 1 tablet by mouth 2 (two) times daily.  . cyclobenzaprine (FLEXERIL) 10 MG tablet Take 10 mg by mouth 3 (three) times daily as needed for muscle spasms.  Marland Kitchen docusate sodium (COLACE) 100 MG capsule Take 200 mg by mouth 2 (two) times daily.   . fluticasone (FLOVENT HFA) 110 MCG/ACT inhaler Inhale 1-2 puffs into the lungs 2 (two) times daily.  . furosemide (LASIX) 40 MG tablet Take 1 tablet (40 mg total) by mouth daily.  Marland Kitchen gabapentin (NEURONTIN) 600 MG tablet Take 600-750 mg by mouth at bedtime.   . Glucosamine-Chondroitin 750-600 MG TABS Take 2 tablets by mouth 2 (two) times daily.   Marland Kitchen HYDROmorphone (DILAUDID) 4 MG tablet Take 4 mg by mouth daily.   Marland Kitchen levothyroxine (SYNTHROID) 75 MCG tablet TAKE 1 TABLET ONCE DAILY ONE HOUR BEFORE BREAKFAST.  Marland Kitchen loratadine-pseudoephedrine (CLARITIN-D 12 HOUR) 5-120 MG tablet Take 1 tablet by mouth 2 (two) times daily.  . Melatonin 5 MG TABS Take 2.5-5  mg by mouth at bedtime as needed.  . metoprolol tartrate (LOPRESSOR) 100 MG tablet Take 1 tablet (100 mg total) by mouth 2 (two) times daily.  . montelukast (SINGULAIR) 10 MG tablet TAKE 1 TABLET ONCE DAILY IN THE EVENING.  . Multiple Vitamin (MULTIVITAMIN WITH MINERALS) TABS Take 1 tablet by mouth daily.  Marland Kitchen nystatin (MYCOSTATIN/NYSTOP) powder APPLY  TOPICALLY 3 TIMES DAILY. USE AS NEEDED FOR YEAST.  Marland Kitchen potassium chloride (KLOR-CON) 10 MEQ tablet TAKE 4 TABLETS TWICE DAILY.  Marland Kitchen Respiratory Therapy Supplies (FLUTTER) DEVI Use as directed  . rivaroxaban (XARELTO) 20 MG TABS tablet Take 1 tablet (20 mg total) by mouth daily with supper.  . rosuvastatin (CRESTOR) 20 MG tablet Take 1 tablet (20 mg total) by mouth daily.  Marland Kitchen triamcinolone (NASACORT) 55 MCG/ACT nasal inhaler Place 1-2 sprays into the nose at bedtime.   . triamcinolone lotion (KENALOG) 0.1 %   . EPINEPHrine 0.3 mg/0.3 mL IJ SOAJ injection Inject 0.3 mg into the muscle once. As needed for anaphylaxis (Patient not taking: Reported on 06/23/2020)  . nitroGLYCERIN (NITROSTAT) 0.4 MG SL tablet DISSOLVE 1 TABLET UNDER TONGUE AS NEEDED FOR CHEST PAIN,MAY REPEAT IN5 MINUTES FOR 2 DOSES. (Patient not taking: Reported on 06/23/2020)   No facility-administered encounter medications on file as of 06/23/2020.    Allergies (verified) Chlorhexidine, Enoxaparin sodium, Heparin, Hornet venom, Pork-derived products, Povidone-iodine, Indomethacin, Lactose intolerance (gi), and Phenylpropanolamine   History: Past Medical History:  Diagnosis Date  . Anemia, iron deficiency   . Antiphospholipid syndrome (HCC)    with hypercoagulable state  . Asthma    extrinsic; moderate, persistant, nml spirometry 2010, nml CXR 1/08  . Bladder troubles    REPORTS INFECTIONS ON OCCASION DUE TO URETHRA MEATUS STRICTURE AT BIRTH   . CAD (coronary artery disease)    a. 50% mid LAD, 80% ostial D1 and moderate 80% mid circ by cath 2003. b. nonischemic nuc in 2013.  Marland Kitchen Cancer ([place])    basal cell removed fr. L arm   . Carotid arterial disease ([place])    a. 03/2017 showed 1-39% bilateral internal carotid stenosis (high end range on left), >50% LECA stenosis, with recommendation for f/u duplex 03/2018.  Marland Kitchen Complication of anesthesia    cardiac arrest- in OR, at age 74 (77)y.o. during Scalenotomy in her early 20's  . Drug allergy     heparin/lovenox  . DVT (deep venous thrombosis) ([place])   . Erosive gastritis 1994  . Family history of adverse reaction to anesthesia    some family members have had trouble waking up  . GERD (gastroesophageal reflux disease)   . H/O hiatal hernia   . HLD (hyperlipidemia)   . HTN (hypertension)    Pt states her high blood pressure readings are due tot he method of measurementsMc[facility]haney at [facility], manages pt.  LOV 03/2014  . Hypothyroidism   . Liver spot    cyst- - no biopsy, but told that its benign   . Moderate mitral regurgitation   . PAF (paroxysmal atrial fibrillation) ([place])   . Pulmonary embolism ([place])    related to back surgery with prior coumadin use, now off  . PVC's (premature ventricular contractions)    a. noted in 09/2017.  Marland Kitchen Spondylosis, lumbosacral    ARTHRITIS- OA    Past Surgical History:  Procedure Laterality Date  . ABDOMINAL HYSTERECTOMY     partial abdominal -1998  . BACK SURGERY     x13 cervical and lumbar thoracic spine surgery  . BREAST  ENHANCEMENT SURGERY    . BREAST SURGERY     all benign cysts x3   . CARDIAC CATHETERIZATION     2005  . CHOLECYSTECTOMY N/A 10/04/2016   Procedure: LAPAROSCOPIC CHOLECYSTECTOMY;  Surgeon: Erroll Luna, MD;  Location: [facility] OR;  Service: General;  Laterality: N/A;  . cleft lip and palate repair     74 yo   . hysterectomy    . IR GENERIC HISTORICAL  06/04/2016   IR PERC CHOLECYSTOSTOMY 06/04/2016 Greggory Keen, MD [facility]INTER[facility]  . IR GENERIC HISTORICAL  08/06/2016   IR CHOLANGIOGRAM EXISTING TUBE 08/06/2016 Aletta Edouard, MD [facility]  . IR GENERIC HISTORICAL  07/17/2016   IR RADIOLOGIST EVAL & MGMT 07/17/2016 [facility]  . JOINT REPLACEMENT    . MOHS SURGERY  05/22/2020   right side forehead  . POSTERIOR CERVICAL FUSION/FORAMINOTOMY Right 08/31/2014   Procedure: Right cervical six-seven foraminotomy, Cervical five-Thoracic one fusion, Cervical six-seven lateral mass screws;  Surgeon: Erline Levine, MD;   Location: [facility] OR[facility];  Service: Neurosurgery;  Laterality: Right;  . spina bifida repair     74 yo   . TONSILLECTOMY    . TOTAL KNEE ARTHROPLASTY     left   Family History  Problem Relation Age of Onset  . Heart disease Father        MI   . Heart disease Sister    Social History   Socioeconomic History  . Marital status: Married    Spouse name: Not on file  . Number of children: Not on file  . Years of education: Not on file  . Highest education level: Not on file  Occupational History  . Not on file  Tobacco Use  . Smoking status: Never Smoker  . Smokeless tobacco: Never Used  . Tobacco comment: no smoking   Vaping Use  . Vaping Use: Never used  Substance and Sexual Activity  . Alcohol use: No  . Drug use: No  . Sexual activity: Not Currently  Other Topics Concern  . Not on file  Social History Narrative   Lives in [place] with husband; has had miscarriages but no children.    Disabled but exercises nearly every day (can only exericse in water - aerobics)   Takes opioids for pain relief; takes no herbal medications; has a heart healthy diet.    Social Determinants of Health   Financial Resource Strain: Low Risk   . Difficulty of Paying Living Expenses: Not hard at all  Food Insecurity: No Food Insecurity  . Worried About Charity fundraiser in the Last Year: Never true  . Ran Out of Food in the Last Year: Never true  Transportation Needs: No Transportation Needs  . Lack of Transportation (Medical): No  . Lack of Transportation (Non-Medical): No  Physical Activity:   . Days of Exercise per Week: Not on file  . Minutes of Exercise per Session: Not on file  Stress:   . Feeling of Stress : Not on file  Social Connections:   . Frequency of Communication with Friends and Family: Not on file  . Frequency of Social Gatherings with Friends and Family: Not on file  . Attends Religious Services: Not on file  . Active Member of Clubs or Organizations: Not on file  .  Attends Archivist Meetings: Not on file  . Marital Status: Not on file    Tobacco Counseling Counseling given: Not Answered Comment: no smoking    Clinical Intake: Pain :  No/denies pain    Activities of Daily Living In your present state of health, do you have any difficulty performing the following activities: 06/23/2020  Hearing? N  Vision? N  Difficulty concentrating or making decisions? N  Walking or climbing stairs? Y  Dressing or bathing? N  Doing errands, shopping? N  Preparing Food and eating ? N  Using the Toilet? N  In the past six months, have you accidently leaked urine? N  Do you have problems with loss of bowel control? N  Managing your Medications? N  Managing your Finances? N  Housekeeping or managing your Housekeeping? N  Some recent data might be hidden    Patient Care Team: Copland, Gay Filler, MD as PCP - General (Family Medicine) Burnell Blanks, MD as PCP - Cardiology (Cardiology) Nicholaus Bloom, MD as Consulting Physician (Anesthesiology) Erline Levine, MD as Consulting Physician (Neurosurgery) Mosetta Anis, MD as Consulting Physician (Allergy) Tanda Rockers, MD as Consulting Physician (Pulmonary Disease) Burnell Blanks, MD as Consulting Physician (Cardiology) Macarthur Critchley, [place] as Consulting Physician (Optometry) Druscilla Brownie, MD as Consulting Physician (Dermatology) Dionne Milo, Rockney Ghee, MD as Consulting Physician (Hematology and Oncology)  Indicate any recent Medical Services you may have received from other than [facility] providers in the past year (date may be approximate).     Assessment:   This is a routine wellness examination for Latoya [name].  Dietary issues and exercise activities discussed: Current Exercise Habits: The patient does not participate in regular exercise at present, Exercise limited by: None identified Diet (meal preparation, eat out, water intake, caffeinated beverages, dairy products, fruits and  vegetables): well balanced   Goals    . Increase physical activity     Return to water aerobics tolerated.    . Weight (lb) < 200 lb (90.7 kg)     Goal weight 175lb      Depression Screen PHQ 2/9 Scores 06/23/2020 06/22/2019 06/16/2018 04/16/2018 01/28/2017  PHQ - 2 Score 0 0 0 0 0    Fall Risk Fall Risk  06/23/2020 06/22/2019 06/16/2018 01/28/2017  Falls in the past year? 0 0 No No  Number falls in past yr: 0 - - -  Injury with Fall? 0 - - -  Follow up Education provided;Falls prevention discussed - - -    Any stairs in or around the home? No  If so, are there any without handrails? No  Home free of loose throw rugs in walkways, pet beds, electrical cords, etc? Yes  Adequate lighting in your home to reduce risk of falls? Yes   ASSISTIVE DEVICES UTILIZED TO PREVENT FALLS:  Life alert? No  Use of a cane, walker or w/c? Yes  Grab bars in the bathroom? Yes  Shower chair or bench in shower? Yes  Elevated toilet seat or a handicapped toilet? Yes   Cognitive Function: Ad8 score reviewed for issues:  Issues making decisions:no  Less interest in hobbies / activities:no  Repeats questions, stories (family complaining):no  Trouble using ordinary gadgets (microwave, computer, phone):no  Forgets the month or year: no  Mismanaging finances: no  Remembering appts:no  Daily problems with thinking and/or memory:no Ad8 score is=0   MMSE - Mini Mental State Exam 06/16/2018 01/28/2017  Orientation to time 5 5  Orientation to Place 5 5  Registration 3 3  Attention/ Calculation 5 5  Recall 3 3  Language- name 2 objects 2 2  Language- repeat 1 1  Language- follow 3 step command 3 3  Language- read &  follow direction 1 1  Write a sentence 1 1  Copy design 1 1  Total score 30 30        Immunizations Immunization History  Administered Date(s) Administered  . Influenza Split 07/22/2016  . Influenza, High Dose Seasonal PF 07/23/2019  . Influenza-Unspecified 07/22/2013, 06/22/2014,  08/22/2017, 07/17/2018  . PFIZER SARS-COV-2 Vaccination 12/05/2019, 12/28/2019  . Pneumococcal Conjugate-13 08/09/2014  . Pneumococcal-Unspecified 07/22/2013  . Tdap 01/28/2017    TDAP status: Up to date Flu Vaccine status: Up to date Pneumococcal vaccine status: Up to date Covid-19 vaccine status: Completed vaccines   Screening Tests Health Maintenance  Topic Date Due  . INFLUENZA VACCINE  05/22/2020  . OPHTHALMOLOGY EXAM  08/12/2020  . HEMOGLOBIN A1C  09/22/2020  . URINE MICROALBUMIN  10/07/2020  . COLONOSCOPY  01/20/2021  . FOOT EXAM  03/23/2021  . MAMMOGRAM  09/10/2021  . TETANUS/TDAP  01/29/2027  . DEXA SCAN  Completed  . COVID-19 Vaccine  Completed  . Hepatitis C Screening  Completed  . PNA vac Low Risk Adult  Completed    Health Maintenance  Health Maintenance Due  Topic Date Due  . INFLUENZA VACCINE  05/22/2020     Mammogram status: Completed 09/11/19. Repeat every year Bone Density status: Completed 08/15/18. Results reflect: Bone density results: NORMAL. Repeat every 2 years.  Lung Cancer Screening: (Low Dose CT Chest recommended if Age 47-80 years, 30 pack-year currently smoking OR have quit w/in 15years.) does not qualify.   Lung Cancer Screening Referral: na   Additional Screening:  Hepatitis C Screening: does qualify; Completed 11/21/16   Vision Screening: Recommended annual ophthalmology exams for early detection of glaucoma and other disorders of the eye. Is the patient up to date with their annual eye exam?  Yes  Who is the provider or what is the name of the office in which the patient attends annual eye exams? [facility]  Dental Screening: Recommended annual dental exams for proper oral hygiene  Community Resource Referral / Chronic Care Management: CRR required this visit?  No   CCM required this visit?  No      Plan:    Continue to eat heart healthy diet (full of fruits, vegetables, whole grains, lean protein, water--limit salt,  fat, and sugar intake) and increase physical activity as tolerated.  Continue doing brain stimulating activities (puzzles, reading, adult coloring books, staying active) to keep memory sharp.    I have personally reviewed and noted the following in the patient's chart:   . Medical and social history . Use of alcohol, tobacco or illicit drugs  . Current medications and supplements . Functional ability and status . Nutritional status . Physical activity . Advanced directives . List of other physicians . Hospitalizations, surgeries, and ER visits in previous 12 months . Vitals . Screenings to include cognitive, depression, and falls . Referrals and appointments  In addition, I have reviewed and discussed with patient certain preventive protocols, quality metrics, and best practice recommendations. A written personalized care plan for preventive services as well as general preventive health recommendations were provided to patient.    Due to this being a telephonic visit, the after visit summary with patients personalized plan was offered to patient via mail or my-chart.  Patient would like to access on my-chart.  Naaman Plummer [place], [place]   06/23/2020

## 2020-06-23 ENCOUNTER — Other Ambulatory Visit: Payer: Self-pay

## 2020-06-23 ENCOUNTER — Ambulatory Visit (INDEPENDENT_AMBULATORY_CARE_PROVIDER_SITE_OTHER): Payer: Medicare Other | Admitting: *Deleted

## 2020-06-23 ENCOUNTER — Encounter: Payer: Self-pay | Admitting: *Deleted

## 2020-06-23 DIAGNOSIS — Z0000 Encounter for general adult medical examination without abnormal findings: Secondary | ICD-10-CM

## 2020-06-23 NOTE — Patient Instructions (Signed)
Continue to eat heart healthy diet (full of fruits, vegetables, whole grains, lean protein, water--limit salt, fat, and sugar intake) and increase physical activity as tolerated.  Continue doing brain stimulating activities (puzzles, reading, adult coloring books, staying active) to keep memory sharp.    Latoya [name] , Thank you for taking time to come for your Medicare Wellness Visit. I appreciate your ongoing commitment to your health goals. Please review the following plan we discussed and let me know if I can assist you in the future.   These are the goals we discussed: Goals    . Increase physical activity     Return to water aerobics tolerated.    . Weight (lb) < 200 lb (90.7 kg)     Goal weight 175lb       This is a list of the screening recommended for you and due dates:  Health Maintenance  Topic Date Due  . Flu Shot  05/22/2020  . Eye exam for diabetics  08/12/2020  . Hemoglobin A1C  09/22/2020  . Urine Protein Check  10/07/2020  . Colon Cancer Screening  01/20/2021  . Complete foot exam   03/23/2021  . Mammogram  09/10/2021  . Tetanus Vaccine  01/29/2027  . DEXA scan (bone density measurement)  Completed  . COVID-19 Vaccine  Completed  .  Hepatitis C: One time screening is recommended by Center for Disease Control  (CDC) for  adults born from [birth year] through 1964.   Completed  . Pneumonia vaccines  Completed    Preventive Care 60 Years and Older, Female Preventive care refers to lifestyle choices and visits with your health care provider that can promote health and wellness. This includes:  A yearly physical exam. This is also called an annual well check.  Regular dental and eye exams.  Immunizations.  Screening for certain conditions.  Healthy lifestyle choices, such as diet and exercise. What can I expect for my preventive care visit? Physical exam Your health care provider will check:  Height and weight. These may be used to calculate body mass index (BMI),  which is a measurement that tells if you are at a healthy weight.  Heart rate and blood pressure.  Your skin for abnormal spots. Counseling Your health care provider may ask you questions about:  Alcohol, tobacco, and drug use.  Emotional well-being.  Home and relationship well-being.  Sexual activity.  Eating habits.  History of falls.  Memory and ability to understand (cognition).  Work and work Statistician.  Pregnancy and menstrual history. What immunizations do I need?  Influenza (flu) vaccine  This is recommended every year. Tetanus, diphtheria, and pertussis (Tdap) vaccine  You may need a Td booster every 10 years. Varicella (chickenpox) vaccine  You may need this vaccine if you have not already been vaccinated. Zoster (shingles) vaccine  You may need this after age 72. Pneumococcal conjugate (PCV13) vaccine  One dose is recommended after age 16. Pneumococcal polysaccharide (PPSV23) vaccine  One dose is recommended after age 11. Measles, mumps, and rubella (MMR) vaccine  You may need at least one dose of MMR if you were born in 1957 or later. You may also need a second dose. Meningococcal conjugate (MenACWY) vaccine  You may need this if you have certain conditions. Hepatitis A vaccine  You may need this if you have certain conditions or if you travel or work in places where you may be exposed to hepatitis A. Hepatitis B vaccine  You may need this if you  have certain conditions or if you travel or work in places where you may be exposed to hepatitis B. Haemophilus influenzae type b (Hib) vaccine  You may need this if you have certain conditions. You may receive vaccines as individual doses or as more than one vaccine together in one shot (combination vaccines). Talk with your health care provider about the risks and benefits of combination vaccines. What tests do I need? Blood tests  Lipid and cholesterol levels. These may be checked every 5  years, or more frequently depending on your overall health.  Hepatitis C test.  Hepatitis B test. Screening  Lung cancer screening. You may have this screening every year starting at age 34 if you have a 30-pack-year history of smoking and currently smoke or have quit within the past 15 years.  Colorectal cancer screening. All adults should have this screening starting at age 27 and continuing until age 34. Your health care provider may recommend screening at age 92 if you are at increased risk. You will have tests every 1-10 years, depending on your results and the type of screening test.  Diabetes screening. This is done by checking your blood sugar (glucose) after you have not eaten for a while (fasting). You may have this done every 1-3 years.  Mammogram. This may be done every 1-2 years. Talk with your health care provider about how often you should have regular mammograms.  BRCA-related cancer screening. This may be done if you have a family history of breast, ovarian, tubal, or peritoneal cancers. Other tests  Sexually transmitted disease (STD) testing.  Bone density scan. This is done to screen for osteoporosis. You may have this done starting at age 54. Follow these instructions at home: Eating and drinking  Eat a diet that includes fresh fruits and vegetables, whole grains, lean protein, and low-fat dairy products. Limit your intake of foods with high amounts of sugar, saturated fats, and salt.  Take vitamin and mineral supplements as recommended by your health care provider.  Do not drink alcohol if your health care provider tells you not to drink.  If you drink alcohol: ? Limit how much you have to 0-1 drink a day. ? Be aware of how much alcohol is in your drink. In the [place] one drink equals one 12 oz bottle of beer (355 mL), one 5 oz glass of wine (148 mL), or one 1 oz glass of hard liquor (44 mL). Lifestyle  Take daily care of your teeth and gums.  Stay active.  Exercise for at least 30 minutes on 5 or more days each week.  Do not use any products that contain nicotine or tobacco, such as cigarettes, e-cigarettes, and chewing tobacco. If you need help quitting, ask your health care provider.  If you are sexually active, practice safe sex. Use a condom or other form of protection in order to prevent STIs (sexually transmitted infections).  Talk with your health care provider about taking a low-dose aspirin or statin. What's next?  Go to your health care provider once a year for a well check visit.  Ask your health care provider how often you should have your eyes and teeth checked.  Stay up to date on all vaccines. This information is not intended to replace advice given to you by your health care provider. Make sure you discuss any questions you have with your health care provider. Document Revised: 10/02/2018 Document Reviewed: 10/02/2018 Elsevier Patient Education  2020 [employer].

## 2020-06-30 DIAGNOSIS — M963 Postlaminectomy kyphosis: Secondary | ICD-10-CM | POA: Diagnosis not present

## 2020-06-30 DIAGNOSIS — G894 Chronic pain syndrome: Secondary | ICD-10-CM | POA: Diagnosis not present

## 2020-06-30 DIAGNOSIS — Z79891 Long term (current) use of opiate analgesic: Secondary | ICD-10-CM | POA: Diagnosis not present

## 2020-06-30 DIAGNOSIS — M4722 Other spondylosis with radiculopathy, cervical region: Secondary | ICD-10-CM | POA: Diagnosis not present

## 2020-07-08 ENCOUNTER — Telehealth: Payer: Self-pay

## 2020-07-08 NOTE — Telephone Encounter (Signed)
Patient called requesting a refill on Astelin Nasal Spray.  She stated per previous conversation with PCP, PCP state she would start writing this script for her instead of her [employer] is [employer].

## 2020-07-10 ENCOUNTER — Other Ambulatory Visit: Payer: Self-pay | Admitting: Family Medicine

## 2020-07-11 NOTE — Telephone Encounter (Signed)
Medication refilled

## 2020-07-28 DIAGNOSIS — Z23 Encounter for immunization: Secondary | ICD-10-CM | POA: Diagnosis not present

## 2020-08-11 DIAGNOSIS — Z79891 Long term (current) use of opiate analgesic: Secondary | ICD-10-CM | POA: Diagnosis not present

## 2020-08-11 DIAGNOSIS — G894 Chronic pain syndrome: Secondary | ICD-10-CM | POA: Diagnosis not present

## 2020-08-15 DIAGNOSIS — M4722 Other spondylosis with radiculopathy, cervical region: Secondary | ICD-10-CM | POA: Diagnosis not present

## 2020-08-15 DIAGNOSIS — G894 Chronic pain syndrome: Secondary | ICD-10-CM | POA: Diagnosis not present

## 2020-08-15 DIAGNOSIS — Z79891 Long term (current) use of opiate analgesic: Secondary | ICD-10-CM | POA: Diagnosis not present

## 2020-08-15 DIAGNOSIS — M963 Postlaminectomy kyphosis: Secondary | ICD-10-CM | POA: Diagnosis not present

## 2020-08-18 ENCOUNTER — Encounter: Payer: Self-pay | Admitting: Family Medicine

## 2020-08-18 DIAGNOSIS — Z1211 Encounter for screening for malignant neoplasm of colon: Secondary | ICD-10-CM

## 2020-08-30 DIAGNOSIS — E119 Type 2 diabetes mellitus without complications: Secondary | ICD-10-CM | POA: Diagnosis not present

## 2020-08-30 LAB — HM DIABETES EYE EXAM

## 2020-09-01 ENCOUNTER — Encounter: Payer: Self-pay | Admitting: Family Medicine

## 2020-09-02 ENCOUNTER — Other Ambulatory Visit: Payer: Self-pay | Admitting: Family Medicine

## 2020-09-02 ENCOUNTER — Other Ambulatory Visit: Payer: Self-pay | Admitting: Cardiovascular Disease

## 2020-09-02 DIAGNOSIS — Z23 Encounter for immunization: Secondary | ICD-10-CM | POA: Diagnosis not present

## 2020-09-02 DIAGNOSIS — E039 Hypothyroidism, unspecified: Secondary | ICD-10-CM

## 2020-09-18 ENCOUNTER — Encounter: Payer: Self-pay | Admitting: Family Medicine

## 2020-09-22 ENCOUNTER — Ambulatory Visit: Payer: Medicare Other | Admitting: Family Medicine

## 2020-09-22 DIAGNOSIS — M963 Postlaminectomy kyphosis: Secondary | ICD-10-CM | POA: Diagnosis not present

## 2020-09-22 DIAGNOSIS — G894 Chronic pain syndrome: Secondary | ICD-10-CM | POA: Diagnosis not present

## 2020-09-22 DIAGNOSIS — M4722 Other spondylosis with radiculopathy, cervical region: Secondary | ICD-10-CM | POA: Diagnosis not present

## 2020-09-22 DIAGNOSIS — Z79891 Long term (current) use of opiate analgesic: Secondary | ICD-10-CM | POA: Diagnosis not present

## 2020-10-03 ENCOUNTER — Encounter: Payer: Self-pay | Admitting: Cardiovascular Disease

## 2020-10-03 ENCOUNTER — Ambulatory Visit (INDEPENDENT_AMBULATORY_CARE_PROVIDER_SITE_OTHER): Payer: Medicare Other | Admitting: Cardiovascular Disease

## 2020-10-03 ENCOUNTER — Other Ambulatory Visit: Payer: Self-pay

## 2020-10-03 VITALS — BP 158/70 | HR 76 | Ht 62.0 in | Wt 211.0 lb

## 2020-10-03 DIAGNOSIS — I5032 Chronic diastolic (congestive) heart failure: Secondary | ICD-10-CM | POA: Diagnosis not present

## 2020-10-03 DIAGNOSIS — I6523 Occlusion and stenosis of bilateral carotid arteries: Secondary | ICD-10-CM | POA: Diagnosis not present

## 2020-10-03 DIAGNOSIS — I10 Essential (primary) hypertension: Secondary | ICD-10-CM

## 2020-10-03 DIAGNOSIS — E785 Hyperlipidemia, unspecified: Secondary | ICD-10-CM

## 2020-10-03 DIAGNOSIS — I480 Paroxysmal atrial fibrillation: Secondary | ICD-10-CM

## 2020-10-03 DIAGNOSIS — I2510 Atherosclerotic heart disease of native coronary artery without angina pectoris: Secondary | ICD-10-CM

## 2020-10-03 NOTE — Progress Notes (Signed)
Chief Complaint  Patient presents with  . Follow-up    CAD   History of Present Illness: 74 yo female with history of PAF, HTN, hyperlipidemia, anti-phospholipid antibody syndrome, GERD, asthma, CAD here today for cardiac follow up. Cardiac cath in 2003 with moderate CAD (30% ostial LM stenosis, 50% mid LAD, 80% moderate sized Diagonal, 80% Circumflex). She has had no cath since then. She has chronic back pain. She has a hypercoagulable state and is on Xarelto. Most recent stress test October 2013 with no ischemia, normal LV function.  Last echo December 2018 with normal LV systolic function, grade 2 diastolic dysfunction and trivial mitral regurgitation. Carotid artery dopplers June 2018 with stable 40-59% bilateral stenosis. She was admitted to [facility] August 2017 with sepsis, ascending cholangitis complicated by respiratory failure, renal failure, CHF.  She underwent lap cholecystectomy 10/04/16. When seen in our office in December 2018, her BP was elevated and PVCs were present. Metoprolol was increased to 100 mg BID.   He is here today for follow up. The patient denies any chest pain, dyspnea, palpitations, lower extremity edema, orthopnea, PND, dizziness, near syncope or syncope.   Primary Care Physician: Darreld Mclean, MD  Past Medical History:  Diagnosis Date  . Anemia, iron deficiency   . Antiphospholipid syndrome (HCC)    with hypercoagulable state  . Asthma    extrinsic; moderate, persistant, nml spirometry 2010, nml CXR 1/08  . Bladder troubles    REPORTS INFECTIONS ON OCCASION DUE TO URETHRA MEATUS STRICTURE AT BIRTH   . CAD (coronary artery disease)    a. 50% mid LAD, 80% ostial D1 and moderate 80% mid circ by cath 2003. b. nonischemic nuc in 2013.  Marland Kitchen Cancer ([place])    basal cell removed fr. L arm   . Carotid arterial disease ([place])    a. 03/2017 showed 1-39% bilateral internal carotid stenosis (high end range on left), >50% LECA stenosis, with recommendation for  f/u duplex 03/2018.  Marland Kitchen Complication of anesthesia    cardiac arrest- in OR, at age 69 (49)y.o. during Scalenotomy in her early 20's  . Drug allergy    heparin/lovenox  . DVT (deep venous thrombosis) ([place])   . Erosive gastritis 1994  . Family history of adverse reaction to anesthesia    some family members have had trouble waking up  . GERD (gastroesophageal reflux disease)   . H/O hiatal hernia   . HLD (hyperlipidemia)   . HTN (hypertension)    Pt states her high blood pressure readings are due tot he method of measurementsMcAlhaney at [employer], manages pt.  LOV 03/2014  . Hypothyroidism   . Liver spot    cyst- - no biopsy, but told that its benign   . Moderate mitral regurgitation   . PAF (paroxysmal atrial fibrillation) ([place])   . Pulmonary embolism ([place])    related to back surgery with prior coumadin use, now off  . PVC's (premature ventricular contractions)    a. noted in 09/2017.  Marland Kitchen Spondylosis, lumbosacral    ARTHRITIS- OA     Past Surgical History:  Procedure Laterality Date  . ABDOMINAL HYSTERECTOMY     partial abdominal -1998  . BACK SURGERY     x13 cervical and lumbar thoracic spine surgery  . BREAST ENHANCEMENT SURGERY    . BREAST SURGERY     all benign cysts x3   . CARDIAC CATHETERIZATION     2005  . CHOLECYSTECTOMY N/A 10/04/2016   Procedure: LAPAROSCOPIC CHOLECYSTECTOMY;  Surgeon: Erroll Luna, MD;  Location: [facility] OR;  Service: General;  Laterality: N/A;  . cleft lip and palate repair     74 yo   . hysterectomy    . IR GENERIC HISTORICAL  06/04/2016   IR PERC CHOLECYSTOSTOMY 06/04/2016 Greggory Keen, MD [facility]INTER[facility] RAD  . IR GENERIC HISTORICAL  08/06/2016   IR CHOLANGIOGRAM EXISTING TUBE 08/06/2016 Aletta Edouard, MD [facility]  . IR GENERIC HISTORICAL  07/17/2016   IR RADIOLOGIST EVAL & MGMT 07/17/2016 [facility] INTERV RAD  . JOINT REPLACEMENT    . MOHS SURGERY  05/22/2020   right side forehead  . POSTERIOR CERVICAL FUSION/FORAMINOTOMY Right 08/31/2014    Procedure: Right cervical six-seven foraminotomy, Cervical five-Thoracic one fusion, Cervical six-seven lateral mass screws;  Surgeon: Erline Levine, MD;  Location: [place] [facility];  Service: Neurosurgery;  Laterality: Right;  . spina bifida repair     74 yo   . TONSILLECTOMY    . TOTAL KNEE ARTHROPLASTY     left    Current Outpatient Medications  Medication Sig Dispense Refill  . albuterol (PROVENTIL HFA;VENTOLIN HFA) 108 (90 Base) MCG/ACT inhaler Inhale 2 puffs into the lungs every 6 (six) hours as needed for wheezing or shortness of breath. 18 g 5  . azelastine (ASTELIN) 0.1 % nasal spray USE 1 TO 2 SPRAYS IN EACH NOSTRIL TWICE A DAY. 30 mL 2  . Calcium Carb-Cholecalciferol 500-400 MG-UNIT TABS Take 1 tablet by mouth 2 (two) times daily.    . cyclobenzaprine (FLEXERIL) 10 MG tablet Take 10 mg by mouth 3 (three) times daily as needed for muscle spasms.    Marland Kitchen docusate sodium (COLACE) 100 MG capsule Take 200 mg by mouth 2 (two) times daily.    Marland Kitchen EPINEPHrine 0.3 mg/0.3 mL IJ SOAJ injection Inject 0.3 mg into the muscle once. As needed for anaphylaxis    . fluticasone (FLOVENT HFA) 110 MCG/ACT inhaler Inhale 1-2 puffs into the lungs 2 (two) times daily. 12 g 12  . furosemide (LASIX) 40 MG tablet Take 1 tablet (40 mg total) by mouth daily. 90 tablet 3  . gabapentin (NEURONTIN) 600 MG tablet Take 600-750 mg by mouth at bedtime.     . Glucosamine-Chondroitin 750-600 MG TABS Take 2 tablets by mouth 2 (two) times daily.     Marland Kitchen HYDROmorphone (DILAUDID) 4 MG tablet Take 4 mg by mouth daily.     Marland Kitchen levothyroxine (SYNTHROID) 75 MCG tablet TAKE 1 TABLET ONCE DAILY ONE HOUR BEFORE BREAKFAST. 90 tablet 0  . loratadine-pseudoephedrine (CLARITIN-D 12 HOUR) 5-120 MG tablet Take 1 tablet by mouth 2 (two) times daily. 30 tablet 5  . Melatonin 5 MG TABS Take 2.5-5 mg by mouth at bedtime as needed.    . metoprolol tartrate (LOPRESSOR) 100 MG tablet Take 1 tablet (100 mg total) by mouth 2 (two) times daily. 180 tablet 3   . montelukast (SINGULAIR) 10 MG tablet TAKE 1 TABLET ONCE DAILY IN THE EVENING. 90 tablet 3  . Multiple Vitamin (MULTIVITAMIN WITH MINERALS) TABS Take 1 tablet by mouth daily.    . nitroGLYCERIN (NITROSTAT) 0.4 MG SL tablet DISSOLVE 1 TABLET UNDER TONGUE AS NEEDED FOR CHEST PAIN,MAY REPEAT IN5 MINUTES FOR 2 DOSES. 25 tablet 5  . nystatin (MYCOSTATIN/NYSTOP) powder APPLY TOPICALLY 3 TIMES DAILY. USE AS NEEDED FOR YEAST. 30 g 2  . potassium chloride (KLOR-CON) 10 MEQ tablet Take 4 tablets (40 mEq total) by mouth 2 (two) times daily. 240 tablet 5  . Respiratory Therapy Supplies (FLUTTER) DEVI  Use as directed 1 each 0  . rivaroxaban (XARELTO) 20 MG TABS tablet Take 1 tablet (20 mg total) by mouth daily with supper. 90 tablet 1  . rosuvastatin (CRESTOR) 20 MG tablet Take 1 tablet (20 mg total) by mouth daily. 90 tablet 3  . triamcinolone (NASACORT) 55 MCG/ACT nasal inhaler Place 1-2 sprays into the nose at bedtime.     . triamcinolone lotion (KENALOG) 0.1 %      No current facility-administered medications for this visit.    Allergies  Allergen Reactions  . Chlorhexidine Anaphylaxis and Itching    Sensitive to dye in Betadine & Chlorohexadine   . Enoxaparin Sodium Anaphylaxis  . Heparin Anaphylaxis  . Hornet Venom Anaphylaxis    European Hornets  . Pork-Derived Products Anaphylaxis  . Povidone-Iodine Itching and Rash    NOTE: Patient has had anaphylactic reaction to Betadine due to dyes in product. Sensitivity-" but if its wiped off she is able to tolerate betadine"   . Indomethacin Rash  . Lactose Intolerance (Gi) Diarrhea and Nausea And Vomiting    Can tolerate most [place] cheeses (Takes Lactaid)  . Phenylpropanolamine Hypertension    Social History   Socioeconomic History  . Marital status: Married    Spouse name: Not on file  . Number of children: Not on file  . Years of education: Not on file  . Highest education level: Not on file  Occupational History  . Not on file   Tobacco Use  . Smoking status: Never Smoker  . Smokeless tobacco: Never Used  . Tobacco comment: no smoking   Vaping Use  . Vaping Use: Never used  Substance and Sexual Activity  . Alcohol use: No  . Drug use: No  . Sexual activity: Not Currently  Other Topics Concern  . Not on file  Social History Narrative   Lives in [place] with husband; has had miscarriages but no children.    Disabled but exercises nearly every day (can only exericse in water - aerobics)   Takes opioids for pain relief; takes no herbal medications; has a heart healthy diet.    Social Determinants of Health   Financial Resource Strain: Low Risk   . Difficulty of Paying Living Expenses: Not hard at all  Food Insecurity: No Food Insecurity  . Worried About Charity fundraiser in the Last Year: Never true  . Ran Out of Food in the Last Year: Never true  Transportation Needs: No Transportation Needs  . Lack of Transportation (Medical): No  . Lack of Transportation (Non-Medical): No  Physical Activity: Not on file  Stress: Not on file  Social Connections: Not on file  Intimate Partner Violence: Not on file    Family History  Problem Relation Age of Onset  . Heart disease Father        MI   . Heart disease Sister     Review of Systems:  As stated in the HPI and otherwise negative.   BP (!) 158/70   Pulse 76   Ht 5\' 2"  (1.575 m)   Wt 211 lb (95.7 kg)   SpO2 95%   BMI 38.59 kg/m   Physical Examination:  General: Well developed, well nourished, NAD  HEENT: OP clear, mucus membranes moist  SKIN: warm, dry. No rashes. Neuro: No focal deficits  Musculoskeletal: Muscle strength 5/5 all ext  Psychiatric: Mood and affect normal  Neck: No JVD, no carotid bruits, no thyromegaly, no lymphadenopathy.  Lungs:Clear bilaterally, no wheezes, rhonci, crackles  Cardiovascular: Regular rate and rhythm. No murmurs, gallops or rubs. Abdomen:Soft. Bowel sounds present. Non-tender.  Extremities: No lower extremity  edema. Pulses are 2 + in the bilateral DP/PT.  Echo December 2018:  - Left ventricle: The cavity size was normal. Wall thickness was   normal. Systolic function was normal. The estimated ejection   fraction was in the range of 50% to 55%. Wall motion was normal;   there were no regional wall motion abnormalities. Features are   consistent with a pseudonormal left ventricular filling pattern,   with concomitant abnormal relaxation and increased filling   pressure (grade 2 diastolic dysfunction).  Cardiac cath 09/22/02: 1. Ventriculography was performed in the RAO projection. Overall ejection  fraction was 70%. No segmental wall motion abnormalities were  identified.  2. The left main coronary artery has, what appears to be, about 30%  narrowing at the ostium. In most views there does appear to be a patent  ostium; however, some tapered narrowing cannot be excluded.  3. The LAD proper has about 40-50% narrowing, at most, in the mid portion  beyond the diagonal takeoff. The vessel is calcified. The diagonal  takeoff itself has a long 80% stenosis at the ostium extending out into  the mid portion of the diagonal. The distal diagonal does appear to be  suitable for grafting.  4. The large circumflex has about 70-80% focal narrowing prior to the  bifurcation of the marginal.  5. The right coronary artery has some mild luminal irregularities but no  high-grade stenoses.  IMPRESSION:  1. Coronary artery disease with significant involvement and circumflex  involvement.  2. Multiple medical issues, as described above.  EKG:  EKG is not ordered today. The ekg ordered today demonstrates   Recent Labs: 03/23/2020: ALT 18; BUN 24; Creatinine, Ser 0.72; Hemoglobin 15.2; Platelets 230.0; Potassium 4.6; Sodium 138; TSH 3.73   Lipid Panel Lipid Panel     Component Value Date/Time   CHOL 186 03/23/2020 1544   TRIG 387.0 (H) 03/23/2020 1544   HDL 49.50 03/23/2020 1544   CHOLHDL 4 03/23/2020  1544   VLDL 77.4 (H) 03/23/2020 1544   LDLDIRECT 87.0 03/23/2020 1544     Wt Readings from Last 3 Encounters:  10/03/20 211 lb (95.7 kg)  03/28/20 201 lb (91.2 kg)  03/23/20 202 lb (91.6 kg)     Other studies Reviewed: Additional studies/ records that were reviewed today include: . Review of the above records demonstrates:    Assessment and Plan:   1. CAD without angina: She is known to have moderate CAD by cath in 2003. LV function normal by echo in December 2018. No chest pain. Continue beta blocker and statin. No ASA due to easy bruising while on Xarelto as well.    2. Atrial fib, paroxysmal: Sinus today. Continue beta blocker and Xarelto.       3. HYPERTENSION: BP controlled. No changes  4. CAROTID ARTERY DISEASE: Mild bilateral carotid disease by dopplers in 2021.   5. Hyperlipidemia: Lipids followed in primary care per pt. LDL 87 in June 2021. She was changed to Crestor then. Needs repeat lipids and LFTs now. She is asking to have this done in primary care. She sees Dr. Lorelei Pont later this week. Continue statin.    6. Chronic diastolic CHF: No volume overload on exam. Continue Lasix.   Current medicines are reviewed at length with the patient today.  The patient does not have concerns regarding medicines.  The following changes have been  made:  no change  Labs/ tests ordered today include:   No orders of the defined types were placed in this encounter.  Disposition:   F/U with me in 12  months  Signed, Lauree Chandler, MD 10/03/2020 3:22 PM    [facility] [place], [place], [place]  [address] Phone: [phone]; Fax: [phone]

## 2020-10-03 NOTE — Patient Instructions (Signed)

## 2020-10-05 ENCOUNTER — Encounter: Payer: Self-pay | Admitting: Family Medicine

## 2020-10-05 ENCOUNTER — Ambulatory Visit (INDEPENDENT_AMBULATORY_CARE_PROVIDER_SITE_OTHER): Payer: Medicare Other | Admitting: Family Medicine

## 2020-10-05 ENCOUNTER — Other Ambulatory Visit: Payer: Self-pay

## 2020-10-05 VITALS — BP 118/74 | HR 95 | Resp 18 | Ht 62.0 in | Wt 211.0 lb

## 2020-10-05 DIAGNOSIS — I2510 Atherosclerotic heart disease of native coronary artery without angina pectoris: Secondary | ICD-10-CM | POA: Diagnosis not present

## 2020-10-05 DIAGNOSIS — E039 Hypothyroidism, unspecified: Secondary | ICD-10-CM

## 2020-10-05 DIAGNOSIS — I6523 Occlusion and stenosis of bilateral carotid arteries: Secondary | ICD-10-CM | POA: Diagnosis not present

## 2020-10-05 DIAGNOSIS — I10 Essential (primary) hypertension: Secondary | ICD-10-CM | POA: Diagnosis not present

## 2020-10-05 DIAGNOSIS — Z1211 Encounter for screening for malignant neoplasm of colon: Secondary | ICD-10-CM | POA: Diagnosis not present

## 2020-10-05 DIAGNOSIS — E119 Type 2 diabetes mellitus without complications: Secondary | ICD-10-CM | POA: Diagnosis not present

## 2020-10-05 DIAGNOSIS — E785 Hyperlipidemia, unspecified: Secondary | ICD-10-CM

## 2020-10-05 NOTE — Patient Instructions (Addendum)
It was great to see you again today, please see me in about 6 months I will be in touch with your labs as soon as possible  We will refer you to see GI here at the [facility] for colon cancer evaluation Please consider getting the shingles vaccine at your convenience

## 2020-10-05 NOTE — Progress Notes (Addendum)
[facility] [place], [place], [place] [address] [phone]  Date:  10/05/2020   Name:  Latoya [name]   DOB:  Oct 06, 1946   MRN:  [id number]  PCP:  Darreld Mclean, MD    Chief Complaint: Diabetes and Hypothyroidism (6 month follow up/)   History of Present Illness:  Latoya [name] is a 74 y.o. very pleasant female patient who presents with the following:  Here today for a 85-month follow-up- history of diabetes, PAF, HTN, hyperlipidemia, anti-phospholipid antibody syndrome, GERD, asthma, CAD Last seen by myself in June of this year Married to [name] who is also my patient Never smoker  She saw her cardiologist, Dr. Angelena Form earlier this week-her CAD is stable, no angina. She is taking beta-blocker and Xarelto for paroxysmal atrial fibrillation  We changed her lipid agent to Crestor over the summer, needs recheck today-she did eat breakfast but has been fasting for about 5 hours  COVID-19 booster done  Flu vaccine- done  Colon cancer screening due next year Mammogram 1 year ago- she has this arranged  Can offer to arrange DEXA scan- she has this arranged as well  Shingrix-patient is holding off for now, history of strong reaction to vaccines  She is having knee pain which is causing her a lot of pain and making it harder for her to walk- she is seeing an orthopedist and having some injections.  They started to discuss surgery but she is not sure about this Her neurosurgeon is Dr Vertell Limber  She is on pain management per Dr Nicholaus Bloom - she has been his pt for over 20 years She is not able to get into her pool exercise right now due to covid 19  She is on Qvar inhaler right now-it is helpful, but she notes the new inhaler propellant seems to give out after about 2 weeks, and she can no longer get medication of the inhaler.  I am not sure what to do about this, I do not think her insurance will allow her to fill  an inhaler every 2 weeks  09/22/2020  09/22/2020   1  Hydromorphone 4 Mg Tablet  180.00  30  [place]  [id number]  Gat (0700)  0/0  96.00 MME  Comm Ins  [place]    08/11/2020  08/11/2020   1  Hydromorphone 4 Mg Tablet  180.00  30  [place]  [id number]  Gat (0700)  0/0  96.00 MME  Comm Ins  [place]    06/30/2020  06/30/2020   1  Hydromorphone 4 Mg Tablet  180.00  30  Ma [place]i  [id number]  Gat (0700)  0/0  96.00 MME  Comm Ins  [place]    05/19/2020  05/19/2020   1  Hydromorphone 4 Mg Tablet  180.00  30  [place]  [id number]  Gat (0700)  0/0  96.00 MME  Comm Ins  [place]    04/07/2020  04/07/2020   1  Hydromorphone 4 Mg Tablet  180.00  30  Ma [place]  [id number]  Gat (0700)  0/0  96.00 MME  Comm Ins  [place]    02/25/2020  02/25/2020   1  Hydromorphone 4 Mg Tablet  180.00  30  Ma Phi  [id number]  Gat (0700)  0/0  96.00 MME  Comm Ins      01/28/2020  01/28/2020   1  Hydromorphone 4 Mg Tablet  180.00  [place]  [id number]          Patient Active Problem List   Diagnosis Date Noted  . Spondylosis, lumbosacral   . Pulmonary embolism ([place])   . PAF (paroxysmal atrial fibrillation) ([place])   . Moderate mitral regurgitation   . H/O hiatal hernia   . DVT (deep venous thrombosis) ([place])   . Complication of anesthesia   . Carotid arterial disease ([place])   . Cancer ([place])   . Antiphospholipid syndrome ([place])   . Anemia, iron deficiency   . Diabetes mellitus without complication ([place]) 26/94/8546  . Leukocytosis 08/04/2016  . Hypothyroidism 08/04/2016  . Anemia of chronic disease 08/04/2016  . Gastroparesis   . Enterococcal bacteremia 06/06/2016  . Bacteremia due to Klebsiella pneumoniae 06/06/2016  . Bacteremia due to Escherichia coli 06/06/2016  . Ascending cholangitis 06/04/2016  . Hemangioma of liver 06/04/2016  . Cervical pseudoarthrosis ([place]) 08/31/2014  . Chest pain, atypical 04/11/2013  . PAD (peripheral artery disease) ([place]) 06/20/2011  . BACK PAIN, CHRONIC 06/01/2010  . CAD in native artery 11/15/2009  . MURMUR 11/15/2009  .  CAROTID BRUIT, LEFT 11/15/2009  . Essential hypertension 03/18/2009  . Asthma 03/18/2009  . GERD 03/18/2009  . Hyperlipidemia 11/29/2008  . Erosive gastritis 10/22/1992    Past Medical History:  Diagnosis Date  . Anemia, iron deficiency   . Antiphospholipid syndrome (HCC)    with hypercoagulable state  . Asthma    extrinsic; moderate, persistant, nml spirometry 2010, nml CXR 1/08  . Bladder troubles    REPORTS INFECTIONS ON OCCASION DUE TO URETHRA MEATUS STRICTURE AT BIRTH   . CAD (coronary artery disease)    a. 50% mid LAD, 80% ostial D1 and moderate 80% mid circ by cath 2003. b. nonischemic nuc in 2013.  Marland Kitchen Cancer ([place])    basal cell removed fr. L arm   . Carotid arterial disease ([place])    a. 03/2017 showed 1-39% bilateral internal carotid stenosis (high end range on left), >50% LECA stenosis, with recommendation for f/u duplex 03/2018.  Marland Kitchen Complication of anesthesia    cardiac arrest- in OR, at age 35 (34)y.o. during Scalenotomy in her early 20's  . Drug allergy    heparin/lovenox  . DVT (deep venous thrombosis) ([place])   . Erosive gastritis 1994  . Family history of adverse reaction to anesthesia    some family members have had trouble waking up  . GERD (gastroesophageal reflux disease)   . H/O hiatal hernia   . HLD (hyperlipidemia)   . HTN (hypertension)    Pt states her high blood pressure readings are due tot he method of measurementsMc[facility]aney at [facility], manages pt.  LOV 03/2014  . Hypothyroidism   . Liver spot    cyst- - no biopsy, but told that its benign   . Moderate mitral regurgitation   . PAF (paroxysmal atrial fibrillation) ([place])   . Pulmonary embolism ([place])    related to back surgery with prior coumadin use, now off  . PVC's (premature ventricular contractions)    a. noted in 09/2017.  Marland Kitchen Spondylosis, lumbosacral    ARTHRITIS- OA     Past Surgical History:  Procedure Laterality Date  . ABDOMINAL HYSTERECTOMY     partial abdominal -1998  . BACK SURGERY      x13 cervical and lumbar thoracic spine surgery  . BREAST ENHANCEMENT SURGERY    . BREAST SURGERY     all benign cysts x3   . CARDIAC CATHETERIZATION  2005  . CHOLECYSTECTOMY N/A 10/04/2016   Procedure: LAPAROSCOPIC CHOLECYSTECTOMY;  Surgeon: Erroll Luna, MD;  Location: [facility] OR;  Service: General;  Laterality: N/A;  . cleft lip and palate repair     74 yo   . hysterectomy    . IR GENERIC HISTORICAL  06/04/2016   IR PERC CHOLECYSTOSTOMY 06/04/2016 Greggory Keen, MD [facility]  . IR GENERIC HISTORICAL  08/06/2016   IR CHOLANGIOGRAM EXISTING TUBE 08/06/2016 Aletta Edouard, MD [facility]  . IR GENERIC HISTORICAL  07/17/2016   IR RADIOLOGIST EVAL & MGMT 07/17/2016 [facility]  . JOINT REPLACEMENT    . MOHS SURGERY  05/22/2020   right side forehead  . POSTERIOR CERVICAL FUSION/FORAMINOTOMY Right 08/31/2014   Procedure: Right cervical six-seven foraminotomy, Cervical five-Thoracic one fusion, Cervical six-seven lateral mass screws;  Surgeon: Erline Levine, MD;  Location: [facility];  Service: Neurosurgery;  Laterality: Right;  . spina bifida repair     74 yo   . TONSILLECTOMY    . TOTAL KNEE ARTHROPLASTY     left    Social History   Tobacco Use  . Smoking status: Never Smoker  . Smokeless tobacco: Never Used  . Tobacco comment: no smoking   Vaping Use  . Vaping Use: Never used  Substance Use Topics  . Alcohol use: No  . Drug use: No    Family History  Problem Relation Age of Onset  . Heart disease Father        MI   . Heart disease Sister     Allergies  Allergen Reactions  . Chlorhexidine Anaphylaxis and Itching    Sensitive to dye in Betadine & Chlorohexadine   . Enoxaparin Sodium Anaphylaxis  . Heparin Anaphylaxis  . Hornet Venom Anaphylaxis    [place] Hornets  . Pork-Derived Products Anaphylaxis  . Povidone-Iodine Itching and Rash    NOTE: Patient has had anaphylactic reaction to Betadine due to dyes in product. Sensitivity-" but if its wiped off  she is able to tolerate betadine"   . Indomethacin Rash  . Lactose Intolerance (Gi) Diarrhea and Nausea And Vomiting    Can tolerate most [place] cheeses (Takes Lactaid)  . Phenylpropanolamine Hypertension    Medication list has been reviewed and updated.  Current Outpatient Medications on File Prior to Visit  Medication Sig Dispense Refill  . albuterol (PROVENTIL HFA;VENTOLIN HFA) 108 (90 Base) MCG/ACT inhaler Inhale 2 puffs into the lungs every 6 (six) hours as needed for wheezing or shortness of breath. 18 g 5  . azelastine (ASTELIN) 0.1 % nasal spray USE 1 TO 2 SPRAYS IN EACH NOSTRIL TWICE A DAY. 30 mL 2  . Calcium Carb-Cholecalciferol 500-400 MG-UNIT TABS Take 1 tablet by mouth 2 (two) times daily.    . cyclobenzaprine (FLEXERIL) 10 MG tablet Take 10 mg by mouth 3 (three) times daily as needed for muscle spasms.    Marland Kitchen docusate sodium (COLACE) 100 MG capsule Take 200 mg by mouth 2 (two) times daily.    Marland Kitchen EPINEPHrine 0.3 mg/0.3 mL IJ SOAJ injection Inject 0.3 mg into the muscle once. As needed for anaphylaxis    . fluticasone (FLOVENT HFA) 110 MCG/ACT inhaler Inhale 1-2 puffs into the lungs 2 (two) times daily. 12 g 12  . furosemide (LASIX) 40 MG tablet Take 1 tablet (40 mg total) by mouth daily. 90 tablet 3  . gabapentin (NEURONTIN) 600 MG tablet Take 600-750 mg by mouth at bedtime.     . Glucosamine-Chondroitin 750-600 MG TABS  Take 2 tablets by mouth 2 (two) times daily.     Marland Kitchen HYDROmorphone (DILAUDID) 4 MG tablet Take 4 mg by mouth daily.     Marland Kitchen levothyroxine (SYNTHROID) 75 MCG tablet TAKE 1 TABLET ONCE DAILY ONE HOUR BEFORE BREAKFAST. 90 tablet 0  . loratadine-pseudoephedrine (CLARITIN-D 12 HOUR) 5-120 MG tablet Take 1 tablet by mouth 2 (two) times daily. 30 tablet 5  . Melatonin 5 MG TABS Take 2.5-5 mg by mouth at bedtime as needed.    . metoprolol tartrate (LOPRESSOR) 100 MG tablet Take 1 tablet (100 mg total) by mouth 2 (two) times daily. 180 tablet 3  . montelukast (SINGULAIR) 10 MG  tablet TAKE 1 TABLET ONCE DAILY IN THE EVENING. 90 tablet 3  . Multiple Vitamin (MULTIVITAMIN WITH MINERALS) TABS Take 1 tablet by mouth daily.    . nitroGLYCERIN (NITROSTAT) 0.4 MG SL tablet DISSOLVE 1 TABLET UNDER TONGUE AS NEEDED FOR CHEST PAIN,MAY REPEAT IN5 MINUTES FOR 2 DOSES. 25 tablet 5  . nystatin (MYCOSTATIN/NYSTOP) powder APPLY TOPICALLY 3 TIMES DAILY. USE AS NEEDED FOR YEAST. 30 g 2  . potassium chloride (KLOR-CON) 10 MEQ tablet Take 4 tablets (40 mEq total) by mouth 2 (two) times daily. 240 tablet 5  . Respiratory Therapy Supplies (FLUTTER) DEVI Use as directed 1 each 0  . rivaroxaban (XARELTO) 20 MG TABS tablet Take 1 tablet (20 mg total) by mouth daily with supper. 90 tablet 1  . rosuvastatin (CRESTOR) 20 MG tablet Take 1 tablet (20 mg total) by mouth daily. 90 tablet 3  . triamcinolone (NASACORT) 55 MCG/ACT nasal inhaler Place 1-2 sprays into the nose at bedtime.     . triamcinolone lotion (KENALOG) 0.1 %      No current facility-administered medications on file prior to visit.    Review of Systems:  As per HPI- otherwise negative.   Physical Examination: Vitals:   10/05/20 1434  BP: 118/74  Pulse: 95  Resp: 18  SpO2: 97%   Vitals:   10/05/20 1434  Weight: 211 lb (95.7 kg)  Height: 5\' 2"  (1.575 m)   Body mass index is 38.59 kg/m. Ideal Body Weight: Weight in (lb) to have BMI = 25: 136.4  GEN: no acute distress.  Overweight, looks well  HEENT: Atraumatic, Normocephalic.  Ears and Nose: No external deformity. CV: RRR, No M/G/R. No JVD. No thrill. No extra heart sounds. PULM: CTA B, no wheezes, crackles, rhonchi. No retractions. No resp. distress. No accessory muscle use.Marland Kitchen EXTR: No c/c/e PSYCH: Normally interactive. Conversant.  She is using a rolling walker, severe kyphosis and stooped gait as it is normal for patient  Assessment and Plan: Hyperlipidemia, unspecified hyperlipidemia type - Plan: Lipid panel  Essential hypertension - Plan: Comprehensive  metabolic panel, CBC  CAD in native artery  Diabetes mellitus without complication ([place]) - Plan: Hemoglobin A1c  Hypothyroidism, unspecified type - Plan: TSH  Screening for colon cancer - Plan: Ambulatory referral to Gastroenterology  Patient following up today We changed her antilipid agent to Crestor over the summer, check lipids today Blood pressures under reasonable control, labs as above A1c pending follow-up of diabetes Follow-up thyroid, TSH Coming due for colon cancer screening, needs referral to GI This visit occurred during the SARS-CoV-2 public health emergency.  Safety protocols were in place, including screening questions prior to the visit, additional usage of staff PPE, and extensive cleaning of exam room while observing appropriate contact time as indicated for disinfecting solutions.    Signed Lamar Blinks, MD  Addendum 12/16, received her labs as below.  Message to patient  Results for orders placed or performed in visit on 10/05/20  Comprehensive metabolic panel  Result Value Ref Range   Sodium 139 135 - 145 mEq/L   Potassium 4.6 3.5 - 5.1 mEq/L   Chloride 101 96 - 112 mEq/L   CO2 29 19 - 32 mEq/L   Glucose, Bld 203 (H) 70 - 99 mg/dL   BUN 20 6 - 23 mg/dL   Creatinine, Ser 0.81 0.40 - 1.20 mg/dL   Total Bilirubin 0.5 0.2 - 1.2 mg/dL   Alkaline Phosphatase 80 39 - 117 U/L   AST 20 0 - 37 U/L   ALT 19 0 - 35 U/L   Total Protein 7.7 6.0 - 8.3 g/dL   Albumin 4.8 3.5 - 5.2 g/dL   GFR 71.91 >60.00 mL/min   Calcium 10.2 8.4 - 10.5 mg/dL  CBC  Result Value Ref Range   WBC 7.6 4.0 - 10.5 K/uL   RBC 5.07 3.87 - 5.11 Mil/uL   Platelets 239.0 150.0 - 400.0 K/uL   Hemoglobin 14.8 12.0 - 15.0 g/dL   HCT 44.2 36.0 - 46.0 %   MCV 87.2 78.0 - 100.0 fl   MCHC 33.5 30.0 - 36.0 g/dL   RDW 13.0 11.5 - 15.5 %  Lipid panel  Result Value Ref Range   Cholesterol 161 0 - 200 mg/dL   Triglycerides 330.0 (H) 0.0 - 149.0 mg/dL   HDL 51.00 >39.00 mg/dL   VLDL 66.0 (H)  0.0 - 40.0 mg/dL   Total CHOL/HDL Ratio 3    NonHDL 110.41   Hemoglobin A1c  Result Value Ref Range   Hgb A1c MFr Bld 8.0 (H) 4.6 - 6.5 %  TSH  Result Value Ref Range   TSH 6.14 (H) 0.35 - 4.50 uIU/mL  LDL cholesterol, direct  Result Value Ref Range   Direct LDL 76.0 mg/dL

## 2020-10-06 ENCOUNTER — Encounter: Payer: Self-pay | Admitting: Family Medicine

## 2020-10-06 DIAGNOSIS — E039 Hypothyroidism, unspecified: Secondary | ICD-10-CM

## 2020-10-06 LAB — LIPID PANEL
Cholesterol: 161 mg/dL (ref 0–200)
HDL: 51 mg/dL (ref >39.00)
NonHDL: 110.41
Total CHOL/HDL Ratio: 3
Triglycerides: 330 mg/dL — ABNORMAL HIGH (ref 0.0–149.0)
VLDL: 66 mg/dL — ABNORMAL HIGH (ref 0.0–40.0)

## 2020-10-06 LAB — COMPREHENSIVE METABOLIC PANEL
ALT: 19 U/L (ref 0–35)
AST: 20 U/L (ref 0–37)
Albumin: 4.8 g/dL (ref 3.5–5.2)
Alkaline Phosphatase: 80 U/L (ref 39–117)
BUN: 20 mg/dL (ref 6–23)
CO2: 29 mEq/L (ref 19–32)
Calcium: 10.2 mg/dL (ref 8.4–10.5)
Chloride: 101 mEq/L (ref 96–112)
Creatinine, Ser: 0.81 mg/dL (ref 0.40–1.20)
GFR: 71.91 mL/min (ref >60.00)
Glucose, Bld: 203 mg/dL — ABNORMAL HIGH (ref 70–99)
Potassium: 4.6 mEq/L (ref 3.5–5.1)
Sodium: 139 mEq/L (ref 135–145)
Total Bilirubin: 0.5 mg/dL (ref 0.2–1.2)
Total Protein: 7.7 g/dL (ref 6.0–8.3)

## 2020-10-06 LAB — CBC
HCT: 44.2 % (ref 36.0–46.0)
Hemoglobin: 14.8 g/dL (ref 12.0–15.0)
MCHC: 33.5 g/dL (ref 30.0–36.0)
MCV: 87.2 fl (ref 78.0–100.0)
Platelets: 239 10*3/uL (ref 150.0–400.0)
RBC: 5.07 Mil/uL (ref 3.87–5.11)
RDW: 13 % (ref 11.5–15.5)
WBC: 7.6 10*3/uL (ref 4.0–10.5)

## 2020-10-06 LAB — HEMOGLOBIN A1C: Hgb A1c MFr Bld: 8 % — ABNORMAL HIGH (ref 4.6–6.5)

## 2020-10-06 LAB — LDL CHOLESTEROL, DIRECT: Direct LDL: 76 mg/dL

## 2020-10-06 LAB — TSH: TSH: 6.14 u[IU]/mL — ABNORMAL HIGH (ref 0.35–4.50)

## 2020-10-11 DIAGNOSIS — Z1231 Encounter for screening mammogram for malignant neoplasm of breast: Secondary | ICD-10-CM | POA: Diagnosis not present

## 2020-10-11 LAB — HM MAMMOGRAPHY

## 2020-10-11 MED ORDER — LEVOTHYROXINE SODIUM 100 MCG PO TABS: ORAL_TABLET | ORAL | 3 refills | Status: DC

## 2020-10-11 MED ORDER — METFORMIN HCL 500 MG PO TABS: 500.0000 mg | ORAL_TABLET | Freq: Two times a day (BID) | ORAL | 3 refills | Status: DC

## 2020-10-11 NOTE — Telephone Encounter (Signed)
Patient states she would like to increase the tyroid medication to 100 and begin metformin.

## 2020-10-13 ENCOUNTER — Encounter: Payer: Self-pay | Admitting: Family Medicine

## 2020-10-19 ENCOUNTER — Encounter: Payer: Self-pay | Admitting: Family Medicine

## 2020-10-28 ENCOUNTER — Other Ambulatory Visit: Payer: Self-pay | Admitting: Cardiovascular Disease

## 2020-10-28 NOTE — Telephone Encounter (Signed)
Xarelto 20mg  refill request received. Pt is 75 years old, weight-95.7kg, Crea-0.81 on 10/05/2020, last seen by Dr. Angelena Form on 10/03/2020, Diagnosis-Afib, CrCl-93.91ml/min; Dose is appropriate based on dosing criteria. Will send in refill to requested pharmacy.

## 2020-11-11 ENCOUNTER — Ambulatory Visit: Payer: Medicare Other | Admitting: Gastroenterology

## 2020-11-15 ENCOUNTER — Ambulatory Visit (INDEPENDENT_AMBULATORY_CARE_PROVIDER_SITE_OTHER): Payer: Medicare Other | Admitting: Gastroenterology

## 2020-11-15 ENCOUNTER — Telehealth: Payer: Self-pay

## 2020-11-15 ENCOUNTER — Encounter: Payer: Self-pay | Admitting: Gastroenterology

## 2020-11-15 VITALS — BP 138/82 | HR 80 | Ht 62.0 in | Wt 210.0 lb

## 2020-11-15 DIAGNOSIS — K219 Gastro-esophageal reflux disease without esophagitis: Secondary | ICD-10-CM | POA: Diagnosis not present

## 2020-11-15 DIAGNOSIS — Z8379 Family history of other diseases of the digestive system: Secondary | ICD-10-CM

## 2020-11-15 DIAGNOSIS — Z7901 Long term (current) use of anticoagulants: Secondary | ICD-10-CM | POA: Diagnosis not present

## 2020-11-15 DIAGNOSIS — Z800 Family history of malignant neoplasm of digestive organs: Secondary | ICD-10-CM

## 2020-11-15 DIAGNOSIS — I4891 Unspecified atrial fibrillation: Secondary | ICD-10-CM | POA: Diagnosis not present

## 2020-11-15 NOTE — Telephone Encounter (Signed)
Patient with diagnosis of ATRIAL FIBRILLATION & ANTIPHOSPHOLIPID SYNDROME on XARELTO for anticoagulation.    NOTED PRIOR HX OF VTE(PE & DVT)  Procedure: COLONOSCOPY Date of procedure: 12/05/2020   CHA2DS2-VASc Score = 5  This indicates a 7.2% annual risk of stroke. The patient's score is based upon: CHF History: No HTN History: Yes Diabetes History: Yes Stroke History: No Vascular Disease History: Yes Age Score: 1 Gender Score: 1   CrCl = 71ML/MIN Platelet count = 239  Per office protocol, patient can hold XARELTO for 1 day (24 HOURS) prior to procedure.

## 2020-11-15 NOTE — Progress Notes (Signed)
Chief Complaint: Colon cancer screening, family history of colon cancer   Referring Provider:     Darreld Mclean, MD   HPI:    Latoya [name] is a 75 y.o. female with a history of diabetes, paroxysmal A. fib (on Xarelto), HTN, HLD, antiphospholipid antibody syndrome, asthma, CAD, chronic diastolic CHF, GERD, osteoarthritis, chronic back pain, hypothyroidism, cervical fusion 2015, history of ascending cholangitis 05/2016 with subsequent cholecystectomy 09/2016, referred to the [facility]ology [facility] for evaluation of continued colon cancer screening.   Per review of EMR, last colonoscopy was 01/2011 by Dr. Wynetta [name], and normal with recommendation repeat in 5 years due to strong family history of colon cancer. To the best of her knowledge, no repeat has been performed in the interim. She is otherwise without any GI symptoms.  Multiple family members with Colon Cancer, including sister (diagnosed in her 80's), cousin, great aunt (late 58's) and another first-degree relative with Barrett's Esophagus.  Separately, long standing hx of GERD since her 45's. Avoids eating close to bedtime and inciting foods (carbonated beverages, chocolate, spicy, overeating). Index sxs of nausea/vomiting, regurgitation, HB. No dysphagia. Controlled with Nexium 40 mg, with rare breathrough requiring 2nd dose.   She follows with Dr. Lorelei [name] in [facility] and with Dr. Angelena [name] in the Cardiology Clinic. Last echo in 09/2017 with normal LV systolic function, grade 2 diastolic dysfunction and trivial MR.  Has received Covid vaccine series plus booster.  Endoscopic History: -EGD (12/1992, Latoya [name]): Severe erosive gastritis -EGD/push enteroscopy (05/2010, Latoya [name]): Normal to proximal jejunum -Colonoscopy (01/2011, Dr. Wynetta [name]): Normal. Repeat 5 years  CBC Latest Ref Rng & Units 10/05/2020 03/23/2020 10/08/2019  WBC 4.0 - 10.5 K/uL 7.6 7.1 7.5  Hemoglobin 12.0 - 15.0 g/dL 14.8 15.2(H) 15.5(H)   Hematocrit 36.0 - 46.0 % 44.2 45.2 46.0  Platelets 150.0 - 400.0 K/uL 239.0 230.0 249.0   CMP Latest Ref Rng & Units 10/05/2020 03/23/2020 10/08/2019  Glucose 70 - 99 mg/dL 203(H) 172(H) 133(H)  BUN 6 - 23 mg/dL 20 24(H) 23  Creatinine 0.40 - 1.20 mg/dL 0.81 0.72 0.75  Sodium 135 - 145 mEq/L 139 138 138  Potassium 3.5 - 5.1 mEq/L 4.6 4.6 4.3  Chloride 96 - 112 mEq/L 101 101 101  CO2 19 - 32 mEq/L 29 27 28   Calcium 8.4 - 10.5 mg/dL 10.2 10.1 10.2  Total Protein 6.0 - 8.3 g/dL 7.7 7.4 -  Total Bilirubin 0.2 - 1.2 mg/dL 0.5 0.5 -  Alkaline Phos 39 - 117 U/L 80 78 -  AST 0 - 37 U/L 20 19 -  ALT 0 - 35 U/L 19 18 -      Past Medical History:  Diagnosis Date  . Anemia, iron deficiency   . Antiphospholipid syndrome (HCC)    with hypercoagulable state  . Asthma    extrinsic; moderate, persistant, nml spirometry 2010, nml CXR 1/08  . Bladder troubles    REPORTS INFECTIONS ON OCCASION DUE TO URETHRA MEATUS STRICTURE AT BIRTH   . CAD (coronary artery disease)    a. 50% mid LAD, 80% ostial D1 and moderate 80% mid circ by cath 2003. b. nonischemic nuc in 2013.  Marland Kitchen Cancer ([place])    basal cell removed fr. L arm   . Carotid arterial disease ([place])    a. 03/2017 showed 1-39% bilateral internal carotid stenosis (high end range on left), >50% LECA stenosis, with recommendation for f/u duplex 03/2018.  Marland Kitchen  Complication of anesthesia    cardiac arrest- in OR, at age 56 (11)y.o. during Scalenotomy in her early 20's  . Diabetes ([place])   . Drug allergy    heparin/lovenox  . DVT (deep venous thrombosis) ([place])   . Erosive gastritis 1994  . Family history of adverse reaction to anesthesia    some family members have had trouble waking up  . GERD (gastroesophageal reflux disease)   . H/O hiatal hernia   . History of colon polyps   . HLD (hyperlipidemia)   . HTN (hypertension)    Pt states her high blood pressure readings are due tot he method of measurementsMcAlhaney at [employer], manages pt.  LOV  03/2014  . Hypothyroidism   . Liver spot    cyst- - no biopsy, but told that its benign   . Moderate mitral regurgitation   . PAF (paroxysmal atrial fibrillation) ([place])   . Pulmonary embolism ([place])    related to back surgery with prior coumadin use, now off  . PVC's (premature ventricular contractions)    a. noted in 09/2017.  Marland Kitchen Septic shock ([place])   . Spondylosis, lumbosacral    ARTHRITIS- OA      Past Surgical History:  Procedure Laterality Date  . ABDOMINAL HYSTERECTOMY     partial abdominal -1998  . BACK SURGERY     x13 cervical and lumbar thoracic spine surgery  . BREAST ENHANCEMENT SURGERY    . BREAST SURGERY     all benign cysts x3   . CARDIAC CATHETERIZATION     2005  . CHOLECYSTECTOMY N/A 10/04/2016   Procedure: LAPAROSCOPIC CHOLECYSTECTOMY;  Surgeon: Latoya Luna, MD;  Location: [facility] OR;  Service: General;  Laterality: N/A;  . cleft lip and palate repair     75 yo   . hysterectomy    . IR GENERIC HISTORICAL  06/04/2016   IR PERC CHOLECYSTOSTOMY 06/04/2016 Latoya Keen, MD [name]  . IR GENERIC HISTORICAL  08/06/2016   IR CHOLANGIOGRAM EXISTING TUBE 08/06/2016 Latoya Edouard, MD [name]  . IR GENERIC HISTORICAL  07/17/2016   IR RADIOLOGIST EVAL & MGMT 07/17/2016 [name]  . JOINT REPLACEMENT    . MOHS SURGERY  05/22/2020   right side forehead  . POSTERIOR CERVICAL FUSION/FORAMINOTOMY Right 08/31/2014   Procedure: Right cervical six-seven foraminotomy, Cervical five-Thoracic one fusion, Cervical six-seven lateral mass screws;  Surgeon: Latoya Levine, MD;  Location: [facility];  Service: Neurosurgery;  Laterality: Right;  . spina bifida repair     75 yo   . TONSILLECTOMY    . TOTAL KNEE ARTHROPLASTY     left   Family History  Problem Relation Age of Onset  . Heart disease Father        MI   . Heart disease Sister   . Colon cancer Sister   . Colon cancer Cousin        a guy-on father side   . Colon cancer Paternal Aunt        great aunt     Social History   Tobacco Use  . Smoking status: Never Smoker  . Smokeless tobacco: Never Used  . Tobacco comment: no smoking   Vaping Use  . Vaping Use: Never used  Substance Use Topics  . Alcohol use: No  . Drug use: No   Current Outpatient Medications  Medication Sig Dispense Refill  . albuterol (PROVENTIL HFA;VENTOLIN HFA) 108 (90 Base) MCG/ACT inhaler Inhale 2 puffs into the lungs every 6 (six)  hours as needed for wheezing or shortness of breath. 18 g 5  . azelastine (ASTELIN) 0.1 % nasal spray USE 1 TO 2 SPRAYS IN EACH NOSTRIL TWICE A DAY. 30 mL 2  . Calcium Carb-Cholecalciferol 500-400 MG-UNIT TABS Take 1 tablet by mouth 2 (two) times daily.    . cyclobenzaprine (FLEXERIL) 10 MG tablet Take 10 mg by mouth 3 (three) times daily as needed for muscle spasms.    Marland Kitchen docusate sodium (COLACE) 100 MG capsule Take 200 mg by mouth 2 (two) times daily.    Marland Kitchen EPINEPHrine 0.3 mg/0.3 mL IJ SOAJ injection Inject 0.3 mg into the muscle once. As needed for anaphylaxis    . fluticasone (FLOVENT HFA) 110 MCG/ACT inhaler Inhale 1-2 puffs into the lungs 2 (two) times daily. 12 g 12  . furosemide (LASIX) 40 MG tablet Take 1 tablet (40 mg total) by mouth daily. 90 tablet 3  . gabapentin (NEURONTIN) 600 MG tablet Take 600-750 mg by mouth at bedtime.     . Glucosamine-Chondroitin 750-600 MG TABS Take 2 tablets by mouth 2 (two) times daily.     Marland Kitchen HYDROmorphone (DILAUDID) 4 MG tablet Take 4 mg by mouth daily.     Marland Kitchen levothyroxine (SYNTHROID) 100 MCG tablet Take one by mouth daily 30 tablet 3  . loratadine-pseudoephedrine (CLARITIN-D 12 HOUR) 5-120 MG tablet Take 1 tablet by mouth 2 (two) times daily. 30 tablet 5  . Melatonin 5 MG TABS Take 2.5-5 mg by mouth at bedtime as needed.    . metFORMIN (GLUCOPHAGE) 500 MG tablet Take 1 tablet (500 mg total) by mouth 2 (two) times daily with a meal. Start with one daily and increase to BID if tolerated 180 tablet 3  . metoprolol tartrate (LOPRESSOR) 100 MG tablet  Take 1 tablet (100 mg total) by mouth 2 (two) times daily. 180 tablet 3  . montelukast (SINGULAIR) 10 MG tablet TAKE 1 TABLET ONCE DAILY IN THE EVENING. 90 tablet 3  . Multiple Vitamin (MULTIVITAMIN WITH MINERALS) TABS Take 1 tablet by mouth daily.    . nitroGLYCERIN (NITROSTAT) 0.4 MG SL tablet DISSOLVE 1 TABLET UNDER TONGUE AS NEEDED FOR CHEST PAIN,MAY REPEAT IN5 MINUTES FOR 2 DOSES. 25 tablet 5  . nystatin (MYCOSTATIN/NYSTOP) powder APPLY TOPICALLY 3 TIMES DAILY. USE AS NEEDED FOR YEAST. 30 g 2  . potassium chloride (KLOR-CON) 10 MEQ tablet Take 4 tablets (40 mEq total) by mouth 2 (two) times daily. 240 tablet 5  . Respiratory Therapy Supplies (FLUTTER) DEVI Use as directed 1 each 0  . rosuvastatin (CRESTOR) 20 MG tablet Take 1 tablet (20 mg total) by mouth daily. 90 tablet 3  . triamcinolone (NASACORT) 55 MCG/ACT nasal inhaler Place 1-2 sprays into the nose at bedtime.     . triamcinolone lotion (KENALOG) 0.1 %     . XARELTO 20 MG TABS tablet TAKE 1 TABLET ONCE A DAY WITH SUPPER. 30 tablet 11   No current facility-administered medications for this visit.   Allergies  Allergen Reactions  . Chlorhexidine Anaphylaxis and Itching    Sensitive to dye in Betadine & Chlorohexadine   . Enoxaparin Sodium Anaphylaxis  . Heparin Anaphylaxis  . Hornet Venom Anaphylaxis    European Hornets  . Pork-Derived Products Anaphylaxis  . Povidone-Iodine Itching and Rash    NOTE: Patient has had anaphylactic reaction to Betadine due to dyes in product. Sensitivity-" but if its wiped off she is able to tolerate betadine"   . Indomethacin Rash  . Lactose Intolerance (Gi)  Diarrhea and Nausea And Vomiting    Can tolerate most [place] cheeses (Takes Lactaid)  . Phenylpropanolamine Hypertension     Review of Systems: All systems reviewed and negative except where noted in HPI.     Physical Exam:    Wt Readings from Last 3 Encounters:  11/15/20 210 lb (95.3 kg)  10/05/20 211 lb (95.7 kg)  10/03/20  211 lb (95.7 kg)    Ht 5\' 2"  (1.575 m)   Wt 210 lb (95.3 kg)   BMI 38.41 kg/m  Constitutional:  Pleasant, in no acute distress. Psychiatric: Normal mood and affect. Behavior is normal. EENT: Pupils normal.  Conjunctivae are normal. No scleral icterus. Neck supple. No cervical LAD. Cardiovascular: Normal rate, regular rhythm. No edema Pulmonary/chest: Effort normal and breath sounds normal. No wheezing, rales or rhonchi. Abdominal: Soft, nondistended, nontender. Bowel sounds active throughout. There are no masses palpable. No hepatomegaly. Neurological: Alert and oriented to person place and time. Skin: Skin is warm and dry. No rashes noted.   ASSESSMENT AND [name];   1) Family history of colon cancer -Schedule colonoscopy for ongoing screening  2) GERD 3) Family history of Barrett's Esophagus -EGD for BE screening at the time of colonoscopy above -Continue Nexium as prescribed -Continue antireflux lifestyle/dietary modifications -Evaluate for erosive esophagitis, LES laxity, hiatal hernia time of EGD  3) Paroxysmal A. fib 4) Systemic anticoagulation -Hold Xarelto to days before procedure - will instruct when and how to resume after procedure. Low but real risk of cardiovascular event such as heart attack, stroke, embolism, thrombosis or ischemia/infarct of other organs off Xarelto explained and need to seek urgent help if this occurs. The patient consents to proceed. Will communicate by phone or EMR with patient's prescribing provider to confirm that holding Xarelto is reasonable in this case  The indications, risks, and benefits of EGD and colonoscopy were explained to the patient in detail. Risks include but are not limited to bleeding, perforation, adverse reaction to medications, and cardiopulmonary compromise. Sequelae include but are not limited to the possibility of surgery, hospitalization, and mortality. The patient verbalized understanding and wished to proceed. All  questions answered, referred to scheduler and bowel prep ordered. Further recommendations pending results of the exam.    Lavena Bullion, DO, FACG  11/15/2020, 3:23 PM   Copland, Gay Filler, MD

## 2020-11-15 NOTE — Telephone Encounter (Signed)
   Primary Cardiologist: Lauree Chandler, MD  Chart reviewed as part of pre-operative protocol coverage.    Patient with diagnosis of ATRIAL FIBRILLATION & ANTIPHOSPHOLIPID SYNDROME on XARELTO for anticoagulation.    NOTED PRIOR HX OF VTE(PE & DVT)  Procedure: COLONOSCOPY Date of procedure: 12/05/2020   CHA2DS2-VASc Score = 5  This indicates a 7.2% annual risk of stroke. The patient's score is based upon: CHF History: No HTN History: Yes Diabetes History: Yes Stroke History: No Vascular Disease History: Yes Age Score: 1 Gender Score: 1   CrCl = 71ML/MIN Platelet count = 239  Per office protocol, patient can hold XARELTO for 1 day (24 HOURS) prior to procedure.   I will route this recommendation to the requesting party via Epic fax function and remove from pre-op pool.  Please call with questions.  Jossie Ng. Drina Jobst NP-C    11/15/2020, 4:43 PM [address] Office [phone] Fax [phone]

## 2020-11-15 NOTE — Patient Instructions (Signed)
If you are age 74 or older, your body mass index should be between 23-30. Your Body mass index is 38.41 kg/m. If this is out of the aforementioned range listed, please consider follow up with your Primary Care Provider.  If you are age 24 or younger, your body mass index should be between 19-25. Your Body mass index is 38.41 kg/m. If this is out of the aformentioned range listed, please consider follow up with your Primary Care Provider.   You have been scheduled for a colonoscopy. Please follow written instructions given to you at your visit today.  Please pick up your prep supplies at the pharmacy within the next 1-3 days. If you use inhalers (even only as needed), please bring them with you on the day of your procedure.  You will be contacted by our office prior to your procedure for directions on holding your Xarelto.  If you do not hear from our office 1 week prior to your scheduled procedure, please call [phone] to discuss.    It was a pleasure to see you today!  Vito Cirigliano, D.O.

## 2020-11-15 NOTE — Telephone Encounter (Signed)
[facility] Pre-operative Risk Assessment     Request for surgical clearance:     Endoscopy Procedure  What type of surgery is being performed?     Colonoscopy  When is this surgery scheduled?     12/05/20   What type of clearance is required ?   Pharmacy  Are there any medications that need to be held prior to surgery and how long? Xarelto 2 days   Practice name and name of physician performing surgery?      [facility]  What is your office phone and fax number?      Phone- [phone]  [phone]  Anesthesia type (None, local, MAC, general) ?       MAC

## 2020-11-22 ENCOUNTER — Other Ambulatory Visit: Payer: Self-pay

## 2020-11-22 DIAGNOSIS — Z800 Family history of malignant neoplasm of digestive organs: Secondary | ICD-10-CM

## 2020-11-22 DIAGNOSIS — I4891 Unspecified atrial fibrillation: Secondary | ICD-10-CM

## 2020-11-29 ENCOUNTER — Telehealth: Payer: Self-pay | Admitting: Gastroenterology

## 2020-11-29 NOTE — Telephone Encounter (Signed)
Pt called looking to schedule her colonoscopy. She stated that we told her that it needs to be done at the hospital and that we would call her to schedule. Pls call her.

## 2020-11-30 NOTE — Telephone Encounter (Signed)
Can you please clarify if this patient needs ECL at [facility] hospital or just a colonoscopy and where it needs to be done. Seems to be some confusion. Thanks

## 2020-11-30 NOTE — Telephone Encounter (Signed)
Needs both EGD/Colo, and I believe the case was reviewed by Osvaldo Angst who recommended to be done at [facility]. Thanks.

## 2020-12-02 NOTE — Telephone Encounter (Signed)
Spoke to patient regarding a date and time for [facility] EGD/Colon. We discussed a few dates and I will contact her on 12/20/20 to give her a better idea as to when this procedure will be done. Patient voiced understanding.

## 2020-12-02 NOTE — Telephone Encounter (Signed)
Patient is calling to follow up on previous message 

## 2020-12-05 ENCOUNTER — Encounter: Payer: Medicare Other | Admitting: Gastroenterology

## 2020-12-05 ENCOUNTER — Telehealth: Payer: Self-pay

## 2020-12-05 NOTE — Telephone Encounter (Signed)
   Primary Cardiologist: Lauree Chandler, MD  See recommendations below regarding holding anticoagulation for upcoming procedure.  Richardson Dopp, PA-C 12/05/2020, 3:48 PM

## 2020-12-05 NOTE — Telephone Encounter (Signed)
[facility] Pre-operative Risk Assessment     Request for surgical clearance:     Endoscopy Procedure  What type of surgery is being performed?     EGD/colonoscopy  When is this surgery scheduled?     TBD 11/30/20  What type of clearance is required ?   Pharmacy  Are there any medications that need to be held prior to surgery and how long? Xarelto  Practice name and name of physician performing surgery?      [facility]  What is your office phone and fax number?      Phone- [phone]  [phone]  Anesthesia type (None, local, MAC, general) ?       MAC

## 2020-12-05 NOTE — Telephone Encounter (Signed)
Patient with diagnosis of afib, PE, DVT, and antiphospholipid syndrome/hypercoagulable state on Xarelto for anticoagulation.    Procedure: EGD/colonoscopy Date of procedure: TBD (request also states 2/9 which was last week, will need to clarify)  CHA2DS2-VASc Score = 8  This indicates a 10.8% annual risk of stroke. The patient's score is based upon: CHF History: Yes HTN History: Yes Diabetes History: Yes Stroke History: Yes (recurrent VTE) Vascular Disease History: Yes Age Score: 1 Gender Score: 1  CrCl 66mL/min Platelet count 239K  Per office protocol, patient can hold Xarelto for 1 day prior to procedure. Resume as soon as safely possible given elevated stroke/VTE risk off of anticoagulation.

## 2020-12-09 ENCOUNTER — Other Ambulatory Visit: Payer: Self-pay | Admitting: Cardiovascular Disease

## 2020-12-13 DIAGNOSIS — G894 Chronic pain syndrome: Secondary | ICD-10-CM | POA: Diagnosis not present

## 2020-12-13 DIAGNOSIS — M4722 Other spondylosis with radiculopathy, cervical region: Secondary | ICD-10-CM | POA: Diagnosis not present

## 2020-12-13 DIAGNOSIS — Z79891 Long term (current) use of opiate analgesic: Secondary | ICD-10-CM | POA: Diagnosis not present

## 2020-12-13 DIAGNOSIS — M963 Postlaminectomy kyphosis: Secondary | ICD-10-CM | POA: Diagnosis not present

## 2020-12-16 ENCOUNTER — Other Ambulatory Visit: Payer: Self-pay | Admitting: Gastroenterology

## 2020-12-16 DIAGNOSIS — Z800 Family history of malignant neoplasm of digestive organs: Secondary | ICD-10-CM

## 2020-12-16 DIAGNOSIS — Z7901 Long term (current) use of anticoagulants: Secondary | ICD-10-CM

## 2020-12-16 DIAGNOSIS — K219 Gastro-esophageal reflux disease without esophagitis: Secondary | ICD-10-CM

## 2020-12-16 MED ORDER — CLENPIQ 10-3.5-12 MG-GM -GM/160ML PO SOLN: 1.0000 | Freq: Once | ORAL | 0 refills | Status: AC

## 2020-12-16 NOTE — Telephone Encounter (Signed)
Spoke to patient to inform her that she has received cardiac clearance  to hold xarelto 1 day prior to 01/26/21 EGD/colonoscopy. She has requested a pre visit to go over her instructions. She is scheduled for 01/16/21.

## 2020-12-29 ENCOUNTER — Other Ambulatory Visit: Payer: Self-pay | Admitting: Family Medicine

## 2020-12-29 NOTE — Addendum Note (Signed)
Addended by: Kem Boroughs D on: 12/29/2020 10:45 AM   Modules accepted: Orders

## 2020-12-29 NOTE — Telephone Encounter (Signed)
Called pharmacy to deny rx because patient has htn.  She will need an ov.

## 2020-12-30 ENCOUNTER — Other Ambulatory Visit: Payer: Self-pay | Admitting: Family Medicine

## 2021-01-02 ENCOUNTER — Encounter: Payer: Self-pay | Admitting: Family Medicine

## 2021-01-02 NOTE — Telephone Encounter (Signed)
Patient has HTN, do you want to refill

## 2021-01-07 MED ORDER — METFORMIN HCL ER 500 MG PO TB24: 500.0000 mg | ORAL_TABLET | Freq: Two times a day (BID) | ORAL | 3 refills | Status: DC

## 2021-01-07 MED ORDER — CLARITIN-D 12 HOUR 5-120 MG PO TB12: ORAL_TABLET | ORAL | 3 refills | Status: DC

## 2021-01-16 ENCOUNTER — Ambulatory Visit (AMBULATORY_SURGERY_CENTER): Payer: Self-pay

## 2021-01-16 ENCOUNTER — Other Ambulatory Visit: Payer: Self-pay

## 2021-01-16 VITALS — Ht 62.0 in | Wt 206.0 lb

## 2021-01-16 DIAGNOSIS — Z800 Family history of malignant neoplasm of digestive organs: Secondary | ICD-10-CM

## 2021-01-16 DIAGNOSIS — K219 Gastro-esophageal reflux disease without esophagitis: Secondary | ICD-10-CM

## 2021-01-16 NOTE — Progress Notes (Signed)
No allergies to soy or egg Pt is not on blood thinners or diet pills Denies issues with sedation/intubation Denies atrial flutter/fib Denies constipation    Pt is aware of Covid safety and care partner requirements.   Fall risk-uses walker  Spondylosis, is bent at the waist. Also has blood disorder  Dr. Jamey Ripa pt on 10/2020  Pt denies kidney function issues, had rather take the clenpiq that was already sent to pharmacy  Had neck injured previously while being intubated  Pt states has a small mouth.

## 2021-01-20 ENCOUNTER — Other Ambulatory Visit: Payer: Self-pay | Admitting: Cardiovascular Disease

## 2021-01-20 ENCOUNTER — Encounter (HOSPITAL_COMMUNITY): Payer: Self-pay | Admitting: Gastroenterology

## 2021-01-20 ENCOUNTER — Other Ambulatory Visit: Payer: Self-pay

## 2021-01-24 ENCOUNTER — Other Ambulatory Visit (HOSPITAL_COMMUNITY)
Admission: RE | Admit: 2021-01-24 | Discharge: 2021-01-24 | Disposition: A | Discharge status: Home / Self Care | Payer: Medicare Other | Source: Ambulatory Visit | Attending: Gastroenterology | Admitting: Gastroenterology

## 2021-01-24 DIAGNOSIS — Z20822 Contact with and (suspected) exposure to covid-19: Secondary | ICD-10-CM | POA: Diagnosis not present

## 2021-01-24 DIAGNOSIS — Z01812 Encounter for preprocedural laboratory examination: Secondary | ICD-10-CM | POA: Diagnosis not present

## 2021-01-25 LAB — SARS CORONAVIRUS 2 (TAT 6-24 HRS): SARS Coronavirus 2: NEGATIVE

## 2021-01-25 NOTE — Anesthesia Preprocedure Evaluation (Addendum)
Anesthesia Evaluation  Patient identified by MRN, date of birth, ID band Patient awake    Reviewed: Allergy & Precautions, NPO status , Patient's Chart, lab work & pertinent test results  History of Anesthesia Complications (+) history of anesthetic complications (had neck injured while being intubated)  Airway Mallampati: III  TM Distance: >3 FB Neck ROM: Full  Mouth opening: Limited Mouth Opening  Dental no notable dental hx. (+) Teeth Intact, Dental Advisory Given   Pulmonary    Pulmonary exam normal breath sounds clear to auscultation       Cardiovascular hypertension, Pt. on home beta blockers and Pt. on medications Normal cardiovascular exam Rhythm:Regular Rate:Normal  EF 50-55%   Neuro/Psych negative neurological ROS  negative psych ROS   GI/Hepatic GERD  ,  Endo/Other  diabetes, Type 2, Oral Hypoglycemic AgentsHypothyroidism   Renal/GU      Musculoskeletal  (+) Arthritis , Uses walker   Abdominal (+) + obese,   Peds  Hematology  (+) anemia ,   Anesthesia Other Findings All: see list  Reproductive/Obstetrics                            Anesthesia Physical Anesthesia Plan  ASA: III  Anesthesia Plan: MAC   Post-op Pain Management:    Induction:   PONV Risk Score and Plan: Treatment may vary due to age or medical condition  Airway Management Planned:   Additional Equipment: None  Intra-op Plan:   Post-operative Plan:   Informed Consent: I have reviewed the patients History and Physical, chart, labs and discussed the procedure including the risks, benefits and alternatives for the proposed anesthesia with the patient or authorized representative who has indicated his/her understanding and acceptance.     Dental advisory given  Plan Discussed with: CRNA and Anesthesiologist  Anesthesia Plan Comments:       Anesthesia Quick Evaluation

## 2021-01-26 ENCOUNTER — Ambulatory Visit (HOSPITAL_COMMUNITY)
Admission: RE | Admit: 2021-01-26 | Discharge: 2021-01-26 | Disposition: A | Discharge status: Home / Self Care | Payer: Medicare Other | Attending: Gastroenterology | Admitting: Gastroenterology

## 2021-01-26 ENCOUNTER — Other Ambulatory Visit: Payer: Self-pay | Admitting: Cardiovascular Disease

## 2021-01-26 ENCOUNTER — Other Ambulatory Visit: Payer: Self-pay

## 2021-01-26 ENCOUNTER — Ambulatory Visit (HOSPITAL_COMMUNITY): Payer: Medicare Other | Admitting: Anesthesiology

## 2021-01-26 ENCOUNTER — Encounter (HOSPITAL_COMMUNITY): Payer: Self-pay | Admitting: Gastroenterology

## 2021-01-26 ENCOUNTER — Other Ambulatory Visit: Payer: Self-pay | Admitting: Family Medicine

## 2021-01-26 ENCOUNTER — Encounter (HOSPITAL_COMMUNITY)
Admission: RE | Disposition: A | Discharge status: Home / Self Care | Payer: Self-pay | Source: Home / Self Care | Attending: Gastroenterology

## 2021-01-26 DIAGNOSIS — K5730 Diverticulosis of large intestine without perforation or abscess without bleeding: Secondary | ICD-10-CM

## 2021-01-26 DIAGNOSIS — Z8674 Personal history of sudden cardiac arrest: Secondary | ICD-10-CM | POA: Diagnosis not present

## 2021-01-26 DIAGNOSIS — I110 Hypertensive heart disease with heart failure: Secondary | ICD-10-CM | POA: Insufficient documentation

## 2021-01-26 DIAGNOSIS — Z888 Allergy status to other drugs, medicaments and biological substances status: Secondary | ICD-10-CM | POA: Insufficient documentation

## 2021-01-26 DIAGNOSIS — Z981 Arthrodesis status: Secondary | ICD-10-CM | POA: Diagnosis not present

## 2021-01-26 DIAGNOSIS — E119 Type 2 diabetes mellitus without complications: Secondary | ICD-10-CM | POA: Diagnosis not present

## 2021-01-26 DIAGNOSIS — D6861 Antiphospholipid syndrome: Secondary | ICD-10-CM | POA: Diagnosis not present

## 2021-01-26 DIAGNOSIS — D125 Benign neoplasm of sigmoid colon: Secondary | ICD-10-CM

## 2021-01-26 DIAGNOSIS — K2970 Gastritis, unspecified, without bleeding: Secondary | ICD-10-CM | POA: Diagnosis not present

## 2021-01-26 DIAGNOSIS — K219 Gastro-esophageal reflux disease without esophagitis: Secondary | ICD-10-CM | POA: Insufficient documentation

## 2021-01-26 DIAGNOSIS — Z7901 Long term (current) use of anticoagulants: Secondary | ICD-10-CM | POA: Insufficient documentation

## 2021-01-26 DIAGNOSIS — Z1211 Encounter for screening for malignant neoplasm of colon: Secondary | ICD-10-CM | POA: Diagnosis not present

## 2021-01-26 DIAGNOSIS — D120 Benign neoplasm of cecum: Secondary | ICD-10-CM | POA: Insufficient documentation

## 2021-01-26 DIAGNOSIS — K2289 Other specified disease of esophagus: Secondary | ICD-10-CM | POA: Diagnosis not present

## 2021-01-26 DIAGNOSIS — K5520 Angiodysplasia of colon without hemorrhage: Secondary | ICD-10-CM | POA: Insufficient documentation

## 2021-01-26 DIAGNOSIS — Z800 Family history of malignant neoplasm of digestive organs: Secondary | ICD-10-CM | POA: Diagnosis not present

## 2021-01-26 DIAGNOSIS — K449 Diaphragmatic hernia without obstruction or gangrene: Secondary | ICD-10-CM | POA: Diagnosis not present

## 2021-01-26 DIAGNOSIS — K621 Rectal polyp: Secondary | ICD-10-CM | POA: Diagnosis not present

## 2021-01-26 DIAGNOSIS — Z7984 Long term (current) use of oral hypoglycemic drugs: Secondary | ICD-10-CM | POA: Insufficient documentation

## 2021-01-26 DIAGNOSIS — I5032 Chronic diastolic (congestive) heart failure: Secondary | ICD-10-CM | POA: Diagnosis not present

## 2021-01-26 DIAGNOSIS — K2950 Unspecified chronic gastritis without bleeding: Secondary | ICD-10-CM | POA: Diagnosis not present

## 2021-01-26 DIAGNOSIS — K635 Polyp of colon: Secondary | ICD-10-CM | POA: Diagnosis not present

## 2021-01-26 DIAGNOSIS — K2990 Gastroduodenitis, unspecified, without bleeding: Secondary | ICD-10-CM | POA: Diagnosis not present

## 2021-01-26 DIAGNOSIS — E039 Hypothyroidism, unspecified: Secondary | ICD-10-CM

## 2021-01-26 DIAGNOSIS — I480 Paroxysmal atrial fibrillation: Secondary | ICD-10-CM | POA: Diagnosis not present

## 2021-01-26 HISTORY — PX: POLYPECTOMY: SHX5525

## 2021-01-26 HISTORY — PX: COLONOSCOPY WITH PROPOFOL: SHX5780

## 2021-01-26 HISTORY — PX: ESOPHAGOGASTRODUODENOSCOPY (EGD) WITH PROPOFOL: SHX5813

## 2021-01-26 HISTORY — PX: BIOPSY: SHX5522

## 2021-01-26 LAB — GLUCOSE, CAPILLARY: Glucose-Capillary: 151 mg/dL — ABNORMAL HIGH (ref 70–99)

## 2021-01-26 SURGERY — COLONOSCOPY WITH PROPOFOL
Anesthesia: General

## 2021-01-26 MED ORDER — PROPOFOL 500 MG/50ML IV EMUL
INTRAVENOUS | Status: DC | PRN
  Administered 2021-01-26: 100 ug/kg/min via INTRAVENOUS

## 2021-01-26 MED ORDER — PROPOFOL 10 MG/ML IV BOLUS
INTRAVENOUS | Status: DC | PRN
  Administered 2021-01-26 (×2): 20 mg via INTRAVENOUS
  Administered 2021-01-26: 40 mg via INTRAVENOUS

## 2021-01-26 MED ORDER — ESOMEPRAZOLE MAGNESIUM 40 MG PO CPDR: 40.0000 mg | DELAYED_RELEASE_CAPSULE | Freq: Two times a day (BID) | ORAL | 3 refills | Status: DC

## 2021-01-26 MED ORDER — PROPOFOL 1000 MG/100ML IV EMUL
INTRAVENOUS | Status: AC
  Filled 2021-01-26: qty 100

## 2021-01-26 MED ORDER — LACTATED RINGERS IV SOLN: INTRAVENOUS | Status: DC

## 2021-01-26 MED ORDER — LIDOCAINE 2% (20 MG/ML) 5 ML SYRINGE
INTRAMUSCULAR | Status: DC | PRN
  Administered 2021-01-26: 80 mg via INTRAVENOUS

## 2021-01-26 SURGICAL SUPPLY — 24 items

## 2021-01-26 NOTE — Anesthesia Procedure Notes (Signed)
Date/Time: 01/26/2021 8:25 AM Performed by: Talbot Grumbling, CRNA Oxygen Delivery Method: Simple face mask

## 2021-01-26 NOTE — Anesthesia Postprocedure Evaluation (Signed)
Anesthesia Post Note  Patient: Latoya [name]  Procedure(s) Performed: COLONOSCOPY WITH PROPOFOL (N/A ) ESOPHAGOGASTRODUODENOSCOPY (EGD) WITH PROPOFOL (N/A ) BIOPSY POLYPECTOMY     Patient location during evaluation: Endoscopy Anesthesia Type: General Level of consciousness: awake and alert Pain management: pain level controlled Vital Signs Assessment: post-procedure vital signs reviewed and stable Respiratory status: spontaneous breathing, nonlabored ventilation, respiratory function stable and patient connected to nasal cannula oxygen Cardiovascular status: blood pressure returned to baseline and stable Postop Assessment: no apparent nausea or vomiting Anesthetic complications: no   No complications documented.  Last Vitals:  Vitals:   01/26/21 0930 01/26/21 0940  BP: (!) 166/72 (!) 175/75  Pulse: 77 78  Resp: 19 16  Temp:    SpO2: 97% 97%    Last Pain:  Vitals:   01/26/21 0940  TempSrc:   PainSc: 0-No pain                 Barnet Glasgow

## 2021-01-26 NOTE — Interval H&P Note (Signed)
History and Physical Interval Note:  01/26/2021 8:20 AM  Latoya [name]  has presented today for surgery, with the diagnosis of CCS GERD.  The various methods of treatment have been discussed with the patient and family. After consideration of risks, benefits and other options for treatment, the patient has consented to  Procedure(s): COLONOSCOPY WITH PROPOFOL (N/A) ESOPHAGOGASTRODUODENOSCOPY (EGD) WITH PROPOFOL (N/A) as a surgical intervention.  The patient's history has been reviewed, patient examined, no change in status, stable for surgery.  I have reviewed the patient's chart and labs.  Questions were answered to the patient's satisfaction.     Dominic Pea [name]

## 2021-01-26 NOTE — Transfer of Care (Signed)
Immediate Anesthesia Transfer of Care Note  Patient: Latoya [name]  Procedure(s) Performed: COLONOSCOPY WITH PROPOFOL (N/A ) ESOPHAGOGASTRODUODENOSCOPY (EGD) WITH PROPOFOL (N/A ) BIOPSY POLYPECTOMY  Patient Location: PACU  Anesthesia Type:MAC  Level of Consciousness: awake, alert  and oriented  Airway & Oxygen Therapy: Patient Spontanous Breathing and Patient connected to face mask oxygen  Post-op Assessment: Report given to RN and Post -op Vital signs reviewed and stable  Post vital signs: Reviewed and stable  Last Vitals:  Vitals Value Taken Time  BP    Temp    Pulse    Resp    SpO2      Last Pain:  Vitals:   01/26/21 0748  TempSrc: Temporal  PainSc: 7          Complications: No complications documented.

## 2021-01-26 NOTE — Discharge Instructions (Signed)
YOU HAD AN ENDOSCOPIC PROCEDURE TODAY: Refer to the procedure report and other information in the discharge instructions given to you for any specific questions about what was found during the examination. If this information does not answer your questions, please call [facility] office at [phone] to clarify.   YOU SHOULD EXPECT: Some feelings of bloating in the abdomen. Passage of more gas than usual. Walking can help get rid of the air that was put into your GI tract during the procedure and reduce the bloating. If you had a lower endoscopy (such as a colonoscopy or flexible sigmoidoscopy) you may notice spotting of blood in your stool or on the toilet paper. Some abdominal soreness may be present for a day or two, also.  DIET: Your first meal following the procedure should be a light meal and then it is ok to progress to your normal diet. A half-sandwich or bowl of soup is an example of a good first meal. Heavy or fried foods are harder to digest and may make you feel nauseous or bloated. Drink plenty of fluids but you should avoid alcoholic beverages for 24 hours. If you had a esophageal dilation, please see attached instructions for diet.    ACTIVITY: Your care partner should take you home directly after the procedure. You should plan to take it easy, moving slowly for the rest of the day. You can resume normal activity the day after the procedure however YOU SHOULD NOT DRIVE, use power tools, machinery or perform tasks that involve climbing or major physical exertion for 24 hours (because of the sedation medicines used during the test).   SYMPTOMS TO REPORT IMMEDIATELY: A gastroenterologist can be reached at any hour. Please call [phone]  for any of the following symptoms:  . Following lower endoscopy (colonoscopy, flexible sigmoidoscopy) Excessive amounts of blood in the stool  Significant tenderness, worsening of abdominal pains  Swelling of the abdomen that is new, acute  Fever of 100  or higher  . Following upper endoscopy (EGD, EUS, ERCP, esophageal dilation) Vomiting of blood or coffee ground material  New, significant abdominal pain  New, significant chest pain or pain under the shoulder blades  Painful or persistently difficult swallowing  New shortness of breath  Black, tarry-looking or red, bloody stools  FOLLOW UP:  If any biopsies were taken you will be contacted by phone or by letter within the next 1-3 weeks. Call [phone]  if you have not heard about the biopsies in 3 weeks.  Please also call with any specific questions about appointments or follow up tests.Follow instructions for restarting xarelto in 2 days.

## 2021-01-26 NOTE — Op Note (Signed)
[facility] Patient Name: Latoya [name] Procedure Date: 01/26/2021 MRN: [id number] Attending MD: Gerrit Heck , MD Date of Birth: 1946/09/27 CSN: [id number] Age: 75 Admit Type: Outpatient Procedure:                Upper GI endoscopy Indications:              Esophageal reflux, Exclusion of Barrett's esophagus Providers:                Gerrit Heck, MD, Cleda Daub, RN, Cherylynn Ridges, Technician, Marla Roe, CRNA Referring MD:              Medicines:                Monitored Anesthesia Care Complications:            No immediate complications. Estimated Blood Loss:     Estimated blood loss was minimal. Procedure:                Pre-Anesthesia Assessment:                           - Prior to the procedure, a History and Physical                            was performed, and patient medications and                            allergies were reviewed. The patient's tolerance of                            previous anesthesia was also reviewed. The risks                            and benefits of the procedure and the sedation                            options and risks were discussed with the patient.                            All questions were answered, and informed consent                            was obtained. Prior Anticoagulants: The patient has                            taken no previous anticoagulant or antiplatelet                            agents. ASA Grade Assessment: III - A patient with                            severe systemic disease. After reviewing the risks  and benefits, the patient was deemed in                            satisfactory condition to undergo the procedure.                           After obtaining informed consent, the endoscope was                            passed under direct vision. Throughout the                            procedure, the patient's blood pressure,  pulse, and                            oxygen saturations were monitored continuously. The                            [id number]-H190 ([id number]) Olympus gastroscope was                            introduced through the mouth, and advanced to the                            second part of duodenum. The upper GI endoscopy was                            accomplished without difficulty. The patient                            tolerated the procedure well. Scope In: Scope Out: Findings:      Esophagogastric landmarks were identified: the gastroesophageal junction       was found at 33 cm and the site of hiatal narrowing was found at 38 cm       from the incisors.      A 5 cm hiatal hernia was present.      The Z-line was irregular and was found 33 cm from the incisors. There       were 2 small islands of salmon colored mucosa located immediately       proximal to the Z line. Biopsies were taken with a cold forceps for       histology. Estimated blood loss was minimal.      Diffuse mild inflammation characterized by congestion (edema) and       erythema was found in the entire examined stomach. Biopsies were taken       with a cold forceps for Helicobacter pylori testing. Estimated blood       loss was minimal.      The examined duodenum was normal. Impression:               - Esophagogastric landmarks identified.                           - 5 cm hiatal hernia.                           -  Z-line irregular, 33 cm from the incisors.                            Biopsied.                           - Gastritis. Biopsied.                           - Normal examined duodenum. Moderate Sedation:      Not Applicable - Patient had care per Anesthesia. Recommendation:           - Patient has a contact number available for                            emergencies. The signs and symptoms of potential                            delayed complications were discussed with the                            patient. Return  to normal activities tomorrow.                            Written discharge instructions were provided to the                            patient.                           - Resume previous diet.                           - Continue present medications.                           - Await pathology results.                           - Increase Nexium (esomeprazole) to 40 mg PO BID                            for 6 weeks, then reduce back to 40 mg daily.                           - Resume Xarelto (rivaroxaban) at prior dose in 2                            days. Procedure Code(s):        --- Professional ---                           [phone], Esophagogastroduodenoscopy, flexible,                            transoral; with biopsy, single or multiple Diagnosis Code(s):        ---  Professional ---                           K44.9, Diaphragmatic hernia without obstruction or                            gangrene                           K22.8, Other specified diseases of esophagus                           K29.70, Gastritis, unspecified, without bleeding                           K21.9, Gastro-esophageal reflux disease without                            esophagitis CPT copyright 2019 American [employer] Association. All rights reserved. The codes documented in this report are preliminary and upon coder review may  be revised to meet current compliance requirements. Gerrit Heck, MD 01/26/2021 9:14:13 AM Number of Addenda: 0

## 2021-01-26 NOTE — Op Note (Signed)
[facility] Patient Name: Latoya [name] Procedure Date: 01/26/2021 MRN: [id number] Attending MD: Gerrit Heck , MD Date of Birth: 09-01-1946 CSN: [id number] Age: 75 Admit Type: Outpatient Procedure:                Colonoscopy Indications:              Screening in patient at increased risk: Colorectal                            cancer in sister before age 45                           Last Colonoscopy was 01/2011 (Dr. Wynetta Emery): Normal Providers:                Gerrit Heck, MD, Cleda Daub, RN, Cherylynn Ridges, Technician, Marla Roe, CRNA Referring MD:              Medicines:                Monitored Anesthesia Care Complications:            No immediate complications. Estimated Blood Loss:     Estimated blood loss was minimal. Procedure:                Pre-Anesthesia Assessment:                           - Prior to the procedure, a History and Physical                            was performed, and patient medications and                            allergies were reviewed. The patient's tolerance of                            previous anesthesia was also reviewed. The risks                            and benefits of the procedure and the sedation                            options and risks were discussed with the patient.                            All questions were answered, and informed consent                            was obtained. Prior Anticoagulants: The patient has                            taken Xarelto (rivaroxaban), last dose was 2 days  prior to procedure. ASA Grade Assessment: III - A                            patient with severe systemic disease. After                            reviewing the risks and benefits, the patient was                            deemed in satisfactory condition to undergo the                            procedure.                           After obtaining informed  consent, the colonoscope                            was passed under direct vision. Throughout the                            procedure, the patient's blood pressure, pulse, and                            oxygen saturations were monitored continuously. The                            CF-HQ190L ([id number]) Olympus colonoscope was                            introduced through the anus and advanced to the the                            cecum, identified by appendiceal orifice and                            ileocecal valve. The colonoscopy was performed                            without difficulty. The patient tolerated the                            procedure well. The quality of the bowel                            preparation was good. The ileocecal valve,                            appendiceal orifice, and rectum were photographed. Scope In: 8:40:28 AM Scope Out: 9:07:20 AM Scope Withdrawal Time: 0 hours 20 minutes 21 seconds  Total Procedure Duration: 0 hours 26 minutes 52 seconds  Findings:      The perianal and digital rectal examinations were normal.      Two sessile polyps were found in the sigmoid colon and cecum. The polyps  were 3 to 6 mm in size. These polyps were removed with a cold snare.       Resection and retrieval were complete. Estimated blood loss was minimal.      A 6 mm polyp was found in the rectum. The polyp was sessile. The polyp       was removed with a cold snare. Resection and retrieval were complete.       Estimated blood loss was minimal.      A few small-mouthed diverticula were found in the sigmoid colon and       descending colon.      A single small angioectasia without bleeding was found at the splenic       flexure.      The retroflexed view of the distal rectum and anal verge was normal and       showed no anal or rectal abnormalities. Impression:               - Two 3 to 6 mm polyps in the sigmoid colon and in                            the cecum,  removed with a cold snare. Resected and                            retrieved.                           - One 6 mm polyp in the rectum, removed with a cold                            snare. Resected and retrieved.                           - Diverticulosis in the sigmoid colon and in the                            descending colon.                           - A single non-bleeding colonic angioectasia.                           - The distal rectum and anal verge are normal on                            retroflexion view. Moderate Sedation:      Not Applicable - Patient had care per Anesthesia. Recommendation:           - Patient has a contact number available for                            emergencies. The signs and symptoms of potential                            delayed complications were discussed with the  patient. Return to normal activities tomorrow.                            Written discharge instructions were provided to the                            patient.                           - Resume previous diet.                           - Continue present medications.                           - Await pathology results.                           - Repeat colonoscopy for surveillance based on                            pathology results.                           - Return to GI office PRN.                           - Resume Xarelto (rivaroxaban) at prior dose in 2                            days. Procedure Code(s):        --- Professional ---                           [phone], Colonoscopy, flexible; with removal of                            tumor(s), polyp(s), or other lesion(s) by snare                            technique Diagnosis Code(s):        --- Professional ---                           Z80.0, Family history of malignant neoplasm of                            digestive organs                           K63.5, Polyp of colon                            K62.1, Rectal polyp                           K55.20, Angiodysplasia of colon without hemorrhage  K57.30, Diverticulosis of large intestine without                            perforation or abscess without bleeding CPT copyright 2019 American Medical Association. All rights reserved. The codes documented in this report are preliminary and upon coder review may  be revised to meet current compliance requirements. Gerrit Heck, MD 01/26/2021 9:19:27 AM Number of Addenda: 0

## 2021-01-26 NOTE — H&P (Signed)
P  Chief Complaint:    Colon cancer screening, family history of colon cancer, GERD, Barrett's esophagus screening   HPI:     Patient is a 75 y.o. female with a history of diabetes, paroxysmal A. fib (on Xarelto), HTN, HLD, antiphospholipid antibody syndrome, asthma, CAD, chronic diastolic CHF, GERD, osteoarthritis, chronic back pain, hypothyroidism, cervical fusion 2015, history of ascending cholangitis 05/2016 with subsequent cholecystectomy 09/2016 presenting to [facility] for EGD and colonoscopy.  Initially seen by me on 11/15/2020, with history as below:  Last colonoscopy was 01/2011 by Dr. Wynetta Emery, and normal with recommendation repeat in 5 years due to strong family history of colon cancer. To the best of her knowledge, no repeat has been performed in the interim. She is otherwise without any GI symptoms.  Multiple family members with Colon Cancer, including sister (diagnosed in her 37's), cousin, great aunt (late 80's) and another first-degree relative with Barrett's Esophagus.  Separately, long standing hx of GERD since her 78's. Avoids eating close to bedtime and inciting foods (carbonated beverages, chocolate, spicy, overeating). Index sxs of nausea/vomiting, regurgitation, HB. No dysphagia. Controlled with Nexium 40 mg, with rare breathrough requiring 2nd dose.   Endoscopic History: -EGD (12/1992, Dr. Fuller Plan): Severe erosive gastritis -EGD/push enteroscopy (05/2010, Dr. Deatra Ina): Normal to proximal jejunum -Colonoscopy (01/2011, Dr. Wynetta Emery): Normal. Repeat 5 years    Review of systems:     No chest pain, no SOB, no fevers, no urinary sx   Past Medical History:  Diagnosis Date  . Allergy    seasonal  . Anemia, iron deficiency   . Antiphospholipid syndrome (HCC)    with hypercoagulable state  . Asthma    extrinsic; moderate, persistant, nml spirometry 2010, nml CXR 1/08  . Atrial fibrillation ([place])   . Bladder troubles    REPORTS INFECTIONS ON OCCASION DUE TO  URETHRA MEATUS STRICTURE AT BIRTH   . CAD (coronary artery disease)    a. 50% mid LAD, 80% ostial D1 and moderate 80% mid circ by cath 2003. b. nonischemic nuc in 2013.  Marland Kitchen Cancer ([place])    basal cell removed fr. L arm   . Carotid arterial disease ([place])    a. 03/2017 showed 1-39% bilateral internal carotid stenosis (high end range on left), >50% LECA stenosis, with recommendation for f/u duplex 03/2018.  Marland Kitchen CHF (congestive heart failure) ([place])   . Clotting disorder ([place])   . Complication of anesthesia    cardiac arrest- in OR, at age 62 (18)y.o. during Scalenotomy in her early 20's  . Diabetes ([place])   . Drug allergy    heparin/lovenox  . DVT (deep venous thrombosis) ([place])   . Erosive gastritis 1994  . Family history of adverse reaction to anesthesia    some family members have had trouble waking up  . GERD (gastroesophageal reflux disease)   . H/O hiatal hernia   . Heart murmur    atrial fib, controlled  . History of colon polyps   . HLD (hyperlipidemia)   . HTN (hypertension)    Pt states her high blood pressure readings are due tot he method of measurementsMcAlhaney at [employer], manages pt.  LOV 03/2014  . Hypothyroidism   . Liver spot    cyst- - no biopsy, but told that its benign   . Moderate mitral regurgitation   . PAF (paroxysmal atrial fibrillation) ([place])   . Pulmonary embolism ([place])    related to back surgery with prior coumadin use, now off  . PVC's (  premature ventricular contractions)    a. noted in 09/2017.  Marland Kitchen Septic shock ([place])   . Spondylosis, lumbosacral    ARTHRITIS- OA   . Urinary incontinence     Patient's surgical history, family medical history, social history, medications and allergies were all reviewed in Epic    Current Facility-Administered Medications  Medication Dose Route Frequency Provider Last Rate Last Admin  . lactated ringers infusion   Intravenous Continuous Yanette Tripoli V, DO        Physical Exam:     BP (!) 196/84   Temp (!) 97.5  F (36.4 C) (Temporal)   Resp (!) 22   Ht 5\' 2"  (1.575 m)   Wt 93.4 kg   SpO2 98%   BMI 37.66 kg/m   GENERAL:  Pleasant female in NAD PSYCH: : Cooperative, normal affect EENT:  conjunctiva pink, mucous membranes moist, neck supple without masses CARDIAC:  RRR, no murmur heard, no peripheral edema PULM: Normal respiratory effort, lungs CTA bilaterally, no wheezing ABDOMEN:  Nondistended, soft, nontender. No obvious masses, no hepatomegaly,  normal bowel sounds SKIN:  turgor, no lesions seen Musculoskeletal:  Normal muscle tone, normal strength NEURO: Alert and oriented x 3, no focal neurologic deficits   IMPRESSION and PLAN:    1) Family history of colon cancer -Colonoscopy today for ongoing screening  2) GERD 3) Family history of Barrett's Esophagus -EGD today for BE screening  -Continue Nexium as prescribed -Continue antireflux lifestyle/dietary modifications -Evaluate for erosive esophagitis, LES laxity, hiatal hernia time of EGD  3) Paroxysmal A. fib 4) Systemic anticoagulation -Holding Xarelto in the perioperative period          Lavena Bullion ,DO, FACG 01/26/2021, 8:12 AM

## 2021-01-27 ENCOUNTER — Encounter: Payer: Self-pay | Admitting: Gastroenterology

## 2021-01-27 LAB — SURGICAL PATHOLOGY

## 2021-01-30 ENCOUNTER — Encounter (HOSPITAL_COMMUNITY): Payer: Self-pay | Admitting: Gastroenterology

## 2021-01-31 DIAGNOSIS — G894 Chronic pain syndrome: Secondary | ICD-10-CM | POA: Diagnosis not present

## 2021-01-31 DIAGNOSIS — M963 Postlaminectomy kyphosis: Secondary | ICD-10-CM | POA: Diagnosis not present

## 2021-01-31 DIAGNOSIS — Z79891 Long term (current) use of opiate analgesic: Secondary | ICD-10-CM | POA: Diagnosis not present

## 2021-01-31 DIAGNOSIS — M4722 Other spondylosis with radiculopathy, cervical region: Secondary | ICD-10-CM | POA: Diagnosis not present

## 2021-02-02 ENCOUNTER — Encounter: Payer: Self-pay | Admitting: Family Medicine

## 2021-02-02 DIAGNOSIS — Z780 Asymptomatic menopausal state: Secondary | ICD-10-CM | POA: Diagnosis not present

## 2021-02-02 LAB — HM DEXA SCAN: HM Dexa Scan: NORMAL

## 2021-02-02 MED ORDER — EPINEPHRINE 0.3 MG/0.3ML IJ SOAJ: 0.3000 mg | Freq: Once | INTRAMUSCULAR | 99 refills | Status: DC

## 2021-02-08 ENCOUNTER — Encounter: Payer: Self-pay | Admitting: Family Medicine

## 2021-02-16 ENCOUNTER — Encounter: Payer: Self-pay | Admitting: Family Medicine

## 2021-03-04 ENCOUNTER — Telehealth: Payer: Self-pay

## 2021-03-04 NOTE — Telephone Encounter (Signed)
Esomeprazole magnesium 40mg  capsule has been approved from 12-28-2020 to 01-27-2022 from [facility]

## 2021-03-06 ENCOUNTER — Other Ambulatory Visit: Payer: Self-pay | Admitting: Family Medicine

## 2021-03-06 ENCOUNTER — Other Ambulatory Visit: Payer: Self-pay | Admitting: Cardiovascular Disease

## 2021-03-10 ENCOUNTER — Other Ambulatory Visit: Payer: Self-pay | Admitting: Cardiovascular Disease

## 2021-03-22 DIAGNOSIS — M4722 Other spondylosis with radiculopathy, cervical region: Secondary | ICD-10-CM | POA: Diagnosis not present

## 2021-03-22 DIAGNOSIS — M963 Postlaminectomy kyphosis: Secondary | ICD-10-CM | POA: Diagnosis not present

## 2021-03-22 DIAGNOSIS — G894 Chronic pain syndrome: Secondary | ICD-10-CM | POA: Diagnosis not present

## 2021-03-22 DIAGNOSIS — Z79891 Long term (current) use of opiate analgesic: Secondary | ICD-10-CM | POA: Diagnosis not present

## 2021-03-31 DIAGNOSIS — Z23 Encounter for immunization: Secondary | ICD-10-CM | POA: Diagnosis not present

## 2021-04-01 NOTE — Progress Notes (Addendum)
[place] [facility], [place], [place] [address] [phone]  Date:  04/06/2021   Name:  Latoya [name]   DOB:  May 12, 1946   MRN:  [id number]  PCP:  Darreld Mclean, MD    Chief Complaint: Diabetes and Hyperlipidemia   History of Present Illness:  Latoya [name] is a 75 y.o. very pleasant female patient who presents with the following:  Patient seen today for a periodic follow-up visit Last seen by myself in December History of diabetes, PAF, HTN, hyperlipidemia, anti-phospholipid antibody syndrome, GERD, asthma, CAD Married to [place], never smoker Anticoagulated with Xarelto Her cardiologist is Dr. Angelena Form Her pain management provider is Nicholaus Bloom  Colonoscopy completed April of this year Mammogram- done in December  Shingrix-  she plans to do this soon  Urine microalbumin is due COVID-19 fourth dose- just done last week Foot exam due- will update today  Dexa 5/22  She is hoping to get back to water aerobics soon  Her knee prevents her from walking as much as she would like  No caludication  Her most recent A1c was elevated, 8%-we started her on metformin at that time She is tolerating this ok- we changed her to the ER and this helped a lot with her loose stools  Patient Active Problem List   Diagnosis Date Noted   Adenomatous polyp of sigmoid colon    Adenomatous polyp of cecum    Rectal polyp    Diverticulosis of colon without hemorrhage    AVM (arteriovenous malformation) of colon without hemorrhage    Family history of colon cancer    Spondylosis, lumbosacral    Pulmonary embolism (HCC)    PAF (paroxysmal atrial fibrillation) ([place])    Moderate mitral regurgitation    H/O hiatal hernia    DVT (deep venous thrombosis) ([place])    Complication of anesthesia    Carotid arterial disease ([place])    Cancer ([place])    Antiphospholipid syndrome ([place])    Anemia, iron deficiency    Diabetes mellitus  without complication ([place]) 68/09/7516   Leukocytosis 08/04/2016   Hypothyroidism 08/04/2016   Anemia of chronic disease 08/04/2016   Gastroparesis    Enterococcal bacteremia 06/06/2016   Bacteremia due to Klebsiella pneumoniae 06/06/2016   Bacteremia due to Escherichia coli 06/06/2016   Ascending cholangitis 06/04/2016   Hemangioma of liver 06/04/2016   Cervical pseudoarthrosis ([place]) 08/31/2014   Chest pain, atypical 04/11/2013   PAD (peripheral artery disease) ([place]) 06/20/2011   BACK PAIN, CHRONIC 06/01/2010   CAD in native artery 11/15/2009   MURMUR 11/15/2009   CAROTID BRUIT, LEFT 11/15/2009   Essential hypertension 03/18/2009   Asthma 03/18/2009   GERD 03/18/2009   Hyperlipidemia 11/29/2008   Gastritis and gastroduodenitis 10/22/1992    Past Medical History:  Diagnosis Date   Allergy    seasonal   Anemia, iron deficiency    Antiphospholipid syndrome ([place])    with hypercoagulable state   Asthma    extrinsic; moderate, persistant, nml spirometry 2010, nml CXR 1/08   Atrial fibrillation ([place])    Bladder troubles    REPORTS INFECTIONS ON OCCASION DUE TO URETHRA MEATUS STRICTURE AT BIRTH    CAD (coronary artery disease)    a. 50% mid LAD, 80% ostial D1 and moderate 80% mid circ by cath 2003. b. nonischemic nuc in 2013.   Cancer ([place])    basal cell removed fr. L arm  Carotid arterial disease ([place])    a. 03/2017 showed 1-39% bilateral internal carotid stenosis (high end range on left), >50% LECA stenosis, with recommendation for f/u duplex 03/2018.   CHF (congestive heart failure) (HCC)    Clotting disorder (HCC)    Complication of anesthesia    cardiac arrest- in OR, at age 78 (40)y.o. during Scalenotomy in her early 20's   Diabetes [facility])    Drug allergy    heparin/lovenox   DVT (deep venous thrombosis) (HCC)    Erosive gastritis 1994   Family history of adverse reaction to anesthesia    some family members have had trouble waking up   GERD (gastroesophageal  reflux disease)    H/O hiatal hernia    Heart murmur    atrial fib, controlled   History of colon polyps    HLD (hyperlipidemia)    HTN (hypertension)    Pt states her high blood pressure readings are due tot he method of measurementsMcAlhaney at [employer], manages pt.  LOV 03/2014   Hypothyroidism    Liver spot    cyst- - no biopsy, but told that its benign    Moderate mitral regurgitation    PAF (paroxysmal atrial fibrillation) ([place])    Pulmonary embolism ([place])    related to back surgery with prior coumadin use, now off   PVC's (premature ventricular contractions)    a. noted in 09/2017.   Septic shock (HCC)    Spondylosis, lumbosacral    ARTHRITIS- OA    Urinary incontinence     Past Surgical History:  Procedure Laterality Date   ABDOMINAL HYSTERECTOMY     partial abdominal -1998   BACK SURGERY     x13 cervical and lumbar thoracic spine surgery   BIOPSY  01/26/2021   Procedure: BIOPSY;  Surgeon: Lavena Bullion, DO;  Location: [facility];  Service: Gastroenterology;;   BREAST ENHANCEMENT SURGERY     BREAST SURGERY     all benign cysts x3    CARDIAC CATHETERIZATION     2005   CHOLECYSTECTOMY N/A 10/04/2016   Procedure: LAPAROSCOPIC CHOLECYSTECTOMY;  Surgeon: Erroll Luna, MD;  Location: [facility];  Service: General;  Laterality: N/A;   cleft lip and palate repair     75 yo    COLONOSCOPY  2012   COLONOSCOPY WITH PROPOFOL N/A 01/26/2021   Procedure: COLONOSCOPY WITH PROPOFOL;  Surgeon: Lavena Bullion, DO;  Location: [facility];  Service: Gastroenterology;  Laterality: N/A;   ESOPHAGOGASTRODUODENOSCOPY (EGD) WITH PROPOFOL N/A 01/26/2021   Procedure: ESOPHAGOGASTRODUODENOSCOPY (EGD) WITH PROPOFOL;  Surgeon: Lavena Bullion, DO;  Location: [facility];  Service: Gastroenterology;  Laterality: N/A;   hysterectomy     IR GENERIC HISTORICAL  06/04/2016   IR PERC CHOLECYSTOSTOMY 06/04/2016 Greggory Keen, MD [facility]   IR GENERIC HISTORICAL  08/06/2016   IR  CHOLANGIOGRAM EXISTING TUBE 08/06/2016 Aletta Edouard, MD [facility]   IR GENERIC HISTORICAL  07/17/2016   IR RADIOLOGIST EVAL & MGMT 07/17/2016 [facility]   JOINT REPLACEMENT     MOHS SURGERY  05/22/2020   right side forehead   POLYPECTOMY  01/26/2021   Procedure: POLYPECTOMY;  Surgeon: Lavena Bullion, DO;  Location: [facility];  Service: Gastroenterology;;   POSTERIOR CERVICAL FUSION/FORAMINOTOMY Right 08/31/2014   Procedure: Right cervical six-seven foraminotomy, Cervical five-Thoracic one fusion, Cervical six-seven lateral mass screws;  Surgeon: Erline Levine, MD;  Location: [facility];  Service: Neurosurgery;  Laterality: Right;   spina bifida repair  75 yo    TONSILLECTOMY     TOTAL KNEE ARTHROPLASTY     left    Social History   Tobacco Use   Smoking status: Never   Smokeless tobacco: Never   Tobacco comments:    no smoking   Vaping Use   Vaping Use: Never used  Substance Use Topics   Alcohol use: No   Drug use: No    Family History  Problem Relation Age of Onset   Heart disease Father        MI    Heart disease Sister    Colon cancer Sister    Colon cancer Cousin        a guy-on father side    Colon cancer Paternal Aunt        great aunt    Colon polyps Neg Hx    Esophageal cancer Neg Hx    Rectal cancer Neg Hx    Stomach cancer Neg Hx     Allergies  Allergen Reactions   Chlorhexidine Anaphylaxis and Itching    Sensitive to dye in Betadine & Chlorohexadine    Enoxaparin Sodium Anaphylaxis   Heparin Anaphylaxis   Hornet Venom Anaphylaxis    European Hornets   Pork-Derived Products Anaphylaxis   Povidone-Iodine Itching and Rash    NOTE: Patient has had anaphylactic reaction to Betadine due to dyes in product. Sensitivity-" but if its wiped off she is able to tolerate betadine"    Indomethacin Rash   Lactose Intolerance (Gi) Diarrhea and Nausea And Vomiting    Can tolerate most [place] cheeses (Takes Lactaid)   Phenylpropanolamine  Hypertension    Medication list has been reviewed and updated.  Current Outpatient Medications on File Prior to Visit  Medication Sig Dispense Refill   montelukast (SINGULAIR) 10 MG tablet Take 1 tablet (10 mg total) by mouth at bedtime. 90 tablet 3   albuterol (PROVENTIL HFA;VENTOLIN HFA) 108 (90 Base) MCG/ACT inhaler Inhale 2 puffs into the lungs every 6 (six) hours as needed for wheezing or shortness of breath. 18 g 5   azelastine (ASTELIN) 0.1 % nasal spray USE 1 TO 2 SPRAYS IN EACH NOSTRIL TWICE A DAY. 30 mL 2   Calcium Carb-Cholecalciferol 500-400 MG-UNIT TABS Take 1 tablet by mouth 2 (two) times daily.     CLARITIN-D 12 HOUR 5-120 MG tablet TAKE 1 TABLET EVERY TWELVE HOURS AS NEEDED. 180 tablet 3   CLENPIQ 10-3.5-12 MG-GM -GM/160ML SOLN TAKE AS DIRECTED FOR COLON PREP.     cyclobenzaprine (FLEXERIL) 10 MG tablet Take 10 mg by mouth 3 (three) times daily as needed for muscle spasms.     cyclobenzaprine (FLEXERIL) 5 MG tablet Take 5 mg by mouth 3 (three) times daily as needed.     docusate sodium (COLACE) 100 MG capsule Take 200 mg by mouth 2 (two) times daily.     esomeprazole (NEXIUM) 40 MG capsule Take 1 capsule (40 mg total) by mouth 2 (two) times daily before a meal. 180 capsule 3   fluticasone (FLOVENT HFA) 110 MCG/ACT inhaler Inhale 1-2 puffs into the lungs 2 (two) times daily. 12 g 12   furosemide (LASIX) 40 MG tablet TAKE 1 TABLET ONCE DAILY. 90 tablet 2   gabapentin (NEURONTIN) 600 MG tablet Take 600-750 mg by mouth at bedtime.      Glucosamine-Chondroitin 750-600 MG TABS Take 2 tablets by mouth 2 (two) times daily.      HYDROmorphone (DILAUDID) 4 MG tablet Take 4 mg by mouth  daily.      levothyroxine (SYNTHROID) 100 MCG tablet TAKE 1 TABLET ONCE DAILY ONE HOUR BEFORE BREAKFAST. 30 tablet 3   Melatonin 5 MG TABS Take 2.5-5 mg by mouth at bedtime as needed.     metFORMIN (GLUCOPHAGE XR) 500 MG 24 hr tablet Take 1 tablet (500 mg total) by mouth in the morning and at bedtime. 180  tablet 3   metoprolol tartrate (LOPRESSOR) 100 MG tablet TAKE 1 TABLET BY MOUTH TWICE DAILY. 180 tablet 3   Multiple Vitamin (MULTIVITAMIN WITH MINERALS) TABS Take 1 tablet by mouth daily.     nitroGLYCERIN (NITROSTAT) 0.4 MG SL tablet DISSOLVE 1 TABLET UNDER TONGUE AS NEEDED FOR CHEST PAIN,MAY REPEAT IN 5 MINUTES FOR 2 DOSES. 25 tablet 5   nystatin (MYCOSTATIN/NYSTOP) powder APPLY TOPICALLY 3 TIMES DAILY. USE AS NEEDED FOR YEAST. 30 g 2   potassium chloride (KLOR-CON) 10 MEQ tablet TAKE 4 TABLETS TWICE DAILY. 240 tablet 6   Respiratory Therapy Supplies (FLUTTER) DEVI Use as directed 1 each 0   rosuvastatin (CRESTOR) 20 MG tablet TAKE 1 TABLET ONCE DAILY. 90 tablet 3   triamcinolone (NASACORT) 55 MCG/ACT nasal inhaler Place 1-2 sprays into the nose at bedtime.      triamcinolone lotion (KENALOG) 0.1 %      XARELTO 20 MG TABS tablet TAKE 1 TABLET ONCE A DAY WITH SUPPER. (Patient taking differently: 20 mg daily with breakfast.) 30 tablet 11   No current facility-administered medications on file prior to visit.    Review of Systems:  As per HPI- otherwise negative.   Physical Examination: Vitals:   04/06/21 1522  BP: 126/86  Pulse: 86  Resp: 18  SpO2: 97%   Vitals:   04/06/21 1522  Weight: 203 lb (92.1 kg)  Height: 5\' 2"  (1.575 m)   Body mass index is 37.13 kg/m. Ideal Body Weight: Weight in (lb) to have BMI = 25: 136.4  GEN: no acute distress.  Obese, looks well  HEENT: Atraumatic, Normocephalic.  Ears and Nose: No external deformity. CV: RRR, No M/G/R. No JVD. No thrill. No extra heart sounds. PULM: CTA B, no wheezes, crackles, rhonchi. No retractions. No resp. distress. No accessory muscle use. ABD: S, NT, ND, +BS. No rebound. No HSM. EXTR: No c/c-both legs are grossly swollen bilaterally, per patient this has not gotten any worse PSYCH: Normally interactive. Conversant.  Foot exam-she has chronic venous stasis in both legs, but no skin breakdown.  Cannot palpate pulses  but she has normal sensation Using walker for ambulation She has a tremor most pronounced in her left hand- it varies depending on the day Writing is esp hard for her   Wt Readings from Last 3 Encounters:  04/06/21 203 lb (92.1 kg)  01/26/21 205 lb 14.6 oz (93.4 kg)  01/16/21 206 lb (93.4 kg)     Assessment and Plan: Hypothyroidism, unspecified type  Hyperlipidemia, unspecified hyperlipidemia type - Plan: Basic metabolic panel  Essential hypertension - Plan: Basic metabolic panel, CBC  Diabetes mellitus without complication ([place]) - Plan: Hemoglobin A1c  CAD in native artery  PAD (peripheral artery disease) ([place])  Following up today Will plan further follow- up pending labs. Blood pressure is under okay control She has no claudication pain from her peripheral arterial disease, no skin breakdown of her feet or legs   This visit occurred during the SARS-CoV-2 public health emergency.  Safety protocols were in place, including screening questions prior to the visit, additional usage of staff PPE, and  extensive cleaning of exam room while observing appropriate contact time as indicated for disinfecting solutions.   Signed Lamar Blinks, MD   Addnd 6/17- received her labs, message to pt  Results for orders placed or performed in visit on 61/51/83  Basic metabolic panel  Result Value Ref Range   Sodium 137 135 - 145 mEq/L   Potassium 4.9 3.5 - 5.1 mEq/L   Chloride 99 96 - 112 mEq/L   CO2 26 19 - 32 mEq/L   Glucose, Bld 177 (H) 70 - 99 mg/dL   BUN 29 (H) 6 - 23 mg/dL   Creatinine, Ser 0.85 0.40 - 1.20 mg/dL   GFR 67.63 >60.00 mL/min   Calcium 10.9 (H) 8.4 - 10.5 mg/dL  CBC  Result Value Ref Range   WBC 8.8 4.0 - 10.5 K/uL   RBC 5.03 3.87 - 5.11 Mil/uL   Platelets 288.0 150.0 - 400.0 K/uL   Hemoglobin 14.6 12.0 - 15.0 g/dL   HCT 43.8 36.0 - 46.0 %   MCV 87.0 78.0 - 100.0 fl   MCHC 33.3 30.0 - 36.0 g/dL   RDW 13.1 11.5 - 15.5 %  Hemoglobin A1c  Result Value Ref  Range   Hgb A1c MFr Bld 8.1 (H) 4.6 - 6.5 %

## 2021-04-03 DIAGNOSIS — Z8349 Family history of other endocrine, nutritional and metabolic diseases: Secondary | ICD-10-CM | POA: Diagnosis not present

## 2021-04-03 DIAGNOSIS — Z1379 Encounter for other screening for genetic and chromosomal anomalies: Secondary | ICD-10-CM | POA: Diagnosis not present

## 2021-04-06 ENCOUNTER — Ambulatory Visit (INDEPENDENT_AMBULATORY_CARE_PROVIDER_SITE_OTHER): Payer: Medicare Other | Admitting: Family Medicine

## 2021-04-06 ENCOUNTER — Other Ambulatory Visit: Payer: Self-pay

## 2021-04-06 ENCOUNTER — Encounter: Payer: Self-pay | Admitting: Family Medicine

## 2021-04-06 VITALS — BP 126/86 | HR 86 | Resp 18 | Ht 62.0 in | Wt 203.0 lb

## 2021-04-06 DIAGNOSIS — E119 Type 2 diabetes mellitus without complications: Secondary | ICD-10-CM | POA: Diagnosis not present

## 2021-04-06 DIAGNOSIS — I2510 Atherosclerotic heart disease of native coronary artery without angina pectoris: Secondary | ICD-10-CM

## 2021-04-06 DIAGNOSIS — E785 Hyperlipidemia, unspecified: Secondary | ICD-10-CM

## 2021-04-06 DIAGNOSIS — E039 Hypothyroidism, unspecified: Secondary | ICD-10-CM

## 2021-04-06 DIAGNOSIS — I739 Peripheral vascular disease, unspecified: Secondary | ICD-10-CM

## 2021-04-06 DIAGNOSIS — I10 Essential (primary) hypertension: Secondary | ICD-10-CM | POA: Diagnosis not present

## 2021-04-06 NOTE — Patient Instructions (Addendum)
Please get your shingles vaccine at your pharmacy at your convenience  I will be in touch with your labs asap   Overall you are looking good  Please see me in about 6 months assuming all is well

## 2021-04-07 ENCOUNTER — Encounter: Payer: Self-pay | Admitting: Family Medicine

## 2021-04-07 ENCOUNTER — Other Ambulatory Visit: Payer: Self-pay | Admitting: Family Medicine

## 2021-04-07 DIAGNOSIS — E039 Hypothyroidism, unspecified: Secondary | ICD-10-CM

## 2021-04-07 LAB — HEMOGLOBIN A1C: Hgb A1c MFr Bld: 8.1 % — ABNORMAL HIGH (ref 4.6–6.5)

## 2021-04-07 LAB — BASIC METABOLIC PANEL
BUN: 29 mg/dL — ABNORMAL HIGH (ref 6–23)
CO2: 26 mEq/L (ref 19–32)
Calcium: 10.9 mg/dL — ABNORMAL HIGH (ref 8.4–10.5)
Chloride: 99 mEq/L (ref 96–112)
Creatinine, Ser: 0.85 mg/dL (ref 0.40–1.20)
GFR: 67.63 mL/min (ref >60.00)
Glucose, Bld: 177 mg/dL — ABNORMAL HIGH (ref 70–99)
Potassium: 4.9 mEq/L (ref 3.5–5.1)
Sodium: 137 mEq/L (ref 135–145)

## 2021-04-07 LAB — CBC
HCT: 43.8 % (ref 36.0–46.0)
Hemoglobin: 14.6 g/dL (ref 12.0–15.0)
MCHC: 33.3 g/dL (ref 30.0–36.0)
MCV: 87 fl (ref 78.0–100.0)
Platelets: 288 10*3/uL (ref 150.0–400.0)
RBC: 5.03 Mil/uL (ref 3.87–5.11)
RDW: 13.1 % (ref 11.5–15.5)
WBC: 8.8 10*3/uL (ref 4.0–10.5)

## 2021-04-07 NOTE — Telephone Encounter (Signed)
Pharmacy comment: We are no longer able to get our previous manufacturer of Levothyroxine 160mcg. We now have [employer].

## 2021-04-07 NOTE — Addendum Note (Signed)
Addended by: Lamar Blinks C on: 04/07/2021 12:53 PM   Modules accepted: Orders

## 2021-04-08 ENCOUNTER — Encounter: Payer: Self-pay | Admitting: Family Medicine

## 2021-04-11 DIAGNOSIS — L570 Actinic keratosis: Secondary | ICD-10-CM | POA: Diagnosis not present

## 2021-04-11 DIAGNOSIS — L814 Other melanin hyperpigmentation: Secondary | ICD-10-CM | POA: Diagnosis not present

## 2021-04-11 DIAGNOSIS — L578 Other skin changes due to chronic exposure to nonionizing radiation: Secondary | ICD-10-CM | POA: Diagnosis not present

## 2021-04-11 DIAGNOSIS — L821 Other seborrheic keratosis: Secondary | ICD-10-CM | POA: Diagnosis not present

## 2021-04-11 DIAGNOSIS — Z85828 Personal history of other malignant neoplasm of skin: Secondary | ICD-10-CM | POA: Diagnosis not present

## 2021-04-11 DIAGNOSIS — L905 Scar conditions and fibrosis of skin: Secondary | ICD-10-CM | POA: Diagnosis not present

## 2021-04-11 DIAGNOSIS — L218 Other seborrheic dermatitis: Secondary | ICD-10-CM | POA: Diagnosis not present

## 2021-04-11 DIAGNOSIS — D1801 Hemangioma of skin and subcutaneous tissue: Secondary | ICD-10-CM | POA: Diagnosis not present

## 2021-04-28 DIAGNOSIS — Z20822 Contact with and (suspected) exposure to covid-19: Secondary | ICD-10-CM | POA: Diagnosis not present

## 2021-05-09 DIAGNOSIS — M963 Postlaminectomy kyphosis: Secondary | ICD-10-CM | POA: Diagnosis not present

## 2021-05-09 DIAGNOSIS — Z79891 Long term (current) use of opiate analgesic: Secondary | ICD-10-CM | POA: Diagnosis not present

## 2021-05-09 DIAGNOSIS — G894 Chronic pain syndrome: Secondary | ICD-10-CM | POA: Diagnosis not present

## 2021-05-09 DIAGNOSIS — M4722 Other spondylosis with radiculopathy, cervical region: Secondary | ICD-10-CM | POA: Diagnosis not present

## 2021-06-20 DIAGNOSIS — R32 Unspecified urinary incontinence: Secondary | ICD-10-CM | POA: Diagnosis not present

## 2021-06-20 DIAGNOSIS — G894 Chronic pain syndrome: Secondary | ICD-10-CM | POA: Diagnosis not present

## 2021-06-20 DIAGNOSIS — Z79891 Long term (current) use of opiate analgesic: Secondary | ICD-10-CM | POA: Diagnosis not present

## 2021-06-20 DIAGNOSIS — M963 Postlaminectomy kyphosis: Secondary | ICD-10-CM | POA: Diagnosis not present

## 2021-06-20 DIAGNOSIS — M150 Primary generalized (osteo)arthritis: Secondary | ICD-10-CM | POA: Diagnosis not present

## 2021-06-20 DIAGNOSIS — M79645 Pain in left finger(s): Secondary | ICD-10-CM | POA: Diagnosis not present

## 2021-06-20 DIAGNOSIS — G4700 Insomnia, unspecified: Secondary | ICD-10-CM | POA: Diagnosis not present

## 2021-06-20 DIAGNOSIS — M25561 Pain in right knee: Secondary | ICD-10-CM | POA: Diagnosis not present

## 2021-06-20 DIAGNOSIS — M62830 Muscle spasm of back: Secondary | ICD-10-CM | POA: Diagnosis not present

## 2021-06-20 DIAGNOSIS — M4722 Other spondylosis with radiculopathy, cervical region: Secondary | ICD-10-CM | POA: Diagnosis not present

## 2021-06-27 DIAGNOSIS — M79642 Pain in left hand: Secondary | ICD-10-CM | POA: Diagnosis not present

## 2021-06-27 DIAGNOSIS — M13842 Other specified arthritis, left hand: Secondary | ICD-10-CM | POA: Diagnosis not present

## 2021-07-11 DIAGNOSIS — M25561 Pain in right knee: Secondary | ICD-10-CM | POA: Diagnosis not present

## 2021-07-11 DIAGNOSIS — M79672 Pain in left foot: Secondary | ICD-10-CM | POA: Diagnosis not present

## 2021-08-01 DIAGNOSIS — M79645 Pain in left finger(s): Secondary | ICD-10-CM | POA: Diagnosis not present

## 2021-08-01 DIAGNOSIS — G894 Chronic pain syndrome: Secondary | ICD-10-CM | POA: Diagnosis not present

## 2021-08-01 DIAGNOSIS — M4722 Other spondylosis with radiculopathy, cervical region: Secondary | ICD-10-CM | POA: Diagnosis not present

## 2021-08-01 DIAGNOSIS — M963 Postlaminectomy kyphosis: Secondary | ICD-10-CM | POA: Diagnosis not present

## 2021-08-10 DIAGNOSIS — D225 Melanocytic nevi of trunk: Secondary | ICD-10-CM | POA: Diagnosis not present

## 2021-08-10 DIAGNOSIS — L570 Actinic keratosis: Secondary | ICD-10-CM | POA: Diagnosis not present

## 2021-08-10 DIAGNOSIS — L821 Other seborrheic keratosis: Secondary | ICD-10-CM | POA: Diagnosis not present

## 2021-08-21 DIAGNOSIS — Z23 Encounter for immunization: Secondary | ICD-10-CM | POA: Diagnosis not present

## 2021-08-28 ENCOUNTER — Ambulatory Visit (INDEPENDENT_AMBULATORY_CARE_PROVIDER_SITE_OTHER): Payer: Medicare Other

## 2021-08-28 VITALS — Ht 62.0 in | Wt 198.0 lb

## 2021-08-28 DIAGNOSIS — Z0000 Encounter for general adult medical examination without abnormal findings: Secondary | ICD-10-CM | POA: Diagnosis not present

## 2021-08-28 NOTE — Progress Notes (Signed)
Subjective:   Latoya [name] is a 75 y.o. female who presents for Medicare Annual (Subsequent) preventive examination.  I connected with Latoya [name] today by telephone and verified that I am speaking with the correct person using two identifiers. Location patient: home Location provider: work Persons participating in the virtual visit: patient, Marine scientist.    I discussed the limitations, risks, security and privacy concerns of performing an evaluation and management service by telephone and the availability of in person appointments. I also discussed with the patient that there may be a patient responsible charge related to this service. The patient expressed understanding and verbally consented to this telephonic visit.    Interactive audio and video telecommunications were attempted between this provider and patient, however failed, due to patient having technical difficulties OR patient did not have access to video capability.  We continued and completed visit with audio only.  Some vital signs may be absent or patient reported.   Time Spent with patient on telephone encounter: 50 minutes   Review of Systems     Cardiac Risk Factors include: advanced age (>71men, >76 women);hypertension;diabetes mellitus;dyslipidemia;obesity (BMI >30kg/m2);sedentary lifestyle     Objective:    Today's Vitals   08/28/21 1502  Weight: 198 lb (89.8 kg)  Height: 5\' 2"  (1.575 m)   Body mass index is 36.21 kg/m.  Advanced Directives 08/28/2021 01/26/2021 06/23/2020 06/22/2019 12/04/2018 06/16/2018 01/28/2017  Does Patient Have a Medical Advance Directive? Yes Yes Yes Yes No Yes Yes  Type of Advance Directive Living will [place];Living will [place];Living will [place];Living will - Living will;Healthcare Power of [place];Living will  Does patient want to make changes to medical advance directive? - - No - Patient declined No -  Patient declined - No - Patient declined Yes (MAU/Ambulatory/Procedural Areas - Information given)  Copy of [place] in Chart? - Yes - validated most recent copy scanned in chart (See row information) No - copy requested Yes - validated most recent copy scanned in chart (See row information) - No - copy requested No - copy requested  Would patient like information on creating a medical advance directive? - - - - Yes (ED - Information included in AVS) - -  Pre-existing out of facility DNR order (yellow form or pink MOST form) - - - - - - -    Current Medications (verified) Outpatient Encounter Medications as of 08/28/2021  Medication Sig   albuterol (PROVENTIL HFA;VENTOLIN HFA) 108 (90 Base) MCG/ACT inhaler Inhale 2 puffs into the lungs every 6 (six) hours as needed for wheezing or shortness of breath.   azelastine (ASTELIN) 0.1 % nasal spray USE 1 TO 2 SPRAYS IN EACH NOSTRIL TWICE A DAY.   Calcium Carb-Cholecalciferol 500-400 MG-UNIT TABS Take 1 tablet by mouth 2 (two) times daily.   CLARITIN-D 12 HOUR 5-120 MG tablet TAKE 1 TABLET EVERY TWELVE HOURS AS NEEDED.   CLENPIQ 10-3.5-12 MG-GM -GM/160ML SOLN TAKE AS DIRECTED FOR COLON PREP.   cyclobenzaprine (FLEXERIL) 10 MG tablet Take 10 mg by mouth 3 (three) times daily as needed for muscle spasms.   cyclobenzaprine (FLEXERIL) 5 MG tablet Take 5 mg by mouth 3 (three) times daily as needed.   docusate sodium (COLACE) 100 MG capsule Take 200 mg by mouth 2 (two) times daily.   esomeprazole (NEXIUM) 40 MG capsule Take 1 capsule (40 mg total) by mouth 2 (two) times daily before a meal.  fluticasone (FLOVENT HFA) 110 MCG/ACT inhaler Inhale 1-2 puffs into the lungs 2 (two) times daily.   furosemide (LASIX) 40 MG tablet TAKE 1 TABLET ONCE DAILY.   gabapentin (NEURONTIN) 600 MG tablet Take 600-750 mg by mouth at bedtime.    Glucosamine-Chondroitin 750-600 MG TABS Take 2 tablets by mouth 2 (two) times daily.    HYDROmorphone (DILAUDID) 4  MG tablet Take 4 mg by mouth daily.    levothyroxine (SYNTHROID) 100 MCG tablet TAKE 1 TABLET ONCE DAILY ONE HOUR BEFORE BREAKFAST.   Melatonin 5 MG TABS Take 2.5-5 mg by mouth at bedtime as needed.   metFORMIN (GLUCOPHAGE XR) 500 MG 24 hr tablet Take 1 tablet (500 mg total) by mouth in the morning and at bedtime.   metoprolol tartrate (LOPRESSOR) 100 MG tablet TAKE 1 TABLET BY MOUTH TWICE DAILY.   montelukast (SINGULAIR) 10 MG tablet Take 1 tablet (10 mg total) by mouth at bedtime.   Multiple Vitamin (MULTIVITAMIN WITH MINERALS) TABS Take 1 tablet by mouth daily.   nitroGLYCERIN (NITROSTAT) 0.4 MG SL tablet DISSOLVE 1 TABLET UNDER TONGUE AS NEEDED FOR CHEST PAIN,MAY REPEAT IN 5 MINUTES FOR 2 DOSES.   nystatin (MYCOSTATIN/NYSTOP) powder APPLY TOPICALLY 3 TIMES DAILY. USE AS NEEDED FOR YEAST.   potassium chloride (KLOR-CON) 10 MEQ tablet TAKE 4 TABLETS TWICE DAILY.   Respiratory Therapy Supplies (FLUTTER) DEVI Use as directed   rosuvastatin (CRESTOR) 20 MG tablet TAKE 1 TABLET ONCE DAILY.   triamcinolone (NASACORT) 55 MCG/ACT nasal inhaler Place 1-2 sprays into the nose at bedtime.    triamcinolone lotion (KENALOG) 0.1 %    XARELTO 20 MG TABS tablet TAKE 1 TABLET ONCE A DAY WITH SUPPER. (Patient taking differently: 20 mg daily with breakfast.)   No facility-administered encounter medications on file as of 08/28/2021.    Allergies (verified) Chlorhexidine, Enoxaparin sodium, Heparin, Hornet venom, Pork-derived products, Povidone-iodine, Indomethacin, Lactose intolerance (gi), and Phenylpropanolamine   History: Past Medical History:  Diagnosis Date   Allergy    seasonal   Anemia, iron deficiency    Antiphospholipid syndrome ([place])    with hypercoagulable state   Asthma    extrinsic; moderate, persistant, nml spirometry 2010, nml CXR 1/08   Atrial fibrillation (HCC)    Bladder troubles    REPORTS INFECTIONS ON OCCASION DUE TO URETHRA MEATUS STRICTURE AT BIRTH    CAD (coronary artery  disease)    a. 50% mid LAD, 80% ostial D1 and moderate 80% mid circ by cath 2003. b. nonischemic nuc in 2013.   Cancer ([place])    basal cell removed fr. L arm    Carotid arterial disease ([employer])    a. 03/2017 showed 1-39% bilateral internal carotid stenosis (high end range on left), >50% LECA stenosis, with recommendation for f/u duplex 03/2018.   CHF (congestive heart failure) (HCC)    Clotting disorder (HCC)    Complication of anesthesia    cardiac arrest- in OR, at age 15 (61)y.o. during Scalenotomy in her early 20's   Diabetes [facility])    Drug allergy    heparin/lovenox   DVT (deep venous thrombosis) (HCC)    Erosive gastritis 1994   Family history of adverse reaction to anesthesia    some family members have had trouble waking up   GERD (gastroesophageal reflux disease)    H/O hiatal hernia    Heart murmur    atrial fib, controlled   History of colon polyps    HLD (hyperlipidemia)    HTN (hypertension)  Pt states her high blood pressure readings are due tot he method of [facility] at [employer], manages pt.  LOV 03/2014   Hypothyroidism    Liver spot    cyst- - no biopsy, but told that its benign    Moderate mitral regurgitation    PAF (paroxysmal atrial fibrillation) ([place])    Pulmonary embolism ([place])    related to back surgery with prior coumadin use, now off   PVC's (premature ventricular contractions)    a. noted in 09/2017.   Septic shock (HCC)    Spondylosis, lumbosacral    ARTHRITIS- OA    Urinary incontinence    Past Surgical History:  Procedure Laterality Date   ABDOMINAL HYSTERECTOMY     partial abdominal -1998   BACK SURGERY     x13 cervical and lumbar thoracic spine surgery   BIOPSY  01/26/2021   Procedure: BIOPSY;  Surgeon: Lavena Bullion, DO;  Location: [facility];  Service: Gastroenterology;;   BREAST ENHANCEMENT SURGERY     BREAST SURGERY     all benign cysts x3    CARDIAC CATHETERIZATION     2005   CHOLECYSTECTOMY N/A 10/04/2016    Procedure: LAPAROSCOPIC CHOLECYSTECTOMY;  Surgeon: Erroll Luna, MD;  Location: [facility];  Service: General;  Laterality: N/A;   cleft lip and palate repair     75 yo    COLONOSCOPY  2012   COLONOSCOPY WITH PROPOFOL N/A 01/26/2021   Procedure: COLONOSCOPY WITH PROPOFOL;  Surgeon: Lavena Bullion, DO;  [facility];  Service: Gastroenterology;  Laterality: N/A;   ESOPHAGOGASTRODUODENOSCOPY (EGD) WITH PROPOFOL N/A 01/26/2021   Procedure: ESOPHAGOGASTRODUODENOSCOPY (EGD) WITH PROPOFOL;  Surgeon: Lavena Bullion, DO;  [facility];  Service: Gastroenterology;  Laterality: N/A;   hysterectomy     IR GENERIC HISTORICAL  06/04/2016   IR PERC CHOLECYSTOSTOMY 06/04/2016 Greggory Keen, MD [facility]   IR GENERIC HISTORICAL  08/06/2016   IR CHOLANGIOGRAM EXISTING TUBE 08/06/2016 Aletta Edouard, MD [facility]   IR GENERIC HISTORICAL  07/17/2016   IR RADIOLOGIST EVAL & MGMT 07/17/2016 [facility]   JOINT REPLACEMENT     MOHS SURGERY  05/22/2020   right side forehead   POLYPECTOMY  01/26/2021   Procedure: POLYPECTOMY;  Surgeon: Lavena Bullion, DO;  [facility];  Service: Gastroenterology;;   POSTERIOR CERVICAL FUSION/FORAMINOTOMY Right 08/31/2014   Procedure: Right cervical six-seven foraminotomy, Cervical five-Thoracic one fusion, Cervical six-seven lateral mass screws;  Surgeon: Erline Levine, MD;  Location: [facility];  Service: Neurosurgery;  Laterality: Right;   spina bifida repair     75 yo    TONSILLECTOMY     TOTAL KNEE ARTHROPLASTY     left   Family History  Problem Relation Age of Onset   Heart disease Father        MI    Heart disease Sister    Colon cancer Sister    Colon cancer Cousin        a guy-on father side    Colon cancer Paternal Aunt        great aunt    Colon polyps Neg Hx    Esophageal cancer Neg Hx    Rectal cancer Neg Hx    Stomach cancer Neg Hx    Social History   Socioeconomic History   Marital status: Married     Spouse name: Not on file   Number of children: Not on file   Years of education: Not on file  Highest education level: Not on file  Occupational History   Not on file  Tobacco Use   Smoking status: Never   Smokeless tobacco: Never   Tobacco comments:    no smoking   Vaping Use   Vaping Use: Never used  Substance and Sexual Activity   Alcohol use: No   Drug use: No   Sexual activity: Not Currently  Other Topics Concern   Not on file  Social History Narrative   Lives in [place] with husband; has had miscarriages but no children.    Disabled but exercises nearly every day (can only exericse in water - aerobics)   Takes opioids for pain relief; takes no herbal medications; has a heart healthy diet.    Social Determinants of Health   Financial Resource Strain: Low Risk    Difficulty of Paying Living Expenses: Not hard at all  Food Insecurity: No Food Insecurity   Worried About Charity fundraiser in the Last Year: Never true   [place] in the Last Year: Never true  Transportation Needs: No Transportation Needs   Lack of Transportation (Medical): No   Lack of Transportation (Non-Medical): No  Physical Activity: Inactive   Days of Exercise per Week: 0 days   Minutes of Exercise per Session: 0 min  Stress: No Stress Concern Present   Feeling of Stress : Not at all  Social Connections: Not on file    Tobacco Counseling Counseling given: Not Answered Tobacco comments: no smoking    Clinical Intake:  Pre-visit preparation completed: Yes  Pain : No/denies pain     BMI - recorded: 36.21 Nutritional Status: BMI > 30  Obese Nutritional Risks: None Diabetes: Yes CBG done?: No Did pt. bring in CBG monitor from home?: No  How often do you need to have someone help you when you read instructions, pamphlets, or other written materials from your doctor or pharmacy?: 1 - Never  Diabetes:  Is the patient diabetic?  Yes  If diabetic, was a CBG obtained today?  No  Did  the patient bring in their glucometer from home?  No phone visit How often do you monitor your CBG's? never.   Financial Strains and Diabetes Management:  Are you having any financial strains with the device, your supplies or your medication? No .  Does the patient want to be seen by Chronic Care Management for management of their diabetes?  No  Would the patient like to be referred to a Nutritionist or for Diabetic Management?  No   Diabetic Exams:  Diabetic Eye Exam:. Overdue for diabetic eye exam. Pt has been advised about the importance in completing this exam. Patient plans to make an appt soon  Diabetic Foot Exam: Completed 04/06/2021.    Interpreter Needed?: No  Information entered by :: Caroleen Hamman LPN   Activities of Daily Living In your present state of health, do you have any difficulty performing the following activities: 08/28/2021  Hearing? N  Vision? N  Difficulty concentrating or making decisions? N  Walking or climbing stairs? Y  Dressing or bathing? N  Doing errands, shopping? N  Preparing Food and eating ? N  Using the Toilet? N  In the past six months, have you accidently leaked urine? Y  Do you have problems with loss of bowel control? N  Managing your Medications? N  Managing your Finances? N  Housekeeping or managing your Housekeeping? N  Some recent data might be hidden  Patient Care Team: Copland, Gay Filler, MD as PCP - General (Family Medicine) Burnell Blanks, MD as PCP - Cardiology (Cardiology) Nicholaus Bloom, MD as Consulting Physician (Anesthesiology) Erline Levine, MD as Consulting Physician (Neurosurgery) Mosetta Anis, MD as Consulting Physician (Allergy) Tanda Rockers, MD as Consulting Physician (Pulmonary Disease) Burnell Blanks, MD as Consulting Physician (Cardiology) Macarthur Critchley, [place] as Consulting Physician (Optometry) Druscilla Brownie, MD as Consulting Physician (Dermatology) Dionne Milo, Rockney Ghee, MD as  Consulting Physician (Hematology and Oncology)  Indicate any recent Medical Services you may have received from other than [facility] providers in the past year (date may be approximate).     Assessment:   This is a routine wellness examination for Latoya [name].  Hearing/Vision screen Hearing Screening - Comments:: No issues Vision Screening - Comments:: Last eye exam-07/2020-Dr. Nicki Reaper  Dietary issues and exercise activities discussed: Current Exercise Habits: The patient does not participate in regular exercise at present   Goals Addressed             This Visit's Progress    Increase physical activity   Not on track    Return to water aerobics tolerated.       Depression Screen PHQ 2/9 Scores 08/28/2021 06/23/2020 06/22/2019 06/16/2018 04/16/2018 01/28/2017  PHQ - 2 Score 0 0 0 0 0 0    Fall Risk Fall Risk  08/28/2021 06/23/2020 06/22/2019 06/16/2018 01/28/2017  Falls in the past year? 0 0 0 No No  Number falls in past yr: 0 0 - - -  Injury with Fall? 0 0 - - -  Follow up Falls prevention discussed Education provided;Falls prevention discussed - - -    FALL RISK PREVENTION PERTAINING TO THE HOME:  Any stairs in or around the home? Yes  If so, are there any without handrails? No  Home free of loose throw rugs in walkways, pet beds, electrical cords, etc? Yes  Adequate lighting in your home to reduce risk of falls? Yes   ASSISTIVE DEVICES UTILIZED TO PREVENT FALLS:  Life alert? No  Use of a cane, walker or w/c? Yes -walker Grab bars in the bathroom? Yes  Shower chair or bench in shower? Yes  Elevated toilet seat or a handicapped toilet? Yes   TIMED UP AND GO:  Was the test performed? No . Phone visit   Cognitive Function:Normal cognitive status assessed by this Nurse Health Advisor. No abnormalities found.   MMSE - Mini Mental State Exam 06/16/2018 01/28/2017  Orientation to time 5 5  Orientation to Place 5 5  Registration 3 3  Attention/ Calculation 5 5  Recall 3 3  Language-  name 2 objects 2 2  Language- repeat 1 1  Language- follow 3 step command 3 3  Language- read & follow direction 1 1  Write a sentence 1 1  Copy design 1 1  Total score 30 30        Immunizations Immunization History  Administered Date(s) Administered   Influenza Split 07/22/2016   Influenza, High Dose Seasonal PF 07/23/2019   Influenza-Unspecified 07/22/2013, 08/22/2017, 07/22/2020, 08/21/2021   PFIZER(Purple Top)SARS-COV-2 Vaccination 12/05/2019, 12/28/2019, 07/22/2020, 03/31/2021   Pneumococcal Conjugate-13 08/09/2014   Pneumococcal-Unspecified 07/22/2013   Tdap 01/28/2017    TDAP status: Up to date  Flu Vaccine status: Up to date  Pneumococcal vaccine status: Up to date  Covid-19 vaccine status: Information provided on how to obtain vaccines.   Qualifies for Shingles Vaccine? Yes   Zostavax completed No   Shingrix Completed?: No.  Education has been provided regarding the importance of this vaccine. Patient has been advised to call insurance company to determine out of pocket expense if they have not yet received this vaccine. Advised may also receive vaccine at local pharmacy or Health Dept. Verbalized acceptance and understanding.  Screening Tests Health Maintenance  Topic Date Due   Zoster Vaccines- Shingrix (1 of 2) Never done   Pneumonia Vaccine 28+ Years old (2 - PPSV23 if available, else PCV20) 08/10/2015   URINE MICROALBUMIN  10/07/2020   COVID-19 Vaccine (5 - Booster for Pfizer series) 05/26/2021   OPHTHALMOLOGY EXAM  08/30/2021   HEMOGLOBIN A1C  10/06/2021   FOOT EXAM  04/06/2022   MAMMOGRAM  10/11/2022   TETANUS/TDAP  01/29/2027   COLONOSCOPY (Pts 45-44yrs Insurance coverage will need to be confirmed)  01/27/2031   INFLUENZA VACCINE  Completed   DEXA SCAN  Completed   Hepatitis C Screening  Completed   HPV VACCINES  Aged Out    Health Maintenance  Health Maintenance Due  Topic Date Due   Zoster Vaccines- Shingrix (1 of 2) Never done    Pneumonia Vaccine 48+ Years old (2 - PPSV23 if available, else PCV20) 08/10/2015   URINE MICROALBUMIN  10/07/2020   COVID-19 Vaccine (5 - Booster for [place] series) 05/26/2021    Colorectal cancer screening: Type of screening: Colonoscopy. Completed 01/26/2021. Repeat every 5 years per patient years  Mammogram status: Completed 10/11/2020-bilateral. Repeat every year  Bone Density status: Completed 02/02/2021. Results reflect: Bone density results: NORMAL. Repeat every 2 years.  Lung Cancer Screening: (Low Dose CT Chest recommended if Age 53-80 years, 30 pack-year currently smoking OR have quit w/in 15years.) does not qualify.     Additional Screening:  Hepatitis C Screening: Completed 11/21/2016  Vision Screening: Recommended annual ophthalmology exams for early detection of glaucoma and other disorders of the eye. Is the patient up to date with their annual eye exam?  No  Who is the provider or what is the name of the office in which the patient attends annual eye exams? Dr. Nicki Reaper   Dental Screening: Recommended annual dental exams for proper oral hygiene  Community Resource Referral / Chronic Care Management: CRR required this visit?  No   CCM required this visit?  No      Plan:     I have personally reviewed and noted the following in the patient's chart:   Medical and social history Use of alcohol, tobacco or illicit drugs  Current medications and supplements including opioid prescriptions.  Functional ability and status Nutritional status Physical activity Advanced directives List of other physicians Hospitalizations, surgeries, and ER visits in previous 12 months Vitals Screenings to include cognitive, depression, and falls Referrals and appointments  In addition, I have reviewed and discussed with patient certain preventive protocols, quality metrics, and best practice recommendations. A written personalized care plan for preventive services as well as general  preventive health recommendations were provided to patient.   Due to this being a telephonic visit, the after visit summary with patients personalized plan was offered to patient via mail or my-chart.  Patient would like to access on my-chart.   Marta Antu, LPN   28/12/1515  Nurse Health Advisor  Nurse Notes: None

## 2021-08-28 NOTE — Patient Instructions (Addendum)
Latoya [name] , Thank you for taking time to complete your Medicare Wellness Visit. I appreciate your ongoing commitment to your health goals. Please review the following plan we discussed and let me know if I can assist you in the future.   Screening recommendations/referrals: Colonoscopy: Completed  01/26/2021-Follow GI recommendations for follow up. Mammogram: Completed 10/11/2020-Due 10/11/2021 Bone Density: Completed 02/02/2021-Due 02/03/2023 Recommended yearly ophthalmology/optometry visit for glaucoma screening and checkup Recommended yearly dental visit for hygiene and checkup  Vaccinations: Influenza vaccine: Up to date Pneumococcal vaccine: Up to date Tdap vaccine: Up to date-Due-01/29/2027 Shingles vaccine: Discuss with pharmacy   Covid-19:Booster available at the pharmacy  Advanced directives: Copy in chart  Conditions/risks identified: See problem list  Next appointment: Follow up in one year for your annual wellness visit    Preventive Care 65 Years and Older, Female Preventive care refers to lifestyle choices and visits with your health care provider that can promote health and wellness. What does preventive care include? A yearly physical exam. This is also called an annual well check. Dental exams once or twice a year. Routine eye exams. Ask your health care provider how often you should have your eyes checked. Personal lifestyle choices, including: Daily care of your teeth and gums. Regular physical activity. Eating a healthy diet. Avoiding tobacco and drug use. Limiting alcohol use. Practicing safe sex. Taking low-dose aspirin every day. Taking vitamin and mineral supplements as recommended by your health care provider. What happens during an annual well check? The services and screenings done by your health care provider during your annual well check will depend on your age, overall health, lifestyle risk factors, and family history of disease. Counseling  Your  health care provider may ask you questions about your: Alcohol use. Tobacco use. Drug use. Emotional well-being. Home and relationship well-being. Sexual activity. Eating habits. History of falls. Memory and ability to understand (cognition). Work and work Statistician. Reproductive health. Screening  You may have the following tests or measurements: Height, weight, and BMI. Blood pressure. Lipid and cholesterol levels. These may be checked every 5 years, or more frequently if you are over 35 years old. Skin check. Lung cancer screening. You may have this screening every year starting at age 25 if you have a 30-pack-year history of smoking and currently smoke or have quit within the past 15 years. Fecal occult blood test (FOBT) of the stool. You may have this test every year starting at age 4. Flexible sigmoidoscopy or colonoscopy. You may have a sigmoidoscopy every 5 years or a colonoscopy every 10 years starting at age 104. Hepatitis C blood test. Hepatitis B blood test. Sexually transmitted disease (STD) testing. Diabetes screening. This is done by checking your blood sugar (glucose) after you have not eaten for a while (fasting). You may have this done every 1-3 years. Bone density scan. This is done to screen for osteoporosis. You may have this done starting at age 63. Mammogram. This may be done every 1-2 years. Talk to your health care provider about how often you should have regular mammograms. Talk with your health care provider about your test results, treatment options, and if necessary, the need for more tests. Vaccines  Your health care provider may recommend certain vaccines, such as: Influenza vaccine. This is recommended every year. Tetanus, diphtheria, and acellular pertussis (Tdap, Td) vaccine. You may need a Td booster every 10 years. Zoster vaccine. You may need this after age 37. Pneumococcal 13-valent conjugate (PCV13) vaccine. One dose is recommended  after age  61. Pneumococcal polysaccharide (PPSV23) vaccine. One dose is recommended after age 62. Talk to your health care provider about which screenings and vaccines you need and how often you need them. This information is not intended to replace advice given to you by your health care provider. Make sure you discuss any questions you have with your health care provider. Document Released: 11/04/2015 Document Revised: 06/27/2016 Document Reviewed: 08/09/2015 Elsevier Interactive Patient Education  2017 [place] Prevention in the Home Falls can cause injuries. They can happen to people of all ages. There are many things you can do to make your home safe and to help prevent falls. What can I do on the outside of my home? Regularly fix the edges of walkways and driveways and fix any cracks. Remove anything that might make you trip as you walk through a door, such as a raised step or threshold. Trim any bushes or trees on the path to your home. Use bright outdoor lighting. Clear any walking paths of anything that might make someone trip, such as rocks or tools. Regularly check to see if handrails are loose or broken. Make sure that both sides of any steps have handrails. Any raised decks and porches should have guardrails on the edges. Have any leaves, snow, or ice cleared regularly. Use sand or salt on walking paths during winter. Clean up any spills in your garage right away. This includes oil or grease spills. What can I do in the bathroom? Use night lights. Install grab bars by the toilet and in the tub and shower. Do not use towel bars as grab bars. Use non-skid mats or decals in the tub or shower. If you need to sit down in the shower, use a plastic, non-slip stool. Keep the floor dry. Clean up any water that spills on the floor as soon as it happens. Remove soap buildup in the tub or shower regularly. Attach bath mats securely with double-sided non-slip rug tape. Do not have throw  rugs and other things on the floor that can make you trip. What can I do in the bedroom? Use night lights. Make sure that you have a light by your bed that is easy to reach. Do not use any sheets or blankets that are too big for your bed. They should not hang down onto the floor. Have a firm chair that has side arms. You can use this for support while you get dressed. Do not have throw rugs and other things on the floor that can make you trip. What can I do in the kitchen? Clean up any spills right away. Avoid walking on wet floors. Keep items that you use a lot in easy-to-reach places. If you need to reach something above you, use a strong step stool that has a grab bar. Keep electrical cords out of the way. Do not use floor polish or wax that makes floors slippery. If you must use wax, use non-skid floor wax. Do not have throw rugs and other things on the floor that can make you trip. What can I do with my stairs? Do not leave any items on the stairs. Make sure that there are handrails on both sides of the stairs and use them. Fix handrails that are broken or loose. Make sure that handrails are as long as the stairways. Check any carpeting to make sure that it is firmly attached to the stairs. Fix any carpet that is loose or worn. Avoid having throw  rugs at the top or bottom of the stairs. If you do have throw rugs, attach them to the floor with carpet tape. Make sure that you have a light switch at the top of the stairs and the bottom of the stairs. If you do not have them, ask someone to add them for you. What else can I do to help prevent falls? Wear shoes that: Do not have high heels. Have rubber bottoms. Are comfortable and fit you well. Are closed at the toe. Do not wear sandals. If you use a stepladder: Make sure that it is fully opened. Do not climb a closed stepladder. Make sure that both sides of the stepladder are locked into place. Ask someone to hold it for you, if  possible. Clearly mark and make sure that you can see: Any grab bars or handrails. First and last steps. Where the edge of each step is. Use tools that help you move around (mobility aids) if they are needed. These include: Canes. Walkers. Scooters. Crutches. Turn on the lights when you go into a dark area. Replace any light bulbs as soon as they burn out. Set up your furniture so you have a clear path. Avoid moving your furniture around. If any of your floors are uneven, fix them. If there are any pets around you, be aware of where they are. Review your medicines with your doctor. Some medicines can make you feel dizzy. This can increase your chance of falling. Ask your doctor what other things that you can do to help prevent falls. This information is not intended to replace advice given to you by your health care provider. Make sure you discuss any questions you have with your health care provider. Document Released: 08/04/2009 Document Revised: 03/15/2016 Document Reviewed: 11/12/2014 Elsevier Interactive Patient Education  2017 [employer].

## 2021-08-30 DIAGNOSIS — Z23 Encounter for immunization: Secondary | ICD-10-CM | POA: Diagnosis not present

## 2021-09-01 ENCOUNTER — Other Ambulatory Visit: Payer: Self-pay | Admitting: Family Medicine

## 2021-09-01 DIAGNOSIS — E039 Hypothyroidism, unspecified: Secondary | ICD-10-CM

## 2021-09-01 DIAGNOSIS — Z20828 Contact with and (suspected) exposure to other viral communicable diseases: Secondary | ICD-10-CM | POA: Diagnosis not present

## 2021-09-11 DIAGNOSIS — M7662 Achilles tendinitis, left leg: Secondary | ICD-10-CM | POA: Diagnosis not present

## 2021-09-11 DIAGNOSIS — M898X7 Other specified disorders of bone, ankle and foot: Secondary | ICD-10-CM | POA: Diagnosis not present

## 2021-09-11 DIAGNOSIS — M6702 Short Achilles tendon (acquired), left ankle: Secondary | ICD-10-CM | POA: Diagnosis not present

## 2021-09-11 DIAGNOSIS — M79672 Pain in left foot: Secondary | ICD-10-CM | POA: Diagnosis not present

## 2021-09-12 DIAGNOSIS — M4722 Other spondylosis with radiculopathy, cervical region: Secondary | ICD-10-CM | POA: Diagnosis not present

## 2021-09-12 DIAGNOSIS — M963 Postlaminectomy kyphosis: Secondary | ICD-10-CM | POA: Diagnosis not present

## 2021-09-12 DIAGNOSIS — M79645 Pain in left finger(s): Secondary | ICD-10-CM | POA: Diagnosis not present

## 2021-09-12 DIAGNOSIS — G894 Chronic pain syndrome: Secondary | ICD-10-CM | POA: Diagnosis not present

## 2021-09-22 DIAGNOSIS — H2513 Age-related nuclear cataract, bilateral: Secondary | ICD-10-CM | POA: Diagnosis not present

## 2021-09-22 LAB — HM DIABETES EYE EXAM

## 2021-09-25 ENCOUNTER — Encounter: Payer: Self-pay | Admitting: Family Medicine

## 2021-09-26 DIAGNOSIS — M25572 Pain in left ankle and joints of left foot: Secondary | ICD-10-CM | POA: Diagnosis not present

## 2021-10-03 ENCOUNTER — Other Ambulatory Visit: Payer: Self-pay

## 2021-10-03 ENCOUNTER — Ambulatory Visit (INDEPENDENT_AMBULATORY_CARE_PROVIDER_SITE_OTHER): Payer: Medicare Other | Admitting: Cardiovascular Disease

## 2021-10-03 ENCOUNTER — Encounter: Payer: Self-pay | Admitting: Cardiovascular Disease

## 2021-10-03 VITALS — BP 130/84 | HR 100 | Ht 62.0 in | Wt 198.0 lb

## 2021-10-03 DIAGNOSIS — E785 Hyperlipidemia, unspecified: Secondary | ICD-10-CM

## 2021-10-03 DIAGNOSIS — M79605 Pain in left leg: Secondary | ICD-10-CM

## 2021-10-03 DIAGNOSIS — I5032 Chronic diastolic (congestive) heart failure: Secondary | ICD-10-CM | POA: Diagnosis not present

## 2021-10-03 DIAGNOSIS — I480 Paroxysmal atrial fibrillation: Secondary | ICD-10-CM | POA: Diagnosis not present

## 2021-10-03 DIAGNOSIS — I6523 Occlusion and stenosis of bilateral carotid arteries: Secondary | ICD-10-CM

## 2021-10-03 DIAGNOSIS — M79604 Pain in right leg: Secondary | ICD-10-CM

## 2021-10-03 DIAGNOSIS — I10 Essential (primary) hypertension: Secondary | ICD-10-CM

## 2021-10-03 DIAGNOSIS — I2510 Atherosclerotic heart disease of native coronary artery without angina pectoris: Secondary | ICD-10-CM | POA: Diagnosis not present

## 2021-10-03 NOTE — Patient Instructions (Signed)
Medication Instructions:  No changes *If you need a refill on your cardiac medications before your next appointment, please call your pharmacy*   Lab Work: None  Testing/Procedures: Your physician has requested that you have a carotid duplex. This test is an ultrasound of the carotid arteries in your neck. It looks at blood flow through these arteries that supply the brain with blood. Allow one hour for this exam. There are no restrictions or special instructions.  Your physician has requested that you have a lower extremity arterial duplex. This test is an ultrasound of the arteries in the legs. It looks at arterial blood flow in the legs. Allow one hour for Lower Arterial scans. There are no restrictions or special instructions  Your physician has requested that you have an ankle brachial index (ABI). During this test an ultrasound and blood pressure cuff are used to evaluate the arteries that supply the arms and legs with blood. Allow thirty minutes for this exam. There are no restrictions or special instructions.   Follow-Up: At [facility], you and your health needs are our priority.  As part of our continuing mission to provide you with exceptional heart care, we have created designated Provider Care Teams.  These Care Teams include your primary Cardiologist (physician) and Advanced Practice Providers (APPs -  Physician Assistants and Nurse Practitioners) who all work together to provide you with the care you need, when you need it.   Your next appointment:   12 month(s)  The format for your next appointment:   In Person  Provider:   Lauree Chandler, MD     Other Instructions Pt will call to schedule vascular studies when she can look at her PT schedule which is at home.

## 2021-10-03 NOTE — Progress Notes (Signed)
Chief Complaint  Patient presents with   Follow-up    CAD   History of Present Illness: 75 yo female with history of PAF, HTN, hyperlipidemia, anti-phospholipid antibody syndrome, GERD, asthma, CAD here today for cardiac follow up. Cardiac cath in 2003 with moderate CAD (30% ostial LM stenosis, 50% mid LAD, 80% moderate sized Diagonal, 80% Circumflex). She has had no cath since then. She has chronic back pain. She has a hypercoagulable state and is on Xarelto. Most recent stress test October 2013 with no ischemia, normal LV function.  Last echo December 2018 with normal LV systolic function, grade 2 diastolic dysfunction and trivial mitral regurgitation. Carotid artery dopplers June 2021 with stable mild bilateral stenosis. She was admitted to [facility] August 2017 with sepsis, ascending cholangitis complicated by respiratory failure, renal failure, CHF.  She underwent lap cholecystectomy 10/04/16. When seen in our office in December 2018, her BP was elevated and PVCs were present. Metoprolol was increased to 100 mg BID.   She is here today for follow up. The patient denies any chest pain, dyspnea, palpitations, lower extremity edema, orthopnea, PND, dizziness, near syncope or syncope.   Primary Care Physician: Darreld Mclean, MD  Past Medical History:  Diagnosis Date   Allergy    seasonal   Anemia, iron deficiency    Antiphospholipid syndrome ([place])    with hypercoagulable state   Asthma    extrinsic; moderate, persistant, nml spirometry 2010, nml CXR 1/08   Atrial fibrillation (HCC)    Bladder troubles    REPORTS INFECTIONS ON OCCASION DUE TO URETHRA MEATUS STRICTURE AT BIRTH    CAD (coronary artery disease)    a. 50% mid LAD, 80% ostial D1 and moderate 80% mid circ by cath 2003. b. nonischemic nuc in 2013.   Cancer ([place])    basal cell removed fr. L arm    Carotid arterial disease ([place])    a. 03/2017 showed 1-39% bilateral internal carotid stenosis (high end range on left),  >50% LECA stenosis, with recommendation for f/u duplex 03/2018.   CHF (congestive heart failure) (HCC)    Clotting disorder (HCC)    Complication of anesthesia    cardiac arrest- in OR, at age 72 (40)y.o. during Scalenotomy in her early 20's   Diabetes [facility])    Drug allergy    heparin/lovenox   DVT (deep venous thrombosis) (HCC)    Erosive gastritis 1994   Family history of adverse reaction to anesthesia    some family members have had trouble waking up   GERD (gastroesophageal reflux disease)    H/O hiatal hernia    Heart murmur    atrial fib, controlled   History of colon polyps    HLD (hyperlipidemia)    HTN (hypertension)    Pt states her high blood pressure readings are due tot he method of measurementsMcAlhaney at [employer], manages pt.  LOV 03/2014   Hypothyroidism    Liver spot    cyst- - no biopsy, but told that its benign    Moderate mitral regurgitation    PAF (paroxysmal atrial fibrillation) ([place])    Pulmonary embolism ([place])    related to back surgery with prior coumadin use, now off   PVC's (premature ventricular contractions)    a. noted in 09/2017.   Septic shock (HCC)    Spondylosis, lumbosacral    ARTHRITIS- OA    Urinary incontinence     Past Surgical History:  Procedure Laterality Date   ABDOMINAL HYSTERECTOMY  partial abdominal -1998   BACK SURGERY     x13 cervical and lumbar thoracic spine surgery   BIOPSY  01/26/2021   Procedure: BIOPSY;  Surgeon: Lavena Bullion, DO;  Location: [facility];  Service: Gastroenterology;;   BREAST ENHANCEMENT SURGERY     BREAST SURGERY     all benign cysts x3    CARDIAC CATHETERIZATION     2005   CHOLECYSTECTOMY N/A 10/04/2016   Procedure: LAPAROSCOPIC CHOLECYSTECTOMY;  Surgeon: Erroll Luna, MD;  Location: [place];  Service: General;  Laterality: N/A;   cleft lip and palate repair     75 yo    COLONOSCOPY  2012   COLONOSCOPY WITH PROPOFOL N/A 01/26/2021   Procedure: COLONOSCOPY WITH PROPOFOL;  Surgeon:  Lavena Bullion, DO;  Location: [facility];  Service: Gastroenterology;  Laterality: N/A;   ESOPHAGOGASTRODUODENOSCOPY (EGD) WITH PROPOFOL N/A 01/26/2021   Procedure: ESOPHAGOGASTRODUODENOSCOPY (EGD) WITH PROPOFOL;  Surgeon: Lavena Bullion, DO;  Location: [facility];  Service: Gastroenterology;  Laterality: N/A;   hysterectomy     IR GENERIC HISTORICAL  06/04/2016   IR PERC CHOLECYSTOSTOMY 06/04/2016 Greggory Keen, MD [facility]   IR GENERIC HISTORICAL  08/06/2016   IR CHOLANGIOGRAM EXISTING TUBE 08/06/2016 Aletta Edouard, MD [facility]   IR GENERIC HISTORICAL  07/17/2016   IR RADIOLOGIST EVAL & MGMT 07/17/2016 [facility]   JOINT REPLACEMENT     MOHS SURGERY  05/22/2020   right side forehead   POLYPECTOMY  01/26/2021   Procedure: POLYPECTOMY;  Surgeon: Lavena Bullion, DO;  Location: [facility];  Service: Gastroenterology;;   POSTERIOR CERVICAL FUSION/FORAMINOTOMY Right 08/31/2014   Procedure: Right cervical six-seven foraminotomy, Cervical five-Thoracic one fusion, Cervical six-seven lateral mass screws;  Surgeon: Erline Levine, MD;  Location: [place] [facility];  Service: Neurosurgery;  Laterality: Right;   spina bifida repair     75 yo    TONSILLECTOMY     TOTAL KNEE ARTHROPLASTY     left    Current Outpatient Medications  Medication Sig Dispense Refill   albuterol (PROVENTIL HFA;VENTOLIN HFA) 108 (90 Base) MCG/ACT inhaler Inhale 2 puffs into the lungs every 6 (six) hours as needed for wheezing or shortness of breath. 18 g 5   azelastine (ASTELIN) 0.1 % nasal spray USE 1 TO 2 SPRAYS IN EACH NOSTRIL TWICE A DAY. 30 mL 2   Calcium Carb-Cholecalciferol 500-400 MG-UNIT TABS Take 1 tablet by mouth 2 (two) times daily.     CLARITIN-D 12 HOUR 5-120 MG tablet TAKE 1 TABLET EVERY TWELVE HOURS AS NEEDED. 180 tablet 3   CLENPIQ 10-3.5-12 MG-GM -GM/160ML SOLN TAKE AS DIRECTED FOR COLON PREP.     cyclobenzaprine (FLEXERIL) 5 MG tablet Take 5 mg by mouth 3 (three) times daily as  needed.     docusate sodium (COLACE) 100 MG capsule Take 200 mg by mouth 2 (two) times daily.     esomeprazole (NEXIUM) 40 MG capsule Take 1 capsule (40 mg total) by mouth 2 (two) times daily before a meal. 180 capsule 3   fluticasone (FLOVENT HFA) 110 MCG/ACT inhaler Inhale 1-2 puffs into the lungs 2 (two) times daily. 12 g 12   furosemide (LASIX) 40 MG tablet TAKE 1 TABLET ONCE DAILY. 90 tablet 2   gabapentin (NEURONTIN) 600 MG tablet Take 600-750 mg by mouth at bedtime.      Glucosamine-Chondroitin 750-600 MG TABS Take 2 tablets by mouth 2 (two) times daily.      HYDROmorphone (DILAUDID) 4 MG tablet Take  4 mg by mouth daily.      levothyroxine (SYNTHROID) 100 MCG tablet TAKE ONE TABLET ONCE DAILY ONE hour BEFORE BREAKFAST. 30 tablet 3   Melatonin 5 MG TABS Take 2.5-5 mg by mouth at bedtime as needed.     metFORMIN (GLUCOPHAGE XR) 500 MG 24 hr tablet Take 1 tablet (500 mg total) by mouth in the morning and at bedtime. 180 tablet 3   metoprolol tartrate (LOPRESSOR) 100 MG tablet TAKE 1 TABLET BY MOUTH TWICE DAILY. 180 tablet 3   montelukast (SINGULAIR) 10 MG tablet Take 1 tablet (10 mg total) by mouth at bedtime. 90 tablet 3   Multiple Vitamin (MULTIVITAMIN WITH MINERALS) TABS Take 1 tablet by mouth daily.     nitroGLYCERIN (NITROSTAT) 0.4 MG SL tablet DISSOLVE 1 TABLET UNDER TONGUE AS NEEDED FOR CHEST PAIN,MAY REPEAT IN 5 MINUTES FOR 2 DOSES. 25 tablet 5   nystatin (MYCOSTATIN/NYSTOP) powder APPLY TOPICALLY 3 TIMES DAILY. USE AS NEEDED FOR YEAST. 30 g 2   potassium chloride (KLOR-CON) 10 MEQ tablet TAKE 4 TABLETS TWICE DAILY. 240 tablet 6   Respiratory Therapy Supplies (FLUTTER) DEVI Use as directed 1 each 0   rosuvastatin (CRESTOR) 20 MG tablet TAKE 1 TABLET ONCE DAILY. 90 tablet 3   triamcinolone (NASACORT) 55 MCG/ACT nasal inhaler Place 1-2 sprays into the nose at bedtime.      triamcinolone lotion (KENALOG) 0.1 %      XARELTO 20 MG TABS tablet TAKE 1 TABLET ONCE A DAY WITH SUPPER. (Patient  taking differently: 20 mg daily with breakfast.) 30 tablet 11   cyclobenzaprine (FLEXERIL) 10 MG tablet Take 10 mg by mouth 3 (three) times daily as needed for muscle spasms. (Patient not taking: Reported on 10/03/2021)     No current facility-administered medications for this visit.    Allergies  Allergen Reactions   Chlorhexidine Anaphylaxis and Itching    Sensitive to dye in Betadine & Chlorohexadine    Enoxaparin Sodium Anaphylaxis   Heparin Anaphylaxis   Hornet Venom Anaphylaxis    European Hornets   Pork-Derived Products Anaphylaxis   Povidone-Iodine Itching and Rash    NOTE: Patient has had anaphylactic reaction to Betadine due to dyes in product. Sensitivity-" but if its wiped off she is able to tolerate betadine"    Indomethacin Rash   Lactose Intolerance (Gi) Diarrhea and Nausea And Vomiting    Can tolerate most [place] cheeses (Takes Lactaid)   Phenylpropanolamine Hypertension    Social History   Socioeconomic History   Marital status: Married    Spouse name: Not on file   Number of children: Not on file   Years of education: Not on file   Highest education level: Not on file  Occupational History   Not on file  Tobacco Use   Smoking status: Never   Smokeless tobacco: Never   Tobacco comments:    no smoking   Vaping Use   Vaping Use: Never used  Substance and Sexual Activity   Alcohol use: No   Drug use: No   Sexual activity: Not Currently  Other Topics Concern   Not on file  Social History Narrative   Lives in [place] with husband; has had miscarriages but no children.    Disabled but exercises nearly every day (can only exericse in water - aerobics)   Takes opioids for pain relief; takes no herbal medications; has a heart healthy diet.    Social Determinants of Health   Financial Resource Strain: Low Risk  Difficulty of Paying Living Expenses: Not hard at all  Food Insecurity: No Food Insecurity   Worried About [place] in the Last Year:  Never true   Ran Out of Food in the Last Year: Never true  Transportation Needs: No Transportation Needs   Lack of Transportation (Medical): No   Lack of Transportation (Non-Medical): No  Physical Activity: Inactive   Days of Exercise per Week: 0 days   Minutes of Exercise per Session: 0 min  Stress: No Stress Concern Present   Feeling of Stress : Not at all  Social Connections: Not on file  Intimate Partner Violence: Not At Risk   Fear of Current or Ex-Partner: No   Emotionally Abused: No   Physically Abused: No   Sexually Abused: No    Family History  Problem Relation Age of Onset   Heart disease Father        MI    Heart disease Sister    Colon cancer Sister    Colon cancer Cousin        a guy-on father side    Colon cancer Paternal Aunt        great aunt    Colon polyps Neg Hx    Esophageal cancer Neg Hx    Rectal cancer Neg Hx    Stomach cancer Neg Hx     Review of Systems:  As stated in the HPI and otherwise negative.   BP 130/84   Pulse 100   Ht 5\' 2"  (1.575 m)   Wt 198 lb (89.8 kg)   SpO2 95%   BMI 36.21 kg/m   Physical Examination:  General: Well developed, well nourished, NAD  HEENT: OP clear, mucus membranes moist  SKIN: warm, dry. No rashes. Neuro: No focal deficits  Musculoskeletal: Muscle strength 5/5 all ext  Psychiatric: Mood and affect normal  Neck: No JVD, no carotid bruits, no thyromegaly, no lymphadenopathy.  Lungs:Clear bilaterally, no wheezes, rhonci, crackles Cardiovascular: Regular rate and rhythm. No murmurs, gallops or rubs. Abdomen:Soft. Bowel sounds present. Non-tender.  Extremities: No lower extremity edema. Pulses are 2 + in the bilateral DP/PT.  Echo December 2018:  - Left ventricle: The cavity size was normal. Wall thickness was   normal. Systolic function was normal. The estimated ejection   fraction was in the range of 50% to 55%. Wall motion was normal;   there were no regional wall motion abnormalities. Features are    consistent with a pseudonormal left ventricular filling pattern,   with concomitant abnormal relaxation and increased filling   pressure (grade 2 diastolic dysfunction).  Cardiac cath 09/22/02: 1. Ventriculography was performed in the RAO projection. Overall ejection  fraction was 70%. No segmental wall motion abnormalities were  identified.  2. The left main coronary artery has, what appears to be, about 30%  narrowing at the ostium. In most views there does appear to be a patent  ostium; however, some tapered narrowing cannot be excluded.  3. The LAD proper has about 40-50% narrowing, at most, in the mid portion  beyond the diagonal takeoff. The vessel is calcified. The diagonal  takeoff itself has a long 80% stenosis at the ostium extending out into  the mid portion of the diagonal. The distal diagonal does appear to be  suitable for grafting.  4. The large circumflex has about 70-80% focal narrowing prior to the  bifurcation of the marginal.  5. The right coronary artery has some mild luminal irregularities but no  high-grade stenoses.  IMPRESSION:  1. Coronary artery disease with significant involvement and circumflex  involvement.  2. Multiple medical issues, as described above.  EKG:  EKG is  ordered today. The ekg ordered today demonstrates Sinus  Recent Labs: 10/05/2020: ALT 19; TSH 6.14 04/06/2021: BUN 29; Creatinine, Ser 0.85; Hemoglobin 14.6; Platelets 288.0; Potassium 4.9; Sodium 137   Lipid Panel Lipid Panel     Component Value Date/Time   CHOL 161 10/05/2020 1502   TRIG 330.0 (H) 10/05/2020 1502   HDL 51.00 10/05/2020 1502   CHOLHDL 3 10/05/2020 1502   VLDL 66.0 (H) 10/05/2020 1502   LDLDIRECT 76.0 10/05/2020 1502     Wt Readings from Last 3 Encounters:  10/03/21 198 lb (89.8 kg)  08/28/21 198 lb (89.8 kg)  04/06/21 203 lb (92.1 kg)     Other studies Reviewed: Additional studies/ records that were reviewed today include: . Review of the above records  demonstrates:    Assessment and Plan:   1. CAD without angina: She is known to have moderate CAD by cath in 2003. LV function normal by echo in December 2018. She has no chest pain. Will continue statin and beta blocker.  No ASA due to easy bruising while on Xarelto as well.    2. Atrial fib, paroxysmal: She is in sinus today. Continue beta blocker and Xarelto.        3. HYPERTENSION: BP controlled. Continue current therapy  4. CAROTID ARTERY DISEASE: Mild bilateral carotid disease by dopplers in June 2021. Repeat dopplers now.   5. Hyperlipidemia: Lipids followed in primary care per pt. She was changed to Crestor then.  Continue statin.    6. Chronic diastolic CHF: No volume overload on exam. Will continue Lasix  7. Bilateral leg/foot pain: She was told she may have PAD. She has some pain in her legs and feet but no claudication type symptoms. Skin changes appear c/w venous insufficiency,. Will arrange LE arterial dopplers to exclude PAD  Current medicines are reviewed at length with the patient today.  The patient does not have concerns regarding medicines.  The following changes have been made:  no change  Labs/ tests ordered today include:   Orders Placed This Encounter  Procedures   EKG 12-Lead   VAS US CAROTID   VAS [place] LOWER EXTREMITY ARTERIAL DUPLEX   VAS [place] ABI WITH/WO TBI    Disposition:   F/U with me in 12  months  Signed, Lauree Chandler, MD 10/03/2021 4:15 PM    [facility] [place], [place], [place]  [address] Phone: [phone]; Fax: [phone]

## 2021-10-04 DIAGNOSIS — M25572 Pain in left ankle and joints of left foot: Secondary | ICD-10-CM | POA: Diagnosis not present

## 2021-10-10 NOTE — Patient Instructions (Addendum)
It was very nice to see you again today, I will be in touch with your labs as soon as possible.  Assuming all is well please see me in about 6 months  Pneumonia booster given today Please get the Shingrix series at your convenience

## 2021-10-10 NOTE — Progress Notes (Addendum)
[facility] [address], [place], [place] [address] [phone]  Date:  10/11/2021   Name:  Cambryn Charters [facility]   DOB:  December 14, 1945   MRN:  [id number]  PCP:  Darreld Mclean, MD    Chief Complaint: 6 month follow up (Concerns/ questions: none/Flu shot: 07/2021/)   History of Present Illness:  Latoya BACIGALUPO is a 75 y.o. very pleasant female patient who presents with the following:  Patient seen today for 4-month follow-up  Most recent visit with myself was in June History of diabetes, PAF, HTN, hyperlipidemia, anti-phospholipid antibody syndrome, GERD, asthma, CAD Married to [place], never smoker Anticoagulated with Xarelto Her cardiologist is Dr. Angelena Form Her pain management provider is Janina Mayo is treated with hydromorphone 4 mg, number 188/month although she fills it about every 6 weeks typically  At her last visit her A1c was elevated at 8.1%; we will follow-up on this today Patient notes she is compliant with her metformin and has not noted any concerns about high or low blood sugars  She was seen by cardiology, Dr. Angelena Form earlier this month Most recent cardiac cath 2003 1. CAD without angina: She is known to have moderate CAD by cath in 2003. LV function normal by echo in December 2018. She has no chest pain. Will continue statin and beta blocker.  No ASA due to easy bruising while on Xarelto as well.   2. Atrial fib, paroxysmal: She is in sinus today. Continue beta blocker and Xarelto.       3. HYPERTENSION: BP controlled. Continue current therapy 4. CAROTID ARTERY DISEASE: Mild bilateral carotid disease by dopplers in June 2021. Repeat dopplers now.  5. Hyperlipidemia: Lipids followed in primary care per pt. She was changed to Crestor then.  Continue statin.   6. Chronic diastolic CHF: No volume overload on exam. Will continue Lasix 7. Bilateral leg/foot pain: She was told she may have PAD. She has some  pain in her legs and feet but no claudication type symptoms. Skin changes appear c/w venous insufficiency. Will arrange LE arterial dopplers to exclude PAD  She also follows with orthopedics for knee and ankle issues  She is doing PT right now for an achilles issue - at [facility]- recommend to have done at her pharmacy  Can offer dose of Pneumovax- she would like to today  COVID booster- done a month ago Flu vaccine is done Due for labs, urine microalbumin  Lasix 40 mg daily Gabapentin Dilaudid Levothyroxine 100 mcg Metformin XR 500 twice daily Lopressor 100 twice daily Singulair Potassium 40 mEq twice daily Crestor 20 Xarelto 20 daily  Pt notes her home BP may run 100/60-she checks it frequently Apparently, she has chronic lower extremity edema bilaterally.  She has not noted any pain but her feet are often cold.  Dr. Nestor Lewandowsky has ordered vascular studies for her  Patient Active Problem List   Diagnosis Date Noted   Adenomatous polyp of sigmoid colon    Adenomatous polyp of cecum    Rectal polyp    Diverticulosis of colon without hemorrhage    AVM (arteriovenous malformation) of colon without hemorrhage    Family history of colon cancer    Spondylosis, lumbosacral    Pulmonary embolism (HCC)    PAF (paroxysmal atrial fibrillation) (HCC)    Moderate mitral regurgitation    H/O hiatal hernia    DVT (deep venous thrombosis) ([place])  Complication of anesthesia    Carotid arterial disease (HCC)    Cancer ([place])    Antiphospholipid syndrome (HCC)    Anemia, iron deficiency    Diabetes mellitus without complication ([place]) 94/85/4627   Leukocytosis 08/04/2016   Hypothyroidism 08/04/2016   Anemia of chronic disease 08/04/2016   Gastroparesis    Enterococcal bacteremia 06/06/2016   Bacteremia due to Klebsiella pneumoniae 06/06/2016   Bacteremia due to Escherichia coli 06/06/2016   Ascending cholangitis 06/04/2016   Hemangioma of liver 06/04/2016   Cervical  pseudoarthrosis ([place]) 08/31/2014   Chest pain, atypical 04/11/2013   PAD (peripheral artery disease) ([place]) 06/20/2011   BACK PAIN, CHRONIC 06/01/2010   CAD in native artery 11/15/2009   MURMUR 11/15/2009   CAROTID BRUIT, LEFT 11/15/2009   Essential hypertension 03/18/2009   Asthma 03/18/2009   GERD 03/18/2009   Hyperlipidemia 11/29/2008   Gastritis and gastroduodenitis 10/22/1992    Past Medical History:  Diagnosis Date   Allergy    seasonal   Anemia, iron deficiency    Antiphospholipid syndrome ([place])    with hypercoagulable state   Asthma    extrinsic; moderate, persistant, nml spirometry 2010, nml CXR 1/08   Atrial fibrillation (HCC)    Bladder troubles    REPORTS INFECTIONS ON OCCASION DUE TO URETHRA MEATUS STRICTURE AT BIRTH    CAD (coronary artery disease)    a. 50% mid LAD, 80% ostial D1 and moderate 80% mid circ by cath 2003. b. nonischemic nuc in 2013.   Cancer ([place])    basal cell removed fr. L arm    Carotid arterial disease ([place])    a. 03/2017 showed 1-39% bilateral internal carotid stenosis (high end range on left), >50% LECA stenosis, with recommendation for f/u duplex 03/2018.   CHF (congestive heart failure) (HCC)    Clotting disorder (HCC)    Complication of anesthesia    cardiac arrest- in OR, at age 75 (85)y.o. during Scalenotomy in her early 20's   Diabetes [facility])    Drug allergy    heparin/lovenox   DVT (deep venous thrombosis) (HCC)    Erosive gastritis 1994   Family history of adverse reaction to anesthesia    some family members have had trouble waking up   GERD (gastroesophageal reflux disease)    H/O hiatal hernia    Heart murmur    atrial fib, controlled   History of colon polyps    HLD (hyperlipidemia)    HTN (hypertension)    Pt states her high blood pressure readings are due tot he method of measurements[facility] at [employer], manages pt.  LOV 03/2014   Hypothyroidism    Liver spot    cyst- - no biopsy, but told that its benign    Moderate  mitral regurgitation    PAF (paroxysmal atrial fibrillation) ([place])    Pulmonary embolism ([place])    related to back surgery with prior coumadin use, now off   PVC's (premature ventricular contractions)    a. noted in 09/2017.   Septic shock (HCC)    Spondylosis, lumbosacral    ARTHRITIS- OA    Urinary incontinence     Past Surgical History:  Procedure Laterality Date   ABDOMINAL HYSTERECTOMY     partial abdominal -1998   BACK SURGERY     x13 cervical and lumbar thoracic spine surgery   BIOPSY  01/26/2021   Procedure: BIOPSY;  Surgeon: Lavena Bullion, DO;  Location: [facility];  Service: Gastroenterology;;   BREAST ENHANCEMENT SURGERY  BREAST SURGERY     all benign cysts x3    CARDIAC CATHETERIZATION     2005   CHOLECYSTECTOMY N/A 10/04/2016   Procedure: LAPAROSCOPIC CHOLECYSTECTOMY;  Surgeon: Erroll Luna, MD;  Location: [facility];  Service: General;  Laterality: N/A;   cleft lip and palate repair     75 yo    COLONOSCOPY  2012   COLONOSCOPY WITH PROPOFOL N/A 01/26/2021   Procedure: COLONOSCOPY WITH PROPOFOL;  Surgeon: Lavena Bullion, DO;  Location: [facility];  Service: Gastroenterology;  Laterality: N/A;   ESOPHAGOGASTRODUODENOSCOPY (EGD) WITH PROPOFOL N/A 01/26/2021   Procedure: ESOPHAGOGASTRODUODENOSCOPY (EGD) WITH PROPOFOL;  Surgeon: Lavena Bullion, DO;  Location: [facility];  Service: Gastroenterology;  Laterality: N/A;   hysterectomy     IR GENERIC HISTORICAL  06/04/2016   IR PERC CHOLECYSTOSTOMY 06/04/2016 Greggory Keen, MD [facility]   IR GENERIC HISTORICAL  08/06/2016   IR CHOLANGIOGRAM EXISTING TUBE 08/06/2016 Aletta Edouard, MD [facility]   IR GENERIC HISTORICAL  07/17/2016   IR RADIOLOGIST EVAL & MGMT 07/17/2016 [facility]   JOINT REPLACEMENT     MOHS SURGERY  05/22/2020   right side forehead   POLYPECTOMY  01/26/2021   Procedure: POLYPECTOMY;  Surgeon: Lavena Bullion, DO;  Location: [facility];  Service: Gastroenterology;;   POSTERIOR  CERVICAL FUSION/FORAMINOTOMY Right 08/31/2014   Procedure: Right cervical six-seven foraminotomy, Cervical five-Thoracic one fusion, Cervical six-seven lateral mass screws;  Surgeon: Erline Levine, MD;  Location: [facility];  Service: Neurosurgery;  Laterality: Right;   spina bifida repair     75 yo    TONSILLECTOMY     TOTAL KNEE ARTHROPLASTY     left    Social History   Tobacco Use   Smoking status: Never   Smokeless tobacco: Never   Tobacco comments:    no smoking   Vaping Use   Vaping Use: Never used  Substance Use Topics   Alcohol use: No   Drug use: No    Family History  Problem Relation Age of Onset   Heart disease Father        MI    Heart disease Sister    Colon cancer Sister    Colon cancer Cousin        a guy-on father side    Colon cancer Paternal Aunt        great aunt    Colon polyps Neg Hx    Esophageal cancer Neg Hx    Rectal cancer Neg Hx    Stomach cancer Neg Hx     Allergies  Allergen Reactions   Chlorhexidine Anaphylaxis and Itching    Sensitive to dye in Betadine & Chlorohexadine    Enoxaparin Sodium Anaphylaxis   Heparin Anaphylaxis   Hornet Venom Anaphylaxis    European Hornets   Pork-Derived Products Anaphylaxis   Povidone-Iodine Itching and Rash    NOTE: Patient has had anaphylactic reaction to Betadine due to dyes in product. Sensitivity-" but if its wiped off she is able to tolerate betadine"    Indomethacin Rash   Lactose Intolerance (Gi) Diarrhea and Nausea And Vomiting    Can tolerate most [place] cheeses (Takes Lactaid)   Phenylpropanolamine Hypertension    Medication list has been reviewed and updated.  Current Outpatient Medications on File Prior to Visit  Medication Sig Dispense Refill   albuterol (PROVENTIL HFA;VENTOLIN HFA) 108 (90 Base) MCG/ACT inhaler Inhale 2 puffs into the lungs every 6 (six) hours as needed for wheezing  or shortness of breath. 18 g 5   azelastine (ASTELIN) 0.1 % nasal spray USE 1 TO 2 SPRAYS IN  EACH NOSTRIL TWICE A DAY. 30 mL 2   Calcium Carb-Cholecalciferol 500-400 MG-UNIT TABS Take 1 tablet by mouth 2 (two) times daily.     CLARITIN-D 12 HOUR 5-120 MG tablet TAKE 1 TABLET EVERY TWELVE HOURS AS NEEDED. 180 tablet 3   cyclobenzaprine (FLEXERIL) 5 MG tablet Take 5 mg by mouth 3 (three) times daily as needed.     docusate sodium (COLACE) 100 MG capsule Take 200 mg by mouth 2 (two) times daily.     esomeprazole (NEXIUM) 40 MG capsule Take 1 capsule (40 mg total) by mouth 2 (two) times daily before a meal. 180 capsule 3   fluticasone (FLOVENT HFA) 110 MCG/ACT inhaler Inhale 1-2 puffs into the lungs 2 (two) times daily. 12 g 12   furosemide (LASIX) 40 MG tablet TAKE 1 TABLET ONCE DAILY. 90 tablet 2   gabapentin (NEURONTIN) 600 MG tablet Take 600-750 mg by mouth at bedtime.      Glucosamine-Chondroitin 750-600 MG TABS Take 2 tablets by mouth 2 (two) times daily.      HYDROmorphone (DILAUDID) 4 MG tablet Take 4 mg by mouth daily.      levothyroxine (SYNTHROID) 100 MCG tablet TAKE ONE TABLET ONCE DAILY ONE hour BEFORE BREAKFAST. 30 tablet 3   Melatonin 5 MG TABS Take 2.5-5 mg by mouth at bedtime as needed.     metFORMIN (GLUCOPHAGE XR) 500 MG 24 hr tablet Take 1 tablet (500 mg total) by mouth in the morning and at bedtime. 180 tablet 3   metoprolol tartrate (LOPRESSOR) 100 MG tablet TAKE 1 TABLET BY MOUTH TWICE DAILY. 180 tablet 3   montelukast (SINGULAIR) 10 MG tablet Take 1 tablet (10 mg total) by mouth at bedtime. 90 tablet 3   Multiple Vitamin (MULTIVITAMIN WITH MINERALS) TABS Take 1 tablet by mouth daily.     nitroGLYCERIN (NITROSTAT) 0.4 MG SL tablet DISSOLVE 1 TABLET UNDER TONGUE AS NEEDED FOR CHEST PAIN,MAY REPEAT IN 5 MINUTES FOR 2 DOSES. 25 tablet 5   nystatin (MYCOSTATIN/NYSTOP) powder APPLY TOPICALLY 3 TIMES DAILY. USE AS NEEDED FOR YEAST. 30 g 2   potassium chloride (KLOR-CON) 10 MEQ tablet TAKE 4 TABLETS TWICE DAILY. 240 tablet 6   Respiratory Therapy Supplies (FLUTTER) DEVI Use  as directed 1 each 0   rosuvastatin (CRESTOR) 20 MG tablet TAKE 1 TABLET ONCE DAILY. 90 tablet 3   triamcinolone (NASACORT) 55 MCG/ACT nasal inhaler Place 1-2 sprays into the nose at bedtime.      triamcinolone lotion (KENALOG) 0.1 %      XARELTO 20 MG TABS tablet TAKE 1 TABLET ONCE A DAY WITH SUPPER. (Patient taking differently: 20 mg daily with breakfast.) 30 tablet 11   No current facility-administered medications on file prior to visit.    Review of Systems:  As per HPI- otherwise negative.  Physical Examination: Vitals:   10/11/21 1347 10/11/21 1405  BP: (!) 162/98 (!) 150/80  Pulse: 92   Resp: 18   SpO2: 98%    Vitals:   10/11/21 1347  Weight: 196 lb 9.6 oz (89.2 kg)  Height: 5\' 2"  (1.575 m)   Body mass index is 35.96 kg/m. Ideal Body Weight: Weight in (lb) to have BMI = 25: 136.4  GEN: no acute distress.  Obese, looks well  HEENT: Atraumatic, Normocephalic.  Ears and Nose: No external deformity. CV: RRR, No M/G/R. No JVD. No  thrill. No extra heart sounds. PULM: CTA B, no wheezes, crackles, rhonchi. No retractions. No resp. distress. No accessory muscle use. ABD: S, NT, ND, +BS. No rebound. No HSM. PSYCH: Normally interactive. Conversant.  Pt has chronic venous stasis edema and dermatitis in both lower extremities Toes are cool and I have difficulty palpating a pulse She has a LE arterial study ordered per cardiology already   Assessment and Plan: Essential hypertension - Plan: CBC, Comprehensive metabolic panel  Diabetes mellitus without complication ([place]) - Plan: Comprehensive metabolic panel, Hemoglobin A1c, Microalbumin / creatinine urine ratio  Hypothyroidism, unspecified type - Plan: TSH  Hyperlipidemia, unspecified hyperlipidemia type - Plan: Lipid panel  CAD in native artery  PAD (peripheral artery disease) (HCC)  Fatigue, unspecified type - Plan: TSH  Immunization due - Plan: Pneumococcal polysaccharide vaccine 23-valent greater than or equal to  2yo subcutaneous/IM  Lower extremity edema  Blood pressure elevated today but patient notes it is typically normal at home.  Admits she tends to get nervous at the doctor's office Following up on diabetes today Follow-up on thyroid and hyperlipidemia Updated pneumonia vaccine, discussed other indicated immunizations Lower extremity vascular studies are scheduled Will plan further follow- up pending labs.  Plan for 1-month visit if all is well   Signed Lamar Blinks, MD  Received labs 12/26- message to pt  Results for orders placed or performed in visit on 10/11/21  CBC  Result Value Ref Range   WBC 8.5 4.0 - 10.5 K/uL   RBC 4.79 3.87 - 5.11 Mil/uL   Platelets 244.0 150.0 - 400.0 K/uL   Hemoglobin 13.9 12.0 - 15.0 g/dL   HCT 41.2 36.0 - 46.0 %   MCV 86.1 78.0 - 100.0 fl   MCHC 33.7 30.0 - 36.0 g/dL   RDW 13.8 11.5 - 15.5 %  Comprehensive metabolic panel  Result Value Ref Range   Sodium 142 135 - 145 mEq/L   Potassium 4.4 3.5 - 5.1 mEq/L   Chloride 102 96 - 112 mEq/L   CO2 29 19 - 32 mEq/L   Glucose, Bld 116 (H) 70 - 99 mg/dL   BUN 23 6 - 23 mg/dL   Creatinine, Ser 0.75 0.40 - 1.20 mg/dL   Total Bilirubin 0.4 0.2 - 1.2 mg/dL   Alkaline Phosphatase 69 39 - 117 U/L   AST 20 0 - 37 U/L   ALT 23 0 - 35 U/L   Total Protein 7.0 6.0 - 8.3 g/dL   Albumin 4.5 3.5 - 5.2 g/dL   GFR 78.30 >60.00 mL/min   Calcium 10.3 8.4 - 10.5 mg/dL  Hemoglobin A1c  Result Value Ref Range   Hgb A1c MFr Bld 7.2 (H) 4.6 - 6.5 %  Lipid panel  Result Value Ref Range   Cholesterol 142 0 - 200 mg/dL   Triglycerides 148.0 0.0 - 149.0 mg/dL   HDL 57.70 >39.00 mg/dL   VLDL 29.6 0.0 - 40.0 mg/dL   LDL Cholesterol 54 0 - 99 mg/dL   Total CHOL/HDL Ratio 2    NonHDL 83.91   Microalbumin / creatinine urine ratio  Result Value Ref Range   Microalb, Ur 32.2 (H) 0.0 - 1.9 mg/dL   Creatinine,U 90.5 mg/dL   Microalb Creat Ratio 35.5 (H) 0.0 - 30.0 mg/g  TSH  Result Value Ref Range   TSH 0.38 0.35 -  5.50 uIU/mL

## 2021-10-11 ENCOUNTER — Ambulatory Visit (INDEPENDENT_AMBULATORY_CARE_PROVIDER_SITE_OTHER): Payer: Medicare Other | Admitting: Family Medicine

## 2021-10-11 VITALS — BP 150/80 | HR 92 | Resp 18 | Ht 62.0 in | Wt 196.6 lb

## 2021-10-11 DIAGNOSIS — I739 Peripheral vascular disease, unspecified: Secondary | ICD-10-CM

## 2021-10-11 DIAGNOSIS — E039 Hypothyroidism, unspecified: Secondary | ICD-10-CM

## 2021-10-11 DIAGNOSIS — E119 Type 2 diabetes mellitus without complications: Secondary | ICD-10-CM

## 2021-10-11 DIAGNOSIS — I6523 Occlusion and stenosis of bilateral carotid arteries: Secondary | ICD-10-CM

## 2021-10-11 DIAGNOSIS — I2510 Atherosclerotic heart disease of native coronary artery without angina pectoris: Secondary | ICD-10-CM

## 2021-10-11 DIAGNOSIS — I10 Essential (primary) hypertension: Secondary | ICD-10-CM | POA: Diagnosis not present

## 2021-10-11 DIAGNOSIS — E785 Hyperlipidemia, unspecified: Secondary | ICD-10-CM | POA: Diagnosis not present

## 2021-10-11 DIAGNOSIS — Z23 Encounter for immunization: Secondary | ICD-10-CM

## 2021-10-11 DIAGNOSIS — R5383 Other fatigue: Secondary | ICD-10-CM | POA: Diagnosis not present

## 2021-10-11 DIAGNOSIS — R600 Localized edema: Secondary | ICD-10-CM | POA: Diagnosis not present

## 2021-10-11 DIAGNOSIS — M25572 Pain in left ankle and joints of left foot: Secondary | ICD-10-CM | POA: Diagnosis not present

## 2021-10-12 LAB — COMPREHENSIVE METABOLIC PANEL
ALT: 23 U/L (ref 0–35)
AST: 20 U/L (ref 0–37)
Albumin: 4.5 g/dL (ref 3.5–5.2)
Alkaline Phosphatase: 69 U/L (ref 39–117)
BUN: 23 mg/dL (ref 6–23)
CO2: 29 mEq/L (ref 19–32)
Calcium: 10.3 mg/dL (ref 8.4–10.5)
Chloride: 102 mEq/L (ref 96–112)
Creatinine, Ser: 0.75 mg/dL (ref 0.40–1.20)
GFR: 78.3 mL/min (ref >60.00)
Glucose, Bld: 116 mg/dL — ABNORMAL HIGH (ref 70–99)
Potassium: 4.4 mEq/L (ref 3.5–5.1)
Sodium: 142 mEq/L (ref 135–145)
Total Bilirubin: 0.4 mg/dL (ref 0.2–1.2)
Total Protein: 7 g/dL (ref 6.0–8.3)

## 2021-10-12 LAB — MICROALBUMIN / CREATININE URINE RATIO
Creatinine,U: 90.5 mg/dL
Microalb Creat Ratio: 35.5 mg/g — ABNORMAL HIGH (ref 0.0–30.0)
Microalb, Ur: 32.2 mg/dL — ABNORMAL HIGH (ref 0.0–1.9)

## 2021-10-12 LAB — LIPID PANEL
Cholesterol: 142 mg/dL (ref 0–200)
HDL: 57.7 mg/dL (ref >39.00)
LDL Cholesterol: 54 mg/dL (ref 0–99)
NonHDL: 83.91
Total CHOL/HDL Ratio: 2
Triglycerides: 148 mg/dL (ref 0.0–149.0)
VLDL: 29.6 mg/dL (ref 0.0–40.0)

## 2021-10-12 LAB — CBC
HCT: 41.2 % (ref 36.0–46.0)
Hemoglobin: 13.9 g/dL (ref 12.0–15.0)
MCHC: 33.7 g/dL (ref 30.0–36.0)
MCV: 86.1 fl (ref 78.0–100.0)
Platelets: 244 10*3/uL (ref 150.0–400.0)
RBC: 4.79 Mil/uL (ref 3.87–5.11)
RDW: 13.8 % (ref 11.5–15.5)
WBC: 8.5 10*3/uL (ref 4.0–10.5)

## 2021-10-12 LAB — HEMOGLOBIN A1C: Hgb A1c MFr Bld: 7.2 % — ABNORMAL HIGH (ref 4.6–6.5)

## 2021-10-12 LAB — TSH: TSH: 0.38 u[IU]/mL (ref 0.35–5.50)

## 2021-10-13 ENCOUNTER — Other Ambulatory Visit: Payer: Self-pay | Admitting: Cardiovascular Disease

## 2021-10-16 ENCOUNTER — Encounter: Payer: Self-pay | Admitting: Family Medicine

## 2021-10-16 DIAGNOSIS — R809 Proteinuria, unspecified: Secondary | ICD-10-CM

## 2021-10-16 DIAGNOSIS — E876 Hypokalemia: Secondary | ICD-10-CM

## 2021-10-17 ENCOUNTER — Encounter: Payer: Self-pay | Admitting: Family Medicine

## 2021-10-17 DIAGNOSIS — Z1231 Encounter for screening mammogram for malignant neoplasm of breast: Secondary | ICD-10-CM | POA: Diagnosis not present

## 2021-10-19 DIAGNOSIS — M25572 Pain in left ankle and joints of left foot: Secondary | ICD-10-CM | POA: Diagnosis not present

## 2021-10-24 DIAGNOSIS — M963 Postlaminectomy kyphosis: Secondary | ICD-10-CM | POA: Diagnosis not present

## 2021-10-24 DIAGNOSIS — M79645 Pain in left finger(s): Secondary | ICD-10-CM | POA: Diagnosis not present

## 2021-10-24 DIAGNOSIS — G894 Chronic pain syndrome: Secondary | ICD-10-CM | POA: Diagnosis not present

## 2021-10-24 DIAGNOSIS — M4722 Other spondylosis with radiculopathy, cervical region: Secondary | ICD-10-CM | POA: Diagnosis not present

## 2021-10-25 MED ORDER — LOSARTAN POTASSIUM 25 MG PO TABS: 12.5000 mg | ORAL_TABLET | Freq: Every day | ORAL | 3 refills | Status: AC

## 2021-10-27 ENCOUNTER — Other Ambulatory Visit: Payer: Self-pay | Admitting: Cardiovascular Disease

## 2021-10-27 ENCOUNTER — Ambulatory Visit (HOSPITAL_COMMUNITY)
Admission: RE | Admit: 2021-10-27 | Discharge: 2021-10-27 | Disposition: A | Discharge status: Home / Self Care | Payer: Medicare Other | Source: Ambulatory Visit | Attending: Cardiology | Admitting: Cardiology

## 2021-10-27 ENCOUNTER — Other Ambulatory Visit: Payer: Self-pay

## 2021-10-27 ENCOUNTER — Ambulatory Visit (HOSPITAL_BASED_OUTPATIENT_CLINIC_OR_DEPARTMENT_OTHER)
Admission: RE | Admit: 2021-10-27 | Discharge: 2021-10-27 | Disposition: A | Discharge status: Home / Self Care | Payer: Medicare Other | Source: Ambulatory Visit | Attending: Cardiovascular Disease | Admitting: Cardiovascular Disease

## 2021-10-27 DIAGNOSIS — I5032 Chronic diastolic (congestive) heart failure: Secondary | ICD-10-CM | POA: Insufficient documentation

## 2021-10-27 DIAGNOSIS — I480 Paroxysmal atrial fibrillation: Secondary | ICD-10-CM

## 2021-10-27 DIAGNOSIS — M79604 Pain in right leg: Secondary | ICD-10-CM

## 2021-10-27 DIAGNOSIS — I2510 Atherosclerotic heart disease of native coronary artery without angina pectoris: Secondary | ICD-10-CM

## 2021-10-27 DIAGNOSIS — I6523 Occlusion and stenosis of bilateral carotid arteries: Secondary | ICD-10-CM | POA: Insufficient documentation

## 2021-10-27 DIAGNOSIS — M79605 Pain in left leg: Secondary | ICD-10-CM

## 2021-10-27 DIAGNOSIS — I10 Essential (primary) hypertension: Secondary | ICD-10-CM

## 2021-10-27 DIAGNOSIS — E785 Hyperlipidemia, unspecified: Secondary | ICD-10-CM | POA: Insufficient documentation

## 2021-10-27 NOTE — Telephone Encounter (Signed)
Prescription refill request for Xarelto received.  Indication: Pe, afib  Last office visit: [place], 10/03/2021 Weight: 89.2 kg  Age: 76 yo  Scr: 0.75, 10/11/2021 CrCl: 93 ml/min   Refill sent.

## 2021-11-03 NOTE — Addendum Note (Signed)
Addended by: Lamar Blinks C on: 11/03/2021 06:30 AM   Modules accepted: Orders

## 2021-11-13 DIAGNOSIS — M79672 Pain in left foot: Secondary | ICD-10-CM | POA: Diagnosis not present

## 2021-11-16 ENCOUNTER — Telehealth: Payer: Self-pay | Admitting: *Deleted

## 2021-11-16 NOTE — Telephone Encounter (Signed)
Pt has lab appointment on 11/21/21.  There is a PTH ordered in June of 2022 but never completed yet. She also has recent order for Bmet.  Do we need to get the PTH as well or cancel at this time?

## 2021-11-17 NOTE — Telephone Encounter (Signed)
PTH cancelled, thank you!

## 2021-11-21 ENCOUNTER — Other Ambulatory Visit: Payer: Medicare Other

## 2021-11-24 ENCOUNTER — Other Ambulatory Visit: Payer: Medicare Other

## 2021-11-27 ENCOUNTER — Other Ambulatory Visit (INDEPENDENT_AMBULATORY_CARE_PROVIDER_SITE_OTHER): Payer: Medicare Other

## 2021-11-27 DIAGNOSIS — E876 Hypokalemia: Secondary | ICD-10-CM | POA: Diagnosis not present

## 2021-11-28 ENCOUNTER — Encounter: Payer: Self-pay | Admitting: Family Medicine

## 2021-11-28 LAB — BASIC METABOLIC PANEL
BUN: 16 mg/dL (ref 6–23)
CO2: 28 mEq/L (ref 19–32)
Calcium: 10.5 mg/dL (ref 8.4–10.5)
Chloride: 102 mEq/L (ref 96–112)
Creatinine, Ser: 0.77 mg/dL (ref 0.40–1.20)
GFR: 75.8 mL/min (ref >60.00)
Glucose, Bld: 196 mg/dL — ABNORMAL HIGH (ref 70–99)
Potassium: 4.5 mEq/L (ref 3.5–5.1)
Sodium: 143 mEq/L (ref 135–145)

## 2021-12-01 ENCOUNTER — Ambulatory Visit: Payer: Medicare Other | Admitting: Cardiovascular Disease

## 2021-12-05 DIAGNOSIS — M4722 Other spondylosis with radiculopathy, cervical region: Secondary | ICD-10-CM | POA: Diagnosis not present

## 2021-12-05 DIAGNOSIS — M79645 Pain in left finger(s): Secondary | ICD-10-CM | POA: Diagnosis not present

## 2021-12-05 DIAGNOSIS — G894 Chronic pain syndrome: Secondary | ICD-10-CM | POA: Diagnosis not present

## 2021-12-05 DIAGNOSIS — M963 Postlaminectomy kyphosis: Secondary | ICD-10-CM | POA: Diagnosis not present

## 2021-12-15 ENCOUNTER — Other Ambulatory Visit: Payer: Self-pay | Admitting: Cardiovascular Disease

## 2021-12-20 DIAGNOSIS — Z20822 Contact with and (suspected) exposure to covid-19: Secondary | ICD-10-CM | POA: Diagnosis not present

## 2021-12-31 ENCOUNTER — Other Ambulatory Visit: Payer: Self-pay | Admitting: Family Medicine

## 2021-12-31 DIAGNOSIS — E039 Hypothyroidism, unspecified: Secondary | ICD-10-CM

## 2022-01-05 ENCOUNTER — Other Ambulatory Visit: Payer: Self-pay | Admitting: Cardiovascular Disease

## 2022-01-05 ENCOUNTER — Other Ambulatory Visit: Payer: Self-pay | Admitting: Family Medicine

## 2022-01-16 DIAGNOSIS — M963 Postlaminectomy kyphosis: Secondary | ICD-10-CM | POA: Diagnosis not present

## 2022-01-16 DIAGNOSIS — M79645 Pain in left finger(s): Secondary | ICD-10-CM | POA: Diagnosis not present

## 2022-01-16 DIAGNOSIS — G894 Chronic pain syndrome: Secondary | ICD-10-CM | POA: Diagnosis not present

## 2022-01-16 DIAGNOSIS — Z79891 Long term (current) use of opiate analgesic: Secondary | ICD-10-CM | POA: Diagnosis not present

## 2022-01-16 DIAGNOSIS — M4722 Other spondylosis with radiculopathy, cervical region: Secondary | ICD-10-CM | POA: Diagnosis not present

## 2022-01-26 ENCOUNTER — Other Ambulatory Visit: Payer: Self-pay | Admitting: Gastroenterology

## 2022-02-06 DIAGNOSIS — Z20822 Contact with and (suspected) exposure to covid-19: Secondary | ICD-10-CM | POA: Diagnosis not present

## 2022-02-23 ENCOUNTER — Other Ambulatory Visit: Payer: Self-pay | Admitting: Cardiovascular Disease

## 2022-02-24 ENCOUNTER — Other Ambulatory Visit: Payer: Self-pay | Admitting: Family Medicine

## 2022-02-26 ENCOUNTER — Encounter: Payer: Self-pay | Admitting: Family Medicine

## 2022-02-27 DIAGNOSIS — G894 Chronic pain syndrome: Secondary | ICD-10-CM | POA: Diagnosis not present

## 2022-02-27 DIAGNOSIS — M4722 Other spondylosis with radiculopathy, cervical region: Secondary | ICD-10-CM | POA: Diagnosis not present

## 2022-02-27 DIAGNOSIS — M963 Postlaminectomy kyphosis: Secondary | ICD-10-CM | POA: Diagnosis not present

## 2022-02-27 DIAGNOSIS — M79645 Pain in left finger(s): Secondary | ICD-10-CM | POA: Diagnosis not present

## 2022-02-27 MED ORDER — EPINEPHRINE 0.3 MG/0.3ML IJ SOAJ: INTRAMUSCULAR | 99 refills | Status: AC

## 2022-02-28 ENCOUNTER — Other Ambulatory Visit: Payer: Self-pay | Admitting: Family Medicine

## 2022-03-12 ENCOUNTER — Encounter: Payer: Self-pay | Admitting: Family Medicine

## 2022-03-12 DIAGNOSIS — B372 Candidiasis of skin and nail: Secondary | ICD-10-CM

## 2022-03-12 MED ORDER — NYSTATIN 100000 UNIT/GM EX POWD: CUTANEOUS | 2 refills | Status: DC

## 2022-03-16 ENCOUNTER — Other Ambulatory Visit: Payer: Self-pay | Admitting: Cardiovascular Disease

## 2022-03-23 ENCOUNTER — Other Ambulatory Visit: Payer: Self-pay | Admitting: Family Medicine

## 2022-03-30 ENCOUNTER — Other Ambulatory Visit: Payer: Self-pay | Admitting: Family Medicine

## 2022-03-30 DIAGNOSIS — J4521 Mild intermittent asthma with (acute) exacerbation: Secondary | ICD-10-CM

## 2022-03-30 DIAGNOSIS — E039 Hypothyroidism, unspecified: Secondary | ICD-10-CM

## 2022-04-03 ENCOUNTER — Telehealth: Payer: Self-pay

## 2022-04-03 NOTE — Telephone Encounter (Signed)
Initiated:  Lawrence Marseilles KeyCarol Ada - PA Case ID: [id number] - Rx #: [id number]

## 2022-04-10 DIAGNOSIS — M4722 Other spondylosis with radiculopathy, cervical region: Secondary | ICD-10-CM | POA: Diagnosis not present

## 2022-04-10 DIAGNOSIS — M79645 Pain in left finger(s): Secondary | ICD-10-CM | POA: Diagnosis not present

## 2022-04-10 DIAGNOSIS — G894 Chronic pain syndrome: Secondary | ICD-10-CM | POA: Diagnosis not present

## 2022-04-10 DIAGNOSIS — M963 Postlaminectomy kyphosis: Secondary | ICD-10-CM | POA: Diagnosis not present

## 2022-04-16 ENCOUNTER — Ambulatory Visit (INDEPENDENT_AMBULATORY_CARE_PROVIDER_SITE_OTHER): Payer: Medicare Other | Admitting: Family Medicine

## 2022-04-16 VITALS — BP 134/80 | HR 83 | Temp 97.7°F | Resp 18 | Ht 62.0 in | Wt 197.8 lb

## 2022-04-16 DIAGNOSIS — I2510 Atherosclerotic heart disease of native coronary artery without angina pectoris: Secondary | ICD-10-CM | POA: Diagnosis not present

## 2022-04-16 DIAGNOSIS — I739 Peripheral vascular disease, unspecified: Secondary | ICD-10-CM | POA: Diagnosis not present

## 2022-04-16 DIAGNOSIS — E039 Hypothyroidism, unspecified: Secondary | ICD-10-CM | POA: Diagnosis not present

## 2022-04-16 DIAGNOSIS — L309 Dermatitis, unspecified: Secondary | ICD-10-CM | POA: Diagnosis not present

## 2022-04-16 DIAGNOSIS — I6523 Occlusion and stenosis of bilateral carotid arteries: Secondary | ICD-10-CM | POA: Diagnosis not present

## 2022-04-16 DIAGNOSIS — E785 Hyperlipidemia, unspecified: Secondary | ICD-10-CM

## 2022-04-16 DIAGNOSIS — E119 Type 2 diabetes mellitus without complications: Secondary | ICD-10-CM | POA: Diagnosis not present

## 2022-04-16 DIAGNOSIS — I10 Essential (primary) hypertension: Secondary | ICD-10-CM | POA: Diagnosis not present

## 2022-04-16 MED ORDER — METRONIDAZOLE 1 % EX GEL: Freq: Every day | CUTANEOUS | 0 refills | Status: AC

## 2022-04-17 ENCOUNTER — Encounter: Payer: Self-pay | Admitting: Family Medicine

## 2022-04-17 DIAGNOSIS — E119 Type 2 diabetes mellitus without complications: Secondary | ICD-10-CM

## 2022-04-17 LAB — TSH: TSH: 2.25 u[IU]/mL (ref 0.35–5.50)

## 2022-04-17 LAB — COMPREHENSIVE METABOLIC PANEL
ALT: 18 U/L (ref 0–35)
AST: 20 U/L (ref 0–37)
Albumin: 4.5 g/dL (ref 3.5–5.2)
Alkaline Phosphatase: 73 U/L (ref 39–117)
BUN: 21 mg/dL (ref 6–23)
CO2: 28 mEq/L (ref 19–32)
Calcium: 10.4 mg/dL (ref 8.4–10.5)
Chloride: 99 mEq/L (ref 96–112)
Creatinine, Ser: 0.82 mg/dL (ref 0.40–1.20)
GFR: 70.1 mL/min (ref >60.00)
Glucose, Bld: 183 mg/dL — ABNORMAL HIGH (ref 70–99)
Potassium: 5.1 mEq/L (ref 3.5–5.1)
Sodium: 138 mEq/L (ref 135–145)
Total Bilirubin: 0.5 mg/dL (ref 0.2–1.2)
Total Protein: 7.1 g/dL (ref 6.0–8.3)

## 2022-04-17 LAB — HEMOGLOBIN A1C: Hgb A1c MFr Bld: 7.8 % — ABNORMAL HIGH (ref 4.6–6.5)

## 2022-04-27 ENCOUNTER — Other Ambulatory Visit: Payer: Self-pay | Admitting: Cardiovascular Disease

## 2022-04-27 NOTE — Telephone Encounter (Signed)
Prescription refill request for Xarelto received.  Indication: PAF Last office visit: 10/03/21  Estevan Ryder MD Weight:  89.8kg Age: 76 Scr: 0.82 on 04/16/22 CrCl: 84.04  Based on above findings Xarelto '20mg'$  daily is the appropriate dose.  Refill approved.

## 2022-05-01 ENCOUNTER — Encounter: Payer: Self-pay | Admitting: Family Medicine

## 2022-05-07 MED ORDER — METFORMIN HCL ER 500 MG PO TB24: 1000.0000 mg | ORAL_TABLET | Freq: Two times a day (BID) | ORAL | 3 refills | Status: AC

## 2022-05-08 DIAGNOSIS — M963 Postlaminectomy kyphosis: Secondary | ICD-10-CM | POA: Diagnosis not present

## 2022-05-08 DIAGNOSIS — G894 Chronic pain syndrome: Secondary | ICD-10-CM | POA: Diagnosis not present

## 2022-05-08 DIAGNOSIS — M4722 Other spondylosis with radiculopathy, cervical region: Secondary | ICD-10-CM | POA: Diagnosis not present

## 2022-05-08 DIAGNOSIS — M79645 Pain in left finger(s): Secondary | ICD-10-CM | POA: Diagnosis not present

## 2022-05-18 DIAGNOSIS — M25561 Pain in right knee: Secondary | ICD-10-CM | POA: Diagnosis not present

## 2022-05-18 DIAGNOSIS — M79672 Pain in left foot: Secondary | ICD-10-CM | POA: Diagnosis not present

## 2022-05-22 ENCOUNTER — Encounter: Payer: Self-pay | Admitting: Family Medicine

## 2022-06-04 ENCOUNTER — Encounter: Payer: Self-pay | Admitting: Family Medicine

## 2022-06-05 DIAGNOSIS — M79645 Pain in left finger(s): Secondary | ICD-10-CM | POA: Diagnosis not present

## 2022-06-05 DIAGNOSIS — G894 Chronic pain syndrome: Secondary | ICD-10-CM | POA: Diagnosis not present

## 2022-06-05 DIAGNOSIS — M963 Postlaminectomy kyphosis: Secondary | ICD-10-CM | POA: Diagnosis not present

## 2022-06-05 DIAGNOSIS — M4722 Other spondylosis with radiculopathy, cervical region: Secondary | ICD-10-CM | POA: Diagnosis not present

## 2022-06-05 MED ORDER — NYSTATIN 100000 UNIT/GM EX CREA: 1.0000 | TOPICAL_CREAM | Freq: Two times a day (BID) | CUTANEOUS | 0 refills | Status: AC

## 2022-06-05 MED ORDER — TERBINAFINE HCL 250 MG PO TABS: 250.0000 mg | ORAL_TABLET | Freq: Every day | ORAL | 0 refills | Status: AC

## 2022-06-05 NOTE — Addendum Note (Signed)
Addended by: Lamar Blinks C on: 06/05/2022 04:48 PM   Modules accepted: Orders

## 2022-07-05 DIAGNOSIS — M963 Postlaminectomy kyphosis: Secondary | ICD-10-CM | POA: Diagnosis not present

## 2022-07-05 DIAGNOSIS — M79645 Pain in left finger(s): Secondary | ICD-10-CM | POA: Diagnosis not present

## 2022-07-05 DIAGNOSIS — G894 Chronic pain syndrome: Secondary | ICD-10-CM | POA: Diagnosis not present

## 2022-07-05 DIAGNOSIS — M4722 Other spondylosis with radiculopathy, cervical region: Secondary | ICD-10-CM | POA: Diagnosis not present

## 2022-07-23 ENCOUNTER — Inpatient Hospital Stay (HOSPITAL_COMMUNITY): Payer: Medicare Other

## 2022-07-23 ENCOUNTER — Emergency Department (HOSPITAL_COMMUNITY): Payer: Medicare Other

## 2022-07-23 ENCOUNTER — Encounter (HOSPITAL_COMMUNITY): Payer: Self-pay

## 2022-07-23 ENCOUNTER — Inpatient Hospital Stay (HOSPITAL_COMMUNITY)
Admission: EM | Admit: 2022-07-23 | Discharge: 2022-08-22 | DRG: 871 | Disposition: E | Payer: Medicare Other | Attending: Internal Medicine | Admitting: Internal Medicine

## 2022-07-23 ENCOUNTER — Other Ambulatory Visit: Payer: Self-pay

## 2022-07-23 DIAGNOSIS — I499 Cardiac arrhythmia, unspecified: Secondary | ICD-10-CM | POA: Diagnosis not present

## 2022-07-23 DIAGNOSIS — J45909 Unspecified asthma, uncomplicated: Secondary | ICD-10-CM | POA: Diagnosis present

## 2022-07-23 DIAGNOSIS — Z4682 Encounter for fitting and adjustment of non-vascular catheter: Secondary | ICD-10-CM | POA: Diagnosis not present

## 2022-07-23 DIAGNOSIS — E785 Hyperlipidemia, unspecified: Secondary | ICD-10-CM | POA: Diagnosis present

## 2022-07-23 DIAGNOSIS — G8929 Other chronic pain: Secondary | ICD-10-CM | POA: Diagnosis present

## 2022-07-23 DIAGNOSIS — E739 Lactose intolerance, unspecified: Secondary | ICD-10-CM | POA: Diagnosis present

## 2022-07-23 DIAGNOSIS — R918 Other nonspecific abnormal finding of lung field: Secondary | ICD-10-CM | POA: Diagnosis not present

## 2022-07-23 DIAGNOSIS — J80 Acute respiratory distress syndrome: Secondary | ICD-10-CM | POA: Diagnosis not present

## 2022-07-23 DIAGNOSIS — R569 Unspecified convulsions: Secondary | ICD-10-CM | POA: Diagnosis not present

## 2022-07-23 DIAGNOSIS — G9341 Metabolic encephalopathy: Secondary | ICD-10-CM | POA: Diagnosis present

## 2022-07-23 DIAGNOSIS — E11649 Type 2 diabetes mellitus with hypoglycemia without coma: Secondary | ICD-10-CM | POA: Diagnosis not present

## 2022-07-23 DIAGNOSIS — Z6837 Body mass index (BMI) 37.0-37.9, adult: Secondary | ICD-10-CM | POA: Diagnosis not present

## 2022-07-23 DIAGNOSIS — I480 Paroxysmal atrial fibrillation: Secondary | ICD-10-CM | POA: Diagnosis present

## 2022-07-23 DIAGNOSIS — J9601 Acute respiratory failure with hypoxia: Secondary | ICD-10-CM

## 2022-07-23 DIAGNOSIS — E871 Hypo-osmolality and hyponatremia: Secondary | ICD-10-CM | POA: Diagnosis present

## 2022-07-23 DIAGNOSIS — Z800 Family history of malignant neoplasm of digestive organs: Secondary | ICD-10-CM

## 2022-07-23 DIAGNOSIS — D638 Anemia in other chronic diseases classified elsewhere: Secondary | ICD-10-CM | POA: Diagnosis not present

## 2022-07-23 DIAGNOSIS — I4891 Unspecified atrial fibrillation: Secondary | ICD-10-CM

## 2022-07-23 DIAGNOSIS — J189 Pneumonia, unspecified organism: Secondary | ICD-10-CM | POA: Diagnosis present

## 2022-07-23 DIAGNOSIS — Z20822 Contact with and (suspected) exposure to covid-19: Secondary | ICD-10-CM | POA: Diagnosis present

## 2022-07-23 DIAGNOSIS — L03116 Cellulitis of left lower limb: Secondary | ICD-10-CM | POA: Diagnosis present

## 2022-07-23 DIAGNOSIS — I110 Hypertensive heart disease with heart failure: Secondary | ICD-10-CM | POA: Diagnosis present

## 2022-07-23 DIAGNOSIS — E669 Obesity, unspecified: Secondary | ICD-10-CM | POA: Diagnosis present

## 2022-07-23 DIAGNOSIS — R456 Violent behavior: Secondary | ICD-10-CM | POA: Diagnosis not present

## 2022-07-23 DIAGNOSIS — A419 Sepsis, unspecified organism: Secondary | ICD-10-CM | POA: Diagnosis present

## 2022-07-23 DIAGNOSIS — Z7901 Long term (current) use of anticoagulants: Secondary | ICD-10-CM

## 2022-07-23 DIAGNOSIS — Z8249 Family history of ischemic heart disease and other diseases of the circulatory system: Secondary | ICD-10-CM

## 2022-07-23 DIAGNOSIS — Z515 Encounter for palliative care: Secondary | ICD-10-CM | POA: Diagnosis not present

## 2022-07-23 DIAGNOSIS — I10 Essential (primary) hypertension: Secondary | ICD-10-CM | POA: Diagnosis not present

## 2022-07-23 DIAGNOSIS — Z86711 Personal history of pulmonary embolism: Secondary | ICD-10-CM

## 2022-07-23 DIAGNOSIS — Z90710 Acquired absence of both cervix and uterus: Secondary | ICD-10-CM

## 2022-07-23 DIAGNOSIS — E1151 Type 2 diabetes mellitus with diabetic peripheral angiopathy without gangrene: Secondary | ICD-10-CM | POA: Diagnosis present

## 2022-07-23 DIAGNOSIS — Z888 Allergy status to other drugs, medicaments and biological substances status: Secondary | ICD-10-CM

## 2022-07-23 DIAGNOSIS — Z7984 Long term (current) use of oral hypoglycemic drugs: Secondary | ICD-10-CM

## 2022-07-23 DIAGNOSIS — Z96652 Presence of left artificial knee joint: Secondary | ICD-10-CM | POA: Diagnosis present

## 2022-07-23 DIAGNOSIS — K219 Gastro-esophageal reflux disease without esophagitis: Secondary | ICD-10-CM | POA: Diagnosis present

## 2022-07-23 DIAGNOSIS — Z7189 Other specified counseling: Secondary | ICD-10-CM | POA: Diagnosis not present

## 2022-07-23 DIAGNOSIS — E1165 Type 2 diabetes mellitus with hyperglycemia: Secondary | ICD-10-CM | POA: Diagnosis present

## 2022-07-23 DIAGNOSIS — E039 Hypothyroidism, unspecified: Secondary | ICD-10-CM | POA: Diagnosis present

## 2022-07-23 DIAGNOSIS — I998 Other disorder of circulatory system: Secondary | ICD-10-CM | POA: Diagnosis present

## 2022-07-23 DIAGNOSIS — Z79899 Other long term (current) drug therapy: Secondary | ICD-10-CM

## 2022-07-23 DIAGNOSIS — R0902 Hypoxemia: Secondary | ICD-10-CM | POA: Diagnosis not present

## 2022-07-23 DIAGNOSIS — I509 Heart failure, unspecified: Secondary | ICD-10-CM | POA: Diagnosis present

## 2022-07-23 DIAGNOSIS — R4182 Altered mental status, unspecified: Secondary | ICD-10-CM | POA: Diagnosis not present

## 2022-07-23 DIAGNOSIS — R6521 Severe sepsis with septic shock: Secondary | ICD-10-CM | POA: Diagnosis present

## 2022-07-23 DIAGNOSIS — Z86718 Personal history of other venous thrombosis and embolism: Secondary | ICD-10-CM

## 2022-07-23 DIAGNOSIS — E872 Acidosis, unspecified: Secondary | ICD-10-CM | POA: Diagnosis present

## 2022-07-23 DIAGNOSIS — Z79890 Hormone replacement therapy (postmenopausal): Secondary | ICD-10-CM

## 2022-07-23 DIAGNOSIS — B351 Tinea unguium: Secondary | ICD-10-CM | POA: Diagnosis present

## 2022-07-23 DIAGNOSIS — I2510 Atherosclerotic heart disease of native coronary artery without angina pectoris: Secondary | ICD-10-CM | POA: Diagnosis present

## 2022-07-23 DIAGNOSIS — Z66 Do not resuscitate: Secondary | ICD-10-CM | POA: Diagnosis present

## 2022-07-23 DIAGNOSIS — N179 Acute kidney failure, unspecified: Secondary | ICD-10-CM | POA: Diagnosis not present

## 2022-07-23 DIAGNOSIS — Z85828 Personal history of other malignant neoplasm of skin: Secondary | ICD-10-CM

## 2022-07-23 DIAGNOSIS — I872 Venous insufficiency (chronic) (peripheral): Secondary | ICD-10-CM | POA: Diagnosis present

## 2022-07-23 DIAGNOSIS — R34 Anuria and oliguria: Secondary | ICD-10-CM | POA: Diagnosis present

## 2022-07-23 DIAGNOSIS — R609 Edema, unspecified: Secondary | ICD-10-CM

## 2022-07-23 DIAGNOSIS — J9690 Respiratory failure, unspecified, unspecified whether with hypoxia or hypercapnia: Secondary | ICD-10-CM | POA: Diagnosis not present

## 2022-07-23 LAB — CBC WITH DIFFERENTIAL/PLATELET
Abs Immature Granulocytes: 0.28 10*3/uL — ABNORMAL HIGH (ref 0.00–0.07)
Basophils Absolute: 0.1 10*3/uL (ref 0.0–0.1)
Basophils Relative: 1 %
Eosinophils Absolute: 0 10*3/uL (ref 0.0–0.5)
Eosinophils Relative: 0 %
HCT: 41.4 % (ref 36.0–46.0)
Hemoglobin: 13.9 g/dL (ref 12.0–15.0)
Immature Granulocytes: 1 %
Lymphocytes Relative: 10 %
Lymphs Abs: 2 10*3/uL (ref 0.7–4.0)
MCH: 29 pg (ref 26.0–34.0)
MCHC: 33.6 g/dL (ref 30.0–36.0)
MCV: 86.4 fL (ref 80.0–100.0)
Monocytes Absolute: 0.9 10*3/uL (ref 0.1–1.0)
Monocytes Relative: 5 %
Neutro Abs: 16.5 10*3/uL — ABNORMAL HIGH (ref 1.7–7.7)
Neutrophils Relative %: 83 %
Platelets: 365 10*3/uL (ref 150–400)
RBC: 4.79 MIL/uL (ref 3.87–5.11)
RDW: 14.6 % (ref 11.5–15.5)
WBC: 19.8 10*3/uL — ABNORMAL HIGH (ref 4.0–10.5)
nRBC: 0 % (ref 0.0–0.2)

## 2022-07-23 LAB — I-STAT CHEM 8, ED
BUN: 113 mg/dL — ABNORMAL HIGH (ref 8–23)
Calcium, Ion: 1.1 mmol/L — ABNORMAL LOW (ref 1.15–1.40)
Chloride: 102 mmol/L (ref 98–111)
Creatinine, Ser: 6.7 mg/dL — ABNORMAL HIGH (ref 0.44–1.00)
Glucose, Bld: 64 mg/dL — ABNORMAL LOW (ref 70–99)
HCT: 40 % (ref 36.0–46.0)
Hemoglobin: 13.6 g/dL (ref 12.0–15.0)
Potassium: 5 mmol/L (ref 3.5–5.1)
Sodium: 131 mmol/L — ABNORMAL LOW (ref 135–145)
TCO2: 13 mmol/L — ABNORMAL LOW (ref 22–32)

## 2022-07-23 LAB — GLUCOSE, CAPILLARY
Glucose-Capillary: 117 mg/dL — ABNORMAL HIGH (ref 70–99)
Glucose-Capillary: 179 mg/dL — ABNORMAL HIGH (ref 70–99)
Glucose-Capillary: 65 mg/dL — ABNORMAL LOW (ref 70–99)
Glucose-Capillary: 89 mg/dL (ref 70–99)

## 2022-07-23 LAB — BASIC METABOLIC PANEL
Anion gap: 30 — ABNORMAL HIGH (ref 5–15)
Anion gap: 24 — ABNORMAL HIGH (ref 5–15)
BUN: 96 mg/dL — ABNORMAL HIGH (ref 8–23)
BUN: 90 mg/dL — ABNORMAL HIGH (ref 8–23)
CO2: 10 mmol/L — ABNORMAL LOW (ref 22–32)
CO2: 14 mmol/L — ABNORMAL LOW (ref 22–32)
Calcium: 9.4 mg/dL (ref 8.9–10.3)
Calcium: 7.8 mg/dL — ABNORMAL LOW (ref 8.9–10.3)
Chloride: 95 mmol/L — ABNORMAL LOW (ref 98–111)
Chloride: 96 mmol/L — ABNORMAL LOW (ref 98–111)
Creatinine, Ser: 6.44 mg/dL — ABNORMAL HIGH (ref 0.44–1.00)
Creatinine, Ser: 5.8 mg/dL — ABNORMAL HIGH (ref 0.44–1.00)
GFR, Estimated: 6 mL/min — ABNORMAL LOW (ref >60)
GFR, Estimated: 7 mL/min — ABNORMAL LOW (ref >60)
Glucose, Bld: 71 mg/dL (ref 70–99)
Glucose, Bld: 63 mg/dL — ABNORMAL LOW (ref 70–99)
Potassium: 5.2 mmol/L — ABNORMAL HIGH (ref 3.5–5.1)
Potassium: 5.3 mmol/L — ABNORMAL HIGH (ref 3.5–5.1)
Sodium: 135 mmol/L (ref 135–145)
Sodium: 134 mmol/L — ABNORMAL LOW (ref 135–145)

## 2022-07-23 LAB — I-STAT ARTERIAL BLOOD GAS, ED
Acid-base deficit: 20 mmol/L — ABNORMAL HIGH (ref 0.0–2.0)
Acid-base deficit: 18 mmol/L — ABNORMAL HIGH (ref 0.0–2.0)
Bicarbonate: 7.7 mmol/L — ABNORMAL LOW (ref 20.0–28.0)
Bicarbonate: 11.5 mmol/L — ABNORMAL LOW (ref 20.0–28.0)
Calcium, Ion: 1.13 mmol/L — ABNORMAL LOW (ref 1.15–1.40)
Calcium, Ion: 1.11 mmol/L — ABNORMAL LOW (ref 1.15–1.40)
HCT: 39 % (ref 36.0–46.0)
HCT: 32 % — ABNORMAL LOW (ref 36.0–46.0)
Hemoglobin: 13.3 g/dL (ref 12.0–15.0)
Hemoglobin: 10.9 g/dL — ABNORMAL LOW (ref 12.0–15.0)
O2 Saturation: 91 %
O2 Saturation: 85 %
Patient temperature: 100.8
Patient temperature: 99.8
Potassium: 5.1 mmol/L (ref 3.5–5.1)
Potassium: 4.9 mmol/L (ref 3.5–5.1)
Sodium: 126 mmol/L — ABNORMAL LOW (ref 135–145)
Sodium: 129 mmol/L — ABNORMAL LOW (ref 135–145)
TCO2: 8 mmol/L — ABNORMAL LOW (ref 22–32)
TCO2: 13 mmol/L — ABNORMAL LOW (ref 22–32)
pCO2 arterial: 24.1 mmHg — ABNORMAL LOW (ref 32–48)
pCO2 arterial: 40.7 mmHg (ref 32–48)
pH, Arterial: 7.121 — CL (ref 7.35–7.45)
pH, Arterial: 7.065 — CL (ref 7.35–7.45)
pO2, Arterial: 84 mmHg (ref 83–108)
pO2, Arterial: 72 mmHg — ABNORMAL LOW (ref 83–108)

## 2022-07-23 LAB — POCT I-STAT 7, (LYTES, BLD GAS, ICA,H+H)
Acid-base deficit: 13 mmol/L — ABNORMAL HIGH (ref 0.0–2.0)
Acid-base deficit: 10 mmol/L — ABNORMAL HIGH (ref 0.0–2.0)
Acid-base deficit: 12 mmol/L — ABNORMAL HIGH (ref 0.0–2.0)
Bicarbonate: 15 mmol/L — ABNORMAL LOW (ref 20.0–28.0)
Bicarbonate: 17 mmol/L — ABNORMAL LOW (ref 20.0–28.0)
Bicarbonate: 15.9 mmol/L — ABNORMAL LOW (ref 20.0–28.0)
Calcium, Ion: 1.03 mmol/L — ABNORMAL LOW (ref 1.15–1.40)
Calcium, Ion: 0.96 mmol/L — ABNORMAL LOW (ref 1.15–1.40)
Calcium, Ion: 0.89 mmol/L — CL (ref 1.15–1.40)
HCT: 34 % — ABNORMAL LOW (ref 36.0–46.0)
HCT: 32 % — ABNORMAL LOW (ref 36.0–46.0)
HCT: 34 % — ABNORMAL LOW (ref 36.0–46.0)
Hemoglobin: 11.6 g/dL — ABNORMAL LOW (ref 12.0–15.0)
Hemoglobin: 10.9 g/dL — ABNORMAL LOW (ref 12.0–15.0)
Hemoglobin: 11.6 g/dL — ABNORMAL LOW (ref 12.0–15.0)
O2 Saturation: 96 %
O2 Saturation: 99 %
O2 Saturation: 94 %
Patient temperature: 36.4
Patient temperature: 36.1
Patient temperature: 36.6
Potassium: 4.9 mmol/L (ref 3.5–5.1)
Potassium: 4.5 mmol/L (ref 3.5–5.1)
Potassium: 4.5 mmol/L (ref 3.5–5.1)
Sodium: 130 mmol/L — ABNORMAL LOW (ref 135–145)
Sodium: 130 mmol/L — ABNORMAL LOW (ref 135–145)
Sodium: 129 mmol/L — ABNORMAL LOW (ref 135–145)
TCO2: 16 mmol/L — ABNORMAL LOW (ref 22–32)
TCO2: 18 mmol/L — ABNORMAL LOW (ref 22–32)
TCO2: 17 mmol/L — ABNORMAL LOW (ref 22–32)
pCO2 arterial: 43 mmHg (ref 32–48)
pCO2 arterial: 41.4 mmHg (ref 32–48)
pCO2 arterial: 39.9 mmHg (ref 32–48)
pH, Arterial: 7.148 — CL (ref 7.35–7.45)
pH, Arterial: 7.217 — ABNORMAL LOW (ref 7.35–7.45)
pH, Arterial: 7.206 — ABNORMAL LOW (ref 7.35–7.45)
pO2, Arterial: 99 mmHg (ref 83–108)
pO2, Arterial: 137 mmHg — ABNORMAL HIGH (ref 83–108)
pO2, Arterial: 82 mmHg — ABNORMAL LOW (ref 83–108)

## 2022-07-23 LAB — TSH: TSH: 0.241 u[IU]/mL — ABNORMAL LOW (ref 0.350–4.500)

## 2022-07-23 LAB — HEPATIC FUNCTION PANEL
ALT: 61 U/L — ABNORMAL HIGH (ref 0–44)
AST: 128 U/L — ABNORMAL HIGH (ref 15–41)
Albumin: 2 g/dL — ABNORMAL LOW (ref 3.5–5.0)
Alkaline Phosphatase: 172 U/L — ABNORMAL HIGH (ref 38–126)
Bilirubin, Direct: 0.2 mg/dL (ref 0.0–0.2)
Indirect Bilirubin: 0.5 mg/dL (ref 0.3–0.9)
Total Bilirubin: 0.7 mg/dL (ref 0.3–1.2)
Total Protein: 5.1 g/dL — ABNORMAL LOW (ref 6.5–8.1)

## 2022-07-23 LAB — POC OCCULT BLOOD, ED: Fecal Occult Bld: NEGATIVE

## 2022-07-23 LAB — LACTIC ACID, PLASMA
Lactic Acid, Venous: >9 mmol/L (ref 0.5–1.9)
Lactic Acid, Venous: 8 mmol/L (ref 0.5–1.9)
Lactic Acid, Venous: 7.7 mmol/L (ref 0.5–1.9)

## 2022-07-23 LAB — APTT: aPTT: 75 seconds — ABNORMAL HIGH (ref 24–36)

## 2022-07-23 LAB — TYPE AND SCREEN
ABO/RH(D): O NEG
Antibody Screen: NEGATIVE

## 2022-07-23 LAB — BRAIN NATRIURETIC PEPTIDE: B Natriuretic Peptide: 312.3 pg/mL — ABNORMAL HIGH (ref 0.0–100.0)

## 2022-07-23 LAB — PROCALCITONIN: Procalcitonin: 44.06 ng/mL

## 2022-07-23 LAB — MRSA NEXT GEN BY PCR, NASAL: MRSA by PCR Next Gen: NOT DETECTED

## 2022-07-23 LAB — SARS CORONAVIRUS 2 BY RT PCR: SARS Coronavirus 2 by RT PCR: NEGATIVE

## 2022-07-23 LAB — TROPONIN I (HIGH SENSITIVITY)
Troponin I (High Sensitivity): 31 ng/L — ABNORMAL HIGH (ref <18)
Troponin I (High Sensitivity): 37 ng/L — ABNORMAL HIGH (ref <18)

## 2022-07-23 MED ORDER — SODIUM CHLORIDE 0.9 % IV BOLUS
1000.0000 mL | Freq: Once | INTRAVENOUS | Status: AC
  Administered 2022-07-23: 1000 mL via INTRAVENOUS

## 2022-07-23 MED ORDER — SODIUM BICARBONATE 8.4 % IV SOLN
100.0000 meq | Freq: Once | INTRAVENOUS | Status: AC
  Administered 2022-07-23: 100 meq via INTRAVENOUS
  Filled 2022-07-23: qty 100

## 2022-07-23 MED ORDER — LACTATED RINGERS IV BOLUS
500.0000 mL | Freq: Once | INTRAVENOUS | Status: AC
  Administered 2022-07-23: 500 mL via INTRAVENOUS

## 2022-07-23 MED ORDER — DOCUSATE SODIUM 100 MG PO CAPS: 100.0000 mg | ORAL_CAPSULE | Freq: Two times a day (BID) | ORAL | Status: DC | PRN

## 2022-07-23 MED ORDER — MIDAZOLAM HCL 2 MG/2ML IJ SOLN
INTRAMUSCULAR | Status: AC
  Administered 2022-07-23: 1 mg
  Filled 2022-07-23: qty 2

## 2022-07-23 MED ORDER — CALCIUM GLUCONATE-NACL 2-0.675 GM/100ML-% IV SOLN
2.0000 g | Freq: Once | INTRAVENOUS | Status: AC
  Administered 2022-07-24: 2000 mg via INTRAVENOUS
  Filled 2022-07-23: qty 100

## 2022-07-23 MED ORDER — ACETAMINOPHEN 650 MG RE SUPP
650.0000 mg | Freq: Once | RECTAL | Status: AC
  Administered 2022-07-23: 650 mg via RECTAL
  Filled 2022-07-23: qty 1

## 2022-07-23 MED ORDER — PROPOFOL 1000 MG/100ML IV EMUL
INTRAVENOUS | Status: AC
  Filled 2022-07-23: qty 100

## 2022-07-23 MED ORDER — DOCUSATE SODIUM 50 MG/5ML PO LIQD: 100.0000 mg | Freq: Two times a day (BID) | ORAL | Status: DC | PRN

## 2022-07-23 MED ORDER — LACTATED RINGERS IV BOLUS (SEPSIS)
1000.0000 mL | Freq: Once | INTRAVENOUS | Status: AC
  Administered 2022-07-23: 1000 mL via INTRAVENOUS

## 2022-07-23 MED ORDER — DEXTROSE 50 % IV SOLN
INTRAVENOUS | Status: AC
  Filled 2022-07-23: qty 50

## 2022-07-23 MED ORDER — DEXMEDETOMIDINE HCL IN NACL 400 MCG/100ML IV SOLN: 0.0000 ug/kg/h | INTRAVENOUS | Status: DC

## 2022-07-23 MED ORDER — FENTANYL CITRATE PF 50 MCG/ML IJ SOSY: 25.0000 ug | PREFILLED_SYRINGE | Freq: Once | INTRAMUSCULAR | Status: DC

## 2022-07-23 MED ORDER — SODIUM BICARBONATE 8.4 % IV SOLN
INTRAVENOUS | Status: DC
  Filled 2022-07-23 (×2): qty 1000
  Filled 2022-07-23: qty 150
  Filled 2022-07-23 (×5): qty 1000

## 2022-07-23 MED ORDER — SODIUM BICARBONATE 8.4 % IV SOLN
INTRAVENOUS | Status: AC
  Filled 2022-07-23: qty 50

## 2022-07-23 MED ORDER — SUCCINYLCHOLINE CHLORIDE 20 MG/ML IJ SOLN
INTRAMUSCULAR | Status: DC | PRN
  Administered 2022-07-23: 100 mg via INTRAVENOUS

## 2022-07-23 MED ORDER — ETOMIDATE 2 MG/ML IV SOLN
INTRAVENOUS | Status: DC | PRN
  Administered 2022-07-23: 20 mg via INTRAVENOUS

## 2022-07-23 MED ORDER — POLYETHYLENE GLYCOL 3350 17 G PO PACK: 17.0000 g | PACK | Freq: Every day | ORAL | Status: DC | PRN

## 2022-07-23 MED ORDER — IOHEXOL 350 MG/ML SOLN
100.0000 mL | Freq: Once | INTRAVENOUS | Status: AC | PRN
  Administered 2022-07-23: 100 mL via INTRAVENOUS

## 2022-07-23 MED ORDER — INSULIN ASPART 100 UNIT/ML IJ SOLN: 0.0000 [IU] | Freq: Three times a day (TID) | INTRAMUSCULAR | Status: DC

## 2022-07-23 MED ORDER — IPRATROPIUM-ALBUTEROL 0.5-2.5 (3) MG/3ML IN SOLN: 3.0000 mL | Freq: Four times a day (QID) | RESPIRATORY_TRACT | Status: DC | PRN

## 2022-07-23 MED ORDER — INSULIN ASPART 100 UNIT/ML IJ SOLN: 0.0000 [IU] | Freq: Every day | INTRAMUSCULAR | Status: DC

## 2022-07-23 MED ORDER — NOREPINEPHRINE 4 MG/250ML-% IV SOLN
0.0000 ug/min | INTRAVENOUS | Status: DC
  Administered 2022-07-23: 18 ug/min via INTRAVENOUS
  Administered 2022-07-23: 2 ug/min via INTRAVENOUS
  Administered 2022-07-23: 18 ug/min via INTRAVENOUS
  Filled 2022-07-23 (×4): qty 250

## 2022-07-23 MED ORDER — PANTOPRAZOLE SODIUM 40 MG IV SOLR
40.0000 mg | INTRAVENOUS | Status: DC
  Administered 2022-07-23 – 2022-07-26 (×4): 40 mg via INTRAVENOUS
  Filled 2022-07-23 (×4): qty 10

## 2022-07-23 MED ORDER — NOREPINEPHRINE 4 MG/250ML-% IV SOLN: 2.0000 ug/min | INTRAVENOUS | Status: DC

## 2022-07-23 MED ORDER — AMIODARONE LOAD VIA INFUSION
150.0000 mg | Freq: Once | INTRAVENOUS | Status: AC
  Administered 2022-07-24: 150 mg via INTRAVENOUS
  Filled 2022-07-23: qty 83.34

## 2022-07-23 MED ORDER — BIVALIRUDIN BOLUS VIA INFUSION: 0.1000 mg/kg | Freq: Once | INTRAVENOUS | Status: DC

## 2022-07-23 MED ORDER — AMIODARONE HCL IN DEXTROSE 360-4.14 MG/200ML-% IV SOLN
60.0000 mg/h | INTRAVENOUS | Status: AC
  Administered 2022-07-24 (×2): 60 mg/h via INTRAVENOUS
  Filled 2022-07-23 (×2): qty 200

## 2022-07-23 MED ORDER — FENTANYL 2500MCG IN NS 250ML (10MCG/ML) PREMIX INFUSION
25.0000 ug/h | INTRAVENOUS | Status: DC
  Administered 2022-07-23: 200 ug/h via INTRAVENOUS
  Administered 2022-07-23: 100 ug/h via INTRAVENOUS
  Administered 2022-07-23: 200 ug/h via INTRAVENOUS
  Filled 2022-07-23 (×3): qty 250

## 2022-07-23 MED ORDER — PROPOFOL 1000 MG/100ML IV EMUL
5.0000 ug/kg/min | INTRAVENOUS | Status: DC
  Administered 2022-07-23: 5 ug/kg/min via INTRAVENOUS
  Administered 2022-07-23: 30 ug/kg/min via INTRAVENOUS
  Administered 2022-07-24 – 2022-07-25 (×6): 20 ug/kg/min via INTRAVENOUS
  Administered 2022-07-26: 15 ug/kg/min via INTRAVENOUS
  Filled 2022-07-23 (×6): qty 100

## 2022-07-23 MED ORDER — FENTANYL BOLUS VIA INFUSION
25.0000 ug | INTRAVENOUS | Status: DC | PRN
  Administered 2022-07-23 (×2): 100 ug via INTRAVENOUS
  Administered 2022-07-23: 50 ug via INTRAVENOUS
  Administered 2022-07-23 – 2022-07-25 (×2): 100 ug via INTRAVENOUS
  Administered 2022-07-25 (×2): 50 ug via INTRAVENOUS
  Administered 2022-07-26 (×3): 100 ug via INTRAVENOUS

## 2022-07-23 MED ORDER — AMIODARONE HCL IN DEXTROSE 360-4.14 MG/200ML-% IV SOLN
30.0000 mg/h | INTRAVENOUS | Status: DC
  Administered 2022-07-24 – 2022-07-26 (×7): 30 mg/h via INTRAVENOUS
  Filled 2022-07-23 (×5): qty 200

## 2022-07-23 MED ORDER — PIPERACILLIN-TAZOBACTAM 3.375 G IVPB: 3.3750 g | Freq: Three times a day (TID) | INTRAVENOUS | Status: DC

## 2022-07-23 MED ORDER — RIVAROXABAN 20 MG PO TABS: 20.0000 mg | ORAL_TABLET | Freq: Every day | ORAL | Status: DC

## 2022-07-23 MED ORDER — SODIUM CHLORIDE 0.9 % IV SOLN
1.0000 g | Freq: Once | INTRAVENOUS | Status: AC
  Administered 2022-07-23: 1 g via INTRAVENOUS
  Filled 2022-07-23: qty 10

## 2022-07-23 MED ORDER — LACTATED RINGERS IV BOLUS (SEPSIS)
250.0000 mL | Freq: Once | INTRAVENOUS | Status: AC
  Administered 2022-07-23: 250 mL via INTRAVENOUS

## 2022-07-23 MED ORDER — PANTOPRAZOLE 2 MG/ML SUSPENSION
40.0000 mg | Freq: Every day | ORAL | Status: DC
  Filled 2022-07-23: qty 20

## 2022-07-23 MED ORDER — ARGATROBAN 50 MG/50ML IV SOLN
0.4000 ug/kg/min | INTRAVENOUS | Status: DC
  Administered 2022-07-23 – 2022-07-24 (×2): 0.5 ug/kg/min via INTRAVENOUS
  Filled 2022-07-23 (×4): qty 50

## 2022-07-23 MED ORDER — SODIUM CHLORIDE 0.9 % IV SOLN
250.0000 mL | INTRAVENOUS | Status: DC
  Administered 2022-07-23 – 2022-07-25 (×2): 250 mL via INTRAVENOUS

## 2022-07-23 MED ORDER — ARTIFICIAL TEARS OPHTHALMIC OINT
TOPICAL_OINTMENT | Freq: Three times a day (TID) | OPHTHALMIC | Status: DC
  Administered 2022-07-25 – 2022-07-26 (×2): 1 via OPHTHALMIC
  Filled 2022-07-23 (×3): qty 3.5

## 2022-07-23 MED ORDER — HEPARIN SODIUM (PORCINE) 5000 UNIT/ML IJ SOLN
5000.0000 [IU] | Freq: Three times a day (TID) | INTRAMUSCULAR | Status: DC
  Filled 2022-07-23: qty 1

## 2022-07-23 MED ORDER — DEXTROSE 50 % IV SOLN
25.0000 mL | Freq: Once | INTRAVENOUS | Status: AC
  Administered 2022-07-23: 25 mL via INTRAVENOUS

## 2022-07-23 MED ORDER — LACTATED RINGERS IV SOLN: INTRAVENOUS | Status: DC

## 2022-07-23 MED ORDER — PIPERACILLIN-TAZOBACTAM IN DEX 2-0.25 GM/50ML IV SOLN
2.2500 g | Freq: Three times a day (TID) | INTRAVENOUS | Status: DC
  Administered 2022-07-23 – 2022-07-25 (×6): 2.25 g via INTRAVENOUS
  Filled 2022-07-23 (×8): qty 50

## 2022-07-23 MED ORDER — IPRATROPIUM-ALBUTEROL 0.5-2.5 (3) MG/3ML IN SOLN
3.0000 mL | Freq: Four times a day (QID) | RESPIRATORY_TRACT | Status: DC
  Administered 2022-07-23 – 2022-07-26 (×13): 3 mL via RESPIRATORY_TRACT
  Filled 2022-07-23 (×13): qty 3

## 2022-07-23 MED ORDER — INSULIN ASPART 100 UNIT/ML IJ SOLN
0.0000 [IU] | INTRAMUSCULAR | Status: DC
  Administered 2022-07-24: 2 [IU] via SUBCUTANEOUS
  Administered 2022-07-24: 1 [IU] via SUBCUTANEOUS
  Administered 2022-07-24: 2 [IU] via SUBCUTANEOUS
  Administered 2022-07-24: 3 [IU] via SUBCUTANEOUS
  Administered 2022-07-24 (×3): 2 [IU] via SUBCUTANEOUS
  Administered 2022-07-25 (×2): 3 [IU] via SUBCUTANEOUS

## 2022-07-23 MED ORDER — SODIUM CHLORIDE 0.9 % IV SOLN
500.0000 mg | INTRAVENOUS | Status: DC
  Administered 2022-07-23 – 2022-07-24 (×2): 500 mg via INTRAVENOUS
  Filled 2022-07-23 (×3): qty 5

## 2022-07-23 NOTE — Progress Notes (Addendum)
ABG results reported to MD at this time.

## 2022-07-23 NOTE — Progress Notes (Signed)
Pharmacy Antibiotic Note  Latoya [name] is a 76 y.o. female admitted on 08/16/2022 presenting with AMS, SOB, concern for aspiration pna.  Pharmacy has been consulted for zosyn dosing.  Plan: Zosyn 2.25g IV q 8 hours Monitor renal function, clinical progression and LOT  Height: '5\' 2"'$  (157.5 cm) Weight: 74.8 kg (165 lb) IBW/kg (Calculated) : 50.1  Temp (24hrs), Avg:98.3 F (36.8 C), Min:95.9 F (35.5 C), Max:100.8 F (38.2 C)  Recent Labs  Lab 07/27/2022 0807 08/13/2022 0828 07/28/2022 1034  WBC 19.8*  --   --   CREATININE 6.44* 6.70*  --   LATICACIDVEN >9.0*  --  8.0*    Estimated Creatinine Clearance: 6.9 mL/min (A) (by C-G formula based on SCr of 6.7 mg/dL (H)).    Allergies  Allergen Reactions   Chlorhexidine Anaphylaxis and Itching    Sensitive to dye in Betadine & Chlorohexadine    Enoxaparin Sodium Anaphylaxis   Heparin Anaphylaxis   Hornet Venom Anaphylaxis    European Hornets   Pork-Derived Products Anaphylaxis   Povidone-Iodine Itching and Rash    NOTE: Patient has had anaphylactic reaction to Betadine due to dyes in product. Sensitivity-" but if its wiped off she is able to tolerate betadine"    Indomethacin Rash   Lactose Intolerance (Gi) Diarrhea and Nausea And Vomiting    Can tolerate most [place] cheeses (Takes Lactaid)   Phenylpropanolamine Hypertension    Bertis Ruddy, PharmD Clinical Pharmacist ED Pharmacist Phone # [phone] 08/07/2022 11:54 AM

## 2022-07-23 NOTE — Progress Notes (Addendum)
[facility] Progress Note Patient Name: Litsy Epting [facility] DOB: 03/09/1946 MRN: [id number]   Date of Service  07/22/2022  HPI/Events of Note  Notified of rapid atrial fibrillation with rate in the 140s-150s.   Pt with prior hx of atrial fibrillation.  She is on levophed. She is also on argatroban.   Crea 5.8, ionized calcium 0.89.  eICU Interventions  Give amiodarone '150mg'$  IV bolus and start on amiodarone gtt.  Replete calcium.      Intervention Category Intermediate Interventions: Arrhythmia - evaluation and management  Elsie Lincoln 08/17/2022, 11:50 PM  1:44 AM Notified of hypotension with BP 83/38.    Plan> Pt started on vasopressin.  Levophed requirements are down with HR in the 110s-120s at this time. Continue titrating levophed to maintain MAP >65.

## 2022-07-23 NOTE — ED Triage Notes (Signed)
Patient coming from home. LKW is Friday. Patient has been altered since Saturday. This AM patient slid out of bed and was found down by husband. Patient oxygen was 60 RA. Patient coming in on nonrebreather. Patient alert to self. Patient has dark vomit on self and around nose.

## 2022-07-23 NOTE — ED Provider Notes (Signed)
[facility] EMERGENCY DEPARTMENT Provider Note   CSN: [id number] Arrival date & time: 08/15/2022  0751     History  Chief Complaint  Patient presents with   Shortness of Breath    Latoya [name] is a 76 y.o. female.  Pt is a 76  yo female with a pmhx significant for asthma, PE, DVT, afib on Xarelto, CAD, HLD, antiphospholipid syndrome, hypothyroidism, UTIs, skin cancer, GERD, and anemia.  Report initially obtained from EMS.  They report that pt was last normal on Friday, 9/29.  Pt has not gotten out of bed all weekend.  She has not been making sense.  She slid out of bed this am and EMS was called.  When they arrived, her O2 sat was 60% on RA.  She was in afib with rvr.  Pt placed on a NRB.  EMS unable to establish IV access.  Husband now here.  He said she has been complaining that her feet were hurting.  They normally look bad, but they are a lot worse.  She's had a cough.  She has not been eating or drinking.  She has been "talking out of her head."  Pt is unable to give any hx upon arrival.         Home Medications Prior to Admission medications   Medication Sig Start Date End Date Taking? Authorizing Provider  Azelastine HCl 137 MCG/SPRAY SOLN USE 1 TO 2 SPRAYS IN EACH NOSTRIL TWICE A DAY. Patient taking differently: Place 1-2 sprays into both nostrils 2 (two) times daily as needed (allergies). 02/28/22   Copland, Gay Filler, MD  Calcium Carb-Cholecalciferol 500-400 MG-UNIT TABS Take 1 tablet by mouth 2 (two) times daily.    [provider]  CLARITIN-D 12 HOUR 5-120 MG tablet TAKE 1 TABLET EVERY TWELVE HOURS AS NEEDED. Patient taking differently: Take 1 tablet by mouth 2 (two) times daily as needed for allergies. 09/01/21   Copland, Gay Filler, MD  cyclobenzaprine (FLEXERIL) 5 MG tablet Take 5 mg by mouth 3 (three) times daily as needed for muscle spasms. 12/13/20   [provider]  docusate sodium (COLACE) 100 MG capsule Take 200 mg by mouth 2 (two)  times daily.    [provider]  EPINEPHrine 0.3 mg/0.3 mL IJ SOAJ injection INJECT INTO THE MIDDLE OF THE OUTER THIGH AND HOLD FOR 3 SECONDS AS NEEDED FOR SEVERE ALLERGIC REACTION THEN CALL 911 IF USED. Patient taking differently: Inject 0.3 mg into the muscle as needed for anaphylaxis. 02/27/22   Copland, Gay Filler, MD  esomeprazole (NEXIUM) 40 MG capsule Take 1 capsule (40 mg total) by mouth 2 (two) times daily before a meal. 01/29/22 01/29/23  Cirigliano, Vito V, DO  FLOVENT HFA 110 MCG/ACT inhaler USE 1-2 PUFFS TWICE DAILY. Patient taking differently: Inhale 1-2 puffs into the lungs 2 (two) times daily as needed (wheezing, shortness of breath). 03/30/22   Copland, Gay Filler, MD  furosemide (LASIX) 40 MG tablet TAKE 1 TABLET ONCE DAILY. 01/05/22   Burnell Blanks, MD  gabapentin (NEURONTIN) 600 MG tablet Take 600-750 mg by mouth at bedtime.  08/03/16   [provider]  Glucosamine-Chondroitin 750-600 MG TABS Take 2 tablets by mouth 2 (two) times daily.     [provider]  HYDROmorphone (DILAUDID) 4 MG tablet Take 4 mg by mouth every 4 (four) hours as needed (pain). 02/20/18   [provider]  levothyroxine (SYNTHROID) 100 MCG tablet Take 1 tablet (100 mcg total) by mouth  daily before breakfast. 03/30/22   Copland, Gay Filler, MD  losartan (COZAAR) 25 MG tablet Take 0.5 tablets (12.5 mg total) by mouth daily. 10/25/21   Copland, Gay Filler, MD  Melatonin 5 MG TABS Take 2.5-5 mg by mouth at bedtime as needed.    [provider]  metFORMIN (GLUCOPHAGE-XR) 500 MG 24 hr tablet Take 2 tablets (1,000 mg total) by mouth 2 (two) times daily. 05/07/22   Copland, Gay Filler, MD  metoprolol tartrate (LOPRESSOR) 100 MG tablet TAKE 1 TABLET BY MOUTH TWICE DAILY. Patient taking differently: Take 100 mg by mouth 2 (two) times daily. 12/15/21   Burnell Blanks, MD  metroNIDAZOLE (METROGEL) 1 % gel Apply topically daily. Use as needed for rash on your chest 04/16/22    Copland, Gay Filler, MD  montelukast (SINGULAIR) 10 MG tablet TAKE ONE TABLET BY MOUTH AT BEDTIME. Patient taking differently: Take 10 mg by mouth at bedtime. 03/23/22   Copland, Gay Filler, MD  Multiple Vitamin (MULTIVITAMIN WITH MINERALS) TABS Take 1 tablet by mouth daily.    [provider]  nitroGLYCERIN (NITROSTAT) 0.4 MG SL tablet DISSOLVE 1 TABLET UNDER TONGUE AS NEEDED FOR CHEST PAIN,MAY REPEAT IN 5 MINUTES FOR 2 DOSES. Patient taking differently: Place 0.4 mg under the tongue every 5 (five) minutes as needed for chest pain. 02/23/22   Burnell Blanks, MD  nystatin (MYCOSTATIN/NYSTOP) powder Apply 1 Application topically 3 (three) times daily as needed for irritation (rash). 06/29/22   [provider]  nystatin cream (MYCOSTATIN) Apply 1 Application topically 2 (two) times daily. Use as needed for rash 06/05/22   Copland, Gay Filler, MD  potassium chloride (KLOR-CON) 10 MEQ tablet TAKE 4 TABLETS TWICE DAILY. Patient taking differently: Take 40 mEq by mouth 2 (two) times daily. 10/13/21   Burnell Blanks, MD  rosuvastatin (CRESTOR) 20 MG tablet TAKE ONE TABLET ONCE DAILY Patient taking differently: Take 20 mg by mouth daily. 03/16/22   Burnell Blanks, MD  terbinafine (LAMISIL) 250 MG tablet Take 1 tablet (250 mg total) by mouth daily. 06/05/22   Copland, Gay Filler, MD  XARELTO 20 MG TABS tablet Take 1 tablet (20 mg total) by mouth daily with supper. 04/27/22   Burnell Blanks, MD      Allergies    Chlorhexidine, Enoxaparin sodium, Heparin, Hornet venom, Pork-derived products, Povidone-iodine, Indomethacin, Lactose intolerance (gi), and Phenylpropanolamine    Review of Systems   Review of Systems  Unable to perform ROS: Mental status change  All other systems reviewed and are negative.   Physical Exam Updated Vital Signs BP (!) 82/50   Pulse (!) 110   Temp 97.9 F (36.6 C) (Core)   Resp (!) 22   Ht '5\' 2"'$  (1.575 m)   Wt 74.8 kg   SpO2 (!)  85%   BMI 30.18 kg/m  Physical Exam Vitals and nursing note reviewed. Exam conducted with a chaperone present.  Constitutional:      General: She is in acute distress.     Appearance: She is obese. She is ill-appearing and toxic-appearing.  HENT:     Head: Normocephalic.     Mouth/Throat:     Comments: Black emesis from mouth Eyes:     Pupils: Pupils are equal, round, and reactive to light.  Neck:     Vascular: No JVD.  Cardiovascular:     Rate and Rhythm: Tachycardia present. Rhythm irregular.     Pulses:  Dorsalis pedis pulses are 0 on the right side and 0 on the left side.       Posterior tibial pulses are 0 on the right side and 0 on the left side.  Pulmonary:     Effort: Tachypnea and respiratory distress present.     Breath sounds: Rhonchi present.  Abdominal:     General: Bowel sounds are normal.     Palpations: Abdomen is soft.  Genitourinary:    Rectum: Guaiac result negative.  Musculoskeletal:     Right lower leg: Edema present.     Left lower leg: Edema present.     Comments: BLE with no deformities.   Skin:    Capillary Refill: Capillary refill takes 2 to 3 seconds.     Comments: BLE with cyanosis and mottling up to buttocks.  See pictures.  Neurological:     Mental Status: She is disoriented.     Comments: Pt unable to tell me her name.  She is making garbled speech.  ? Neglect on left, but she is able to move her left arm and leg.  Unable to assess visual acuity due to mental status.      ED Results / Procedures / Treatments   Labs (all labs ordered are listed, but only abnormal results are displayed) Labs Reviewed  BASIC METABOLIC PANEL - Abnormal; Notable for the following components:      Result Value   Potassium 5.2 (*)    Chloride 95 (*)    CO2 10 (*)    BUN 96 (*)    Creatinine, Ser 6.44 (*)    GFR, Estimated 6 (*)    Anion gap 30 (*)    All other components within normal limits  CBC WITH DIFFERENTIAL/PLATELET - Abnormal; Notable  for the following components:   WBC 19.8 (*)    Neutro Abs 16.5 (*)    Abs Immature Granulocytes 0.28 (*)    All other components within normal limits  BRAIN NATRIURETIC PEPTIDE - Abnormal; Notable for the following components:   B Natriuretic Peptide 312.3 (*)    All other components within normal limits  LACTIC ACID, PLASMA - Abnormal; Notable for the following components:   Lactic Acid, Venous >9.0 (*)    All other components within normal limits  I-STAT ARTERIAL BLOOD GAS, ED - Abnormal; Notable for the following components:   pH, Arterial 7.121 (*)    pCO2 arterial 24.1 (*)    Bicarbonate 7.7 (*)    TCO2 8 (*)    Acid-base deficit 20.0 (*)    Sodium 126 (*)    Calcium, Ion 1.13 (*)    All other components within normal limits  I-STAT CHEM 8, ED - Abnormal; Notable for the following components:   Sodium 131 (*)    BUN 113 (*)    Creatinine, Ser 6.70 (*)    Glucose, Bld 64 (*)    Calcium, Ion 1.10 (*)    TCO2 13 (*)    All other components within normal limits  I-STAT ARTERIAL BLOOD GAS, ED - Abnormal; Notable for the following components:   pH, Arterial 7.065 (*)    pO2, Arterial 72 (*)    Bicarbonate 11.5 (*)    TCO2 13 (*)    Acid-base deficit 18.0 (*)    Sodium 129 (*)    Calcium, Ion 1.11 (*)    HCT 32.0 (*)    Hemoglobin 10.9 (*)    All other components within normal limits  TROPONIN I (  HIGH SENSITIVITY) - Abnormal; Notable for the following components:   Troponin I (High Sensitivity) 31 (*)    All other components within normal limits  SARS CORONAVIRUS 2 BY RT PCR  CULTURE, BLOOD (ROUTINE X 2)  CULTURE, BLOOD (ROUTINE X 2)  LACTIC ACID, PLASMA  TSH  URINALYSIS, ROUTINE W REFLEX MICROSCOPIC  CBC  CREATININE, SERUM  POC OCCULT BLOOD, ED  TYPE AND SCREEN  TROPONIN I (HIGH SENSITIVITY)    EKG EKG Interpretation  Date/Time:  Monday July 23 2022 08:04:13 EDT Ventricular Rate:  169 PR Interval:    QRS Duration: 92 QT Interval:  236 QTC  Calculation: 396 R Axis:   77 Text Interpretation: Atrial fibrillation with rapid V-rate Repolarization abnormality, prob rate related No significant change since last tracing Confirmed by Isla Pence [phone]) on 07/27/2022 10:14:04 AM  Radiology CT Angio Aortobifemoral W and/or Wo Contrast  Result Date: 07/31/2022 CLINICAL DATA:  Altered mental status Vomiting Nonpalpable lower extremity pulses Discolored lower extremities below the knee EXAM: CT ANGIOGRAPHY OF ABDOMINAL AORTA WITH ILIOFEMORAL RUNOFF TECHNIQUE: Multidetector CT imaging of the abdomen, pelvis and lower extremities was performed using the standard protocol during bolus administration of intravenous contrast. Multiplanar CT image reconstructions and MIPs were obtained to evaluate the vascular anatomy. RADIATION DOSE REDUCTION: This exam was performed according to the departmental dose-optimization program which includes automated exposure control, adjustment of the mA and/or kV according to patient size and/or use of iterative reconstruction technique. CONTRAST:  100 mL OMNIPAQUE IOHEXOL 350 MG/ML SOLN COMPARISON:  None available FINDINGS: VASCULAR Aorta: Normal caliber aorta without aneurysm, dissection, vasculitis or significant stenosis. Celiac: Patent without evidence of aneurysm, dissection, vasculitis or significant stenosis. SMA: Patent without evidence of aneurysm, dissection, vasculitis or significant stenosis. Renals: Both renal arteries are patent without evidence of aneurysm, dissection, vasculitis, fibromuscular dysplasia or significant stenosis. IMA: Patent without evidence of aneurysm, dissection, vasculitis or significant stenosis. RIGHT Lower Extremity Inflow: Mild diffuse calcified plaque of the common iliac artery. Evaluation for stenosis is limited due to streak artifact from spine hardware. Evaluation of the origin of the internal iliac artery is limited due to streak artifact from spine hardware. The internal iliac  artery branches are patent. Evaluation of the origin of the external iliac artery is limited due to streak artifact. It is otherwise patent. Outflow: Mild calcified plaque in the common femoral artery without flow-limiting stenosis. Profunda femoris artery is patent without significant stenosis. Minimal focal calcified plaque of the distal superficial femoral artery without flow-limiting stenosis. Popliteal artery is diminutive but patent without significant stenosis. Runoff: Patent three vessel runoff to the ankle. LEFT Lower Extremity Inflow: Common, internal and external iliac arteries are patent without evidence of aneurysm, dissection, vasculitis or significant stenosis. Outflow: No significant stenosis of the common femoral artery. The profundus femoris artery is patent. There is mild stenosis at the origin due to focal calcified plaque. The superficial femoral artery is patent no significant stenosis. Evaluation of the left popliteal artery is significantly limited due to streak artifact from left total knee prosthesis. The distal popliteal artery is patent. Runoff: Tibioperoneal trunk is mildly narrowed due to calcified plaque. There is mild to moderate stenosis at the origin of the posterior tibial artery due to calcified plaque. There is three-vessel runoff to the left ankle. Veins: Right common femoral vein approach central venous catheter terminates in the common iliac vein. Review of the MIP images confirms the above findings. NON-VASCULAR Lower chest: Airspace opacity within the lower lobes bilaterally may  be due to atelectasis or pneumonia. Bronchiectasis also noted in the right middle lobe with adjacent airspace opacity. Hepatobiliary: Visualized portions the liver are unremarkable. Postsurgical changes of cholecystectomy are seen. No bile duct dilatation. Pancreas: Unremarkable. No pancreatic ductal dilatation or surrounding inflammatory changes. Spleen: Normal in size without focal abnormality.  Adrenals/Urinary Tract: Adrenal glands are unremarkable, evaluation of the kidneys, particularly the left side is limited due to streak artifact as well as motion. No hydronephrosis or hydroureter. The bladder is collapsed around a Foley catheter balloon which limits evaluation. Stomach/Bowel: Small hiatal hernia. Small duodenal diverticulum. No bowel dilatation to indicate ileus or obstruction. Appendix is not definitively identified, however no right lower quadrant phlegm a tori changes are present. Evaluation of the bowel is limited due to respiratory motion. Scattered colonic diverticula are present without evidence of acute diverticulitis. Lymphatic: No enlarged abdominal or pelvic lymph nodes. Reproductive: Uterus is either atrophic or absent. It is not well visualized. No adnexal abnormalities. Other: Bilateral breast prostheses are partially visualized. Single surgical clip noted in the left rectus sheath. Musculoskeletal: Long segment spinal fusion changes again seen. Left total knee prosthesis is seen. No acute osseous abnormality. Limitations: Evaluation limited due to respiratory motion and streak artifact. IMPRESSION: 1. Mild to moderate stenosis at the origin of the left posterior tibial artery. Otherwise, no flow-limiting stenosis of the lower extremity arteries. Three-vessel runoff to the ankles bilaterally. 2. No significant abnormality of the abdominal aorta or mesenteric arteries. NON-VASCULAR 1. Bilateral lower lobe and right middle lobe airspace opacities, suspicious for multifocal pneumonia. 2. Colonic diverticulosis. Electronically Signed   By: Miachel Roux M.D.   On: 08/16/2022 10:41   CT Head Wo Contrast  Result Date: 08/14/2022 CLINICAL DATA:  Altered mental status.  Patient found on floor. EXAM: CT HEAD WITHOUT CONTRAST TECHNIQUE: Contiguous axial images were obtained from the base of the skull through the vertex without intravenous contrast. RADIATION DOSE REDUCTION: This exam was  performed according to the departmental dose-optimization program which includes automated exposure control, adjustment of the mA and/or kV according to patient size and/or use of iterative reconstruction technique. COMPARISON:  CT head 09/16/2009 FINDINGS: Of note, streak artifact from dental hardware obscures posterior fossa. Brain: No evidence of acute infarction, hemorrhage, hydrocephalus, extra-axial collection or mass lesion/mass effect. Generalized parenchymal volume loss, within normal limits for patient age. Scattered hypodensities in the periventricular and subcortical white matter, consistent with chronic small-vessel ischemic changes. Vascular: Vascular calcifications at the skull base no hyperdense vessel or unexpected calcification. Skull: Hyperostosis frontalis interna. Negative for fracture or focal lesion. Sinuses/Orbits: No acute finding. Trace mucosal thickening in the right maxillary sinus. The other visualized paranasal sinuses and mastoid air cells are clear. Hypoplastic right frontal sinus. Other: None. IMPRESSION: No acute intracranial abnormality. Generalized parenchymal volume loss and chronic small-vessel ischemic changes. Electronically Signed   By: Ileana Roup M.D.   On: 08/14/2022 10:14   DG Chest Port 1 View  Result Date: 08/02/2022 CLINICAL DATA:  Shortness of breath.  Respiratory distress. EXAM: PORTABLE CHEST 1 VIEW COMPARISON:  Chest radiograph 12/03/2018 and earlier FINDINGS: Of note, the patient is mildly rotated and tilted on this portable examination. Patchy airspace opacities seen throughout the right lung as well as in the left lung base. No pneumothorax or large volume pleural effusion. Stable elevation of the right hemidiaphragm. Heart size is difficult to assess due to adjacent consolidations but appears stable. Partially visualized anterior cervical spinal fixation and posterior bilateral pedicle screw and rod fixation  of the distal thoracic and lumbar spine.  Surgical clips in the right upper abdomen. IMPRESSION: Multiple bilateral patchy airspace opacities, right-greater-than-left, concerning for multifocal pneumonia. Electronically Signed   By: Ileana Roup M.D.   On: 08/19/2022 08:23    Procedures Date/Time: 08/03/2022 9:09 AM  Performed by: Isla Pence, MDLaryngoscope Size: Glidescope and 3 Tube size: 7.5 mm Number of attempts: 1 Placement Confirmation: ETT inserted through vocal cords under direct vision, Breath sounds checked- equal and bilateral and Positive ETCO2 Secured at: 23 cm Tube secured with: ETT holder Dental Injury: Teeth and Oropharynx as per pre-operative assessment     .Central Line  Date/Time: 08/03/2022 9:10 AM  Performed by: Isla Pence, MD Authorized by: Isla Pence, MD   Consent:    Consent obtained:  Emergent situation Universal protocol:    Patient identity confirmed:  Arm band Pre-procedure details:    Indication(s): central venous access and insufficient peripheral access     Hand hygiene: Hand hygiene performed prior to insertion     Sterile barrier technique: All elements of maximal sterile technique followed     Skin preparation:  Chlorhexidine   Skin preparation agent: Skin preparation agent completely dried prior to procedure   Sedation:    Sedation type:  Deep Procedure details:    Location:  R femoral   Patient position:  Supine   Procedural supplies:  Triple lumen   Catheter size:  7 Fr   Landmarks identified: yes     Ultrasound guidance: no     Number of attempts:  1   Successful placement: yes   Post-procedure details:    Post-procedure:  Dressing applied and line sutured   Assessment:  Blood return through all ports and free fluid flow   Procedure completion:  Tolerated well, no immediate complications     Medications Ordered in ED Medications  fentaNYL (SUBLIMAZE) injection 25 mcg (25 mcg Intravenous Not Given 07/27/2022 0840)  fentaNYL 2571mg in NS 2569m(1080mml)  infusion-PREMIX (200 mcg/hr Intravenous New Bag/Given 08/12/2022 0934)  fentaNYL (SUBLIMAZE) bolus via infusion 25-100 mcg (100 mcg Intravenous Bolus from Bag 08/18/2022 1003)  etomidate (AMIDATE) injection (20 mg Intravenous Given 08/17/2022 0824)  succinylcholine (ANECTINE) injection (100 mg Intravenous Given 08/16/2022 0825)  lactated ringers infusion ( Intravenous New Bag/Given 08/13/2022 1030)  azithromycin (ZITHROMAX) 500 mg in sodium chloride 0.9 % 250 mL IVPB (0 mg Intravenous Stopped 08/15/2022 1056)  propofol (DIPRIVAN) 1000 MG/100ML infusion (0 mcg/kg/min  74.8 kg Intravenous Hold 07/22/2022 1019)  norepinephrine (LEVOPHED) '4mg'$  in 250m9m.016 mg/mL) premix infusion (2 mcg/min Intravenous New Bag/Given 08/05/2022 1025)  docusate sodium (COLACE) capsule 100 mg (has no administration in time range)  polyethylene glycol (MIRALAX / GLYCOLAX) packet 17 g (has no administration in time range)  heparin injection 5,000 Units (has no administration in time range)  pantoprazole (PROTONIX) 2 mg/mL oral suspension 40 mg (has no administration in time range)  0.9 %  sodium chloride infusion (has no administration in time range)  norepinephrine (LEVOPHED) '4mg'$  in 250mL75m016 mg/mL) premix infusion (has no administration in time range)  ipratropium-albuterol (DUONEB) 0.5-2.5 (3) MG/3ML nebulizer solution 3 mL (has no administration in time range)  ipratropium-albuterol (DUONEB) 0.5-2.5 (3) MG/3ML nebulizer solution 3 mL (has no administration in time range)  acetaminophen (TYLENOL) suppository 650 mg (650 mg Rectal Given 08/10/2022 0858)  sodium chloride 0.9 % bolus 1,000 mL (0 mLs Intravenous Stopped 08/01/2022 0908)  cefTRIAXone (ROCEPHIN) 1 g in sodium chloride 0.9 % 100 mL IVPB (0 g  Intravenous Stopped 08/20/2022 0857)  lactated ringers bolus 1,000 mL (0 mLs Intravenous Stopped 08/21/2022 1019)    And  lactated ringers bolus 1,000 mL (0 mLs Intravenous Stopped 07/22/2022 1055)    And  lactated ringers bolus 250 mL (250 mLs  Intravenous New Bag/Given 08/15/2022 1031)  cefTRIAXone (ROCEPHIN) 1 g in sodium chloride 0.9 % 100 mL IVPB (0 g Intravenous Stopped 07/22/2022 0946)  iohexol (OMNIPAQUE) 350 MG/ML injection 100 mL (100 mLs Intravenous Contrast Given 08/07/2022 0958)  midazolam (VERSED) 2 MG/2ML injection (1 mg  Given 07/22/2022 1010)    ED Course/ Medical Decision Making/ A&P                           Medical Decision Making Amount and/or Complexity of Data Reviewed Labs: ordered. Radiology: ordered.  Risk OTC drugs. Prescription drug management. Decision regarding hospitalization.   This patient presents to the ED for concern of ams, this involves an extensive number of treatment options, and is a complaint that carries with it a high risk of complications and morbidity.  The differential diagnosis includes cva, sepsis, electrolyte abn, anemia   Co morbidities that complicate the patient evaluation  asthma, PE, DVT, afib on Xarelto, CAD, HLD, antiphospholipid syndrome, hypothyroidism, UTIs, skin cancer, GERD, and anemia   Additional history obtained:  Additional history obtained from epic chart review External records from outside source obtained and reviewed including EMS report, husband   Lab Tests:  I Ordered, and personally interpreted labs.  The pertinent results include:  cbc with wbc elevated at 19.8, lactic elevated >9, abg with ph 7.12, pco2 24 and O2 84 on 100% nrb; bmp with k elevated at 5.2, co2 low at 10, bun 96 and cr 6.44; covid neg   Imaging Studies ordered:  I ordered imaging studies including cxr  I independently visualized and interpreted imaging which showed  CXR: IMPRESSION:  Multiple bilateral patchy airspace opacities,  right-greater-than-left, concerning for multifocal pneumonia.  CT aorta: IMPRESSION:  1. Mild to moderate stenosis at the origin of the left posterior  tibial artery. Otherwise, no flow-limiting stenosis of the lower  extremity arteries. Three-vessel  runoff to the ankles bilaterally.  2. No significant abnormality of the abdominal aorta or mesenteric  arteries.    NON-VASCULAR    1. Bilateral lower lobe and right middle lobe airspace opacities,  suspicious for multifocal pneumonia.  2. Colonic diverticulosis.   I agree with the radiologist interpretation   Cardiac Monitoring:  The patient was maintained on a cardiac monitor.  I personally viewed and interpreted the cardiac monitored which showed an underlying rhythm of: afib with rvr   Medicines ordered and prescription drug management:  I ordered medication including IVFs  for dehydration, abx for sepsis  Reevaluation of the patient after these medicines showed that the patient improved I have reviewed the patients home medicines and have made adjustments as needed   Test Considered:  ct   Critical Interventions:  Ivfs, iv abx   Consultations Obtained:  I requested consultation with critical care,  and discussed lab and imaging findings as well as pertinent plan - they will admit.   Problem List / ED Course:  Resp failure:  pt's resp status has worsened, so she was intubated.  Unfortunately, pt's resp status is not improving despite intubation.  ABG is worsening.  RT increased PEEP.  She is on 100%.  CCM consulted.  Covid is pending. Sepsis:  code sepsis called.  Pt given IV rocephin/zithromax.  Sepsis from multifocal pna and leg cellulitis. Afib with rvr:  I did not cardiovert her initially as I thought rapid rate and hypotension was from sepsis. Also, she has not been taking her Xarelto because she's been sick. HR is coming down with hydration and tylenol. Acute renal failure:  likely prerenal.  IVFs given. Hypotension:  bp is dropping despite IVFs.  So, pt started on levophed.  Cellulitis:  pt given rocephin. CTA did not show an arterial occlusion.  Reevaluation:  After the interventions noted above, I reevaluated the patient and found that they have  :improved   Social Determinants of Health:  Lives at home   Dispostion:  After consideration of the diagnostic results and the patients response to treatment, I feel that the patent would benefit from cardioversion.    CRITICAL CARE Performed by: Isla Pence   Total critical care time: 60 minutes  Critical care time was exclusive of separately billable procedures and treating other patients.  Critical care was necessary to treat or prevent imminent or life-threatening deterioration.  Critical care was time spent personally by me on the following activities: development of treatment plan with patient and/or surrogate as well as nursing, discussions with consultants, evaluation of patient's response to treatment, examination of patient, obtaining history from patient or surrogate, ordering and performing treatments and interventions, ordering and review of laboratory studies, ordering and review of radiographic studies, pulse oximetry and re-evaluation of patient's condition.         Final Clinical Impression(s) / ED Diagnoses Final diagnoses:  Acute renal failure, unspecified acute renal failure type ([place])  Atrial fibrillation with rapid ventricular response (HCC)  Acute respiratory failure with hypoxia (HCC)  Severe sepsis with septic shock [facility])    Rx / DC Orders ED Discharge Orders     None         Isla Pence, MD 08/11/2022 1606

## 2022-07-23 NOTE — Consult Note (Addendum)
NAME:  Latoya [name], MRN:  [id number], DOB:  July 16, 1946, LOS: 0 ADMISSION DATE:  08/21/2022, CONSULTATION DATE:  08/18/2022 REFERRING MD:  Dr. Gilford Raid, CHIEF COMPLAINT:  Hypoxemia   History of Present Illness:  Patient history provided by husband at bedside, and nursing staff given that patient is intubated.  Patient presented to ED with altered mental status, hypoxemic to 70% on room air.  Patient was placed on nonrebreather, and then subsequently intubated due to acute hypoxic respiratory failure.  Patient's last known well was Sunday.  Per husband, patient had preceding cough, and decreased energy.  She also reported increased pain in her feet, but at baseline is able to ambulate with a walker.  It is unclear if patient has been adherent to medications prior to presentation in emergency department.  Per husband at bedside, patient is a DNR; however, this is not reciprocated in patient's chart.  In ED:  CT of head demonstrated no acute intracranial abnormality.  CT angiography AO +BLFEM showed mild to moderate stenosis at the origin of the left posterior tibial artery without identified flow-limiting stenosis of any other lower extremity arteries.  There is three-vessel runoff to the ankles bilaterally.  No abnormality of the abdominal aorta and mesenteric arteries.  Bilateral lower lobe and right middle lobe patchy opacities were identified. Chest x-ray with multiple bilateral patchy airspace opacities, right greater than left, concerning for multifocal pneumonia.  Patient placed with left side down, with resulting improved oxygenation.  ABG progressively acidotic, with most recent at 7.065/40.7/72/11 0.5, as such bicarb GTT and 2 ampoules of sodium bicarbonate ordered.  Ceftriaxone and azithromycin administered prior to CCM consult.  Patient has right femoral vein central line, and 1 left UE peripheral IV for access.    ED labs:  - ABG: 7.065/40.7/72/11.5 - WBC at 19.8 - anion gap:  30 -Creatinine is 6.7 (baseline of 0.8), BUN of 64 -BNP of 312.3 -Sodium 129, potassium 4.9, glucose 64,  -High sensitive troponin 31 >37 -Lactate > 9  Pertinent  Medical History  Hemangioma of liver Hypothyroidism (Synthroid 100 mcg daily) Anemia of chronic disease Hyperlipidemia essential  Hypertension (Lopressor 100 mg twice daily, Cozaar 0.5 mg daily) CAD in native artery Asthma GERD Peripheral artery disease Cervical pseudoarthrosis History of enterococcal bacteremia History of E. coli bacteremia History of pulmonary embolism (Xarelto 20 mg daily) Paroxysmal atrial fibrillation Mitral vegetation History of DVT Diverticulosis AVM of colon without hemorrhage Chronic back pain   Significant Hospital Events: Including procedures, antibiotic start and stop dates in addition to other pertinent events   Patient presented to ED with acute hypoxic respiratory failure, subsequently intubated, on max FiO2.  PEEP and respiratory rate were increased to augment persistent hypoxemia.  Right femoral central venous catheter placed for medication access.  Interim History / Subjective:  CCM admitted patient to 57M  Objective   Blood pressure 106/82, pulse (!) 41, temperature (!) 97.5 F (36.4 C), resp. rate (!) 25, height '5\' 2"'$  (1.575 m), weight 74.8 kg, SpO2 (!) 76 %.    Vent Mode: PRVC FiO2 (%):  [100 %] 100 % Set Rate:  [18 bmp] 18 bmp Vt Set:  [400 mL] 400 mL PEEP:  [8 cmH20-10 cmH20] 10 cmH20 Plateau Pressure:  [17 cmH20-18 cmH20] 18 cmH20  No intake or output data in the 24 hours ending 08/04/2022 1138 Filed Weights   08/09/2022 0800  Weight: 74.8 kg    Examination: General: Critically ill, intubated HENT: ET tube fixed in position, atraumatic, dried gastric  remains in BL nares and around mouth. Abnormal pigmentation of left eye.  Lungs: Coarse breath sounds in apical lung fields bilaterally.  Lower lung fields with increased vesicular breath sounds present, R>L.   Cardiovascular: Irregularly irregular rhythm with rapid rate, no obvious murmurs/rubs/gallops. Abdomen: Large body habitus, soft, nontympanic, nondistended. Extremities: Diffuse signs of peripheral venous insufficiency of bilateral lower extremities.  Bilateral extremities are erythematous ( R>> L).  Left lower extremity is warm to touch, with 1 fluid-filled bulla appreciated on left medial lower extremity, concerning for signs of diffuse cellulitis.  Right lower extremity cool to touch.  1+ pitting edema to mid shin present bilaterally.  Bilateral toes (10/10) are purple, unable to appreciate any distal pedal pulses bilaterally.  Severe onychomycosis bilaterally. Neuro: Intubated, sedated.  Not responsive to pain.  GU: Foley in place, patent  Resolved Hospital Problem list     Assessment & Plan:  Acute hypoxic respiratory failure  Altered mental status Septic shock Right lobe pneumonia Lactic acidosis Patient presented with acute hypoxic respiratory failure and associated hypotension. Concern for septic shock secondary to identified right lung pneumonia with additional worry for adjunct ARDS.  -Stop ceftriaxone - Continue azithromycin  -Start Zosyn -Continue Levophed for pressor support -High vent needs, concern for ARDS physiology.  Continue 100% FiO2, PEEP 18, RR 35, tidal volume 320 at ARDS rate of 6 cc/kilogram -Start bicarb gtt.  and give 2 A of bicarb -Repeat ABG -Start propofol, continue fentanyl, RASS goal: -2 -Place arterial line for constant blood pressure monitoring -TTE to evaluate for structural cardiogenic component -Follow blood culture -Follow-up PCT -DuoNebs as needed given history of asthma  AKI Potentially ATN secondary to septic shock. Creatine at 6.7 from baseline of 0.8.  -Consult nephro, appreciate all recs -Monitor electrolytes, replete as necessary -Strict I's and O's -Avoid nephrotoxic medications  Paroxysmal atrial fibrillation Patient is currently  in A-fib with RVR. On Lopressor 100 mg twice daily -Monitor, no acute intervention at this time -Hold Lopressor 100 mg twice daily given hypotension  Hypertension Hold home regimen of Lopressor and Cozaar given hypotension  GERD -Protonix daily  Peripheral vascular disease -Bilateral DVT ultrasound to assess for clot burden  DVT prophylaxis Hx of PE Hx of Dvt Home regimen of xarelto '20mg'$  daily. Anaphylactic reaction to Heparin.  - Prophylaxis per pharmacy, restart xarelto  Diabetes, type II BGL at 66 upon presentation. -CBG, goal of 140-180 -SSI with NovoLog 0 to 9 units   Best Practice (right click and "Reselect all SmartList Selections" daily)   Diet/type: NPO DVT prophylaxis: other  GI prophylaxis: PPI Lines: Central line, Arterial Line, and yes and it is still needed Foley:  Yes, and it is still needed Code Status:  DNR Last date of multidisciplinary goals of care discussion [10/02 in ED with husband at bedside]  Labs   CBC: Recent Labs  Lab 07/25/2022 0807 08/08/2022 0814 08/08/2022 0828 08/10/2022 1021  WBC 19.8*  --   --   --   NEUTROABS 16.5*  --   --   --   HGB 13.9 13.3 13.6 10.9*  HCT 41.4 39.0 40.0 32.0*  MCV 86.4  --   --   --   PLT 365  --   --   --     Basic Metabolic Panel: Recent Labs  Lab 08/09/2022 0807 07/31/2022 0814 08/16/2022 0828 08/19/2022 1021  NA 135 126* 131* 129*  K 5.2* 5.1 5.0 4.9  CL 95*  --  102  --  CO2 10*  --   --   --   GLUCOSE 71  --  64*  --   BUN 96*  --  113*  --   CREATININE 6.44*  --  6.70*  --   CALCIUM 9.4  --   --   --    GFR: Estimated Creatinine Clearance: 6.9 mL/min (A) (by C-G formula based on SCr of 6.7 mg/dL (H)). Recent Labs  Lab 08/17/2022 0807 08/19/2022 1034  WBC 19.8*  --   LATICACIDVEN >9.0* 8.0*    Liver Function Tests: No results for input(s): "AST", "ALT", "ALKPHOS", "BILITOT", "PROT", "ALBUMIN" in the last 168 hours. No results for input(s): "LIPASE", "AMYLASE" in the last 168 hours. No results  for input(s): "AMMONIA" in the last 168 hours.  ABG    Component Value Date/Time   PHART 7.065 (LL) 08/18/2022 1021   PCO2ART 40.7 07/30/2022 1021   PO2ART 72 (L) 08/18/2022 1021   HCO3 11.5 (L) 08/14/2022 1021   TCO2 13 (L) 08/06/2022 1021   ACIDBASEDEF 18.0 (H) 08/06/2022 1021   O2SAT 85 08/07/2022 1021     Coagulation Profile: No results for input(s): "INR", "PROTIME" in the last 168 hours.  Cardiac Enzymes: No results for input(s): "CKTOTAL", "CKMB", "CKMBINDEX", "TROPONINI" in the last 168 hours.  HbA1C: Hemoglobin A1C  Date/Time Value Ref Range Status  12/21/2015 12:00 AM 5.8  Final  04/22/2013 12:00 AM 6.1  Final   Hgb A1c MFr Bld  Date/Time Value Ref Range Status  04/16/2022 02:39 PM 7.8 (H) 4.6 - 6.5 % Final    Comment:    Glycemic Control Guidelines for People with Diabetes:Non Diabetic:  <6%Goal of Therapy: <7%Additional Action Suggested:  >8%   10/11/2021 02:16 PM 7.2 (H) 4.6 - 6.5 % Final    Comment:    Glycemic Control Guidelines for People with Diabetes:Non Diabetic:  <6%Goal of Therapy: <7%Additional Action Suggested:  >8%     CBG: No results for input(s): "GLUCAP" in the last 168 hours.  Review of Systems:    Past Medical History:  She,  has a past medical history of Allergy, Anemia, iron deficiency, Antiphospholipid syndrome ([place]), Asthma, Atrial fibrillation ([place]), Bladder troubles, CAD (coronary artery disease), Cancer ([place]), Carotid arterial disease ([place]), CHF (congestive heart failure) ([place]), Clotting disorder ([place]), Complication of anesthesia, Diabetes ([place]), Drug allergy, DVT (deep venous thrombosis) ([place]), Erosive gastritis (1994), Family history of adverse reaction to anesthesia, GERD (gastroesophageal reflux disease), H/O hiatal hernia, Heart murmur, History of colon polyps, HLD (hyperlipidemia), HTN (hypertension), Hypothyroidism, Liver spot, Moderate mitral regurgitation, PAF (paroxysmal atrial fibrillation) ([place]), Pulmonary embolism ([place]), PVC's  (premature ventricular contractions), Septic shock ([place]), Spondylosis, lumbosacral, and Urinary incontinence.   Surgical History:   Past Surgical History:  Procedure Laterality Date   ABDOMINAL HYSTERECTOMY     partial abdominal -1998   BACK SURGERY     x13 cervical and lumbar thoracic spine surgery   BIOPSY  01/26/2021   Procedure: BIOPSY;  Surgeon: Lavena Bullion, DO;  Location: [facility];  Service: Gastroenterology;;   BREAST ENHANCEMENT SURGERY     BREAST SURGERY     all benign cysts x3    CARDIAC CATHETERIZATION     2005   CHOLECYSTECTOMY N/A 10/04/2016   Procedure: LAPAROSCOPIC CHOLECYSTECTOMY;  Surgeon: Erroll Luna, MD;  Location: [place];  Service: General;  Laterality: N/A;   cleft lip and palate repair     76 yo    COLONOSCOPY  2012   COLONOSCOPY WITH PROPOFOL N/A 01/26/2021  Procedure: COLONOSCOPY WITH PROPOFOL;  Surgeon: Lavena Bullion, DO;  Location: [facility];  Service: Gastroenterology;  Laterality: N/A;   ESOPHAGOGASTRODUODENOSCOPY (EGD) WITH PROPOFOL N/A 01/26/2021   Procedure: ESOPHAGOGASTRODUODENOSCOPY (EGD) WITH PROPOFOL;  Surgeon: Lavena Bullion, DO;  Location: [facility];  Service: Gastroenterology;  Laterality: N/A;   hysterectomy     IR GENERIC HISTORICAL  06/04/2016   IR PERC CHOLECYSTOSTOMY 06/04/2016 Greggory Keen, MD [facility]   IR GENERIC HISTORICAL  08/06/2016   IR CHOLANGIOGRAM EXISTING TUBE 08/06/2016 Aletta Edouard, MD [facility]   IR GENERIC HISTORICAL  07/17/2016   IR RADIOLOGIST EVAL & MGMT 07/17/2016 [facility]   JOINT REPLACEMENT     MOHS SURGERY  05/22/2020   right side forehead   POLYPECTOMY  01/26/2021   Procedure: POLYPECTOMY;  Surgeon: Lavena Bullion, DO;  Location: [facility];  Service: Gastroenterology;;   POSTERIOR CERVICAL FUSION/FORAMINOTOMY Right 08/31/2014   Procedure: Right cervical six-seven foraminotomy, Cervical five-Thoracic one fusion, Cervical six-seven lateral mass screws;  Surgeon: Erline Levine, MD;  Location: [place] [facility];  Service: Neurosurgery;  Laterality: Right;   spina bifida repair     76 yo    TONSILLECTOMY     TOTAL KNEE ARTHROPLASTY     left     Social History:   reports that she has never smoked. She has never used smokeless tobacco. She reports that she does not drink alcohol and does not use drugs.   Family History:  Her family history includes Colon cancer in her cousin, paternal aunt, and sister; Heart disease in her father and sister. There is no history of Colon polyps, Esophageal cancer, Rectal cancer, or Stomach cancer.   Allergies Allergies  Allergen Reactions   Chlorhexidine Anaphylaxis and Itching    Sensitive to dye in Betadine & Chlorohexadine    Enoxaparin Sodium Anaphylaxis   Heparin Anaphylaxis   Hornet Venom Anaphylaxis    European Hornets   Pork-Derived Products Anaphylaxis   Povidone-Iodine Itching and Rash    NOTE: Patient has had anaphylactic reaction to Betadine due to dyes in product. Sensitivity-" but if its wiped off she is able to tolerate betadine"    Indomethacin Rash   Lactose Intolerance (Gi) Diarrhea and Nausea And Vomiting    Can tolerate most [place] cheeses (Takes Lactaid)   Phenylpropanolamine Hypertension     Home Medications  Prior to Admission medications   Medication Sig Start Date End Date Taking? Authorizing Provider  Azelastine HCl 137 MCG/SPRAY SOLN USE 1 TO 2 SPRAYS IN EACH NOSTRIL TWICE A DAY. Patient taking differently: Place 1-2 sprays into both nostrils 2 (two) times daily as needed (allergies). 02/28/22   Copland, Gay Filler, MD  Calcium Carb-Cholecalciferol 500-400 MG-UNIT TABS Take 1 tablet by mouth 2 (two) times daily.    [provider]  CLARITIN-D 12 HOUR 5-120 MG tablet TAKE 1 TABLET EVERY TWELVE HOURS AS NEEDED. Patient taking differently: Take 1 tablet by mouth 2 (two) times daily as needed for allergies. 09/01/21   Copland, Gay Filler, MD  cyclobenzaprine (FLEXERIL) 5 MG tablet Take 5 mg  by mouth 3 (three) times daily as needed for muscle spasms. 12/13/20   [provider]  docusate sodium (COLACE) 100 MG capsule Take 200 mg by mouth 2 (two) times daily.    [provider]  EPINEPHrine 0.3 mg/0.3 mL IJ SOAJ injection INJECT INTO THE MIDDLE OF THE OUTER THIGH AND HOLD FOR 3 SECONDS AS NEEDED FOR SEVERE ALLERGIC REACTION THEN CALL 911  IF USED. Patient taking differently: Inject 0.3 mg into the muscle as needed for anaphylaxis. 02/27/22   Copland, Gay Filler, MD  esomeprazole (NEXIUM) 40 MG capsule Take 1 capsule (40 mg total) by mouth 2 (two) times daily before a meal. 01/29/22 01/29/23  Cirigliano, Vito V, DO  FLOVENT HFA 110 MCG/ACT inhaler USE 1-2 PUFFS TWICE DAILY. Patient taking differently: Inhale 1-2 puffs into the lungs 2 (two) times daily as needed (wheezing, shortness of breath). 03/30/22   Copland, Gay Filler, MD  furosemide (LASIX) 40 MG tablet TAKE 1 TABLET ONCE DAILY. 01/05/22   Burnell Blanks, MD  gabapentin (NEURONTIN) 600 MG tablet Take 600-750 mg by mouth at bedtime.  08/03/16   [provider]  Glucosamine-Chondroitin 750-600 MG TABS Take 2 tablets by mouth 2 (two) times daily.     [provider]  HYDROmorphone (DILAUDID) 4 MG tablet Take 4 mg by mouth every 4 (four) hours as needed (pain). 02/20/18   [provider]  levothyroxine (SYNTHROID) 100 MCG tablet Take 1 tablet (100 mcg total) by mouth daily before breakfast. 03/30/22   Copland, Gay Filler, MD  losartan (COZAAR) 25 MG tablet Take 0.5 tablets (12.5 mg total) by mouth daily. 10/25/21   Copland, Gay Filler, MD  Melatonin 5 MG TABS Take 2.5-5 mg by mouth at bedtime as needed.    [provider]  metFORMIN (GLUCOPHAGE-XR) 500 MG 24 hr tablet Take 2 tablets (1,000 mg total) by mouth 2 (two) times daily. 05/07/22   Copland, Gay Filler, MD  metoprolol tartrate (LOPRESSOR) 100 MG tablet TAKE 1 TABLET BY MOUTH TWICE DAILY. Patient taking differently: Take 100 mg by mouth 2  (two) times daily. 12/15/21   Burnell Blanks, MD  metroNIDAZOLE (METROGEL) 1 % gel Apply topically daily. Use as needed for rash on your chest 04/16/22   Copland, Gay Filler, MD  montelukast (SINGULAIR) 10 MG tablet TAKE ONE TABLET BY MOUTH AT BEDTIME. Patient taking differently: Take 10 mg by mouth at bedtime. 03/23/22   Copland, Gay Filler, MD  Multiple Vitamin (MULTIVITAMIN WITH MINERALS) TABS Take 1 tablet by mouth daily.    [provider]  nitroGLYCERIN (NITROSTAT) 0.4 MG SL tablet DISSOLVE 1 TABLET UNDER TONGUE AS NEEDED FOR CHEST PAIN,MAY REPEAT IN 5 MINUTES FOR 2 DOSES. Patient taking differently: Place 0.4 mg under the tongue every 5 (five) minutes as needed for chest pain. 02/23/22   Burnell Blanks, MD  nystatin (MYCOSTATIN/NYSTOP) powder Apply 1 Application topically 3 (three) times daily as needed for irritation (rash). 06/29/22   [provider]  nystatin cream (MYCOSTATIN) Apply 1 Application topically 2 (two) times daily. Use as needed for rash 06/05/22   Copland, Gay Filler, MD  potassium chloride (KLOR-CON) 10 MEQ tablet TAKE 4 TABLETS TWICE DAILY. Patient taking differently: Take 40 mEq by mouth 2 (two) times daily. 10/13/21   Burnell Blanks, MD  rosuvastatin (CRESTOR) 20 MG tablet TAKE ONE TABLET ONCE DAILY Patient taking differently: Take 20 mg by mouth daily. 03/16/22   Burnell Blanks, MD  terbinafine (LAMISIL) 250 MG tablet Take 1 tablet (250 mg total) by mouth daily. 06/05/22   Copland, Gay Filler, MD  XARELTO 20 MG TABS tablet Take 1 tablet (20 mg total) by mouth daily with supper. 04/27/22   Burnell Blanks, MD

## 2022-07-23 NOTE — Consult Note (Signed)
Reason for Consult: Acute kidney injury Referring Physician: Kipp Brood MD (CCM)  HPI:  76 year old woman with past medical history significant for coronary artery disease, carotid arterial disease, atrial fibrillation on anticoagulation with Xarelto, hypertension, dyslipidemia, type 2 diabetes mellitus, history of DVT, hypothyroidism and normal renal function at baseline (creatinine around 0.8).  Brought to the emergency room earlier this morning with altered mental status and hypoxic respiratory failure with intubation in the emergency room.  She apparently had a few days history of preceding cough and decreasing energy levels as well as increasing pain in her lower extremities.  While in the emergency room, noted to have cool cyanotic appearing lower extremities for which she had CT angiography (100 mL iodinated contrast) of the aorta/lower extremities that showed mild to moderate stenosis at the origin of the left posterior tibial artery without identified flow-limiting stenosis of any other vessels.  Her husband at bedside does not recall of the patient voicing any urinary complaints prior to admission.  Medications reviewed notable for use of losartan 12.5 mg daily, metformin 1000 mg twice daily and furosemide 40 mg daily prior to admission.  Concern raised by labs in the emergency room that showed sodium 135, potassium 5.2, bicarbonate 10, BUN 96 with creatinine of 6.4 (i-STAT showing higher BUN of 113, creatinine 6.7).  She has had intravenous fluid boluses with minimal urine output.  Past Medical History:  Diagnosis Date   Allergy    seasonal   Anemia, iron deficiency    Antiphospholipid syndrome ([place])    with hypercoagulable state   Asthma    extrinsic; moderate, persistant, nml spirometry 2010, nml CXR 1/08   Atrial fibrillation (HCC)    Bladder troubles    REPORTS INFECTIONS ON OCCASION DUE TO URETHRA MEATUS STRICTURE AT BIRTH    CAD (coronary artery disease)    a. 50% mid LAD,  80% ostial D1 and moderate 80% mid circ by cath 2003. b. nonischemic nuc in 2013.   Cancer ([place])    basal cell removed fr. L arm    Carotid arterial disease ([place])    a. 03/2017 showed 1-39% bilateral internal carotid stenosis (high end range on left), >50% LECA stenosis, with recommendation for f/u duplex 03/2018.   CHF (congestive heart failure) (HCC)    Clotting disorder (HCC)    Complication of anesthesia    cardiac arrest- in OR, at age 89 (67)y.o. during Scalenotomy in her early 20's   Diabetes [facility])    Drug allergy    heparin/lovenox   DVT (deep venous thrombosis) (HCC)    Erosive gastritis 1994   Family history of adverse reaction to anesthesia    some family members have had trouble waking up   GERD (gastroesophageal reflux disease)    H/O hiatal hernia    Heart murmur    atrial fib, controlled   History of colon polyps    HLD (hyperlipidemia)    HTN (hypertension)    Pt states her high blood pressure readings are due tot he method of measurementsMcAlhaney at [employer], manages pt.  LOV 03/2014   Hypothyroidism    Liver spot    cyst- - no biopsy, but told that its benign    Moderate mitral regurgitation    PAF (paroxysmal atrial fibrillation) ([place])    Pulmonary embolism ([place])    related to back surgery with prior coumadin use, now off   PVC's (premature ventricular contractions)    a. noted in 09/2017.   Septic shock ([place])  Spondylosis, lumbosacral    ARTHRITIS- OA    Urinary incontinence     Past Surgical History:  Procedure Laterality Date   ABDOMINAL HYSTERECTOMY     partial abdominal -1998   BACK SURGERY     x13 cervical and lumbar thoracic spine surgery   BIOPSY  01/26/2021   Procedure: BIOPSY;  Surgeon: Lavena Bullion, DO;  Location: [facility];  Service: Gastroenterology;;   BREAST ENHANCEMENT SURGERY     BREAST SURGERY     all benign cysts x3    CARDIAC CATHETERIZATION     2005   CHOLECYSTECTOMY N/A 10/04/2016   Procedure: LAPAROSCOPIC  CHOLECYSTECTOMY;  Surgeon: Erroll Luna, MD;  Location: [facility];  Service: General;  Laterality: N/A;   cleft lip and palate repair     76 yo    COLONOSCOPY  2012   COLONOSCOPY WITH PROPOFOL N/A 01/26/2021   Procedure: COLONOSCOPY WITH PROPOFOL;  Surgeon: Lavena Bullion, DO;  Location: [facility];  Service: Gastroenterology;  Laterality: N/A;   ESOPHAGOGASTRODUODENOSCOPY (EGD) WITH PROPOFOL N/A 01/26/2021   Procedure: ESOPHAGOGASTRODUODENOSCOPY (EGD) WITH PROPOFOL;  Surgeon: Lavena Bullion, DO;  Location: [facility];  Service: Gastroenterology;  Laterality: N/A;   hysterectomy     IR GENERIC HISTORICAL  06/04/2016   IR PERC CHOLECYSTOSTOMY 06/04/2016 Greggory Keen, MD [facility]   IR GENERIC HISTORICAL  08/06/2016   IR CHOLANGIOGRAM EXISTING TUBE 08/06/2016 Aletta Edouard, MD [facility]   IR GENERIC HISTORICAL  07/17/2016   IR RADIOLOGIST EVAL & MGMT 07/17/2016 [facility]   JOINT REPLACEMENT     MOHS SURGERY  05/22/2020   right side forehead   POLYPECTOMY  01/26/2021   Procedure: POLYPECTOMY;  Surgeon: Lavena Bullion, DO;  Location: [facility];  Service: Gastroenterology;;   POSTERIOR CERVICAL FUSION/FORAMINOTOMY Right 08/31/2014   Procedure: Right cervical six-seven foraminotomy, Cervical five-Thoracic one fusion, Cervical six-seven lateral mass screws;  Surgeon: Erline Levine, MD;  Location: [place] [facility];  Service: Neurosurgery;  Laterality: Right;   spina bifida repair     76 yo    TONSILLECTOMY     TOTAL KNEE ARTHROPLASTY     left    Family History  Problem Relation Age of Onset   Heart disease Father        MI    Heart disease Sister    Colon cancer Sister    Colon cancer Cousin        a guy-on father side    Colon cancer Paternal Aunt        great aunt    Colon polyps Neg Hx    Esophageal cancer Neg Hx    Rectal cancer Neg Hx    Stomach cancer Neg Hx     Social History:  reports that she has never smoked. She has never used smokeless tobacco. She  reports that she does not drink alcohol and does not use drugs.  Allergies:  Allergies  Allergen Reactions   Chlorhexidine Anaphylaxis, Itching and Other (See Comments)    Sensitive to dye in Betadine & Chlorohexadine    Enoxaparin Sodium Anaphylaxis   Heparin Anaphylaxis   Hornet Venom Anaphylaxis and Other (See Comments)    European Hornets   Pork-Derived Products Anaphylaxis   Povidone-Iodine Itching, Rash and Other (See Comments)    NOTE: Patient has had anaphylactic reaction to Betadine due to dyes in product. Sensitivity-" but if its wiped off she is able to tolerate betadine"    Indomethacin Rash   Lactose  Intolerance (Gi) Diarrhea, Nausea And Vomiting and Other (See Comments)    Can tolerate most [place] cheeses (Takes Lactaid)   Phenylpropanolamine Hypertension    Medications: Scheduled:  artificial tears   Both Eyes Q8H   fentaNYL (SUBLIMAZE) injection  25 mcg Intravenous Once   insulin aspart  0-9 Units Subcutaneous Q4H   ipratropium-albuterol  3 mL Nebulization Q6H   pantoprazole (PROTONIX) IV  40 mg Intravenous Q24H   sodium bicarbonate       Continuous:  sodium chloride 250 mL (08/20/2022 1438)   azithromycin Stopped (08/02/2022 1056)   fentaNYL infusion INTRAVENOUS 200 mcg/hr (07/22/2022 0934)   norepinephrine (LEVOPHED) Adult infusion 6 mcg/min (08/14/2022 1148)   piperacillin-tazobactam (ZOSYN)  IV 2.25 g (07/31/2022 1440)   propofol (DIPRIVAN) infusion Stopped (07/27/2022 1019)   sodium bicarbonate 150 mEq in dextrose 5 % 1,150 mL infusion 125 mL/hr at 07/25/2022 1351   ZLD:JTTSVXBL, etomidate, fentaNYL, ipratropium-albuterol, polyethylene glycol, sodium bicarbonate, succinylcholine     Latest Ref Rng & Units 08/10/2022   10:21 AM 07/25/2022    8:28 AM 08/16/2022    8:14 AM  BMP  Glucose 70 - 99 mg/dL  64    BUN 8 - 23 mg/dL  113    Creatinine 0.44 - 1.00 mg/dL  6.70    Sodium 135 - 145 mmol/L 129  131  126   Potassium 3.5 - 5.1 mmol/L 4.9  5.0  5.1   Chloride 98 -  111 mmol/L  102         Latest Ref Rng & Units 07/27/2022   10:21 AM 08/16/2022    8:28 AM 07/31/2022    8:14 AM  CBC  Hemoglobin 12.0 - 15.0 g/dL 10.9  13.6  13.3   Hematocrit 36.0 - 46.0 % 32.0  40.0  39.0      CT Angio Aortobifemoral W and/or Wo Contrast  Result Date: 08/21/2022 CLINICAL DATA:  Altered mental status Vomiting Nonpalpable lower extremity pulses Discolored lower extremities below the knee EXAM: CT ANGIOGRAPHY OF ABDOMINAL AORTA WITH ILIOFEMORAL RUNOFF TECHNIQUE: Multidetector CT imaging of the abdomen, pelvis and lower extremities was performed using the standard protocol during bolus administration of intravenous contrast. Multiplanar CT image reconstructions and MIPs were obtained to evaluate the vascular anatomy. RADIATION DOSE REDUCTION: This exam was performed according to the departmental dose-optimization program which includes automated exposure control, adjustment of the mA and/or kV according to patient size and/or use of iterative reconstruction technique. CONTRAST:  100 mL OMNIPAQUE IOHEXOL 350 MG/ML SOLN COMPARISON:  None available FINDINGS: VASCULAR Aorta: Normal caliber aorta without aneurysm, dissection, vasculitis or significant stenosis. Celiac: Patent without evidence of aneurysm, dissection, vasculitis or significant stenosis. SMA: Patent without evidence of aneurysm, dissection, vasculitis or significant stenosis. Renals: Both renal arteries are patent without evidence of aneurysm, dissection, vasculitis, fibromuscular dysplasia or significant stenosis. IMA: Patent without evidence of aneurysm, dissection, vasculitis or significant stenosis. RIGHT Lower Extremity Inflow: Mild diffuse calcified plaque of the common iliac artery. Evaluation for stenosis is limited due to streak artifact from spine hardware. Evaluation of the origin of the internal iliac artery is limited due to streak artifact from spine hardware. The internal iliac artery branches are patent.  Evaluation of the origin of the external iliac artery is limited due to streak artifact. It is otherwise patent. Outflow: Mild calcified plaque in the common femoral artery without flow-limiting stenosis. Profunda femoris artery is patent without significant stenosis. Minimal focal calcified plaque of the distal superficial femoral artery without flow-limiting  stenosis. Popliteal artery is diminutive but patent without significant stenosis. Runoff: Patent three vessel runoff to the ankle. LEFT Lower Extremity Inflow: Common, internal and external iliac arteries are patent without evidence of aneurysm, dissection, vasculitis or significant stenosis. Outflow: No significant stenosis of the common femoral artery. The profundus femoris artery is patent. There is mild stenosis at the origin due to focal calcified plaque. The superficial femoral artery is patent no significant stenosis. Evaluation of the left popliteal artery is significantly limited due to streak artifact from left total knee prosthesis. The distal popliteal artery is patent. Runoff: Tibioperoneal trunk is mildly narrowed due to calcified plaque. There is mild to moderate stenosis at the origin of the posterior tibial artery due to calcified plaque. There is three-vessel runoff to the left ankle. Veins: Right common femoral vein approach central venous catheter terminates in the common iliac vein. Review of the MIP images confirms the above findings. NON-VASCULAR Lower chest: Airspace opacity within the lower lobes bilaterally may be due to atelectasis or pneumonia. Bronchiectasis also noted in the right middle lobe with adjacent airspace opacity. Hepatobiliary: Visualized portions the liver are unremarkable. Postsurgical changes of cholecystectomy are seen. No bile duct dilatation. Pancreas: Unremarkable. No pancreatic ductal dilatation or surrounding inflammatory changes. Spleen: Normal in size without focal abnormality. Adrenals/Urinary Tract: Adrenal  glands are unremarkable, evaluation of the kidneys, particularly the left side is limited due to streak artifact as well as motion. No hydronephrosis or hydroureter. The bladder is collapsed around a Foley catheter balloon which limits evaluation. Stomach/Bowel: Small hiatal hernia. Small duodenal diverticulum. No bowel dilatation to indicate ileus or obstruction. Appendix is not definitively identified, however no right lower quadrant phlegm a tori changes are present. Evaluation of the bowel is limited due to respiratory motion. Scattered colonic diverticula are present without evidence of acute diverticulitis. Lymphatic: No enlarged abdominal or pelvic lymph nodes. Reproductive: Uterus is either atrophic or absent. It is not well visualized. No adnexal abnormalities. Other: Bilateral breast prostheses are partially visualized. Single surgical clip noted in the left rectus sheath. Musculoskeletal: Long segment spinal fusion changes again seen. Left total knee prosthesis is seen. No acute osseous abnormality. Limitations: Evaluation limited due to respiratory motion and streak artifact. IMPRESSION: 1. Mild to moderate stenosis at the origin of the left posterior tibial artery. Otherwise, no flow-limiting stenosis of the lower extremity arteries. Three-vessel runoff to the ankles bilaterally. 2. No significant abnormality of the abdominal aorta or mesenteric arteries. NON-VASCULAR 1. Bilateral lower lobe and right middle lobe airspace opacities, suspicious for multifocal pneumonia. 2. Colonic diverticulosis. Electronically Signed   By: Miachel Roux M.D.   On: 08/12/2022 10:41   CT Head Wo Contrast  Result Date: 08/18/2022 CLINICAL DATA:  Altered mental status.  Patient found on floor. EXAM: CT HEAD WITHOUT CONTRAST TECHNIQUE: Contiguous axial images were obtained from the base of the skull through the vertex without intravenous contrast. RADIATION DOSE REDUCTION: This exam was performed according to the  departmental dose-optimization program which includes automated exposure control, adjustment of the mA and/or kV according to patient size and/or use of iterative reconstruction technique. COMPARISON:  CT head 09/16/2009 FINDINGS: Of note, streak artifact from dental hardware obscures posterior fossa. Brain: No evidence of acute infarction, hemorrhage, hydrocephalus, extra-axial collection or mass lesion/mass effect. Generalized parenchymal volume loss, within normal limits for patient age. Scattered hypodensities in the periventricular and subcortical white matter, consistent with chronic small-vessel ischemic changes. Vascular: Vascular calcifications at the skull base no hyperdense vessel or unexpected  calcification. Skull: Hyperostosis frontalis interna. Negative for fracture or focal lesion. Sinuses/Orbits: No acute finding. Trace mucosal thickening in the right maxillary sinus. The other visualized paranasal sinuses and mastoid air cells are clear. Hypoplastic right frontal sinus. Other: None. IMPRESSION: No acute intracranial abnormality. Generalized parenchymal volume loss and chronic small-vessel ischemic changes. Electronically Signed   By: Ileana Roup M.D.   On: 08/20/2022 10:14   DG Chest Port 1 View  Result Date: 08/12/2022 CLINICAL DATA:  Shortness of breath.  Respiratory distress. EXAM: PORTABLE CHEST 1 VIEW COMPARISON:  Chest radiograph 12/03/2018 and earlier FINDINGS: Of note, the patient is mildly rotated and tilted on this portable examination. Patchy airspace opacities seen throughout the right lung as well as in the left lung base. No pneumothorax or large volume pleural effusion. Stable elevation of the right hemidiaphragm. Heart size is difficult to assess due to adjacent consolidations but appears stable. Partially visualized anterior cervical spinal fixation and posterior bilateral pedicle screw and rod fixation of the distal thoracic and lumbar spine. Surgical clips in the right upper  abdomen. IMPRESSION: Multiple bilateral patchy airspace opacities, right-greater-than-left, concerning for multifocal pneumonia. Electronically Signed   By: Ileana Roup M.D.   On: 08/03/2022 08:23    Review of Systems  Unable to perform ROS: Intubated   Blood pressure 101/73, pulse (!) 142, temperature (!) 97.5 F (36.4 C), resp. rate 19, height '5\' 2"'$  (1.575 m), weight 89 kg, SpO2 96 %. Physical Exam Vitals reviewed.  Constitutional:      Appearance: She is well-developed. She is obese. She is ill-appearing and toxic-appearing.  HENT:     Head: Normocephalic.     Comments: Dried blood noted around right nostril    Mouth/Throat:     Comments: Intubated Neck:     Comments: Intubated Cardiovascular:     Rate and Rhythm: Tachycardia present. Rhythm irregular.     Comments: No palpable pulses over lower extremities Pulmonary:     Breath sounds: Examination of the right-lower field reveals decreased breath sounds. Examination of the left-lower field reveals decreased breath sounds. Decreased breath sounds present.  Abdominal:     General: Bowel sounds are normal.     Palpations: Abdomen is soft. There is no mass.  Musculoskeletal:     Right lower leg: Edema present.     Left lower leg: Edema present.     Comments: Trace to 1+ bilateral lower extremity edema.  Purplish discoloration right lower extremity with less purple left lower extremity.  Notable erythema over left leg that is slightly warmer to touch than the right leg.    Assessment/Plan: 1.  Acute kidney injury: Possibly hemodynamically mediated acute kidney injury in the setting of volume contraction with ongoing ARB/diuretic use; timeline suggest that she probably has evolved into ATN.  Anticipate some additional injury from iodinated intravenous contrast.  With poor urine output in response to fluid bolus and high likelihood of requiring renal replacement therapy in the next 24 hours if azotemia/acidemia continues to worsen and  she remains anuric/oliguric.  It would be important to reassess her CODE STATUS/goals of care with her husband given previous documentation of desiring DNR status on the admitting service note.  She appears to be mildly hypervolemic and without any compelling indication for dialysis at this time. Avoid nephrotoxic medications including NSAIDs and iodinated intravenous contrast exposure unless the latter is absolutely indicated.  Preferred narcotic agents for pain control are hydromorphone, fentanyl, and methadone. Morphine should not be used. Avoid Baclofen and  avoid oral sodium phosphate and magnesium citrate based laxatives / bowel preps. Continue strict Input and Output monitoring. Will monitor the patient closely with you and intervene or adjust therapy as indicated by changes in clinical status/labs. 2.  Septic shock: Suspected to be secondary to right lower lobe pneumonia (possible aspiration event versus de novo community-acquired pneumonia).  On broad-spectrum antimicrobial coverage and pressor support. 3.  Altered mental status: Secondary to septic shock, monitor with management of underlying process. 4.  Acute hypoxic respiratory failure: Intubated and currently ongoing ventilator support/adjustments per CCM and respiratory service. 5.  Peripheral vascular disease: Significant duskiness/impaired perfusion of lower extremity noted on physical exam with focal finding on CT arteriogram.  Additional management/work-up per primary service.  Would avoid Xarelto in the setting of acute kidney injury and consider alternative anticoagulation.   [place]ilayo Hagans K. 08/09/2022, 3:23 PM

## 2022-07-23 NOTE — Progress Notes (Signed)
[place] for IV argatroban Indication: atrial fibrillation, hx DVT, antiphospholipid syndrome  Allergies  Allergen Reactions   Chlorhexidine Anaphylaxis, Itching and Other (See Comments)    Sensitive to dye in Betadine & Chlorohexadine    Enoxaparin Sodium Anaphylaxis   Heparin Anaphylaxis   Hornet Venom Anaphylaxis and Other (See Comments)    European Hornets   Pork-Derived Products Anaphylaxis   Povidone-Iodine Itching, Rash and Other (See Comments)    NOTE: Patient has had anaphylactic reaction to Betadine due to dyes in product. Sensitivity-" but if its wiped off she is able to tolerate betadine"    Indomethacin Rash   Lactose Intolerance (Gi) Diarrhea, Nausea And Vomiting and Other (See Comments)    Can tolerate most [place] cheeses (Takes Lactaid)   Phenylpropanolamine Hypertension    Patient Measurements: Height: '5\' 2"'$  (157.5 cm) Weight: 89 kg (196 lb 3.4 oz) IBW/kg (Calculated) : 50.1 Heparin Dosing Weight: n/a  Vital Signs: Temp: 97.2 F (36.2 C) (10/02 1715) Temp Source: Core (10/02 1046) BP: 80/47 (10/02 1715) Pulse Rate: 135 (10/02 1715)  Labs: Recent Labs    07/29/2022 0807 07/27/2022 0814 08/10/2022 0828 08/20/2022 1021 07/30/2022 1034 07/27/2022 1406 08/08/2022 1559  HGB 13.9   < > 13.6 10.9*  --   --  11.6*  HCT 41.4   < > 40.0 32.0*  --   --  34.0*  PLT 365  --   --   --   --   --   --   CREATININE 6.44*  --  6.70*  --   --  5.80*  --   TROPONINIHS 31*  --   --   --  37*  --   --    < > = values in this interval not displayed.    Estimated Creatinine Clearance: 8.7 mL/min (A) (by C-G formula based on SCr of 5.8 mg/dL (H)).   Medical History: Past Medical History:  Diagnosis Date   Allergy    seasonal   Anemia, iron deficiency    Antiphospholipid syndrome ([place])    with hypercoagulable state   Asthma    extrinsic; moderate, persistant, nml spirometry 2010, nml CXR 1/08   Atrial fibrillation (HCC)    Bladder  troubles    REPORTS INFECTIONS ON OCCASION DUE TO URETHRA MEATUS STRICTURE AT BIRTH    CAD (coronary artery disease)    a. 50% mid LAD, 80% ostial D1 and moderate 80% mid circ by cath 2003. b. nonischemic nuc in 2013.   Cancer ([place])    basal cell removed fr. L arm    Carotid arterial disease ([place])    a. 03/2017 showed 1-39% bilateral internal carotid stenosis (high end range on left), >50% LECA stenosis, with recommendation for f/u duplex 03/2018.   CHF (congestive heart failure) (HCC)    Clotting disorder (HCC)    Complication of anesthesia    cardiac arrest- in OR, at age 76 (69)y.o. during Scalenotomy in her early 20's   Diabetes [facility])    Drug allergy    heparin/lovenox   DVT (deep venous thrombosis) (HCC)    Erosive gastritis 1994   Family history of adverse reaction to anesthesia    some family members have had trouble waking up   GERD (gastroesophageal reflux disease)    H/O hiatal hernia    Heart murmur    atrial fib, controlled   History of colon polyps    HLD (hyperlipidemia)    HTN (hypertension)  Pt states her high blood pressure readings are due tot he method of measurementsMcAlhaney at [employer], manages pt.  LOV 03/2014   Hypothyroidism    Liver spot    cyst- - no biopsy, but told that its benign    Moderate mitral regurgitation    PAF (paroxysmal atrial fibrillation) ([place])    Pulmonary embolism ([place])    related to back surgery with prior coumadin use, now off   PVC's (premature ventricular contractions)    a. noted in 09/2017.   Septic shock (HCC)    Spondylosis, lumbosacral    ARTHRITIS- OA    Urinary incontinence     Medications:  Infusions:   sodium chloride 250 mL (08/15/2022 1438)   argatroban     azithromycin Stopped (08/15/2022 1056)   fentaNYL infusion INTRAVENOUS 200 mcg/hr (08/14/2022 0934)   lactated ringers     norepinephrine (LEVOPHED) Adult infusion 6 mcg/min (08/18/2022 1148)   piperacillin-tazobactam (ZOSYN)  IV 2.25 g (07/31/2022 1440)    propofol (DIPRIVAN) infusion 5 mcg/kg/min (08/12/2022 1623)   sodium bicarbonate 150 mEq in dextrose 5 % 1,150 mL infusion 200 mL/hr at 07/22/2022 1625    Assessment: 76 yo female on chronic Xarelto for afib, hx DVT.  Pharmacy asked to begin anticoagulation with argatroban while critically ill.  Avoiding heparin and lovenox since they are pork-derived products and patient reports a history of anaphylaxis to pork.  Last Xarelto dose reported to be 9/30.  Also with AKI, Scr up 5.8.  No known history of bleeding.  Goal of Therapy:  aPTT 50-90 seconds Monitor platelets by anticoagulation protocol: Yes   Plan:  Start argatroban at 0.5 mcg/kg/min. Check aPTT 4 hrs after gtt starts. Daily aPTT and CBC while on argatroban.  [place] Crane, Roylene Reason, BCCP Clinical Pharmacist  07/28/2022 5:37 PM   [facility] pharmacy phone numbers are listed on [email or web address]

## 2022-07-23 NOTE — Progress Notes (Signed)
RLE pedal pulses could not be heard with doppler. RLE popliteal pulse could faintly be heard via doppler. Toes are dark purple and cold at this time. MD aware.

## 2022-07-23 NOTE — Progress Notes (Signed)
Critical ABG results given to Dr Tacy Learn at 1603 pH 7.148 pCO2-43 [id number] [id number]

## 2022-07-23 NOTE — ED Provider Notes (Signed)
Despite the Cr being elevated, pt has no pulses in her legs and she needs IV contrast.   Isla Pence, MD 08/03/2022 [phone]

## 2022-07-23 NOTE — Progress Notes (Signed)
[place] for IV argatroban Indication: atrial fibrillation, hx DVT, antiphospholipid syndrome  Allergies  Allergen Reactions   Chlorhexidine Anaphylaxis, Itching and Other (See Comments)    Sensitive to dye in Betadine & Chlorohexadine    Enoxaparin Sodium Other (See Comments)    Hx anaphylaxis to pork   Heparin Other (See Comments)    Hx anaphylaxis to pork   Hornet Venom Anaphylaxis and Other (See Comments)    European Hornets   Pork-Derived Products Anaphylaxis   Povidone-Iodine Itching, Rash and Other (See Comments)    NOTE: Patient has had anaphylactic reaction to Betadine due to dyes in product. Sensitivity-" but if its wiped off she is able to tolerate betadine"    Indomethacin Rash   Lactose Intolerance (Gi) Diarrhea, Nausea And Vomiting and Other (See Comments)    Can tolerate most [place] cheeses (Takes Lactaid)   Phenylpropanolamine Hypertension    Patient Measurements: Height: '5\' 2"'$  (157.5 cm) Weight: 89 kg (196 lb 3.4 oz) IBW/kg (Calculated) : 50.1 Heparin Dosing Weight: n/a  Vital Signs: Temp: 96.6 F (35.9 C) (10/02 1900) Temp Source: Bladder (10/02 1600) BP: 97/69 (10/02 1940) Pulse Rate: 139 (10/02 1940)  Labs: Recent Labs    07/28/2022 0807 08/01/2022 0814 07/25/2022 0828 07/28/2022 1021 08/03/2022 1034 08/14/2022 1406 08/04/2022 1559 08/19/2022 1824 08/20/2022 2205  HGB 13.9   < > 13.6 10.9*  --   --  11.6* 10.9*  --   HCT 41.4   < > 40.0 32.0*  --   --  34.0* 32.0*  --   PLT 365  --   --   --   --   --   --   --   --   APTT  --   --   --   --   --   --   --   --  75*  CREATININE 6.44*  --  6.70*  --   --  5.80*  --   --   --   TROPONINIHS 31*  --   --   --  37*  --   --   --   --    < > = values in this interval not displayed.     Estimated Creatinine Clearance: 8.7 mL/min (A) (by C-G formula based on SCr of 5.8 mg/dL (H)).   Medical History: Past Medical History:  Diagnosis Date   Allergy    seasonal    Anemia, iron deficiency    Antiphospholipid syndrome ([place])    with hypercoagulable state   Asthma    extrinsic; moderate, persistant, nml spirometry 2010, nml CXR 1/08   Atrial fibrillation (HCC)    Bladder troubles    REPORTS INFECTIONS ON OCCASION DUE TO URETHRA MEATUS STRICTURE AT BIRTH    CAD (coronary artery disease)    a. 50% mid LAD, 80% ostial D1 and moderate 80% mid circ by cath 2003. b. nonischemic nuc in 2013.   Cancer ([place])    basal cell removed fr. L arm    Carotid arterial disease ([place])    a. 03/2017 showed 1-39% bilateral internal carotid stenosis (high end range on left), >50% LECA stenosis, with recommendation for f/u duplex 03/2018.   CHF (congestive heart failure) (HCC)    Clotting disorder (HCC)    Complication of anesthesia    cardiac arrest- in OR, at age 6 (89)y.o. during Scalenotomy in her early 20's   Diabetes [facility])    Drug allergy  heparin/lovenox   DVT (deep venous thrombosis) (HCC)    Erosive gastritis 1994   Family history of adverse reaction to anesthesia    some family members have had trouble waking up   GERD (gastroesophageal reflux disease)    H/O hiatal hernia    Heart murmur    atrial fib, controlled   History of colon polyps    HLD (hyperlipidemia)    HTN (hypertension)    Pt states her high blood pressure readings are due tot he method of measurementsMcAlhaney at [employer], manages pt.  LOV 03/2014   Hypothyroidism    Liver spot    cyst- - no biopsy, but told that its benign    Moderate mitral regurgitation    PAF (paroxysmal atrial fibrillation) ([place])    Pulmonary embolism ([place])    related to back surgery with prior coumadin use, now off   PVC's (premature ventricular contractions)    a. noted in 09/2017.   Septic shock (HCC)    Spondylosis, lumbosacral    ARTHRITIS- OA    Urinary incontinence     Medications:  Infusions:   sodium chloride 10 mL/hr at 08/12/2022 1900   argatroban 0.5 mcg/kg/min (07/31/2022 1900)   azithromycin  Stopped (08/17/2022 0102)   fentaNYL infusion INTRAVENOUS 200 mcg/hr (08/02/2022 1933)   norepinephrine (LEVOPHED) Adult infusion 18 mcg/min (08/03/2022 2204)   piperacillin-tazobactam (ZOSYN)  IV 2.25 g (07/31/2022 2100)   propofol (DIPRIVAN) infusion 30 mcg/kg/min (08/07/2022 2057)   sodium bicarbonate 150 mEq in dextrose 5 % 1,150 mL infusion 200 mL/hr at 07/22/2022 2056    Assessment: 76 yo female on chronic Xarelto for afib, hx DVT.  Pharmacy asked to begin anticoagulation with argatroban while critically ill.  Avoiding heparin and lovenox since they are pork-derived products and patient reports a history of anaphylaxis to pork.  Last Xarelto dose reported to be 9/30.  Also with AKI, Scr up 5.8.  No known history of bleeding.  APTT at goal this evening on argatroban at 0.5 mcg/kg/min.  No overt bleeding or complications noted.  Goal of Therapy:  aPTT 50-90 seconds Monitor platelets by anticoagulation protocol: Yes   Plan:  Continue argatroban at 0.5 mcg/kg/min. Confirm aPTT in 4 hrs. Daily aPTT and CBC while on argatroban.  [place] Crane, Roylene Reason, BCCP Clinical Pharmacist  08/06/2022 10:39 PM   [facility] pharmacy phone numbers are listed on [email or web address]

## 2022-07-23 NOTE — H&P (Signed)
Critical care attending attestation note:  Patient seen and examined and relevant ancillary tests reviewed.  I agree with the assessment and plan of care as outlined by Damaris Schooner, MS IV in his consultation note.   Synopsis of assessment and plan:  76 year old woman in chronically poor health due to severe venous insufficiency and chronic pain.   Several days of increasing lethargy and cough. Brought to ED with lethargy.   Critically ill due to acute hypoxic and hypercarbic respiratory failure requiring mechanical ventilation and septic shock requiring fluid and vasopressor titration all caused by RLL community acquired pneumonia and ARDS.   On examination she is minimally responsive. Orally intubated, there is bronchial breath sounds at the right base. SpO2 in the mid 80's improved with positioning on left side. She has marked bilateral edema and rubor. She is producing a small amount of amber urine.  CXR shows a RLL consolidation. Creatinine is >6  - Continue ARDS protocol ventilation. PEEP increased to 18 per PEEP/FiO2 table with improvement in oxygenation.  - Continue to titrate NE to keep MAP> 65. Patient has 70 points of pulse pressure with low diastolic suggesting adequate  SV and severely reduced SVR. - Continue CAP coverage - Nephrology consultation as may need CRRT. Follow U/O and electrolytes as may improve with fluid resuscitation.   CRITICAL CARE Performed by: Kipp Brood   Total critical care time: 50 minutes  Critical care time was exclusive of separately billable procedures and treating other patients.  Critical care was necessary to treat or prevent imminent or life-threatening deterioration.  Critical care was time spent personally by me on the following activities: development of treatment plan with patient and/or surrogate as well as nursing, discussions with consultants, evaluation of patient's response to treatment, examination of patient, obtaining history  from patient or surrogate, ordering and performing treatments and interventions, ordering and review of laboratory studies, ordering and review of radiographic studies, pulse oximetry, re-evaluation of patient's condition and participation in multidisciplinary rounds.  Kipp Brood, MD [facility] ICU Physician [place]  Pager: [phone] Mobile: [phone] After hours: [phone].  08/15/2022, 4:39 PM

## 2022-07-23 NOTE — Procedures (Signed)
Arterial Catheter Insertion Procedure Note  Lunden Stieber [facility]  [id number]  Sep 11, 1946  Date:07/25/2022  Time:4:52 PM    Provider Performing: Kipp Brood    Procedure: Insertion of Arterial Line [phone]) with US guidance ([id number])   Indication(s) Blood pressure monitoring and/or need for frequent ABGs  Consent Unable to obtain consent due to emergent nature of procedure.  Anesthesia None   Time Out Verified patient identification, verified procedure, site/side was marked, verified correct patient position, special equipment/implants available, medications/allergies/relevant history reviewed, required imaging and test results available.   Sterile Technique Maximal sterile technique including full sterile barrier drape, hand hygiene, sterile gown, sterile gloves, mask, hair covering, sterile ultrasound probe cover (if used).   Procedure Description Area of catheter insertion was cleaned with chlorhexidine and draped in sterile fashion. With real-time ultrasound guidance an arterial catheter was placed into the left femoral artery.  Appropriate arterial tracings confirmed on monitor.     Complications/Tolerance None; patient tolerated the procedure well.   EBL Minimal   Specimen(s) None   Kipp Brood, MD [facility] ICU Physician [place]  Pager: [phone] Or Epic Secure Chat After hours: [phone].  08/18/2022, 4:53 PM

## 2022-07-23 NOTE — Sepsis Progress Note (Signed)
eLink monitoring code sepsis.  

## 2022-07-23 NOTE — Progress Notes (Signed)
BLE venous duplex has been completed.   Results can be found under chart review under CV PROC. 07/24/2022 5:58 PM Damon Hargrove RVT, RDMS

## 2022-07-23 NOTE — Progress Notes (Signed)
Patient transported from ED Room 17 to 6N82 with no complications noted.

## 2022-07-24 ENCOUNTER — Inpatient Hospital Stay (HOSPITAL_COMMUNITY): Payer: Medicare Other

## 2022-07-24 DIAGNOSIS — N179 Acute kidney failure, unspecified: Secondary | ICD-10-CM | POA: Diagnosis not present

## 2022-07-24 DIAGNOSIS — R6521 Severe sepsis with septic shock: Secondary | ICD-10-CM

## 2022-07-24 DIAGNOSIS — A419 Sepsis, unspecified organism: Secondary | ICD-10-CM | POA: Diagnosis not present

## 2022-07-24 DIAGNOSIS — I4891 Unspecified atrial fibrillation: Secondary | ICD-10-CM

## 2022-07-24 DIAGNOSIS — J9601 Acute respiratory failure with hypoxia: Secondary | ICD-10-CM | POA: Diagnosis not present

## 2022-07-24 LAB — POCT I-STAT 7, (LYTES, BLD GAS, ICA,H+H)
Acid-base deficit: 11 mmol/L — ABNORMAL HIGH (ref 0.0–2.0)
Acid-base deficit: 9 mmol/L — ABNORMAL HIGH (ref 0.0–2.0)
Acid-base deficit: 8 mmol/L — ABNORMAL HIGH (ref 0.0–2.0)
Acid-base deficit: 7 mmol/L — ABNORMAL HIGH (ref 0.0–2.0)
Acid-base deficit: 8 mmol/L — ABNORMAL HIGH (ref 0.0–2.0)
Acid-base deficit: 7 mmol/L — ABNORMAL HIGH (ref 0.0–2.0)
Bicarbonate: 16.6 mmol/L — ABNORMAL LOW (ref 20.0–28.0)
Bicarbonate: 18.2 mmol/L — ABNORMAL LOW (ref 20.0–28.0)
Bicarbonate: 17.4 mmol/L — ABNORMAL LOW (ref 20.0–28.0)
Bicarbonate: 17.5 mmol/L — ABNORMAL LOW (ref 20.0–28.0)
Bicarbonate: 17.4 mmol/L — ABNORMAL LOW (ref 20.0–28.0)
Bicarbonate: 17.4 mmol/L — ABNORMAL LOW (ref 20.0–28.0)
Calcium, Ion: 0.94 mmol/L — ABNORMAL LOW (ref 1.15–1.40)
Calcium, Ion: 0.89 mmol/L — CL (ref 1.15–1.40)
Calcium, Ion: 0.85 mmol/L — CL (ref 1.15–1.40)
Calcium, Ion: 0.82 mmol/L — CL (ref 1.15–1.40)
Calcium, Ion: 0.8 mmol/L — CL (ref 1.15–1.40)
Calcium, Ion: 0.82 mmol/L — CL (ref 1.15–1.40)
HCT: 32 % — ABNORMAL LOW (ref 36.0–46.0)
HCT: 32 % — ABNORMAL LOW (ref 36.0–46.0)
HCT: 30 % — ABNORMAL LOW (ref 36.0–46.0)
HCT: 28 % — ABNORMAL LOW (ref 36.0–46.0)
HCT: 28 % — ABNORMAL LOW (ref 36.0–46.0)
HCT: 27 % — ABNORMAL LOW (ref 36.0–46.0)
Hemoglobin: 10.9 g/dL — ABNORMAL LOW (ref 12.0–15.0)
Hemoglobin: 10.9 g/dL — ABNORMAL LOW (ref 12.0–15.0)
Hemoglobin: 10.2 g/dL — ABNORMAL LOW (ref 12.0–15.0)
Hemoglobin: 9.5 g/dL — ABNORMAL LOW (ref 12.0–15.0)
Hemoglobin: 9.5 g/dL — ABNORMAL LOW (ref 12.0–15.0)
Hemoglobin: 9.2 g/dL — ABNORMAL LOW (ref 12.0–15.0)
O2 Saturation: 94 %
O2 Saturation: 96 %
O2 Saturation: 98 %
O2 Saturation: 99 %
O2 Saturation: 96 %
O2 Saturation: 97 %
Patient temperature: 37.7
Patient temperature: 38
Patient temperature: 99.7
Patient temperature: 37.4
Patient temperature: 37.3
Patient temperature: 37.1
Potassium: 4.7 mmol/L (ref 3.5–5.1)
Potassium: 4.8 mmol/L (ref 3.5–5.1)
Potassium: 4.8 mmol/L (ref 3.5–5.1)
Potassium: 5 mmol/L (ref 3.5–5.1)
Potassium: 4.9 mmol/L (ref 3.5–5.1)
Potassium: 4.8 mmol/L (ref 3.5–5.1)
Sodium: 127 mmol/L — ABNORMAL LOW (ref 135–145)
Sodium: 126 mmol/L — ABNORMAL LOW (ref 135–145)
Sodium: 125 mmol/L — ABNORMAL LOW (ref 135–145)
Sodium: 124 mmol/L — ABNORMAL LOW (ref 135–145)
Sodium: 124 mmol/L — ABNORMAL LOW (ref 135–145)
Sodium: 123 mmol/L — ABNORMAL LOW (ref 135–145)
TCO2: 18 mmol/L — ABNORMAL LOW (ref 22–32)
TCO2: 20 mmol/L — ABNORMAL LOW (ref 22–32)
TCO2: 18 mmol/L — ABNORMAL LOW (ref 22–32)
TCO2: 19 mmol/L — ABNORMAL LOW (ref 22–32)
TCO2: 18 mmol/L — ABNORMAL LOW (ref 22–32)
TCO2: 18 mmol/L — ABNORMAL LOW (ref 22–32)
pCO2 arterial: 41.7 mmHg (ref 32–48)
pCO2 arterial: 46.8 mmHg (ref 32–48)
pCO2 arterial: 34.9 mmHg (ref 32–48)
pCO2 arterial: 33 mmHg (ref 32–48)
pCO2 arterial: 33.9 mmHg (ref 32–48)
pCO2 arterial: 31 mmHg — ABNORMAL LOW (ref 32–48)
pH, Arterial: 7.211 — ABNORMAL LOW (ref 7.35–7.45)
pH, Arterial: 7.204 — ABNORMAL LOW (ref 7.35–7.45)
pH, Arterial: 7.31 — ABNORMAL LOW (ref 7.35–7.45)
pH, Arterial: 7.336 — ABNORMAL LOW (ref 7.35–7.45)
pH, Arterial: 7.319 — ABNORMAL LOW (ref 7.35–7.45)
pH, Arterial: 7.358 (ref 7.35–7.45)
pO2, Arterial: 87 mmHg (ref 83–108)
pO2, Arterial: 109 mmHg — ABNORMAL HIGH (ref 83–108)
pO2, Arterial: 112 mmHg — ABNORMAL HIGH (ref 83–108)
pO2, Arterial: 162 mmHg — ABNORMAL HIGH (ref 83–108)
pO2, Arterial: 90 mmHg (ref 83–108)
pO2, Arterial: 96 mmHg (ref 83–108)

## 2022-07-24 LAB — CBC
HCT: 33.2 % — ABNORMAL LOW (ref 36.0–46.0)
Hemoglobin: 11.3 g/dL — ABNORMAL LOW (ref 12.0–15.0)
MCH: 28.6 pg (ref 26.0–34.0)
MCHC: 34 g/dL (ref 30.0–36.0)
MCV: 84.1 fL (ref 80.0–100.0)
Platelets: 295 10*3/uL (ref 150–400)
RBC: 3.95 MIL/uL (ref 3.87–5.11)
RDW: 14.9 % (ref 11.5–15.5)
WBC: 24.9 10*3/uL — ABNORMAL HIGH (ref 4.0–10.5)
nRBC: 0 % (ref 0.0–0.2)

## 2022-07-24 LAB — BASIC METABOLIC PANEL
Anion gap: 32 — ABNORMAL HIGH (ref 5–15)
BUN: 85 mg/dL — ABNORMAL HIGH (ref 8–23)
CO2: 16 mmol/L — ABNORMAL LOW (ref 22–32)
Calcium: 7.6 mg/dL — ABNORMAL LOW (ref 8.9–10.3)
Chloride: 83 mmol/L — ABNORMAL LOW (ref 98–111)
Creatinine, Ser: 5.31 mg/dL — ABNORMAL HIGH (ref 0.44–1.00)
GFR, Estimated: 8 mL/min — ABNORMAL LOW (ref >60)
Glucose, Bld: 217 mg/dL — ABNORMAL HIGH (ref 70–99)
Potassium: 5 mmol/L (ref 3.5–5.1)
Sodium: 131 mmol/L — ABNORMAL LOW (ref 135–145)

## 2022-07-24 LAB — GLUCOSE, CAPILLARY
Glucose-Capillary: 139 mg/dL — ABNORMAL HIGH (ref 70–99)
Glucose-Capillary: 167 mg/dL — ABNORMAL HIGH (ref 70–99)
Glucose-Capillary: 170 mg/dL — ABNORMAL HIGH (ref 70–99)
Glucose-Capillary: 177 mg/dL — ABNORMAL HIGH (ref 70–99)
Glucose-Capillary: 189 mg/dL — ABNORMAL HIGH (ref 70–99)
Glucose-Capillary: 189 mg/dL — ABNORMAL HIGH (ref 70–99)
Glucose-Capillary: 219 mg/dL — ABNORMAL HIGH (ref 70–99)

## 2022-07-24 LAB — LACTIC ACID, PLASMA
Lactic Acid, Venous: >9 mmol/L (ref 0.5–1.9)
Lactic Acid, Venous: >9 mmol/L (ref 0.5–1.9)

## 2022-07-24 LAB — APTT
aPTT: 79 seconds — ABNORMAL HIGH (ref 24–36)
aPTT: 99 seconds — ABNORMAL HIGH (ref 24–36)
aPTT: >200 seconds (ref 24–36)

## 2022-07-24 MED ORDER — VASOPRESSIN 20 UNITS/100 ML INFUSION FOR SHOCK
0.0000 [IU]/min | INTRAVENOUS | Status: DC
  Administered 2022-07-24 – 2022-07-25 (×4): 0.03 [IU]/min via INTRAVENOUS
  Administered 2022-07-26: 0.02 [IU]/min via INTRAVENOUS
  Filled 2022-07-24 (×5): qty 100

## 2022-07-24 MED ORDER — ALBUMIN HUMAN 5 % IV SOLN
12.5000 g | Freq: Once | INTRAVENOUS | Status: AC
  Administered 2022-07-24: 12.5 g via INTRAVENOUS
  Filled 2022-07-24: qty 250

## 2022-07-24 MED ORDER — NOREPINEPHRINE 16 MG/250ML-% IV SOLN
0.0000 ug/min | INTRAVENOUS | Status: DC
  Administered 2022-07-24: 29 ug/min via INTRAVENOUS
  Filled 2022-07-24 (×2): qty 250

## 2022-07-24 MED ORDER — HYDROCORTISONE SOD SUC (PF) 100 MG IJ SOLR
100.0000 mg | Freq: Two times a day (BID) | INTRAMUSCULAR | Status: DC
  Administered 2022-07-24 – 2022-07-26 (×5): 100 mg via INTRAVENOUS
  Filled 2022-07-24 (×5): qty 2

## 2022-07-24 MED ORDER — CALCIUM GLUCONATE-NACL 2-0.675 GM/100ML-% IV SOLN
2.0000 g | Freq: Once | INTRAVENOUS | Status: AC
  Administered 2022-07-24: 2000 mg via INTRAVENOUS
  Filled 2022-07-24: qty 100

## 2022-07-24 MED ORDER — VASOPRESSIN 20 UNITS/100 ML INFUSION FOR SHOCK
INTRAVENOUS | Status: AC
  Administered 2022-07-24: 0.03 [IU]/min via INTRAVENOUS
  Filled 2022-07-24: qty 100

## 2022-07-24 MED ORDER — VASOPRESSIN 20 UNITS/100 ML INFUSION FOR SHOCK: 0.0000 [IU]/min | INTRAVENOUS | Status: DC

## 2022-07-24 MED ORDER — FENTANYL CITRATE (PF) 2500 MCG/50ML IJ SOLN
25.0000 ug/h | Status: DC
  Administered 2022-07-24 – 2022-07-25 (×2): 150 ug/h via INTRAVENOUS
  Administered 2022-07-26: 100 ug/h via INTRAVENOUS
  Filled 2022-07-24 (×3): qty 100

## 2022-07-24 NOTE — Progress Notes (Signed)
[place] Progress Note Patient Name: Latoya [name] [facility] DOB: 1946-05-27 MRN: [id number]   Date of Service  07/24/2022  HPI/Events of Note  ABG reviewed. Albumin  2.0 gm / dl.  eICU Interventions  Calcium gluconate 2 gm iv bolus, Lactic acid trended, albumin 5 % 250 ml iv bolus x 1.        Kerry Kass Eloyse Causey 07/24/2022, 8:28 PM

## 2022-07-24 NOTE — Progress Notes (Signed)
[place] for IV argatroban Indication: atrial fibrillation, hx DVT, antiphospholipid syndrome  Allergies  Allergen Reactions   Chlorhexidine Anaphylaxis, Itching and Other (See Comments)    Sensitive to dye in Betadine & Chlorohexadine    Enoxaparin Sodium Other (See Comments)    Hx anaphylaxis to pork   Heparin Other (See Comments)    Hx anaphylaxis to pork   Hornet Venom Anaphylaxis and Other (See Comments)    European Hornets   Pork-Derived Products Anaphylaxis   Povidone-Iodine Itching, Rash and Other (See Comments)    NOTE: Patient has had anaphylactic reaction to Betadine due to dyes in product. Sensitivity-" but if its wiped off she is able to tolerate betadine"    Indomethacin Rash   Lactose Intolerance (Gi) Diarrhea, Nausea And Vomiting and Other (See Comments)    Can tolerate most [place] cheeses (Takes Lactaid)   Phenylpropanolamine Hypertension    Patient Measurements: Height: '5\' 2"'$  (157.5 cm) Weight: 91.9 kg (202 lb 9.6 oz) IBW/kg (Calculated) : 50.1 Heparin Dosing Weight: n/a  Vital Signs: Temp: 99.3 F (37.4 C) (10/03 1537) Temp Source: Bladder (10/03 1200) BP: 120/58 (10/03 1200) Pulse Rate: 106 (10/03 1215)  Labs: Recent Labs    08/19/2022 0807 08/19/2022 0814 08/01/2022 0828 08/20/2022 1021 08/14/2022 1034 08/01/2022 1406 08/09/2022 1559 08/05/2022 2205 08/05/2022 2329 07/24/22 0438 07/24/22 0542 07/24/22 1123 07/24/22 1546 07/24/22 1701  HGB 13.9   < > 13.6   < >  --   --    < >  --    < > 11.3* 10.9* 10.2* 9.5*  --   HCT 41.4   < > 40.0   < >  --   --    < >  --    < > 33.2* 32.0* 30.0* 28.0*  --   PLT 365  --   --   --   --   --   --   --   --  295  --   --   --   --   APTT  --   --   --   --   --   --   --  75*  --  79*  --   --   --  99*  CREATININE 6.44*  --  6.70*  --   --  5.80*  --   --   --  5.31*  --   --   --   --   TROPONINIHS 31*  --   --   --  37*  --   --   --   --   --   --   --   --   --    < > =  values in this interval not displayed.     Estimated Creatinine Clearance: 9.7 mL/min (A) (by C-G formula based on SCr of 5.31 mg/dL (H)).   Medical History: Past Medical History:  Diagnosis Date   Allergy    seasonal   Anemia, iron deficiency    Antiphospholipid syndrome ([place])    with hypercoagulable state   Asthma    extrinsic; moderate, persistant, nml spirometry 2010, nml CXR 1/08   Atrial fibrillation (HCC)    Bladder troubles    REPORTS INFECTIONS ON OCCASION DUE TO URETHRA MEATUS STRICTURE AT BIRTH    CAD (coronary artery disease)    a. 50% mid LAD, 80% ostial D1 and moderate 80% mid circ by cath 2003. b. nonischemic nuc in  2013.   Cancer ([place])    basal cell removed fr. L arm    Carotid arterial disease ([place])    a. 03/2017 showed 1-39% bilateral internal carotid stenosis (high end range on left), >50% LECA stenosis, with recommendation for f/u duplex 03/2018.   CHF (congestive heart failure) (HCC)    Clotting disorder (HCC)    Complication of anesthesia    cardiac arrest- in OR, at age 5 (89)y.o. during Scalenotomy in her early 20's   Diabetes [facility])    Drug allergy    heparin/lovenox   DVT (deep venous thrombosis) (HCC)    Erosive gastritis 1994   Family history of adverse reaction to anesthesia    some family members have had trouble waking up   GERD (gastroesophageal reflux disease)    H/O hiatal hernia    Heart murmur    atrial fib, controlled   History of colon polyps    HLD (hyperlipidemia)    HTN (hypertension)    Pt states her high blood pressure readings are due tot he method of measurementsMcAlhaney at [employer], manages pt.  LOV 03/2014   Hypothyroidism    Liver spot    cyst- - no biopsy, but told that its benign    Moderate mitral regurgitation    PAF (paroxysmal atrial fibrillation) ([place])    Pulmonary embolism ([place])    related to back surgery with prior coumadin use, now off   PVC's (premature ventricular contractions)    a. noted in 09/2017.    Septic shock (HCC)    Spondylosis, lumbosacral    ARTHRITIS- OA    Urinary incontinence     Medications:  Infusions:   sodium chloride 10 mL/hr at 07/24/22 1100   amiodarone 30 mg/hr (07/24/22 1200)   argatroban 0.5 mcg/kg/min (07/24/22 1200)   azithromycin Stopped (07/24/22 1039)   fentaNYL infusion INTRAVENOUS 150 mcg/hr (07/24/22 1200)   norepinephrine (LEVOPHED) Adult infusion 6 mcg/min (07/24/22 1200)   piperacillin-tazobactam (ZOSYN)  IV 2.25 g (07/24/22 1401)   propofol (DIPRIVAN) infusion 20 mcg/kg/min (07/24/22 1200)   sodium bicarbonate 150 mEq in dextrose 5 % 1,150 mL infusion 200 mL/hr at 07/24/22 1502   vasopressin 0.03 Units/min (07/24/22 1200)    Assessment: 76 yo female on chronic Xarelto for afib, hx DVT.  Pharmacy asked to begin anticoagulation with argatroban while critically ill.  Avoiding heparin and lovenox since they are pork-derived products and patient reports a history of anaphylaxis to pork.  Last Xarelto dose reported to be 9/30.  Also with AKI, Scr up 5.8.  No known history of bleeding.  aPTT elevated this evening on argatroban at 0.5 mcg/kg/min.  No issues with infusion or bleeding per RN.  Goal of Therapy:  aPTT 50-90 seconds Monitor platelets by anticoagulation protocol: Yes   Plan:  Decrease argatroban to 0.4 mcg/kg/min. Check aPTT in 4 hrs. Daily aPTT and CBC while on argatroban.  Francena Hanly, PharmD Pharmacy Resident  07/24/2022 5:54 PM

## 2022-07-24 NOTE — Progress Notes (Signed)
Cortrak Tube Team Note:  Consult received to place a Cortrak feeding tube.   RD unable to advance cortrak tube past laryngeal area after many attempts. Tube repeatedly coils in mouth or deflects towards airway. Recommend fluoroscopy guided NGT placement if tube is still needed.   Koleen Distance MS, RD, LDN Please refer to [facility] for RD and/or RD on-call/weekend/after hours pager

## 2022-07-24 NOTE — Progress Notes (Signed)
NAME:  Latoya [name], MRN:  [id number], DOB:  06-25-1946, LOS: 1 ADMISSION DATE:  08/17/2022, CONSULTATION DATE:  07/22/2022 REFERRING MD:  Dr. Gilford Raid, CHIEF COMPLAINT:  Hypoxemia   History of Present Illness:  76 y o F presented to ED with altered mental status, hypoxemic to 70% on room air.  Patient was placed on nonrebreather, and then subsequently intubated due to acute hypoxic respiratory failure.  Patient's last known well was Sunday.  Per husband, patient had preceding cough, and decreased energy.  She also reported increased pain in her feet, but at baseline is able to ambulate with a walker.  It is unclear if patient has been adherent to medications prior to presentation in emergency department.  Per husband at bedside, patient is a DNR; however, this is not reciprocated in patient's chart.  In ED:  CT of head demonstrated no acute intracranial abnormality.  CT angiography AO +BLFEM showed mild to moderate stenosis at the origin of the left posterior tibial artery without identified flow-limiting stenosis of any other lower extremity arteries.  There is three-vessel runoff to the ankles bilaterally.  No abnormality of the abdominal aorta and mesenteric arteries.  Bilateral lower lobe and right middle lobe patchy opacities were identified. Chest x-ray with multiple bilateral patchy airspace opacities, right greater than left, concerning for multifocal pneumonia.  Patient placed with left side down, with resulting improved oxygenation.  ABG progressively acidotic, with most recent at 7.065/40.7/72/11 0.5, as such bicarb GTT and 2 ampoules of sodium bicarbonate ordered.  Ceftriaxone and azithromycin administered prior to CCM consult.  Patient has right femoral vein central line, and 1 left UE peripheral IV for access.    ED labs:  - ABG: 7.065/40.7/72/11.5 - WBC at 19.8 - anion gap: 30 -Creatinine is 6.7 (baseline of 0.8), BUN of 64 -BNP of 312.3 -Sodium 129, potassium 4.9, glucose 64,   -High sensitive troponin 31 >37 -Lactate > 9  Pertinent  Medical History  Hemangioma of liver Hypothyroidism (Synthroid 100 mcg daily) Anemia of chronic disease Hyperlipidemia essential  Hypertension (Lopressor 100 mg twice daily, Cozaar 0.5 mg daily) CAD in native artery Asthma GERD Peripheral artery disease Cervical pseudoarthrosis History of enterococcal bacteremia History of E. coli bacteremia History of pulmonary embolism (Xarelto 20 mg daily) Paroxysmal atrial fibrillation Mitral vegetation History of DVT Diverticulosis AVM of colon without hemorrhage Chronic back pain   Significant Hospital Events: Including procedures, antibiotic start and stop dates in addition to other pertinent events   Patient presented to ED with acute hypoxic respiratory failure, subsequently intubated, on max FiO2.  PEEP and respiratory rate were increased to augment persistent hypoxemia.  Right femoral central venous catheter placed for medication access.  Interim History / Subjective:  Patient remains on vasopressor support Currently on full mechanical ventilatory support All extremities are mottled and cold but unable to feel pulses on right lower extremity  Objective   Blood pressure (!) 118/48, pulse 94, temperature 99.7 F (37.6 C), resp. rate (!) 32, height '5\' 2"'$  (1.575 m), weight 91.9 kg, SpO2 96 %.    Vent Mode: PRVC FiO2 (%):  [80 %-100 %] 80 % Set Rate:  [18 bmp-35 bmp] 35 bmp Vt Set:  [320 mL-400 mL] 400 mL PEEP:  [10 cmH20-18 cmH20] 16 cmH20 Plateau Pressure:  [18 cmH20-30 cmH20] 28 cmH20   Intake/Output Summary (Last 24 hours) at 07/24/2022 0915 Last data filed at 07/24/2022 0800 Gross per 24 hour  Intake 6110.38 ml  Output 150 ml  Net 5960.38  ml   Filed Weights   08/12/2022 0800 08/09/2022 1205 07/24/22 0458  Weight: 74.8 kg 89 kg 91.9 kg    Examination:  Physical exam: General: Crtitically ill-appearing female, orally intubated HEENT: [place]/AT, eyes anicteric.  ETT  and OGT in place Neuro: Sedated, not following commands.  Eyes are closed.  Pupils 3 mm bilateral reactive to light Chest: Bilateral rhonchorous breath sounds right more than left, no wheezes or crackles Heart: Regular rate and rhythm, no murmurs or gallops Abdomen: Soft, nontender, nondistended, bowel sounds present Skin: Mottled all 4 extremities   Resolved Hospital Problem list     Assessment & Plan:  Acute hypoxic/hypercapnic respiratory failure  Acute septic encephalopathy Septic shock Right lobe pneumonia lactic acidosis Continue lung protective ventilation Not ready for SBT/SAT Continue titrate FiO2 with O2 sat goal 92% Avoid deep sedation Continue IV vasopressor with map goal 65 Continue IV antibiotics Follow-up cultures We will proceed with bronchoscopy Trend lactate  Follow-up Legionella urine antigens  AKI High anion gap metabolic acidosis Hyponatremia Hypocalcemia Potentially ATN secondary to septic shock Baseline serum creatinine is 0.8 Serum creatinine has trended down likely due to dilution from volume resuscitation Appreciate nephrology follow-up Closely monitor electrolytes Continue bicarbonate infusion Strict I's and O's Avoid nephrotoxic medications  Paroxysmal atrial fibrillation with RVR Patient had runs of A-fib with RVR Started on amiodarone infusion She is allergic to heparin, currently on catheter 1 for stroke prophylaxis  Peripheral vascular disease Patient has mottled all 4 extremities She does not have dopplerable pulse in right lower extremity, it is cold to touch CT angiogram showed moderate narrowing of the posterior tibial artery Vascular surgery consulted  History of DVT and PE Patient is on Xarelto at home Currently continue argatroban   Diabetes, type II Continue sliding scale insulin   Best Practice (right click and "Reselect all SmartList Selections" daily)   Diet/type: NPO DVT prophylaxis: other  GI prophylaxis:  PPI Lines: Central line, Arterial Line, and yes and it is still needed Foley:  Yes, and it is still needed Code Status:  DNR Last date of multidisciplinary goals of care discussion [10/03 patient's husband was updated at bedside]  Labs   CBC: Recent Labs  Lab 07/31/2022 0807 08/08/2022 0814 08/05/2022 1824 08/09/2022 2329 07/24/22 0128 07/24/22 0438 07/24/22 0542  WBC 19.8*  --   --   --   --  24.9*  --   NEUTROABS 16.5*  --   --   --   --   --   --   HGB 13.9   < > 10.9* 11.6* 10.9* 11.3* 10.9*  HCT 41.4   < > 32.0* 34.0* 32.0* 33.2* 32.0*  MCV 86.4  --   --   --   --  84.1  --   PLT 365  --   --   --   --  295  --    < > = values in this interval not displayed.    Basic Metabolic Panel: Recent Labs  Lab 08/13/2022 0807 08/06/2022 0814 08/02/2022 0828 07/25/2022 1021 07/28/2022 1406 07/22/2022 1559 08/19/2022 1824 08/10/2022 2329 07/24/22 0128 07/24/22 0438 07/24/22 0542  NA 135   < > 131*   < > 134*   < > 130* 129* 127* 131* 126*  K 5.2*   < > 5.0   < > 5.3*   < > 4.5 4.5 4.7 5.0 4.8  CL 95*  --  102  --  96*  --   --   --   --  83*  --   CO2 10*  --   --   --  14*  --   --   --   --  16*  --   GLUCOSE 71  --  64*  --  63*  --   --   --   --  217*  --   BUN 96*  --  113*  --  90*  --   --   --   --  85*  --   CREATININE 6.44*  --  6.70*  --  5.80*  --   --   --   --  5.31*  --   CALCIUM 9.4  --   --   --  7.8*  --   --   --   --  7.6*  --    < > = values in this interval not displayed.   GFR: Estimated Creatinine Clearance: 9.7 mL/min (A) (by C-G formula based on SCr of 5.31 mg/dL (H)). Recent Labs  Lab 07/28/2022 0807 08/02/2022 1034 07/31/2022 1406 07/24/2022 1636 07/24/22 0438  PROCALCITON  --   --  44.06  --   --   WBC 19.8*  --   --   --  24.9*  LATICACIDVEN >9.0* 8.0*  --  7.7*  --     Liver Function Tests: Recent Labs  Lab 08/07/2022 1406  AST 128*  ALT 61*  ALKPHOS 172*  BILITOT 0.7  PROT 5.1*  ALBUMIN 2.0*   No results for input(s): "LIPASE", "AMYLASE" in the last  168 hours. No results for input(s): "AMMONIA" in the last 168 hours.  ABG    Component Value Date/Time   PHART 7.204 (L) 07/24/2022 0542   PCO2ART 46.8 07/24/2022 0542   PO2ART 109 (H) 07/24/2022 0542   HCO3 18.2 (L) 07/24/2022 0542   TCO2 20 (L) 07/24/2022 0542   ACIDBASEDEF 9.0 (H) 07/24/2022 0542   O2SAT 96 07/24/2022 0542     Coagulation Profile: No results for input(s): "INR", "PROTIME" in the last 168 hours.  Cardiac Enzymes: No results for input(s): "CKTOTAL", "CKMB", "CKMBINDEX", "TROPONINI" in the last 168 hours.  HbA1C: Hemoglobin A1C  Date/Time Value Ref Range Status  12/21/2015 12:00 AM 5.8  Final  04/22/2013 12:00 AM 6.1  Final   Hgb A1c MFr Bld  Date/Time Value Ref Range Status  04/16/2022 02:39 PM 7.8 (H) 4.6 - 6.5 % Final    Comment:    Glycemic Control Guidelines for People with Diabetes:Non Diabetic:  <6%Goal of Therapy: <7%Additional Action Suggested:  >8%   10/11/2021 02:16 PM 7.2 (H) 4.6 - 6.5 % Final    Comment:    Glycemic Control Guidelines for People with Diabetes:Non Diabetic:  <6%Goal of Therapy: <7%Additional Action Suggested:  >8%     CBG: Recent Labs  Lab 07/24/2022 1957 08/14/2022 2350 07/24/22 0046 07/24/22 0332 07/24/22 0741  GLUCAP 117* 179* 167* 139* 170*    Total critical care time: 48 minutes  Performed by: [place] care time was exclusive of separately billable procedures and treating other patients.   Critical care was necessary to treat or prevent imminent or life-threatening deterioration.   Critical care was time spent personally by me on the following activities: development of treatment plan with patient and/or surrogate as well as nursing, discussions with consultants, evaluation of patient's response to treatment, examination of patient, obtaining history from patient or surrogate, ordering and performing treatments and interventions, ordering and review of laboratory studies,  ordering and review of  radiographic studies, pulse oximetry and re-evaluation of patient's condition.   Jacky Kindle, MD [facility] See Amion for pager If no response to pager, please call [phone] until 7pm After 7pm, Please call E-link [phone]

## 2022-07-24 NOTE — Progress Notes (Signed)
Patient ID: Latoya [name], female   DOB: 1946-01-06, 76 y.o.   MRN: [id number] [facility] Progress Note   Assessment/ Plan:   1.  Acute kidney injury: Possibly hemodynamically mediated acute kidney injury in the setting of volume contraction with ongoing ARB/diuretic use; evolving into ATN.  Overnight with some improvement of BUN/creatinine which in the setting of oliguria/anuria is likely dilutional from fluid loading/resuscitation.  She remains acidotic and potassium level slowly creeping up; although she does not have any acute indications for replacement therapy at this time, I suspect that if she remains oliguric/anuric overnight will require this within the next 24 hours based on overall prognosis. 2.  Septic shock: Suspected to be secondary to right lower lobe pneumonia (possible aspiration event versus de novo community-acquired pneumonia).  On broad-spectrum antimicrobial coverage and pressor support. 3.  Altered mental status: Secondary to septic shock, monitor with management of underlying process. 4.  Acute hypoxic respiratory failure: Intubated and currently ongoing ventilator support/adjustments per CCM and respiratory service. 5.  Peripheral vascular disease: Significant duskiness/impaired perfusion of lower extremity noted on physical exam with focal finding on CT arteriogram.  Additional management/work-up per primary service.  Would avoid Xarelto in the setting of acute kidney injury and consider alternative anticoagulation.  Subjective:   With atrial fibrillation with rapid ventricular response overnight for which she got amiodarone bolus and started on amiodarone drip.  Increased lower extremity mottling.   Objective:   BP (!) 118/48 (BP Location: Right Arm)   Pulse 94   Temp 99.7 F (37.6 C)   Resp (!) 32   Ht '5\' 2"'$  (1.575 m)   Wt 91.9 kg   SpO2 96%   BMI 37.06 kg/m   Intake/Output Summary (Last 24 hours) at 07/24/2022 0903 Last data filed at 07/24/2022  0800 Gross per 24 hour  Intake 6110.38 ml  Output 150 ml  Net 5960.38 ml   Weight change:   Physical Exam: Gen: Appears comfortable on ventilator, unresponsive CVS: Pulse regular rhythm, normal rate, S1 and S2 normal Resp: Anteriorly clear to auscultation, no rales/rhonchi Abd: Soft, obese, nontender Ext: Bilateral lower extremities with mottling.  Left leg with associated erythema/skin blistering.  Imaging: DG Chest Port 1 View  Result Date: 07/24/2022 CLINICAL DATA:  Ventilator dependent respiratory failure. EXAM: PORTABLE CHEST 1 VIEW COMPARISON:  Portable chest yesterday at 4:52 p.m. FINDINGS: 5:23 a.m. ETT tip is 4 cm from the carina. The patient is rotated somewhat to the right. The cardiac size is normal.  Stable mediastinal configuration. Patchy airspace consolidation throughout the right lung fields, small less confluent airspace disease left infrahilar lower lobe area, are redemonstrated unchanged with left lung otherwise clear. There is a chronically elevated right hemidiaphragm. No pleural collection is seen. Fusion hardware again noted in the cervical and lower thoracic/upper lumbar spine. IMPRESSION: No new abnormality. Right-greater-than-left airspace disease is similar. Overall aeration is unchanged. Electronically Signed   By: Telford Nab M.D.   On: 07/24/2022 07:41   VAS [place] LOWER EXTREMITY VENOUS (DVT)  Result Date: 08/03/2022  Lower Venous DVT Study Patient Name:  Latoya [name]  Date of Exam:   07/25/2022 Medical Rec #: [id number]       Accession #:    [id number] Date of Birth: 18-Sep-1946       Patient Gender: F Patient Age:   82 years Exam Location:  [facility] Procedure:      VAS [place] LOWER EXTREMITY VENOUS (DVT) Referring Phys: Kipp Brood --------------------------------------------------------------------------------  Indications: Edema.  Risk Factors: HX of DVT & PE, antiphospholipid syndrome. Limitations: Body habitus, poor ultrasound/tissue interface and  line placements to bilateral groin areas with overlying bandages and left side catheter placement to upper thigh. Comparison Study: No previous exams Performing Technologist: Jody Hill RVT, RDMS  Examination Guidelines: A complete evaluation includes B-mode imaging, spectral Doppler, color Doppler, and power Doppler as needed of all accessible portions of each vessel. Bilateral testing is considered an integral part of a complete examination. Limited examinations for reoccurring indications may be performed as noted. The reflux portion of the exam is performed with the patient in reverse Trendelenburg.  +--------+---------------+---------+-----------+----------+--------------------+ RIGHT   CompressibilityPhasicitySpontaneityPropertiesThrombus Aging       +--------+---------------+---------+-----------+----------+--------------------+ CFV                    Yes      Yes                  patent by                                                                 color/doppler        +--------+---------------+---------+-----------+----------+--------------------+ SFJ                                                  Not visualized       +--------+---------------+---------+-----------+----------+--------------------+ FV Prox Full           Yes      Yes                                       +--------+---------------+---------+-----------+----------+--------------------+ FV Mid  Full           Yes      Yes                                       +--------+---------------+---------+-----------+----------+--------------------+ FV      Full           Yes      Yes                                       Distal                                                                    +--------+---------------+---------+-----------+----------+--------------------+ PFV  Not visualized        +--------+---------------+---------+-----------+----------+--------------------+ POP     Full           Yes      Yes                                       +--------+---------------+---------+-----------+----------+--------------------+ PTV                                                  Not visualized       +--------+---------------+---------+-----------+----------+--------------------+ PERO                                                 Not visualized       +--------+---------------+---------+-----------+----------+--------------------+   +--------+---------------+---------+-----------+----------+--------------------+ LEFT    CompressibilityPhasicitySpontaneityPropertiesThrombus Aging       +--------+---------------+---------+-----------+----------+--------------------+ CFV     Full           Yes      Yes                                       +--------+---------------+---------+-----------+----------+--------------------+ SFJ     Full                                                              +--------+---------------+---------+-----------+----------+--------------------+ FV Prox Full           Yes      Yes                                       +--------+---------------+---------+-----------+----------+--------------------+ FV Mid  Full           Yes      Yes                                       +--------+---------------+---------+-----------+----------+--------------------+ FV      Full           Yes      Yes                                       Distal                                                                    +--------+---------------+---------+-----------+----------+--------------------+ PFV  Yes      Yes                  patent by                                                                 color/doppler        +--------+---------------+---------+-----------+----------+--------------------+  POP     Full           Yes      Yes                                       +--------+---------------+---------+-----------+----------+--------------------+ PTV     Full                                                              +--------+---------------+---------+-----------+----------+--------------------+ PERO    Full                                                              +--------+---------------+---------+-----------+----------+--------------------+     Summary: BILATERAL: -No evidence of popliteal cyst, bilaterally. RIGHT: - There is no evidence of deep vein thrombosis in the lower extremity. However, portions of this examination were limited- see technologist comments above.  LEFT: - There is no evidence of deep vein thrombosis in the lower extremity. However, portions of this examination were limited- see technologist comments above.  *See table(s) above for measurements and observations. Electronically signed by Servando Snare MD on 08/12/2022 at 7:50:54 PM.    Final    DG Chest Portable 1 View  Result Date: 08/03/2022 CLINICAL DATA:  Intubation. EXAM: PORTABLE CHEST 1 VIEW COMPARISON:  Earlier same day chest radiograph at 0816 hours FINDINGS: Of note, patient is mildly rotated and tilted on this portable examination. Endotracheal tube tip projects 3.9 cm above the carina. Unchanged multifocal patchy airspace opacities, right-greater-than-left. No pleural effusion or pneumothorax. Heart is normal in size. Spinal instrumentation in the distal cervical and thoracic spine. IMPRESSION: Endotracheal tube tip projects 3.9 cm above the carina. Unchanged multifocal patchy airspace opacities, right-greater-than-left. Electronically Signed   By: Ileana Roup M.D.   On: 07/22/2022 17:15   CT Angio Aortobifemoral W and/or Wo Contrast  Result Date: 08/18/2022 CLINICAL DATA:  Altered mental status Vomiting Nonpalpable lower extremity pulses Discolored lower extremities below the knee EXAM:  CT ANGIOGRAPHY OF ABDOMINAL AORTA WITH ILIOFEMORAL RUNOFF TECHNIQUE: Multidetector CT imaging of the abdomen, pelvis and lower extremities was performed using the standard protocol during bolus administration of intravenous contrast. Multiplanar CT image reconstructions and MIPs were obtained to evaluate the vascular anatomy. RADIATION DOSE REDUCTION: This exam was performed according to the departmental dose-optimization program which includes automated exposure control, adjustment of the mA and/or kV according to patient size and/or use of iterative reconstruction  technique. CONTRAST:  100 mL OMNIPAQUE IOHEXOL 350 MG/ML SOLN COMPARISON:  None available FINDINGS: VASCULAR Aorta: Normal caliber aorta without aneurysm, dissection, vasculitis or significant stenosis. Celiac: Patent without evidence of aneurysm, dissection, vasculitis or significant stenosis. SMA: Patent without evidence of aneurysm, dissection, vasculitis or significant stenosis. Renals: Both renal arteries are patent without evidence of aneurysm, dissection, vasculitis, fibromuscular dysplasia or significant stenosis. IMA: Patent without evidence of aneurysm, dissection, vasculitis or significant stenosis. RIGHT Lower Extremity Inflow: Mild diffuse calcified plaque of the common iliac artery. Evaluation for stenosis is limited due to streak artifact from spine hardware. Evaluation of the origin of the internal iliac artery is limited due to streak artifact from spine hardware. The internal iliac artery branches are patent. Evaluation of the origin of the external iliac artery is limited due to streak artifact. It is otherwise patent. Outflow: Mild calcified plaque in the common femoral artery without flow-limiting stenosis. Profunda femoris artery is patent without significant stenosis. Minimal focal calcified plaque of the distal superficial femoral artery without flow-limiting stenosis. Popliteal artery is diminutive but patent without significant  stenosis. Runoff: Patent three vessel runoff to the ankle. LEFT Lower Extremity Inflow: Common, internal and external iliac arteries are patent without evidence of aneurysm, dissection, vasculitis or significant stenosis. Outflow: No significant stenosis of the common femoral artery. The profundus femoris artery is patent. There is mild stenosis at the origin due to focal calcified plaque. The superficial femoral artery is patent no significant stenosis. Evaluation of the left popliteal artery is significantly limited due to streak artifact from left total knee prosthesis. The distal popliteal artery is patent. Runoff: Tibioperoneal trunk is mildly narrowed due to calcified plaque. There is mild to moderate stenosis at the origin of the posterior tibial artery due to calcified plaque. There is three-vessel runoff to the left ankle. Veins: Right common femoral vein approach central venous catheter terminates in the common iliac vein. Review of the MIP images confirms the above findings. NON-VASCULAR Lower chest: Airspace opacity within the lower lobes bilaterally may be due to atelectasis or pneumonia. Bronchiectasis also noted in the right middle lobe with adjacent airspace opacity. Hepatobiliary: Visualized portions the liver are unremarkable. Postsurgical changes of cholecystectomy are seen. No bile duct dilatation. Pancreas: Unremarkable. No pancreatic ductal dilatation or surrounding inflammatory changes. Spleen: Normal in size without focal abnormality. Adrenals/Urinary Tract: Adrenal glands are unremarkable, evaluation of the kidneys, particularly the left side is limited due to streak artifact as well as motion. No hydronephrosis or hydroureter. The bladder is collapsed around a Foley catheter balloon which limits evaluation. Stomach/Bowel: Small hiatal hernia. Small duodenal diverticulum. No bowel dilatation to indicate ileus or obstruction. Appendix is not definitively identified, however no right lower  quadrant phlegm a tori changes are present. Evaluation of the bowel is limited due to respiratory motion. Scattered colonic diverticula are present without evidence of acute diverticulitis. Lymphatic: No enlarged abdominal or pelvic lymph nodes. Reproductive: Uterus is either atrophic or absent. It is not well visualized. No adnexal abnormalities. Other: Bilateral breast prostheses are partially visualized. Single surgical clip noted in the left rectus sheath. Musculoskeletal: Long segment spinal fusion changes again seen. Left total knee prosthesis is seen. No acute osseous abnormality. Limitations: Evaluation limited due to respiratory motion and streak artifact. IMPRESSION: 1. Mild to moderate stenosis at the origin of the left posterior tibial artery. Otherwise, no flow-limiting stenosis of the lower extremity arteries. Three-vessel runoff to the ankles bilaterally. 2. No significant abnormality of the abdominal aorta or mesenteric  arteries. NON-VASCULAR 1. Bilateral lower lobe and right middle lobe airspace opacities, suspicious for multifocal pneumonia. 2. Colonic diverticulosis. Electronically Signed   By: Miachel Roux M.D.   On: 08/11/2022 10:41   CT Head Wo Contrast  Result Date: 08/02/2022 CLINICAL DATA:  Altered mental status.  Patient found on floor. EXAM: CT HEAD WITHOUT CONTRAST TECHNIQUE: Contiguous axial images were obtained from the base of the skull through the vertex without intravenous contrast. RADIATION DOSE REDUCTION: This exam was performed according to the departmental dose-optimization program which includes automated exposure control, adjustment of the mA and/or kV according to patient size and/or use of iterative reconstruction technique. COMPARISON:  CT head 09/16/2009 FINDINGS: Of note, streak artifact from dental hardware obscures posterior fossa. Brain: No evidence of acute infarction, hemorrhage, hydrocephalus, extra-axial collection or mass lesion/mass effect. Generalized  parenchymal volume loss, within normal limits for patient age. Scattered hypodensities in the periventricular and subcortical white matter, consistent with chronic small-vessel ischemic changes. Vascular: Vascular calcifications at the skull base no hyperdense vessel or unexpected calcification. Skull: Hyperostosis frontalis interna. Negative for fracture or focal lesion. Sinuses/Orbits: No acute finding. Trace mucosal thickening in the right maxillary sinus. The other visualized paranasal sinuses and mastoid air cells are clear. Hypoplastic right frontal sinus. Other: None. IMPRESSION: No acute intracranial abnormality. Generalized parenchymal volume loss and chronic small-vessel ischemic changes. Electronically Signed   By: Ileana Roup M.D.   On: 07/30/2022 10:14   DG Chest Port 1 View  Result Date: 08/18/2022 CLINICAL DATA:  Shortness of breath.  Respiratory distress. EXAM: PORTABLE CHEST 1 VIEW COMPARISON:  Chest radiograph 12/03/2018 and earlier FINDINGS: Of note, the patient is mildly rotated and tilted on this portable examination. Patchy airspace opacities seen throughout the right lung as well as in the left lung base. No pneumothorax or large volume pleural effusion. Stable elevation of the right hemidiaphragm. Heart size is difficult to assess due to adjacent consolidations but appears stable. Partially visualized anterior cervical spinal fixation and posterior bilateral pedicle screw and rod fixation of the distal thoracic and lumbar spine. Surgical clips in the right upper abdomen. IMPRESSION: Multiple bilateral patchy airspace opacities, right-greater-than-left, concerning for multifocal pneumonia. Electronically Signed   By: Ileana Roup M.D.   On: 08/03/2022 08:23    Labs: BMET Recent Labs  Lab 08/05/2022 0807 08/21/2022 0814 08/04/2022 0828 08/19/2022 1021 08/20/2022 1406 08/19/2022 1559 07/28/2022 1824 08/06/2022 2329 07/24/22 0128 07/24/22 0438 07/24/22 0542  NA 135   < > 131*   < > 134*  130* 130* 129* 127* 131* 126*  K 5.2*   < > 5.0   < > 5.3* 4.9 4.5 4.5 4.7 5.0 4.8  CL 95*  --  102  --  96*  --   --   --   --  83*  --   CO2 10*  --   --   --  14*  --   --   --   --  16*  --   GLUCOSE 71  --  64*  --  63*  --   --   --   --  217*  --   BUN 96*  --  113*  --  90*  --   --   --   --  85*  --   CREATININE 6.44*  --  6.70*  --  5.80*  --   --   --   --  5.31*  --   CALCIUM 9.4  --   --   --  7.8*  --   --   --   --  7.6*  --    < > = values in this interval not displayed.   CBC Recent Labs  Lab 07/28/2022 0807 08/18/2022 0814 08/05/2022 2329 07/24/22 0128 07/24/22 0438 07/24/22 0542  WBC 19.8*  --   --   --  24.9*  --   NEUTROABS 16.5*  --   --   --   --   --   HGB 13.9   < > 11.6* 10.9* 11.3* 10.9*  HCT 41.4   < > 34.0* 32.0* 33.2* 32.0*  MCV 86.4  --   --   --  84.1  --   PLT 365  --   --   --  295  --    < > = values in this interval not displayed.    Medications:     artificial tears   Both Eyes Q8H   insulin aspart  0-9 Units Subcutaneous Q4H   ipratropium-albuterol  3 mL Nebulization Q6H   pantoprazole (PROTONIX) IV  40 mg Intravenous Q24H   Elmarie Shiley, MD 07/24/2022, 9:03 AM

## 2022-07-24 NOTE — Consult Note (Addendum)
Hospital Consult    Reason for Consult:  right lower extremity ischemia Requesting Physician:  Dr. Tacy Learn MRN #:  [id number]  History of Present Illness: This is a 76 y.o. female with PMH significant for atrial fibrillation on Xarelto, CAD, HTN, HLD, Type II Diabetes mellitus, carotid artery disease, and hypothyroidism who was brought to ED with AMS. Patient is intubated and sedated with no family at beside so history is obtained from chart and RN. Apparently patient has had several days of malaise and cough prior to her AMS. She has been found to have acute hypoxic respiratory failure with multifocal pneumonia and AKI. She presently is intubated on pressor support and IV antibiotics. Her bilateral lower extremities were noted to be cyanotic on presentation to ED. CT Angio Aorto/bifem was obtained to evaluate further her lower extremities with concerns of ischemia. The angio showed normal flow in her aorta/ iliacs and runoff with only mild- moderate stenosis noted at the origin of the left posterior tibial artery otherwise patent three vessel runoff bilaterally. Today it was noted some increased mottling and coolness of all of her extremities with no doppler signal in her right lower extremity. Vascular surgery was consulted for further evaluation with concerns of ischemic right leg.   Past Medical History:  Diagnosis Date   Allergy    seasonal   Anemia, iron deficiency    Antiphospholipid syndrome ([place])    with hypercoagulable state   Asthma    extrinsic; moderate, persistant, nml spirometry 2010, nml CXR 1/08   Atrial fibrillation (HCC)    Bladder troubles    REPORTS INFECTIONS ON OCCASION DUE TO URETHRA MEATUS STRICTURE AT BIRTH    CAD (coronary artery disease)    a. 50% mid LAD, 80% ostial D1 and moderate 80% mid circ by cath 2003. b. nonischemic nuc in 2013.   Cancer ([place])    basal cell removed fr. L arm    Carotid arterial disease ([place])    a. 03/2017 showed 1-39% bilateral internal  carotid stenosis (high end range on left), >50% LECA stenosis, with recommendation for f/u duplex 03/2018.   CHF (congestive heart failure) (HCC)    Clotting disorder (HCC)    Complication of anesthesia    cardiac arrest- in OR, at age 87 (47)y.o. during Scalenotomy in her early 20's   Diabetes [facility])    Drug allergy    heparin/lovenox   DVT (deep venous thrombosis) (HCC)    Erosive gastritis 1994   Family history of adverse reaction to anesthesia    some family members have had trouble waking up   GERD (gastroesophageal reflux disease)    H/O hiatal hernia    Heart murmur    atrial fib, controlled   History of colon polyps    HLD (hyperlipidemia)    HTN (hypertension)    Pt states her high blood pressure readings are due tot he method of measurements[facility] at [employer], manages pt.  LOV 03/2014   Hypothyroidism    Liver spot    cyst- - no biopsy, but told that its benign    Moderate mitral regurgitation    PAF (paroxysmal atrial fibrillation) ([place])    Pulmonary embolism ([place])    related to back surgery with prior coumadin use, now off   PVC's (premature ventricular contractions)    a. noted in 09/2017.   Septic shock (HCC)    Spondylosis, lumbosacral    ARTHRITIS- OA    Urinary incontinence     Past Surgical History:  Procedure  Laterality Date   ABDOMINAL HYSTERECTOMY     partial abdominal -1998   BACK SURGERY     x13 cervical and lumbar thoracic spine surgery   BIOPSY  01/26/2021   Procedure: BIOPSY;  Surgeon: Lavena Bullion, DO;  Location: [facility];  Service: Gastroenterology;;   BREAST ENHANCEMENT SURGERY     BREAST SURGERY     all benign cysts x3    CARDIAC CATHETERIZATION     2005   CHOLECYSTECTOMY N/A 10/04/2016   Procedure: LAPAROSCOPIC CHOLECYSTECTOMY;  Surgeon: Erroll Luna, MD;  Location: [place];  Service: General;  Laterality: N/A;   cleft lip and palate repair     76 yo    COLONOSCOPY  2012   COLONOSCOPY WITH PROPOFOL N/A 01/26/2021   Procedure:  COLONOSCOPY WITH PROPOFOL;  Surgeon: Lavena Bullion, DO;  [facility]: [facility];  Service: Gastroenterology;  Laterality: N/A;   ESOPHAGOGASTRODUODENOSCOPY (EGD) WITH PROPOFOL N/A 01/26/2021   Procedure: ESOPHAGOGASTRODUODENOSCOPY (EGD) WITH PROPOFOL;  Surgeon: Lavena Bullion, DO;  [facility];  Service: Gastroenterology;  Laterality: N/A;   hysterectomy     IR GENERIC HISTORICAL  06/04/2016   IR PERC CHOLECYSTOSTOMY 06/04/2016 Greggory Keen, MD [facility]   IR GENERIC HISTORICAL  08/06/2016   IR CHOLANGIOGRAM EXISTING TUBE 08/06/2016 Aletta Edouard, MD [facility]   IR GENERIC HISTORICAL  07/17/2016   IR RADIOLOGIST EVAL & MGMT 07/17/2016 [facility]   JOINT REPLACEMENT     MOHS SURGERY  05/22/2020   right side forehead   POLYPECTOMY  01/26/2021   Procedure: POLYPECTOMY;  Surgeon: Lavena Bullion, DO;  [facility];  Service: Gastroenterology;;   POSTERIOR CERVICAL FUSION/FORAMINOTOMY Right 08/31/2014   Procedure: Right cervical six-seven foraminotomy, Cervical five-Thoracic one fusion, Cervical six-seven lateral mass screws;  Surgeon: Erline Levine, MD;  Location: [facility];  Service: Neurosurgery;  Laterality: Right;   spina bifida repair     76 yo    TONSILLECTOMY     TOTAL KNEE ARTHROPLASTY     left    Allergies  Allergen Reactions   Chlorhexidine Anaphylaxis, Itching and Other (See Comments)    Sensitive to dye in Betadine & Chlorohexadine    Enoxaparin Sodium Other (See Comments)    Hx anaphylaxis to pork   Heparin Other (See Comments)    Hx anaphylaxis to pork   Hornet Venom Anaphylaxis and Other (See Comments)    European Hornets   Pork-Derived Products Anaphylaxis   Povidone-Iodine Itching, Rash and Other (See Comments)    NOTE: Patient has had anaphylactic reaction to Betadine due to dyes in product. Sensitivity-" but if its wiped off she is able to tolerate betadine"    Indomethacin Rash   Lactose Intolerance (Gi) Diarrhea,  Nausea And Vomiting and Other (See Comments)    Can tolerate most [place] cheeses (Takes Lactaid)   Phenylpropanolamine Hypertension    Prior to Admission medications   Medication Sig Start Date End Date Taking? Authorizing Provider  acetaminophen (TYLENOL) 500 MG tablet Take 1,000 mg by mouth every 6 (six) hours as needed for headache, mild pain or moderate pain.   Yes [provider]  Azelastine HCl 137 MCG/SPRAY SOLN USE 1 TO 2 SPRAYS IN EACH NOSTRIL TWICE A DAY. Patient taking differently: Place 1-2 sprays into both nostrils 2 (two) times daily as needed (allergies). 02/28/22  Yes Copland, Gay Filler, MD  Calcium Carb-Cholecalciferol 500-400 MG-UNIT TABS Take 1 tablet by mouth 2 (two) times daily.   Yes [provider]  CLARITIN-D 12 HOUR 5-120 MG tablet TAKE 1 TABLET EVERY TWELVE HOURS AS NEEDED. Patient taking differently: Take 1 tablet by mouth 2 (two) times daily as needed for allergies. 09/01/21  Yes Copland, Gay Filler, MD  cyclobenzaprine (FLEXERIL) 5 MG tablet Take 5 mg by mouth 3 (three) times daily as needed for muscle spasms. 12/13/20  Yes [provider]  docusate sodium (COLACE) 100 MG capsule Take 200 mg by mouth 2 (two) times daily.   Yes [provider]  esomeprazole (NEXIUM) 40 MG capsule Take 1 capsule (40 mg total) by mouth 2 (two) times daily before a meal. 01/29/22 01/29/23 Yes Cirigliano, Vito V, DO  furosemide (LASIX) 40 MG tablet TAKE 1 TABLET ONCE DAILY. Patient taking differently: Take 40 mg by mouth daily. 01/05/22  Yes Burnell Blanks, MD  gabapentin (NEURONTIN) 600 MG tablet Take 600-750 mg by mouth at bedtime.  08/03/16  Yes [provider]  Glucosamine-Chondroitin 750-600 MG TABS Take 2 tablets by mouth 2 (two) times daily.    Yes [provider]  HYDROmorphone (DILAUDID) 4 MG tablet Take 4 mg by mouth every 4 (four) hours as needed (pain). 02/20/18  Yes [provider]  levothyroxine (SYNTHROID) 100  MCG tablet Take 1 tablet (100 mcg total) by mouth daily before breakfast. 03/30/22  Yes Copland, Gay Filler, MD  losartan (COZAAR) 25 MG tablet Take 0.5 tablets (12.5 mg total) by mouth daily. 10/25/21  Yes Copland, Gay Filler, MD  Melatonin 5 MG TABS Take 2.5-5 mg by mouth at bedtime as needed (Sleep).   Yes [provider]  metFORMIN (GLUCOPHAGE-XR) 500 MG 24 hr tablet Take 2 tablets (1,000 mg total) by mouth 2 (two) times daily. 05/07/22  Yes Copland, Gay Filler, MD  metoprolol tartrate (LOPRESSOR) 100 MG tablet TAKE 1 TABLET BY MOUTH TWICE DAILY. Patient taking differently: Take 100 mg by mouth 2 (two) times daily. 12/15/21  Yes Burnell Blanks, MD  metroNIDAZOLE (METROGEL) 1 % gel Apply topically daily. Use as needed for rash on your chest 04/16/22  Yes Copland, Gay Filler, MD  montelukast (SINGULAIR) 10 MG tablet TAKE ONE TABLET BY MOUTH AT BEDTIME. Patient taking differently: Take 10 mg by mouth at bedtime. 03/23/22  Yes Copland, Gay Filler, MD  Multiple Vitamin (MULTIVITAMIN WITH MINERALS) TABS Take 1 tablet by mouth daily.   Yes [provider]  nystatin (MYCOSTATIN/NYSTOP) powder Apply 1 Application topically 3 (three) times daily as needed for irritation (rash). 06/29/22  Yes [provider]  nystatin cream (MYCOSTATIN) Apply 1 Application topically 2 (two) times daily. Use as needed for rash 06/05/22  Yes Copland, Gay Filler, MD  potassium chloride (KLOR-CON) 10 MEQ tablet TAKE 4 TABLETS TWICE DAILY. Patient taking differently: Take 40 mEq by mouth 2 (two) times daily. 10/13/21  Yes Burnell Blanks, MD  rosuvastatin (CRESTOR) 20 MG tablet TAKE ONE TABLET ONCE DAILY Patient taking differently: Take 20 mg by mouth daily. 03/16/22  Yes Burnell Blanks, MD  XARELTO 20 MG TABS tablet Take 1 tablet (20 mg total) by mouth daily with supper. 04/27/22  Yes Burnell Blanks, MD  EPINEPHrine 0.3 mg/0.3 mL IJ SOAJ injection INJECT INTO THE MIDDLE OF THE OUTER  THIGH AND HOLD FOR 3 SECONDS AS NEEDED FOR SEVERE ALLERGIC REACTION THEN CALL 911 IF USED. Patient taking differently: Inject 0.3 mg into the muscle as needed for anaphylaxis. 02/27/22   Copland, Gay Filler, MD  FLOVENT HFA 110 MCG/ACT inhaler USE 1-2 PUFFS TWICE  DAILY. Patient not taking: Reported on 08/07/2022 03/30/22   Copland, Gay Filler, MD  nitroGLYCERIN (NITROSTAT) 0.4 MG SL tablet DISSOLVE 1 TABLET UNDER TONGUE AS NEEDED FOR CHEST PAIN,MAY REPEAT IN 5 MINUTES FOR 2 DOSES. Patient taking differently: Place 0.4 mg under the tongue every 5 (five) minutes as needed for chest pain. 02/23/22   Burnell Blanks, MD  terbinafine (LAMISIL) 250 MG tablet Take 1 tablet (250 mg total) by mouth daily. Patient not taking: Reported on 08/02/2022 06/05/22   Copland, Gay Filler, MD    Social History   Socioeconomic History   Marital status: Married    Spouse name: Not on file   Number of children: Not on file   Years of education: Not on file   Highest education level: Not on file  Occupational History   Not on file  Tobacco Use   Smoking status: Never   Smokeless tobacco: Never   Tobacco comments:    no smoking   Vaping Use   Vaping Use: Never used  Substance and Sexual Activity   Alcohol use: No   Drug use: No   Sexual activity: Not Currently  Other Topics Concern   Not on file  Social History Narrative   Lives in [place] with husband; has had miscarriages but no children.    Disabled but exercises nearly every day (can only exericse in water - aerobics)   Takes opioids for pain relief; takes no herbal medications; has a heart healthy diet.    Social Determinants of Health   Financial Resource Strain: Low Risk  (08/28/2021)   Overall Financial Resource Strain (CARDIA)    Difficulty of Paying Living Expenses: Not hard at all  Food Insecurity: No Food Insecurity (08/28/2021)   Hunger Vital Sign    Worried About Running Out of Food in the Last Year: Never true    Ran Out of Food in the Last  Year: Never true  Transportation Needs: No Transportation Needs (08/28/2021)   PRAPARE - Hydrologist (Medical): No    Lack of Transportation (Non-Medical): No  Physical Activity: Inactive (08/28/2021)   Exercise Vital Sign    Days of Exercise per Week: 0 days    Minutes of Exercise per Session: 0 min  Stress: No Stress Concern Present (08/28/2021)   [place]    Feeling of Stress : Not at all  Social Connections: Not on file  Intimate Partner Violence: Not At Risk (08/28/2021)   Humiliation, Afraid, Rape, and Kick questionnaire    Fear of Current or Ex-Partner: No    Emotionally Abused: No    Physically Abused: No    Sexually Abused: No     Family History  Problem Relation Age of Onset   Heart disease Father        MI    Heart disease Sister    Colon cancer Sister    Colon cancer Cousin        a guy-on father side    Colon cancer Paternal Aunt        great aunt    Colon polyps Neg Hx    Esophageal cancer Neg Hx    Rectal cancer Neg Hx    Stomach cancer Neg Hx     ROS: Otherwise negative unless mentioned in HPI  Physical Examination  Vitals:   07/24/22 0815 07/24/22 0831  BP:    Pulse: 94   Resp: (!) 32  Temp: 99.7 F (37.6 C)   SpO2: 94% 96%   Body mass index is 37.06 kg/m.  General: intubated and sedated, ill appearing Gait: Not observed HENT: WNL, normocephalic Pulmonary: intubated Cardiac: irregular Abdomen: obese,  soft, ND Vascular Exam/Pulses: 2+ femoral pulses, no distal pulses palpable. Doppler right PT and peroneal signals. Left AT, PT and Peroneal doppler signals; bilateral lower extremities from knees down are mottled but warm. Right foot cool. Chronic venous stasis changes appreciable with edema   Musculoskeletal: no muscle wasting or atrophy  Neurologic: sedated   CBC    Component Value Date/Time   WBC 24.9 (H) 07/24/2022 0438   RBC 3.95  07/24/2022 0438   HGB 10.9 (L) 07/24/2022 0542   HGB 14.7 09/23/2017 1648   HCT 32.0 (L) 07/24/2022 0542   HCT 43.1 09/23/2017 1648   PLT 295 07/24/2022 0438   PLT 249 09/23/2017 1648   MCV 84.1 07/24/2022 0438   MCV 87 09/23/2017 1648   MCH 28.6 07/24/2022 0438   MCHC 34.0 07/24/2022 0438   RDW 14.9 07/24/2022 0438   RDW 13.7 09/23/2017 1648   LYMPHSABS 2.0 07/22/2022 0807   MONOABS 0.9 08/06/2022 0807   EOSABS 0.0 08/16/2022 0807   BASOSABS 0.1 07/24/2022 0807    BMET    Component Value Date/Time   NA 126 (L) 07/24/2022 0542   NA 143 04/07/2018 1242   K 4.8 07/24/2022 0542   CL 83 (L) 07/24/2022 0438   CO2 16 (L) 07/24/2022 0438   GLUCOSE 217 (H) 07/24/2022 0438   BUN 85 (H) 07/24/2022 0438   BUN 24 04/07/2018 1242   CREATININE 5.31 (H) 07/24/2022 0438   CREATININE 1.05 (H) 12/05/2018 1534   CALCIUM 7.6 (L) 07/24/2022 0438   GFRNONAA 8 (L) 07/24/2022 0438   GFRAA 83 04/07/2018 1242    COAGS: Lab Results  Component Value Date   INR 1.02 10/04/2016   INR 1.58 06/04/2016   INR 0.88 02/12/2013     Non-Invasive Vascular Imaging:   CTA Aortobifemoral w and/ or wo contrast: IMPRESSION: 1. Mild to moderate stenosis at the origin of the left posterior tibial artery. Otherwise, no flow-limiting stenosis of the lower extremity arteries. Three-vessel runoff to the ankles bilaterally. 2. No significant abnormality of the abdominal aorta or mesenteric arteries.   NON-VASCULAR 1. Bilateral lower lobe and right middle lobe airspace opacities, suspicious for multifocal pneumonia. 2. Colonic diverticulosis.  Statin:  Yes.   Beta Blocker:  Yes.   Aspirin:  No. ACEI:  No. ARB:  Yes.   CCB use:  No Other antiplatelets/anticoagulants:  Yes.   Argatroban   ASSESSMENT/PLAN: This is a 76 y.o. female with multiple medical co morbidities admitted with AMS and acute hypoxic respiratory failure in setting of multifocal pneumonia. She is in Septic shock secondary to her pneumonia  and AKI intubated on broad spectrum antibiotics and pressor support. She was noted to have mottled bilateral lower extremities with concerns of ischemic RLE. CTA shows patency of bilateral lower extremities with mild stenosis of proximal left PTA. On examination she has bilateral palpable femoral pulses, doppler PT/Pero signals with absence of Dp on the right. Bilateral legs are very mottle R> L but mostly warm. Right foot is cool. Unable to assess motor and sensation. Her legs are not acutely ischemic. I suspect her mottling is secondary to a combination of hypoxia and also pressor induced. She also has component of chronic venous insufficiency. At this time no indication is indicated. The on call vascular  surgeon Dr. Scot Dock will see the patient this afternoon to provide further management plans   Karoline Caldwell PA-C [facility] [phone] 07/24/2022  10:29 AM  I have interviewed the patient and examined the patient. I agree with the findings by the PA.  We will continue to try to wean pressors.  We can follow peripherally.  Nothing to offer from a vascular standpoint at this point.  Ultimately she could require amputations involving multiple extremities.  Gae Gallop, MD

## 2022-07-25 ENCOUNTER — Inpatient Hospital Stay (HOSPITAL_COMMUNITY): Payer: Medicare Other

## 2022-07-25 DIAGNOSIS — I4891 Unspecified atrial fibrillation: Secondary | ICD-10-CM | POA: Diagnosis not present

## 2022-07-25 DIAGNOSIS — N179 Acute kidney failure, unspecified: Secondary | ICD-10-CM | POA: Diagnosis not present

## 2022-07-25 DIAGNOSIS — J9601 Acute respiratory failure with hypoxia: Secondary | ICD-10-CM | POA: Diagnosis not present

## 2022-07-25 DIAGNOSIS — A419 Sepsis, unspecified organism: Secondary | ICD-10-CM | POA: Diagnosis not present

## 2022-07-25 LAB — GLUCOSE, CAPILLARY
Glucose-Capillary: 215 mg/dL — ABNORMAL HIGH (ref 70–99)
Glucose-Capillary: 206 mg/dL — ABNORMAL HIGH (ref 70–99)
Glucose-Capillary: 194 mg/dL — ABNORMAL HIGH (ref 70–99)
Glucose-Capillary: 97 mg/dL (ref 70–99)
Glucose-Capillary: 117 mg/dL — ABNORMAL HIGH (ref 70–99)
Glucose-Capillary: 66 mg/dL — ABNORMAL LOW (ref 70–99)
Glucose-Capillary: 97 mg/dL (ref 70–99)
Glucose-Capillary: 91 mg/dL (ref 70–99)

## 2022-07-25 LAB — POCT I-STAT 7, (LYTES, BLD GAS, ICA,H+H)
Acid-Base Excess: 4 mmol/L — ABNORMAL HIGH (ref 0.0–2.0)
Acid-Base Excess: 5 mmol/L — ABNORMAL HIGH (ref 0.0–2.0)
Acid-base deficit: 6 mmol/L — ABNORMAL HIGH (ref 0.0–2.0)
Acid-base deficit: 5 mmol/L — ABNORMAL HIGH (ref 0.0–2.0)
Acid-base deficit: 3 mmol/L — ABNORMAL HIGH (ref 0.0–2.0)
Bicarbonate: 17.8 mmol/L — ABNORMAL LOW (ref 20.0–28.0)
Bicarbonate: 19 mmol/L — ABNORMAL LOW (ref 20.0–28.0)
Bicarbonate: 21.2 mmol/L (ref 20.0–28.0)
Bicarbonate: 26.2 mmol/L (ref 20.0–28.0)
Bicarbonate: 28.2 mmol/L — ABNORMAL HIGH (ref 20.0–28.0)
Calcium, Ion: 0.79 mmol/L — CL (ref 1.15–1.40)
Calcium, Ion: 0.75 mmol/L — CL (ref 1.15–1.40)
Calcium, Ion: 0.77 mmol/L — CL (ref 1.15–1.40)
Calcium, Ion: 0.69 mmol/L — CL (ref 1.15–1.40)
Calcium, Ion: 0.75 mmol/L — CL (ref 1.15–1.40)
HCT: 26 % — ABNORMAL LOW (ref 36.0–46.0)
HCT: 25 % — ABNORMAL LOW (ref 36.0–46.0)
HCT: 27 % — ABNORMAL LOW (ref 36.0–46.0)
HCT: 27 % — ABNORMAL LOW (ref 36.0–46.0)
HCT: 25 % — ABNORMAL LOW (ref 36.0–46.0)
Hemoglobin: 8.8 g/dL — ABNORMAL LOW (ref 12.0–15.0)
Hemoglobin: 8.5 g/dL — ABNORMAL LOW (ref 12.0–15.0)
Hemoglobin: 9.2 g/dL — ABNORMAL LOW (ref 12.0–15.0)
Hemoglobin: 9.2 g/dL — ABNORMAL LOW (ref 12.0–15.0)
Hemoglobin: 8.5 g/dL — ABNORMAL LOW (ref 12.0–15.0)
O2 Saturation: 98 %
O2 Saturation: 99 %
O2 Saturation: 97 %
O2 Saturation: 98 %
O2 Saturation: 98 %
Patient temperature: 36.2
Patient temperature: 36.4
Patient temperature: 35.9
Patient temperature: 37.1
Patient temperature: 37.1
Potassium: 4.7 mmol/L (ref 3.5–5.1)
Potassium: 4.5 mmol/L (ref 3.5–5.1)
Potassium: 4.3 mmol/L (ref 3.5–5.1)
Potassium: 4.7 mmol/L (ref 3.5–5.1)
Potassium: 4.6 mmol/L (ref 3.5–5.1)
Sodium: 122 mmol/L — ABNORMAL LOW (ref 135–145)
Sodium: 122 mmol/L — ABNORMAL LOW (ref 135–145)
Sodium: 122 mmol/L — ABNORMAL LOW (ref 135–145)
Sodium: 120 mmol/L — ABNORMAL LOW (ref 135–145)
Sodium: 121 mmol/L — ABNORMAL LOW (ref 135–145)
TCO2: 19 mmol/L — ABNORMAL LOW (ref 22–32)
TCO2: 20 mmol/L — ABNORMAL LOW (ref 22–32)
TCO2: 22 mmol/L (ref 22–32)
TCO2: 27 mmol/L (ref 22–32)
TCO2: 29 mmol/L (ref 22–32)
pCO2 arterial: 28.4 mmHg — ABNORMAL LOW (ref 32–48)
pCO2 arterial: 30.6 mmHg — ABNORMAL LOW (ref 32–48)
pCO2 arterial: 33.4 mmHg (ref 32–48)
pCO2 arterial: 29.9 mmHg — ABNORMAL LOW (ref 32–48)
pCO2 arterial: 36.1 mmHg (ref 32–48)
pH, Arterial: 7.402 (ref 7.35–7.45)
pH, Arterial: 7.4 (ref 7.35–7.45)
pH, Arterial: 7.406 (ref 7.35–7.45)
pH, Arterial: 7.551 — ABNORMAL HIGH (ref 7.35–7.45)
pH, Arterial: 7.5 — ABNORMAL HIGH (ref 7.35–7.45)
pO2, Arterial: 97 mmHg (ref 83–108)
pO2, Arterial: 153 mmHg — ABNORMAL HIGH (ref 83–108)
pO2, Arterial: 83 mmHg (ref 83–108)
pO2, Arterial: 92 mmHg (ref 83–108)
pO2, Arterial: 91 mmHg (ref 83–108)

## 2022-07-25 LAB — COMPREHENSIVE METABOLIC PANEL
ALT: 83 U/L — ABNORMAL HIGH (ref 0–44)
AST: 173 U/L — ABNORMAL HIGH (ref 15–41)
Albumin: 1.7 g/dL — ABNORMAL LOW (ref 3.5–5.0)
Alkaline Phosphatase: 168 U/L — ABNORMAL HIGH (ref 38–126)
Anion gap: 39 — ABNORMAL HIGH (ref 5–15)
BUN: 80 mg/dL — ABNORMAL HIGH (ref 8–23)
CO2: 16 mmol/L — ABNORMAL LOW (ref 22–32)
Calcium: 7.2 mg/dL — ABNORMAL LOW (ref 8.9–10.3)
Chloride: 71 mmol/L — ABNORMAL LOW (ref 98–111)
Creatinine, Ser: 5.09 mg/dL — ABNORMAL HIGH (ref 0.44–1.00)
GFR, Estimated: 8 mL/min — ABNORMAL LOW (ref >60)
Glucose, Bld: 240 mg/dL — ABNORMAL HIGH (ref 70–99)
Potassium: 4.9 mmol/L (ref 3.5–5.1)
Sodium: 126 mmol/L — ABNORMAL LOW (ref 135–145)
Total Bilirubin: 0.6 mg/dL (ref 0.3–1.2)
Total Protein: 4.2 g/dL — ABNORMAL LOW (ref 6.5–8.1)

## 2022-07-25 LAB — LACTIC ACID, PLASMA
Lactic Acid, Venous: >9 mmol/L (ref 0.5–1.9)
Lactic Acid, Venous: >9 mmol/L (ref 0.5–1.9)

## 2022-07-25 LAB — APTT
aPTT: 105 seconds — ABNORMAL HIGH (ref 24–36)
aPTT: 103 seconds — ABNORMAL HIGH (ref 24–36)
aPTT: 88 seconds — ABNORMAL HIGH (ref 24–36)
aPTT: 88 seconds — ABNORMAL HIGH (ref 24–36)

## 2022-07-25 LAB — CBC
HCT: 25.4 % — ABNORMAL LOW (ref 36.0–46.0)
Hemoglobin: 9.1 g/dL — ABNORMAL LOW (ref 12.0–15.0)
MCH: 29.8 pg (ref 26.0–34.0)
MCHC: 35.8 g/dL (ref 30.0–36.0)
MCV: 83.3 fL (ref 80.0–100.0)
Platelets: 186 10*3/uL (ref 150–400)
RBC: 3.05 MIL/uL — ABNORMAL LOW (ref 3.87–5.11)
RDW: 15.1 % (ref 11.5–15.5)
WBC: 23.9 10*3/uL — ABNORMAL HIGH (ref 4.0–10.5)
nRBC: 0.1 % (ref 0.0–0.2)

## 2022-07-25 MED ORDER — INSULIN ASPART 100 UNIT/ML IJ SOLN
0.0000 [IU] | INTRAMUSCULAR | Status: DC
  Administered 2022-07-25: 4 [IU] via SUBCUTANEOUS

## 2022-07-25 MED ORDER — ARGATROBAN 50 MG/50ML IV SOLN
0.2000 ug/kg/min | INTRAVENOUS | Status: DC
  Administered 2022-07-25: 0.2 ug/kg/min via INTRAVENOUS
  Filled 2022-07-25 (×2): qty 50

## 2022-07-25 MED ORDER — INSULIN GLARGINE-YFGN 100 UNIT/ML ~~LOC~~ SOLN
10.0000 [IU] | Freq: Every day | SUBCUTANEOUS | Status: DC
  Administered 2022-07-25: 10 [IU] via SUBCUTANEOUS
  Filled 2022-07-25 (×2): qty 0.1

## 2022-07-25 MED ORDER — FUROSEMIDE 10 MG/ML IJ SOLN
120.0000 mg | Freq: Once | INTRAVENOUS | Status: AC
  Administered 2022-07-25: 120 mg via INTRAVENOUS
  Filled 2022-07-25: qty 12

## 2022-07-25 MED ORDER — SODIUM CHLORIDE 0.9% FLUSH
10.0000 mL | Freq: Two times a day (BID) | INTRAVENOUS | Status: DC
  Administered 2022-07-25: 10 mL

## 2022-07-25 MED ORDER — SODIUM CHLORIDE 0.9 % IV SOLN
2.0000 g | INTRAVENOUS | Status: DC
  Administered 2022-07-25 – 2022-07-26 (×2): 2 g via INTRAVENOUS
  Filled 2022-07-25 (×2): qty 20

## 2022-07-25 MED ORDER — SODIUM CHLORIDE 0.9% FLUSH: 10.0000 mL | INTRAVENOUS | Status: DC | PRN

## 2022-07-25 MED ORDER — THIAMINE HCL 100 MG/ML IJ SOLN
500.0000 mg | Freq: Every day | INTRAVENOUS | Status: DC
  Administered 2022-07-25 – 2022-07-26 (×2): 500 mg via INTRAVENOUS
  Filled 2022-07-25 (×2): qty 5

## 2022-07-25 MED ORDER — STERILE WATER FOR INJECTION IV SOLN
INTRAVENOUS | Status: DC
  Filled 2022-07-25 (×5): qty 1000

## 2022-07-25 MED ORDER — DEXTROSE 50 % IV SOLN
12.5000 g | INTRAVENOUS | Status: AC
  Administered 2022-07-25: 12.5 g via INTRAVENOUS
  Filled 2022-07-25: qty 50

## 2022-07-25 MED ORDER — ARGATROBAN 50 MG/50ML IV SOLN
0.0980 ug/kg/min | INTRAVENOUS | Status: DC
  Administered 2022-07-25 (×2): 0.14 ug/kg/min via INTRAVENOUS
  Filled 2022-07-25: qty 50

## 2022-07-25 MED ORDER — CALCIUM GLUCONATE-NACL 2-0.675 GM/100ML-% IV SOLN
2.0000 g | Freq: Once | INTRAVENOUS | Status: AC
  Administered 2022-07-25: 2000 mg via INTRAVENOUS
  Filled 2022-07-25: qty 100

## 2022-07-25 MED ORDER — DEXTROSE 10 % IV SOLN: INTRAVENOUS | Status: DC

## 2022-07-25 MED ORDER — CLINDAMYCIN PHOSPHATE 600 MG/50ML IV SOLN
600.0000 mg | Freq: Three times a day (TID) | INTRAVENOUS | Status: DC
  Administered 2022-07-25 – 2022-07-26 (×4): 600 mg via INTRAVENOUS
  Filled 2022-07-25 (×5): qty 50

## 2022-07-25 NOTE — Progress Notes (Signed)
[place] Progress Note Patient Name: Avrielle Fry [facility] DOB: 1946-08-18 MRN: [id number]   Date of Service  07/25/2022  HPI/Events of Note  Ca ++ 0.75  eICU Interventions  Calcium gluconate 2 gn iv bolus x 1 ordered.        Kerry Kass Marypat Kimmet 07/25/2022, 6:38 AM

## 2022-07-25 NOTE — Progress Notes (Addendum)
[facility] Progress Note Patient Name: Latoya [name] [facility] DOB: Jan 19, 1946 MRN: [id number]   Date of Service  07/25/2022  HPI/Events of Note  ABG reviewed. Blood sugar 66 mg / dl.  eICU Interventions  Calcium gluconate 2 gm iv x 1, ventilator set rate reduced to 28, VBG at 9:30 pm. D 10 % water gtt at 30 ml / hour while NPO.        Kerry Kass Milfred Krammes 07/25/2022, 8:22 PM

## 2022-07-25 NOTE — Progress Notes (Signed)
[place] for IV argatroban Indication: atrial fibrillation, hx DVT, antiphospholipid syndrome  Allergies  Allergen Reactions   Chlorhexidine Anaphylaxis, Itching and Other (See Comments)    Sensitive to dye in Betadine & Chlorohexadine    Enoxaparin Sodium Other (See Comments)    Hx anaphylaxis to pork   Heparin Other (See Comments)    Hx anaphylaxis to pork   Hornet Venom Anaphylaxis and Other (See Comments)    European Hornets   Pork-Derived Products Anaphylaxis   Povidone-Iodine Itching, Rash and Other (See Comments)    NOTE: Patient has had anaphylactic reaction to Betadine due to dyes in product. Sensitivity-" but if its wiped off she is able to tolerate betadine"    Indomethacin Rash   Lactose Intolerance (Gi) Diarrhea, Nausea And Vomiting and Other (See Comments)    Can tolerate most [place] cheeses (Takes Lactaid)   Phenylpropanolamine Hypertension    Patient Measurements: Height: '5\' 2"'$  (157.5 cm) Weight: 91.9 kg (202 lb 9.6 oz) IBW/kg (Calculated) : 50.1 Heparin Dosing Weight: n/a  Vital Signs: Temp: 96.6 F (35.9 C) (10/04 1133) BP: 100/57 (10/04 1115) Pulse Rate: 109 (10/04 1130)  Labs: Recent Labs    08/20/2022 0807 08/03/2022 0814 08/06/2022 1034 08/18/2022 1406 08/05/2022 1559 07/24/22 0438 07/24/22 0542 07/24/22 2149 07/24/22 2151 07/25/22 0422 07/25/22 0523 07/25/22 0942 07/25/22 1049  HGB 13.9   < >  --   --    < > 11.3*   < >  --    < > 9.1* 8.5*  --  9.2*  HCT 41.4   < >  --   --    < > 33.2*   < >  --    < > 25.4* 25.0*  --  27.0*  PLT 365  --   --   --   --  295  --   --   --  186  --   --   --   APTT  --   --   --   --    < > 79*   < > >200*  --  105*  --  103*  --   CREATININE 6.44*   < >  --  5.80*  --  5.31*  --   --   --  5.09*  --   --   --   TROPONINIHS 31*  --  37*  --   --   --   --   --   --   --   --   --   --    < > = values in this interval not displayed.     Estimated Creatinine Clearance:  10.1 mL/min (A) (by C-G formula based on SCr of 5.09 mg/dL (H)).   Assessment: 76 yo female on chronic Xarelto for afib, hx DVT.  Pharmacy asked to begin anticoagulation with argatroban while critically ill.  Avoiding heparin and lovenox since they are pork-derived products and patient reports a history of anaphylaxis to pork.  Last Xarelto dose reported to be 9/30.  aPTT elevated at 103 sec on argatroban at 0.14 mcg/kg/min.  No issues with infusion or bleeding per RN. CBC stable  Goal of Therapy:  aPTT 50-90 seconds Monitor platelets by anticoagulation protocol: Yes   Plan:  Hold infusion for 30 minutes (RN aware) Decrease argatroban 30% to 0.098 mcg/kg/min. Check aPTT in 4 hrs Daily aPTT and CBC while on argatroban.  Cerina Leary D. Mina Marble, PharmD, BCPS, [place] 07/25/2022,  11:37 AM

## 2022-07-25 NOTE — Progress Notes (Signed)
VASCULAR SURGERY:   No significant change to the mottling in the hands and feet.  She still has an anterior tibial signal bilaterally with the Doppler.  Not much to add from a vascular standpoint at this point.  We will need to see how she does clinically and ultimately let the extremities demarcate.  Gae Gallop, MD 7:43 AM

## 2022-07-25 NOTE — Progress Notes (Signed)
NAME:  Latoya [name], MRN:  [id number], DOB:  [birth year], LOS: 2 ADMISSION DATE:  08/18/2022, CONSULTATION DATE:  08/10/2022 REFERRING MD:  Dr. Gilford Raid, CHIEF COMPLAINT:  Hypoxemia   History of Present Illness:  76 y o F presented to ED with altered mental status, hypoxemic to 70% on room air.  Patient was placed on nonrebreather, and then subsequently intubated due to acute hypoxic respiratory failure.  Patient's last known well was Sunday.  Per husband, patient had preceding cough, and decreased energy.  She also reported increased pain in her feet, but at baseline is able to ambulate with a walker.  It is unclear if patient has been adherent to medications prior to presentation in emergency department.  Per husband at bedside, patient is a DNR; however, this is not reciprocated in patient's chart.  In ED:  CT of head demonstrated no acute intracranial abnormality.  CT angiography AO +BLFEM showed mild to moderate stenosis at the origin of the left posterior tibial artery without identified flow-limiting stenosis of any other lower extremity arteries.  There is three-vessel runoff to the ankles bilaterally.  No abnormality of the abdominal aorta and mesenteric arteries.  Bilateral lower lobe and right middle lobe patchy opacities were identified. Chest x-ray with multiple bilateral patchy airspace opacities, right greater than left, concerning for multifocal pneumonia.  Patient placed with left side down, with resulting improved oxygenation.  ABG progressively acidotic, with most recent at 7.065/40.7/72/11 0.5, as such bicarb GTT and 2 ampoules of sodium bicarbonate ordered.  Ceftriaxone and azithromycin administered prior to CCM consult.  Patient has right femoral vein central line, and 1 left UE peripheral IV for access.    Pertinent  Medical History  Hemangioma of liver Hypothyroidism (Synthroid 100 mcg daily) Anemia of chronic disease Hyperlipidemia essential  Hypertension (Lopressor 100 mg  twice daily, Cozaar 0.5 mg daily) CAD in native artery Asthma GERD Peripheral artery disease Cervical pseudoarthrosis History of enterococcal bacteremia History of E. coli bacteremia History of pulmonary embolism (Xarelto 20 mg daily) Paroxysmal atrial fibrillation Mitral vegetation History of DVT Diverticulosis AVM of colon without hemorrhage Chronic back pain   Significant Hospital Events: Including procedures, antibiotic start and stop dates in addition to other pertinent events   Patient presented to ED with acute hypoxic respiratory failure, subsequently intubated, on max FiO2.  PEEP and respiratory rate were increased to augment persistent hypoxemia.  Right femoral central venous catheter placed for medication access.  Interim History / Subjective:  Patient remains on low dose vasopressor support Currently on full mechanical ventilatory support Lactate remain elevated >9   Objective   Blood pressure 104/60, pulse (!) 109, temperature (!) 97.5 F (36.4 C), resp. rate (!) 35, height '5\' 2"'$  (1.575 m), weight 91.9 kg, SpO2 98 %.    Vent Mode: PRVC FiO2 (%):  [50 %-80 %] 50 % Set Rate:  [35 bmp] 35 bmp Vt Set:  [400 mL] 400 mL PEEP:  [12 cmH20-14 cmH20] 12 cmH20 Plateau Pressure:  [27 cmH20-28 cmH20] 28 cmH20   Intake/Output Summary (Last 24 hours) at 07/25/2022 0900 Last data filed at 07/25/2022 4166 Gross per 24 hour  Intake 6522.14 ml  Output 60 ml  Net 6462.14 ml   Filed Weights   08/02/2022 0800 08/13/2022 1205 07/24/22 0458  Weight: 74.8 kg 89 kg 91.9 kg    Examination:  Physical exam: General: Crtitically ill-appearing female, orally intubated HEENT: [place]/AT, eyes anicteric.  ETT and OGT in place Neuro: Sedated, not following commands.  Eyes  are closed.  Pupils 3 mm bilateral reactive to light.  Positive cough and gag Chest: Bilateral rhonchorous breath sounds right more than left, no wheezes or crackles Heart: Irregularly irregular, tachycardic, no murmurs or  gallops Abdomen: Soft, nontender, nondistended, bowel sounds present Skin: Mottled all 4 extremities, right foot is cold   Resolved Hospital Problem list     Assessment & Plan:  Acute hypoxic/hypercapnic respiratory failure  Acute septic encephalopathy Septic shock Right lobe pneumonia Left leg cellulitis lactic acidosis Continue lung protective ventilation Not ready for SBT/SAT Continue titrate FiO2 with O2 sat goal 92% Hypercapnia has cleared We will stop propofol Continue fentanyl with RASS goal 0/-1 Titrate vasopressor with map goal 65 Started on stress dose steroid Change IV antibiotics to ceftriaxone and clindamycin to cover for possible extensive left leg cellulitis and pneumonia Cultures have been negative Lactic acid remain elevated  Will try high-dose thiamine  AKI High anion gap metabolic acidosis Hyponatremia Hypocalcemia Potentially ATN secondary to septic shock Baseline serum creatinine is 0.8 Serum creatinine has trended down likely due to dilution from volume resuscitation Appreciate nephrology follow-up She remains anuric We will try Lasix challenge with 120 mg x 1 Closely monitor electrolytes Continue bicarbonate infusion Strict I's and O's Avoid nephrotoxic medications  Paroxysmal atrial fibrillation with RVR Patient had runs of A-fib with RVR Continue amiodarone infusion She is allergic to heparin, currently on argatroban for stroke prophylaxis  Peripheral vascular disease Possible left leg cellulitis Patient has mottled all 4 extremities, cold right foot Appreciate vascular surgery input, no intervention is required at this time Continue IV antibiotics  History of DVT and PE Patient is on Xarelto at home Currently continue argatroban   Diabetes, type II Patient's hemoglobin A1c 7.8 blood sugars are not well controlled in the setting of bicarbonate infusion and dextrose and stress dose steroid Add Semglee 10 units daily Continue sliding  scale insulin   Best Practice (right click and "Reselect all SmartList Selections" daily)   Diet/type: NPO DVT prophylaxis: other  GI prophylaxis: PPI Lines: Central line, Arterial Line, and yes and it is still needed Foley:  Yes, and it is still needed Code Status:  DNR Last date of multidisciplinary goals of care discussion [10/03 patient's husband was updated at bedside]  Labs   CBC: Recent Labs  Lab 08/21/2022 0807 08/03/2022 0814 07/24/22 0438 07/24/22 0542 07/24/22 1800 07/24/22 2151 07/25/22 0229 07/25/22 0422 07/25/22 0523  WBC 19.8*  --  24.9*  --   --   --   --  23.9*  --   NEUTROABS 16.5*  --   --   --   --   --   --   --   --   HGB 13.9   < > 11.3*   < > 9.5* 9.2* 8.8* 9.1* 8.5*  HCT 41.4   < > 33.2*   < > 28.0* 27.0* 26.0* 25.4* 25.0*  MCV 86.4  --  84.1  --   --   --   --  83.3  --   PLT 365  --  295  --   --   --   --  186  --    < > = values in this interval not displayed.    Basic Metabolic Panel: Recent Labs  Lab 08/10/2022 0807 07/31/2022 4098 08/01/2022 0828 07/31/2022 1021 07/28/2022 1406 08/11/2022 1559 07/24/22 0438 07/24/22 0542 07/24/22 1800 07/24/22 2151 07/25/22 0229 07/25/22 0422 07/25/22 0523  NA 135   < > 131*   < >  134*   < > 131*   < > 124* 123* 122* 126* 122*  K 5.2*   < > 5.0   < > 5.3*   < > 5.0   < > 4.9 4.8 4.7 4.9 4.5  CL 95*  --  102  --  96*  --  83*  --   --   --   --  71*  --   CO2 10*  --   --   --  14*  --  16*  --   --   --   --  16*  --   GLUCOSE 71  --  64*  --  63*  --  217*  --   --   --   --  240*  --   BUN 96*  --  113*  --  90*  --  85*  --   --   --   --  80*  --   CREATININE 6.44*  --  6.70*  --  5.80*  --  5.31*  --   --   --   --  5.09*  --   CALCIUM 9.4  --   --   --  7.8*  --  7.6*  --   --   --   --  7.2*  --    < > = values in this interval not displayed.   GFR: Estimated Creatinine Clearance: 10.1 mL/min (A) (by C-G formula based on SCr of 5.09 mg/dL (H)). Recent Labs  Lab 08/14/2022 0807 08/15/2022 1034  08/14/2022 1406 07/25/2022 1636 07/24/22 0438 07/24/22 1007 07/24/22 2149 07/25/22 0422  PROCALCITON  --   --  44.06  --   --   --   --   --   WBC 19.8*  --   --   --  24.9*  --   --  23.9*  LATICACIDVEN >9.0* 8.0*  --  7.7*  --  >9.0* >9.0*  --     Liver Function Tests: Recent Labs  Lab 08/05/2022 1406 07/25/22 0422  AST 128* 173*  ALT 61* 83*  ALKPHOS 172* 168*  BILITOT 0.7 0.6  PROT 5.1* 4.2*  ALBUMIN 2.0* 1.7*   No results for input(s): "LIPASE", "AMYLASE" in the last 168 hours. No results for input(s): "AMMONIA" in the last 168 hours.  ABG    Component Value Date/Time   PHART 7.400 07/25/2022 0523   PCO2ART 30.6 (L) 07/25/2022 0523   PO2ART 153 (H) 07/25/2022 0523   HCO3 19.0 (L) 07/25/2022 0523   TCO2 20 (L) 07/25/2022 0523   ACIDBASEDEF 5.0 (H) 07/25/2022 0523   O2SAT 99 07/25/2022 0523     Coagulation Profile: No results for input(s): "INR", "PROTIME" in the last 168 hours.  Cardiac Enzymes: No results for input(s): "CKTOTAL", "CKMB", "CKMBINDEX", "TROPONINI" in the last 168 hours.  HbA1C: Hemoglobin A1C  Date/Time Value Ref Range Status  12/21/2015 12:00 AM 5.8  Final  04/22/2013 12:00 AM 6.1  Final   Hgb A1c MFr Bld  Date/Time Value Ref Range Status  04/16/2022 02:39 PM 7.8 (H) 4.6 - 6.5 % Final    Comment:    Glycemic Control Guidelines for People with Diabetes:Non Diabetic:  <6%Goal of Therapy: <7%Additional Action Suggested:  >8%   10/11/2021 02:16 PM 7.2 (H) 4.6 - 6.5 % Final    Comment:    Glycemic Control Guidelines for People with Diabetes:Non Diabetic:  <6%Goal of Therapy: <7%Additional Action Suggested:  >8%  CBG: Recent Labs  Lab 07/24/22 1538 07/24/22 1909 07/24/22 2312 07/25/22 0423 07/25/22 0751  GLUCAP 189* 189* 219* 215* 206*    Total critical care time: 43 minutes  Performed by: [place] care time was exclusive of separately billable procedures and treating other patients.   Critical care was necessary  to treat or prevent imminent or life-threatening deterioration.   Critical care was time spent personally by me on the following activities: development of treatment plan with patient and/or surrogate as well as nursing, discussions with consultants, evaluation of patient's response to treatment, examination of patient, obtaining history from patient or surrogate, ordering and performing treatments and interventions, ordering and review of laboratory studies, ordering and review of radiographic studies, pulse oximetry and re-evaluation of patient's condition.   Jacky Kindle, MD [facility] See Amion for pager If no response to pager, please call [phone] until 7pm After 7pm, Please call E-link [phone]

## 2022-07-25 NOTE — Progress Notes (Addendum)
[place] for IV argatroban Indication: atrial fibrillation, hx DVT, antiphospholipid syndrome  Allergies  Allergen Reactions   Chlorhexidine Anaphylaxis, Itching and Other (See Comments)    Sensitive to dye in Betadine & Chlorohexadine    Enoxaparin Sodium Other (See Comments)    Hx anaphylaxis to pork   Heparin Other (See Comments)    Hx anaphylaxis to pork   Hornet Venom Anaphylaxis and Other (See Comments)    [place] Hornets   Pork-Derived Products Anaphylaxis   Povidone-Iodine Itching, Rash and Other (See Comments)    NOTE: Patient has had anaphylactic reaction to Betadine due to dyes in product. Sensitivity-" but if its wiped off she is able to tolerate betadine"    Indomethacin Rash   Lactose Intolerance (Gi) Diarrhea, Nausea And Vomiting and Other (See Comments)    Can tolerate most [place] cheeses (Takes Lactaid)   Phenylpropanolamine Hypertension    Patient Measurements: Height: '5\' 2"'$  (157.5 cm) Weight: 91.9 kg (202 lb 9.6 oz) IBW/kg (Calculated) : 50.1 Heparin Dosing Weight: n/a  Vital Signs: Temp: 99 F (37.2 C) (10/04 1630) BP: 105/56 (10/04 1630) Pulse Rate: 123 (10/04 1630)  Labs: Recent Labs    08/07/2022 0807 08/19/2022 0814 08/03/2022 1034 07/22/2022 1406 07/24/2022 1559 07/24/22 0438 07/24/22 0542 07/25/22 0422 07/25/22 0523 07/25/22 0942 07/25/22 1049 07/25/22 1533  HGB 13.9   < >  --   --    < > 11.3*   < > 9.1* 8.5*  --  9.2*  --   HCT 41.4   < >  --   --    < > 33.2*   < > 25.4* 25.0*  --  27.0*  --   PLT 365  --   --   --   --  295  --  186  --   --   --   --   APTT  --   --   --   --    < > 79*   < > 105*  --  103*  --  88*  CREATININE 6.44*   < >  --  5.80*  --  5.31*  --  5.09*  --   --   --   --   TROPONINIHS 31*  --  37*  --   --   --   --   --   --   --   --   --    < > = values in this interval not displayed.     Estimated Creatinine Clearance: 10.1 mL/min (A) (by C-G formula based on SCr of 5.09  mg/dL (H)).   Assessment: 75 yo female on chronic Xarelto for afib, hx DVT.  Pharmacy asked to begin anticoagulation with argatroban while critically ill.  Avoiding heparin and lovenox since they are pork-derived products and patient reports a history of anaphylaxis to pork.  Last Xarelto dose reported to be 9/30.  aPTT 88 sec (therapeutic) on argatroban at 0.098 mcg/kg/min.  No bleeding noted.  Goal of Therapy:  aPTT 50-90 seconds Monitor platelets by anticoagulation protocol: Yes   Plan:  Continue argatroban at 0.098 mcg/kg/min. Check aPTT in 4 hrs Daily aPTT and CBC while on argatroban.  Sherlon Handing, PharmD, BCPS Please see amion for complete clinical pharmacist phone list 07/25/2022, 4:37 PM  21:00 pm PTT 88 seconds again, no change in argatroban rate Thank you. Anette Guarneri, PharmD

## 2022-07-25 NOTE — Progress Notes (Signed)
Patient ID: Latoya [name], female   DOB: 1946/08/13, 76 y.o.   MRN: [id number] [facility] Progress Note   Assessment/ Plan:   1.  Acute kidney injury: Possibly hemodynamically mediated acute kidney injury in the setting of volume contraction with ongoing ARB/diuretic use; evolving into ATN.  Anuric and with paradoxic improvement of BUN/creatinine that is likely dilutional.  No acute indications for renal replacement therapy at this time; this has limited role based on her overall prognosis at this time. 2.  Septic shock: Suspected to be secondary to right lower lobe pneumonia (possible aspiration event versus de novo community-acquired pneumonia).  On broad-spectrum antimicrobial coverage and pressor support. 3.  Altered mental status: Secondary to septic shock, monitor with management of underlying process. 4.  Acute hypoxic respiratory failure: Intubated and currently ongoing ventilator support/adjustments per CCM and respiratory service. 5.  Peripheral vascular disease: Significant duskiness/impaired perfusion of lower extremity noted on physical exam with focal finding on CT arteriogram.  Seen by vascular surgery and recommendations made for continued monitoring while awaiting demarcation.  Subjective:   Anuric overnight without acute clinical events.  Seen by vascular surgery with regards to limb mottling.   Objective:   BP 104/60   Pulse (!) 109   Temp (!) 97.5 F (36.4 C)   Resp (!) 35   Ht '5\' 2"'$  (1.575 m)   Wt 91.9 kg   SpO2 98%   BMI 37.06 kg/m   Intake/Output Summary (Last 24 hours) at 07/25/2022 0920 Last data filed at 07/25/2022 7412 Gross per 24 hour  Intake 6522.14 ml  Output 60 ml  Net 6462.14 ml   Weight change:   Physical Exam: Gen: Intubated, sedated, comfortable CVS: Pulse regular rhythm, normal rate, S1 and S2 normal Resp: Anteriorly clear to auscultation, no rales/rhonchi Abd: Soft, obese, nontender Ext: Bilateral lower extremities with  mottling.  Left leg with associated erythema/skin blistering.  Imaging: DG Chest Port 1 View  Result Date: 07/24/2022 CLINICAL DATA:  Ventilator dependent respiratory failure. EXAM: PORTABLE CHEST 1 VIEW COMPARISON:  Portable chest yesterday at 4:52 p.m. FINDINGS: 5:23 a.m. ETT tip is 4 cm from the carina. The patient is rotated somewhat to the right. The cardiac size is normal.  Stable mediastinal configuration. Patchy airspace consolidation throughout the right lung fields, small less confluent airspace disease left infrahilar lower lobe area, are redemonstrated unchanged with left lung otherwise clear. There is a chronically elevated right hemidiaphragm. No pleural collection is seen. Fusion hardware again noted in the cervical and lower thoracic/upper lumbar spine. IMPRESSION: No new abnormality. Right-greater-than-left airspace disease is similar. Overall aeration is unchanged. Electronically Signed   By: Telford Nab M.D.   On: 07/24/2022 07:41   VAS [place] LOWER EXTREMITY VENOUS (DVT)  Result Date: 07/22/2022  Lower Venous DVT Study Patient Name:  Latoya [name]  Date of Exam:   08/16/2022 Medical Rec #: [id number]       Accession #:    [id number] Date of Birth: [birth year]       Patient Gender: F Patient Age:   76 years Exam Location:  [facility] Procedure:      VAS [place] LOWER EXTREMITY VENOUS (DVT) Referring Phys: Kipp Brood --------------------------------------------------------------------------------  Indications: Edema.  Risk Factors: HX of DVT & PE, antiphospholipid syndrome. Limitations: Body habitus, poor ultrasound/tissue interface and line placements to bilateral groin areas with overlying bandages and left side catheter placement to upper thigh. Comparison Study: No previous exams Performing Technologist: Jody Hill RVT, RDMS  Examination Guidelines: A complete evaluation includes B-mode imaging, spectral Doppler, color Doppler, and power Doppler as needed of all accessible portions  of each vessel. Bilateral testing is considered an integral part of a complete examination. Limited examinations for reoccurring indications may be performed as noted. The reflux portion of the exam is performed with the patient in reverse Trendelenburg.  +--------+---------------+---------+-----------+----------+--------------------+ RIGHT   CompressibilityPhasicitySpontaneityPropertiesThrombus Aging       +--------+---------------+---------+-----------+----------+--------------------+ CFV                    Yes      Yes                  patent by                                                                 color/doppler        +--------+---------------+---------+-----------+----------+--------------------+ SFJ                                                  Not visualized       +--------+---------------+---------+-----------+----------+--------------------+ FV Prox Full           Yes      Yes                                       +--------+---------------+---------+-----------+----------+--------------------+ FV Mid  Full           Yes      Yes                                       +--------+---------------+---------+-----------+----------+--------------------+ FV      Full           Yes      Yes                                       Distal                                                                    +--------+---------------+---------+-----------+----------+--------------------+ PFV                                                  Not visualized       +--------+---------------+---------+-----------+----------+--------------------+ POP     Full           Yes      Yes                                       +--------+---------------+---------+-----------+----------+--------------------+  PTV                                                  Not visualized        +--------+---------------+---------+-----------+----------+--------------------+ PERO                                                 Not visualized       +--------+---------------+---------+-----------+----------+--------------------+   +--------+---------------+---------+-----------+----------+--------------------+ LEFT    CompressibilityPhasicitySpontaneityPropertiesThrombus Aging       +--------+---------------+---------+-----------+----------+--------------------+ CFV     Full           Yes      Yes                                       +--------+---------------+---------+-----------+----------+--------------------+ SFJ     Full                                                              +--------+---------------+---------+-----------+----------+--------------------+ FV Prox Full           Yes      Yes                                       +--------+---------------+---------+-----------+----------+--------------------+ FV Mid  Full           Yes      Yes                                       +--------+---------------+---------+-----------+----------+--------------------+ FV      Full           Yes      Yes                                       Distal                                                                    +--------+---------------+---------+-----------+----------+--------------------+ PFV                    Yes      Yes                  patent by  color/doppler        +--------+---------------+---------+-----------+----------+--------------------+ POP     Full           Yes      Yes                                       +--------+---------------+---------+-----------+----------+--------------------+ PTV     Full                                                              +--------+---------------+---------+-----------+----------+--------------------+  PERO    Full                                                              +--------+---------------+---------+-----------+----------+--------------------+     Summary: BILATERAL: -No evidence of popliteal cyst, bilaterally. RIGHT: - There is no evidence of deep vein thrombosis in the lower extremity. However, portions of this examination were limited- see technologist comments above.  LEFT: - There is no evidence of deep vein thrombosis in the lower extremity. However, portions of this examination were limited- see technologist comments above.  *See table(s) above for measurements and observations. Electronically signed by Servando Snare MD on 08/05/2022 at 7:50:54 PM.    Final    DG Chest Portable 1 View  Result Date: 08/12/2022 CLINICAL DATA:  Intubation. EXAM: PORTABLE CHEST 1 VIEW COMPARISON:  Earlier same day chest radiograph at 0816 hours FINDINGS: Of note, patient is mildly rotated and tilted on this portable examination. Endotracheal tube tip projects 3.9 cm above the carina. Unchanged multifocal patchy airspace opacities, right-greater-than-left. No pleural effusion or pneumothorax. Heart is normal in size. Spinal instrumentation in the distal cervical and thoracic spine. IMPRESSION: Endotracheal tube tip projects 3.9 cm above the carina. Unchanged multifocal patchy airspace opacities, right-greater-than-left. Electronically Signed   By: Ileana Roup M.D.   On: 08/04/2022 17:15   CT Angio Aortobifemoral W and/or Wo Contrast  Result Date: 08/09/2022 CLINICAL DATA:  Altered mental status Vomiting Nonpalpable lower extremity pulses Discolored lower extremities below the knee EXAM: CT ANGIOGRAPHY OF ABDOMINAL AORTA WITH ILIOFEMORAL RUNOFF TECHNIQUE: Multidetector CT imaging of the abdomen, pelvis and lower extremities was performed using the standard protocol during bolus administration of intravenous contrast. Multiplanar CT image reconstructions and MIPs were obtained to evaluate the vascular  anatomy. RADIATION DOSE REDUCTION: This exam was performed according to the departmental dose-optimization program which includes automated exposure control, adjustment of the mA and/or kV according to patient size and/or use of iterative reconstruction technique. CONTRAST:  100 mL OMNIPAQUE IOHEXOL 350 MG/ML SOLN COMPARISON:  None available FINDINGS: VASCULAR Aorta: Normal caliber aorta without aneurysm, dissection, vasculitis or significant stenosis. Celiac: Patent without evidence of aneurysm, dissection, vasculitis or significant stenosis. SMA: Patent without evidence of aneurysm, dissection, vasculitis or significant stenosis. Renals: Both renal arteries are patent without evidence of aneurysm, dissection, vasculitis, fibromuscular dysplasia or significant stenosis. IMA: Patent without evidence of aneurysm, dissection, vasculitis or significant stenosis. RIGHT Lower Extremity Inflow: Mild diffuse calcified plaque of the common iliac artery.  Evaluation for stenosis is limited due to streak artifact from spine hardware. Evaluation of the origin of the internal iliac artery is limited due to streak artifact from spine hardware. The internal iliac artery branches are patent. Evaluation of the origin of the external iliac artery is limited due to streak artifact. It is otherwise patent. Outflow: Mild calcified plaque in the common femoral artery without flow-limiting stenosis. Profunda femoris artery is patent without significant stenosis. Minimal focal calcified plaque of the distal superficial femoral artery without flow-limiting stenosis. Popliteal artery is diminutive but patent without significant stenosis. Runoff: Patent three vessel runoff to the ankle. LEFT Lower Extremity Inflow: Common, internal and external iliac arteries are patent without evidence of aneurysm, dissection, vasculitis or significant stenosis. Outflow: No significant stenosis of the common femoral artery. The profundus femoris artery is  patent. There is mild stenosis at the origin due to focal calcified plaque. The superficial femoral artery is patent no significant stenosis. Evaluation of the left popliteal artery is significantly limited due to streak artifact from left total knee prosthesis. The distal popliteal artery is patent. Runoff: Tibioperoneal trunk is mildly narrowed due to calcified plaque. There is mild to moderate stenosis at the origin of the posterior tibial artery due to calcified plaque. There is three-vessel runoff to the left ankle. Veins: Right common femoral vein approach central venous catheter terminates in the common iliac vein. Review of the MIP images confirms the above findings. NON-VASCULAR Lower chest: Airspace opacity within the lower lobes bilaterally may be due to atelectasis or pneumonia. Bronchiectasis also noted in the right middle lobe with adjacent airspace opacity. Hepatobiliary: Visualized portions the liver are unremarkable. Postsurgical changes of cholecystectomy are seen. No bile duct dilatation. Pancreas: Unremarkable. No pancreatic ductal dilatation or surrounding inflammatory changes. Spleen: Normal in size without focal abnormality. Adrenals/Urinary Tract: Adrenal glands are unremarkable, evaluation of the kidneys, particularly the left side is limited due to streak artifact as well as motion. No hydronephrosis or hydroureter. The bladder is collapsed around a Foley catheter balloon which limits evaluation. Stomach/Bowel: Small hiatal hernia. Small duodenal diverticulum. No bowel dilatation to indicate ileus or obstruction. Appendix is not definitively identified, however no right lower quadrant phlegm a tori changes are present. Evaluation of the bowel is limited due to respiratory motion. Scattered colonic diverticula are present without evidence of acute diverticulitis. Lymphatic: No enlarged abdominal or pelvic lymph nodes. Reproductive: Uterus is either atrophic or absent. It is not well  visualized. No adnexal abnormalities. Other: Bilateral breast prostheses are partially visualized. Single surgical clip noted in the left rectus sheath. Musculoskeletal: Long segment spinal fusion changes again seen. Left total knee prosthesis is seen. No acute osseous abnormality. Limitations: Evaluation limited due to respiratory motion and streak artifact. IMPRESSION: 1. Mild to moderate stenosis at the origin of the left posterior tibial artery. Otherwise, no flow-limiting stenosis of the lower extremity arteries. Three-vessel runoff to the ankles bilaterally. 2. No significant abnormality of the abdominal aorta or mesenteric arteries. NON-VASCULAR 1. Bilateral lower lobe and right middle lobe airspace opacities, suspicious for multifocal pneumonia. 2. Colonic diverticulosis. Electronically Signed   By: Miachel Roux M.D.   On: 07/28/2022 10:41   CT Head Wo Contrast  Result Date: 07/22/2022 CLINICAL DATA:  Altered mental status.  Patient found on floor. EXAM: CT HEAD WITHOUT CONTRAST TECHNIQUE: Contiguous axial images were obtained from the base of the skull through the vertex without intravenous contrast. RADIATION DOSE REDUCTION: This exam was performed according to the departmental dose-optimization program  which includes automated exposure control, adjustment of the mA and/or kV according to patient size and/or use of iterative reconstruction technique. COMPARISON:  CT head 09/16/2009 FINDINGS: Of note, streak artifact from dental hardware obscures posterior fossa. Brain: No evidence of acute infarction, hemorrhage, hydrocephalus, extra-axial collection or mass lesion/mass effect. Generalized parenchymal volume loss, within normal limits for patient age. Scattered hypodensities in the periventricular and subcortical white matter, consistent with chronic small-vessel ischemic changes. Vascular: Vascular calcifications at the skull base no hyperdense vessel or unexpected calcification. Skull: Hyperostosis  frontalis interna. Negative for fracture or focal lesion. Sinuses/Orbits: No acute finding. Trace mucosal thickening in the right maxillary sinus. The other visualized paranasal sinuses and mastoid air cells are clear. Hypoplastic right frontal sinus. Other: None. IMPRESSION: No acute intracranial abnormality. Generalized parenchymal volume loss and chronic small-vessel ischemic changes. Electronically Signed   By: Ileana Roup M.D.   On: 08/21/2022 10:14    Labs: [employer] Recent Labs  Lab 08/07/2022 0807 08/20/2022 0814 07/27/2022 0828 07/27/2022 1021 07/29/2022 1406 08/21/2022 1559 07/24/22 0438 07/24/22 0542 07/24/22 1123 07/24/22 1546 07/24/22 1800 07/24/22 2151 07/25/22 0229 07/25/22 0422 07/25/22 0523  NA 135   < > 131*   < > 134*   < > 131*   < > 125* 124* 124* 123* 122* 126* 122*  K 5.2*   < > 5.0   < > 5.3*   < > 5.0   < > 4.8 5.0 4.9 4.8 4.7 4.9 4.5  CL 95*  --  102  --  96*  --  83*  --   --   --   --   --   --  71*  --   CO2 10*  --   --   --  14*  --  16*  --   --   --   --   --   --  16*  --   GLUCOSE 71  --  64*  --  63*  --  217*  --   --   --   --   --   --  240*  --   BUN 96*  --  113*  --  90*  --  85*  --   --   --   --   --   --  80*  --   CREATININE 6.44*  --  6.70*  --  5.80*  --  5.31*  --   --   --   --   --   --  5.09*  --   CALCIUM 9.4  --   --   --  7.8*  --  7.6*  --   --   --   --   --   --  7.2*  --    < > = values in this interval not displayed.   CBC Recent Labs  Lab 08/01/2022 0807 08/08/2022 0814 07/24/22 0438 07/24/22 0542 07/24/22 2151 07/25/22 0229 07/25/22 0422 07/25/22 0523  WBC 19.8*  --  24.9*  --   --   --  23.9*  --   NEUTROABS 16.5*  --   --   --   --   --   --   --   HGB 13.9   < > 11.3*   < > 9.2* 8.8* 9.1* 8.5*  HCT 41.4   < > 33.2*   < > 27.0* 26.0* 25.4* 25.0*  MCV 86.4  --  84.1  --   --   --  83.3  --   PLT 365  --  295  --   --   --  186  --    < > = values in this interval not displayed.    Medications:     artificial tears   Both  Eyes Q8H   hydrocortisone sod succinate (SOLU-CORTEF) inj  100 mg Intravenous Q12H   insulin aspart  0-20 Units Subcutaneous Q4H   insulin glargine-yfgn  10 Units Subcutaneous Daily   ipratropium-albuterol  3 mL Nebulization Q6H   pantoprazole (PROTONIX) IV  40 mg Intravenous Q24H   sodium chloride flush  10-40 mL Intracatheter Q12H   Elmarie Shiley, MD 07/25/2022, 9:20 AM

## 2022-07-25 NOTE — Progress Notes (Signed)
[place] for IV argatroban Indication: atrial fibrillation, hx DVT, antiphospholipid syndrome  Allergies  Allergen Reactions   Chlorhexidine Anaphylaxis, Itching and Other (See Comments)    Sensitive to dye in Betadine & Chlorohexadine    Enoxaparin Sodium Other (See Comments)    Hx anaphylaxis to pork   Heparin Other (See Comments)    Hx anaphylaxis to pork   Hornet Venom Anaphylaxis and Other (See Comments)    European Hornets   Pork-Derived Products Anaphylaxis   Povidone-Iodine Itching, Rash and Other (See Comments)    NOTE: Patient has had anaphylactic reaction to Betadine due to dyes in product. Sensitivity-" but if its wiped off she is able to tolerate betadine"    Indomethacin Rash   Lactose Intolerance (Gi) Diarrhea, Nausea And Vomiting and Other (See Comments)    Can tolerate most [place] cheeses (Takes Lactaid)   Phenylpropanolamine Hypertension    Patient Measurements: Height: '5\' 2"'$  (157.5 cm) Weight: 91.9 kg (202 lb 9.6 oz) IBW/kg (Calculated) : 50.1 Heparin Dosing Weight: n/a  Vital Signs: Temp: 97.3 F (36.3 C) (10/04 0400) Temp Source: Bladder (10/03 2000) BP: 108/54 (10/04 0400) Pulse Rate: 99 (10/04 0315)  Labs: Recent Labs    07/24/2022 0807 08/19/2022 0814 08/11/2022 0828 07/28/2022 1021 07/25/2022 1034 08/13/2022 1406 08/06/2022 1559 07/24/22 0438 07/24/22 0542 07/24/22 1701 07/24/22 1800 07/24/22 2149 07/24/22 2151 07/25/22 0229 07/25/22 0422  HGB 13.9   < > 13.6   < >  --   --    < > 11.3*   < >  --    < >  --  9.2* 8.8* 9.1*  HCT 41.4   < > 40.0   < >  --   --    < > 33.2*   < >  --    < >  --  27.0* 26.0* 25.4*  PLT 365  --   --   --   --   --   --  295  --   --   --   --   --   --  186  APTT  --   --   --   --   --   --    < > 79*  --  99*  --  >200*  --   --  105*  CREATININE 6.44*  --  6.70*  --   --  5.80*  --  5.31*  --   --   --   --   --   --   --   TROPONINIHS 31*  --   --   --  37*  --   --   --    --   --   --   --   --   --   --    < > = values in this interval not displayed.     Estimated Creatinine Clearance: 9.7 mL/min (A) (by C-G formula based on SCr of 5.31 mg/dL (H)).   Medical History: Past Medical History:  Diagnosis Date   Allergy    seasonal   Anemia, iron deficiency    Antiphospholipid syndrome ([place])    with hypercoagulable state   Asthma    extrinsic; moderate, persistant, nml spirometry 2010, nml CXR 1/08   Atrial fibrillation (HCC)    Bladder troubles    REPORTS INFECTIONS ON OCCASION DUE TO URETHRA MEATUS STRICTURE AT BIRTH    CAD (coronary artery disease)  a. 50% mid LAD, 80% ostial D1 and moderate 80% mid circ by cath 2003. b. nonischemic nuc in 2013.   Cancer ([place])    basal cell removed fr. L arm    Carotid arterial disease ([place])    a. 03/2017 showed 1-39% bilateral internal carotid stenosis (high end range on left), >50% LECA stenosis, with recommendation for f/u duplex 03/2018.   CHF (congestive heart failure) (HCC)    Clotting disorder (HCC)    Complication of anesthesia    cardiac arrest- in OR, at age 48 (49)y.o. during Scalenotomy in her early 20's   Diabetes [facility])    Drug allergy    heparin/lovenox   DVT (deep venous thrombosis) (HCC)    Erosive gastritis 1994   Family history of adverse reaction to anesthesia    some family members have had trouble waking up   GERD (gastroesophageal reflux disease)    H/O hiatal hernia    Heart murmur    atrial fib, controlled   History of colon polyps    HLD (hyperlipidemia)    HTN (hypertension)    Pt states her high blood pressure readings are due tot he method of measurements[facility]aney at [employer], manages pt.  LOV 03/2014   Hypothyroidism    Liver spot    cyst- - no biopsy, but told that its benign    Moderate mitral regurgitation    PAF (paroxysmal atrial fibrillation) ([place])    Pulmonary embolism ([place])    related to back surgery with prior coumadin use, now off   PVC's (premature ventricular  contractions)    a. noted in 09/2017.   Septic shock (HCC)    Spondylosis, lumbosacral    ARTHRITIS- OA    Urinary incontinence     Medications:  Infusions:   sodium chloride 10 mL/hr at 07/25/22 0200   amiodarone 30 mg/hr (07/25/22 0200)   argatroban 0.2 mcg/kg/min (07/25/22 0200)   azithromycin Stopped (07/24/22 1039)   fentaNYL infusion INTRAVENOUS 150 mcg/hr (07/25/22 0200)   norepinephrine (LEVOPHED) Adult infusion 4 mcg/min (07/25/22 0200)   piperacillin-tazobactam (ZOSYN)  IV Stopped (07/24/22 2151)   propofol (DIPRIVAN) infusion 20 mcg/kg/min (07/25/22 0200)   sodium bicarbonate 150 mEq in dextrose 5 % 1,150 mL infusion 200 mL/hr at 07/25/22 0343   vasopressin 0.03 Units/min (07/25/22 0200)    Assessment: 76 yo female on chronic Xarelto for afib, hx DVT.  Pharmacy asked to begin anticoagulation with argatroban while critically ill.  Avoiding heparin and lovenox since they are pork-derived products and patient reports a history of anaphylaxis to pork.  Last Xarelto dose reported to be 9/30.  aPTT elevated on argatroban at 0.2 mcg/kg/min.  No issues with infusion or bleeding per RN. CBC stable  Goal of Therapy:  aPTT 50-90 seconds Monitor platelets by anticoagulation protocol: Yes   Plan:  Hold infusion for 30 minutes Decrease argatroban 30% to 0.14 mcg/kg/min. Check aPTT in 4 hrs. Daily aPTT and CBC while on argatroban.  Georga Bora, PharmD Clinical Pharmacist 07/25/2022 5:20 AM Please check AMION for all [place] numbers

## 2022-07-25 NOTE — Progress Notes (Signed)
Inpatient Diabetes Program Recommendations  AACE/ADA: New Consensus Statement on Inpatient Glycemic Control (2015)  Target Ranges:  Prepandial:   less than 140 mg/dL      Peak postprandial:   less than 180 mg/dL (1-2 hours)      Critically ill patients:  140 - 180 mg/dL   Lab Results  Component Value Date   GLUCAP 206 (H) 07/25/2022   HGBA1C 7.8 (H) 04/16/2022    Review of Glycemic Control  Latest Reference Range & Units 07/24/22 11:43 07/24/22 15:38 07/24/22 19:09 07/24/22 23:12 07/25/22 04:23 07/25/22 07:51  Glucose-Capillary 70 - 99 mg/dL 177 (H) 189 (H) 189 (H) 219 (H) 215 (H) 206 (H)   Diabetes history: DM  Outpatient Diabetes medications:  Metformin 1000 mg bid Current orders for Inpatient glycemic control:  Novolog 0-20 units q 4 hours Semglee 10 units daily  Inpatient Diabetes Program Recommendations:    Agree with addition of basal insulin.  May need to reduce Novolog correction due to renal function.  Will follow.   Thanks,  Adah Perl, RN, BC-ADM Inpatient Diabetes Coordinator Pager [phone]  (8a-5p)

## 2022-07-25 NOTE — Progress Notes (Addendum)
Initial Nutrition Assessment  DOCUMENTATION CODES:   Not applicable  INTERVENTION:   When clinical status allows, initiate tube feeding via OG tube: Vital 1.5 at 50 ml/h (1200 ml per day) Prosource TF20 60 ml BID  Provides 1960 kcal, 121 gm protein, 917 ml free water daily.  NUTRITION DIAGNOSIS:   Inadequate oral intake related to inability to eat as evidenced by NPO status.  GOAL:   Patient will meet greater than or equal to 90% of their needs  MONITOR:   Vent status, TF tolerance, Labs  REASON FOR ASSESSMENT:   Ventilator    ASSESSMENT:   76 yo female admitted with RLL CAP, ARDS, septic shock. PMH includes hemangioma of liver, hypothyroidism, anemia, HLD, HTN, CAD, CHF, clotting disorder, asthma, GERD, PAD, PAF, mitral vegetation, diverticulosis, AVM of colon, chronic back pain, lactose intolerance, anaphylaxis to pork derived products.  RD discussed patient in ICU rounds and with RN today. OG tube was placed, but had a lot of output when placed, so plans to hold on initiation of tube feeding at this time. Per most recent abdominal film, tip is in the proximal stomach with side port in the distal esophagus. Advancement recommended by radiologist. Nephrology following; no indication for RRT at this time. Vascular Surgery following for mottling of BLE.   Patient is currently intubated on ventilator support MV: 13.7 L/min Temp (24hrs), Avg:98 F (36.7 C), Min:96.6 F (35.9 C), Max:99.3 F (37.4 C)  Propofol: currently off  Labs reviewed. Na 122, BUN 80, creatinine 5.09 CBG: 215-206  Medications reviewed and include Solu-cortef, Novolog, Semglee, thiamine, vasopressin. Levophed and propofol are currently off.   Weight history reviewed. No significant weight changes noted.  Net IO Since Admission: 13,728.7 mL [07/25/22 1348] UOP 10 ml x 24 hours, 75 ml so far today  Per RN documentation, patient has severe edema in all extremities.   NUTRITION - FOCUSED  PHYSICAL EXAM:  Unable to complete  Diet Order:   Diet Order             Diet NPO time specified  Diet effective now                   EDUCATION NEEDS:   No education needs have been identified at this time  Skin:  Skin Assessment: Skin Integrity Issues: Skin Integrity Issues:: Other (Comment) Other: BLE mottled from knee down  Last BM:  no BM documented  Height:   Ht Readings from Last 1 Encounters:  08/01/2022 '5\' 2"'$  (1.575 m)    Weight:   Wt Readings from Last 1 Encounters:  07/24/22 91.9 kg    Ideal Body Weight:  50 kg  BMI:  Body mass index is 37.06 kg/m.  Estimated Nutritional Needs:   Kcal:  8937-3428  Protein:  100-120 gm  Fluid:  1.8-2 L   Lucas Mallow RD, LDN, CNSC Please refer to Amion for contact information.

## 2022-07-26 ENCOUNTER — Inpatient Hospital Stay (HOSPITAL_COMMUNITY): Payer: Medicare Other

## 2022-07-26 DIAGNOSIS — I4891 Unspecified atrial fibrillation: Secondary | ICD-10-CM | POA: Diagnosis not present

## 2022-07-26 DIAGNOSIS — Z7189 Other specified counseling: Secondary | ICD-10-CM | POA: Diagnosis not present

## 2022-07-26 DIAGNOSIS — Z515 Encounter for palliative care: Secondary | ICD-10-CM

## 2022-07-26 DIAGNOSIS — N179 Acute kidney failure, unspecified: Secondary | ICD-10-CM | POA: Diagnosis not present

## 2022-07-26 DIAGNOSIS — Z66 Do not resuscitate: Secondary | ICD-10-CM

## 2022-07-26 DIAGNOSIS — J9601 Acute respiratory failure with hypoxia: Secondary | ICD-10-CM | POA: Diagnosis not present

## 2022-07-26 DIAGNOSIS — A419 Sepsis, unspecified organism: Secondary | ICD-10-CM | POA: Diagnosis not present

## 2022-07-26 LAB — GLUCOSE, CAPILLARY
Glucose-Capillary: 117 mg/dL — ABNORMAL HIGH (ref 70–99)
Glucose-Capillary: 117 mg/dL — ABNORMAL HIGH (ref 70–99)
Glucose-Capillary: 105 mg/dL — ABNORMAL HIGH (ref 70–99)
Glucose-Capillary: 83 mg/dL (ref 70–99)

## 2022-07-26 LAB — COMPREHENSIVE METABOLIC PANEL
ALT: 80 U/L — ABNORMAL HIGH (ref 0–44)
AST: 205 U/L — ABNORMAL HIGH (ref 15–41)
Albumin: 1.6 g/dL — ABNORMAL LOW (ref 3.5–5.0)
Alkaline Phosphatase: 200 U/L — ABNORMAL HIGH (ref 38–126)
BUN: 79 mg/dL — ABNORMAL HIGH (ref 8–23)
CO2: 27 mmol/L (ref 22–32)
Calcium: 6.4 mg/dL — CL (ref 8.9–10.3)
Chloride: <65 mmol/L — CL (ref 98–111)
Creatinine, Ser: 4.69 mg/dL — ABNORMAL HIGH (ref 0.44–1.00)
GFR, Estimated: 9 mL/min — ABNORMAL LOW (ref >60)
Glucose, Bld: 111 mg/dL — ABNORMAL HIGH (ref 70–99)
Potassium: 4.9 mmol/L (ref 3.5–5.1)
Sodium: 126 mmol/L — ABNORMAL LOW (ref 135–145)
Total Bilirubin: 0.9 mg/dL (ref 0.3–1.2)
Total Protein: 4.1 g/dL — ABNORMAL LOW (ref 6.5–8.1)

## 2022-07-26 LAB — CBC
HCT: 24.7 % — ABNORMAL LOW (ref 36.0–46.0)
Hemoglobin: 8.9 g/dL — ABNORMAL LOW (ref 12.0–15.0)
MCH: 28.7 pg (ref 26.0–34.0)
MCHC: 36 g/dL (ref 30.0–36.0)
MCV: 79.7 fL — ABNORMAL LOW (ref 80.0–100.0)
Platelets: 173 10*3/uL (ref 150–400)
RBC: 3.1 MIL/uL — ABNORMAL LOW (ref 3.87–5.11)
RDW: 14.2 % (ref 11.5–15.5)
WBC: 21.3 10*3/uL — ABNORMAL HIGH (ref 4.0–10.5)
nRBC: 0.1 % (ref 0.0–0.2)

## 2022-07-26 LAB — PHOSPHORUS: Phosphorus: 7.4 mg/dL — ABNORMAL HIGH (ref 2.5–4.6)

## 2022-07-26 LAB — MAGNESIUM: Magnesium: 1.5 mg/dL — ABNORMAL LOW (ref 1.7–2.4)

## 2022-07-26 LAB — APTT: aPTT: 86 seconds — ABNORMAL HIGH (ref 24–36)

## 2022-07-26 LAB — POCT I-STAT 7, (LYTES, BLD GAS, ICA,H+H)
Acid-Base Excess: 6 mmol/L — ABNORMAL HIGH (ref 0.0–2.0)
Bicarbonate: 29.5 mmol/L — ABNORMAL HIGH (ref 20.0–28.0)
Calcium, Ion: 0.72 mmol/L — CL (ref 1.15–1.40)
HCT: 25 % — ABNORMAL LOW (ref 36.0–46.0)
Hemoglobin: 8.5 g/dL — ABNORMAL LOW (ref 12.0–15.0)
O2 Saturation: 97 %
Patient temperature: 36.6
Potassium: 4.6 mmol/L (ref 3.5–5.1)
Sodium: 120 mmol/L — ABNORMAL LOW (ref 135–145)
TCO2: 31 mmol/L (ref 22–32)
pCO2 arterial: 37.9 mmHg (ref 32–48)
pH, Arterial: 7.498 — ABNORMAL HIGH (ref 7.35–7.45)
pO2, Arterial: 80 mmHg — ABNORMAL LOW (ref 83–108)

## 2022-07-26 MED ORDER — HALOPERIDOL LACTATE 5 MG/ML IJ SOLN: 2.0000 mg | INTRAMUSCULAR | Status: DC | PRN

## 2022-07-26 MED ORDER — INSULIN ASPART 100 UNIT/ML IJ SOLN: 0.0000 [IU] | INTRAMUSCULAR | Status: DC

## 2022-07-26 MED ORDER — ORAL CARE MOUTH RINSE: 15.0000 mL | OROMUCOSAL | Status: DC | PRN

## 2022-07-26 MED ORDER — CALCIUM GLUCONATE-NACL 2-0.675 GM/100ML-% IV SOLN
2.0000 g | Freq: Once | INTRAVENOUS | Status: AC
  Administered 2022-07-26: 2000 mg via INTRAVENOUS
  Filled 2022-07-26: qty 100

## 2022-07-26 MED ORDER — ORAL CARE MOUTH RINSE
15.0000 mL | OROMUCOSAL | Status: DC
  Administered 2022-07-26 (×7): 15 mL via OROMUCOSAL

## 2022-07-26 MED ORDER — GLYCOPYRROLATE 1 MG PO TABS: 1.0000 mg | ORAL_TABLET | ORAL | Status: DC | PRN

## 2022-07-26 MED ORDER — LORAZEPAM 1 MG PO TABS: 1.0000 mg | ORAL_TABLET | ORAL | Status: DC | PRN

## 2022-07-26 MED ORDER — HALOPERIDOL 1 MG PO TABS
2.0000 mg | ORAL_TABLET | ORAL | Status: DC | PRN
  Filled 2022-07-26: qty 2

## 2022-07-26 MED ORDER — MAGNESIUM SULFATE 2 GM/50ML IV SOLN
2.0000 g | Freq: Once | INTRAVENOUS | Status: AC
  Administered 2022-07-26: 2 g via INTRAVENOUS
  Filled 2022-07-26: qty 50

## 2022-07-26 MED ORDER — DEXTROSE 50 % IV SOLN: 50.0000 mL | INTRAVENOUS | Status: DC | PRN

## 2022-07-26 MED ORDER — GLYCOPYRROLATE 0.2 MG/ML IJ SOLN: 0.2000 mg | INTRAMUSCULAR | Status: DC | PRN

## 2022-07-26 MED ORDER — HALOPERIDOL LACTATE 2 MG/ML PO CONC
2.0000 mg | ORAL | Status: DC | PRN
  Filled 2022-07-26: qty 5

## 2022-07-26 MED ORDER — FUROSEMIDE 10 MG/ML IJ SOLN
160.0000 mg | Freq: Once | INTRAVENOUS | Status: AC
  Administered 2022-07-26: 160 mg via INTRAVENOUS
  Filled 2022-07-26: qty 10

## 2022-07-26 MED ORDER — GLYCOPYRROLATE 0.2 MG/ML IJ SOLN
0.2000 mg | INTRAMUSCULAR | Status: DC | PRN
  Administered 2022-07-26: 0.2 mg via INTRAVENOUS
  Filled 2022-07-26: qty 1

## 2022-07-26 MED ORDER — LORAZEPAM 2 MG/ML IJ SOLN
1.0000 mg | INTRAMUSCULAR | Status: DC | PRN
  Administered 2022-07-26: 1 mg via INTRAVENOUS
  Filled 2022-07-26: qty 1

## 2022-07-26 MED ORDER — HYDROMORPHONE HCL 1 MG/ML IJ SOLN: 1.0000 mg | INTRAMUSCULAR | Status: DC | PRN

## 2022-07-26 MED ORDER — LORAZEPAM 2 MG/ML PO CONC
1.0000 mg | ORAL | Status: DC | PRN
  Filled 2022-07-26: qty 0.5

## 2022-07-27 ENCOUNTER — Telehealth: Payer: Self-pay | Admitting: Family Medicine

## 2022-07-27 NOTE — Telephone Encounter (Signed)
Called and spoke with her husband [name] who is also my patient.  Shared my condolences on the loss of this very sweet lady.  Offered my support in any way possible

## 2022-07-28 LAB — CULTURE, BLOOD (ROUTINE X 2)
Culture: NO GROWTH
Culture: NO GROWTH
Special Requests: ADEQUATE

## 2022-07-28 LAB — CALCIUM, IONIZED: Calcium, Ionized, Serum: <3 mg/dL — ABNORMAL LOW (ref 4.5–5.6)

## 2022-08-22 NOTE — Progress Notes (Signed)
VASCULAR SURGERY:  Her hands are stable.  Her lower extremities are not significantly changed.  She continues to have an anterior tibial signal bilaterally with the Doppler.  On the right she now has a posterior tibial signal which is an improvement.  As I understand that she is DNR.  We will follow peripherally.  Gae Gallop, MD 7:58 AM

## 2022-08-22 NOTE — Procedures (Signed)
Extubation Procedure Note  Patient Details:   Name: Latoya [name] DOB: Dec 25, 1945 MRN: [id number]   Airway Documentation:    Vent end date: 2022/08/13 Vent end time: 1615   Evaluation  O2 sats: currently acceptable Complications: No apparent complications Patient did tolerate procedure well. Bilateral Breath Sounds: Diminished   No  Pt was extubated due to comfort care measures  Felecia Jan 2022/08/13, 4:21 PM

## 2022-08-22 NOTE — Progress Notes (Signed)
100 cc of FENT wasted w/ Lilia Pro, RN

## 2022-08-22 NOTE — Progress Notes (Signed)
Nutrition Follow-up  DOCUMENTATION CODES:   Not applicable  INTERVENTION:   Once small-bore NG tube is placed by radiology on 10/06, initiate trickle tube feeds: - Vital 1.5 @ 20 ml/hr (480 ml/day)  Trickle tube feeding regimen provides 720 kcal, 32 grams of protein, and 367 ml of H2O (meets 41% of minimum kcal needs and 32% of minimum protein needs).   When clinical status allows, recommend advancement of tube feeds to goal regimen: - Vital 1.5 @ 50 ml/hr (1200 ml/day) - PROSource TF20 60 ml BID  Tube feeding regimen at recommended goal rate would provide 1960 kcal, 121 grams of protein, and 917 ml of H2O.   NUTRITION DIAGNOSIS:   Inadequate oral intake related to inability to eat as evidenced by NPO status.  Ongoing, being addressed via initiation of trickle TF  GOAL:   Patient will meet greater than or equal to 90% of their needs  Unmet at this time (trickle TF only)  MONITOR:   Vent status, Labs, Weight trends, TF tolerance, Skin, I & O's  REASON FOR ASSESSMENT:   Ventilator    ASSESSMENT:   76 yo female admitted with RLL CAP, ARDS, septic shock. PMH includes hemangioma of liver, hypothyroidism, anemia, HLD, HTN, CAD, CHF, clotting disorder, asthma, GERD, PAD, PAF, mitral vegetation, diverticulosis, AVM of colon, chronic back pain, lactose intolerance, anaphylaxis to pork derived products.  10/03 - Cortrak tube placement but unsuccessful 10/04 - OG tube placement attempted but unsuccessful  Discussed pt with RN and during ICU rounds. Pt to have small-bore NG tube placed by radiology under fluoroscopy today; however, per RN, this has been postponed to tomorrow pending Palliative Medicine meeting with pt's husband today to discuss [place]. Cortrak team was unsuccessful on Tuesday with placement and bedside placement of NG and OG tubes has been unsuccessful thus far. Pt is on day 3 with no nutrition. Consult received for initiation of trickle tube feeds only. Will leave  recommendations for trickle feeds and order tomorrow if NG tube placement is pursued.  Per Nephrology note, no indication for CRRT and recommendation is for comfort measures.  Admit weight: 74.8 kg Current weight: 91.9 kg  Pt with deep pitting edema to BUE and very deep pitting edema to BLE. Pt with deep pitting facial edema.  Patient is currently intubated on ventilator support MV: 9.6 L/min Temp (24hrs), Avg:97.8 F (36.6 C), Min:95.4 F (35.2 C), Max:99 F (37.2 C) BP (a-line): 125/67 MAP (a-line): 83  Drips: Propofol: 6.73 ml/hr (provides 178 kcal daily from lipid) Argatroban Amiodarone Fentanyl Levophed Vasopressin: off this AM  Medications reviewed and include: IV solu-cortef, SSI q 4 hours, IV protonix, IV abx, IV thiamine 500 mg daily  Labs reviewed: sodium 126, chloride <65, BUN 70, creatinine 4.69, ionized calcium 0.72, phosphorus 7.4, magnesium 1.5, elevated LFTs, WBC 21.3, hemoglobin 8.9 CBG's: 66-117 x 24 hours  UOP: 165 ml x 24 hours I/O's: +20.3 L since admit  Diet Order:   Diet Order             Diet NPO time specified  Diet effective now                   EDUCATION NEEDS:   No education needs have been identified at this time  Skin:  Skin Assessment: Skin Integrity Issues: DTI: vertebral column Other: BLE mottled from knee down  Last BM:  no documented BM  Height:   Ht Readings from Last 1 Encounters:  08/11/2022 '5\' 2"'$  (1.575 m)  Weight:   Wt Readings from Last 1 Encounters:  07/24/22 91.9 kg    Ideal Body Weight:  50 kg  BMI:  Body mass index is 37.06 kg/m.  Estimated Nutritional Needs:   Kcal:  7588-3254  Protein:  100-120 gm  Fluid:  1.8-2 L    Gustavus Bryant, MS, RD, LDN Inpatient Clinical Dietitian Please see AMiON for contact information.

## 2022-08-22 NOTE — Progress Notes (Signed)
Patient ID: Latoya [name], female   DOB: [birth year], 76 y.o.   MRN: [id number] [facility] Progress Note   Assessment/ Plan:   1.  Acute kidney injury: Possibly hemodynamically mediated acute kidney injury in the setting of volume contraction with ongoing ARB/diuretic use; evolving into ATN.  Anuric and with paradoxic improvement of BUN/creatinine that is likely dilutional with ongoing intravenous fluids.  No indication for CRRT and recommend transition to comfort measures at this point. 2.  Septic shock: Suspected to be secondary to right lower lobe pneumonia (possible aspiration event versus de novo community-acquired pneumonia).  On broad-spectrum antimicrobial coverage and pressor support. 3.  Altered mental status: Secondary to septic shock, monitor with management of underlying process. 4.  Acute hypoxic respiratory failure: Intubated and currently ongoing ventilator support/adjustments per CCM and respiratory service. 5.  Peripheral vascular disease: Significant duskiness/impaired perfusion of lower extremity noted on physical exam with focal finding on CT arteriogram.  Seen by vascular surgery and recommendations made for continued monitoring while awaiting demarcation.  Subjective:   Oliguric overnight without any acute events.  Objective:   BP (!) 84/61   Pulse (!) 108   Temp (!) 95.4 F (35.2 C)   Resp (!) 25   Ht '5\' 2"'$  (1.575 m)   Wt 91.9 kg   SpO2 93%   BMI 37.06 kg/m   Intake/Output Summary (Last 24 hours) at 2022/08/20 0908 Last data filed at August 20, 2022 0800 Gross per 24 hour  Intake 6948.43 ml  Output 115 ml  Net 6833.43 ml   Weight change:   Physical Exam: Gen: Intubated, sedated, comfortable.  Nose tip appears dusky CVS: Pulse regular rhythm, normal rate, S1 and S2 normal Resp: Anteriorly clear to auscultation, no rales/rhonchi Abd: Soft, obese, nontender Ext: Bilateral lower extremities with mottling.  Left leg with associated erythema/skin  blistering.  Imaging: DG Abd 1 View  Result Date: 07/25/2022 CLINICAL DATA:  OG tube placement EXAM: ABDOMEN - 1 VIEW COMPARISON:  Same day radiograph obtained at 12:04 p.m. FINDINGS: OG tube tip is in the proximal stomach with side port in the distal esophagus. No dilated gas-filled loops of bowel seen in the visualized abdomen and pelvis. Right-greater-than-left heterogeneous opacities of the partially visualized lungs, see recent prior chest radiograph IMPRESSION: OG tube tip is in the proximal stomach with side port in the distal esophagus. Recommend advancement. Electronically Signed   By: Yetta Glassman M.D.   On: 07/25/2022 13:41   DG Abd 1 View  Result Date: 07/25/2022 CLINICAL DATA:  Orogastric tube placement EXAM: ABDOMEN - 1 VIEW COMPARISON:  Portable exam 1204 hours compared to 07/24/2022 chest radiograph FINDINGS: Tip of nasogastric tube projects over proximal stomach though the proximal side-port projects over the distal esophagus, consider advancing tube 6 cm. Tip of endotracheal tube projects 1.4 cm above carina. Prior spinal fixation. Significant bibasilar atelectasis versus infiltrates. Overall low lung volumes. Paucity of bowel gas. IMPRESSION: Tip of nasogastric tube projects at the gastric fundus though the proximal side-port projects over the distal esophagus, recommend advancing tube 6 cm. Electronically Signed   By: Lavonia Dana M.D.   On: 07/25/2022 12:21    Labs: [employer] Recent Labs  Lab 07/29/2022 0807 08/05/2022 6962 08/10/2022 0828 08/18/2022 1021 07/22/2022 1406 07/30/2022 1559 07/24/22 0438 07/24/22 0542 07/25/22 0422 07/25/22 0523 07/25/22 1049 07/25/22 1943 07/25/22 2157 07/25/22 2331 Aug 20, 2022 0519  NA 135   < > 131*   < > 134*   < > 131*   < >  126* 122* 122* 120* 121* 120* 126*  K 5.2*   < > 5.0   < > 5.3*   < > 5.0   < > 4.9 4.5 4.3 4.7 4.6 4.6 4.9  CL 95*  --  102  --  96*  --  83*  --  71*  --   --   --   --   --  <65*  CO2 10*  --   --   --  14*  --  16*  --   16*  --   --   --   --   --  27  GLUCOSE 71  --  64*  --  63*  --  217*  --  240*  --   --   --   --   --  111*  BUN 96*  --  113*  --  90*  --  85*  --  80*  --   --   --   --   --  79*  CREATININE 6.44*  --  6.70*  --  5.80*  --  5.31*  --  5.09*  --   --   --   --   --  4.69*  CALCIUM 9.4  --   --   --  7.8*  --  7.6*  --  7.2*  --   --   --   --   --  6.4*  PHOS  --   --   --   --   --   --   --   --   --   --   --   --   --   --  7.4*   < > = values in this interval not displayed.   CBC Recent Labs  Lab 07/24/2022 0807 07/30/2022 0814 07/24/22 0438 07/24/22 0542 07/25/22 0422 07/25/22 0523 07/25/22 1943 07/25/22 2157 07/25/22 2331 05-Aug-2022 0519  WBC 19.8*  --  24.9*  --  23.9*  --   --   --   --  21.3*  NEUTROABS 16.5*  --   --   --   --   --   --   --   --   --   HGB 13.9   < > 11.3*   < > 9.1*   < > 9.2* 8.5* 8.5* 8.9*  HCT 41.4   < > 33.2*   < > 25.4*   < > 27.0* 25.0* 25.0* 24.7*  MCV 86.4  --  84.1  --  83.3  --   --   --   --  79.7*  PLT 365  --  295  --  186  --   --   --   --  173   < > = values in this interval not displayed.    Medications:     artificial tears   Both Eyes Q8H   hydrocortisone sod succinate (SOLU-CORTEF) inj  100 mg Intravenous Q12H   insulin aspart  0-20 Units Subcutaneous Q4H   insulin glargine-yfgn  10 Units Subcutaneous Daily   ipratropium-albuterol  3 mL Nebulization Q6H   mouth rinse  15 mL Mouth Rinse Q2H   pantoprazole (PROTONIX) IV  40 mg Intravenous Q24H   sodium chloride flush  10-40 mL Intracatheter Q12H   Elmarie Shiley, MD 08/05/2022, 9:08 AM

## 2022-08-22 NOTE — Progress Notes (Signed)
[facility] for IV argatroban Indication: atrial fibrillation, hx DVT, antiphospholipid syndrome  Allergies  Allergen Reactions   Chlorhexidine Anaphylaxis, Itching and Other (See Comments)    Sensitive to dye in Betadine & Chlorohexadine    Enoxaparin Sodium Other (See Comments)    Hx anaphylaxis to pork   Heparin Other (See Comments)    Hx anaphylaxis to pork   Hornet Venom Anaphylaxis and Other (See Comments)    [place] Hornets   Pork-Derived Products Anaphylaxis   Povidone-Iodine Itching, Rash and Other (See Comments)    NOTE: Patient has had anaphylactic reaction to Betadine due to dyes in product. Sensitivity-" but if its wiped off she is able to tolerate betadine"    Indomethacin Rash   Lactose Intolerance (Gi) Diarrhea, Nausea And Vomiting and Other (See Comments)    Can tolerate most [place] cheeses (Takes Lactaid)   Phenylpropanolamine Hypertension    Patient Measurements: Height: '5\' 2"'$  (157.5 cm) Weight: 91.9 kg (202 lb 9.6 oz) IBW/kg (Calculated) : 50.1 Heparin Dosing Weight: n/a  Vital Signs: Temp: 96.8 F (36 C) (10/05 0600) Temp Source: Bladder (10/05 0400) BP: 108/66 (10/05 0600) Pulse Rate: 113 (10/05 0600)  Labs: Recent Labs    07/31/2022 0807 07/31/2022 0814 08/03/2022 1034 08/21/2022 1406 08/02/2022 1559 07/24/22 0438 07/24/22 0542 07/25/22 0422 07/25/22 0523 07/25/22 1533 07/25/22 1943 07/25/22 2026 07/25/22 2157 07/25/22 2331 08-15-22 0519  HGB 13.9   < >  --   --    < > 11.3*   < > 9.1*   < >  --    < >  --  8.5* 8.5* 8.9*  HCT 41.4   < >  --   --    < > 33.2*   < > 25.4*   < >  --    < >  --  25.0* 25.0* 24.7*  PLT 365  --   --   --   --  295  --  186  --   --   --   --   --   --  173  APTT  --   --   --   --    < > 79*   < > 105*   < > 88*  --  88*  --   --  86*  CREATININE 6.44*   < >  --  5.80*  --  5.31*  --  5.09*  --   --   --   --   --   --   --   TROPONINIHS 31*  --  37*  --   --   --   --   --   --    --   --   --   --   --   --    < > = values in this interval not displayed.     Estimated Creatinine Clearance: 10.1 mL/min (A) (by C-G formula based on SCr of 5.09 mg/dL (H)).   Assessment: 76 yo female on chronic Xarelto for afib, hx DVT.  Pharmacy asked to begin anticoagulation with argatroban while critically ill.  Avoiding heparin and lovenox since they are pork-derived products and patient reports a history of anaphylaxis to pork.  Last Xarelto dose reported to be 9/30.  aPTT therapeutic at 86 sec on argatroban at 0.0.98 mcg/kg/min.  No issues with infusion or bleeding per RN. CBC stable  Goal of Therapy:  aPTT 50-90 seconds Monitor platelets by  anticoagulation protocol: Yes   Plan:  Continue argatroban at 0.098 mcg/kg/min Daily aPTT and CBC  Adaliz Dobis D. Mina Marble, PharmD, BCPS, [place] Aug 14, 2022, 7:29 AM

## 2022-08-22 NOTE — Progress Notes (Signed)
  Transition of Care [facility]) Screening Note   Patient Details  Name: Cambre Matson [facility] Date of Birth: 1946-04-05   Transition of Care [facility]) CM/SW Contact:    Tom-Johnson, Renea Ee, RN Phone Number: 19-Aug-2022, 2:28 PM  Patient is admitted for Pneumonia, currently intubated and sedated.  Transition of Care [facility]) has reviewed patient and no TOC needs or recommendations have been identified at this time. TOC will continue to monitor patient advancement through interdisciplinary progression rounds. If new patient transition needs arise, please place a TOC consult.

## 2022-08-22 NOTE — Progress Notes (Signed)
NAME:  Latoya [name], MRN:  [id number], DOB:  [birth year], LOS: 3 ADMISSION DATE:  07/29/2022, CONSULTATION DATE:  08/11/2022 REFERRING MD:  Dr. Gilford Raid, CHIEF COMPLAINT:  Hypoxemia   History of Present Illness:  76 y o F presented to ED with altered mental status, hypoxemic to 70% on room air.  Patient was placed on nonrebreather, and then subsequently intubated due to acute hypoxic respiratory failure.  Patient's last known well was Sunday.  Per husband, patient had preceding cough, and decreased energy.  She also reported increased pain in her feet, but at baseline is able to ambulate with a walker.  It is unclear if patient has been adherent to medications prior to presentation in emergency department.  Per husband at bedside, patient is a DNR; however, this is not reciprocated in patient's chart.  In ED:  CT of head demonstrated no acute intracranial abnormality.  CT angiography AO +BLFEM showed mild to moderate stenosis at the origin of the left posterior tibial artery without identified flow-limiting stenosis of any other lower extremity arteries.  There is three-vessel runoff to the ankles bilaterally.  No abnormality of the abdominal aorta and mesenteric arteries.  Bilateral lower lobe and right middle lobe patchy opacities were identified. Chest x-ray with multiple bilateral patchy airspace opacities, right greater than left, concerning for multifocal pneumonia.  Patient placed with left side down, with resulting improved oxygenation.  ABG progressively acidotic, with most recent at 7.065/40.7/72/11 0.5, as such bicarb GTT and 2 ampoules of sodium bicarbonate ordered.  Ceftriaxone and azithromycin administered prior to CCM consult.  Patient has right femoral vein central line, and 1 left UE peripheral IV for access.    Pertinent  Medical History  Hemangioma of liver Hypothyroidism (Synthroid 100 mcg daily) Anemia of chronic disease Hyperlipidemia essential  Hypertension (Lopressor 100 mg  twice daily, Cozaar 0.5 mg daily) CAD in native artery Asthma GERD Peripheral artery disease Cervical pseudoarthrosis History of enterococcal bacteremia History of E. coli bacteremia History of pulmonary embolism (Xarelto 20 mg daily) Paroxysmal atrial fibrillation Mitral vegetation History of DVT Diverticulosis AVM of colon without hemorrhage Chronic back pain   Significant Hospital Events: Including procedures, antibiotic start and stop dates in addition to other pertinent events   Patient presented to ED with acute hypoxic respiratory failure, subsequently intubated, on max FiO2.  PEEP and respiratory rate were increased to augment persistent hypoxemia.  Right femoral central venous catheter placed for medication access.  Interim History / Subjective:  Patient remains on low dose vasopressor support Currently on full mechanical ventilatory support Lactate remain elevated >9 Serum bicarb increased to 47   Objective   Blood pressure (!) 84/61, pulse (!) 109, temperature (!) 95.7 F (35.4 C), resp. rate (!) 25, height '5\' 2"'$  (1.575 m), weight 91.9 kg, SpO2 90 %.    Vent Mode: PRVC FiO2 (%):  [50 %] 50 % Set Rate:  [25 bmp-35 bmp] 25 bmp Vt Set:  [400 mL] 400 mL PEEP:  [10 cmH20] 10 cmH20 Plateau Pressure:  [26 cmH20-28 cmH20] 27 cmH20   Intake/Output Summary (Last 24 hours) at 2022-08-11 0944 Last data filed at 11-Aug-2022 0900 Gross per 24 hour  Intake 7206.63 ml  Output 125 ml  Net 7081.63 ml   Filed Weights   08/11/2022 0800 07/28/2022 1205 07/24/22 0458  Weight: 74.8 kg 89 kg 91.9 kg    Examination: Physical exam: General: Crtitically ill-appearing female, orally intubated HEENT: [place]/AT, eyes anicteric.  ETT and OGT in place Neuro: Sedated, not  following commands.  Eyes are closed.  Pupils 3 mm bilateral reactive to light.  Positive cough and gag Chest: Bilateral rhonchorous breath sounds right more than left, no wheezes or crackles Heart: Irregularly irregular,  tachycardic, no murmurs or gallops Abdomen: Soft, nontender, nondistended, bowel sounds present Skin: Mottled all 4 extremities, right foot is cold, there is erythema and swelling of bilateral lower extremities, with few pustular spots on left leg   Resolved Hospital Problem list     Assessment & Plan:  Acute hypoxic/hypercapnic respiratory failure  Acute septic encephalopathy Septic shock Right lobe pneumonia Left leg cellulitis lactic acidosis Continue lung protective ventilation Not ready for SBT/SAT Continue titrate FiO2 with O2 sat goal 92% PAD protocol in place with propofol and fentanyl with RASS goal 0/-1 Titrate vasopressor with map goal 65 Currently off vasopressin, on Levophed at 2 mics Continue stress dose steroids Continue IV antibiotics with ceftriaxone and clindamycin to cover for possible extensive left leg cellulitis and pneumonia Cultures have been negative Lactic acid remain elevated  Continue high-dose thiamine  AKI High anion gap metabolic acidosis Hyponatremia Hypocalcemia Potentially ATN secondary to septic shock Baseline serum creatinine is 0.8 Serum creatinine has trended down likely due to dilution from volume resuscitation Appreciate nephrology follow-up She remains oliguric/anuric Will try again Lasix challenge Closely monitor electrolytes Serum bicarbonate trended up to 27, stop bicarbonate infusion Strict I's and O's Avoid nephrotoxic medications Serum sodium remain low with low serum calcium Continue aggressive electrolyte supplement  Paroxysmal atrial fibrillation with RVR Patient had runs of A-fib with RVR Continue amiodarone infusion Heart rate is still not well controlled, cannot restart beta-blocker or calcium channel blocker due to vasopressor requirement Continue argatroban for stroke prophylaxis due to heparin allergy  Peripheral vascular disease Possible left leg cellulitis Patient has mottled all 4 extremities, cold right  foot Appreciate vascular surgery input, no intervention is required at this time Continue IV antibiotics  History of DVT and PE Patient is on Xarelto at home Currently continue argatroban   Diabetes, type II Patient's hemoglobin A1c 7.8 She became hypoglycemic overnight Stop seemingly Continue sliding scale Continue low-dose D10 infusion,.  Once blood sugar reaches more than 100   Best Practice (right click and "Reselect all SmartList Selections" daily)   Diet/type: NPO DVT prophylaxis: other  GI prophylaxis: PPI Lines: Central line, Arterial Line, and yes and it is still needed Foley:  Yes, and it is still needed Code Status:  DNR Last date of multidisciplinary goals of care discussion [10/5 patient's husband was updated at bedside]  Labs   CBC: Recent Labs  Lab 08/19/2022 0807 07/22/2022 0814 07/24/22 0438 07/24/22 0542 07/25/22 0422 07/25/22 0523 07/25/22 1049 07/25/22 1943 07/25/22 2157 07/25/22 2331 08-13-2022 0519  WBC 19.8*  --  24.9*  --  23.9*  --   --   --   --   --  21.3*  NEUTROABS 16.5*  --   --   --   --   --   --   --   --   --   --   HGB 13.9   < > 11.3*   < > 9.1*   < > 9.2* 9.2* 8.5* 8.5* 8.9*  HCT 41.4   < > 33.2*   < > 25.4*   < > 27.0* 27.0* 25.0* 25.0* 24.7*  MCV 86.4  --  84.1  --  83.3  --   --   --   --   --  79.7*  PLT  365  --  295  --  186  --   --   --   --   --  173   < > = values in this interval not displayed.    Basic Metabolic Panel: Recent Labs  Lab 08/14/2022 0807 08/21/2022 0814 08/06/2022 0828 07/22/2022 1021 08/11/2022 1406 08/12/2022 1559 07/24/22 0438 07/24/22 0542 07/25/22 0422 07/25/22 0523 07/25/22 1049 07/25/22 1943 07/25/22 2157 07/25/22 2331 2022-08-16 0519  NA 135   < > 131*   < > 134*   < > 131*   < > 126*   < > 122* 120* 121* 120* 126*  K 5.2*   < > 5.0   < > 5.3*   < > 5.0   < > 4.9   < > 4.3 4.7 4.6 4.6 4.9  CL 95*  --  102  --  96*  --  83*  --  71*  --   --   --   --   --  <65*  CO2 10*  --   --   --  14*  --  16*   --  16*  --   --   --   --   --  27  GLUCOSE 71  --  64*  --  63*  --  217*  --  240*  --   --   --   --   --  111*  BUN 96*  --  113*  --  90*  --  85*  --  80*  --   --   --   --   --  79*  CREATININE 6.44*  --  6.70*  --  5.80*  --  5.31*  --  5.09*  --   --   --   --   --  4.69*  CALCIUM 9.4  --   --   --  7.8*  --  7.6*  --  7.2*  --   --   --   --   --  6.4*  MG  --   --   --   --   --   --   --   --   --   --   --   --   --   --  1.5*  PHOS  --   --   --   --   --   --   --   --   --   --   --   --   --   --  7.4*   < > = values in this interval not displayed.   GFR: Estimated Creatinine Clearance: 10.9 mL/min (A) (by C-G formula based on SCr of 4.69 mg/dL (H)). Recent Labs  Lab 08/16/2022 0807 07/29/2022 1034 07/24/2022 1406 08/02/2022 1636 07/24/22 0438 07/24/22 1007 07/24/22 2149 07/25/22 0422 07/25/22 0901 07/25/22 1200 August 16, 2022 0519  PROCALCITON  --   --  44.06  --   --   --   --   --   --   --   --   WBC 19.8*  --   --   --  24.9*  --   --  23.9*  --   --  21.3*  LATICACIDVEN >9.0*   < >  --    < >  --  >9.0* >9.0*  --  >9.0* >9.0*  --    < > = values in this interval not displayed.  Liver Function Tests: Recent Labs  Lab 08/08/2022 1406 07/25/22 0422 Aug 07, 2022 0519  AST 128* 173* 205*  ALT 61* 83* 80*  ALKPHOS 172* 168* 200*  BILITOT 0.7 0.6 0.9  PROT 5.1* 4.2* 4.1*  ALBUMIN 2.0* 1.7* 1.6*   No results for input(s): "LIPASE", "AMYLASE" in the last 168 hours. No results for input(s): "AMMONIA" in the last 168 hours.  ABG    Component Value Date/Time   PHART 7.498 (H) 07/25/2022 2331   PCO2ART 37.9 07/25/2022 2331   PO2ART 80 (L) 07/25/2022 2331   HCO3 29.5 (H) 07/25/2022 2331   TCO2 31 07/25/2022 2331   ACIDBASEDEF 3.0 (H) 07/25/2022 1049   O2SAT 97 07/25/2022 2331     Coagulation Profile: No results for input(s): "INR", "PROTIME" in the last 168 hours.  Cardiac Enzymes: No results for input(s): "CKTOTAL", "CKMB", "CKMBINDEX", "TROPONINI" in the last  168 hours.  HbA1C: Hemoglobin A1C  Date/Time Value Ref Range Status  12/21/2015 12:00 AM 5.8  Final  04/22/2013 12:00 AM 6.1  Final   Hgb A1c MFr Bld  Date/Time Value Ref Range Status  04/16/2022 02:39 PM 7.8 (H) 4.6 - 6.5 % Final    Comment:    Glycemic Control Guidelines for People with Diabetes:Non Diabetic:  <6%Goal of Therapy: <7%Additional Action Suggested:  >8%   10/11/2021 02:16 PM 7.2 (H) 4.6 - 6.5 % Final    Comment:    Glycemic Control Guidelines for People with Diabetes:Non Diabetic:  <6%Goal of Therapy: <7%Additional Action Suggested:  >8%     CBG: Recent Labs  Lab 07/25/22 1939 07/25/22 2023 07/25/22 2155 07/25/22 2326 08-07-22 0519  GLUCAP 66* 117* 97 91 117*    Total critical care time: 38 minutes  Performed by: [facility] care time was exclusive of separately billable procedures and treating other patients.   Critical care was necessary to treat or prevent imminent or life-threatening deterioration.   Critical care was time spent personally by me on the following activities: development of treatment plan with patient and/or surrogate as well as nursing, discussions with consultants, evaluation of patient's response to treatment, examination of patient, obtaining history from patient or surrogate, ordering and performing treatments and interventions, ordering and review of laboratory studies, ordering and review of radiographic studies, pulse oximetry and re-evaluation of patient's condition.   Jacky Kindle, MD [facility] See Amion for pager If no response to pager, please call [phone] until 7pm After 7pm, Please call E-link [phone]

## 2022-08-22 NOTE — Death Summary Note (Addendum)
DEATH SUMMARY   Patient Details  Name: Latoya [name] MRN: [id number] DOB: 22-Mar-1946  Admission/Discharge Information   Admit Date:  08-22-22  Date of Death:  25-Aug-2022  Time of Death:  74  Length of Stay: 3  Referring Physician: Darreld Mclean, MD   Reason(s) for Hospitalization  Acute hypoxic/hypercapnic respiratory failure  Acute septic encephalopathy Septic shock Right lobe pneumonia Left leg cellulitis lactic acidosis AKI High anion gap metabolic acidosis Hyponatremia Hypocalcemia Paroxysmal atrial fibrillation with RVR History of DVT and PE Peripheral vascular disease Possible left leg cellulitis Poorly controlled diabetes, type II with hypo and hyperglycemia  Diagnoses  Preliminary cause of death:   Withdrawal of care in the setting of septic shock, persistent lactic acidosis and acute hypoxic/hypercapnic respiratory failure Secondary Diagnoses (including complications and co-morbidities):  Active Problems:   Pneumonia   Brief Hospital Course (including significant findings, care, treatment, and services provided and events leading to death)  Latoya [name] is a 76 y.o. year old female who  presented to ED with altered mental status, hypoxemic to 70% on room air.  Patient was placed on nonrebreather, and then subsequently intubated due to acute hypoxic respiratory failure.  Patient's last known well was Sunday.  Per husband, patient had preceding cough, and decreased energy.  She also reported increased pain in her feet, but at baseline is able to ambulate with a walker.  It is unclear if patient has been adherent to medications prior to presentation in emergency department.   Per husband at bedside, patient is a DNR; however, this is not reciprocated in patient's chart.   In ED:  CT of head demonstrated no acute intracranial abnormality.  CT angiography AO +BLFEM showed mild to moderate stenosis at the origin of the left posterior tibial artery without  identified flow-limiting stenosis of any other lower extremity arteries.  There is three-vessel runoff to the ankles bilaterally.  No abnormality of the abdominal aorta and mesenteric arteries.  Bilateral lower lobe and right middle lobe patchy opacities were identified. Chest x-ray with multiple bilateral patchy airspace opacities, right greater than left, concerning for multifocal pneumonia.  Patient placed with left side down, with resulting improved oxygenation.  ABG progressively acidotic, with most recent at 7.065/40.7/72/11 0.5, as such bicarb GTT and 2 ampoules of sodium bicarbonate ordered.  Ceftriaxone and azithromycin administered prior to CCM consult.  Patient has right femoral vein central line, and 1 left UE peripheral IV for access.    Patient was continued on lung protective ventilation, hypercapnia was cleared with adjustment of ventilatory setting, she remained encephalopathic in the setting of septic shock with septic encephalopathy, she was continued on IV vasopressor support and she was on antibiotic for pneumonia and left lower extremity cellulitis, she did have persistent lactic acidosis without much improvement she had a high anion gap metabolic acidosis she was on bicarbonate infusion, metabolic acidosis was corrected but remained anuric, nephrology was consulted, patient was deemed not stable candidate for CRRT.  Her electrolytes were replaced and closely monitored, she frequently went into A-fib with RVR, she was continued on IV amiodarone.  She was allergic to IV heparin so IV argatroban was continued for stroke prophylaxis.  Vascular surgery was consulted for right lower extremity ischemia, after evaluation vascular surgery recommended no surgical intervention and close monitoring.  As patient's lactic acidosis was not clearing, she remained in persistent septic shock with septic encephalopathy, goals of care discussions were carried with family and palliative care team, decision was  to proceed with comfort care and palliative extubation.  Patient was declared dead on 2022-08-16 at 4:30 p.m.  Patient's family was at bedside    Pertinent Labs and Studies  Significant Diagnostic Studies DG Abd 1 View  Result Date: 07/25/2022 CLINICAL DATA:  OG tube placement EXAM: ABDOMEN - 1 VIEW COMPARISON:  Same day radiograph obtained at 12:04 p.m. FINDINGS: OG tube tip is in the proximal stomach with side port in the distal esophagus. No dilated gas-filled loops of bowel seen in the visualized abdomen and pelvis. Right-greater-than-left heterogeneous opacities of the partially visualized lungs, see recent prior chest radiograph IMPRESSION: OG tube tip is in the proximal stomach with side port in the distal esophagus. Recommend advancement. Electronically Signed   By: Yetta Glassman M.D.   On: 07/25/2022 13:41   DG Abd 1 View  Result Date: 07/25/2022 CLINICAL DATA:  Orogastric tube placement EXAM: ABDOMEN - 1 VIEW COMPARISON:  Portable exam 1204 hours compared to 07/24/2022 chest radiograph FINDINGS: Tip of nasogastric tube projects over proximal stomach though the proximal side-port projects over the distal esophagus, consider advancing tube 6 cm. Tip of endotracheal tube projects 1.4 cm above carina. Prior spinal fixation. Significant bibasilar atelectasis versus infiltrates. Overall low lung volumes. Paucity of bowel gas. IMPRESSION: Tip of nasogastric tube projects at the gastric fundus though the proximal side-port projects over the distal esophagus, recommend advancing tube 6 cm. Electronically Signed   By: Lavonia Dana M.D.   On: 07/25/2022 12:21   DG Chest Port 1 View  Result Date: 07/24/2022 CLINICAL DATA:  Ventilator dependent respiratory failure. EXAM: PORTABLE CHEST 1 VIEW COMPARISON:  Portable chest yesterday at 4:52 p.m. FINDINGS: 5:23 a.m. ETT tip is 4 cm from the carina. The patient is rotated somewhat to the right. The cardiac size is normal.  Stable mediastinal configuration.  Patchy airspace consolidation throughout the right lung fields, small less confluent airspace disease left infrahilar lower lobe area, are redemonstrated unchanged with left lung otherwise clear. There is a chronically elevated right hemidiaphragm. No pleural collection is seen. Fusion hardware again noted in the cervical and lower thoracic/upper lumbar spine. IMPRESSION: No new abnormality. Right-greater-than-left airspace disease is similar. Overall aeration is unchanged. Electronically Signed   By: Telford Nab M.D.   On: 07/24/2022 07:41   VAS [place] LOWER EXTREMITY VENOUS (DVT)  Result Date: 08/07/2022  Lower Venous DVT Study Patient Name:  DORINDA STEHR  Date of Exam:   08/02/2022 Medical Rec #: [id number]       Accession #:    [id number] Date of Birth: 05/02/1946       Patient Gender: F Patient Age:   35 years Exam Location:  [facility] Procedure:      VAS [place] LOWER EXTREMITY VENOUS (DVT) Referring Phys: Kipp Brood --------------------------------------------------------------------------------  Indications: Edema.  Risk Factors: HX of DVT & PE, antiphospholipid syndrome. Limitations: Body habitus, poor ultrasound/tissue interface and line placements to bilateral groin areas with overlying bandages and left side catheter placement to upper thigh. Comparison Study: No previous exams Performing Technologist: Jody Hill RVT, RDMS  Examination Guidelines: A complete evaluation includes B-mode imaging, spectral Doppler, color Doppler, and power Doppler as needed of all accessible portions of each vessel. Bilateral testing is considered an integral part of a complete examination. Limited examinations for reoccurring indications may be performed as noted. The reflux portion of the exam is performed with the patient in reverse Trendelenburg.  +--------+---------------+---------+-----------+----------+--------------------+ RIGHT   CompressibilityPhasicitySpontaneityPropertiesThrombus Aging        +--------+---------------+---------+-----------+----------+--------------------+  CFV                    Yes      Yes                  patent by                                                                 color/doppler        +--------+---------------+---------+-----------+----------+--------------------+ SFJ                                                  Not visualized       +--------+---------------+---------+-----------+----------+--------------------+ FV Prox Full           Yes      Yes                                       +--------+---------------+---------+-----------+----------+--------------------+ FV Mid  Full           Yes      Yes                                       +--------+---------------+---------+-----------+----------+--------------------+ FV      Full           Yes      Yes                                       Distal                                                                    +--------+---------------+---------+-----------+----------+--------------------+ PFV                                                  Not visualized       +--------+---------------+---------+-----------+----------+--------------------+ POP     Full           Yes      Yes                                       +--------+---------------+---------+-----------+----------+--------------------+ PTV                                                  Not  visualized       +--------+---------------+---------+-----------+----------+--------------------+ PERO                                                 Not visualized       +--------+---------------+---------+-----------+----------+--------------------+   +--------+---------------+---------+-----------+----------+--------------------+ LEFT    CompressibilityPhasicitySpontaneityPropertiesThrombus Aging       +--------+---------------+---------+-----------+----------+--------------------+  CFV     Full           Yes      Yes                                       +--------+---------------+---------+-----------+----------+--------------------+ SFJ     Full                                                              +--------+---------------+---------+-----------+----------+--------------------+ FV Prox Full           Yes      Yes                                       +--------+---------------+---------+-----------+----------+--------------------+ FV Mid  Full           Yes      Yes                                       +--------+---------------+---------+-----------+----------+--------------------+ FV      Full           Yes      Yes                                       Distal                                                                    +--------+---------------+---------+-----------+----------+--------------------+ PFV                    Yes      Yes                  patent by                                                                 color/doppler        +--------+---------------+---------+-----------+----------+--------------------+ POP     Full           Yes  Yes                                       +--------+---------------+---------+-----------+----------+--------------------+ PTV     Full                                                              +--------+---------------+---------+-----------+----------+--------------------+ PERO    Full                                                              +--------+---------------+---------+-----------+----------+--------------------+     Summary: BILATERAL: -No evidence of popliteal cyst, bilaterally. RIGHT: - There is no evidence of deep vein thrombosis in the lower extremity. However, portions of this examination were limited- see technologist comments above.  LEFT: - There is no evidence of deep vein thrombosis in the lower extremity. However, portions  of this examination were limited- see technologist comments above.  *See table(s) above for measurements and observations. Electronically signed by Servando Snare MD on 07/31/2022 at 7:50:54 PM.    Final    DG Chest Portable 1 View  Result Date: 07/31/2022 CLINICAL DATA:  Intubation. EXAM: PORTABLE CHEST 1 VIEW COMPARISON:  Earlier same day chest radiograph at 0816 hours FINDINGS: Of note, patient is mildly rotated and tilted on this portable examination. Endotracheal tube tip projects 3.9 cm above the carina. Unchanged multifocal patchy airspace opacities, right-greater-than-left. No pleural effusion or pneumothorax. Heart is normal in size. Spinal instrumentation in the distal cervical and thoracic spine. IMPRESSION: Endotracheal tube tip projects 3.9 cm above the carina. Unchanged multifocal patchy airspace opacities, right-greater-than-left. Electronically Signed   By: Ileana Roup M.D.   On: 08/08/2022 17:15   CT Angio Aortobifemoral W and/or Wo Contrast  Result Date: 08/21/2022 CLINICAL DATA:  Altered mental status Vomiting Nonpalpable lower extremity pulses Discolored lower extremities below the knee EXAM: CT ANGIOGRAPHY OF ABDOMINAL AORTA WITH ILIOFEMORAL RUNOFF TECHNIQUE: Multidetector CT imaging of the abdomen, pelvis and lower extremities was performed using the standard protocol during bolus administration of intravenous contrast. Multiplanar CT image reconstructions and MIPs were obtained to evaluate the vascular anatomy. RADIATION DOSE REDUCTION: This exam was performed according to the departmental dose-optimization program which includes automated exposure control, adjustment of the mA and/or kV according to patient size and/or use of iterative reconstruction technique. CONTRAST:  100 mL OMNIPAQUE IOHEXOL 350 MG/ML SOLN COMPARISON:  None available FINDINGS: VASCULAR Aorta: Normal caliber aorta without aneurysm, dissection, vasculitis or significant stenosis. Celiac: Patent without evidence of  aneurysm, dissection, vasculitis or significant stenosis. SMA: Patent without evidence of aneurysm, dissection, vasculitis or significant stenosis. Renals: Both renal arteries are patent without evidence of aneurysm, dissection, vasculitis, fibromuscular dysplasia or significant stenosis. IMA: Patent without evidence of aneurysm, dissection, vasculitis or significant stenosis. RIGHT Lower Extremity Inflow: Mild diffuse calcified plaque of the common iliac artery. Evaluation for stenosis is limited due to streak artifact from spine hardware. Evaluation of the origin of the internal iliac artery is limited due to streak artifact from spine hardware. The  internal iliac artery branches are patent. Evaluation of the origin of the external iliac artery is limited due to streak artifact. It is otherwise patent. Outflow: Mild calcified plaque in the common femoral artery without flow-limiting stenosis. Profunda femoris artery is patent without significant stenosis. Minimal focal calcified plaque of the distal superficial femoral artery without flow-limiting stenosis. Popliteal artery is diminutive but patent without significant stenosis. Runoff: Patent three vessel runoff to the ankle. LEFT Lower Extremity Inflow: Common, internal and external iliac arteries are patent without evidence of aneurysm, dissection, vasculitis or significant stenosis. Outflow: No significant stenosis of the common femoral artery. The profundus femoris artery is patent. There is mild stenosis at the origin due to focal calcified plaque. The superficial femoral artery is patent no significant stenosis. Evaluation of the left popliteal artery is significantly limited due to streak artifact from left total knee prosthesis. The distal popliteal artery is patent. Runoff: Tibioperoneal trunk is mildly narrowed due to calcified plaque. There is mild to moderate stenosis at the origin of the posterior tibial artery due to calcified plaque. There is  three-vessel runoff to the left ankle. Veins: Right common femoral vein approach central venous catheter terminates in the common iliac vein. Review of the MIP images confirms the above findings. NON-VASCULAR Lower chest: Airspace opacity within the lower lobes bilaterally may be due to atelectasis or pneumonia. Bronchiectasis also noted in the right middle lobe with adjacent airspace opacity. Hepatobiliary: Visualized portions the liver are unremarkable. Postsurgical changes of cholecystectomy are seen. No bile duct dilatation. Pancreas: Unremarkable. No pancreatic ductal dilatation or surrounding inflammatory changes. Spleen: Normal in size without focal abnormality. Adrenals/Urinary Tract: Adrenal glands are unremarkable, evaluation of the kidneys, particularly the left side is limited due to streak artifact as well as motion. No hydronephrosis or hydroureter. The bladder is collapsed around a Foley catheter balloon which limits evaluation. Stomach/Bowel: Small hiatal hernia. Small duodenal diverticulum. No bowel dilatation to indicate ileus or obstruction. Appendix is not definitively identified, however no right lower quadrant phlegm a tori changes are present. Evaluation of the bowel is limited due to respiratory motion. Scattered colonic diverticula are present without evidence of acute diverticulitis. Lymphatic: No enlarged abdominal or pelvic lymph nodes. Reproductive: Uterus is either atrophic or absent. It is not well visualized. No adnexal abnormalities. Other: Bilateral breast prostheses are partially visualized. Single surgical clip noted in the left rectus sheath. Musculoskeletal: Long segment spinal fusion changes again seen. Left total knee prosthesis is seen. No acute osseous abnormality. Limitations: Evaluation limited due to respiratory motion and streak artifact. IMPRESSION: 1. Mild to moderate stenosis at the origin of the left posterior tibial artery. Otherwise, no flow-limiting stenosis of  the lower extremity arteries. Three-vessel runoff to the ankles bilaterally. 2. No significant abnormality of the abdominal aorta or mesenteric arteries. NON-VASCULAR 1. Bilateral lower lobe and right middle lobe airspace opacities, suspicious for multifocal pneumonia. 2. Colonic diverticulosis. Electronically Signed   By: Miachel Roux M.D.   On: 08/14/2022 10:41   CT Head Wo Contrast  Result Date: 07/25/2022 CLINICAL DATA:  Altered mental status.  Patient found on floor. EXAM: CT HEAD WITHOUT CONTRAST TECHNIQUE: Contiguous axial images were obtained from the base of the skull through the vertex without intravenous contrast. RADIATION DOSE REDUCTION: This exam was performed according to the departmental dose-optimization program which includes automated exposure control, adjustment of the mA and/or kV according to patient size and/or use of iterative reconstruction technique. COMPARISON:  CT head 09/16/2009 FINDINGS: Of note, streak artifact  from dental hardware obscures posterior fossa. Brain: No evidence of acute infarction, hemorrhage, hydrocephalus, extra-axial collection or mass lesion/mass effect. Generalized parenchymal volume loss, within normal limits for patient age. Scattered hypodensities in the periventricular and subcortical white matter, consistent with chronic small-vessel ischemic changes. Vascular: Vascular calcifications at the skull base no hyperdense vessel or unexpected calcification. Skull: Hyperostosis frontalis interna. Negative for fracture or focal lesion. Sinuses/Orbits: No acute finding. Trace mucosal thickening in the right maxillary sinus. The other visualized paranasal sinuses and mastoid air cells are clear. Hypoplastic right frontal sinus. Other: None. IMPRESSION: No acute intracranial abnormality. Generalized parenchymal volume loss and chronic small-vessel ischemic changes. Electronically Signed   By: Ileana Roup M.D.   On: 08/07/2022 10:14   DG Chest Port 1 View  Result  Date: 08/10/2022 CLINICAL DATA:  Shortness of breath.  Respiratory distress. EXAM: PORTABLE CHEST 1 VIEW COMPARISON:  Chest radiograph 12/03/2018 and earlier FINDINGS: Of note, the patient is mildly rotated and tilted on this portable examination. Patchy airspace opacities seen throughout the right lung as well as in the left lung base. No pneumothorax or large volume pleural effusion. Stable elevation of the right hemidiaphragm. Heart size is difficult to assess due to adjacent consolidations but appears stable. Partially visualized anterior cervical spinal fixation and posterior bilateral pedicle screw and rod fixation of the distal thoracic and lumbar spine. Surgical clips in the right upper abdomen. IMPRESSION: Multiple bilateral patchy airspace opacities, right-greater-than-left, concerning for multifocal pneumonia. Electronically Signed   By: Ileana Roup M.D.   On: 08/02/2022 08:23    Microbiology Recent Results (from the past 240 hour(s))  Culture, blood (routine x 2)     Status: None (Preliminary result)   Collection Time: 08/19/2022  8:05 AM   Specimen: BLOOD LEFT FOREARM  Result Value Ref Range Status   Specimen Description BLOOD LEFT FOREARM  Final   Special Requests   Final    BOTTLES DRAWN AEROBIC AND ANAEROBIC Blood Culture results may not be optimal due to an excessive volume of blood received in culture bottles   Culture   Final    NO GROWTH 3 DAYS Performed at [facility], [place] [address] [place], [place] [address]    Report Status PENDING  Incomplete  Culture, blood (routine x 2)     Status: None (Preliminary result)   Collection Time: 08/10/2022  8:10 AM   Specimen: BLOOD  Result Value Ref Range Status   Specimen Description BLOOD LEFT ANTECUBITAL  Final   Special Requests   Final    BOTTLES DRAWN AEROBIC AND ANAEROBIC Blood Culture adequate volume   Culture   Final    NO GROWTH 3 DAYS Performed at [facility], [place] [address], [place], [place] [address]     Report Status PENDING  Incomplete  SARS Coronavirus 2 by RT PCR (hospital order, performed in [facility] hospital lab) *cepheid single result test* Anterior Nasal Swab     Status: None   Collection Time: 07/29/2022  8:49 AM   Specimen: Anterior Nasal Swab  Result Value Ref Range Status   SARS Coronavirus 2 by RT PCR NEGATIVE NEGATIVE Final    Comment: (NOTE) SARS-CoV-2 target nucleic acids are NOT DETECTED.  The SARS-CoV-2 RNA is generally detectable in upper and lower respiratory specimens during the acute phase of infection. The lowest concentration of SARS-CoV-2 viral copies this assay can detect is 250 copies / mL. A negative result does not preclude SARS-CoV-2 infection and should not  be used as the sole basis for treatment or other patient management decisions.  A negative result may occur with improper specimen collection / handling, submission of specimen other than nasopharyngeal swab, presence of viral mutation(s) within the areas targeted by this assay, and inadequate number of viral copies (<250 copies / mL). A negative result must be combined with clinical observations, patient history, and epidemiological information.  Fact Sheet for Patients:   https://www.patel.info/  Fact Sheet for Healthcare Providers: https://hall.com/  This test is not yet approved or  cleared by the [place] FDA and has been authorized for detection and/or diagnosis of SARS-CoV-2 by FDA under an Emergency Use Authorization (EUA).  This EUA will remain in effect (meaning this test can be used) for the duration of the COVID-19 declaration under Section 564(b)(1) of the Act, 21 U.S.C. section 360bbb-3(b)(1), unless the authorization is terminated or revoked sooner.  Performed at [facility], [place] [address], [place], [place] [address]   MRSA Next Gen by PCR, Nasal     Status: None   Collection Time: 08/07/2022  2:17 PM   Specimen: Nasal  Mucosa; Nasal Swab  Result Value Ref Range Status   MRSA by PCR Next Gen NOT DETECTED NOT DETECTED Final    Comment: (NOTE) The GeneXpert MRSA Assay (FDA approved for NASAL specimens only), is one component of a comprehensive MRSA colonization surveillance program. It is not intended to diagnose MRSA infection nor to guide or monitor treatment for MRSA infections. Test performance is not FDA approved in patients less than 56 years old. Performed at [facility], [place] [address] [place], [place] [address]     Lab Basic Metabolic Panel: Recent Labs  Lab 07/30/2022 [phone] 08/12/2022 [phone] 08/15/2022 0828 07/25/2022 1021 08/12/2022 1406 08/10/2022 1559 07/24/22 0438 07/24/22 0542 07/25/22 0422 07/25/22 0523 07/25/22 1049 07/25/22 1943 07/25/22 2157 07/25/22 2331 20-Aug-2022 0519  NA 135   < > 131*   < > 134*   < > 131*   < > 126*   < > 122* 120* 121* 120* 126*  K 5.2*   < > 5.0   < > 5.3*   < > 5.0   < > 4.9   < > 4.3 4.7 4.6 4.6 4.9  CL 95*  --  102  --  96*  --  83*  --  71*  --   --   --   --   --  <65*  CO2 10*  --   --   --  14*  --  16*  --  16*  --   --   --   --   --  27  GLUCOSE 71  --  64*  --  63*  --  217*  --  240*  --   --   --   --   --  111*  BUN 96*  --  113*  --  90*  --  85*  --  80*  --   --   --   --   --  79*  CREATININE 6.44*  --  6.70*  --  5.80*  --  5.31*  --  5.09*  --   --   --   --   --  4.69*  CALCIUM 9.4  --   --   --  7.8*  --  7.6*  --  7.2*  --   --   --   --   --  6.4*  MG  --   --   --   --   --   --   --   --   --   --   --   --   --   --  1.5*  PHOS  --   --   --   --   --   --   --   --   --   --   --   --   --   --  7.4*   < > = values in this interval not displayed.   Liver Function Tests: Recent Labs  Lab 08/12/2022 1406 07/25/22 0422 08-15-2022 0519  AST 128* 173* 205*  ALT 61* 83* 80*  ALKPHOS 172* 168* 200*  BILITOT 0.7 0.6 0.9  PROT 5.1* 4.2* 4.1*  ALBUMIN 2.0* 1.7* 1.6*   No results for input(s): "LIPASE", "AMYLASE" in the last 168  hours. No results for input(s): "AMMONIA" in the last 168 hours. CBC: Recent Labs  Lab 08/13/2022 0807 08/06/2022 0814 07/24/22 0438 07/24/22 0542 07/25/22 0422 07/25/22 0523 07/25/22 1049 07/25/22 1943 07/25/22 2157 07/25/22 2331 08/15/2022 0519  WBC 19.8*  --  24.9*  --  23.9*  --   --   --   --   --  21.3*  NEUTROABS 16.5*  --   --   --   --   --   --   --   --   --   --   HGB 13.9   < > 11.3*   < > 9.1*   < > 9.2* 9.2* 8.5* 8.5* 8.9*  HCT 41.4   < > 33.2*   < > 25.4*   < > 27.0* 27.0* 25.0* 25.0* 24.7*  MCV 86.4  --  84.1  --  83.3  --   --   --   --   --  79.7*  PLT 365  --  295  --  186  --   --   --   --   --  173   < > = values in this interval not displayed.   Cardiac Enzymes: No results for input(s): "CKTOTAL", "CKMB", "CKMBINDEX", "TROPONINI" in the last 168 hours. Sepsis Labs: Recent Labs  Lab 07/28/2022 0807 08/07/2022 1034 07/27/2022 1406 08/17/2022 1636 07/24/22 0438 07/24/22 1007 07/24/22 2149 07/25/22 0422 07/25/22 0901 07/25/22 1200 2022/08/15 0519  PROCALCITON  --   --  44.06  --   --   --   --   --   --   --   --   WBC 19.8*  --   --   --  24.9*  --   --  23.9*  --   --  21.3*  LATICACIDVEN >9.0*   < >  --    < >  --  >9.0* >9.0*  --  >9.0* >9.0*  --    < > = values in this interval not displayed.    Procedures/Operations     [employer] Aug 15, 2022, 4:40 PM

## 2022-08-22 NOTE — Consult Note (Addendum)
Consultation Note Date: Aug 12, 2022   Patient Name: Latoya [name] [facility]  DOB: 07-31-1946  MRN: [id number]  Age / Sex: 76 y.o., female  PCP: Copland, Gay Filler, MD Referring Physician: Jacky Kindle, MD  Reason for Consultation: Establishing goals of care  HPI/Patient Profile: 76 y.o. female  with past medical history of hemangioma of liver, hypothyroidism, anemia, HLD, HTN, CAD, GERD, PAD, PE on AC, A-fib, mitral vegetation, DVT, and chronic pain admitted on 07/28/2022 with AMS and hypoxemia.  Patient required intubation due to acute hypoxic respiratory failure.  Patient diagnosed with pneumonia and left leg cellulitis.  Patient with persistent lactic acidosis.  Patient requiring vasopressors.  Patient with AKI potentially related to ATN secondary to septic shock.  Patient remains an uric.  PMT consulted to discuss goals of care.  Clinical Assessment and Goals of Care: I have reviewed medical records including EPIC notes, labs and imaging, received report from RN and Dr Tacy Learn, assessed the patient and then met with patient's spouse Latoya [name], nieces [name] and [name]  to discuss diagnosis prognosis, GOC, EOL wishes, disposition and options.  I introduced Palliative Medicine as specialized medical care for people living with serious illness. It focuses on providing relief from the symptoms and stress of a serious illness. The goal is to improve quality of life for both the patient and the family.  We discussed a brief life review of the patient. 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 She and Latoya [name] have been married over 22 years. Patient does not have children. She loved to sing in the choir. She is very close to her family.   As far as functional family tells me of a decline. She could ambulate with assistance but had become much less mobile over the past year.    We discussed patient's current illness and what it means in the larger context  of patient's on-going co-morbidities.  Natural disease trajectory and expectations at EOL were discussed. We discussed patient's respiratory failure and kidney failure. We discussed difficulties with nutrition.   I attempted to elicit values and goals of care important to the patient. Family tells me patient would not want her life prolonged in this particular situation given poor prognosis.    The difference between aggressive medical intervention and comfort care was considered in light of the patient's goals of care. Family all agrees they are ready to transition to comfort focused measures.   We discussed what to anticipate as we proceed with freeing patient from medical equipment. We discussed anticipated hospital death. Family understands and agrees.   Questions and concerns were addressed. The family was encouraged to call with questions or concerns.    Primary Decision Maker NEXT OF KIN - spouse [name]    SUMMARY OF RECOMMENDATIONS   - proceed with extubation, full comfort measures - use medications to promote comfort - anticipate hospital death  Code Status/Advance Care Planning: DNR  Additional Recommendations (Limitations, Scope, Preferences): Full Comfort Care  Prognosis:  Hours - Days  Discharge Planning: Anticipated Hospital Death      Primary Diagnoses: Present on Admission:  Pneumonia   I have reviewed the medical record, interviewed the patient and family, and examined the patient. The following aspects are pertinent.  Past Medical History:  Diagnosis Date   Allergy    seasonal   Anemia, iron deficiency    Antiphospholipid syndrome ([place])    with hypercoagulable state   Asthma    extrinsic; moderate, persistant, nml spirometry 2010, nml CXR  1/08   Atrial fibrillation (HCC)    Bladder troubles    REPORTS INFECTIONS ON OCCASION DUE TO URETHRA MEATUS STRICTURE AT BIRTH    CAD (coronary artery disease)    a. 50% mid LAD, 80% ostial D1 and moderate  80% mid circ by cath 2003. b. nonischemic nuc in 2013.   Cancer ([place])    basal cell removed fr. L arm    Carotid arterial disease ([place])    a. 03/2017 showed 1-39% bilateral internal carotid stenosis (high end range on left), >50% LECA stenosis, with recommendation for f/u duplex 03/2018.   CHF (congestive heart failure) (HCC)    Clotting disorder (HCC)    Complication of anesthesia    cardiac arrest- in OR, at age 17 (40)y.o. during Scalenotomy in her early 20's   Diabetes [facility])    Drug allergy    heparin/lovenox   DVT (deep venous thrombosis) (HCC)    Erosive gastritis 1994   Family history of adverse reaction to anesthesia    some family members have had trouble waking up   GERD (gastroesophageal reflux disease)    H/O hiatal hernia    Heart murmur    atrial fib, controlled   History of colon polyps    HLD (hyperlipidemia)    HTN (hypertension)    Pt states her high blood pressure readings are due tot he method of measurements[facility]Alhaney at [employer], manages pt.  LOV 03/2014   Hypothyroidism    Liver spot    cyst- - no biopsy, but told that its benign    Moderate mitral regurgitation    PAF (paroxysmal atrial fibrillation) ([place])    Pulmonary embolism ([place])    related to back surgery with prior coumadin use, now off   PVC's (premature ventricular contractions)    a. noted in 09/2017.   Septic shock (HCC)    Spondylosis, lumbosacral    ARTHRITIS- OA    Urinary incontinence    Social History   Socioeconomic History   Marital status: Married    Spouse name: Not on file   Number of children: Not on file   Years of education: Not on file   Highest education level: Not on file  Occupational History   Not on file  Tobacco Use   Smoking status: Never   Smokeless tobacco: Never   Tobacco comments:    no smoking   Vaping Use   Vaping Use: Never used  Substance and Sexual Activity   Alcohol use: No   Drug use: No   Sexual activity: Not Currently  Other Topics Concern    Not on file  Social History Narrative   Lives in [place] with husband; has had miscarriages but no children.    Disabled but exercises nearly every day (can only exericse in water - aerobics)   Takes opioids for pain relief; takes no herbal medications; has a heart healthy diet.    Social Determinants of Health   Financial Resource Strain: Low Risk  (08/28/2021)   Overall Financial Resource Strain (CARDIA)    Difficulty of Paying Living Expenses: Not hard at all  Food Insecurity: No Food Insecurity (08/28/2021)   Hunger Vital Sign    Worried About Running Out of Food in the Last Year: Never true    Ran Out of Food in the Last Year: Never true  Transportation Needs: No Transportation Needs (08/28/2021)   PRAPARE - Hydrologist (Medical): No    Lack of Transportation (Non-Medical):  No  Physical Activity: Inactive (08/28/2021)   Exercise Vital Sign    Days of Exercise per Week: 0 days    Minutes of Exercise per Session: 0 min  Stress: No Stress Concern Present (08/28/2021)   [place]    Feeling of Stress : Not at all  Social Connections: Not on file   Family History  Problem Relation Age of Onset   Heart disease Father        MI    Heart disease Sister    Colon cancer Sister    Colon cancer Cousin        a guy-on father side    Colon cancer Paternal Aunt        great aunt    Colon polyps Neg Hx    Esophageal cancer Neg Hx    Rectal cancer Neg Hx    Stomach cancer Neg Hx    Scheduled Meds:  artificial tears   Both Eyes Q8H   hydrocortisone sod succinate (SOLU-CORTEF) inj  100 mg Intravenous Q12H   insulin aspart  0-9 Units Subcutaneous Q4H   ipratropium-albuterol  3 mL Nebulization Q6H   mouth rinse  15 mL Mouth Rinse Q2H   pantoprazole (PROTONIX) IV  40 mg Intravenous Q24H   sodium chloride flush  10-40 mL Intracatheter Q12H   Continuous Infusions:  sodium chloride Stopped  (August 12, 2022 1148)   amiodarone 30 mg/hr (08-12-22 1229)   argatroban 0.098 mcg/kg/min (08/12/2022 1200)   cefTRIAXone (ROCEPHIN)  IV Stopped (08/12/2022 8841)   clindamycin (CLEOCIN) IV Stopped (12-Aug-2022 1036)   fentaNYL infusion INTRAVENOUS 75 mcg/hr (08-12-22 1200)   norepinephrine (LEVOPHED) Adult infusion 6 mcg/min (2022/08/12 1200)   propofol (DIPRIVAN) infusion 14.996 mcg/kg/min (12-Aug-2022 1200)   thiamine (VITAMIN B1) injection Stopped (08/12/22 1147)   vasopressin Stopped (August 12, 2022 0707)   PRN Meds:.dextrose, docusate, etomidate, fentaNYL, ipratropium-albuterol, mouth rinse, polyethylene glycol, sodium chloride flush Allergies  Allergen Reactions   Chlorhexidine Anaphylaxis, Itching and Other (See Comments)    Sensitive to dye in Betadine & Chlorohexadine    Enoxaparin Sodium Other (See Comments)    Hx anaphylaxis to pork   Heparin Other (See Comments)    Hx anaphylaxis to pork   Hornet Venom Anaphylaxis and Other (See Comments)    European Hornets   Pork-Derived Products Anaphylaxis   Povidone-Iodine Itching, Rash and Other (See Comments)    NOTE: Patient has had anaphylactic reaction to Betadine due to dyes in product. Sensitivity-" but if its wiped off she is able to tolerate betadine"    Indomethacin Rash   Lactose Intolerance (Gi) Diarrhea, Nausea And Vomiting and Other (See Comments)    Can tolerate most [place] cheeses (Takes Lactaid)   Phenylpropanolamine Hypertension   Review of Systems  Unable to perform ROS: Intubated    Physical Exam Constitutional:      General: She is not in acute distress.    Appearance: She is ill-appearing.     Comments: sedated  Cardiovascular:     Rate and Rhythm: Tachycardia present. Rhythm irregular.  Pulmonary:     Comments: intubated Musculoskeletal:     Right lower leg: Edema present.     Left lower leg: Edema present.  Skin:    General: Skin is warm and dry.    Vital Signs: BP (!) 90/58   Pulse (!) 126   Temp 98.2 F (36.8  C)   Resp (!) 26   Ht '5\' 2"'  (1.575 m)  Wt 91.9 kg   SpO2 91%   BMI 37.06 kg/m  Pain Scale: CPOT   Pain Score: 0-No pain   SpO2: SpO2: 91 % O2 Device:SpO2: 91 % O2 Flow Rate: .O2 Flow Rate (L/min): 15 L/min  IO: Intake/output summary:  Intake/Output Summary (Last 24 hours) at August 23, 2022 1249 Last data filed at 08-23-2022 1200 Gross per 24 hour  Intake 7826.4 ml  Output 100 ml  Net 7726.4 ml    LBM: Last BM Date :  (UTA) Baseline Weight: Weight: 74.8 kg Most recent weight: Weight: 91.9 kg      Time in: 1445 Time Out 1615 Total time 90 minutes   *Please note that this is a verbal dictation therefore any spelling or grammatical errors are due to the "[place] [employer]" system interpretation.   Juel Burrow, DNP, AGNP-C Palliative Medicine Team [phone] Pager: [phone]

## 2022-08-22 NOTE — Progress Notes (Signed)
Inpatient Diabetes Program Recommendations  AACE/ADA: New Consensus Statement on Inpatient Glycemic Control (2015)  Target Ranges:  Prepandial:   less than 140 mg/dL      Peak postprandial:   less than 180 mg/dL (1-2 hours)      Critically ill patients:  140 - 180 mg/dL   Lab Results  Component Value Date   GLUCAP 117 (H) 2022/08/24   HGBA1C 7.8 (H) 04/16/2022    Review of Glycemic Control  Latest Reference Range & Units 07/25/22 20:23 07/25/22 21:55 07/25/22 23:26 08/24/22 05:19  Glucose-Capillary 70 - 99 mg/dL 117 (H) 97 91 117 (H)  Diabetes history: DM  Outpatient Diabetes medications:  Metformin 1000 mg bid Current orders for Inpatient glycemic control:  Novolog 0-20 units q 4 hours Inpatient Diabetes Program Recommendations:    Please consider reducing Novolog to sensitive 0-9 units q 4 hours.   Thanks,  Adah Perl, RN, BC-ADM Inpatient Diabetes Coordinator Pager [phone]  (8a-5p)

## 2022-08-22 NOTE — Progress Notes (Signed)
Family was brought to bedside to discuss goals of care. They proceeded to move forward with comfort care. Patient expired at 1630. Family was at bedside and MD notified. NO belongings were with patient.

## 2022-08-22 DEATH — deceased

## 2022-10-17 ENCOUNTER — Ambulatory Visit: Payer: Medicare Other | Admitting: Family Medicine
